# Patient Record
Sex: Female | Born: 1970 | Race: Black or African American | Hispanic: No | Marital: Single | Smoking: Former smoker
Health system: Southern US, Community
[De-identification: ages and names within clinical notes are randomized; demographics above are authoritative.]

## PROBLEM LIST (undated history)

## (undated) DIAGNOSIS — J45909 Unspecified asthma, uncomplicated: Secondary | ICD-10-CM

## (undated) DIAGNOSIS — R0600 Dyspnea, unspecified: Secondary | ICD-10-CM

## (undated) DIAGNOSIS — M1990 Unspecified osteoarthritis, unspecified site: Secondary | ICD-10-CM

## (undated) DIAGNOSIS — M549 Dorsalgia, unspecified: Secondary | ICD-10-CM

## (undated) DIAGNOSIS — M7918 Myalgia, other site: Secondary | ICD-10-CM

## (undated) DIAGNOSIS — C50919 Malignant neoplasm of unspecified site of unspecified female breast: Secondary | ICD-10-CM

## (undated) DIAGNOSIS — Z923 Personal history of irradiation: Secondary | ICD-10-CM

## (undated) DIAGNOSIS — I10 Essential (primary) hypertension: Secondary | ICD-10-CM

## (undated) DIAGNOSIS — Z9221 Personal history of antineoplastic chemotherapy: Secondary | ICD-10-CM

## (undated) HISTORY — DX: Dorsalgia, unspecified: M54.9

## (undated) HISTORY — PX: APPENDECTOMY: SHX54

## (undated) HISTORY — DX: Myalgia, other site: M79.18

## (undated) HISTORY — PX: ABDOMINAL HYSTERECTOMY: SHX81

---

## 2010-10-27 ENCOUNTER — Emergency Department (HOSPITAL_COMMUNITY): Admission: EM | Admit: 2010-10-27 | Discharge: 2010-10-27 | Payer: Self-pay | Admitting: Emergency Medicine

## 2012-10-04 ENCOUNTER — Other Ambulatory Visit (HOSPITAL_COMMUNITY): Payer: Self-pay | Admitting: *Deleted

## 2012-10-04 DIAGNOSIS — Z139 Encounter for screening, unspecified: Secondary | ICD-10-CM

## 2012-10-13 ENCOUNTER — Ambulatory Visit (HOSPITAL_COMMUNITY)
Admission: RE | Admit: 2012-10-13 | Discharge: 2012-10-13 | Disposition: A | Discharge status: Home / Self Care | Payer: Medicaid Other | Source: Ambulatory Visit | Attending: *Deleted | Admitting: *Deleted

## 2012-10-13 DIAGNOSIS — Z139 Encounter for screening, unspecified: Secondary | ICD-10-CM

## 2012-10-13 DIAGNOSIS — Z1231 Encounter for screening mammogram for malignant neoplasm of breast: Secondary | ICD-10-CM | POA: Insufficient documentation

## 2013-09-10 ENCOUNTER — Other Ambulatory Visit (HOSPITAL_COMMUNITY): Payer: Self-pay | Admitting: *Deleted

## 2013-09-10 DIAGNOSIS — Z139 Encounter for screening, unspecified: Secondary | ICD-10-CM

## 2013-10-15 ENCOUNTER — Ambulatory Visit (HOSPITAL_COMMUNITY)
Admission: RE | Admit: 2013-10-15 | Discharge: 2013-10-15 | Disposition: A | Discharge status: Home / Self Care | Payer: Medicaid Other | Source: Ambulatory Visit | Attending: *Deleted | Admitting: *Deleted

## 2013-10-15 DIAGNOSIS — Z1231 Encounter for screening mammogram for malignant neoplasm of breast: Secondary | ICD-10-CM | POA: Insufficient documentation

## 2013-10-15 DIAGNOSIS — Z139 Encounter for screening, unspecified: Secondary | ICD-10-CM

## 2013-10-20 ENCOUNTER — Encounter (HOSPITAL_COMMUNITY): Payer: Self-pay | Admitting: Emergency Medicine

## 2013-10-20 ENCOUNTER — Emergency Department (HOSPITAL_COMMUNITY)
Admission: EM | Admit: 2013-10-20 | Discharge: 2013-10-20 | Disposition: A | Discharge status: Home / Self Care | Payer: Medicaid Other | Attending: Emergency Medicine | Admitting: Emergency Medicine

## 2013-10-20 DIAGNOSIS — M5431 Sciatica, right side: Secondary | ICD-10-CM

## 2013-10-20 DIAGNOSIS — I10 Essential (primary) hypertension: Secondary | ICD-10-CM | POA: Insufficient documentation

## 2013-10-20 DIAGNOSIS — F17200 Nicotine dependence, unspecified, uncomplicated: Secondary | ICD-10-CM | POA: Insufficient documentation

## 2013-10-20 DIAGNOSIS — M5430 Sciatica, unspecified side: Secondary | ICD-10-CM | POA: Insufficient documentation

## 2013-10-20 HISTORY — DX: Essential (primary) hypertension: I10

## 2013-10-20 MED ORDER — CYCLOBENZAPRINE HCL 10 MG PO TABS
10.0000 mg | ORAL_TABLET | Freq: Once | ORAL | Status: AC
  Administered 2013-10-20: 10 mg via ORAL
  Filled 2013-10-20: qty 1

## 2013-10-20 MED ORDER — CYCLOBENZAPRINE HCL 10 MG PO TABS: 10.0000 mg | ORAL_TABLET | Freq: Two times a day (BID) | ORAL | Status: DC | PRN

## 2013-10-20 NOTE — ED Provider Notes (Signed)
CSN: [id number]     Arrival date & time 10/20/13  1157 History   First MD Initiated Contact with Patient 10/20/13 1216     Chief Complaint  Patient presents with  . Leg Pain   (Consider location/radiation/quality/duration/timing/severity/associated sxs/prior Treatment) Patient is a 42 y.o. female presenting with leg pain. The history is provided by the patient.  Leg Pain Location:  Buttock Time since incident:  2 weeks Injury: no   Buttock location:  L buttock and R buttock Pain details:    Radiates to:  L leg and R leg   Severity:  Moderate   Onset quality:  Gradual   Duration:  2 weeks   Timing:  Constant   Progression:  Worsening Chronicity:  New Dislocation: no   Foreign body present:  No foreign bodies Prior injury to area:  No Relieved by:  None tried Worsened by:  Activity and bearing weight Ineffective treatments:  None tried Associated symptoms: back pain   Associated symptoms: no decreased ROM    Monica [name] is a 42 y.o. female who presents to the ED with bilateral buttock pain that radiates to the legs. She saw her PCP yesterday and is to see a specialist. They are to call her on Monday with the arrangement.   Past Medical History  Diagnosis Date  . Hypertension    Past Surgical History  Procedure Laterality Date  . Abdominal hysterectomy     History reviewed. No pertinent family history. History  Substance Use Topics  . Smoking status: Current Every Day Smoker -- 0.50 packs/day  . Smokeless tobacco: Not on file  . Alcohol Use: No   OB History   Grav Para Term Preterm Abortions TAB SAB Ect Mult Living                 Review of Systems  Musculoskeletal: Positive for back pain.   Negative except as stated in HPI Allergies  Review of patient's allergies indicates no known allergies.  Home Medications  No current outpatient prescriptions on file. BP 107/80  Pulse 87  Temp(Src) 98.9 F (37.2 C) (Oral)  Wt 183 lb (83.008 kg)  SpO2  100% Physical Exam  Nursing note and vitals reviewed. Constitutional: She is oriented to person, place, and time. She appears well-developed and well-nourished.  HENT:  Head: Normocephalic and atraumatic.  Eyes: Conjunctivae and EOM are normal. Pupils are equal, round, and reactive to light.  Neck: Normal range of motion. Neck supple.  Cardiovascular: Normal rate and regular rhythm.   Pulmonary/Chest: Effort normal and breath sounds normal.  Abdominal: Soft. There is no tenderness.  Musculoskeletal:       Lumbar back: She exhibits spasm. She exhibits normal range of motion.       Back:  Tender with palpation bilateral sciatic nerve area with radiation to the posterior thighs. Pedal pulses equal, adequate circulation, good touch sensation.  Neurological: She is alert and oriented to person, place, and time. She has normal strength and normal reflexes. No cranial nerve deficit or sensory deficit. Gait normal.  Skin: Skin is warm and dry.  Psychiatric: She has a normal mood and affect. Her behavior is normal.    ED Course  Procedures  After Flexeril 10 mg PO the patient states she is pain free and can walk without difficulty.  MDM  42 y.o. female with sciatica. Will treat with muscle relaxant and she will take ibuprofen. She will follow up as scheduled.  Discussed with the patient and all  questioned fully answered. She will return if any problems arise.    Medication List         cyclobenzaprine 10 MG tablet  Commonly known as:  FLEXERIL  Take 1 tablet (10 mg total) by mouth 2 (two) times daily as needed for muscle spasms.           Janne Napoleon, NP 10/21/13 [phone]

## 2013-10-20 NOTE — ED Notes (Signed)
Bilateral leg pain times 2 weeks.  Seen family doctor Friday.  Pain is worse now.

## 2013-10-23 NOTE — ED Provider Notes (Signed)
Medical screening examination/treatment/procedure(s) were performed by non-physician practitioner and as supervising physician I was immediately available for consultation/collaboration.  EKG Interpretation   None         Yoel Kaufhold L Amear Strojny, MD 10/23/13 1108 

## 2014-08-02 ENCOUNTER — Other Ambulatory Visit (HOSPITAL_COMMUNITY): Payer: Self-pay | Admitting: *Deleted

## 2014-08-02 DIAGNOSIS — Z1231 Encounter for screening mammogram for malignant neoplasm of breast: Secondary | ICD-10-CM

## 2014-10-16 ENCOUNTER — Ambulatory Visit (HOSPITAL_COMMUNITY)
Admission: RE | Admit: 2014-10-16 | Discharge: 2014-10-16 | Disposition: A | Discharge status: Home / Self Care | Payer: Medicaid Other | Source: Ambulatory Visit | Attending: *Deleted | Admitting: *Deleted

## 2014-10-16 DIAGNOSIS — Z1231 Encounter for screening mammogram for malignant neoplasm of breast: Secondary | ICD-10-CM | POA: Insufficient documentation

## 2015-04-15 ENCOUNTER — Other Ambulatory Visit (HOSPITAL_COMMUNITY)
Admission: RE | Admit: 2015-04-15 | Discharge: 2015-04-15 | Disposition: A | Discharge status: Home / Self Care | Payer: Medicaid Other | Source: Ambulatory Visit | Attending: Unknown Physician Specialty | Admitting: Unknown Physician Specialty

## 2015-04-15 DIAGNOSIS — R87619 Unspecified abnormal cytological findings in specimens from cervix uteri: Secondary | ICD-10-CM | POA: Insufficient documentation

## 2015-04-18 LAB — CYTOLOGY - PAP

## 2015-08-14 ENCOUNTER — Encounter (HOSPITAL_COMMUNITY): Payer: Self-pay | Admitting: Emergency Medicine

## 2015-08-14 ENCOUNTER — Emergency Department (HOSPITAL_COMMUNITY)
Admission: EM | Admit: 2015-08-14 | Discharge: 2015-08-14 | Disposition: A | Discharge status: Home / Self Care | Payer: Medicaid Other | Attending: Emergency Medicine | Admitting: Emergency Medicine

## 2015-08-14 DIAGNOSIS — I10 Essential (primary) hypertension: Secondary | ICD-10-CM | POA: Insufficient documentation

## 2015-08-14 DIAGNOSIS — Z7982 Long term (current) use of aspirin: Secondary | ICD-10-CM | POA: Insufficient documentation

## 2015-08-14 DIAGNOSIS — Z79899 Other long term (current) drug therapy: Secondary | ICD-10-CM | POA: Insufficient documentation

## 2015-08-14 DIAGNOSIS — H9201 Otalgia, right ear: Secondary | ICD-10-CM | POA: Insufficient documentation

## 2015-08-14 DIAGNOSIS — Z720 Tobacco use: Secondary | ICD-10-CM | POA: Insufficient documentation

## 2015-08-14 MED ORDER — NEOMYCIN-POLYMYXIN-HC 1 % OT SOLN
4.0000 [drp] | Freq: Once | OTIC | Status: AC
  Administered 2015-08-14: 4 [drp] via OTIC
  Filled 2015-08-14: qty 10

## 2015-08-14 MED ORDER — ACETAMINOPHEN-CODEINE #3 300-30 MG PO TABS: 1.0000 | ORAL_TABLET | Freq: Four times a day (QID) | ORAL | Status: DC | PRN

## 2015-08-14 MED ORDER — CIPROFLOXACIN HCL 250 MG PO TABS
500.0000 mg | ORAL_TABLET | Freq: Once | ORAL | Status: AC
  Administered 2015-08-14: 500 mg via ORAL
  Filled 2015-08-14: qty 2

## 2015-08-14 MED ORDER — CIPROFLOXACIN HCL 500 MG PO TABS: 500.0000 mg | ORAL_TABLET | Freq: Two times a day (BID) | ORAL | Status: DC

## 2015-08-14 NOTE — ED Notes (Signed)
Pt verbalized understanding of no driving and to use caution within 4 hours of taking pain meds due to meds cause drowsiness 

## 2015-08-14 NOTE — ED Notes (Signed)
Having right ear pain, rates pain 8/10.

## 2015-08-14 NOTE — ED Provider Notes (Signed)
CSN: [id number]     Arrival date & time 08/14/15  1636 History   First MD Initiated Contact with Patient 08/14/15 1811     Chief Complaint  Patient presents with  . Otalgia    right     (Consider location/radiation/quality/duration/timing/severity/associated sxs/prior Treatment) HPI Comments: Patient presents to the emergency department with right ear pain. The patient complains of pain just outside the tragus area. She denies any recent injury or trauma. She states she has not been using excessive Q-tips. She has not been swimming. She's not had drainage from the ear. She states that it is probably been 8 or 9 years that she's had any form of an ear type infection. She denies recent sore throat, runny nose, or upper respiratory symptoms.  The history is provided by the patient.    Past Medical History  Diagnosis Date  . Hypertension    Past Surgical History  Procedure Laterality Date  . Abdominal hysterectomy     History reviewed. No pertinent family history. Social History  Substance Use Topics  . Smoking status: Current Every Day Smoker -- 0.50 packs/day  . Smokeless tobacco: None  . Alcohol Use: No   OB History    No data available     Review of Systems  HENT: Positive for ear pain.   All other systems reviewed and are negative.     Allergies  Review of patient's allergies indicates no known allergies.  Home Medications   Prior to Admission medications   Medication Sig Start Date End Date Taking? Authorizing Provider  aspirin EC 81 MG tablet Take 81 mg by mouth daily.   Yes Historical Provider, MD  lisinopril (PRINIVIL,ZESTRIL) 20 MG tablet Take 20 mg by mouth daily.   Yes Historical Provider, MD  cyclobenzaprine (FLEXERIL) 10 MG tablet Take 1 tablet (10 mg total) by mouth 2 (two) times daily as needed for muscle spasms. Patient not taking: Reported on 08/14/2015 10/20/13   Hope Bunnie Pion, NP   BP 137/92 mmHg  Pulse 72  Temp(Src) 98.5 F (36.9 C) (Oral)  Resp  18  Ht 5\' 3"  (1.6 m)  Wt 170 lb (77.111 kg)  BMI 30.12 kg/m2  SpO2 100% Physical Exam  Constitutional: She is oriented to person, place, and time. She appears well-developed and well-nourished.  Non-toxic appearance.  HENT:  Head: Normocephalic.  Right Ear: Tympanic membrane and external ear normal.  Left Ear: Tympanic membrane and external ear normal.  There is slight increased redness of the inner aspect of the right tragus. There is tenderness to touch of the tragus and adjacent to the tragus area. There is no swelling or tenderness near the salivary glands. There is no postauricular nodes appreciated. There is slight increase swelling of the external auditory canal. The right postauricular area, preauricular area, and external auditory canal are well within normal limits.  Eyes: EOM and lids are normal. Pupils are equal, round, and reactive to light.  Neck: Normal range of motion. Neck supple. Carotid bruit is not present.  Cardiovascular: Normal rate, regular rhythm, normal heart sounds, intact distal pulses and normal pulses.   Pulmonary/Chest: Breath sounds normal. No respiratory distress.  Abdominal: Soft. Bowel sounds are normal. There is no tenderness. There is no guarding.  Musculoskeletal: Normal range of motion.  Lymphadenopathy:       Head (right side): No submandibular adenopathy present.       Head (left side): No submandibular adenopathy present.    She has no cervical adenopathy.  Neurological: She is alert and oriented to person, place, and time. She has normal strength. No cranial nerve deficit or sensory deficit.  Skin: Skin is warm and dry.  Psychiatric: She has a normal mood and affect. Her speech is normal.  Nursing note and vitals reviewed.   ED Course  Procedures (including critical care time) Labs Review Labs Reviewed - No data to display  Imaging Review No results found. I have personally reviewed and evaluated these images and lab results as part of my  medical decision-making.   EKG Interpretation None      MDM  Vital signs are well within normal limits. There appears to be an infection of the outer portion of the ear involving the tragus, and portion of the external auditory canal. The patient is having 8/10 pain. Will treat with Tylenol codeine and Cipro. Patient is also given Cortisporin otic suspension to use for the next 5-7 days.    Final diagnoses:  None    *I have reviewed nursing notes, vital signs, and all appropriate lab and imaging results for this patient.[address] PA-C 08/14/15 1928  Noemi Chapel, MD 08/15/15 [phone]

## 2015-08-14 NOTE — Discharge Instructions (Signed)
Please use four  Cortisporin ear drops to the right ear 4 times daily. Please use Cipro 2 times daily with food. Use Tylenol or ibuprofen for mild pain, use Tylenol codeine for more severe pain.

## 2015-09-19 ENCOUNTER — Other Ambulatory Visit (HOSPITAL_COMMUNITY): Payer: Self-pay | Admitting: *Deleted

## 2015-10-21 ENCOUNTER — Other Ambulatory Visit (HOSPITAL_COMMUNITY): Payer: Self-pay | Admitting: *Deleted

## 2015-10-21 DIAGNOSIS — Z1231 Encounter for screening mammogram for malignant neoplasm of breast: Secondary | ICD-10-CM

## 2015-11-10 ENCOUNTER — Ambulatory Visit (HOSPITAL_COMMUNITY)
Admission: RE | Admit: 2015-11-10 | Discharge: 2015-11-10 | Disposition: A | Discharge status: Home / Self Care | Payer: PRIVATE HEALTH INSURANCE | Source: Ambulatory Visit | Attending: *Deleted | Admitting: *Deleted

## 2015-11-10 DIAGNOSIS — Z1231 Encounter for screening mammogram for malignant neoplasm of breast: Secondary | ICD-10-CM | POA: Diagnosis present

## 2016-10-19 ENCOUNTER — Other Ambulatory Visit (HOSPITAL_COMMUNITY): Payer: Self-pay | Admitting: *Deleted

## 2016-10-19 DIAGNOSIS — Z1231 Encounter for screening mammogram for malignant neoplasm of breast: Secondary | ICD-10-CM

## 2016-11-11 ENCOUNTER — Ambulatory Visit (HOSPITAL_COMMUNITY)
Admission: RE | Admit: 2016-11-11 | Discharge: 2016-11-11 | Disposition: A | Discharge status: Home / Self Care | Payer: PRIVATE HEALTH INSURANCE | Source: Ambulatory Visit | Attending: *Deleted | Admitting: *Deleted

## 2016-11-11 DIAGNOSIS — Z1231 Encounter for screening mammogram for malignant neoplasm of breast: Secondary | ICD-10-CM

## 2017-08-01 ENCOUNTER — Emergency Department (HOSPITAL_COMMUNITY)
Admission: EM | Admit: 2017-08-01 | Discharge: 2017-08-01 | Disposition: A | Discharge status: Home / Self Care | Payer: PRIVATE HEALTH INSURANCE | Attending: Emergency Medicine | Admitting: Emergency Medicine

## 2017-08-01 ENCOUNTER — Encounter (HOSPITAL_COMMUNITY): Payer: Self-pay | Admitting: Adult Health

## 2017-08-01 DIAGNOSIS — F17210 Nicotine dependence, cigarettes, uncomplicated: Secondary | ICD-10-CM | POA: Insufficient documentation

## 2017-08-01 DIAGNOSIS — J40 Bronchitis, not specified as acute or chronic: Secondary | ICD-10-CM

## 2017-08-01 DIAGNOSIS — Z79899 Other long term (current) drug therapy: Secondary | ICD-10-CM | POA: Insufficient documentation

## 2017-08-01 MED ORDER — PREDNISONE 20 MG PO TABS: 40.0000 mg | ORAL_TABLET | Freq: Every day | ORAL | 0 refills | Status: DC

## 2017-08-01 MED ORDER — AZITHROMYCIN 250 MG PO TABS: ORAL_TABLET | ORAL | 0 refills | Status: DC

## 2017-08-01 MED ORDER — ALBUTEROL SULFATE (2.5 MG/3ML) 0.083% IN NEBU
2.5000 mg | INHALATION_SOLUTION | Freq: Once | RESPIRATORY_TRACT | Status: AC
Start: 2017-08-01 — End: 2017-08-01
  Administered 2017-08-01: 2.5 mg via RESPIRATORY_TRACT
  Filled 2017-08-01: qty 3

## 2017-08-01 MED ORDER — ALBUTEROL SULFATE HFA 108 (90 BASE) MCG/ACT IN AERS
2.0000 | INHALATION_SPRAY | Freq: Once | RESPIRATORY_TRACT | Status: AC
  Administered 2017-08-01: 2 via RESPIRATORY_TRACT
  Filled 2017-08-01: qty 6.7

## 2017-08-01 MED ORDER — IPRATROPIUM-ALBUTEROL 0.5-2.5 (3) MG/3ML IN SOLN
3.0000 mL | Freq: Once | RESPIRATORY_TRACT | Status: AC
  Administered 2017-08-01: 3 mL via RESPIRATORY_TRACT
  Filled 2017-08-01: qty 3

## 2017-08-01 MED ORDER — PREDNISONE 10 MG PO TABS
60.0000 mg | ORAL_TABLET | Freq: Once | ORAL | Status: AC
Start: 2017-08-01 — End: 2017-08-01
  Administered 2017-08-01: 60 mg via ORAL
  Filled 2017-08-01: qty 1

## 2017-08-01 NOTE — Discharge Instructions (Signed)
2 puffs of albuterol inhaler 4 times a day as needed. Start the prednisone prescription tomorrow. Follow-up with your primary doctor or return here for any worsening symptoms

## 2017-08-01 NOTE — ED Triage Notes (Signed)
Presetns with sore throat, cough, nasal congestion, and headache that began Friday. HAs not tried any OTC medications at home. Endorses productive cough with green sputum and frontal head pain.

## 2017-08-01 NOTE — ED Provider Notes (Signed)
[facility] DEPT Provider Note   CSN: [id number] Arrival date & time: 08/01/17  1007     History   Chief Complaint Chief Complaint  Patient presents with  . URI    HPI Monica [name] is a 46 y.o. female.  HPI   Monica [name] is a 46 y.o. female who presents to the Emergency Department complaining of sore throat, nasal congestion, cough and frontal headache. Symptoms began 3 days ago after mowing her lawn. She states that her nose feels "stopped up" and reports clear rhinorrhea. Cough has been productive of green sputum and she endorses intermittent wheezing. States that she typically takes allergy medications but has recently ran out. She denies chest pain, shortness of breath, abdominal pain or vomiting, fever or chills.    Past Medical History:  Diagnosis Date  . Hypertension     There are no active problems to display for this patient.   Past Surgical History:  Procedure Laterality Date  . ABDOMINAL HYSTERECTOMY      OB History    No data available       Home Medications    Prior to Admission medications   Medication Sig Start Date End Date Taking? Authorizing Provider  acetaminophen-codeine (TYLENOL #3) 300-30 MG per tablet Take 1-2 tablets by mouth every 6 (six) hours as needed for moderate pain. 08/14/15   Lily Kocher, PA-C  aspirin EC 81 MG tablet Take 81 mg by mouth daily.    [provider]  ciprofloxacin (CIPRO) 500 MG tablet Take 1 tablet (500 mg total) by mouth 2 (two) times daily. 08/14/15   Lily Kocher, PA-C  cyclobenzaprine (FLEXERIL) 10 MG tablet Take 1 tablet (10 mg total) by mouth 2 (two) times daily as needed for muscle spasms. Patient not taking: Reported on 08/14/2015 10/20/13   Ashley Murrain, NP  lisinopril (PRINIVIL,ZESTRIL) 20 MG tablet Take 20 mg by mouth daily.    [provider]    Family History History reviewed. No pertinent family history.  Social History Social History  Substance Use Topics  .  Smoking status: Current Every Day Smoker    Packs/day: 0.50  . Smokeless tobacco: Not on file  . Alcohol use No     Allergies   Patient has no known allergies.   Review of Systems Review of Systems  Constitutional: Negative for activity change, appetite change, chills and fever.  HENT: Positive for congestion, rhinorrhea, sinus pressure and sore throat. Negative for facial swelling and trouble swallowing.   Eyes: Negative for visual disturbance.  Respiratory: Positive for cough and wheezing. Negative for shortness of breath and stridor.   Gastrointestinal: Negative for abdominal pain, nausea and vomiting.  Genitourinary: Negative for dysuria.  Musculoskeletal: Negative for arthralgias, myalgias, neck pain and neck stiffness.  Skin: Negative.   Neurological: Negative for dizziness, weakness, numbness and headaches.  Hematological: Negative for adenopathy.  Psychiatric/Behavioral: Negative for confusion.  All other systems reviewed and are negative.    Physical Exam Updated Vital Signs BP 123/88   Pulse 77   Temp 98.3 F (36.8 C) (Oral)   Resp 20   SpO2 98%   Physical Exam  Constitutional: She is oriented to person, place, and time. She appears well-developed and well-nourished. No distress.  HENT:  Head: Normocephalic and atraumatic.  Right Ear: Tympanic membrane and ear canal normal.  Left Ear: Tympanic membrane and ear canal normal.  Nose: Mucosal edema and rhinorrhea present.  Mouth/Throat: Uvula is midline and mucous membranes are normal.  No trismus in the jaw. No uvula swelling. Posterior oropharyngeal erythema present. No oropharyngeal exudate, posterior oropharyngeal edema or tonsillar abscesses.  Eyes: Conjunctivae are normal.  Neck: Normal range of motion and phonation normal. Neck supple. No Brudzinski's sign and no Kernig's sign noted.  Cardiovascular: Normal rate, regular rhythm and intact distal pulses.   No murmur heard. Pulmonary/Chest: Effort normal. No  respiratory distress. She has wheezes. She has no rales.  Few scattered expiratory wheezes bilaterally. No rales  Abdominal: Soft. She exhibits no distension. There is no tenderness. There is no rebound and no guarding.  Musculoskeletal: She exhibits no edema or tenderness.  Lymphadenopathy:    She has no cervical adenopathy.  Neurological: She is alert and oriented to person, place, and time. She exhibits normal muscle tone. Coordination normal.  Skin: Skin is warm and dry.  Psychiatric: She has a normal mood and affect.  Nursing note and vitals reviewed.    ED Treatments / Results  Labs (all labs ordered are listed, but only abnormal results are displayed) Labs Reviewed - No data to display  EKG  EKG Interpretation None       Radiology No results found.   Procedures Procedures (including critical care time)  Medications Ordered in ED Medications  albuterol (PROVENTIL HFA;VENTOLIN HFA) 108 (90 Base) MCG/ACT inhaler 2 puff (not administered)  ipratropium-albuterol (DUONEB) 0.5-2.5 (3) MG/3ML nebulizer solution 3 mL (not administered)  albuterol (PROVENTIL) (2.5 MG/3ML) 0.083% nebulizer solution 2.5 mg (not administered)  predniSONE (DELTASONE) tablet 60 mg (not administered)     Initial Impression / Assessment and Plan / ED Course  I have reviewed the triage vital signs and the nursing notes.  Pertinent labs & imaging results that were available during my care of the patient were reviewed by me and considered in my medical decision making (see chart for details).     Patient nontoxic appearing. Vital signs are reassuring.  On recheck, Lung sounds improved after albuterol neb. Pt reports feeling better. PERC negative.  Pt appears stable for d/c.  Return precautions discussed.  Final Clinical Impressions(s) / ED Diagnoses   Final diagnoses:  Bronchitis    New Prescriptions New Prescriptions   No medications on file     Bufford Lope 08/03/17  2204    Fredia Sorrow, MD 08/10/17 [phone]

## 2018-02-14 ENCOUNTER — Other Ambulatory Visit (HOSPITAL_COMMUNITY): Payer: Self-pay | Admitting: *Deleted

## 2018-02-14 DIAGNOSIS — Z1231 Encounter for screening mammogram for malignant neoplasm of breast: Secondary | ICD-10-CM

## 2018-02-23 ENCOUNTER — Encounter (HOSPITAL_COMMUNITY): Payer: Self-pay

## 2018-02-23 ENCOUNTER — Ambulatory Visit (HOSPITAL_COMMUNITY)
Admission: RE | Admit: 2018-02-23 | Discharge: 2018-02-23 | Disposition: A | Discharge status: Home / Self Care | Payer: PRIVATE HEALTH INSURANCE | Source: Ambulatory Visit | Attending: *Deleted | Admitting: *Deleted

## 2018-02-23 DIAGNOSIS — Z1231 Encounter for screening mammogram for malignant neoplasm of breast: Secondary | ICD-10-CM | POA: Diagnosis present

## 2018-09-08 ENCOUNTER — Encounter (HOSPITAL_COMMUNITY): Payer: Self-pay | Admitting: Emergency Medicine

## 2018-09-08 ENCOUNTER — Emergency Department (HOSPITAL_COMMUNITY)
Admission: EM | Admit: 2018-09-08 | Discharge: 2018-09-08 | Disposition: A | Discharge status: Home / Self Care | Payer: Self-pay | Attending: Emergency Medicine | Admitting: Emergency Medicine

## 2018-09-08 ENCOUNTER — Other Ambulatory Visit: Payer: Self-pay

## 2018-09-08 ENCOUNTER — Emergency Department (HOSPITAL_COMMUNITY): Payer: Self-pay

## 2018-09-08 DIAGNOSIS — J208 Acute bronchitis due to other specified organisms: Secondary | ICD-10-CM | POA: Insufficient documentation

## 2018-09-08 DIAGNOSIS — F17210 Nicotine dependence, cigarettes, uncomplicated: Secondary | ICD-10-CM | POA: Insufficient documentation

## 2018-09-08 DIAGNOSIS — Z79899 Other long term (current) drug therapy: Secondary | ICD-10-CM | POA: Insufficient documentation

## 2018-09-08 DIAGNOSIS — I10 Essential (primary) hypertension: Secondary | ICD-10-CM | POA: Insufficient documentation

## 2018-09-08 DIAGNOSIS — Z7982 Long term (current) use of aspirin: Secondary | ICD-10-CM | POA: Insufficient documentation

## 2018-09-08 IMAGING — DX DG CHEST 2V
2 series · 2 of 2 positions shown · non-contrast
Comparison: [DATE]

CLINICAL DATA: Cough and wheezing

EXAM:
CHEST - 2 VIEW

[chest pa]
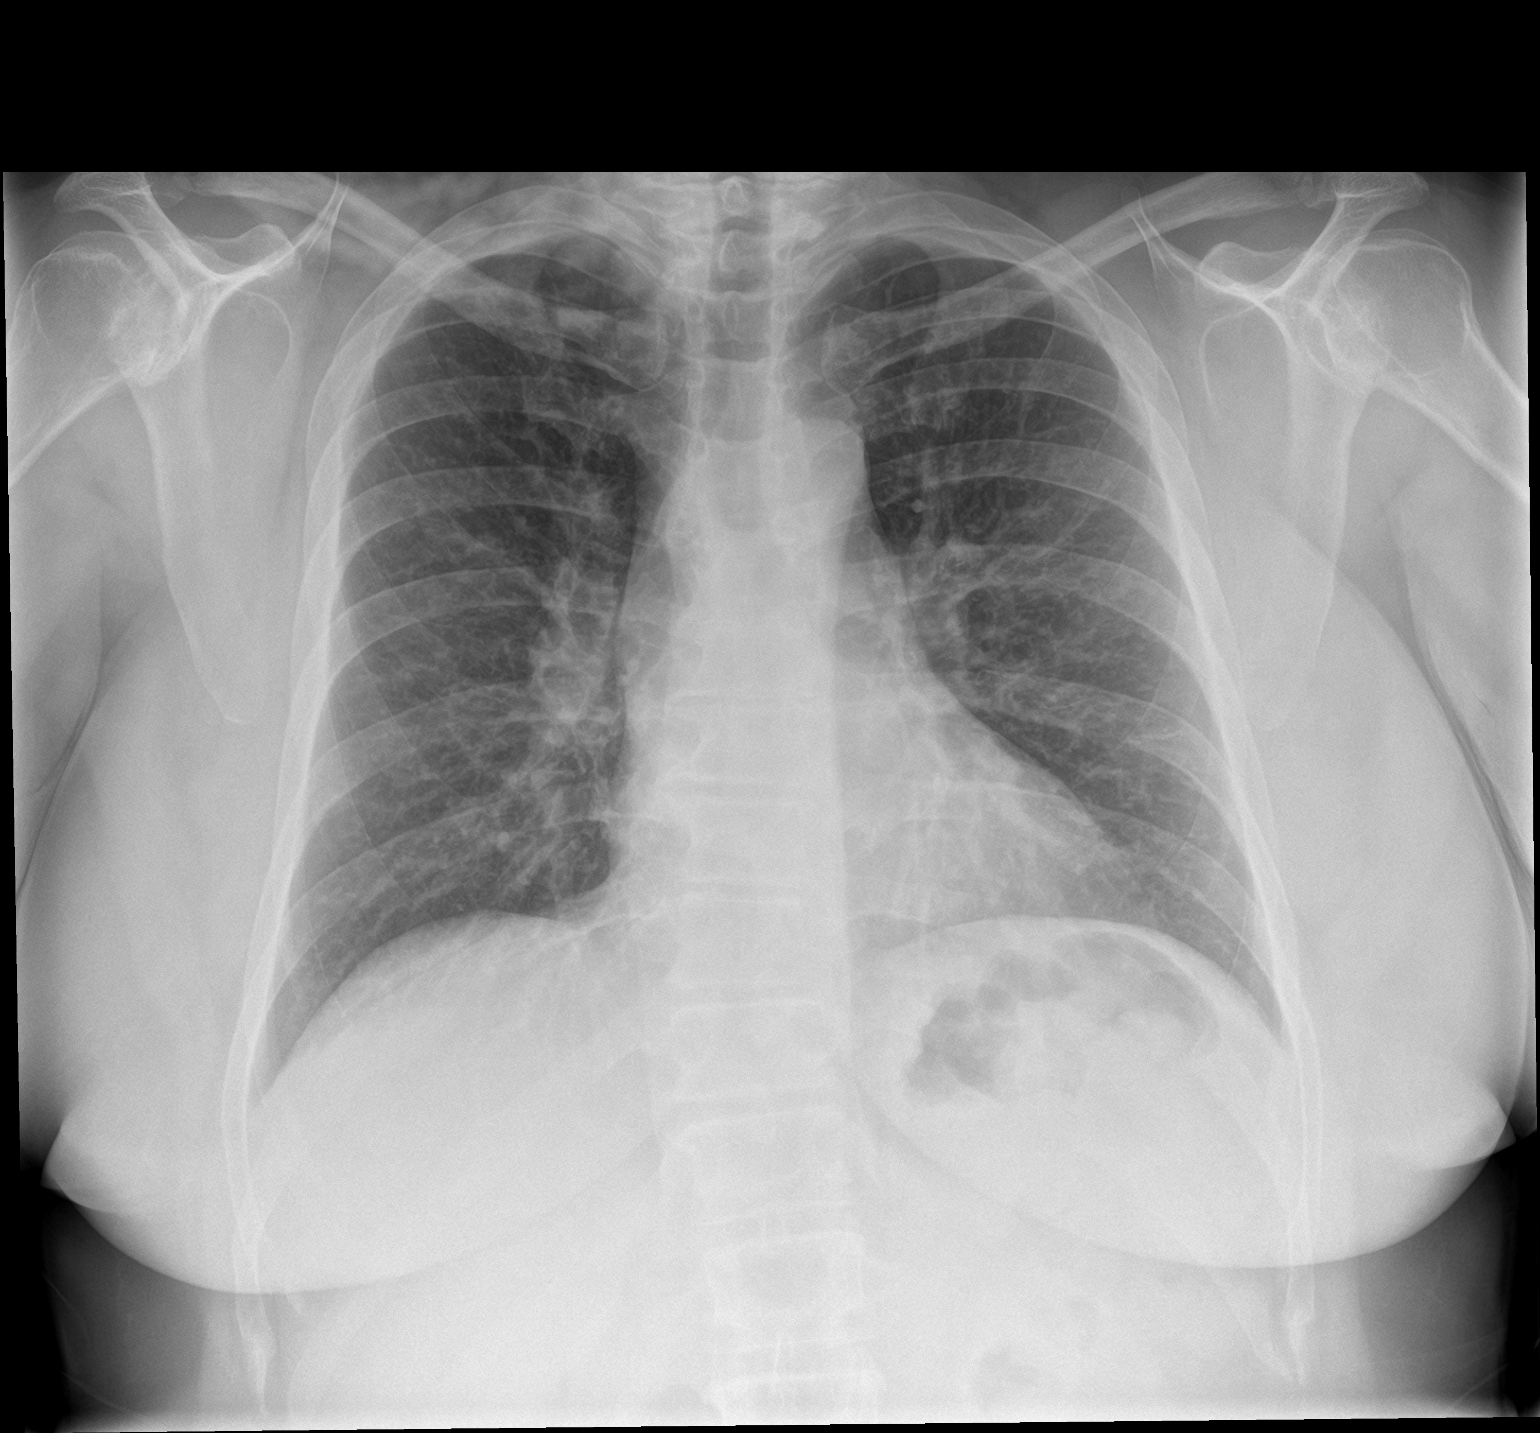

[chest lat]
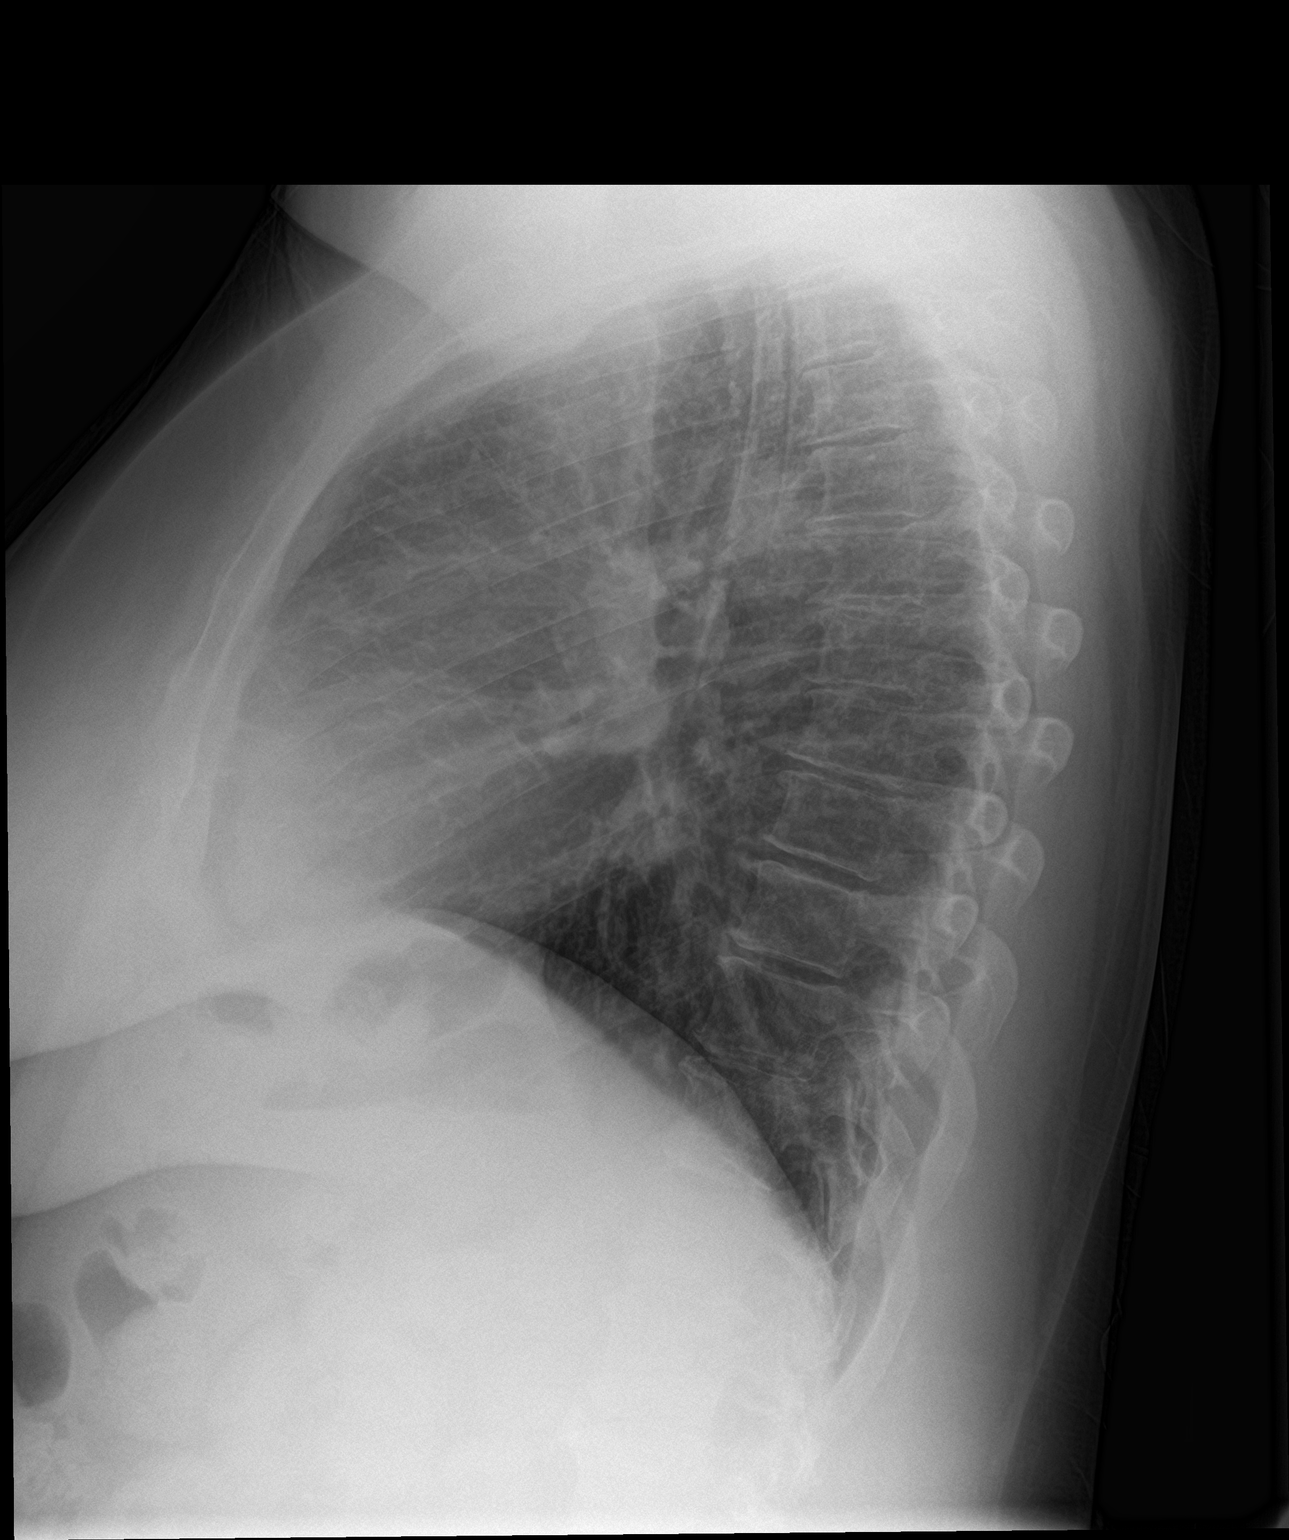

[2 of 2 positions shown; findings below may reference images not displayed]

FINDINGS: There is slight scarring in the left mid lung. Lungs elsewhere are
clear. Heart size and pulmonary vascularity are normal. No
adenopathy. No bone lesions.
IMPRESSION: Slight left midlung scarring. No edema or consolidation. Stable
cardiac silhouette.

## 2018-09-08 MED ORDER — PROMETHAZINE-DM 6.25-15 MG/5ML PO SYRP: 10.0000 mL | ORAL_SOLUTION | Freq: Four times a day (QID) | ORAL | 0 refills | Status: DC | PRN

## 2018-09-08 MED ORDER — PREDNISONE 20 MG PO TABS
40.0000 mg | ORAL_TABLET | Freq: Once | ORAL | Status: AC
  Administered 2018-09-08: 40 mg via ORAL
  Filled 2018-09-08: qty 2

## 2018-09-08 MED ORDER — AZITHROMYCIN 250 MG PO TABS: ORAL_TABLET | ORAL | 0 refills | Status: DC

## 2018-09-08 MED ORDER — IPRATROPIUM-ALBUTEROL 0.5-2.5 (3) MG/3ML IN SOLN
3.0000 mL | Freq: Once | RESPIRATORY_TRACT | Status: AC
  Administered 2018-09-08: 3 mL via RESPIRATORY_TRACT
  Filled 2018-09-08: qty 3

## 2018-09-08 MED ORDER — AZITHROMYCIN 250 MG PO TABS
500.0000 mg | ORAL_TABLET | Freq: Once | ORAL | Status: AC
  Administered 2018-09-08: 500 mg via ORAL
  Filled 2018-09-08: qty 2

## 2018-09-08 MED ORDER — DEXAMETHASONE 4 MG PO TABS: 4.0000 mg | ORAL_TABLET | Freq: Two times a day (BID) | ORAL | 0 refills | Status: DC

## 2018-09-08 NOTE — ED Provider Notes (Signed)
[facility] EMERGENCY DEPARTMENT Provider Note   CSN: [id number] Arrival date & time: 09/08/18  1046     History   Chief Complaint Chief Complaint  Patient presents with  . Cough    HPI Monica [name] is a 47 y.o. female.  Patient is a 47 year old female who presents to the emergency department with a complaint of cough and wheezing.  Patient states that these problems have been going on for about a week.  She has not had temperature elevation.  There is been no hemoptysis reported.  Patient is a smoker.  Patient denies having visited outside of the country recently.  No high fevers reported.  The patient denies any unusual rash.  She has not been around any noxious fumes or excessive smoke.  She presents because of cough and wheezing requesting additional evaluation and management of this issue.  The history is provided by the patient.  Cough  Associated symptoms include wheezing. Pertinent negatives include no chest pain and no shortness of breath.    Past Medical History:  Diagnosis Date  . Hypertension     There are no active problems to display for this patient.   Past Surgical History:  Procedure Laterality Date  . ABDOMINAL HYSTERECTOMY       OB History   None      Home Medications    Prior to Admission medications   Medication Sig Start Date End Date Taking? Authorizing Provider  acetaminophen-codeine (TYLENOL #3) 300-30 MG per tablet Take 1-2 tablets by mouth every 6 (six) hours as needed for moderate pain. Patient not taking: Reported on 08/01/2017 08/14/15   Lily Kocher, PA-C  aspirin EC 81 MG tablet Take 81 mg by mouth daily.    [provider]  azithromycin (ZITHROMAX) 250 MG tablet Take first 2 tablets together, then 1 every day until finished. 08/01/17   Triplett, Tammy, PA-C  ciprofloxacin (CIPRO) 500 MG tablet Take 1 tablet (500 mg total) by mouth 2 (two) times daily. Patient not taking: Reported on 08/01/2017 08/14/15   Lily Kocher, PA-C  cyclobenzaprine (FLEXERIL) 10 MG tablet Take 1 tablet (10 mg total) by mouth 2 (two) times daily as needed for muscle spasms. Patient not taking: Reported on 08/14/2015 10/20/13   Ashley Murrain, NP  lisinopril (PRINIVIL,ZESTRIL) 20 MG tablet Take 20 mg by mouth daily.    [provider]  predniSONE (DELTASONE) 20 MG tablet Take 2 tablets (40 mg total) by mouth daily with breakfast. 08/01/17   Triplett, Tammy, PA-C    Family History No family history on file.  Social History Social History   Tobacco Use  . Smoking status: Current Every Day Smoker    Packs/day: 0.50  . Smokeless tobacco: Never Used  Substance Use Topics  . Alcohol use: No  . Drug use: Never     Allergies   Patient has no known allergies.   Review of Systems Review of Systems  Constitutional: Negative for activity change.       All ROS Neg except as noted in HPI  HENT: Positive for congestion. Negative for nosebleeds.   Eyes: Negative for photophobia and discharge.  Respiratory: Positive for cough and wheezing. Negative for shortness of breath.   Cardiovascular: Negative for chest pain and palpitations.  Gastrointestinal: Negative for abdominal pain and blood in stool.  Genitourinary: Negative for dysuria, frequency and hematuria.  Musculoskeletal: Negative for arthralgias, back pain and neck pain.  Skin: Negative.   Neurological: Negative for dizziness, seizures  and speech difficulty.  Psychiatric/Behavioral: Negative for confusion and hallucinations.     Physical Exam Updated Vital Signs BP 136/83 (BP Location: Right Arm)   Pulse 81   Temp 98.2 F (36.8 C) (Oral)   Resp 20   Ht 5\' 4"  (1.626 m)   Wt 81.2 kg   SpO2 97%   BMI 30.73 kg/m   Physical Exam  Constitutional: She is oriented to person, place, and time. She appears well-developed and well-nourished.  Non-toxic appearance.  HENT:  Head: Normocephalic.  Right Ear: Tympanic membrane and external ear normal.  Left  Ear: Tympanic membrane and external ear normal.  Nasal congestion present.  Eyes: Pupils are equal, round, and reactive to light. EOM and lids are normal.  Neck: Normal range of motion. Neck supple. Carotid bruit is not present.  Cardiovascular: Normal rate, regular rhythm, normal heart sounds, intact distal pulses and normal pulses.  Pulmonary/Chest: No respiratory distress. She has wheezes.  Patient has symmetrical rise and fall of the chest.  The speech is clear.  Patient speaks in complete sentences.  There are soft inspiratory and expiratory wheezes present.  Abdominal: Soft. Bowel sounds are normal. There is no tenderness. There is no guarding.  Musculoskeletal: Normal range of motion.  Lymphadenopathy:       Head (right side): No submandibular adenopathy present.       Head (left side): No submandibular adenopathy present.    She has no cervical adenopathy.  Neurological: She is alert and oriented to person, place, and time. She has normal strength. No cranial nerve deficit or sensory deficit.  Skin: Skin is warm and dry.  Psychiatric: She has a normal mood and affect. Her speech is normal.  Nursing note and vitals reviewed.    ED Treatments / Results  Labs (all labs ordered are listed, but only abnormal results are displayed) Labs Reviewed - No data to display  EKG None  Radiology Dg Chest 2 View  Result Date: 09/08/2018 CLINICAL DATA:  Cough and wheezing EXAM: CHEST - 2 VIEW COMPARISON:  October 24, 2014 FINDINGS: There is slight scarring in the left mid lung. Lungs elsewhere are clear. Heart size and pulmonary vascularity are normal. No adenopathy. No bone lesions. IMPRESSION: Slight left midlung scarring. No edema or consolidation. Stable cardiac silhouette. Electronically Signed   By: Lowella Grip III M.D.   On: 09/08/2018 11:48    Procedures Procedures (including critical care time)  Medications Ordered in ED Medications  ipratropium-albuterol (DUONEB)  0.5-2.5 (3) MG/3ML nebulizer solution 3 mL (3 mLs Nebulization Given 09/08/18 1219)     Initial Impression / Assessment and Plan / ED Course  I have reviewed the triage vital signs and the nursing notes.  Pertinent labs & imaging results that were available during my care of the patient were reviewed by me and considered in my medical decision making (see chart for details).       Final Clinical Impressions(s) / ED Diagnoses MDM  Vital signs are within normal limits.  Pulse oximetry is 97% on room air.  Within normal limits by my interpretation.  The chest x-ray shows slight left midlung scarring, but no edema and no consolidation.  Examination favors acute bronchitis.  The patient is a smoker.  Patient treated with albuterol DuoNeb, with some improvement.  The patient will be given steroid medication here in the emergency department.  Prescription for Zithromax for possible atypical infection, as well as steroid medication will be given.  The patient states that she  has a new inhaler at home to use.  Patient advised to see the primary physician or return to the emergency department if not improving.  I have instructed the patient on the importance of her stop smoking.   Final diagnoses:  Acute bronchitis due to other specified organisms    ED Discharge Orders         Ordered    azithromycin (ZITHROMAX) 250 MG tablet     09/08/18 1244    dexamethasone (DECADRON) 4 MG tablet  2 times daily with meals     09/08/18 1244    promethazine-dextromethorphan (PROMETHAZINE-DM) 6.25-15 MG/5ML syrup  4 times daily PRN     09/08/18 1244           Lily Kocher, PA-C 09/08/18 1625    Milton Ferguson, MD 09/12/18 1558

## 2018-09-08 NOTE — ED Notes (Signed)
Pt feeling better after duo-neb

## 2018-09-08 NOTE — Discharge Instructions (Addendum)
Your vital signs are within normal limits.  Your oxygen level is 97% on room air.  Within normal limits by my interpretation.  Your examination suggest bronchitis and upper respiratory infection.  Please use Zithromax daily starting October 5.  Use Decadron 2 times daily with food.  Use Tylenol every 4 hours, or ibuprofen every 6 hours for fever or aching.  Use 2 puffs of albuterol every 4 hours.  It is important that you increase fluids, particularly water.  May use Promethazine DM for cough.  This medication may cause drowsiness, please do not drive a vehicle, operate machinery, drink alcohol, or participate in activities requiring concentration when taking this medication.  It is important that you stop smoking.  Please see your primary physician or return to the emergency department if any changes, problems, or concerns.

## 2018-09-08 NOTE — ED Notes (Signed)
Patient given discharge instruction, verbalized understand. Patient ambulatory out of the department.  

## 2018-09-08 NOTE — ED Triage Notes (Addendum)
Pt c/o of productive green cough x 1 week.  Wheezing noted. Pt denies being sob.

## 2019-06-12 ENCOUNTER — Other Ambulatory Visit (HOSPITAL_COMMUNITY): Payer: Self-pay | Admitting: *Deleted

## 2019-06-12 DIAGNOSIS — Z1231 Encounter for screening mammogram for malignant neoplasm of breast: Secondary | ICD-10-CM

## 2019-06-27 ENCOUNTER — Encounter (HOSPITAL_COMMUNITY): Payer: Self-pay

## 2019-06-27 ENCOUNTER — Other Ambulatory Visit: Payer: Self-pay

## 2019-06-27 ENCOUNTER — Ambulatory Visit (HOSPITAL_COMMUNITY)
Admission: RE | Admit: 2019-06-27 | Discharge: 2019-06-27 | Disposition: A | Discharge status: Home / Self Care | Payer: PRIVATE HEALTH INSURANCE | Source: Ambulatory Visit | Attending: *Deleted | Admitting: *Deleted

## 2019-06-27 ENCOUNTER — Ambulatory Visit (HOSPITAL_COMMUNITY): Payer: PRIVATE HEALTH INSURANCE

## 2019-06-27 DIAGNOSIS — Z1231 Encounter for screening mammogram for malignant neoplasm of breast: Secondary | ICD-10-CM | POA: Diagnosis present

## 2019-06-27 IMAGING — MG DIGITAL SCREENING BILATERAL MAMMOGRAM WITH TOMO AND CAD
8 series · 8 of 24 positions shown · non-contrast
Comparison: Previous exam(s).

CLINICAL DATA: Screening.

EXAM:
DIGITAL SCREENING BILATERAL MAMMOGRAM WITH TOMO AND CAD

[R CC synth-2D]
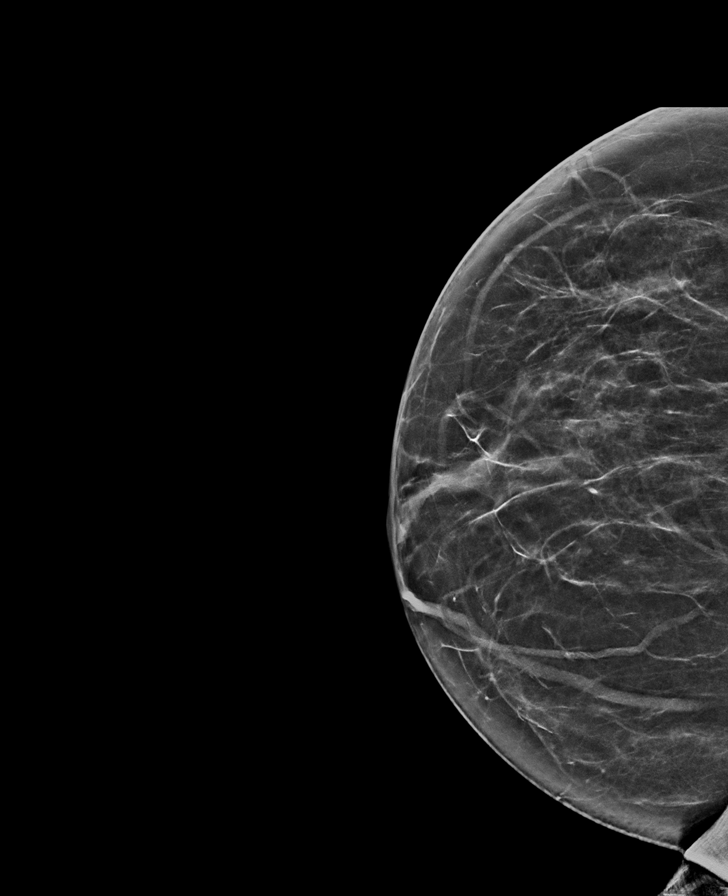

[L CC synth-2D]
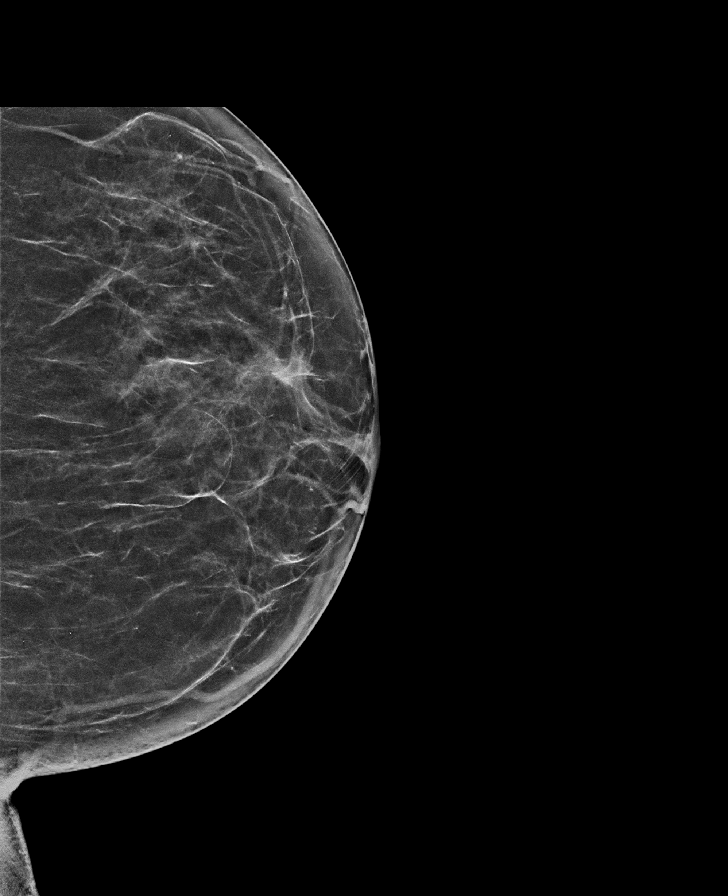

[L MLO synth-2D]
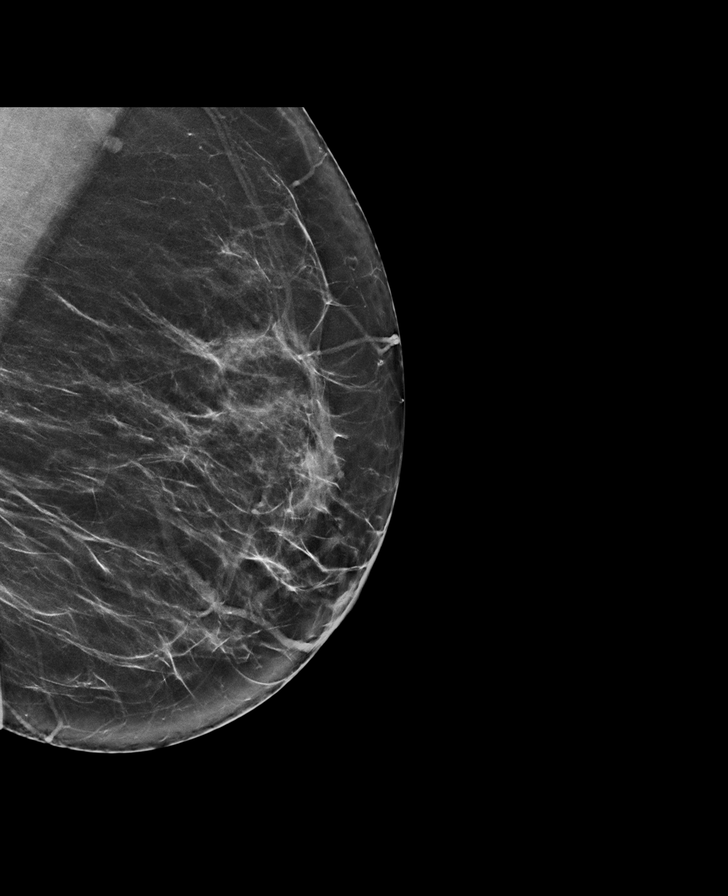

[R MLO synth-2D]
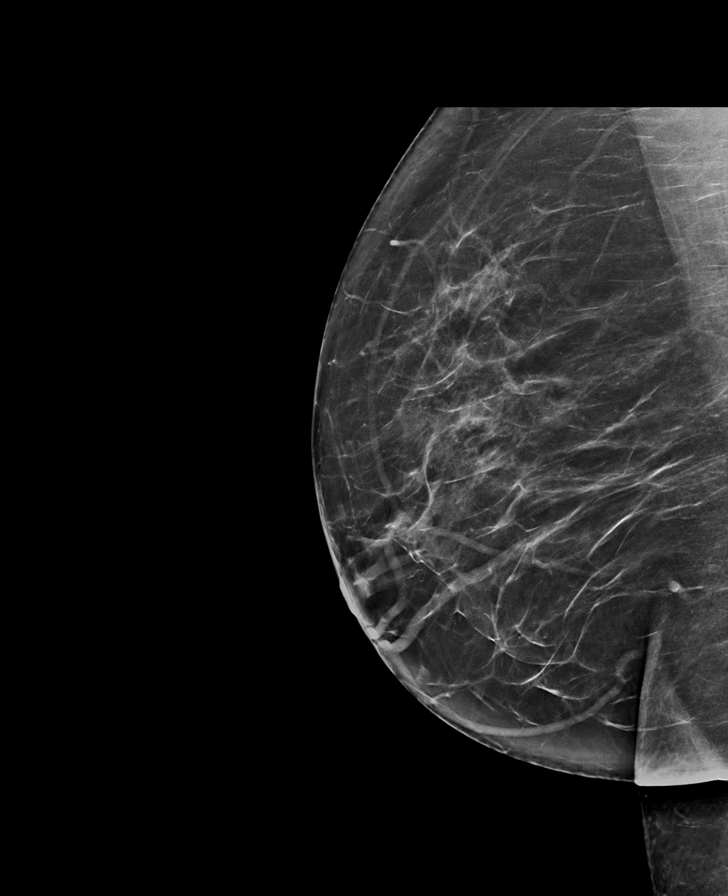

[R CC tomo · tomo slice 34/67.0]
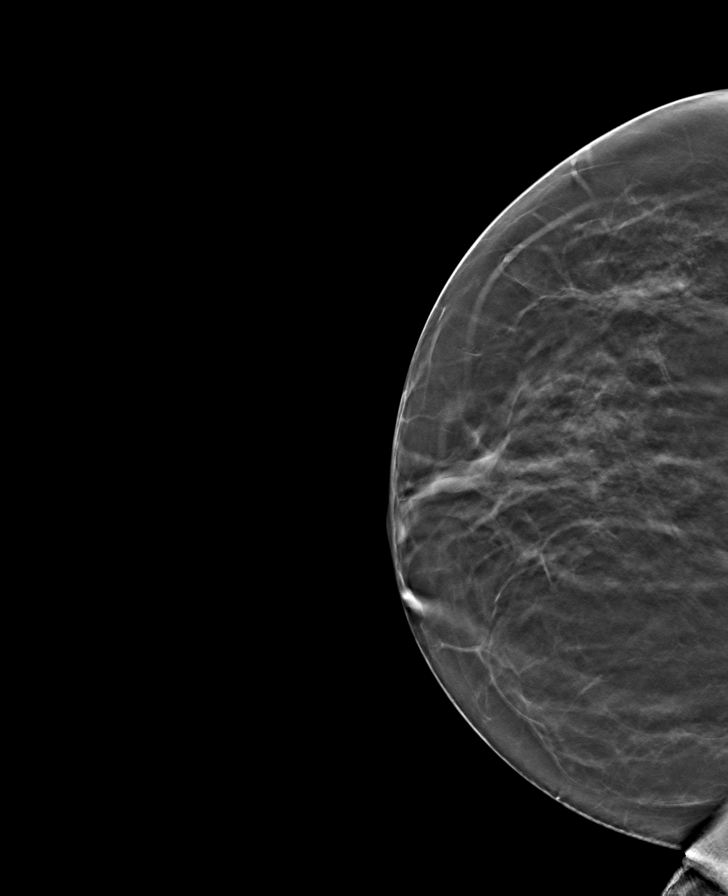

[R MLO tomo · tomo slice 40/79.0]
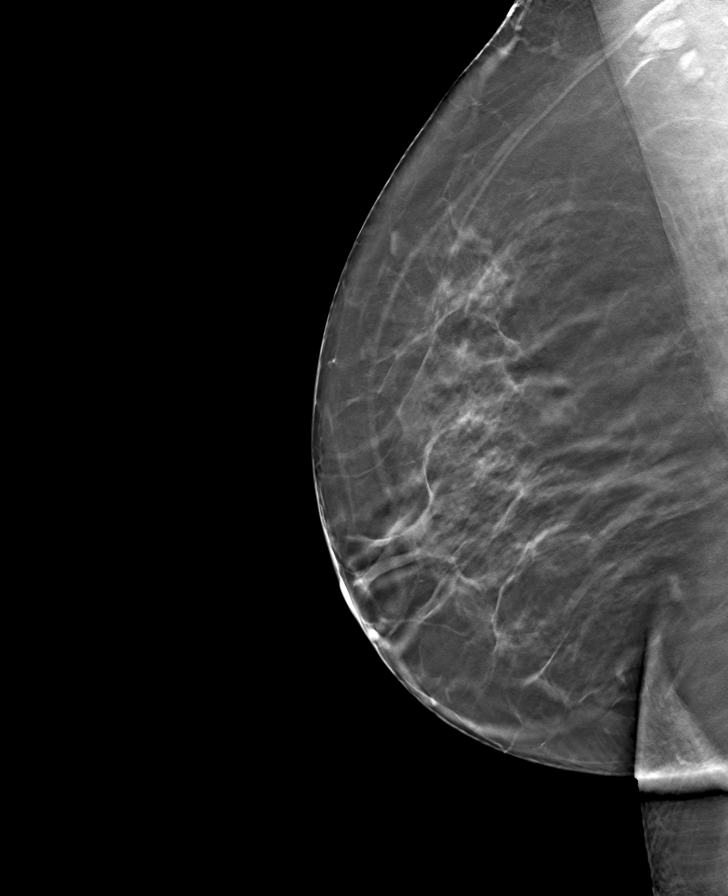

[L CC tomo · tomo slice 35/69.0]
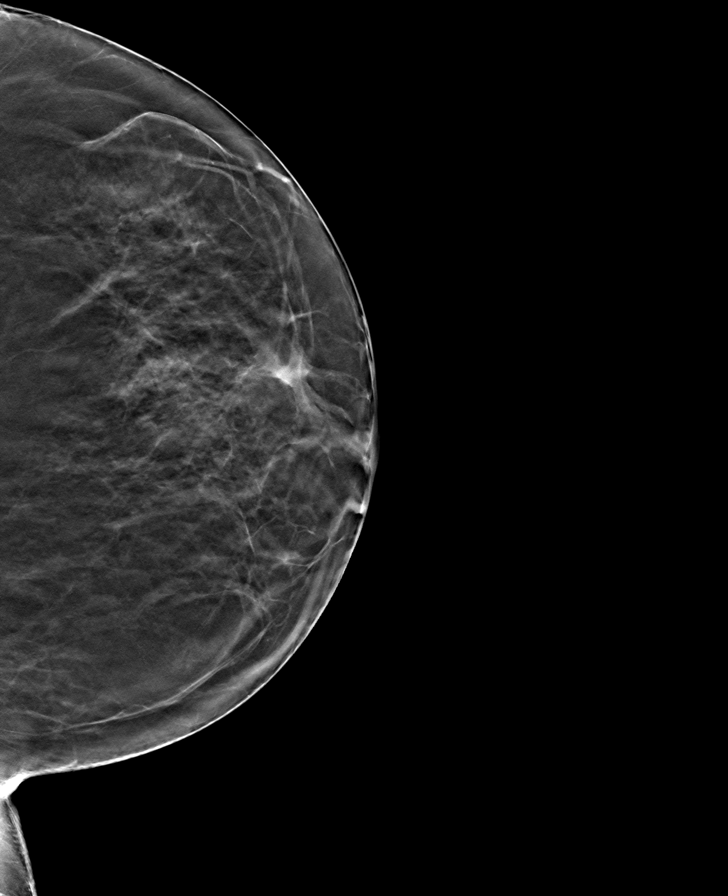

[L MLO tomo · tomo slice 40/79.0]
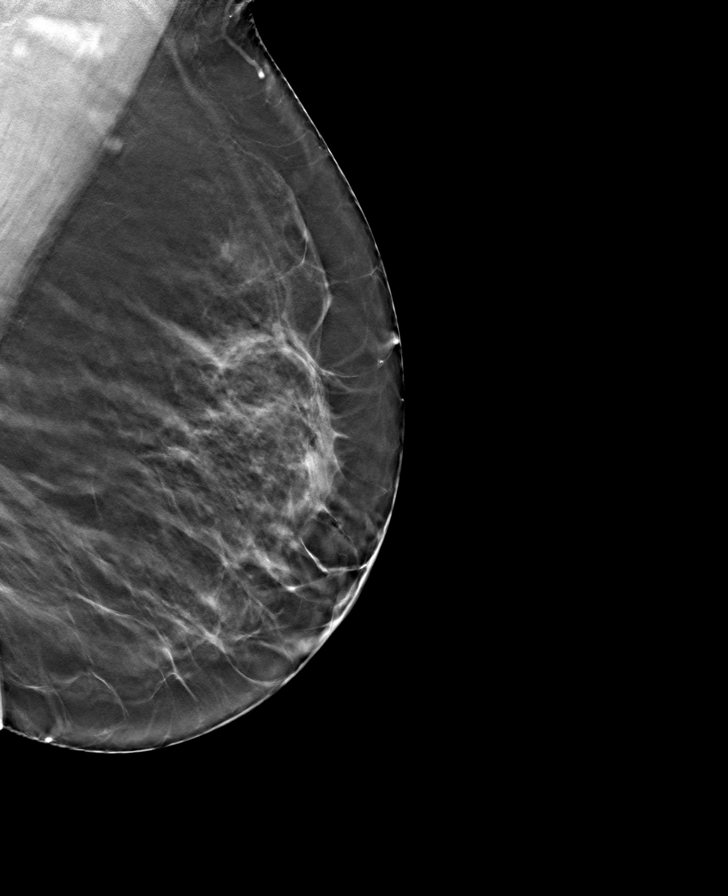

[8 of 24 positions shown; findings below may reference images not displayed]

ACR Breast Density Category b: There are scattered areas of
fibroglandular density.
FINDINGS: There are no findings suspicious for malignancy. Images were
processed with CAD.
IMPRESSION: No mammographic evidence of malignancy. A result letter of this
screening mammogram will be mailed directly to the patient.

RECOMMENDATION:
Screening mammogram in one year. (Code:[TQ])

BI-RADS CATEGORY  1: Negative.

## 2019-10-17 ENCOUNTER — Emergency Department (HOSPITAL_COMMUNITY)
Admission: EM | Admit: 2019-10-17 | Discharge: 2019-10-17 | Disposition: A | Discharge status: Home / Self Care | Payer: PRIVATE HEALTH INSURANCE | Attending: Emergency Medicine | Admitting: Emergency Medicine

## 2019-10-17 ENCOUNTER — Encounter (HOSPITAL_COMMUNITY): Payer: Self-pay | Admitting: Emergency Medicine

## 2019-10-17 ENCOUNTER — Other Ambulatory Visit: Payer: Self-pay

## 2019-10-17 DIAGNOSIS — Z7982 Long term (current) use of aspirin: Secondary | ICD-10-CM | POA: Insufficient documentation

## 2019-10-17 DIAGNOSIS — F17200 Nicotine dependence, unspecified, uncomplicated: Secondary | ICD-10-CM | POA: Insufficient documentation

## 2019-10-17 DIAGNOSIS — J209 Acute bronchitis, unspecified: Secondary | ICD-10-CM | POA: Insufficient documentation

## 2019-10-17 DIAGNOSIS — I10 Essential (primary) hypertension: Secondary | ICD-10-CM | POA: Insufficient documentation

## 2019-10-17 DIAGNOSIS — Z79899 Other long term (current) drug therapy: Secondary | ICD-10-CM | POA: Insufficient documentation

## 2019-10-17 DIAGNOSIS — Z20828 Contact with and (suspected) exposure to other viral communicable diseases: Secondary | ICD-10-CM | POA: Insufficient documentation

## 2019-10-17 LAB — SARS CORONAVIRUS 2 (TAT 6-24 HRS): SARS Coronavirus 2: NEGATIVE

## 2019-10-17 MED ORDER — DOXYCYCLINE HYCLATE 100 MG PO CAPS
100.0000 mg | ORAL_CAPSULE | Freq: Two times a day (BID) | ORAL | 0 refills | Status: DC
Start: 2019-10-17 — End: 2020-08-26

## 2019-10-17 MED ORDER — PREDNISONE 10 MG PO TABS: 20.0000 mg | ORAL_TABLET | Freq: Two times a day (BID) | ORAL | 0 refills | Status: DC

## 2019-10-17 NOTE — ED Provider Notes (Signed)
[facility] EMERGENCY DEPARTMENT Provider Note   CSN: [id number] Arrival date & time: 10/17/19  [id number]     History   Chief Complaint Chief Complaint  Patient presents with  . Wheezing    HPI Monica [name] is a 48 y.o. female.     Patient is a 48 year old female with with history of bronchitis.  She presents today with complaints of wheezing.  This started several days ago after being exposed to fumes at work.  She has been using her inhaler with little relief.  She does report chest congestion, but cough has been nonproductive.  She denies any chest pain, high fever.  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  The history is provided by the patient.  Wheezing Severity:  Moderate Severity compared to prior episodes:  Similar Onset quality:  Sudden Duration:  3 days Timing:  Constant Progression:  Worsening Chronicity:  Recurrent Context: fumes   Relieved by:  Nothing Worsened by:  Nothing Ineffective treatments:  Beta-agonist inhaler   Past Medical History:  Diagnosis Date  . Hypertension     There are no active problems to display for this patient.   Past Surgical History:  Procedure Laterality Date  . ABDOMINAL HYSTERECTOMY       OB History   No obstetric history on file.      Home Medications    Prior to Admission medications   Medication Sig Start Date End Date Taking? Authorizing Provider  acetaminophen-codeine (TYLENOL #3) 300-30 MG per tablet Take 1-2 tablets by mouth every 6 (six) hours as needed for moderate pain. Patient not taking: Reported on 08/01/2017 08/14/15   Lily Kocher, PA-C  aspirin EC 81 MG tablet Take 81 mg by mouth daily.    [provider]  azithromycin (ZITHROMAX) 250 MG tablet 1 po daily 09/08/18   Lily Kocher, PA-C  ciprofloxacin (CIPRO) 500 MG tablet Take 1 tablet (500 mg total) by mouth 2 (two) times daily. Patient not taking: Reported on 08/01/2017  08/14/15   Lily Kocher, PA-C  cyclobenzaprine (FLEXERIL) 10 MG tablet Take 1 tablet (10 mg total) by mouth 2 (two) times daily as needed for muscle spasms. Patient not taking: Reported on 08/14/2015 10/20/13   Ashley Murrain, NP  dexamethasone (DECADRON) 4 MG tablet Take 1 tablet (4 mg total) by mouth 2 (two) times daily with a meal. 09/08/18   Lily Kocher, PA-C  lisinopril (PRINIVIL,ZESTRIL) 20 MG tablet Take 20 mg by mouth daily.    [provider]  predniSONE (DELTASONE) 20 MG tablet Take 2 tablets (40 mg total) by mouth daily with breakfast. 08/01/17   Triplett, Tammy, PA-C  promethazine-dextromethorphan (PROMETHAZINE-DM) 6.25-15 MG/5ML syrup Take 10 mLs by mouth 4 (four) times daily as needed. 09/08/18   Lily Kocher, PA-C    Family History No family history on file.  Social History Social History   Tobacco Use  . Smoking status: Current Every Day Smoker    Packs/day: 0.50  . Smokeless tobacco: Never Used  Substance Use Topics  . Alcohol use: No  . Drug use: Never     Allergies   Patient has no known allergies.   Review of Systems Review of Systems  Respiratory: Positive for wheezing.   All other systems reviewed and are negative.    Physical Exam Updated Vital Signs BP (!) 147/104 (BP Location: Right Arm)   Pulse 91   Temp 98.9 F (37.2 C) (Oral)  Resp 16   SpO2 97%   Physical Exam Vitals signs and nursing note reviewed.  Constitutional:      General: She is not in acute distress.    Appearance: She is well-developed. She is not diaphoretic.  HENT:     Head: Normocephalic and atraumatic.  Neck:     Musculoskeletal: Normal range of motion and neck supple.  Cardiovascular:     Rate and Rhythm: Normal rate and regular rhythm.     Heart sounds: No murmur. No friction rub. No gallop.   Pulmonary:     Effort: Pulmonary effort is normal. No respiratory distress.     Breath sounds: Wheezing present.     Comments: There are slight expiratory rhonchi  bilaterally.  Patient is in no respiratory distress. Abdominal:     General: Bowel sounds are normal. There is no distension.     Palpations: Abdomen is soft.     Tenderness: There is no abdominal tenderness.  Musculoskeletal: Normal range of motion.  Skin:    General: Skin is warm and dry.  Neurological:     Mental Status: She is alert and oriented to person, place, and time.      ED Treatments / Results  Labs (all labs ordered are listed, but only abnormal results are displayed) Labs Reviewed  SARS CORONAVIRUS 2 (TAT 6-24 HRS)    EKG None  Radiology No results found.  Procedures Procedures (including critical care time)  Medications Ordered in ED Medications - No data to display   Initial Impression / Assessment and Plan / ED Course  I have reviewed the triage vital signs and the nursing notes.  Pertinent labs & imaging results that were available during my care of the patient were reviewed by me and considered in my medical decision making (see chart for details).  The patient will be prescribed an antibiotic, prednisone, and is to continue use of her inhaler.  A Covid test will be obtained and sent out with results in the next 1 to 2 days.  Patient to return as needed if symptoms worsen or change.  She has in no respiratory distress with stable vital signs and no hypoxia.  Final Clinical Impressions(s) / ED Diagnoses   Final diagnoses:  None    ED Discharge Orders    None       Veryl Speak, MD 10/17/19 [phone]

## 2019-10-17 NOTE — ED Triage Notes (Signed)
Pt with hx of asthma and chronic bronchitis c/o wheezing since Sunday. Pt stated it began after she used cleaning products. Pt used albuterol inhaler without relief. NAD noted at this time.

## 2019-10-17 NOTE — Discharge Instructions (Addendum)
Begin taking doxycycline and prednisone as prescribed.  Continue use of your inhaler, 2 puffs every 4 hours as needed.  Quarantine at home until the results of your Covid test are known.  Return to the emergency department if you develop severe chest pain, worsening breathing, or other new and concerning symptoms.         Infection Prevention Recommendations for Individuals Confirmed to have, or Being Evaluated for, 2019 Novel Coronavirus (COVID-19) Infection Who Receive Care at Home  Individuals who are confirmed to have, or are being evaluated for, COVID-19 should follow the prevention steps below until a healthcare provider or local or state health department says they can return to normal activities.  Stay home except to get medical care You should restrict activities outside your home, except for getting medical care. Do not go to work, school, or public areas, and do not use public transportation or taxis.  Call ahead before visiting your doctor Before your medical appointment, call the healthcare provider and tell them that you have, or are being evaluated for, COVID-19 infection. This will help the healthcare providers office take steps to keep other people from getting infected. Ask your healthcare provider to call the local or state health department.  Monitor your symptoms Seek prompt medical attention if your illness is worsening (e.g., difficulty breathing). Before going to your medical appointment, call the healthcare provider and tell them that you have, or are being evaluated for, COVID-19 infection. Ask your healthcare provider to call the local or state health department.  Wear a facemask You should wear a facemask that covers your nose and mouth when you are in the same room with other people and when you visit a healthcare provider. People who live with or visit you should also wear a facemask while they are in the same room with you.  Separate yourself  from other people in your home As much as possible, you should stay in a different room from other people in your home. Also, you should use a separate bathroom, if available.  Avoid sharing household items You should not share dishes, drinking glasses, cups, eating utensils, towels, bedding, or other items with other people in your home. After using these items, you should wash them thoroughly with soap and water.  Cover your coughs and sneezes Cover your mouth and nose with a tissue when you cough or sneeze, or you can cough or sneeze into your sleeve. Throw used tissues in a lined trash can, and immediately wash your hands with soap and water for at least 20 seconds or use an alcohol-based hand rub.  Wash your [facility] your hands often and thoroughly with soap and water for at least 20 seconds. You can use an alcohol-based hand sanitizer if soap and water are not available and if your hands are not visibly dirty. Avoid touching your eyes, nose, and mouth with unwashed hands.   Prevention Steps for Caregivers and Household Members of Individuals Confirmed to have, or Being Evaluated for, COVID-19 Infection Being Cared for in the Home  If you live with, or provide care at home for, a person confirmed to have, or being evaluated for, COVID-19 infection please follow these guidelines to prevent infection:  Follow healthcare providers instructions Make sure that you understand and can help the patient follow any healthcare provider instructions for all care.  Provide for the patients basic needs You should help the patient with basic needs in the home and provide support for getting groceries,  prescriptions, and other personal needs.  Monitor the patients symptoms If they are getting sicker, call his or her medical provider and tell them that the patient has, or is being evaluated for, COVID-19 infection. This will help the healthcare providers office take steps to keep other  people from getting infected. Ask the healthcare provider to call the local or state health department.  Limit the number of people who have contact with the patient If possible, have only one caregiver for the patient. Other household members should stay in another home or place of residence. If this is not possible, they should stay in another room, or be separated from the patient as much as possible. Use a separate bathroom, if available. Restrict visitors who do not have an essential need to be in the home.  Keep older adults, very young children, and other sick people away from the patient Keep older adults, very young children, and those who have compromised immune systems or chronic health conditions away from the patient. This includes people with chronic heart, lung, or kidney conditions, diabetes, and cancer.  Ensure good ventilation Make sure that shared spaces in the home have good air flow, such as from an air conditioner or an opened window, weather permitting.  Wash your hands often Wash your hands often and thoroughly with soap and water for at least 20 seconds. You can use an alcohol based hand sanitizer if soap and water are not available and if your hands are not visibly dirty. Avoid touching your eyes, nose, and mouth with unwashed hands. Use disposable paper towels to dry your hands. If not available, use dedicated cloth towels and replace them when they become wet.  Wear a facemask and gloves Wear a disposable facemask at all times in the room and gloves when you touch or have contact with the patients blood, body fluids, and/or secretions or excretions, such as sweat, saliva, sputum, nasal mucus, vomit, urine, or feces.  Ensure the mask fits over your nose and mouth tightly, and do not touch it during use. Throw out disposable facemasks and gloves after using them. Do not reuse. Wash your hands immediately after removing your facemask and gloves. If your personal  clothing becomes contaminated, carefully remove clothing and launder. Wash your hands after handling contaminated clothing. Place all used disposable facemasks, gloves, and other waste in a lined container before disposing them with other household waste. Remove gloves and wash your hands immediately after handling these items.  Do not share dishes, glasses, or other household items with the patient Avoid sharing household items. You should not share dishes, drinking glasses, cups, eating utensils, towels, bedding, or other items with a patient who is confirmed to have, or being evaluated for, COVID-19 infection. After the person uses these items, you should wash them thoroughly with soap and water.  Wash laundry thoroughly Immediately remove and wash clothes or bedding that have blood, body fluids, and/or secretions or excretions, such as sweat, saliva, sputum, nasal mucus, vomit, urine, or feces, on them. Wear gloves when handling laundry from the patient. Read and follow directions on labels of laundry or clothing items and detergent. In general, wash and dry with the warmest temperatures recommended on the label.  Clean all areas the individual has used often Clean all touchable surfaces, such as counters, tabletops, doorknobs, bathroom fixtures, toilets, phones, keyboards, tablets, and bedside tables, every day. Also, clean any surfaces that may have blood, body fluids, and/or secretions or excretions on them. Wear gloves  when cleaning surfaces the patient has come in contact with. Use a diluted bleach solution (e.g., dilute bleach with 1 part bleach and 10 parts water) or a household disinfectant with a label that says EPA-registered for coronaviruses. To make a bleach solution at home, add 1 tablespoon of bleach to 1 quart (4 cups) of water. For a larger supply, add  cup of bleach to 1 gallon (16 cups) of water. Read labels of cleaning products and follow recommendations provided on product  labels. Labels contain instructions for safe and effective use of the cleaning product including precautions you should take when applying the product, such as wearing gloves or eye protection and making sure you have good ventilation during use of the product. Remove gloves and wash hands immediately after cleaning.  Monitor yourself for signs and symptoms of illness Caregivers and household members are considered close contacts, should monitor their health, and will be asked to limit movement outside of the home to the extent possible. Follow the monitoring steps for close contacts listed on the symptom monitoring form.   ? If you have additional questions, contact your local health department or call the epidemiologist on call at [phone] (available 24/7). ? This guidance is subject to change. For the most up-to-date guidance from [facility], please refer to their website: [email or web address]

## 2020-06-24 ENCOUNTER — Other Ambulatory Visit (HOSPITAL_COMMUNITY): Payer: Self-pay | Admitting: *Deleted

## 2020-06-24 DIAGNOSIS — Z1231 Encounter for screening mammogram for malignant neoplasm of breast: Secondary | ICD-10-CM

## 2020-07-07 ENCOUNTER — Ambulatory Visit (HOSPITAL_COMMUNITY)
Admission: RE | Admit: 2020-07-07 | Discharge: 2020-07-07 | Disposition: A | Discharge status: Home / Self Care | Payer: PRIVATE HEALTH INSURANCE | Source: Ambulatory Visit | Attending: *Deleted | Admitting: *Deleted

## 2020-07-07 ENCOUNTER — Other Ambulatory Visit: Payer: Self-pay

## 2020-07-07 DIAGNOSIS — Z1231 Encounter for screening mammogram for malignant neoplasm of breast: Secondary | ICD-10-CM | POA: Insufficient documentation

## 2020-07-07 IMAGING — MG DIGITAL SCREENING BILAT W/ TOMO W/ CAD
6 of 12 series · 6 of 36 positions shown · non-contrast
Comparison: Previous exam(s).

CLINICAL DATA: Screening.

EXAM:
DIGITAL SCREENING BILATERAL MAMMOGRAM WITH TOMO AND CAD

[R MLO synth-2D (1 of 2)]
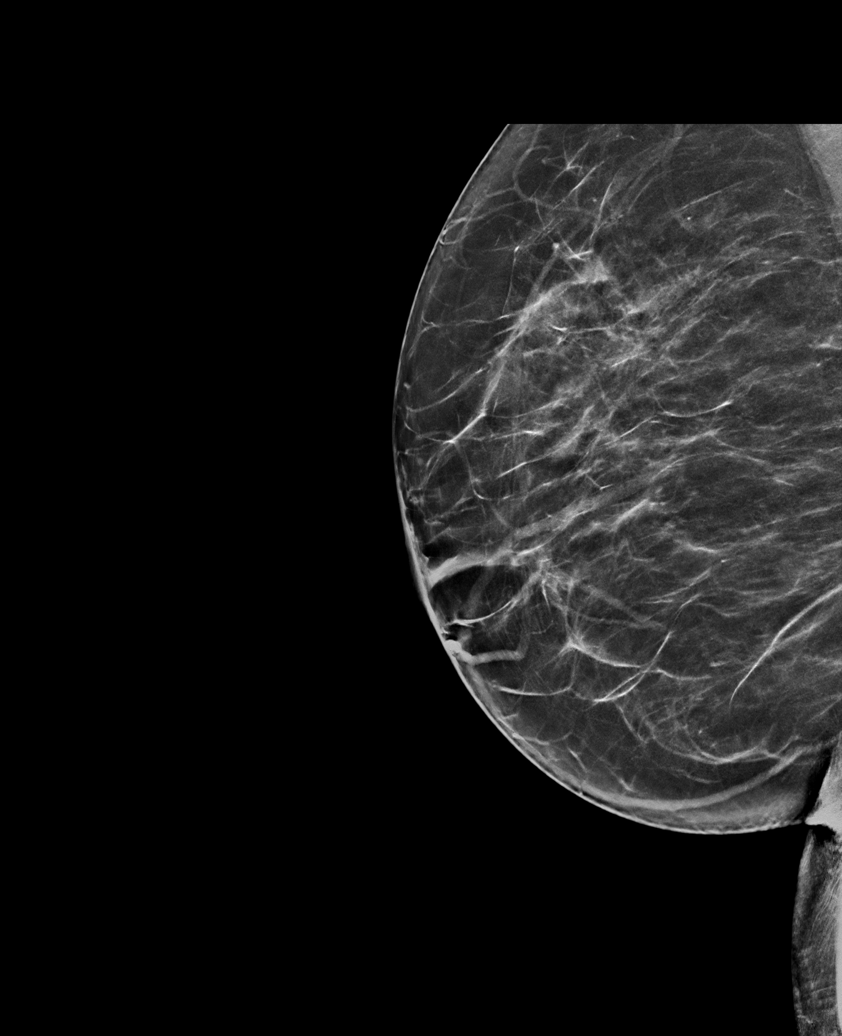

[R CC synth-2D (1 of 2)]
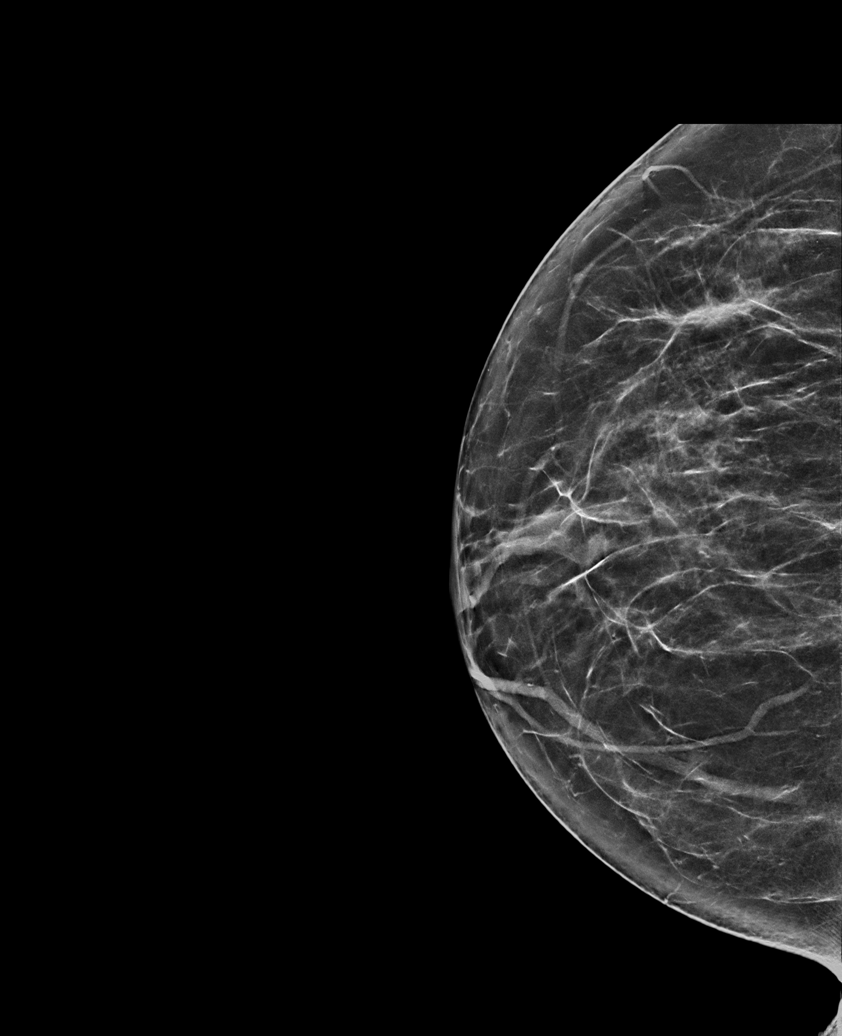

[R CC synth-2D (2 of 2)]
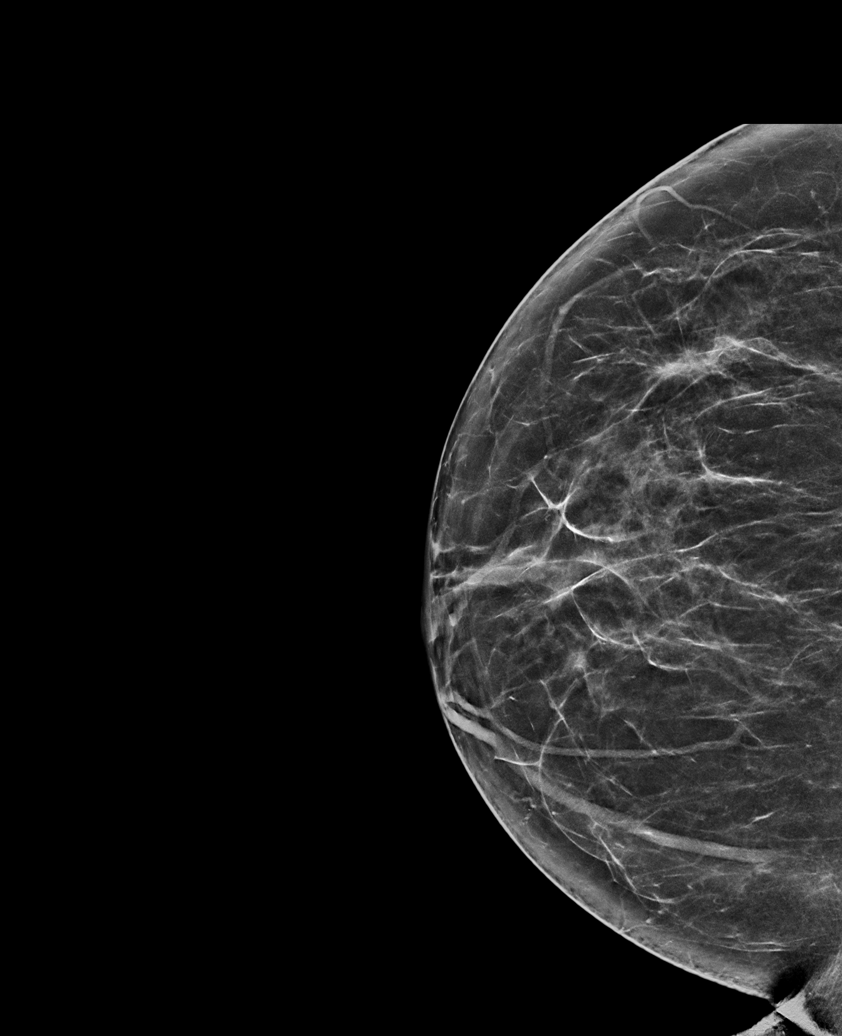

[L MLO synth-2D]
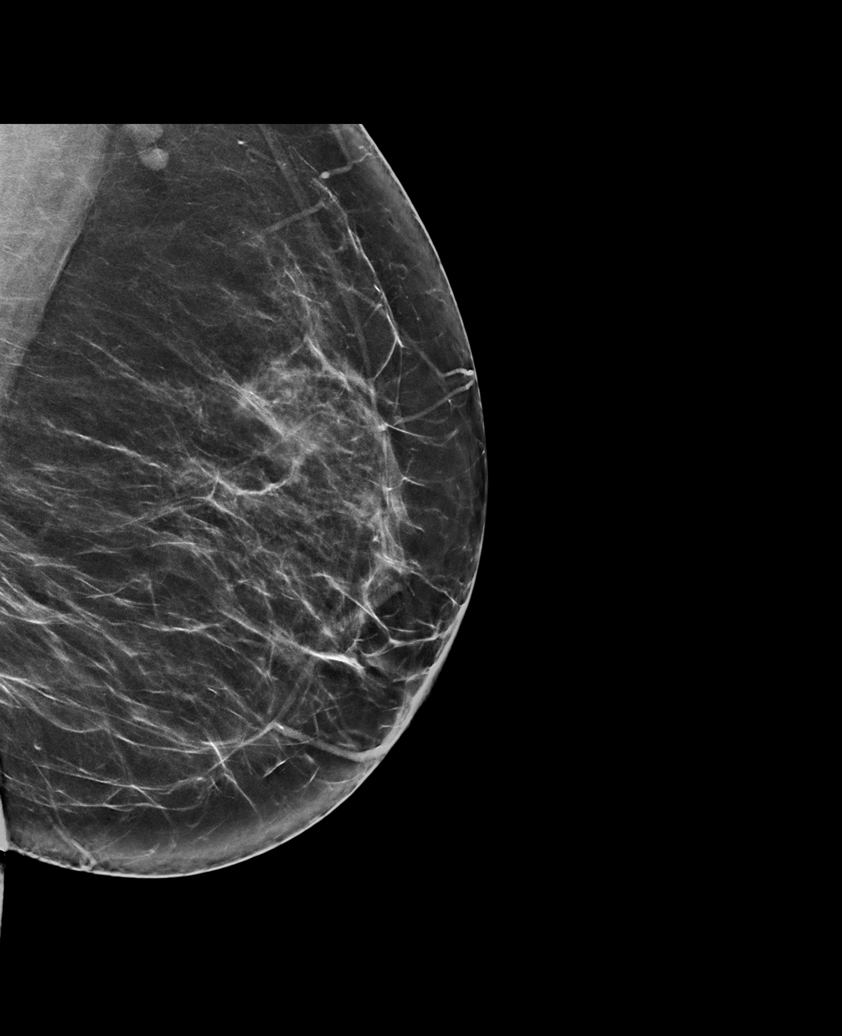

[R MLO synth-2D (2 of 2)]
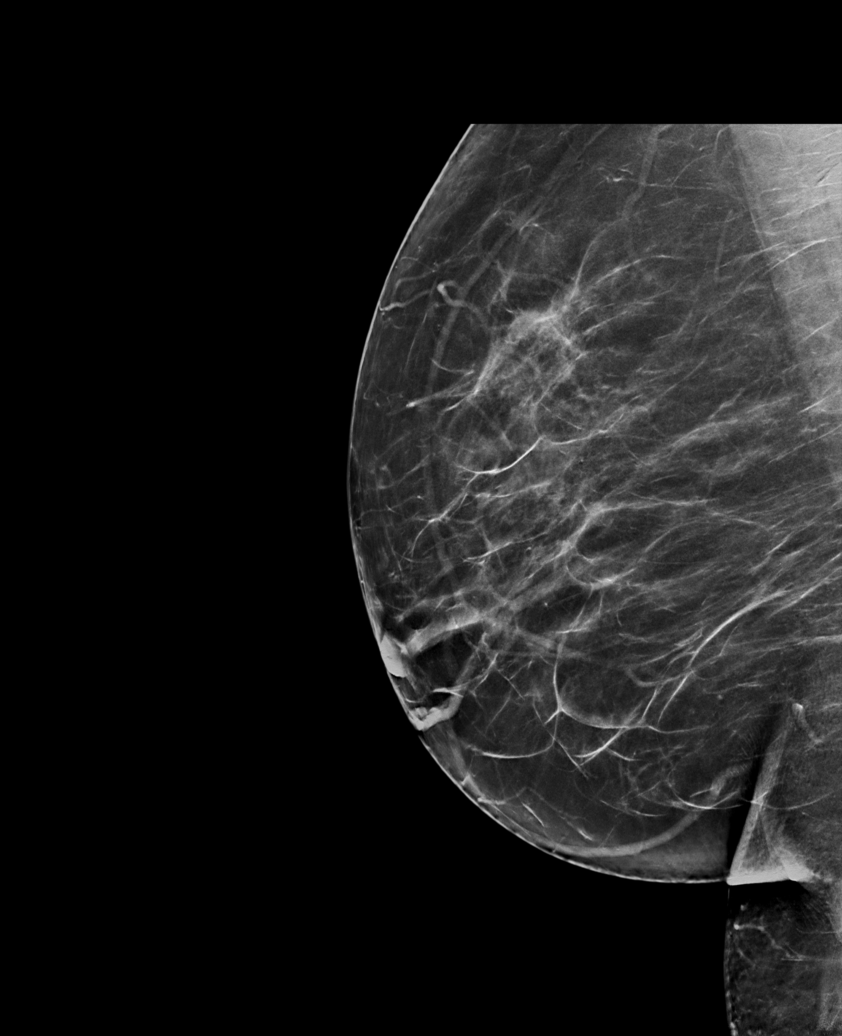

[L CC synth-2D]
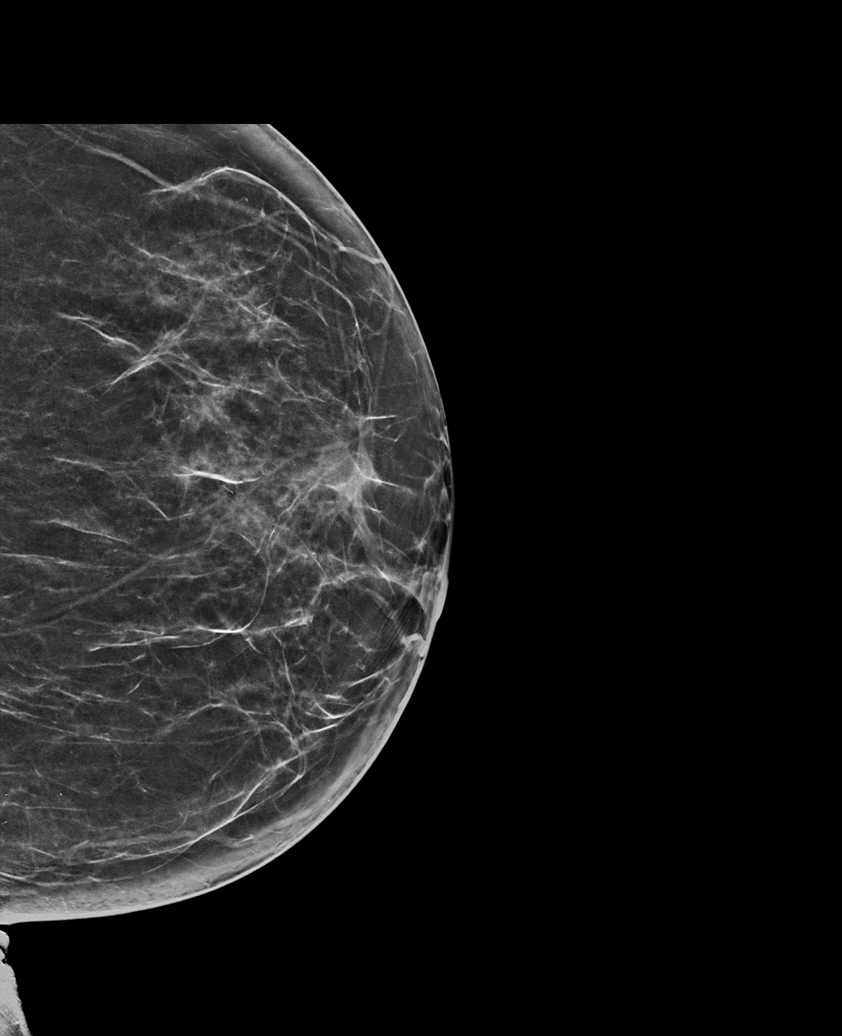

[6 of 36 positions shown; findings below may reference images not displayed]

ACR Breast Density Category b: There are scattered areas of
fibroglandular density.
FINDINGS: In the right breast, a possible asymmetry warrants further
evaluation. In the left breast, no findings suspicious for
malignancy. Images were processed with CAD.
IMPRESSION: Further evaluation is suggested for possible asymmetry in the right
breast.

RECOMMENDATION:
Diagnostic mammogram and possibly ultrasound of the right breast.
(Code:[VC])

The patient will be contacted regarding the findings, and additional
imaging will be scheduled.

BI-RADS CATEGORY  0: Incomplete. Need additional imaging evaluation
and/or prior mammograms for comparison.

## 2020-07-09 ENCOUNTER — Other Ambulatory Visit (HOSPITAL_COMMUNITY): Payer: Self-pay | Admitting: *Deleted

## 2020-07-09 DIAGNOSIS — N6489 Other specified disorders of breast: Secondary | ICD-10-CM

## 2020-07-29 ENCOUNTER — Ambulatory Visit (HOSPITAL_COMMUNITY)
Admission: RE | Admit: 2020-07-29 | Discharge: 2020-07-29 | Disposition: A | Discharge status: Home / Self Care | Payer: PRIVATE HEALTH INSURANCE | Source: Ambulatory Visit | Attending: *Deleted | Admitting: *Deleted

## 2020-07-29 ENCOUNTER — Other Ambulatory Visit (HOSPITAL_COMMUNITY): Payer: Self-pay | Admitting: *Deleted

## 2020-07-29 ENCOUNTER — Other Ambulatory Visit: Payer: Self-pay

## 2020-07-29 DIAGNOSIS — N6489 Other specified disorders of breast: Secondary | ICD-10-CM | POA: Diagnosis present

## 2020-07-29 DIAGNOSIS — R928 Other abnormal and inconclusive findings on diagnostic imaging of breast: Secondary | ICD-10-CM

## 2020-07-29 IMAGING — MG MM DIGITAL DIAGNOSTIC UNILAT*R* W/ TOMO W/ CAD
4 series · 4 of 12 positions shown · non-contrast
Comparison: Previous exams.

CLINICAL DATA: Screening recall for possible right breast
asymmetry.

EXAM:
DIGITAL DIAGNOSTIC UNILATERAL RIGHT MAMMOGRAM WITH TOMO AND CAD;
ULTRASOUND RIGHT BREAST LIMITED

[R MLO synth-2D]
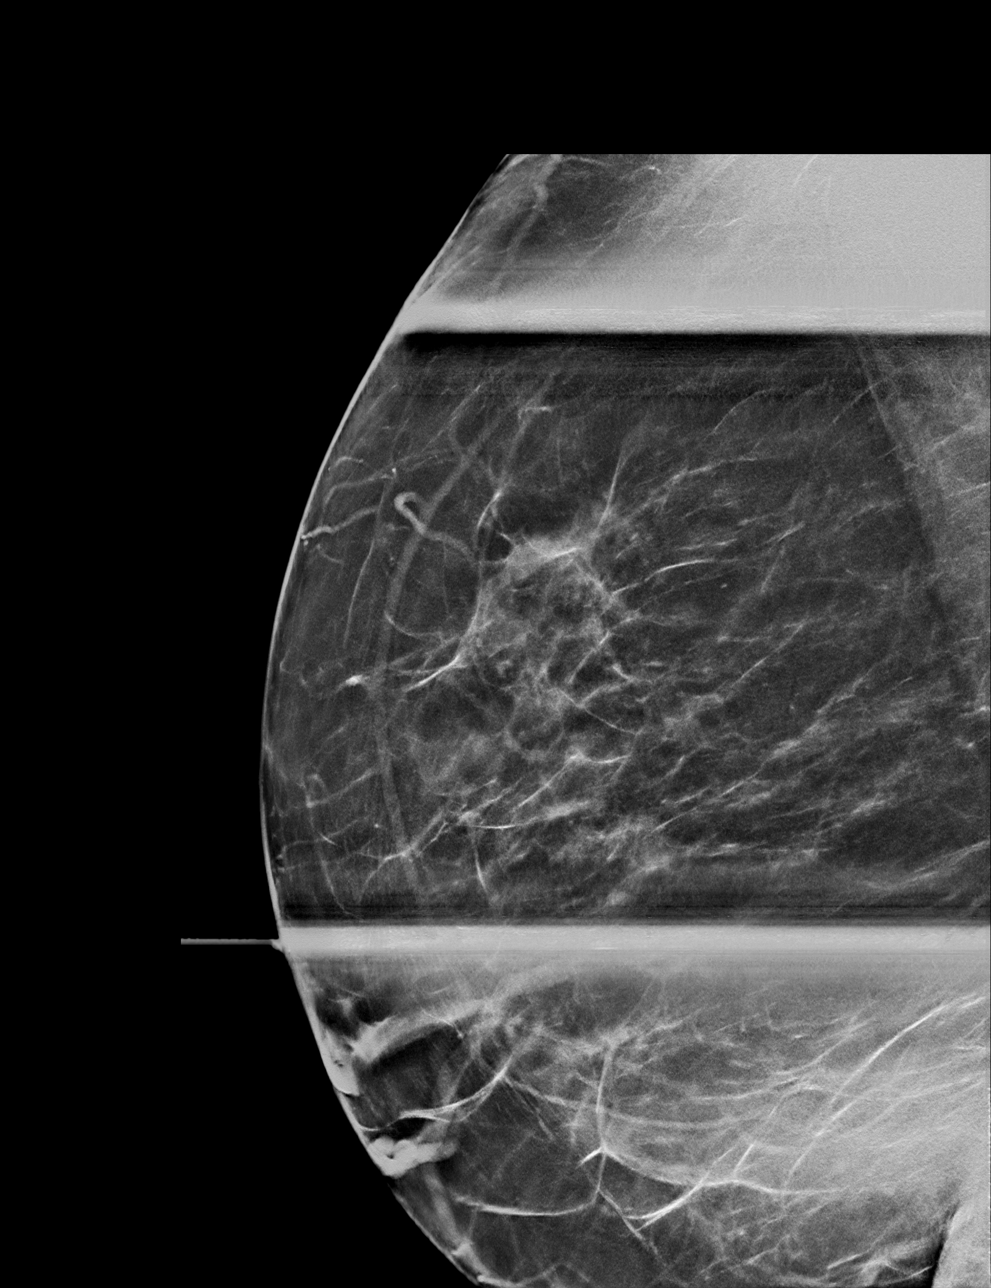

[R CC synth-2D]
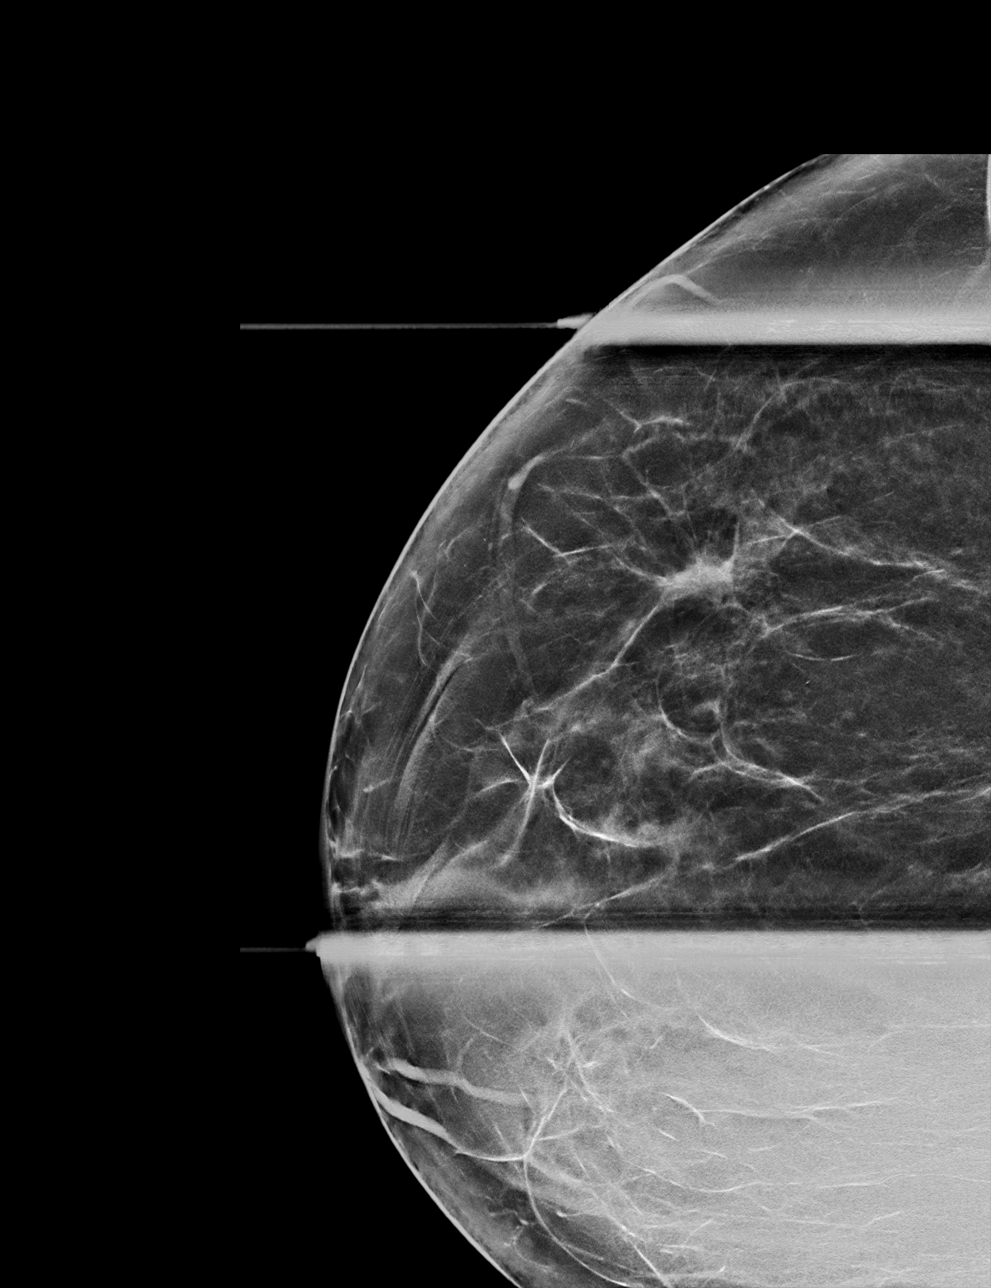

[R CC tomo · tomo slice 33/66.0]
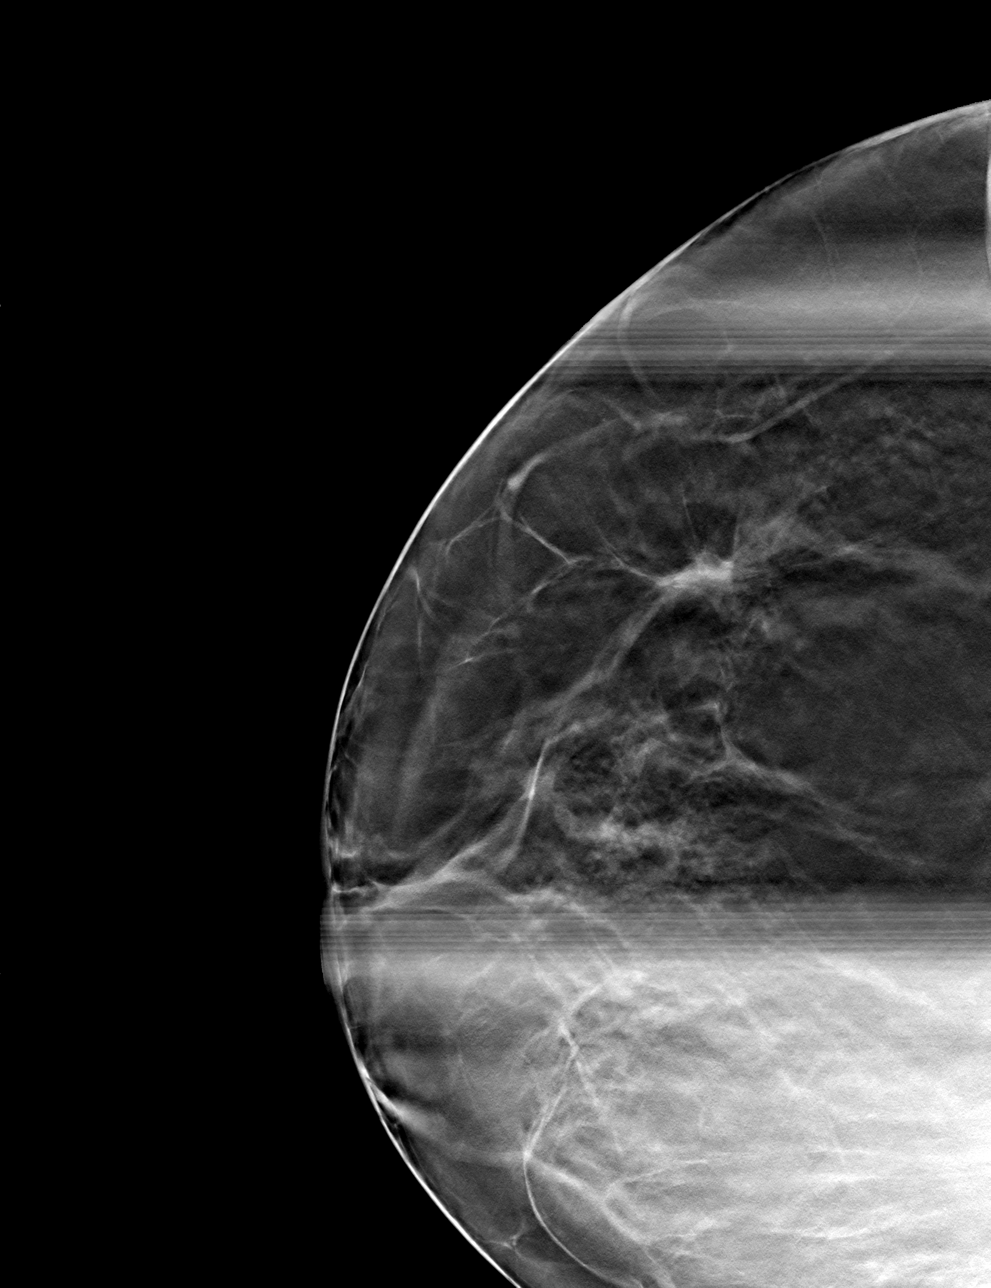

[R MLO tomo · tomo slice 37/72.0]
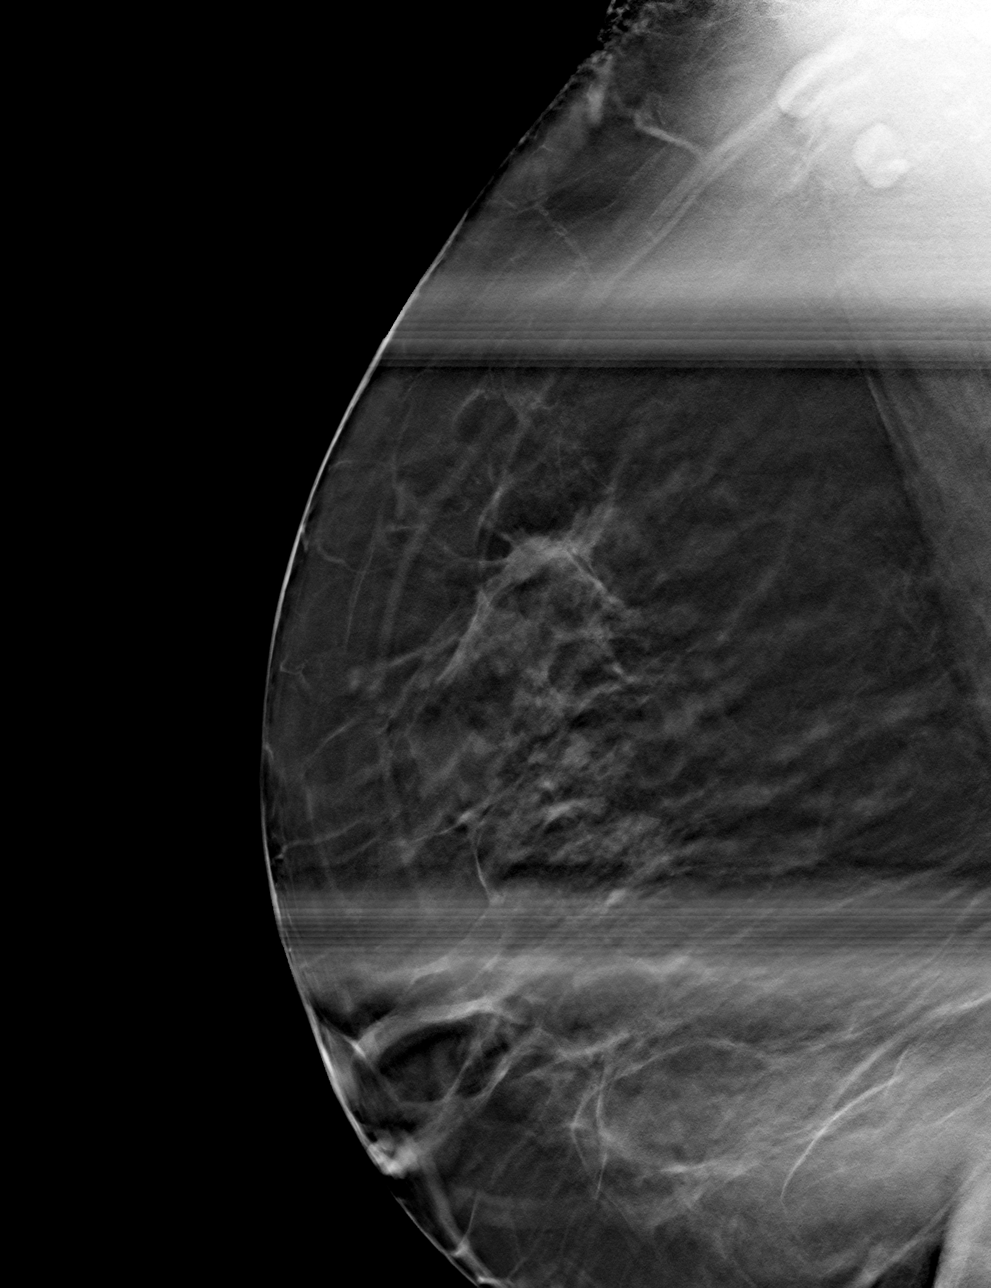

[4 of 12 positions shown; findings below may reference images not displayed]

ACR Breast Density Category b: There are scattered areas of
fibroglandular density.
FINDINGS: Spot compression tomograms were performed over the upper-outer right
breast. There is a persistent mass with associated distortion in the
upper-outer right breast measuring approximately 0.8 cm.

Mammographic images were processed with CAD.

Targeted ultrasound of the upper-outer right breast was performed.
There is an irregular hypoechoic mass at 10 o'clock 8 cm from nipple
measuring 0.8 x 0.5 x 0.6 cm. This corresponds well with the
mass/distortion seen in the upper-outer right breast. No
lymphadenopathy seen in the right axilla.
IMPRESSION: Suspicious 0.8 cm right breast mass.

RECOMMENDATION:
Recommend ultrasound-guided biopsy of the mass in the right breast
the 10 o'clock position.

I have discussed the findings and recommendations with the patient.
If applicable, a reminder letter will be sent to the patient
regarding the next appointment.

BI-RADS CATEGORY  4: Suspicious.

## 2020-07-29 IMAGING — US US BREAST*R* LIMITED INC AXILLA
1 series · 13 of 20 positions shown · non-contrast
Comparison: Previous exams.

CLINICAL DATA: Screening recall for possible right breast
asymmetry.

EXAM:
DIGITAL DIAGNOSTIC UNILATERAL RIGHT MAMMOGRAM WITH TOMO AND CAD;
ULTRASOUND RIGHT BREAST LIMITED

[Series 1: us breast*right* limited inc axilla · 0.07mm/px · 13 of 20 slices shown]
[im 1/20]
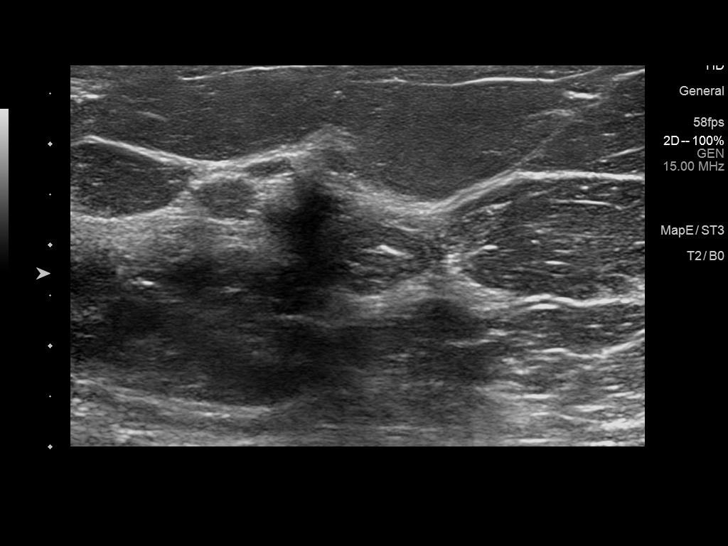
[im 3/20]
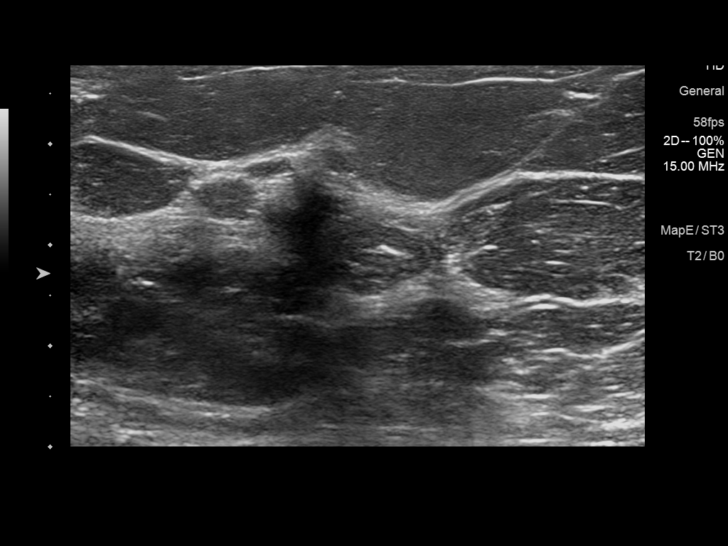
[im 4/20]
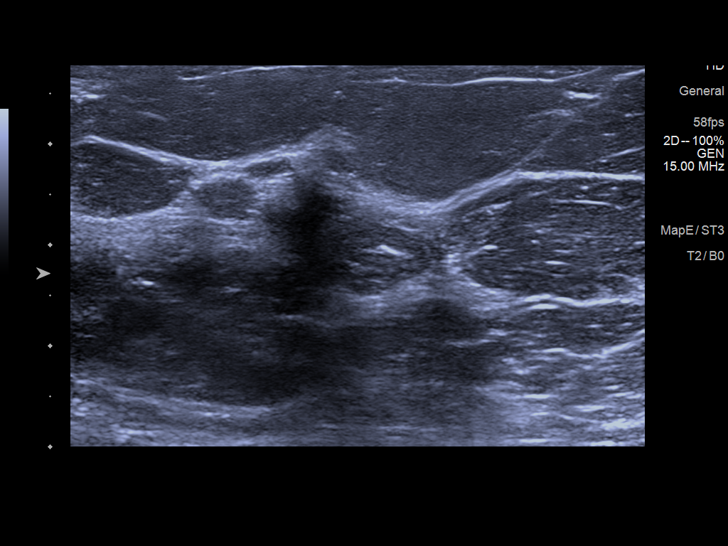
[im 6/20]
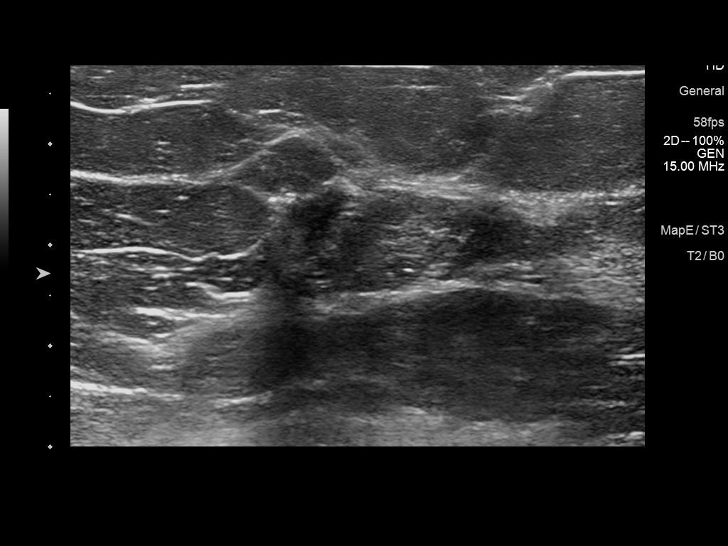
[im 7/20]
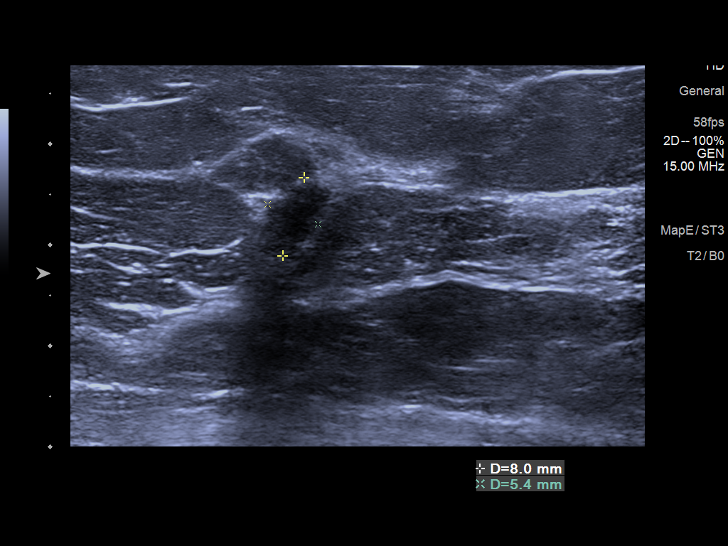
[im 9/20]
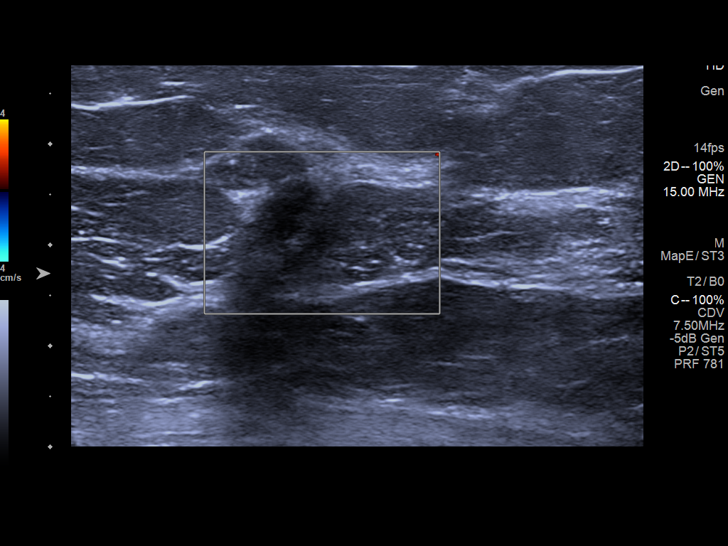
[im 11/20]
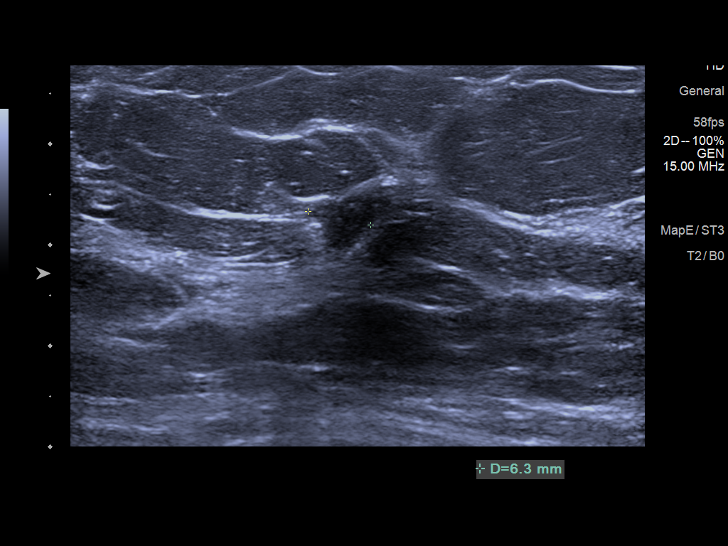
[im 12/20]
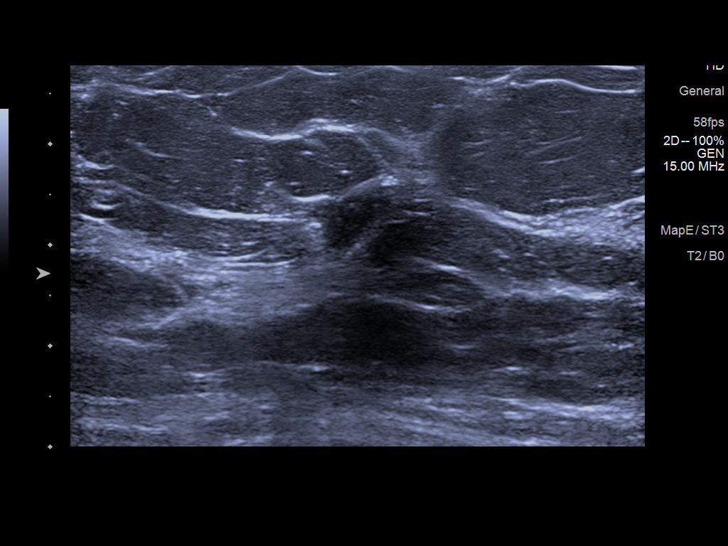
[im 14/20]
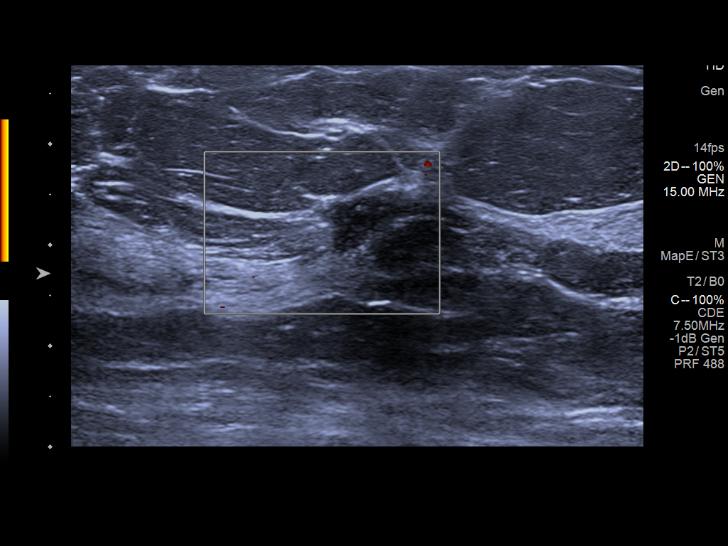
[im 15/20]
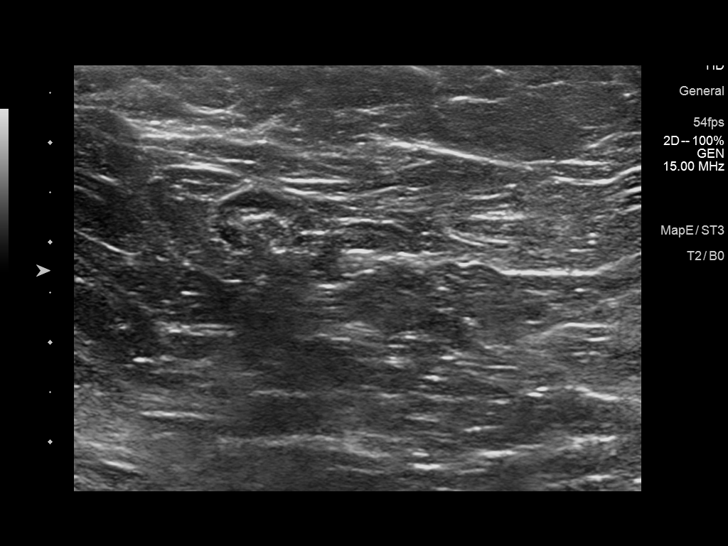
[im 17/20]
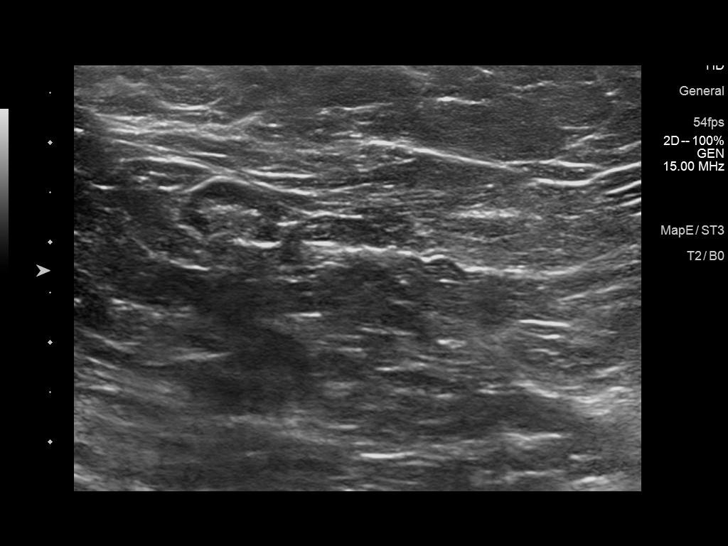
[im 18/20]
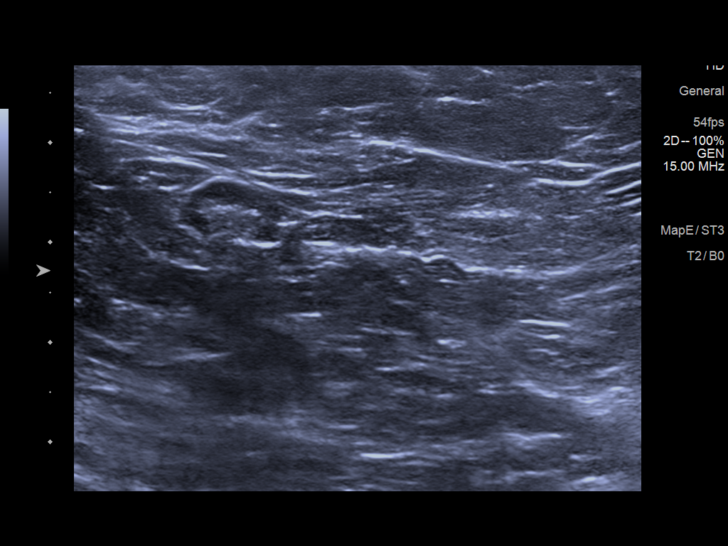
[im 20/20]
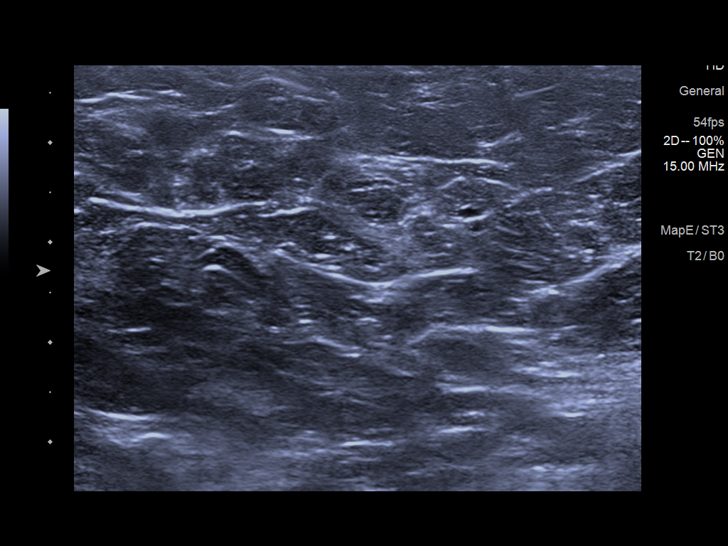

[13 of 20 positions shown; findings below may reference images not displayed]

ACR Breast Density Category b: There are scattered areas of
fibroglandular density.
FINDINGS: Spot compression tomograms were performed over the upper-outer right
breast. There is a persistent mass with associated distortion in the
upper-outer right breast measuring approximately 0.8 cm.

Mammographic images were processed with CAD.

Targeted ultrasound of the upper-outer right breast was performed.
There is an irregular hypoechoic mass at 10 o'clock 8 cm from nipple
measuring 0.8 x 0.5 x 0.6 cm. This corresponds well with the
mass/distortion seen in the upper-outer right breast. No
lymphadenopathy seen in the right axilla.
IMPRESSION: Suspicious 0.8 cm right breast mass.

RECOMMENDATION:
Recommend ultrasound-guided biopsy of the mass in the right breast
the 10 o'clock position.

I have discussed the findings and recommendations with the patient.
If applicable, a reminder letter will be sent to the patient
regarding the next appointment.

BI-RADS CATEGORY  4: Suspicious.

## 2020-08-12 ENCOUNTER — Other Ambulatory Visit: Payer: Self-pay

## 2020-08-12 ENCOUNTER — Ambulatory Visit (HOSPITAL_COMMUNITY)
Admission: RE | Admit: 2020-08-12 | Discharge: 2020-08-12 | Disposition: A | Discharge status: Home / Self Care | Payer: Medicaid Other | Source: Ambulatory Visit | Attending: *Deleted | Admitting: *Deleted

## 2020-08-12 ENCOUNTER — Other Ambulatory Visit (HOSPITAL_COMMUNITY): Payer: Self-pay | Admitting: *Deleted

## 2020-08-12 DIAGNOSIS — R928 Other abnormal and inconclusive findings on diagnostic imaging of breast: Secondary | ICD-10-CM | POA: Insufficient documentation

## 2020-08-12 IMAGING — MG MM BREAST LOCALIZATION CLIP
4 series · 4 of 12 positions shown · non-contrast
Comparison: Previous exams.

CLINICAL DATA: Post ultrasound-guided biopsy of a mass in the right
breast at the 10 o'clock position.

EXAM:
DIAGNOSTIC RIGHT MAMMOGRAM POST ULTRASOUND BIOPSY

[R CC synth-2D]
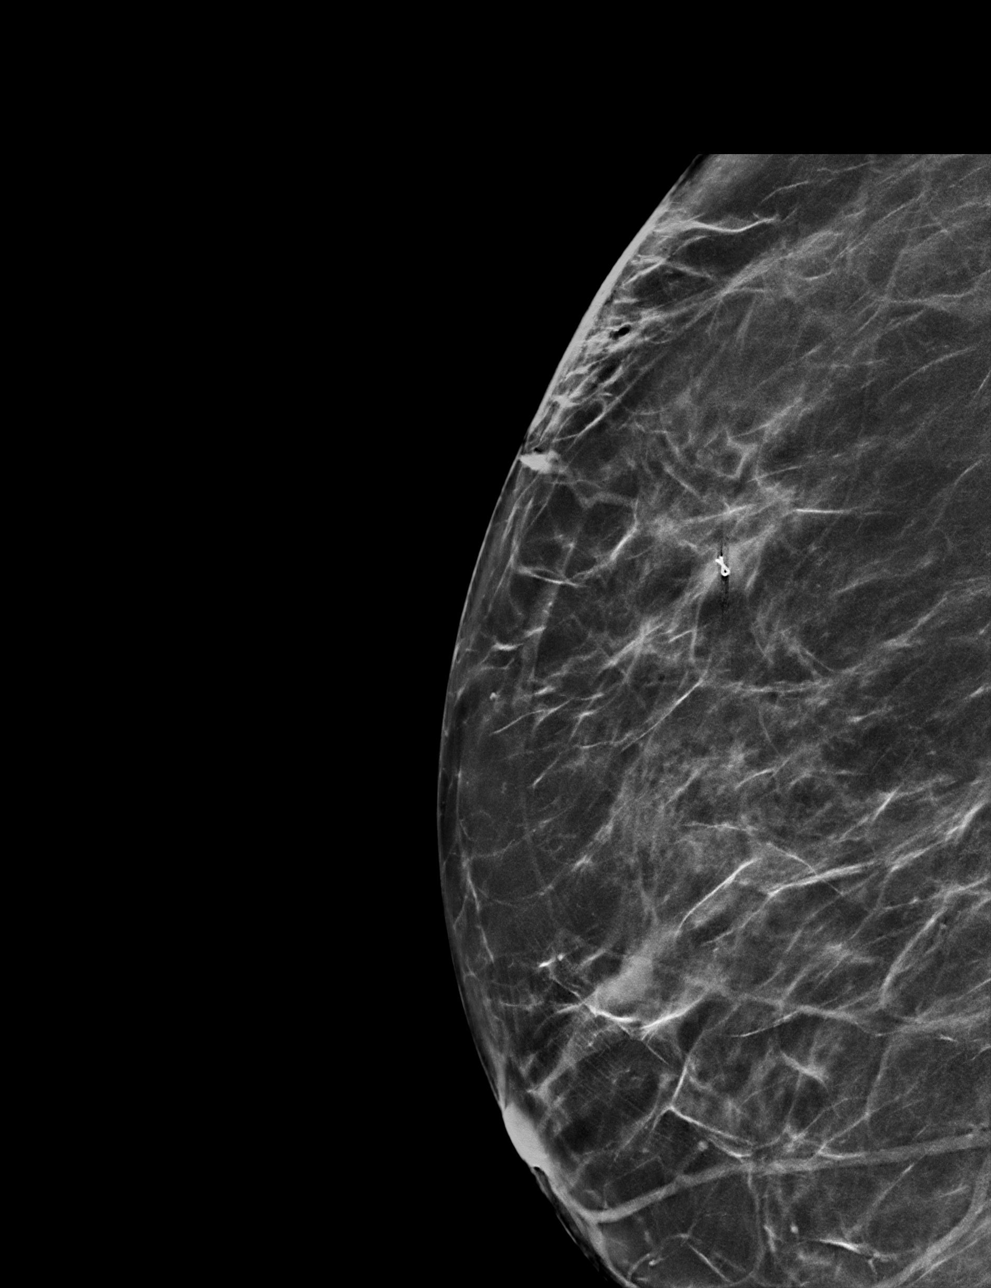

[R ML synth-2D]
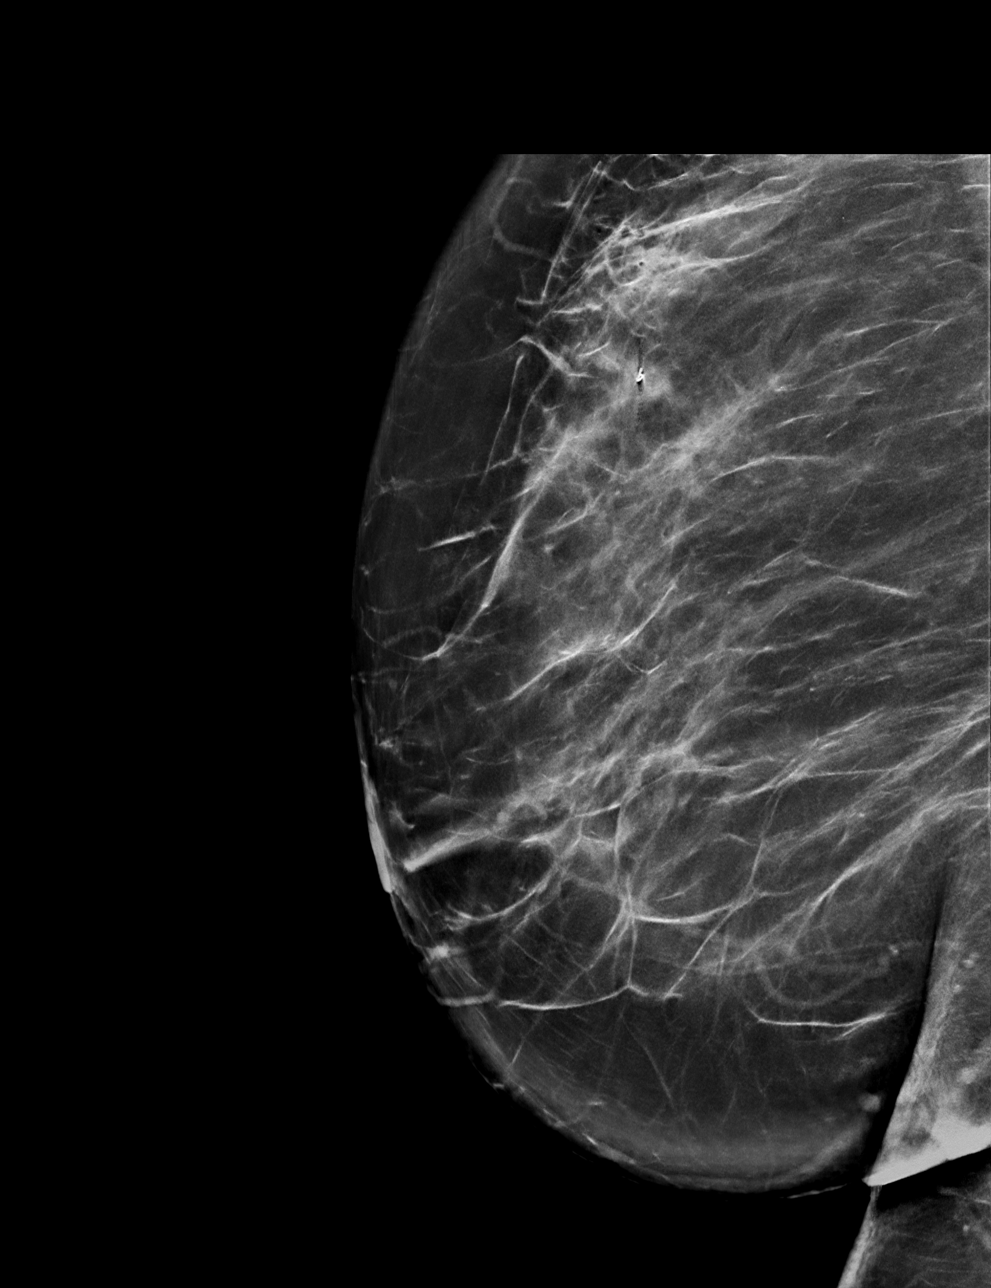

[R ML tomo · tomo slice 45/89.0]
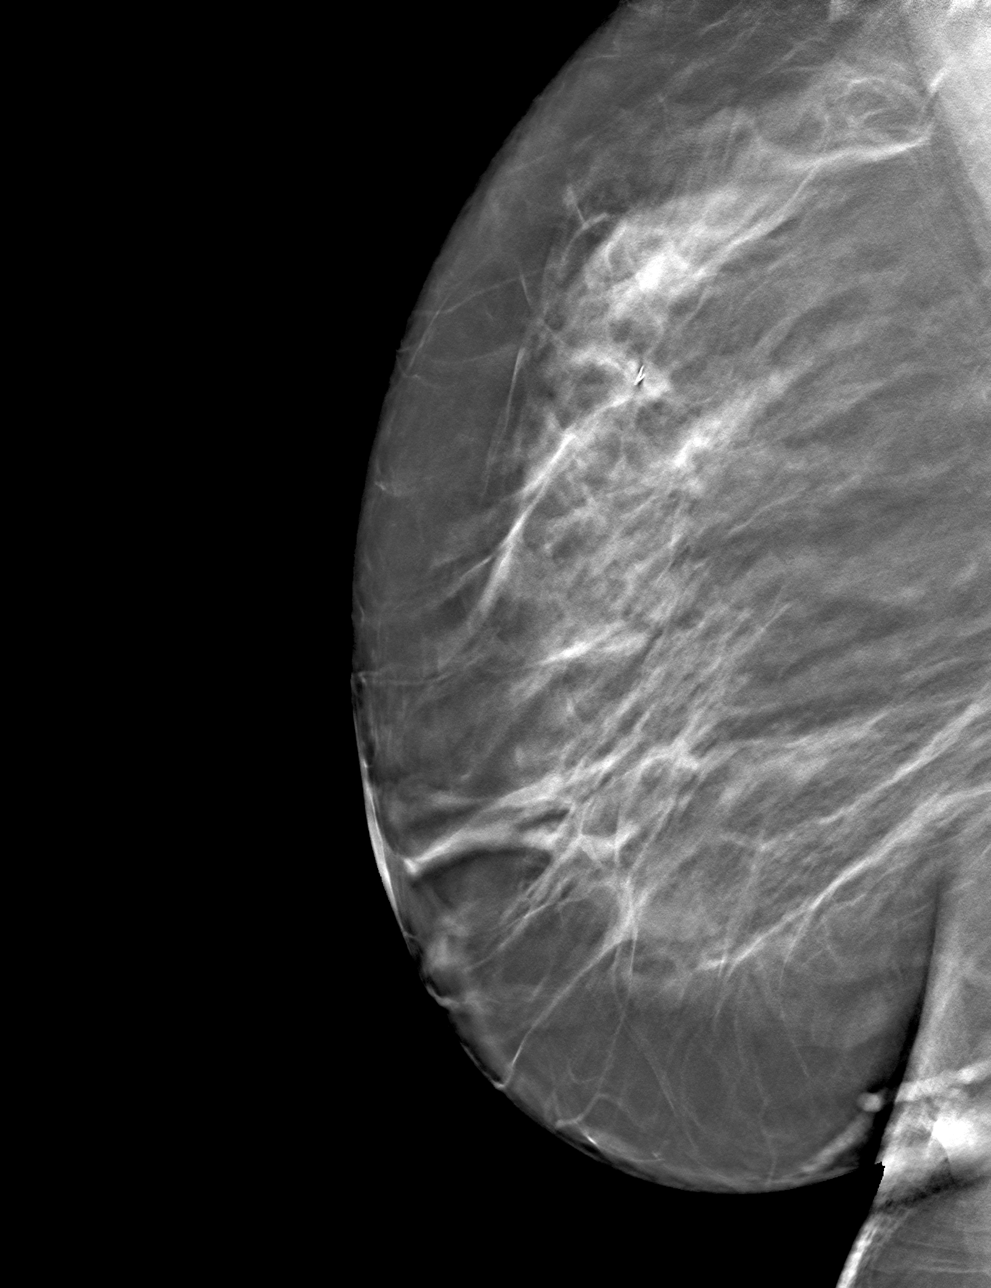

[R CC tomo · tomo slice 41/82.0]
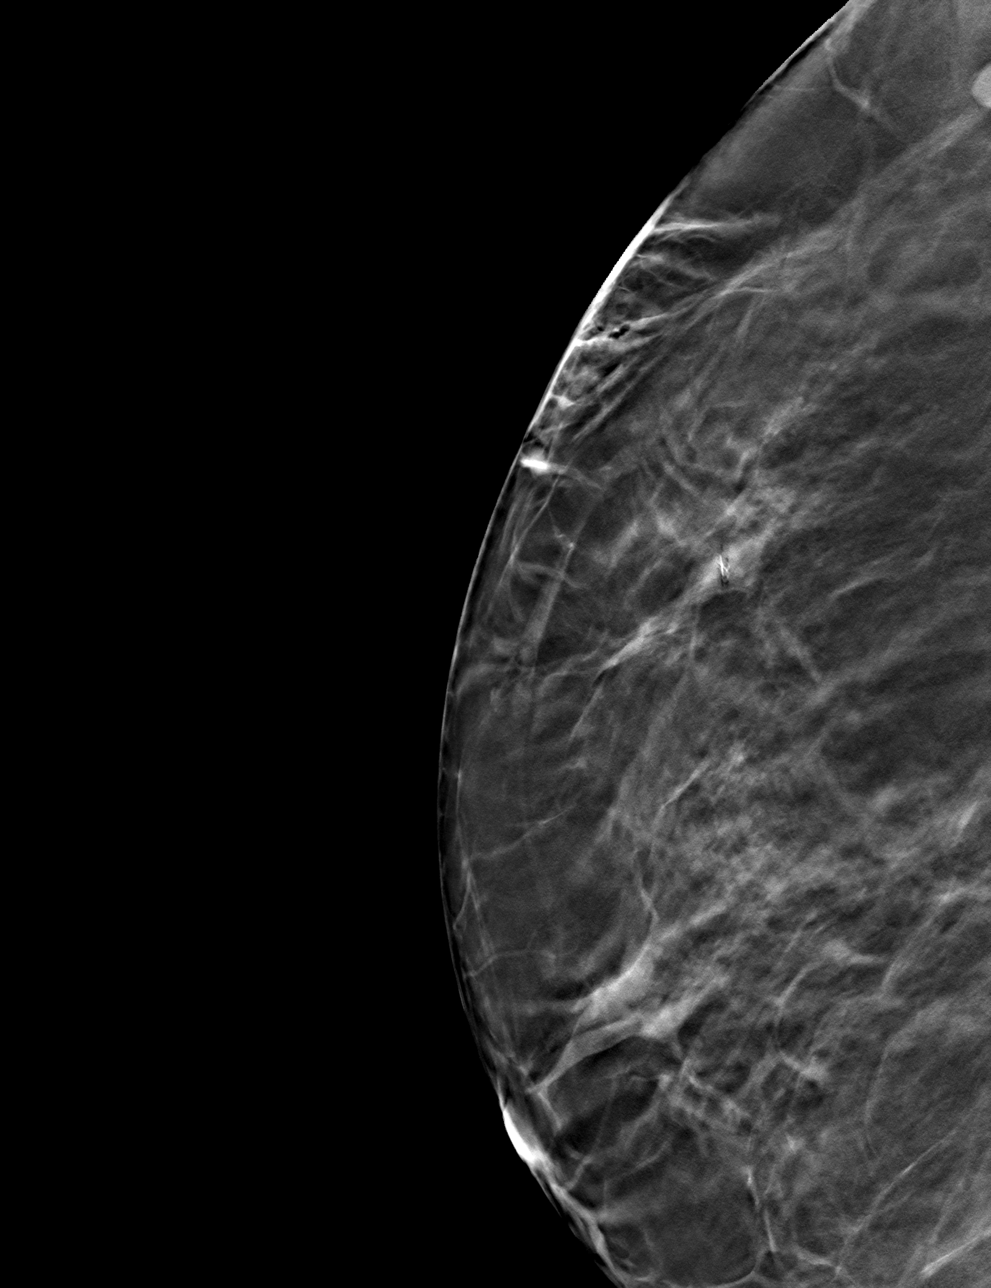

[4 of 12 positions shown; findings below may reference images not displayed]

FINDINGS: Mammographic images were obtained following ultrasound guided biopsy
of a suspicious mass in the right breast 10 position. A ribbon
shaped biopsy marking clip is present and appropriately positioned
the site of the biopsied mass in the right breast at the 10 o'clock
position.
IMPRESSION: Ribbon shaped biopsy marking clip at site of biopsied mass in the
right breast at the 10 o'clock position.

Final Assessment: Post Procedure Mammograms for Marker Placement

## 2020-08-12 IMAGING — US US  BREAST BX W/ LOC DEV 1ST LESION IMG BX SPEC US GUIDE*R*
1 series · 12 of 12 positions shown · non-contrast
Comparison: Previous exam(s).
COMPARISON: Previous exam(s).

Addendum:
CLINICAL DATA: 48-year-old female with a suspicious mass in the
right breast at the 10 o'clock position.

EXAM:
ULTRASOUND GUIDED RIGHT BREAST CORE NEEDLE BIOPSY

[Series 1: us breast bx w/ loc dev 1st lesion img bx spec us  · 0.07mm/px · 12 of 12 slices shown]
[im 1/12]
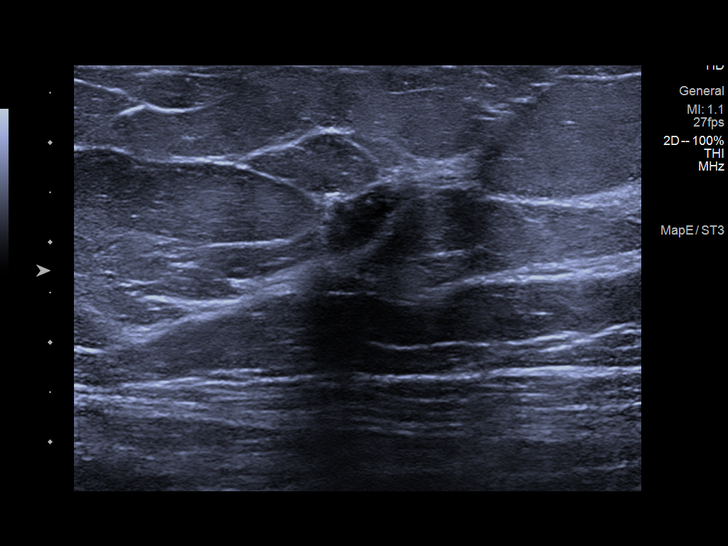
[im 2/12]
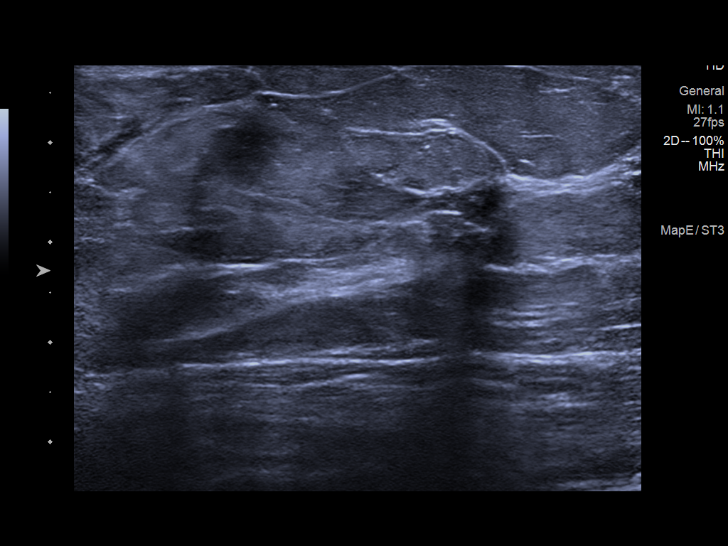
[im 3/12]
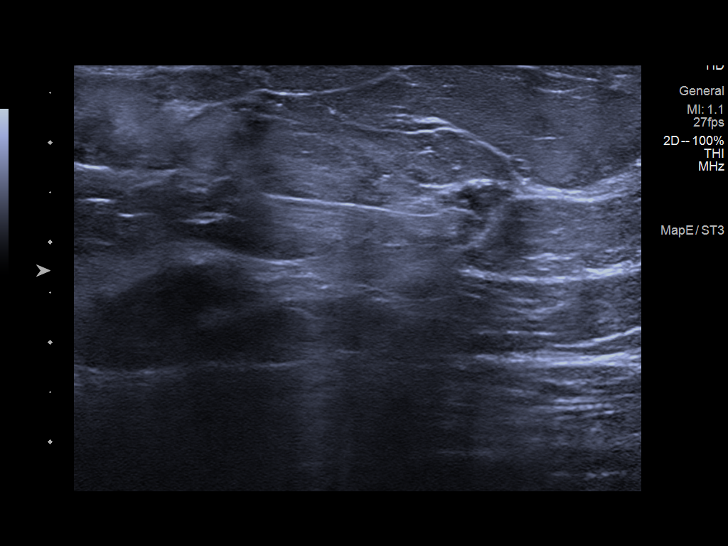
[im 4/12]
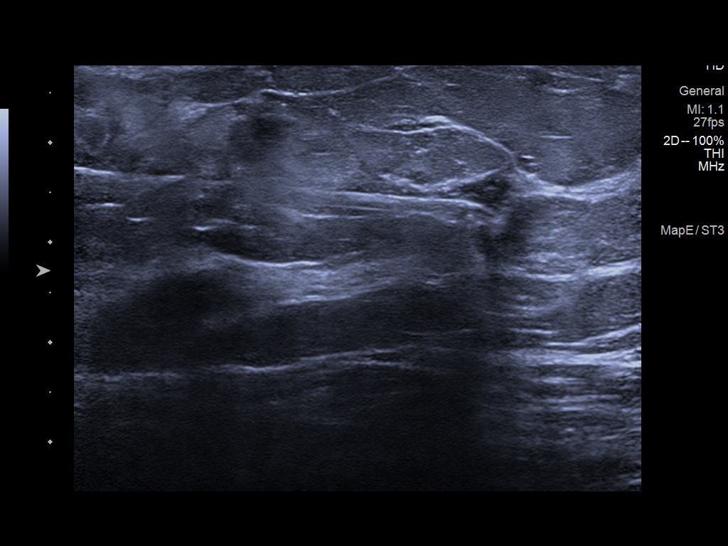
[im 5/12]
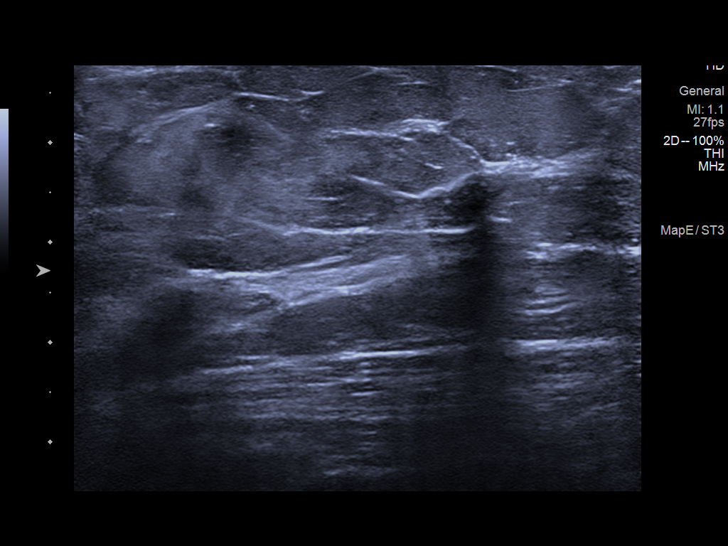
[im 6/12]
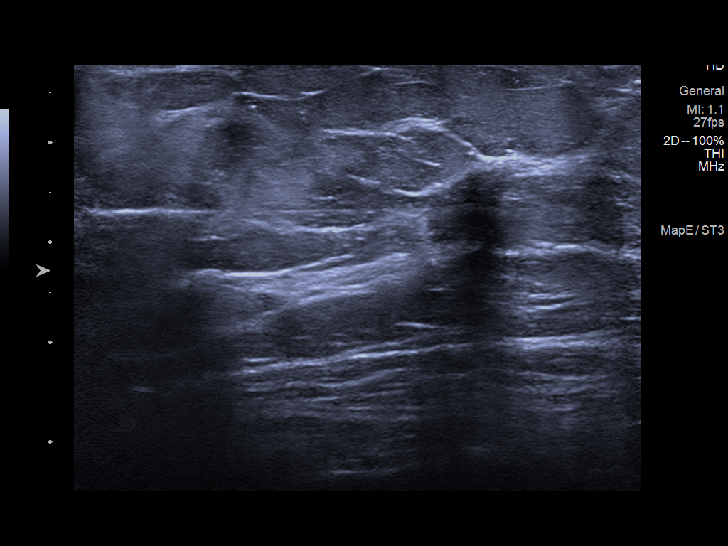
[im 7/12]
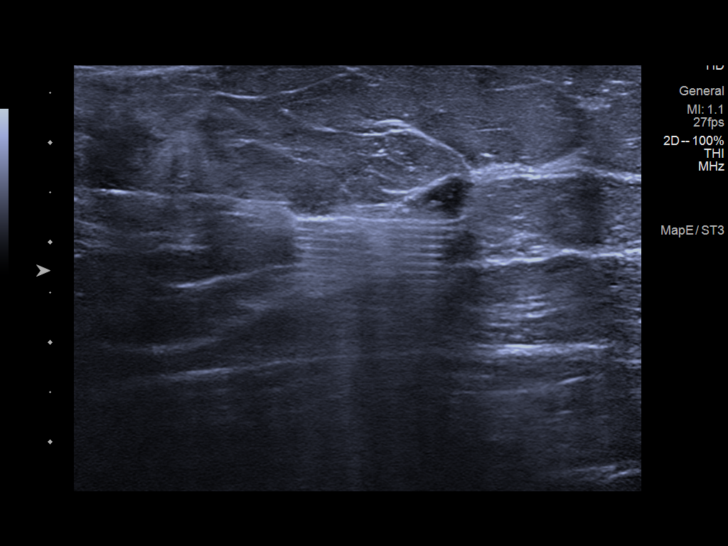
[im 8/12]
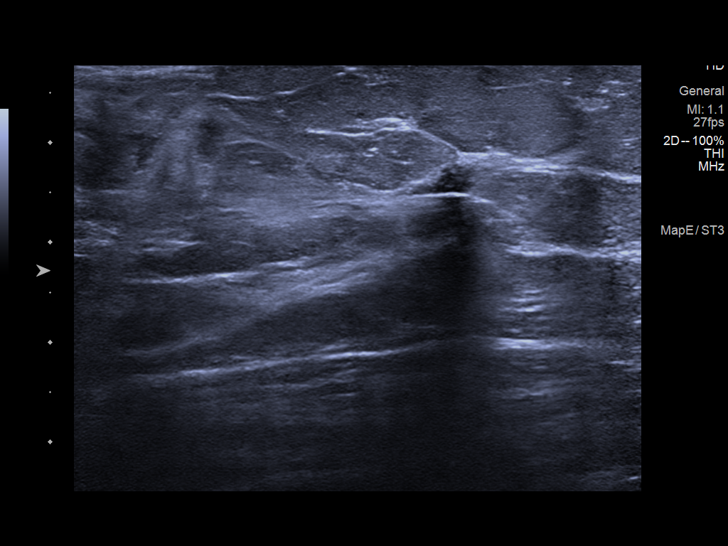
[im 9/12]
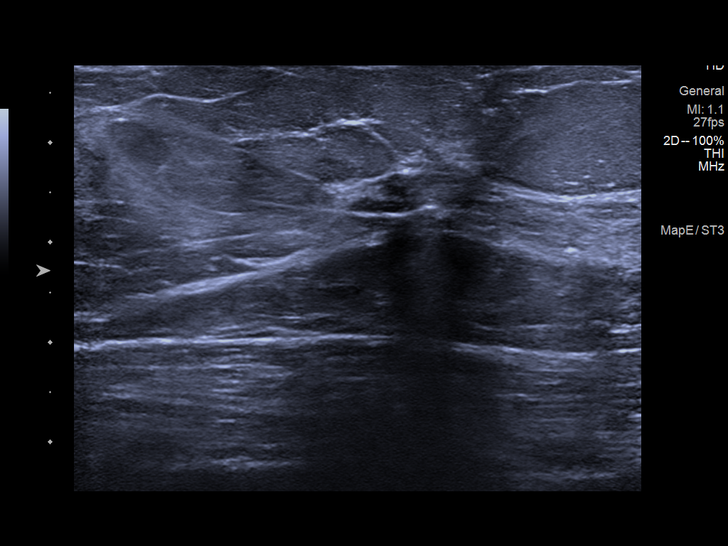
[im 10/12]
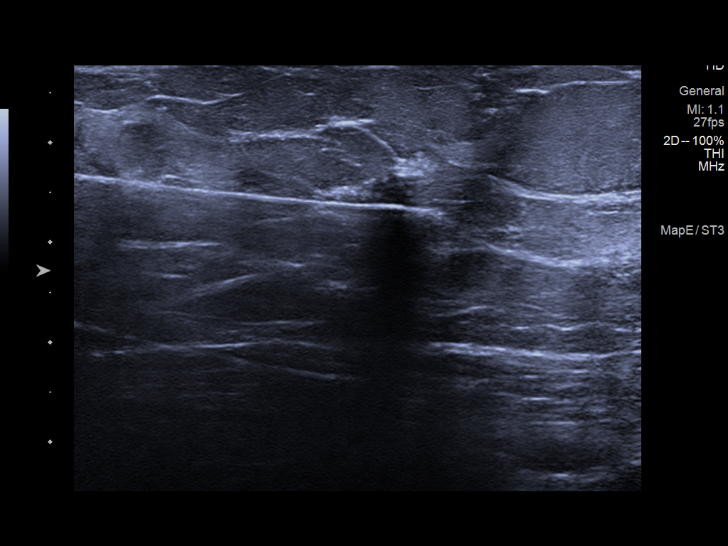
[im 11/12]
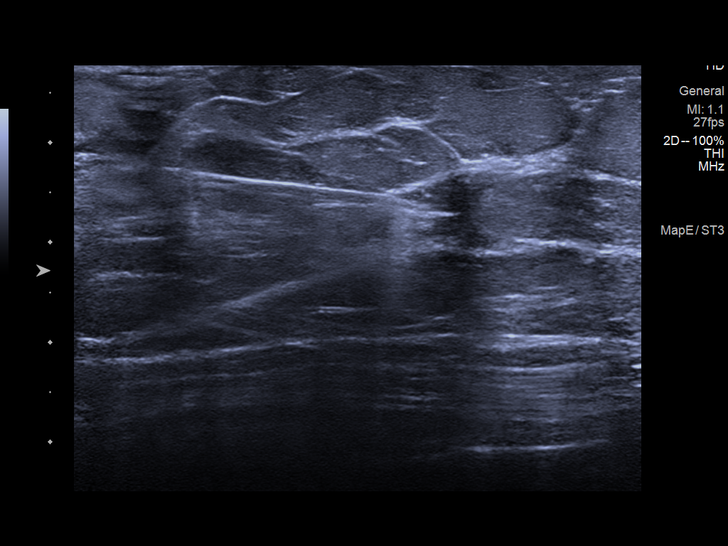
[im 12/12]
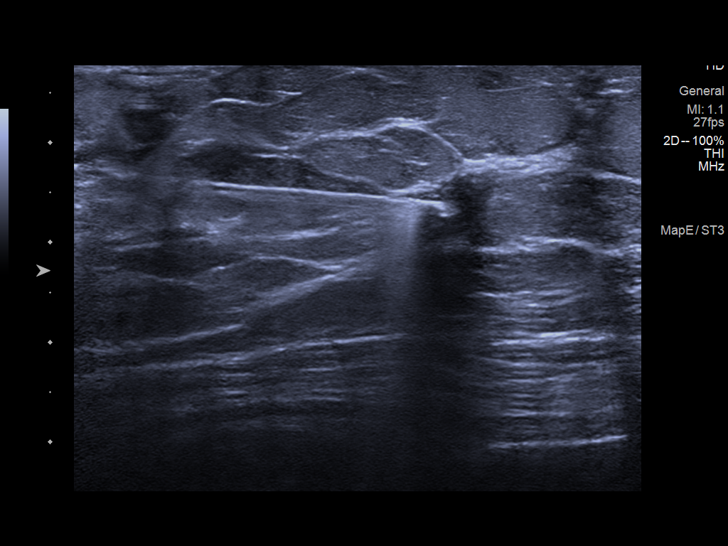

[12 of 12 positions shown; findings below may reference images not displayed]



Lesion quadrant: Upper-outer

Using sterile technique and 1% Lidocaine as local anesthetic, under
direct ultrasound visualization, a 14 gauge ALVEZ device was
used to perform biopsy of the mass in the right breast at the 10
o'clock position using a lateral to medial approach. At the
conclusion of the procedure ribbon shaped tissue marker clip was
deployed into the biopsy cavity. Follow up 2 view mammogram was
performed and dictated separately.
IMPRESSION: Ultrasound guided biopsy of the mass in the right breast at the 10
o'clock position. No apparent complications.

ADDENDUM:
PATHOLOGY revealed: A. BREAST, [DATE], RIGHT, BIOPSY: - Invasive
mammary carcinoma.- Mammary carcinoma in situ.

Comment: The biopsy material shows an infiltrative proliferation of
cells with large vesicular nuclei with inconspicuous nucleoli,
arranged linearly and in small clusters. Based on the biopsy, the
carcinoma appears ALVEZ grade 2 of 3 and measures 0.7 cm in
greatest linear extent.

Pathology results are CONCORDANT with imaging findings, per Dr.
ALVEZ.

Pathology results and recommendations below were discussed with
patient by telephone on [DATE]. Patient reported biopsy site
within normal limits with slight tenderness at the site. Post biopsy
care instructions were reviewed, questions were answered and my
direct phone number was provided to patient. Patient was instructed
to call [HOSPITAL] [HOSPITAL] Mammography Department if
any concerns or questions arise related to the biopsy.

Recommendation: Request for surgical consultation was relayed to
ALVEZ RT at [HOSPITAL] [HOSPITAL] Mammography Department
by ALVEZ RN on [DATE].

Pathology results reported by ALVEZ RN on [DATE].



Lesion quadrant: Upper-outer

Using sterile technique and 1% Lidocaine as local anesthetic, under
direct ultrasound visualization, a 14 gauge ALVEZ device was
used to perform biopsy of the mass in the right breast at the 10
o'clock position using a lateral to medial approach. At the
conclusion of the procedure ribbon shaped tissue marker clip was
deployed into the biopsy cavity. Follow up 2 view mammogram was
performed and dictated separately.
IMPRESSION: Ultrasound guided biopsy of the mass in the right breast at the 10
o'clock position. No apparent complications.

## 2020-08-12 MED ORDER — LIDOCAINE HCL (PF) 1 % IJ SOLN
INTRAMUSCULAR | Status: AC
  Filled 2020-08-12: qty 5

## 2020-08-12 MED ORDER — LIDOCAINE-EPINEPHRINE (PF) 1 %-1:200000 IJ SOLN
INTRAMUSCULAR | Status: AC
  Filled 2020-08-12: qty 30

## 2020-08-18 LAB — SURGICAL PATHOLOGY

## 2020-08-22 ENCOUNTER — Ambulatory Visit: Payer: Self-pay | Admitting: Surgery

## 2020-08-22 DIAGNOSIS — C50911 Malignant neoplasm of unspecified site of right female breast: Secondary | ICD-10-CM

## 2020-08-22 NOTE — H&P (Signed)
Monica [name] Appointment: 08/22/2020 9:40 AM Location: [facility] Patient #: [id number] DOB: 1971-01-24 Single / Language: Monica [name] / Race: Black or African American Female  History of Present Illness Monica [name] A. Monica Pangallo MD; 08/22/2020 10:47 AM) Patient words: Patient presents for evaluation of right breast cancer. She underwent a mammogram and subcutaneous ultrasound which showed an 8 mm right upper quadrant distortion. 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 Receptors are pending at time of dictation. She has a family history of 2 first-degree relatives with breast cancer. Patient denies any history of breast pain, nipple discharge or change in either breast.      Screening recall for possible right breast asymmetry.  EXAM: DIGITAL DIAGNOSTIC UNILATERAL RIGHT MAMMOGRAM WITH TOMO AND CAD; ULTRASOUND RIGHT BREAST LIMITED  COMPARISON: Previous exams.  ACR Breast Density Category b: There are scattered areas of fibroglandular density.  FINDINGS: Spot compression tomograms were performed over the upper-outer right breast. There is a persistent mass with associated distortion in the upper-outer right breast measuring approximately 0.8 cm.  Mammographic images were processed with CAD.  Targeted ultrasound of the upper-outer right breast was performed. There is an irregular hypoechoic mass at 10 o'clock 8 cm from nipple measuring 0.8 x 0.5 x 0.6 cm. This corresponds well with the mass/distortion seen in the upper-outer right breast. No lymphadenopathy seen in the right axilla.  IMPRESSION: Suspicious 0.8 cm right breast mass.  RECOMMENDATION: Recommend ultrasound-guided biopsy of the mass in the right breast the 10 o'clock position.  I have discussed the findings and recommendations with the patient. If applicable, a reminder letter will be sent to the patient regarding the next appointment.  BI-RADS CATEGORY 4:  Suspicious.           10 d ago Surveyor, mining for Addendum #1: Immunohistochemistry results Reason for Addendum #2: Breast Biomarker Results Reason for Addendum #3: Breast Biomarker Results Clinical History: mass FINAL MICROSCOPIC DIAGNOSIS: A. BREAST, 10:00, RIGHT, BIOPSY: - Invasive mammary carcinoma - Mammary carcinoma in situ - See comment COMMENT: The biopsy material shows an infiltrative proliferation of cells with large vesicular nuclei with inconspicuous nucleoli, arranged linearly and in small clusters. Based on the biopsy, the carcinoma appears Nottingham grade 2 of 3 and measures 0.7 cm in greatest linear extent. E-cadherin and prognostic markers (ER/PR/ki-67/HER2) are pending and will be reported in an addendum. Dr. Vic [name] reviewed the case and agrees with the above diagnosis. Monica [name] was notified of these results on August 13, 2020. GROSS DESCRIPTION: The specimen is received in formalin labeled with the patient's name and right breast, and consists of 6 cores of tan-yellow fibroadipose tissue, ranging from 0.6 x 0.1 x 0.1 cm to 1.8 x 0.2 x 0.1 cm. The specimen is entirely submitted in 1 cassette. Time in formalin 1:25 PM on 08/12/2020. Cold ischemic time less than 1 minute. (Per EPIC) Monica [name] 08/18/2020) Final Diagnosis performed by Monica Sheller, MD. Electronically signed 08/13/2020 Technical component performed at [facility], [place] [address] [place], [place] [address]. Professional component performed at [employer]. [facility], [place] [address] [place], [place] [address]. Immunohistochemistry Technical component (if applicable) was performed at [facility]. [address] [place], [place], [place] [address]. IMMUNOHISTOCHEMISTRY DISCLAIMER (if applicable): Some of these immunohistochemical stains may have been developed and the performance characteristics determine by [facility]. Some may not have been cleared or approved by the U.S. Food and Drug Administration. The FDA has determined that such clearance or approval  is not necessary. This test is used for clinical purposes. It should not be regarded as investigational or for research. This laboratory is certified under the [place] (CLIA-88) as qualified to perform high complexity clinical laboratory testing. The controls stained appropriately. ADDENDUM: E-cadherin is has weak positivity, lobular origin is favored.  The patient is a 49 year old female.   Past Surgical History Monica [name], [place]; 08/22/2020 9:42 AM) Appendectomy Breast Biopsy Right. Cesarean Section - 1  Diagnostic Studies History Monica [name], [place]; 08/22/2020 9:42 AM) Colonoscopy never Mammogram within last year Pap Smear 1-5 years ago  Allergies Monica [name], [place]; 08/22/2020 9:43 AM) No Known Drug Allergies [08/22/2020]:  Medication History (Monica [name], CMA; 08/22/2020 9:43 AM) Lisinopril (10MG Tablet, Oral) Active. Loratadine (10MG Tablet, Oral) Active. Vitamin D3 (125 MCG(5000 UT) Tablet, Oral) Active. Aspirin (81MG Tablet, Oral) Active. Medications Reconciled  Social History Monica [name], CMA; 08/22/2020 9:42 AM) Alcohol use Occasional alcohol use. Caffeine use Carbonated beverages, Coffee, Tea. No drug use Tobacco use Current every day smoker.  Family History Monica [name], [place]; 08/22/2020 9:42 AM) Diabetes Mellitus Father. Hypertension Father, Sister. Thyroid problems Daughter.  Pregnancy / Birth History Monica [name], [place]; 08/22/2020 9:42 AM) Age at menarche 24 years. Gravida 1 Maternal age 39-25 Para 1  Other Problems Monica [name], [place]; 08/22/2020 9:42 AM) Arthritis Breast Cancer High blood pressure     Review of Systems (Monica [name] CMA; 08/22/2020 9:42 AM) General Not Present- Appetite Loss, Chills, Fatigue, Fever,  Night Sweats, Weight Gain and Weight Loss. Skin Not Present- Change in Wart/Mole, Dryness, Hives, Jaundice, New Lesions, Non-Healing Wounds, Rash and Ulcer. HEENT Present- Seasonal Allergies and Wears glasses/contact lenses. Not Present- Earache, Hearing Loss, Hoarseness, Nose Bleed, Oral Ulcers, Ringing in the Ears, Sinus Pain, Sore Throat, Visual Disturbances and Yellow Eyes. Respiratory Present- Snoring. Not Present- Bloody sputum, Chronic Cough, Difficulty Breathing and Wheezing. Breast Present- Breast Mass. Not Present- Breast Pain, Nipple Discharge and Skin Changes. Cardiovascular Not Present- Chest Pain, Difficulty Breathing Lying Down, Leg Cramps, Palpitations, Rapid Heart Rate, Shortness of Breath and Swelling of Extremities. Gastrointestinal Not Present- Abdominal Pain, Bloating, Bloody Stool, Change in Bowel Habits, Chronic diarrhea, Constipation, Difficulty Swallowing, Excessive gas, Gets full quickly at meals, Hemorrhoids, Indigestion, Nausea, Rectal Pain and Vomiting.  Vitals (Monica [name] CMA; 08/22/2020 9:43 AM) 08/22/2020 9:42 AM Weight: 186.38 lb Height: 61in Body Surface Area: 1.83 m Body Mass Index: 35.21 kg/m  Temp.: 98.73F  Pulse: 79 (Regular)  P.OX: 96% (Room air) BP: 138/98(Sitting, Left Arm, Standard)        Physical Exam (Monica Kring A. Bobbie Valletta MD; 08/22/2020 10:47 AM)  General Mental Status-Alert. General Appearance-Consistent with stated age. Hydration-Well hydrated. Voice-Normal.  Head and Neck Head-normocephalic, atraumatic with no lesions or palpable masses. Trachea-midline. Thyroid Gland Characteristics - normal size and consistency.  Eye Eyeball - Bilateral-Extraocular movements intact. Sclera/Conjunctiva - Bilateral-No scleral icterus.  Chest and Lung Exam Chest and lung exam reveals -quiet, even and easy respiratory effort with no use of accessory muscles and on auscultation, normal breath sounds, no adventitious  sounds and normal vocal resonance. Inspection Chest Wall - Normal. Back - normal.  Breast Breast - Left-Symmetric, Non Tender, No Biopsy scars, no Dimpling - Left, No Inflammation, No Lumpectomy scars, No Mastectomy scars, No Peau d' Orange. Breast - Right-Symmetric, Non Tender, No Biopsy scars, no Dimpling - Right, No Inflammation, No Lumpectomy scars, No Mastectomy scars, No Peau d' Orange. Breast Lump-No Palpable Breast Mass.  Cardiovascular Cardiovascular examination reveals -  normal heart sounds, regular rate and rhythm with no murmurs and normal pedal pulses bilaterally.  Abdomen Inspection Inspection of the abdomen reveals - No Hernias. Skin - Scar - no surgical scars. Palpation/Percussion Palpation and Percussion of the abdomen reveal - Soft, Non Tender, No Rebound tenderness, No Rigidity (guarding) and No hepatosplenomegaly. Auscultation Auscultation of the abdomen reveals - Bowel sounds normal.  Neurologic Neurologic evaluation reveals -alert and oriented x 3 with no impairment of recent or remote memory. Mental Status-Normal.  Musculoskeletal Normal Exam - Left-Upper Extremity Strength Normal and Lower Extremity Strength Normal. Normal Exam - Right-Upper Extremity Strength Normal and Lower Extremity Strength Normal.  Lymphatic Head & Neck  General Head & Neck Lymphatics: Bilateral - Description - Normal. Axillary  General Axillary Region: Bilateral - Description - Normal. Tenderness - Non Tender. Femoral & Inguinal  Generalized Femoral & Inguinal Lymphatics: Bilateral - Description - Normal. Tenderness - Non Tender.    Assessment & Plan (Monica Quilling A. Jhoselyn Ruffini MD; 08/22/2020 10:49 AM)  BREAST CANCER, STAGE 1, RIGHT (C50.911) Impression: Refer to medical oncology, radiation oncology, genetics. 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 This will depend upon magnetic resonance imaging and genetics as well. Risks, benefits and the treatment options discussed for breast cancer as well as other surgical options of breast this deck to me versus breast conservation surgery. She is additional breast conserving surgery and we will proceed with scheduling right breast seed localized lumpectomy with right axillary sentinel lymph node mapping. Some this will depend upon her imaging and consultations but hopefully she will not have much change from that standpoint. Risk of lumpectomy include bleeding, infection, seroma, more surgery, use of seed/wire, wound care, cosmetic deformity and the need for other treatments, death , blood clots, death. Pt agrees to proceed. Risk of sentinel lymph node mapping include bleeding, infection, lymphedema, shoulder pain. stiffness, dye allergy. cosmetic deformity , blood clots, death, need for more surgery. Pt agrees to proceed.   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  You should hear from our office's scheduling department within 5 working days about the location, date, and time of surgery. We try to make accommodations for patient's preferences in scheduling surgery, but sometimes the OR schedule or the surgeon's schedule prevents [place] from making those accommodations.  If you have not heard from our office [phone]) in 5 working days, call the office and ask for your surgeon's nurse.  If you have other questions about your diagnosis, plan, or surgery, call the office and ask for your surgeon's nurse.  Pt Education - [facility] Breast Cancer Information Given - [employer] "Breast Journey" Package Pt Education - Pamphlet Given - Breast Biopsy: discussed with patient and provided information. We  discussed the staging and pathophysiology of breast cancer. We discussed all of the different options for treatment for breast cancer including surgery, chemotherapy, radiation therapy, Herceptin, and antiestrogen therapy. We discussed a sentinel lymph node biopsy as she does not appear to having lymph node involvement right now. We discussed the performance of that with injection of radioactive tracer and blue dye. We discussed that she would have an incision underneath her axillary hairline. We discussed that there is a bout a 10-20% chance of having a positive node with  a sentinel lymph node biopsy and we will await the permanent pathology to make any other first further decisions in terms of her treatment. One of these options might be to return to the operating room to perform an axillary lymph node dissection. We discussed about a 1-2% risk lifetime of chronic shoulder pain as well as lymphedema associated with a sentinel lymph node biopsy. We discussed the options for treatment of the breast cancer which included lumpectomy versus a mastectomy. We discussed the performance of the lumpectomy with a wire placement. We discussed a 10-20% chance of a positive margin requiring reexcision in the operating room. We also discussed that she may need radiation therapy or antiestrogen therapy or both if she undergoes lumpectomy. We discussed the mastectomy and the postoperative care for that as well. We discussed that there is no difference in her survival whether she undergoes lumpectomy with radiation therapy or antiestrogen therapy versus a mastectomy. There is a slight difference in the local recurrence rate being 3-5% with lumpectomy and about 1% with a mastectomy. We discussed the risks of operation including bleeding, infection, possible reoperation. She understands her further therapy will be based on what her stages at the time of her operation.  Pt Education - [facility]b breast cancer surgery: discussed with  patient and provided information. Pt Education - ABC (After Breast Cancer) Class Info: discussed with patient and provided information. Pt Education - [facility] Breast Pains Education

## 2020-08-22 NOTE — H&P (View-Only) (Signed)
Monica Monica [name] Appointment: 08/22/2020 9:40 AM Location: [facility] Patient #: [id number] DOB: Apr 30, 1971 Single / Language: [name] / Race: Black or African American Female  History of Present Illness Monica Monica [name] A. Monica Kostka MD; 08/22/2020 10:47 AM) Patient words: Patient presents for evaluation of right breast cancer. She underwent a mammogram and subcutaneous ultrasound which showed an 8 mm right upper quadrant distortion. 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 Receptors are pending at time of dictation. She has a family history of 2 first-degree relatives with breast cancer. Patient denies any history of breast pain, nipple discharge or change in either breast.      Screening recall for possible right breast asymmetry.  EXAM: DIGITAL DIAGNOSTIC UNILATERAL RIGHT MAMMOGRAM WITH TOMO AND CAD; ULTRASOUND RIGHT BREAST LIMITED  COMPARISON: Previous exams.  ACR Breast Density Category b: There are scattered areas of fibroglandular density.  FINDINGS: Spot compression tomograms were performed over the upper-outer right breast. There is a persistent mass with associated distortion in the upper-outer right breast measuring approximately 0.8 cm.  Mammographic images were processed with CAD.  Targeted ultrasound of the upper-outer right breast was performed. There is an irregular hypoechoic mass at 10 o'clock 8 cm from nipple measuring 0.8 x 0.5 x 0.6 cm. This corresponds well with the mass/distortion seen in the upper-outer right breast. No lymphadenopathy seen in the right axilla.  IMPRESSION: Suspicious 0.8 cm right breast mass.  RECOMMENDATION: Recommend ultrasound-guided biopsy of the mass in the right breast the 10 o'clock position.  I have discussed the findings and recommendations with the patient. If applicable, a reminder letter will be sent to the patient regarding the next appointment.  BI-RADS CATEGORY 4:  Suspicious.           10 d ago Surveyor, mining for Addendum #1: Immunohistochemistry results Reason for Addendum #2: Breast Biomarker Results Reason for Addendum #3: Breast Biomarker Results Clinical History: mass FINAL MICROSCOPIC DIAGNOSIS: A. BREAST, 10:00, RIGHT, BIOPSY: - Invasive mammary carcinoma - Mammary carcinoma in situ - See comment COMMENT: The biopsy material shows an infiltrative proliferation of cells with large vesicular nuclei with inconspicuous nucleoli, arranged linearly and in small clusters. Based on the biopsy, the carcinoma appears Nottingham grade 2 of 3 and measures 0.7 cm in greatest linear extent. E-cadherin and prognostic markers (ER/PR/ki-67/HER2) are pending and will be reported in an addendum. Dr. Vic Ripper reviewed the case and agrees with the above diagnosis. Monica Monica [name] was notified of these results on August 13, 2020. GROSS DESCRIPTION: The specimen is received in formalin labeled with the patient's name and right breast, and consists of 6 cores of tan-yellow fibroadipose tissue, ranging from 0.6 x 0.1 x 0.1 cm to 1.8 x 0.2 x 0.1 cm. The specimen is entirely submitted in 1 cassette. Time in formalin 1:25 PM on 08/12/2020. Cold ischemic time less than 1 minute. (Per EPIC) Monica Monica [name] 08/18/2020) Final Diagnosis performed by Monica Sheller, MD. Electronically signed 08/13/2020 Technical component performed at [facility], [place] [address] [place], [place] [address]. Professional component performed at [employer]. [facility], [place] [address], [place], [place] [address]. Immunohistochemistry Technical component (if applicable) was performed at [facility]. [address], [place], [place], [place] [address]. IMMUNOHISTOCHEMISTRY DISCLAIMER (if applicable): Some of these immunohistochemical stains may have been developed and the performance characteristics determine by [facility]. Some may not have been cleared or approved by the U.S. Food and Drug Administration. The FDA has determined that such clearance or approval  is not necessary. This test is used for clinical purposes. It should not be regarded as investigational or for research. This laboratory is certified under the [place] (CLIA-88) as qualified to perform high complexity clinical laboratory testing. The controls stained appropriately. ADDENDUM: E-cadherin is has weak positivity, lobular origin is favored.  The patient is a 49 year old female.   Past Surgical History Andreas Blower, [place]; 08/22/2020 9:42 AM) Appendectomy Breast Biopsy Right. Cesarean Section - 1  Diagnostic Studies History Andreas Blower, [place]; 08/22/2020 9:42 AM) Colonoscopy never Mammogram within last year Pap Smear 1-5 years ago  Allergies Andreas Blower, [place]; 08/22/2020 9:43 AM) No Known Drug Allergies [08/22/2020]:  Medication History (Armen Ferguson, CMA; 08/22/2020 9:43 AM) Lisinopril (10MG Tablet, Oral) Active. Loratadine (10MG Tablet, Oral) Active. Vitamin D3 (125 MCG(5000 UT) Tablet, Oral) Active. Aspirin (81MG Tablet, Oral) Active. Medications Reconciled  Social History Andreas Blower, CMA; 08/22/2020 9:42 AM) Alcohol use Occasional alcohol use. Caffeine use Carbonated beverages, Coffee, Tea. No drug use Tobacco use Current every day smoker.  Family History Andreas Blower, [place]; 08/22/2020 9:42 AM) Diabetes Mellitus Father. Hypertension Father, Sister. Thyroid problems Daughter.  Pregnancy / Birth History Andreas Blower, [place]; 08/22/2020 9:42 AM) Age at menarche 55 years. Gravida 1 Maternal age 74-25 Para 1  Other Problems Andreas Blower, [place]; 08/22/2020 9:42 AM) Arthritis Breast Cancer High blood pressure     Review of Systems (Armen Ferguson CMA; 08/22/2020 9:42 AM) General Not Present- Appetite Loss, Chills, Fatigue, Fever,  Night Sweats, Weight Gain and Weight Loss. Skin Not Present- Change in Wart/Mole, Dryness, Hives, Jaundice, New Lesions, Non-Healing Wounds, Rash and Ulcer. HEENT Present- Seasonal Allergies and Wears glasses/contact lenses. Not Present- Earache, Hearing Loss, Hoarseness, Nose Bleed, Oral Ulcers, Ringing in the Ears, Sinus Pain, Sore Throat, Visual Disturbances and Yellow Eyes. Respiratory Present- Snoring. Not Present- Bloody sputum, Chronic Cough, Difficulty Breathing and Wheezing. Breast Present- Breast Mass. Not Present- Breast Pain, Nipple Discharge and Skin Changes. Cardiovascular Not Present- Chest Pain, Difficulty Breathing Lying Down, Leg Cramps, Palpitations, Rapid Heart Rate, Shortness of Breath and Swelling of Extremities. Gastrointestinal Not Present- Abdominal Pain, Bloating, Bloody Stool, Change in Bowel Habits, Chronic diarrhea, Constipation, Difficulty Swallowing, Excessive gas, Gets full quickly at meals, Hemorrhoids, Indigestion, Nausea, Rectal Pain and Vomiting.  Vitals (Armen Ferguson CMA; 08/22/2020 9:43 AM) 08/22/2020 9:42 AM Weight: 186.38 lb Height: 61in Body Surface Area: 1.83 m Body Mass Index: 35.21 kg/m  Temp.: 98.23F  Pulse: 79 (Regular)  P.OX: 96% (Room air) BP: 138/98(Sitting, Left Arm, Standard)        Physical Exam (Acel Natzke A. Jhair Witherington MD; 08/22/2020 10:47 AM)  General Mental Status-Alert. General Appearance-Consistent with stated age. Hydration-Well hydrated. Voice-Normal.  Head and Neck Head-normocephalic, atraumatic with no lesions or palpable masses. Trachea-midline. Thyroid Gland Characteristics - normal size and consistency.  Eye Eyeball - Bilateral-Extraocular movements intact. Sclera/Conjunctiva - Bilateral-No scleral icterus.  Chest and Lung Exam Chest and lung exam reveals -quiet, even and easy respiratory effort with no use of accessory muscles and on auscultation, normal breath sounds, no adventitious  sounds and normal vocal resonance. Inspection Chest Wall - Normal. Back - normal.  Breast Breast - Left-Symmetric, Non Tender, No Biopsy scars, no Dimpling - Left, No Inflammation, No Lumpectomy scars, No Mastectomy scars, No Peau d' Orange. Breast - Right-Symmetric, Non Tender, No Biopsy scars, no Dimpling - Right, No Inflammation, No Lumpectomy scars, No Mastectomy scars, No Peau d' Orange. Breast Lump-No Palpable Breast Mass.  Cardiovascular Cardiovascular examination reveals -  normal heart sounds, regular rate and rhythm with no murmurs and normal pedal pulses bilaterally.  Abdomen Inspection Inspection of the abdomen reveals - No Hernias. Skin - Scar - no surgical scars. Palpation/Percussion Palpation and Percussion of the abdomen reveal - Soft, Non Tender, No Rebound tenderness, No Rigidity (guarding) and No hepatosplenomegaly. Auscultation Auscultation of the abdomen reveals - Bowel sounds normal.  Neurologic Neurologic evaluation reveals -alert and oriented x 3 with no impairment of recent or remote memory. Mental Status-Normal.  Musculoskeletal Normal Exam - Left-Upper Extremity Strength Normal and Lower Extremity Strength Normal. Normal Exam - Right-Upper Extremity Strength Normal and Lower Extremity Strength Normal.  Lymphatic Head & Neck  General Head & Neck Lymphatics: Bilateral - Description - Normal. Axillary  General Axillary Region: Bilateral - Description - Normal. Tenderness - Non Tender. Femoral & Inguinal  Generalized Femoral & Inguinal Lymphatics: Bilateral - Description - Normal. Tenderness - Non Tender.    Assessment & Plan (Ilina Xu A. Venkat Ankney MD; 08/22/2020 10:49 AM)  BREAST CANCER, STAGE 1, RIGHT (C50.911) Impression: Refer to medical oncology, radiation oncology, genetics. 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 This will depend upon magnetic resonance imaging and genetics as well. Risks, benefits and the treatment options discussed for breast cancer as well as other surgical options of breast this deck to me versus breast conservation surgery. She is additional breast conserving surgery and we will proceed with scheduling right breast seed localized lumpectomy with right axillary sentinel lymph node mapping. Some this will depend upon her imaging and consultations but hopefully she will not have much change from that standpoint. Risk of lumpectomy include bleeding, infection, seroma, more surgery, use of seed/wire, wound care, cosmetic deformity and the need for other treatments, death , blood clots, death. Pt agrees to proceed. Risk of sentinel lymph node mapping include bleeding, infection, lymphedema, shoulder pain. stiffness, dye allergy. cosmetic deformity , blood clots, death, need for more surgery. Pt agrees to proceed.   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  You should hear from our office's scheduling department within 5 working days about the location, date, and time of surgery. We try to make accommodations for patient's preferences in scheduling surgery, but sometimes the OR schedule or the surgeon's schedule prevents [place] from making those accommodations.  If you have not heard from our office [phone]) in 5 working days, call the office and ask for your surgeon's nurse.  If you have other questions about your diagnosis, plan, or surgery, call the office and ask for your surgeon's nurse.  Pt Education - [facility] Breast Cancer Information Given - [employer] "Breast Journey" Package Pt Education - Pamphlet Given - Breast Biopsy: discussed with patient and provided information. We  discussed the staging and pathophysiology of breast cancer. We discussed all of the different options for treatment for breast cancer including surgery, chemotherapy, radiation therapy, Herceptin, and antiestrogen therapy. We discussed a sentinel lymph node biopsy as she does not appear to having lymph node involvement right now. We discussed the performance of that with injection of radioactive tracer and blue dye. We discussed that she would have an incision underneath her axillary hairline. We discussed that there is a bout a 10-20% chance of having a positive node with  a sentinel lymph node biopsy and we will await the permanent pathology to make any other first further decisions in terms of her treatment. One of these options might be to return to the operating room to perform an axillary lymph node dissection. We discussed about a 1-2% risk lifetime of chronic shoulder pain as well as lymphedema associated with a sentinel lymph node biopsy. We discussed the options for treatment of the breast cancer which included lumpectomy versus a mastectomy. We discussed the performance of the lumpectomy with a wire placement. We discussed a 10-20% chance of a positive margin requiring reexcision in the operating room. We also discussed that she may need radiation therapy or antiestrogen therapy or both if she undergoes lumpectomy. We discussed the mastectomy and the postoperative care for that as well. We discussed that there is no difference in her survival whether she undergoes lumpectomy with radiation therapy or antiestrogen therapy versus a mastectomy. There is a slight difference in the local recurrence rate being 3-5% with lumpectomy and about 1% with a mastectomy. We discussed the risks of operation including bleeding, infection, possible reoperation. She understands her further therapy will be based on what her stages at the time of her operation.  Pt Education - [facility] breast cancer surgery: discussed with  patient and provided information. Pt Education - ABC (After Breast Cancer) Class Info: discussed with patient and provided information. Pt Education - [facility] Breast Pains Education

## 2020-08-25 ENCOUNTER — Other Ambulatory Visit: Payer: Self-pay | Admitting: Surgery

## 2020-08-25 ENCOUNTER — Other Ambulatory Visit (HOSPITAL_COMMUNITY): Payer: Self-pay | Admitting: Surgery

## 2020-08-25 ENCOUNTER — Telehealth: Payer: Self-pay | Admitting: Hematology and Oncology

## 2020-08-25 DIAGNOSIS — C50911 Malignant neoplasm of unspecified site of right female breast: Secondary | ICD-10-CM

## 2020-08-25 NOTE — Progress Notes (Signed)
Location of Breast Cancer: Malignant neoplasm of RIGHT breast, estrogen receptor positive  Histology per Pathology Report: (defintive pathology pending; lumpectomy scheduled for 09/10/2020) 08/12/2020 FINAL MICROSCOPIC DIAGNOSIS:  A. BREAST, 10:00, RIGHT, BIOPSY:  - Invasive mammary carcinoma  - Mammary carcinoma in situ  - See comment  COMMENT:  The biopsy material shows an infiltrative proliferation of cells with  large vesicular nuclei with inconspicuous nucleoli, arranged linearly  and in small clusters. Based on the biopsy, the carcinoma appears  Nottingham grade 2 of 3 and measures 0.7 cm in greatest linear extent.   Receptor Status: ER(80%), PR (60%), Her2-neu (positive by FISH), Ki-67(15%)  Did patient present with symptoms (if so, please note symptoms) or was this found on screening mammography?:  Patient underwent a mammogram and subcutaneous ultrasound which showed an 8 mm right upper quadrant distortion.  Past/Anticipated interventions by surgeon, if any: Scheduled for lumpectomy on 09/10/2020 by Dr. Erroll Luna  Past/Anticipated interventions by medical oncology, if any: Referral made but appointment has not been scheduled  Lymphedema issues, if any:  None    Pain issues, if any:  Patient reports 5 out of 10 pain to the right breast (where biopsy was taken)   SAFETY ISSUES:  Prior radiation? No  Pacemaker/ICD? No  Possible current pregnancy? No--abdominal hysterectomy   Is the patient on methotrexate? No  Current Complaints / other details:  Scheduled for breast MRI 09/01/2020. Has not smoked any cigarettes since last Friday 08/22/20

## 2020-08-25 NOTE — Telephone Encounter (Signed)
Scheduled per 9/20 referral msg. Called and spoke with pt, confirmed 9/22 appt. Pt aware to arrive 32mins before appt and to also bring photo ID and insurance cards

## 2020-08-25 NOTE — Progress Notes (Signed)
Radiation Oncology         ([phone]) [phone] ________________________________  Initial Outpatient Consultation  Name: Monica [name] MRN: [id number]  Date: 08/26/2020  DOB: 09-Dec-1970  VF:IEPP, Noel Journey., PA-C  Erroll Luna, MD   REFERRING PHYSICIAN: Erroll Luna, MD  DIAGNOSIS:    ICD-10-CM   1. Malignant neoplasm of upper-outer quadrant of right breast in female, estrogen receptor positive ([place])  C50.411 Ambulatory referral to Social Work   Z17.0   2. Primary malignant neoplasm of upper outer quadrant of female breast, right ([place])  C50.411      Cancer Staging Primary malignant neoplasm of upper outer quadrant of female breast, right ([place]) Staging form: Breast, AJCC 8th Edition - Clinical stage from 08/26/2020: Stage IA (cT1b, cN0, cM0, G2, ER+, PR+, HER2+) - Signed by Eppie Gibson, MD on 08/27/2020    CHIEF COMPLAINT: Here to discuss management of right breast cancer  HISTORY OF Monica [name] is a 49 y.o. female who presented with right breast abnormality on the following imaging: Bilateral screening mammogram on the date of 07/07/2020. No symptoms were reported at that time. Ultrasound of breast on 07/29/2020 revealed a suspicious 0.8 cm right breast mass at 10:00.  No lymphadenopathy was seen in the right axilla on ultrasound.  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 E-cadherin had weak positivity, favoring lobular origin. ER status: 80%; PR status 60%, Her2 status positive; Grade 2.  The patient was seen in consultation with Dr. Brantley Stage on 08/22/2020, during which time they discussed surgical intervention. Bilateral breast MRI is scheduled for 09/01/2020 and right breast lumpectomy with sentinel lymph node biopsy is scheduled for 09/10/2020.  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  She wishes to receive all of her oncologic care in [place].  She quit smoking last week.   She has been vaccinated with Moderna shots x2 for COVID-19.   PREVIOUS RADIATION THERAPY: No  PAST MEDICAL HISTORY:  has a past medical history of Hypertension.    PAST SURGICAL HISTORY: Past Surgical History:  Procedure Laterality Date  . ABDOMINAL HYSTERECTOMY      FAMILY HISTORY: family history includes Breast cancer in her cousin, maternal aunt, and paternal aunt; Hypertension in her father, maternal grandmother, and sister.  SOCIAL HISTORY:  reports that she quit smoking 5 days ago. She smoked 0.50 packs per day. She has never used smokeless tobacco. She reports that she does not drink alcohol and does not use drugs.  ALLERGIES: Patient has no known allergies.  MEDICATIONS:  Current Outpatient Medications  Medication Sig Dispense Refill  . Black Currant Seed Oil 500 MG CAPS Take 1 capsule by mouth daily.    . Cholecalciferol (VITAMIN D3) 125 MCG (5000 UT) TABS Take 1 tablet by mouth daily.    Marland Kitchen lisinopril (ZESTRIL) 10 MG tablet Take 10 mg by mouth daily.     . montelukast (SINGULAIR) 10 MG tablet Take 10 mg by mouth daily.    . Multiple Vitamins-Minerals (AIRBORNE PO) Take 1 tablet by mouth daily.    Marland Kitchen acetaminophen-codeine (TYLENOL #3) 300-30 MG per tablet Take 1-2 tablets by mouth every 6 (six) hours as needed for moderate pain. (Patient not taking: Reported on 08/01/2017) 15 tablet 0  . aspirin EC 81 MG tablet Take 81 mg by mouth daily. (Patient not taking: Reported on 08/26/2020)     No current facility-administered medications for this encounter.    REVIEW OF SYSTEMS: A 10+  POINT REVIEW OF SYSTEMS WAS OBTAINED including neurology, dermatology, psychiatry, cardiac, respiratory, lymph, extremities, GI, GU, Musculoskeletal, constitutional, breasts, reproductive, HEENT.  All pertinent positives are noted in the HPI.  All others are negative.   PHYSICAL EXAM:  height is 5' 3"  (1.6 m) and weight is 189 lb (85.7 kg). Her oral temperature is 98 F (36.7 C). Her blood pressure is  131/84 and her pulse is 67. Her respiration is 20 and oxygen saturation is 99%.   General: Alert and oriented, in no acute distress Psychiatric: Judgment and insight are intact. Affect is appropriate. Breasts: No palpable axillary adenopathy or masses in the breasts bilaterally.  ECOG = 0  0 - Asymptomatic (Fully active, able to carry on all predisease activities without restriction)  1 - Symptomatic but completely ambulatory (Restricted in physically strenuous activity but ambulatory and able to carry out work of a light or sedentary nature. For example, light housework, office work)  2 - Symptomatic, <50% in bed during the day (Ambulatory and capable of all self care but unable to carry out any work activities. Up and about more than 50% of waking hours)  3 - Symptomatic, >50% in bed, but not bedbound (Capable of only limited self-care, confined to bed or chair 50% or more of waking hours)  4 - Bedbound (Completely disabled. Cannot carry on any self-care. Totally confined to bed or chair)  5 - Death   Eustace Pen MM, Creech RH, Tormey DC, et al. [phone]). "Toxicity and response criteria of the [facility]". [place]. 5 (6): 649-55   LABORATORY DATA:  No results found for: WBC, HGB, HCT, MCV, PLT CMP  No results found for: NA, K, CL, CO2, GLUCOSE, BUN, CREATININE, CALCIUM, PROT, ALBUMIN, AST, ALT, ALKPHOS, BILITOT, GFRNONAA, GFRAA       RADIOGRAPHY: US BREAST LTD UNI RIGHT INC AXILLA  Result Date: 07/29/2020 CLINICAL DATA:  Screening recall for possible right breast asymmetry. EXAM: DIGITAL DIAGNOSTIC UNILATERAL RIGHT MAMMOGRAM WITH TOMO AND CAD; ULTRASOUND RIGHT BREAST LIMITED COMPARISON:  Previous exams. ACR Breast Density Category b: There are scattered areas of fibroglandular density. FINDINGS: Spot compression tomograms were performed over the upper-outer right breast. There is a persistent mass with associated distortion in the upper-outer right breast  measuring approximately 0.8 cm. Mammographic images were processed with CAD. Targeted ultrasound of the upper-outer right breast was performed. There is an irregular hypoechoic mass at 10 o'clock 8 cm from nipple measuring 0.8 x 0.5 x 0.6 cm. This corresponds well with the mass/distortion seen in the upper-outer right breast. No lymphadenopathy seen in the right axilla. IMPRESSION: Suspicious 0.8 cm right breast mass. RECOMMENDATION: Recommend ultrasound-guided biopsy of the mass in the right breast the 10 o'clock position. I have discussed the findings and recommendations with the patient. If applicable, a reminder letter will be sent to the patient regarding the next appointment. BI-RADS CATEGORY  4: Suspicious. Electronically Signed   By: Everlean Alstrom M.D.   On: 07/29/2020 14:32   MS DIGITAL DIAG TOMO UNI RIGHT  Result Date: 07/29/2020 CLINICAL DATA:  Screening recall for possible right breast asymmetry. EXAM: DIGITAL DIAGNOSTIC UNILATERAL RIGHT MAMMOGRAM WITH TOMO AND CAD; ULTRASOUND RIGHT BREAST LIMITED COMPARISON:  Previous exams. ACR Breast Density Category b: There are scattered areas of fibroglandular density. FINDINGS: Spot compression tomograms were performed over the upper-outer right breast. There is a persistent mass with associated distortion in the upper-outer right breast measuring approximately 0.8 cm. Mammographic images were processed with CAD.  Targeted ultrasound of the upper-outer right breast was performed. There is an irregular hypoechoic mass at 10 o'clock 8 cm from nipple measuring 0.8 x 0.5 x 0.6 cm. This corresponds well with the mass/distortion seen in the upper-outer right breast. No lymphadenopathy seen in the right axilla. IMPRESSION: Suspicious 0.8 cm right breast mass. RECOMMENDATION: Recommend ultrasound-guided biopsy of the mass in the right breast the 10 o'clock position. I have discussed the findings and recommendations with the patient. If applicable, a reminder letter  will be sent to the patient regarding the next appointment. BI-RADS CATEGORY  4: Suspicious. Electronically Signed   By: Everlean Alstrom M.D.   On: 07/29/2020 14:32   MM CLIP PLACEMENT RIGHT  Result Date: 08/12/2020 CLINICAL DATA:  Post ultrasound-guided biopsy of a mass in the right breast at the 10 o'clock position. EXAM: DIAGNOSTIC RIGHT MAMMOGRAM POST ULTRASOUND BIOPSY COMPARISON:  Previous exams. FINDINGS: Mammographic images were obtained following ultrasound guided biopsy of a suspicious mass in the right breast 10 position. A ribbon shaped biopsy marking clip is present and appropriately positioned the site of the biopsied mass in the right breast at the 10 o'clock position. IMPRESSION: Ribbon shaped biopsy marking clip at site of biopsied mass in the right breast at the 10 o'clock position. Final Assessment: Post Procedure Mammograms for Marker Placement Electronically Signed   By: Everlean Alstrom M.D.   On: 08/12/2020 13:47   [place] RT BREAST BX W LOC DEV 1ST LESION IMG BX SPEC US GUIDE  Addendum Date: 08/20/2020   ADDENDUM REPORT: 08/15/2020 11:43 ADDENDUM: PATHOLOGY revealed: A. BREAST, 10:00, RIGHT, BIOPSY: - Invasive mammary carcinoma.- Mammary carcinoma in situ. Comment: The biopsy material shows an infiltrative proliferation of cells with large vesicular nuclei with inconspicuous nucleoli, arranged linearly and in small clusters. Based on the biopsy, the carcinoma appears Nottingham grade 2 of 3 and measures 0.7 cm in greatest linear extent. Pathology results are CONCORDANT with imaging findings, per Dr. Everlean Alstrom. Pathology results and recommendations below were discussed with patient by telephone on 08/14/2020. Patient reported biopsy site within normal limits with slight tenderness at the site. Post biopsy care instructions were reviewed, questions were answered and my direct phone number was provided to patient. Patient was instructed to call [facility] Mammography  Department if any concerns or questions arise related to the biopsy. Recommendation: Request for surgical consultation was relayed to Kathi Der RT at [facility] Mammography Department by Electa Sniff RN on 08/14/2020. Pathology results reported by Electa Sniff RN on 08/15/2020. Electronically Signed   By: Everlean Alstrom M.D.   On: 08/15/2020 11:43   Result Date: 08/20/2020 CLINICAL DATA:  49 year old female with a suspicious mass in the right breast at the 10 o'clock position. EXAM: ULTRASOUND GUIDED RIGHT BREAST CORE NEEDLE BIOPSY COMPARISON:  Previous exam(s). PROCEDURE: I met with the patient and we discussed the procedure of ultrasound-guided biopsy, including benefits and alternatives. We discussed the high likelihood of a successful procedure. We discussed the risks of the procedure, including infection, bleeding, tissue injury, clip migration, and inadequate sampling. Informed written consent was given. The usual time-out protocol was performed immediately prior to the procedure. Lesion quadrant: Upper-outer Using sterile technique and 1% Lidocaine as local anesthetic, under direct ultrasound visualization, a 14 gauge spring-loaded device was used to perform biopsy of the mass in the right breast at the 10 o'clock position using a lateral to medial approach. At the conclusion of the procedure ribbon shaped tissue marker  clip was deployed into the biopsy cavity. Follow up 2 view mammogram was performed and dictated separately. IMPRESSION: Ultrasound guided biopsy of the mass in the right breast at the 10 o'clock position. No apparent complications. Electronically Signed: By: Everlean Alstrom M.D. On: 08/12/2020 13:41      IMPRESSION/PLAN: This is a very nice 49 year old woman with new history of right Breast Cancer  It was a pleasure meeting the patient today.  She has a breast MRI in the near future and is going to meet medical oncology in the near future as well.  She anticipates breast  conserving surgery.  We discussed the risks, benefits, and side effects of radiotherapy. I recommend radiotherapy to the right breast to reduce her risk of locoregional recurrence by 2/3. We discussed that radiation would take approximately 4-6 weeks to complete and that I would give the patient a few weeks to heal following surgery before starting treatment planning.  If chemotherapy were to be given, this would precede radiotherapy. We spoke about acute effects including skin irritation and fatigue as well as much less common late effects including internal organ injury or irritation. We spoke about the latest technology that is used to minimize the risk of late effects for patients undergoing radiotherapy to the breast or chest wall. No guarantees of treatment were given. The patient is enthusiastic about proceeding with treatment and a consent form has been signed today. I look forward to participating in the patient's care.  I will await her referral back to me for postoperative follow-up and eventual CT simulation/treatment planning.  We discussed measures to reduce the risk of infection during the COVID-19 pandemic.  She has received 2 shots thus far. (Moderna). She may receive her flu shot and Covid booster shot in the near future.  I recommended that if she receives either shot before her MRI and her surgery, that she receive shots in the left arm.  On date of service, in total, I spent 45 minutes on this encounter. Patient was seen in person.   __________________________________________   Eppie Gibson, MD  This document serves as a record of services personally performed by Eppie Gibson, MD. It was created on his behalf by Clerance Lav, a trained medical scribe. The creation of this record is based on the scribe's personal observations and the provider's statements to them. This document has been checked and approved by the attending provider.

## 2020-08-26 ENCOUNTER — Encounter: Payer: Self-pay | Admitting: Radiation Oncology

## 2020-08-26 ENCOUNTER — Ambulatory Visit
Admission: RE | Admit: 2020-08-26 | Discharge: 2020-08-26 | Disposition: A | Discharge status: Home / Self Care | Payer: Medicaid Other | Source: Ambulatory Visit | Attending: Radiation Oncology | Admitting: Radiation Oncology

## 2020-08-26 ENCOUNTER — Other Ambulatory Visit: Payer: Self-pay

## 2020-08-26 VITALS — BP 131/84 | HR 67 | Temp 98.0°F | Resp 20 | Ht 63.0 in | Wt 189.0 lb

## 2020-08-26 DIAGNOSIS — C50411 Malignant neoplasm of upper-outer quadrant of right female breast: Secondary | ICD-10-CM | POA: Insufficient documentation

## 2020-08-26 DIAGNOSIS — Z79899 Other long term (current) drug therapy: Secondary | ICD-10-CM | POA: Diagnosis not present

## 2020-08-26 DIAGNOSIS — Z170 Estrogen receptor positive status [ER+]: Secondary | ICD-10-CM | POA: Diagnosis not present

## 2020-08-26 DIAGNOSIS — Z803 Family history of malignant neoplasm of breast: Secondary | ICD-10-CM | POA: Insufficient documentation

## 2020-08-26 DIAGNOSIS — Z7982 Long term (current) use of aspirin: Secondary | ICD-10-CM | POA: Insufficient documentation

## 2020-08-26 DIAGNOSIS — I10 Essential (primary) hypertension: Secondary | ICD-10-CM | POA: Insufficient documentation

## 2020-08-26 DIAGNOSIS — Z79811 Long term (current) use of aromatase inhibitors: Secondary | ICD-10-CM | POA: Insufficient documentation

## 2020-08-26 DIAGNOSIS — Z923 Personal history of irradiation: Secondary | ICD-10-CM | POA: Diagnosis not present

## 2020-08-26 NOTE — Progress Notes (Signed)
[facility] CONSULT NOTE  Patient Care Team: Muse, Noel Journey., PA-C as PCP - General  CHIEF COMPLAINTS/PURPOSE OF CONSULTATION:  Newly diagnosed breast cancer  HISTORY OF PRESENTING ILLNESS:  Monica [name] 49 y.o. female is here because of recent diagnosis of invasive mammary carcinoma of the right breast. Screening mammogram on 07/07/20 showed an asymmetry. Diagnostic mammogram and [place] on 07/29/20 showed a 0.8cm mass at the 10 o'clock position and no right axillary adenopathy. Biopsy on 08/12/20 showed invasive and in situ mammary carcinoma, grade 2, HER-2 equivocal by IHC, positive by FISH, ER+ 80%, PR+ 60%, Ki67 15%. She presents to the clinic today for initial evaluation and discussion of treatment options.   I reviewed her records extensively and collaborated the history with the patient.  SUMMARY OF ONCOLOGIC HISTORY: Oncology History  Primary malignant neoplasm of upper outer quadrant of female breast, right ([place])  08/12/2020 Initial Diagnosis   Screening mammogram on 07/07/20 showed an asymmetry. Diagnostic mammogram and [place] on 07/29/20 showed a 0.8cm mass at the 10 o'clock position and no right axillary adenopathy. Biopsy on 08/12/20 showed invasive and in situ mammary carcinoma, grade 2, HER-2 equivocal by IHC, positive by FISH, ER+ 80%, PR+ 60%, Ki67 15%.   08/26/2020 Cancer Staging   Staging form: Breast, AJCC 8th Edition - Clinical stage from 08/26/2020: Stage IA (cT1b, cN0, cM0, G2, ER+, PR+, HER2+) - Signed by Eppie Gibson, MD on 08/27/2020     MEDICAL HISTORY:  Past Medical History:  Diagnosis Date  . Hypertension     SURGICAL HISTORY: Past Surgical History:  Procedure Laterality Date  . ABDOMINAL HYSTERECTOMY      SOCIAL HISTORY: Social History   Socioeconomic History  . Marital status: Single    Spouse name: Not on file  . Number of children: Not on file  . Years of education: Not on file  . Highest education level: Not on file  Occupational  History  . Not on file  Tobacco Use  . Smoking status: Former Smoker    Packs/day: 0.50    Quit date: 08/22/2020    Years since quitting: 0.0  . Smokeless tobacco: Never Used  Vaping Use  . Vaping Use: Never used  Substance and Sexual Activity  . Alcohol use: No  . Drug use: Never  . Sexual activity: Not Currently    Birth control/protection: Surgical  Other Topics Concern  . Not on file  Social History Narrative  . Not on file   Social Determinants of Health   Financial Resource Strain:   . Difficulty of Paying Living Expenses: Not on file  Food Insecurity:   . Worried About Charity fundraiser in the Last Year: Not on file  . Ran Out of Food in the Last Year: Not on file  Transportation Needs:   . Lack of Transportation (Medical): Not on file  . Lack of Transportation (Non-Medical): Not on file  Physical Activity:   . Days of Exercise per Week: Not on file  . Minutes of Exercise per Session: Not on file  Stress:   . Feeling of Stress : Not on file  Social Connections:   . Frequency of Communication with Friends and Family: Not on file  . Frequency of Social Gatherings with Friends and Family: Not on file  . Attends Religious Services: Not on file  . Active Member of Clubs or Organizations: Not on file  . Attends Archivist Meetings: Not on file  . Marital Status:  Not on file  Intimate Partner Violence:   . Fear of Current or Ex-Partner: Not on file  . Emotionally Abused: Not on file  . Physically Abused: Not on file  . Sexually Abused: Not on file    FAMILY HISTORY: Family History  Problem Relation Age of Onset  . Hypertension Father   . Hypertension Sister   . Breast cancer Maternal Aunt   . Breast cancer Paternal Aunt   . Hypertension Maternal Grandmother   . Breast cancer Cousin     ALLERGIES:  has No Known Allergies.  MEDICATIONS:  Current Outpatient Medications  Medication Sig Dispense Refill  . acetaminophen-codeine (TYLENOL #3) 300-30  MG per tablet Take 1-2 tablets by mouth every 6 (six) hours as needed for moderate pain. (Patient not taking: Reported on 08/01/2017) 15 tablet 0  . aspirin EC 81 MG tablet Take 81 mg by mouth daily. (Patient not taking: Reported on 08/26/2020)    . Black Currant Seed Oil 500 MG CAPS Take 1 capsule by mouth daily.    . Cholecalciferol (VITAMIN D3) 125 MCG (5000 UT) TABS Take 1 tablet by mouth daily.    Marland Kitchen lisinopril (ZESTRIL) 10 MG tablet Take 10 mg by mouth daily.     . montelukast (SINGULAIR) 10 MG tablet Take 10 mg by mouth daily.    . Multiple Vitamins-Minerals (AIRBORNE PO) Take 1 tablet by mouth daily.     No current facility-administered medications for this visit.    REVIEW OF SYSTEMS:    All other systems were reviewed with the patient and are negative.  PHYSICAL EXAMINATION: ECOG PERFORMANCE STATUS: 1 - Symptomatic but completely ambulatory  Vitals:   08/27/20 1540  BP: 123/76  Pulse: 75  Resp: 18  Temp: 98.9 F (37.2 C)  SpO2: 100%   Filed Weights   08/27/20 1540  Weight: 188 lb 8 oz (85.5 kg)     RADIOGRAPHIC STUDIES: I have personally reviewed the radiological reports and agreed with the findings in the report.  ASSESSMENT AND PLAN:  Primary malignant neoplasm of upper outer quadrant of female breast, right ([place]) 08/12/2020:Screening mammogram on 07/07/20 showed an asymmetry. Diagnostic mammogram and [place] on 07/29/20 showed a 0.8cm mass at the 10 o'clock position and no right axillary adenopathy. Biopsy on 08/12/20 showed invasive and in situ mammary carcinoma, grade 2, HER-2 equivocal by IHC, positive by FISH, ER+ 80%, PR+ 60%, Ki67 15%.  Pathology and radiology counseling:Discussed with the patient, the details of pathology including the type of breast cancer,the clinical staging, the significance of ER, PR and HER-2/neu receptors and the implications for treatment. After reviewing the pathology in detail, we proceeded to discuss the different treatment options between  surgery, radiation, chemotherapy, antiestrogen therapies.  Recommendations: 1. Breast conserving surgery followed by 2. adjuvant chemotherapy with Taxol Herceptin followed by Herceptin maintenance for 1 year 3. Adjuvant radiation therapy followed by 4. Adjuvant antiestrogen therapy  Chemo counseling: If the final size is over 6 mm, then she will need chemo with Taxol Herceptin foll by Herceptin maintenance for 1 year.  Return to clinic after surgery to start chemotherapy.   All questions were answered. The patient knows to call the clinic with any problems, questions or concerns.   Rulon Eisenmenger, MD, MPH 08/27/2020    I, Molly Dorshimer, am acting as scribe for Nicholas Lose, MD.  I have reviewed the above documentation for accuracy and completeness, and I agree with the above.

## 2020-08-27 ENCOUNTER — Encounter: Payer: Self-pay | Admitting: Radiation Oncology

## 2020-08-27 ENCOUNTER — Inpatient Hospital Stay: Payer: Medicaid Other | Attending: Hematology and Oncology | Admitting: Hematology and Oncology

## 2020-08-27 ENCOUNTER — Other Ambulatory Visit: Payer: Self-pay | Admitting: Radiation Oncology

## 2020-08-27 ENCOUNTER — Other Ambulatory Visit: Payer: Self-pay

## 2020-08-27 ENCOUNTER — Telehealth: Payer: Self-pay | Admitting: *Deleted

## 2020-08-27 DIAGNOSIS — Z170 Estrogen receptor positive status [ER+]: Secondary | ICD-10-CM | POA: Diagnosis not present

## 2020-08-27 DIAGNOSIS — Z87891 Personal history of nicotine dependence: Secondary | ICD-10-CM | POA: Insufficient documentation

## 2020-08-27 DIAGNOSIS — Z90710 Acquired absence of both cervix and uterus: Secondary | ICD-10-CM | POA: Diagnosis not present

## 2020-08-27 DIAGNOSIS — C50411 Malignant neoplasm of upper-outer quadrant of right female breast: Secondary | ICD-10-CM | POA: Diagnosis not present

## 2020-08-27 DIAGNOSIS — Z79899 Other long term (current) drug therapy: Secondary | ICD-10-CM | POA: Insufficient documentation

## 2020-08-27 DIAGNOSIS — Z8249 Family history of ischemic heart disease and other diseases of the circulatory system: Secondary | ICD-10-CM | POA: Diagnosis not present

## 2020-08-27 DIAGNOSIS — I10 Essential (primary) hypertension: Secondary | ICD-10-CM | POA: Insufficient documentation

## 2020-08-27 DIAGNOSIS — Z803 Family history of malignant neoplasm of breast: Secondary | ICD-10-CM | POA: Insufficient documentation

## 2020-08-27 DIAGNOSIS — Z7982 Long term (current) use of aspirin: Secondary | ICD-10-CM | POA: Insufficient documentation

## 2020-08-27 NOTE — Telephone Encounter (Signed)
CALLED LAUREN SOMERS TO INFORM OF SOCIAL WORK CONSULT, LVM

## 2020-08-27 NOTE — Assessment & Plan Note (Addendum)
08/12/2020:Screening mammogram on 07/07/20 showed an asymmetry. Diagnostic mammogram and [place] on 07/29/20 showed a 0.8cm mass at the 10 o'clock position and no right axillary adenopathy. Biopsy on 08/12/20 showed invasive and in situ mammary carcinoma, grade 2, HER-2 equivocal by IHC, positive by FISH, ER+ 80%, PR+ 60%, Ki67 15%.  Pathology and radiology counseling:Discussed with the patient, the details of pathology including the type of breast cancer,the clinical staging, the significance of ER, PR and HER-2/neu receptors and the implications for treatment. After reviewing the pathology in detail, we proceeded to discuss the different treatment options between surgery, radiation, chemotherapy, antiestrogen therapies.  Recommendations: 1. Breast conserving surgery followed by 2. adjuvant chemotherapy with Taxol Herceptin followed by Herceptin maintenance for 1 year 3. Adjuvant radiation therapy followed by 4. Adjuvant antiestrogen therapy  Return to clinic after surgery to start chemotherapy.

## 2020-08-28 ENCOUNTER — Telehealth: Payer: Self-pay | Admitting: *Deleted

## 2020-08-28 ENCOUNTER — Encounter: Payer: Self-pay | Admitting: Licensed Clinical Social Worker

## 2020-08-28 ENCOUNTER — Encounter: Payer: Self-pay | Admitting: *Deleted

## 2020-08-28 NOTE — Progress Notes (Signed)
[facility] Psychosocial Distress Screening Clinical Social Work  Clinical Social Work was referred by distress screening protocol.  The patient scored a 5 on the Psychosocial Distress Thermometer which indicates moderate distress. Clinical Social Worker attempted to contact patient by phone to assess for distress and other psychosocial needs. No answer, no VM available.  ONCBCN DISTRESS SCREENING 08/26/2020  Screening Type Initial Screening  Distress experienced in past week (1-10) 5  Family Problem type Children  Emotional problem type Adjusting to illness  Information Concerns Type Lack of info about treatment;Lack of info about maintaining fitness  Physical Problem type Pain;Sleep/insomnia  Physician notified of physical symptoms Yes  Referral to clinical psychology No  Referral to clinical social work Yes  Referral to dietition No  Referral to financial advocate Yes  Referral to support programs Yes  Referral to palliative care No      Clinical Social Worker follow up needed: Yes.  CSW will attempt to contact patient again at later time.   Jencarlo Bonadonna E, LCSW

## 2020-08-28 NOTE — Telephone Encounter (Signed)
Called pt to provide navigation resources and contact information. Denies questions or concerns regarding dx or treatment care plan. Encourage pt to call with needs. Received verbal understanding.

## 2020-08-28 NOTE — Progress Notes (Signed)
[facility]  CSW spoke with patient by phone. Patient reports good support from her mom and sister. She is experiencing fear around diagnosis and would like some brief counseling. Scheduled for 09/01/20 at 10am.  CSW also provided information on support group and Physicist, medical program.   Edwinna Areola Lorenna Lurry, LCSW

## 2020-08-29 ENCOUNTER — Telehealth: Payer: Self-pay | Admitting: Hematology and Oncology

## 2020-08-29 NOTE — Telephone Encounter (Signed)
Scheduled per 9/22 los. Called and was unable to leave a msg. Mailing appt letter and calendar

## 2020-09-01 ENCOUNTER — Other Ambulatory Visit: Payer: Self-pay

## 2020-09-01 ENCOUNTER — Inpatient Hospital Stay: Payer: Medicaid Other | Admitting: Licensed Clinical Social Worker

## 2020-09-01 ENCOUNTER — Ambulatory Visit (HOSPITAL_COMMUNITY)
Admission: RE | Admit: 2020-09-01 | Discharge: 2020-09-01 | Disposition: A | Discharge status: Home / Self Care | Payer: Medicaid Other | Source: Ambulatory Visit | Attending: Surgery | Admitting: Surgery

## 2020-09-01 DIAGNOSIS — C50411 Malignant neoplasm of upper-outer quadrant of right female breast: Secondary | ICD-10-CM

## 2020-09-01 DIAGNOSIS — C50911 Malignant neoplasm of unspecified site of right female breast: Secondary | ICD-10-CM | POA: Diagnosis not present

## 2020-09-01 IMAGING — MR MR BREAST BILAT WO/W CM
6 of 10 series · 27 of 48 positions shown · IV contrast (gadavist)
Comparison: Previous exam(s).

CLINICAL DATA: 48-year-old female with new diagnosis of invasive
and in situ RIGHT breast mammary carcinoma.

LABS:  None performed today
EXAM:
BILATERAL BREAST MRI WITH AND WITHOUT CONTRAST
TECHNIQUE: Multiplanar, multisequence MR images of both breasts were obtained
prior to and following the intravenous administration of 9 ml of
Gadavist

[Series 2: T2 · axial · 3.0mm · 0.89mm/px · z∈[-92,+92]mm · 2 of 62 slices shown]
[im 1/62]
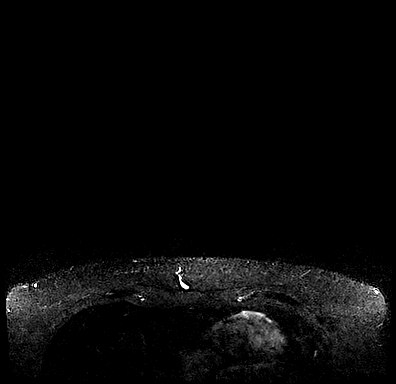
[im 62/62]
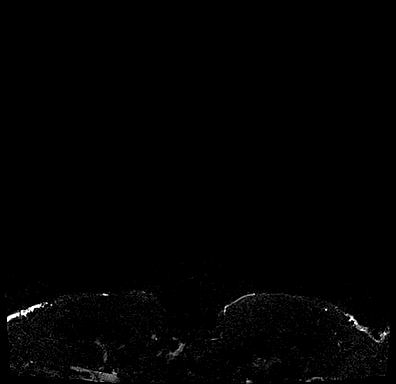

[Series 3: T1 fat-sat · axial · 1.2mm · 0.76mm/px · z∈[-95,+95]mm · 6 of 160 slices shown (1 of 4)]
[im 1/160]
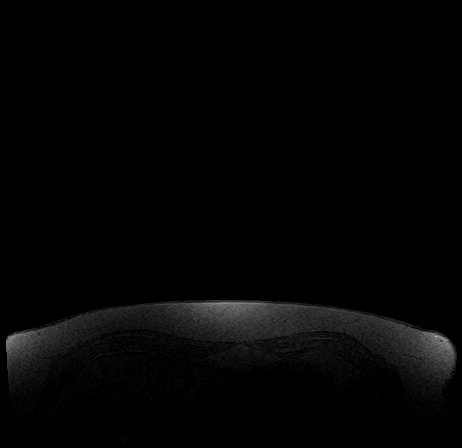
[im 32/160]
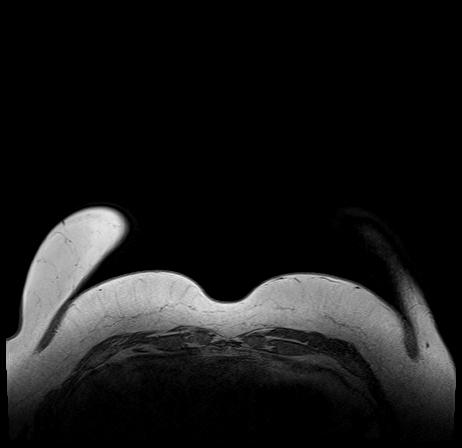
[im 64/160]
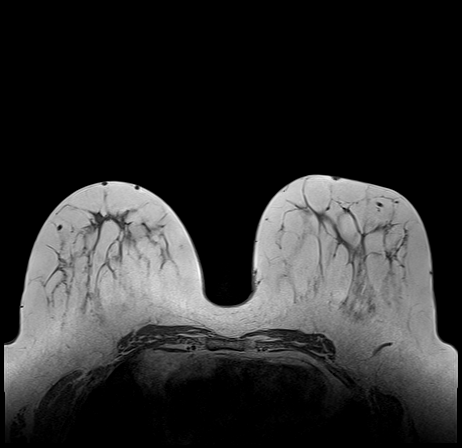
[im 96/160]
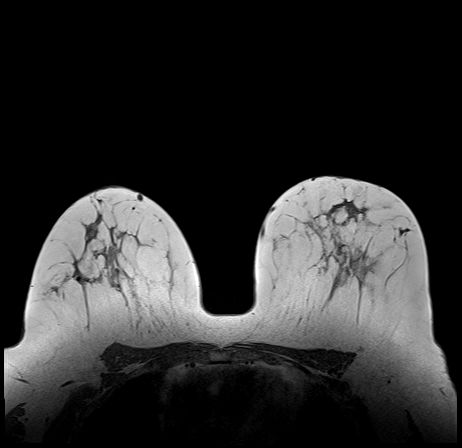
[im 128/160]
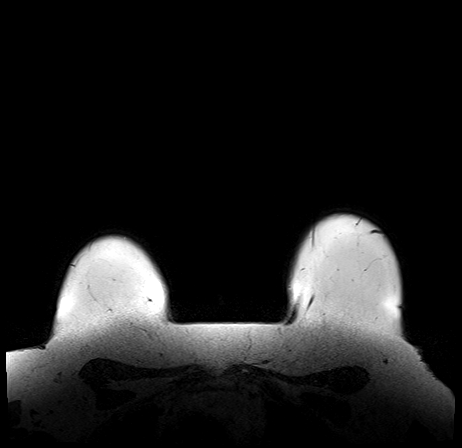
[im 160/160]
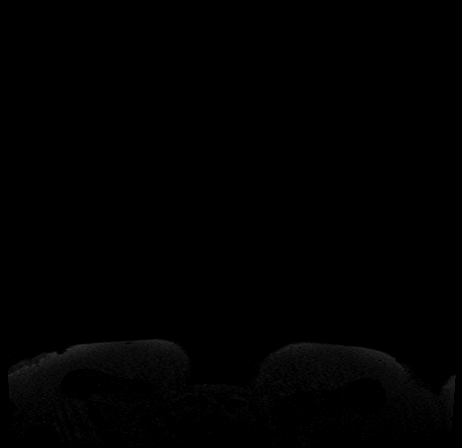

[Series 4: T1 fat-sat · axial · 1.6mm · 0.82mm/px · z∈[-95,+95]mm · 5 of 120 slices shown (2 of 4)]
[im 1/120]
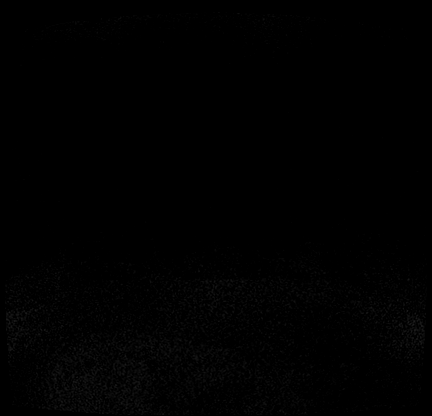
[im 30/120]
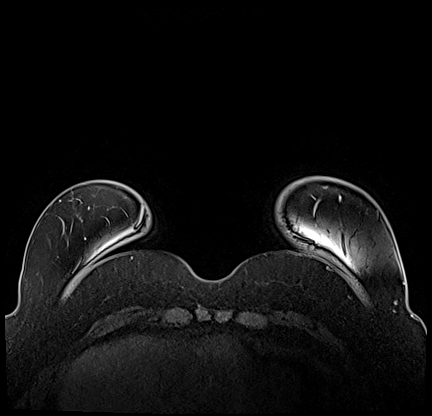
[im 60/120]
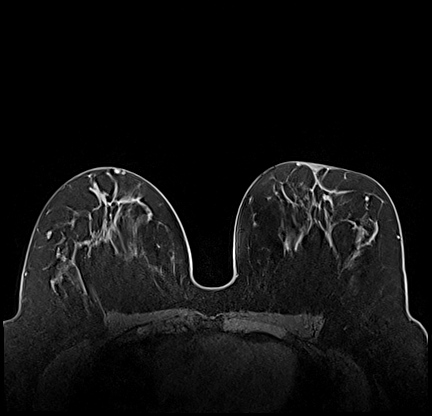
[im 90/120]
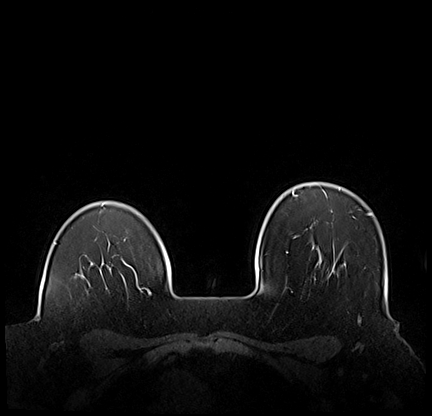
[im 120/120]
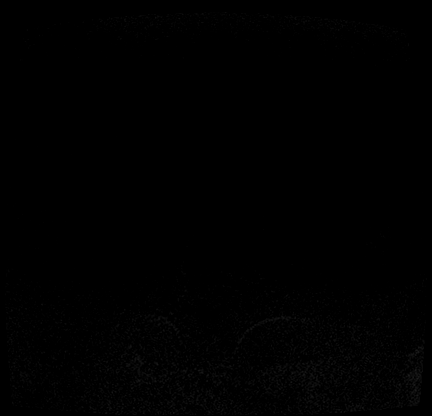

[Series 5: T1 fat-sat · axial · 1.6mm · 0.82mm/px · z∈[-95,+95]mm · 5 of 120 slices shown (3 of 4)]
[im 1/120]
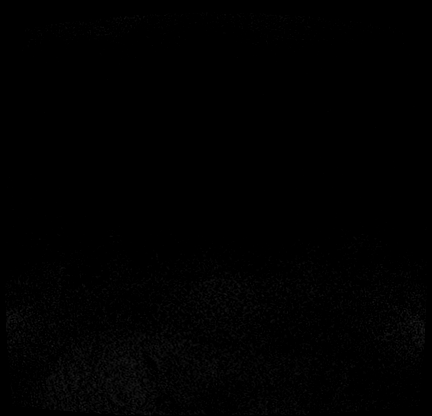
[im 30/120]
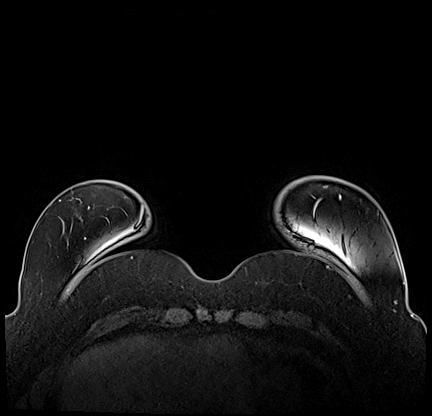
[im 60/120]
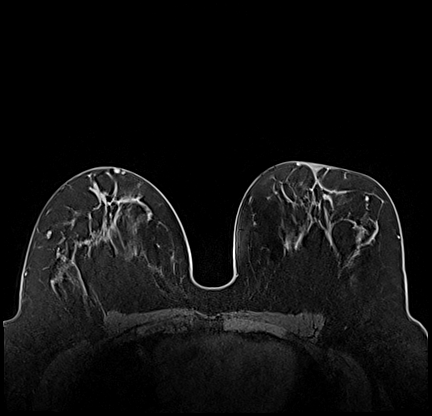
[im 90/120]
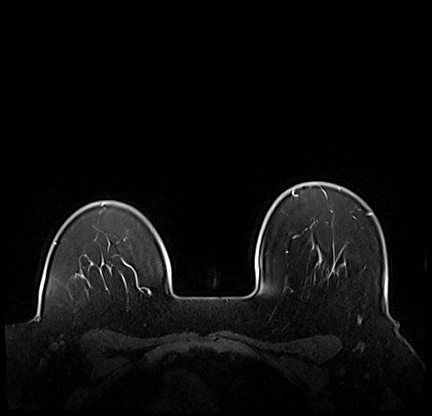
[im 120/120]
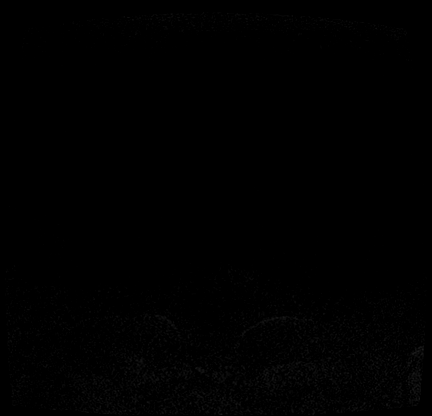

[Series 6: T1 fat-sat · axial · 1.6mm · 0.82mm/px · z∈[-95,+95]mm · 5 of 120 slices shown (4 of 4)]
[im 1/120]
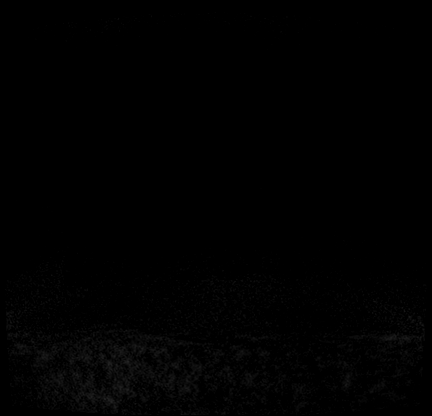
[im 30/120]
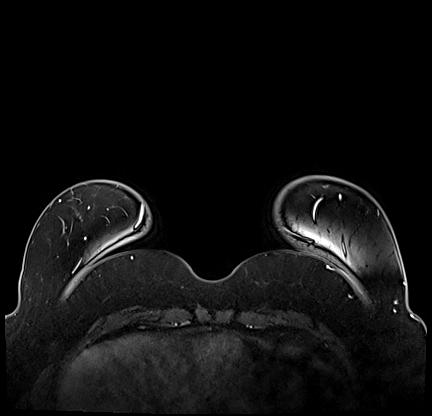
[im 60/120]
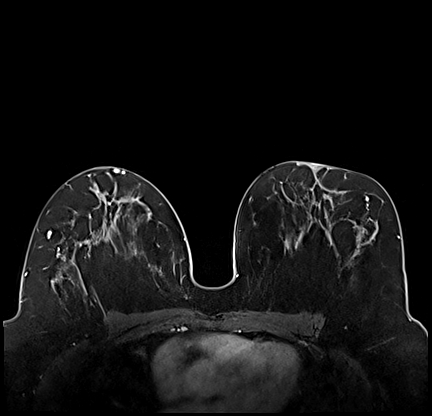
[im 90/120]
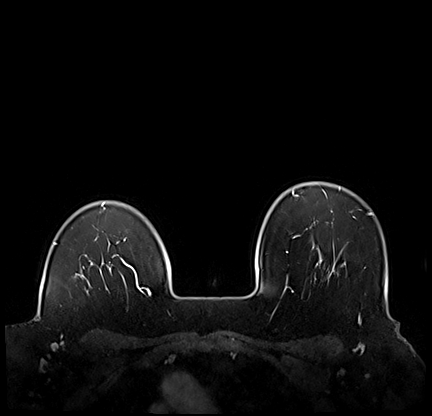
[im 120/120]
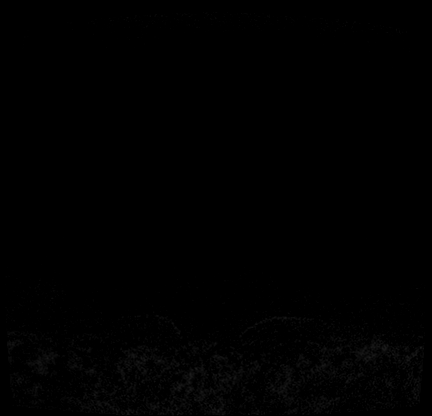

[Series 7: T1 · axial · 1.6mm · 0.82mm/px · z∈[-95,+47]mm · 4 of 120 slices shown]
[im 1/120]
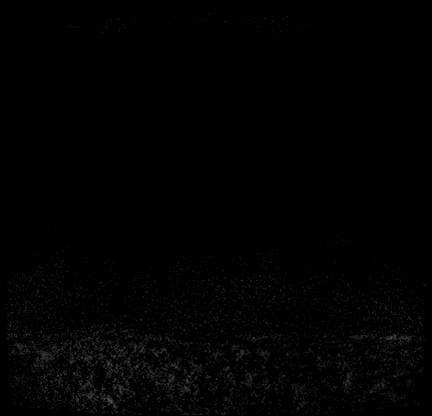
[im 30/120]
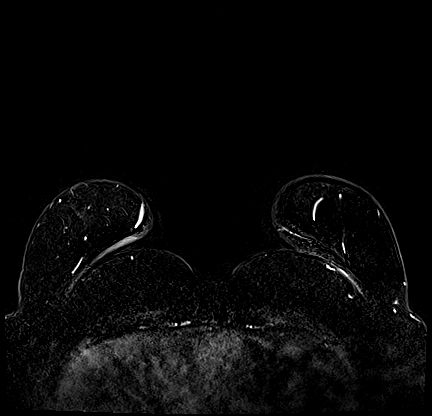
[im 60/120]
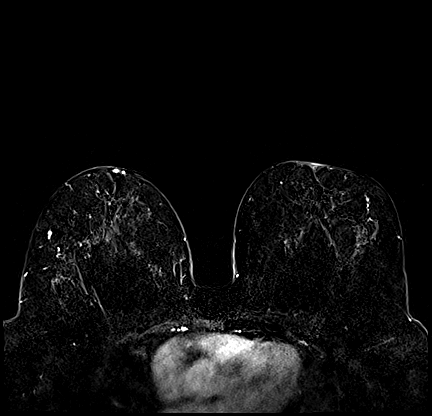
[im 90/120]
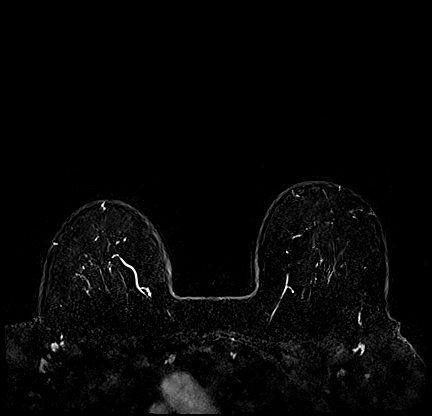

[27 of 48 positions shown; findings below may reference images not displayed]

Three-dimensional MR images were rendered by post-processing of the
original MR data on an independent workstation. The
three-dimensional MR images were interpreted, and findings are
reported in the following complete MRI report for this study. Three
dimensional images were evaluated at the independent interpreting
workstation using the DynaCAD thin client.
FINDINGS: Breast composition: b. Scattered fibroglandular tissue.

Background parenchymal enhancement: Mild

Right breast: A 1 x 0.7 x 0.7 cm irregular mass within the
UPPER-OUTER RIGHT breast middle depth (series 13: Image 48) contains
biopsy clip artifact and compatible with biopsy-proven malignancy.

No other mass or worrisome enhancement is identified within the
RIGHT breast.

Left breast: No mass or abnormal enhancement.

Lymph nodes: No abnormal appearing lymph nodes.

Ancillary findings:  None.
IMPRESSION: 1. 1 cm biopsy-proven UPPER OUTER RIGHT breast malignancy. No other
evidence of breast malignancy or abnormal appearing lymph nodes.

RECOMMENDATION:
Treatment plan

BI-RADS CATEGORY  6: Known biopsy-proven malignancy.

## 2020-09-01 MED ORDER — GADOBUTROL 1 MMOL/ML IV SOLN
9.0000 mL | Freq: Once | INTRAVENOUS | Status: AC | PRN
  Administered 2020-09-01: 9 mL via INTRAVENOUS

## 2020-09-01 NOTE — Progress Notes (Signed)
[facility] CSW Progress Note  Holiday representative met with patient and patient's mom (present at end of visit) to provide brief counseling. Patient expressed fears around mortality as well as around physical changes and how it might impact sexuality & relationships. CSW provided empathic listening, normalized feelings, and discussed ways to cope. Focused on how to allow yourself to feel the various emotions and then be able to refocus on family, friends, work, and self. Practiced deep breathing.  Support people: mom, two sisters, daughter Coping: prayer, Darrick Meigs music, focusing on grandson (69 yo)  Resources discussed: Engineer, maintenance (IT), Look Good Feel Better, support group, [employer]  Next visit: 10/14 joint with Dr. Lindi Adie (post-surgery on 10/6)   Edwinna Areola Taylin Leder LCSW

## 2020-09-03 ENCOUNTER — Other Ambulatory Visit: Payer: Self-pay

## 2020-09-03 ENCOUNTER — Encounter (HOSPITAL_BASED_OUTPATIENT_CLINIC_OR_DEPARTMENT_OTHER): Payer: Self-pay | Admitting: Surgery

## 2020-09-05 ENCOUNTER — Encounter: Payer: Self-pay | Admitting: *Deleted

## 2020-09-06 ENCOUNTER — Other Ambulatory Visit (HOSPITAL_COMMUNITY): Payer: PRIVATE HEALTH INSURANCE

## 2020-09-08 ENCOUNTER — Encounter (HOSPITAL_BASED_OUTPATIENT_CLINIC_OR_DEPARTMENT_OTHER)
Admission: RE | Admit: 2020-09-08 | Discharge: 2020-09-08 | Disposition: A | Discharge status: Home / Self Care | Payer: Medicaid Other | Source: Ambulatory Visit | Attending: Surgery | Admitting: Surgery

## 2020-09-08 ENCOUNTER — Other Ambulatory Visit (HOSPITAL_COMMUNITY)
Admission: RE | Admit: 2020-09-08 | Discharge: 2020-09-08 | Disposition: A | Discharge status: Home / Self Care | Payer: Medicaid Other | Source: Ambulatory Visit | Attending: Surgery | Admitting: Surgery

## 2020-09-08 DIAGNOSIS — Z20822 Contact with and (suspected) exposure to covid-19: Secondary | ICD-10-CM | POA: Insufficient documentation

## 2020-09-08 DIAGNOSIS — Z01818 Encounter for other preprocedural examination: Secondary | ICD-10-CM | POA: Insufficient documentation

## 2020-09-08 DIAGNOSIS — Z01812 Encounter for preprocedural laboratory examination: Secondary | ICD-10-CM | POA: Insufficient documentation

## 2020-09-08 LAB — CBC WITH DIFFERENTIAL/PLATELET
Abs Immature Granulocytes: 0.02 10*3/uL (ref 0.00–0.07)
Basophils Absolute: 0.1 10*3/uL (ref 0.0–0.1)
Basophils Relative: 1 %
Eosinophils Absolute: 0.2 10*3/uL (ref 0.0–0.5)
Eosinophils Relative: 2 %
HCT: 39.2 % (ref 36.0–46.0)
Hemoglobin: 13.2 g/dL (ref 12.0–15.0)
Immature Granulocytes: 0 %
Lymphocytes Relative: 40 %
Lymphs Abs: 3.1 10*3/uL (ref 0.7–4.0)
MCH: 27.1 pg (ref 26.0–34.0)
MCHC: 33.7 g/dL (ref 30.0–36.0)
MCV: 80.5 fL (ref 80.0–100.0)
Monocytes Absolute: 0.5 10*3/uL (ref 0.1–1.0)
Monocytes Relative: 7 %
Neutro Abs: 3.9 10*3/uL (ref 1.7–7.7)
Neutrophils Relative %: 50 %
Platelets: 268 10*3/uL (ref 150–400)
RBC: 4.87 MIL/uL (ref 3.87–5.11)
RDW: 13.2 % (ref 11.5–15.5)
WBC: 7.7 10*3/uL (ref 4.0–10.5)
nRBC: 0 % (ref 0.0–0.2)

## 2020-09-08 LAB — COMPREHENSIVE METABOLIC PANEL
ALT: 13 U/L (ref 0–44)
AST: 20 U/L (ref 15–41)
Albumin: 3.6 g/dL (ref 3.5–5.0)
Alkaline Phosphatase: 90 U/L (ref 38–126)
Anion gap: 6 (ref 5–15)
BUN: 8 mg/dL (ref 6–20)
CO2: 27 mmol/L (ref 22–32)
Calcium: 9.2 mg/dL (ref 8.9–10.3)
Chloride: 104 mmol/L (ref 98–111)
Creatinine, Ser: 0.78 mg/dL (ref 0.44–1.00)
GFR calc Af Amer: >60 mL/min (ref >60)
GFR calc non Af Amer: >60 mL/min (ref >60)
Glucose, Bld: 82 mg/dL (ref 70–99)
Potassium: 4.7 mmol/L (ref 3.5–5.1)
Sodium: 137 mmol/L (ref 135–145)
Total Bilirubin: 0.4 mg/dL (ref 0.3–1.2)
Total Protein: 6.9 g/dL (ref 6.5–8.1)

## 2020-09-08 LAB — SARS CORONAVIRUS 2 (TAT 6-24 HRS): SARS Coronavirus 2: NEGATIVE

## 2020-09-08 MED ORDER — CHLORHEXIDINE GLUCONATE CLOTH 2 % EX PADS: 6.0000 | MEDICATED_PAD | Freq: Once | CUTANEOUS | Status: DC

## 2020-09-08 NOTE — Progress Notes (Signed)

## 2020-09-10 ENCOUNTER — Ambulatory Visit (HOSPITAL_BASED_OUTPATIENT_CLINIC_OR_DEPARTMENT_OTHER)
Admission: RE | Admit: 2020-09-10 | Discharge: 2020-09-10 | Disposition: A | Discharge status: Home / Self Care | Payer: Medicaid Other | Attending: Surgery | Admitting: Surgery

## 2020-09-10 ENCOUNTER — Ambulatory Visit (HOSPITAL_BASED_OUTPATIENT_CLINIC_OR_DEPARTMENT_OTHER): Payer: Medicaid Other | Admitting: Anesthesiology

## 2020-09-10 ENCOUNTER — Other Ambulatory Visit: Payer: Self-pay

## 2020-09-10 ENCOUNTER — Encounter (HOSPITAL_BASED_OUTPATIENT_CLINIC_OR_DEPARTMENT_OTHER): Payer: Self-pay | Admitting: Surgery

## 2020-09-10 ENCOUNTER — Ambulatory Visit
Admission: RE | Admit: 2020-09-10 | Discharge: 2020-09-10 | Disposition: A | Discharge status: Home / Self Care | Payer: PRIVATE HEALTH INSURANCE | Source: Ambulatory Visit | Attending: Surgery | Admitting: Surgery

## 2020-09-10 ENCOUNTER — Ambulatory Visit
Admission: RE | Admit: 2020-09-10 | Discharge: 2020-09-10 | Disposition: A | Discharge status: Home / Self Care | Payer: Medicaid Other | Source: Ambulatory Visit | Attending: Surgery | Admitting: Surgery

## 2020-09-10 ENCOUNTER — Ambulatory Visit (HOSPITAL_COMMUNITY)
Admission: RE | Admit: 2020-09-10 | Discharge: 2020-09-10 | Disposition: A | Discharge status: Home / Self Care | Payer: Medicaid Other | Source: Ambulatory Visit | Attending: Surgery | Admitting: Surgery

## 2020-09-10 ENCOUNTER — Encounter (HOSPITAL_BASED_OUTPATIENT_CLINIC_OR_DEPARTMENT_OTHER)
Admission: RE | Disposition: A | Discharge status: Home / Self Care | Payer: Self-pay | Source: Home / Self Care | Attending: Surgery

## 2020-09-10 DIAGNOSIS — Z803 Family history of malignant neoplasm of breast: Secondary | ICD-10-CM | POA: Insufficient documentation

## 2020-09-10 DIAGNOSIS — Z7982 Long term (current) use of aspirin: Secondary | ICD-10-CM | POA: Diagnosis not present

## 2020-09-10 DIAGNOSIS — C50911 Malignant neoplasm of unspecified site of right female breast: Secondary | ICD-10-CM

## 2020-09-10 DIAGNOSIS — C50411 Malignant neoplasm of upper-outer quadrant of right female breast: Secondary | ICD-10-CM | POA: Diagnosis not present

## 2020-09-10 DIAGNOSIS — Z6833 Body mass index (BMI) 33.0-33.9, adult: Secondary | ICD-10-CM | POA: Insufficient documentation

## 2020-09-10 DIAGNOSIS — C773 Secondary and unspecified malignant neoplasm of axilla and upper limb lymph nodes: Secondary | ICD-10-CM | POA: Diagnosis not present

## 2020-09-10 DIAGNOSIS — Z79899 Other long term (current) drug therapy: Secondary | ICD-10-CM | POA: Insufficient documentation

## 2020-09-10 DIAGNOSIS — E669 Obesity, unspecified: Secondary | ICD-10-CM | POA: Insufficient documentation

## 2020-09-10 DIAGNOSIS — I10 Essential (primary) hypertension: Secondary | ICD-10-CM | POA: Insufficient documentation

## 2020-09-10 HISTORY — PX: BREAST LUMPECTOMY WITH RADIOACTIVE SEED AND SENTINEL LYMPH NODE BIOPSY: SHX6550

## 2020-09-10 HISTORY — DX: Unspecified asthma, uncomplicated: J45.909

## 2020-09-10 IMAGING — MG MM BREAST SURGICAL SPECIMEN
1 series · 2 of 2 positions shown · non-contrast
Comparison: Previous exam(s).

CLINICAL DATA: Status post excisional biopsy of the right breast.

EXAM:
SPECIMEN RADIOGRAPH OF THE RIGHT BREAST

[Series 1: R · right · 0.07mm/px · 2 of 2 slices shown]
[im 1/2]
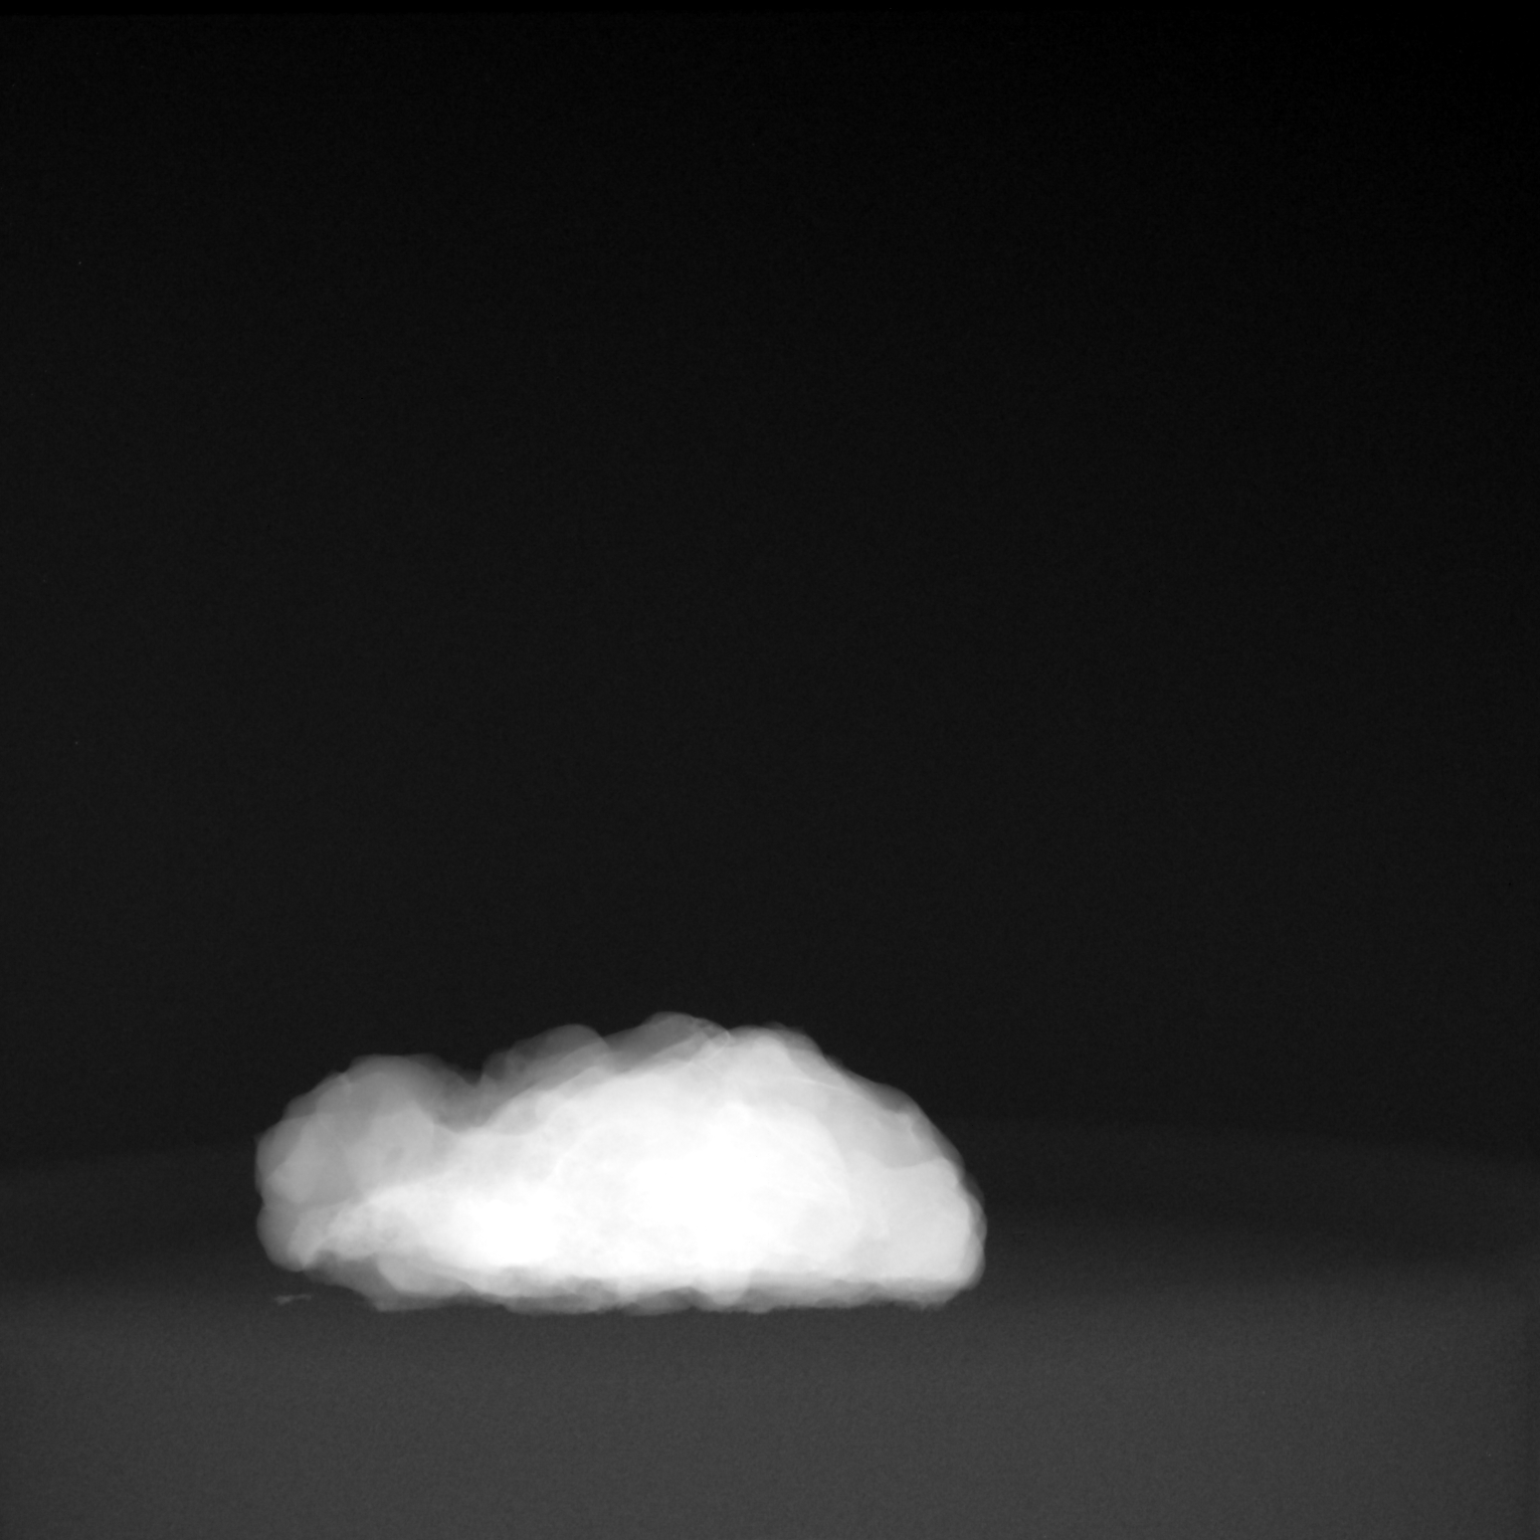
[im 2/2]
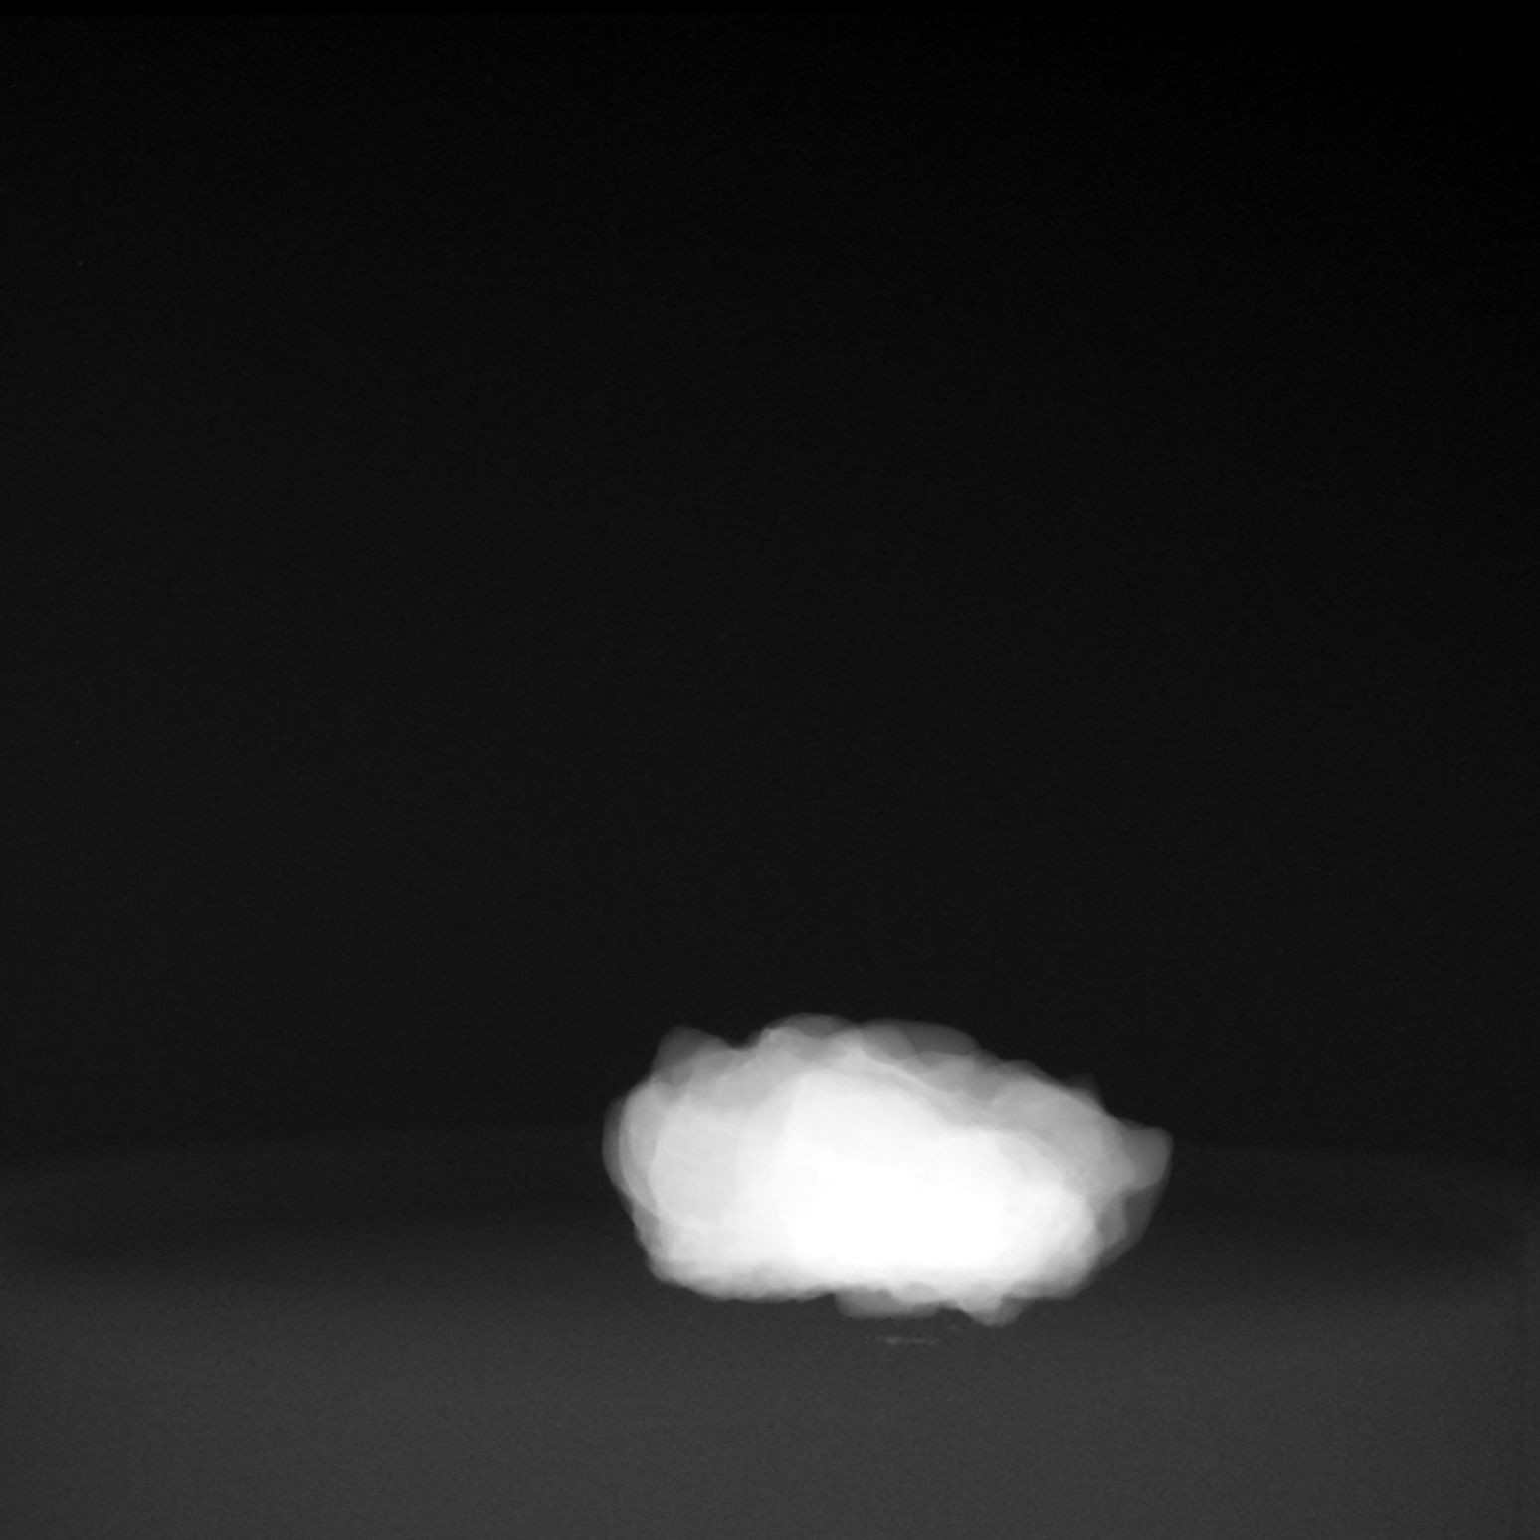

[2 of 2 positions shown; findings below may reference images not displayed]

FINDINGS: Status post excision of the right breast. The radioactive seed and
biopsy marker clip are present, completely intact, and were marked
for pathology.
IMPRESSION: Specimen radiograph of the right breast.

## 2020-09-10 IMAGING — MG MM PLC BREAST LOC DEV 1ST LESION INC*R*
8 series · 8 of 8 positions shown · non-contrast
Comparison: Previous exam(s).

CLINICAL DATA: 49-year-old female for radioactive seed localization
of RIGHT breast cancer prior to lumpectomy.

EXAM:
MAMMOGRAPHIC GUIDED RADIOACTIVE SEED LOCALIZATION OF THE RIGHT
BREAST

[R CC (1 of 4)]
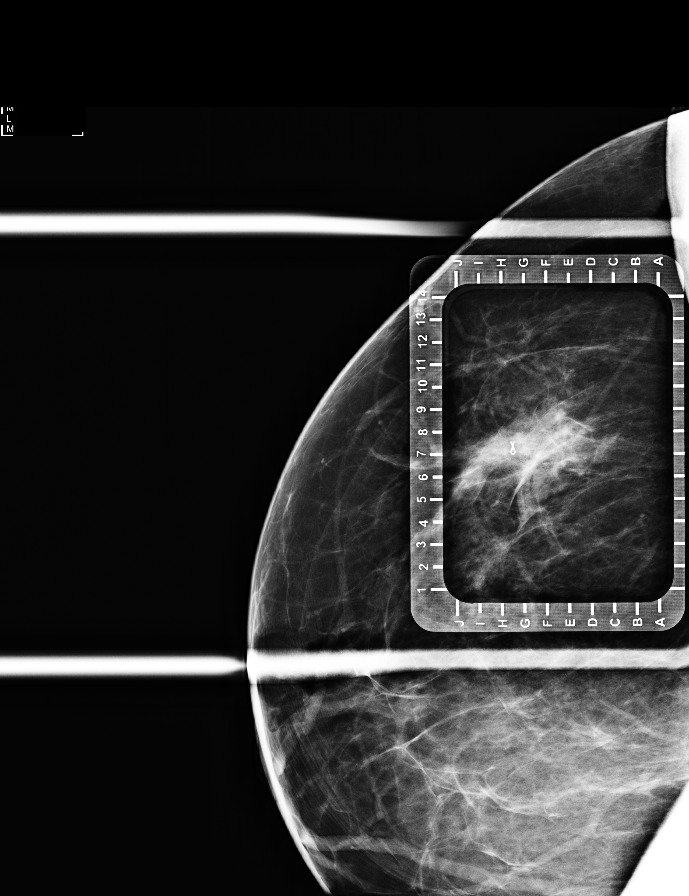

[R CC (2 of 4)]
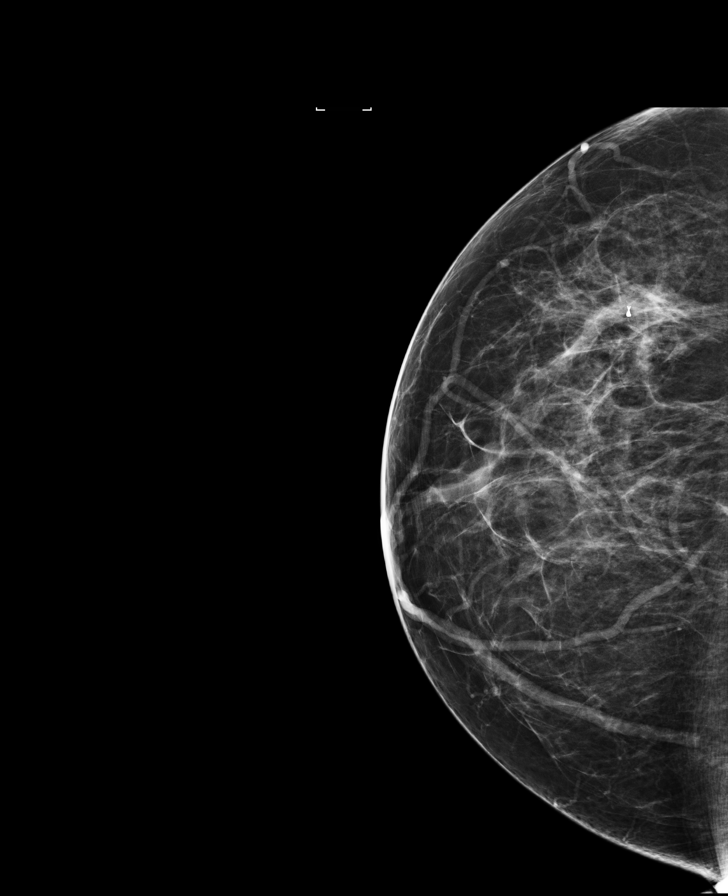

[R ML (1 of 4)]
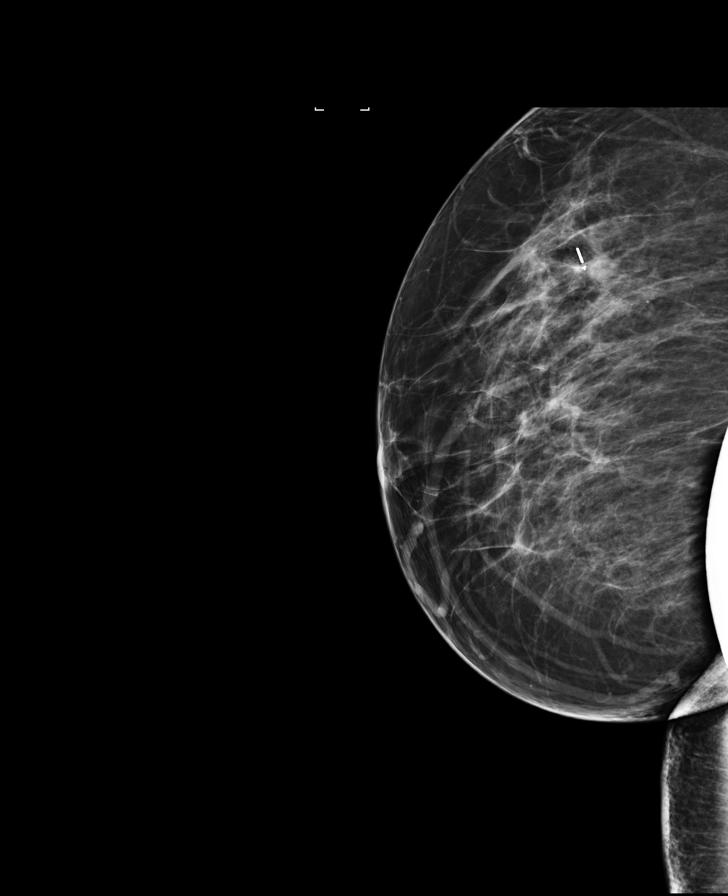

[R ML (2 of 4)]
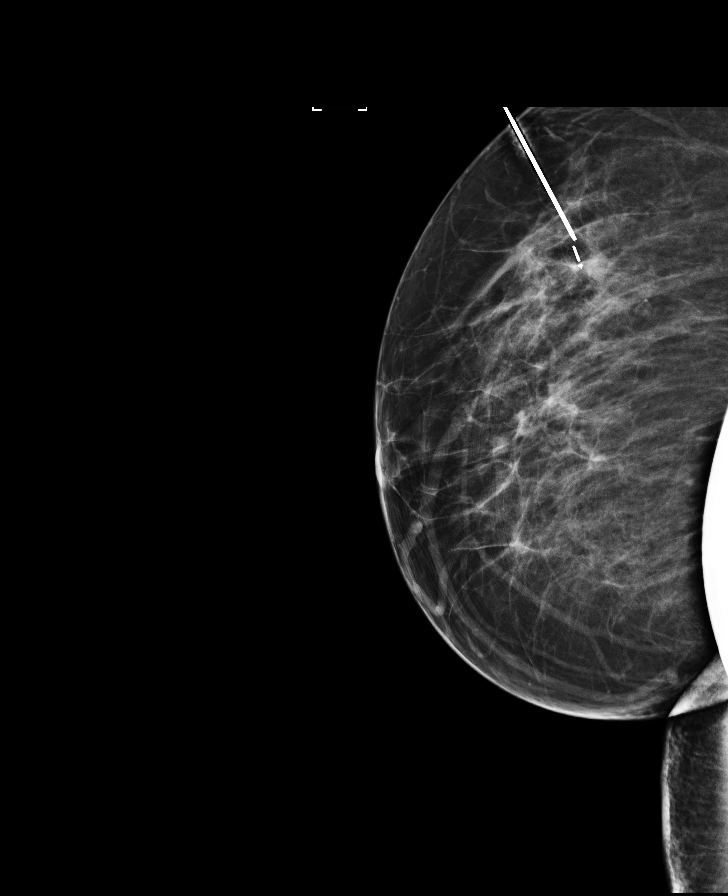

[R CC (3 of 4)]
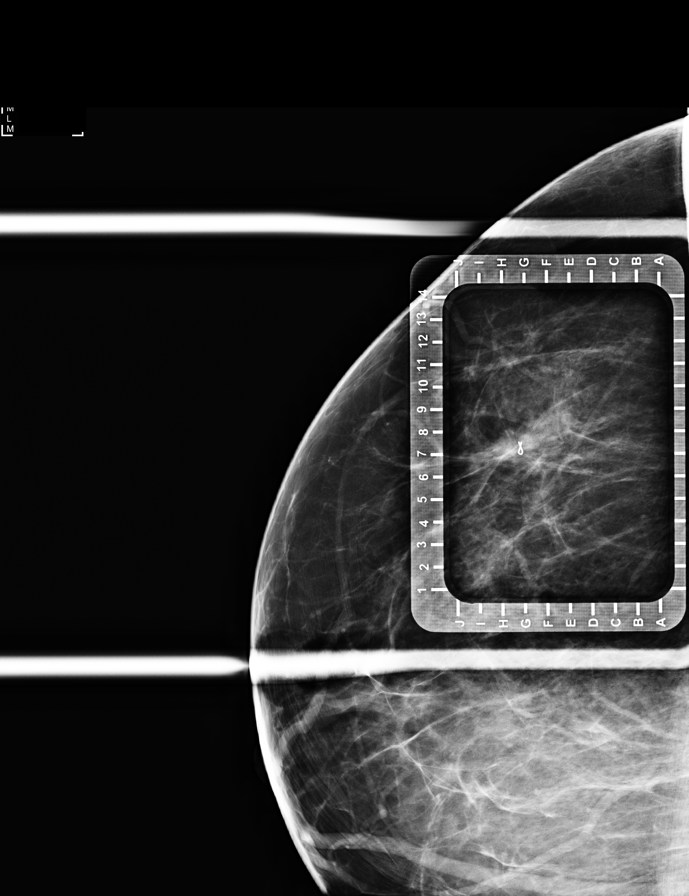

[R CC (4 of 4)]
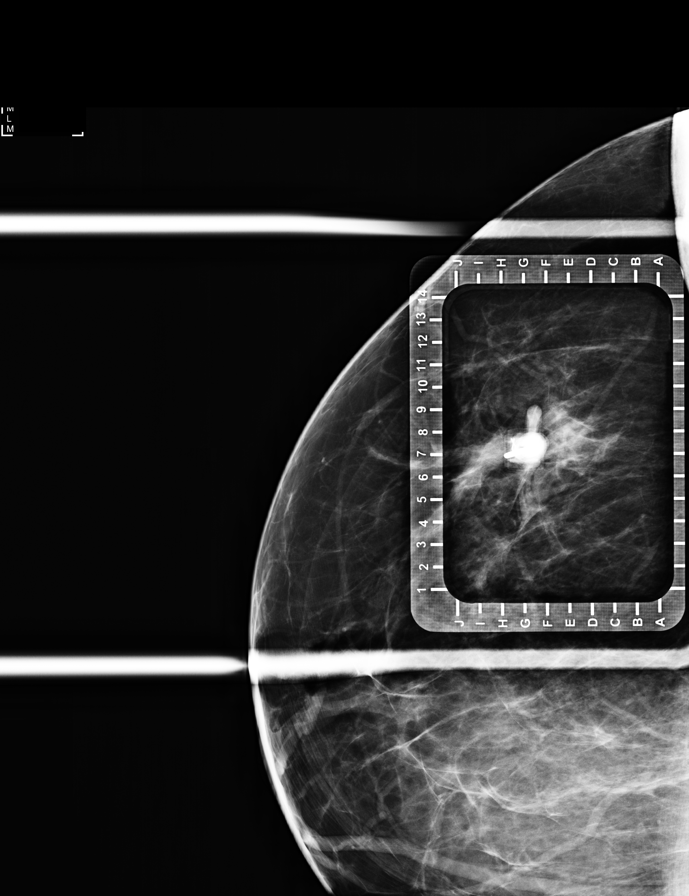

[R ML (3 of 4)]
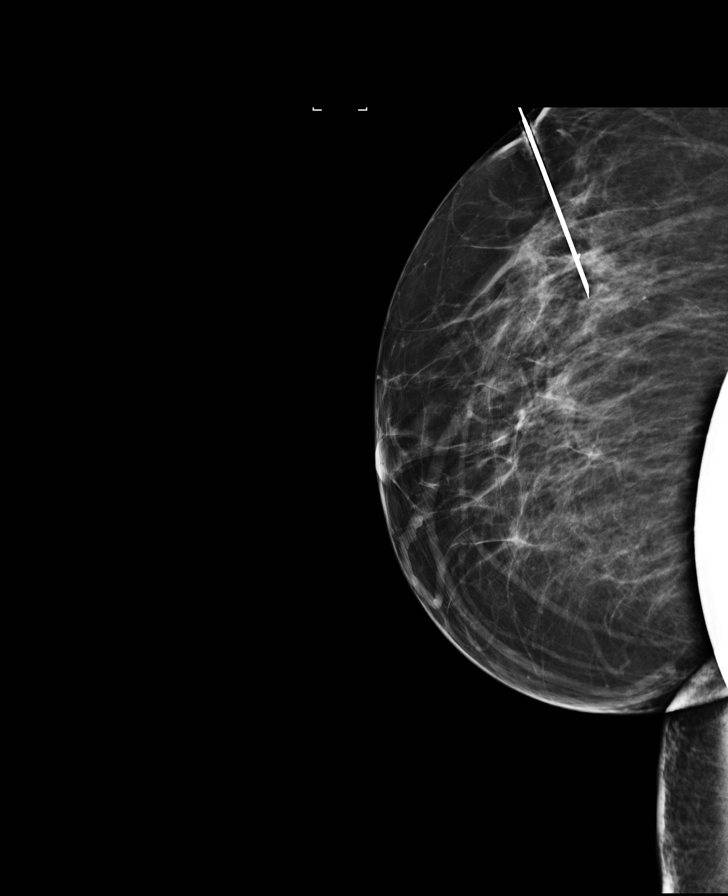

[R ML (4 of 4)]
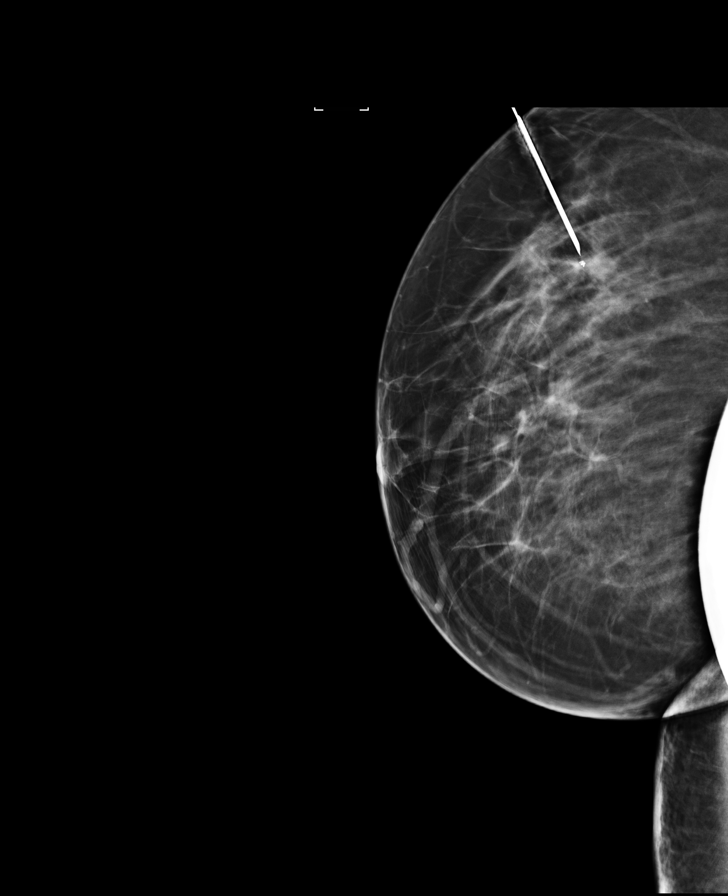

[8 of 8 positions shown; findings below may reference images not displayed]

FINDINGS: Patient presents for radioactive seed localization prior to RIGHT
lumpectomy. I met with the patient and we discussed the procedure of
seed localization including benefits and alternatives. We discussed
the high likelihood of a successful procedure. We discussed the
risks of the procedure including infection, bleeding, tissue injury
and further surgery. We discussed the low dose of radioactivity
involved in the procedure. Informed, written consent was given.

The usual time-out protocol was performed immediately prior to the
procedure.

Using mammographic guidance, sterile technique, 1% lidocaine and an
[P3] radioactive seed, the RIBBON biopsy clip was localized using a
SUPERIOR approach. The follow-up mammogram images confirm the seed
in the expected location and were marked for Dr. IBRAHIM ADOUR.

Follow-up survey of the patient confirms presence of the radioactive
seed.

Order number of [P3] seed:  [PHONE_NUMBER].

Total activity:  0.233 millicuries.  Reference Date: [DATE].

The patient tolerated the procedure well and was released from the
[REDACTED]. She was given instructions regarding seed removal.
IMPRESSION: Radioactive seed localization RIGHT breast. No apparent
complications.

## 2020-09-10 SURGERY — BREAST LUMPECTOMY WITH RADIOACTIVE SEED AND SENTINEL LYMPH NODE BIOPSY
Anesthesia: General | Site: Breast | Laterality: Right

## 2020-09-10 MED ORDER — BUPIVACAINE HCL (PF) 0.25 % IJ SOLN
INTRAMUSCULAR | Status: DC | PRN
  Administered 2020-09-10: 10 mL

## 2020-09-10 MED ORDER — LIDOCAINE 2% (20 MG/ML) 5 ML SYRINGE
INTRAMUSCULAR | Status: DC | PRN
  Administered 2020-09-10: 40 mg via INTRAVENOUS

## 2020-09-10 MED ORDER — MIDAZOLAM HCL 2 MG/2ML IJ SOLN
INTRAMUSCULAR | Status: AC
  Filled 2020-09-10: qty 2

## 2020-09-10 MED ORDER — GABAPENTIN 300 MG PO CAPS
300.0000 mg | ORAL_CAPSULE | ORAL | Status: AC
  Administered 2020-09-10: 300 mg via ORAL

## 2020-09-10 MED ORDER — ONDANSETRON HCL 4 MG/2ML IJ SOLN
INTRAMUSCULAR | Status: DC | PRN
  Administered 2020-09-10: 4 mg via INTRAVENOUS

## 2020-09-10 MED ORDER — CEFAZOLIN SODIUM-DEXTROSE 2-4 GM/100ML-% IV SOLN
INTRAVENOUS | Status: AC
  Filled 2020-09-10: qty 100

## 2020-09-10 MED ORDER — FENTANYL CITRATE (PF) 100 MCG/2ML IJ SOLN
INTRAMUSCULAR | Status: AC
  Filled 2020-09-10: qty 2

## 2020-09-10 MED ORDER — FENTANYL CITRATE (PF) 100 MCG/2ML IJ SOLN
100.0000 ug | Freq: Once | INTRAMUSCULAR | Status: AC
  Administered 2020-09-10: 100 ug via INTRAVENOUS

## 2020-09-10 MED ORDER — METHYLENE BLUE 0.5 % INJ SOLN
INTRAVENOUS | Status: AC
  Filled 2020-09-10: qty 10

## 2020-09-10 MED ORDER — OXYCODONE HCL 5 MG PO TABS: 5.0000 mg | ORAL_TABLET | Freq: Once | ORAL | Status: DC | PRN

## 2020-09-10 MED ORDER — GABAPENTIN 300 MG PO CAPS
ORAL_CAPSULE | ORAL | Status: AC
  Filled 2020-09-10: qty 1

## 2020-09-10 MED ORDER — ONDANSETRON HCL 4 MG/2ML IJ SOLN
INTRAMUSCULAR | Status: AC
  Filled 2020-09-10: qty 2

## 2020-09-10 MED ORDER — DEXAMETHASONE SODIUM PHOSPHATE 10 MG/ML IJ SOLN
INTRAMUSCULAR | Status: AC
  Filled 2020-09-10: qty 1

## 2020-09-10 MED ORDER — HYDROCODONE-ACETAMINOPHEN 5-325 MG PO TABS: 1.0000 | ORAL_TABLET | Freq: Four times a day (QID) | ORAL | 0 refills | Status: DC | PRN

## 2020-09-10 MED ORDER — FENTANYL CITRATE (PF) 100 MCG/2ML IJ SOLN
INTRAMUSCULAR | Status: DC | PRN
Start: 2020-09-10 — End: 2020-09-10
  Administered 2020-09-10: 25 ug via INTRAVENOUS

## 2020-09-10 MED ORDER — BUPIVACAINE HCL (PF) 0.5 % IJ SOLN
INTRAMUSCULAR | Status: DC | PRN
  Administered 2020-09-10: 15 mL

## 2020-09-10 MED ORDER — PROMETHAZINE HCL 25 MG/ML IJ SOLN: 6.2500 mg | INTRAMUSCULAR | Status: DC | PRN

## 2020-09-10 MED ORDER — LACTATED RINGERS IV SOLN: INTRAVENOUS | Status: DC

## 2020-09-10 MED ORDER — DEXTROSE 5 % IV SOLN
3.0000 g | INTRAVENOUS | Status: AC
  Administered 2020-09-10: 2 g via INTRAVENOUS
  Filled 2020-09-10: qty 3000

## 2020-09-10 MED ORDER — EPHEDRINE SULFATE 50 MG/ML IJ SOLN
INTRAMUSCULAR | Status: DC | PRN
  Administered 2020-09-10 (×2): 10 mg via INTRAVENOUS

## 2020-09-10 MED ORDER — PROPOFOL 10 MG/ML IV BOLUS
INTRAVENOUS | Status: AC
  Filled 2020-09-10: qty 20

## 2020-09-10 MED ORDER — IBUPROFEN 800 MG PO TABS: 800.0000 mg | ORAL_TABLET | Freq: Three times a day (TID) | ORAL | 0 refills | Status: DC | PRN

## 2020-09-10 MED ORDER — 0.9 % SODIUM CHLORIDE (POUR BTL) OPTIME
TOPICAL | Status: DC | PRN
  Administered 2020-09-10: 500 mL

## 2020-09-10 MED ORDER — PROPOFOL 10 MG/ML IV BOLUS
INTRAVENOUS | Status: DC | PRN
  Administered 2020-09-10: 200 mg via INTRAVENOUS

## 2020-09-10 MED ORDER — DEXAMETHASONE SODIUM PHOSPHATE 4 MG/ML IJ SOLN
INTRAMUSCULAR | Status: DC | PRN
  Administered 2020-09-10: 4 mg via INTRAVENOUS

## 2020-09-10 MED ORDER — SODIUM CHLORIDE (PF) 0.9 % IJ SOLN
INTRAMUSCULAR | Status: AC
  Filled 2020-09-10: qty 10

## 2020-09-10 MED ORDER — BUPIVACAINE LIPOSOME 1.3 % IJ SUSP
INTRAMUSCULAR | Status: DC | PRN
  Administered 2020-09-10: 10 mL

## 2020-09-10 MED ORDER — FENTANYL CITRATE (PF) 100 MCG/2ML IJ SOLN
25.0000 ug | INTRAMUSCULAR | Status: DC | PRN
  Administered 2020-09-10: 50 ug via INTRAVENOUS

## 2020-09-10 MED ORDER — LIDOCAINE 2% (20 MG/ML) 5 ML SYRINGE
INTRAMUSCULAR | Status: AC
  Filled 2020-09-10: qty 5

## 2020-09-10 MED ORDER — OXYCODONE HCL 5 MG/5ML PO SOLN: 5.0000 mg | Freq: Once | ORAL | Status: DC | PRN

## 2020-09-10 MED ORDER — TECHNETIUM TC 99M SULFUR COLLOID FILTERED
1.0000 | Freq: Once | INTRAVENOUS | Status: AC | PRN
  Administered 2020-09-10: 1 via INTRADERMAL

## 2020-09-10 MED ORDER — MIDAZOLAM HCL 2 MG/2ML IJ SOLN
2.0000 mg | Freq: Once | INTRAMUSCULAR | Status: AC
  Administered 2020-09-10: 2 mg via INTRAVENOUS

## 2020-09-10 SURGICAL SUPPLY — 59 items
ADH SKN CLS APL DERMABOND .7 (GAUZE/BANDAGES/DRESSINGS) ×1
APL PRP STRL LF DISP 70% ISPRP (MISCELLANEOUS) ×1
APPLIER CLIP 9.375 MED OPEN (MISCELLANEOUS) ×3
APR CLP MED 9.3 20 MLT OPN (MISCELLANEOUS) ×1
BINDER BREAST LRG (GAUZE/BANDAGES/DRESSINGS) IMPLANT
BINDER BREAST MEDIUM (GAUZE/BANDAGES/DRESSINGS) IMPLANT
BINDER BREAST XLRG (GAUZE/BANDAGES/DRESSINGS) ×3 IMPLANT
BINDER BREAST XXLRG (GAUZE/BANDAGES/DRESSINGS) IMPLANT
BLADE SURG 15 STRL LF DISP TIS (BLADE) ×2 IMPLANT
BLADE SURG 15 STRL SS (BLADE) ×6
CANISTER SUC SOCK COL 7IN (MISCELLANEOUS) IMPLANT
CANISTER SUCT 1200ML W/VALVE (MISCELLANEOUS) ×3 IMPLANT
CHLORAPREP W/TINT 26 (MISCELLANEOUS) ×3 IMPLANT
CLIP APPLIE 9.375 MED OPEN (MISCELLANEOUS) ×1 IMPLANT
COVER BACK TABLE 60X90IN (DRAPES) ×3 IMPLANT
COVER MAYO STAND STRL (DRAPES) ×3 IMPLANT
COVER PROBE W GEL 5X96 (DRAPES) ×3 IMPLANT
COVER WAND RF STERILE (DRAPES) IMPLANT
DECANTER SPIKE VIAL GLASS SM (MISCELLANEOUS) IMPLANT
DERMABOND ADVANCED (GAUZE/BANDAGES/DRESSINGS) ×2
DERMABOND ADVANCED .7 DNX12 (GAUZE/BANDAGES/DRESSINGS) ×1 IMPLANT
DRAPE LAPAROSCOPIC ABDOMINAL (DRAPES) ×3 IMPLANT
DRAPE UTILITY XL STRL (DRAPES) ×3 IMPLANT
ELECT COATED BLADE 2.86 ST (ELECTRODE) ×3 IMPLANT
ELECT REM PT RETURN 9FT ADLT (ELECTROSURGICAL) ×3
ELECTRODE REM PT RTRN 9FT ADLT (ELECTROSURGICAL) ×1 IMPLANT
GLOVE BIO SURGEON STRL SZ 6.5 (GLOVE) ×2 IMPLANT
GLOVE BIO SURGEONS STRL SZ 6.5 (GLOVE) ×1
GLOVE BIOGEL PI IND STRL 6.5 (GLOVE) ×1 IMPLANT
GLOVE BIOGEL PI IND STRL 7.0 (GLOVE) ×2 IMPLANT
GLOVE BIOGEL PI IND STRL 8 (GLOVE) ×1 IMPLANT
GLOVE BIOGEL PI INDICATOR 6.5 (GLOVE) ×2
GLOVE BIOGEL PI INDICATOR 7.0 (GLOVE) ×4
GLOVE BIOGEL PI INDICATOR 8 (GLOVE) ×2
GLOVE ECLIPSE 6.5 STRL STRAW (GLOVE) ×3 IMPLANT
GLOVE ECLIPSE 8.0 STRL XLNG CF (GLOVE) ×3 IMPLANT
GOWN STRL REUS W/ TWL LRG LVL3 (GOWN DISPOSABLE) ×2 IMPLANT
GOWN STRL REUS W/ TWL XL LVL3 (GOWN DISPOSABLE) ×1 IMPLANT
GOWN STRL REUS W/TWL LRG LVL3 (GOWN DISPOSABLE) ×6
GOWN STRL REUS W/TWL XL LVL3 (GOWN DISPOSABLE) ×3
HEMOSTAT ARISTA ABSORB 3G PWDR (HEMOSTASIS) IMPLANT
HEMOSTAT SNOW SURGICEL 2X4 (HEMOSTASIS) IMPLANT
KIT MARKER MARGIN INK (KITS) ×3 IMPLANT
NDL SAFETY ECLIPSE 18X1.5 (NEEDLE) IMPLANT
NEEDLE HYPO 18GX1.5 SHARP (NEEDLE)
NEEDLE HYPO 25X1 1.5 SAFETY (NEEDLE) ×3 IMPLANT
NS IRRIG 1000ML POUR BTL (IV SOLUTION) ×3 IMPLANT
PACK BASIN DAY SURGERY FS (CUSTOM PROCEDURE TRAY) ×3 IMPLANT
PENCIL SMOKE EVACUATOR (MISCELLANEOUS) ×3 IMPLANT
SLEEVE SCD COMPRESS KNEE MED (MISCELLANEOUS) ×3 IMPLANT
SPONGE LAP 4X18 RFD (DISPOSABLE) ×6 IMPLANT
SUT MNCRL AB 4-0 PS2 18 (SUTURE) ×6 IMPLANT
SUT VICRYL 3-0 CR8 SH (SUTURE) ×3 IMPLANT
SYR CONTROL 10ML LL (SYRINGE) ×3 IMPLANT
TOWEL GREEN STERILE FF (TOWEL DISPOSABLE) ×3 IMPLANT
TRAY FAXITRON CT DISP (TRAY / TRAY PROCEDURE) ×3 IMPLANT
TUBE CONNECTING 20'X1/4 (TUBING) ×1
TUBE CONNECTING 20X1/4 (TUBING) ×2 IMPLANT
YANKAUER SUCT BULB TIP NO VENT (SUCTIONS) ×3 IMPLANT

## 2020-09-10 NOTE — Anesthesia Postprocedure Evaluation (Signed)
Anesthesia Post Note  Patient: Monica [name]  Procedure(s) Performed: RIGHT BREAST LUMPECTOMY WITH RADIOACTIVE SEED AND SENTINEL LYMPH NODE MAPPING (Right Breast)     Patient location during evaluation: PACU Anesthesia Type: General Level of consciousness: awake and alert Pain management: pain level controlled Vital Signs Assessment: post-procedure vital signs reviewed and stable Respiratory status: spontaneous breathing, nonlabored ventilation and respiratory function stable Cardiovascular status: blood pressure returned to baseline and stable Postop Assessment: no apparent nausea or vomiting Anesthetic complications: no   No complications documented.  Last Vitals:  Vitals:   09/10/20 1415 09/10/20 1416  BP: 128/89   Pulse: 87 91  Resp: 17 19  Temp:    SpO2: 95% 93%    Last Pain:  Vitals:   09/10/20 1415  TempSrc:   PainSc: [name]

## 2020-09-10 NOTE — Interval H&P Note (Signed)
History and Physical Interval Note:  09/10/2020 11:53 AM  Monica [name]  has presented today for surgery, with the diagnosis of RIGHT BREAST CANCER.  The various methods of treatment have been discussed with the patient and family. After consideration of risks, benefits and other options for treatment, the patient has consented to  Procedure(s): RIGHT BREAST LUMPECTOMY WITH RADIOACTIVE SEED AND SENTINEL LYMPH NODE MAPPING (Right) as a surgical intervention.  The patient's history has been reviewed, patient examined, no change in status, stable for surgery.  I have reviewed the patient's chart and labs.  Questions were answered to the patient's satisfaction.     Turner Daniels MD

## 2020-09-10 NOTE — Anesthesia Procedure Notes (Signed)
Procedure Name: LMA Insertion Date/Time: 09/10/2020 12:27 PM Performed by: Maryella Shivers, CRNA Pre-anesthesia Checklist: Patient identified, Emergency Drugs available, Suction available and Patient being monitored Patient Re-evaluated:Patient Re-evaluated prior to induction Oxygen Delivery Method: Circle system utilized Preoxygenation: Pre-oxygenation with 100% oxygen Induction Type: IV induction Ventilation: Mask ventilation without difficulty LMA: LMA inserted LMA Size: 4.0 Number of attempts: 1 Airway Equipment and Method: Bite block Placement Confirmation: positive ETCO2 Tube secured with: Tape Dental Injury: Teeth and Oropharynx as per pre-operative assessment

## 2020-09-10 NOTE — Anesthesia Preprocedure Evaluation (Addendum)
Anesthesia Evaluation  Patient identified by MRN, date of birth, ID band Patient awake    Reviewed: Allergy & Precautions, NPO status , Patient's Chart, lab work & pertinent test results  History of Anesthesia Complications Negative for: history of anesthetic complications  Airway Mallampati: III  TM Distance: >3 FB Neck ROM: Full    Dental  (+) Dental Advisory Given, Partial Upper   Pulmonary asthma , Current Smoker and Patient abstained from smoking.,    Pulmonary exam normal        Cardiovascular hypertension, Pt. on medications Normal cardiovascular exam     Neuro/Psych negative neurological ROS  negative psych ROS   GI/Hepatic negative GI ROS, Neg liver ROS,   Endo/Other   Obesity   Renal/GU negative Renal ROS     Musculoskeletal negative musculoskeletal ROS (+)   Abdominal   Peds  Hematology negative hematology ROS (+)   Anesthesia Other Findings Covid test negative   Reproductive/Obstetrics                            Anesthesia Physical Anesthesia Plan  ASA: II  Anesthesia Plan: General   Post-op Pain Management:  Regional for Post-op pain   Induction: Intravenous  PONV Risk Score and Plan: 3 and Treatment may vary due to age or medical condition, Ondansetron, Dexamethasone and Midazolam  Airway Management Planned: LMA  Additional Equipment: None  Intra-op Plan:   Post-operative Plan: Extubation in OR  Informed Consent: I have reviewed the patients History and Physical, chart, labs and discussed the procedure including the risks, benefits and alternatives for the proposed anesthesia with the patient or authorized representative who has indicated his/her understanding and acceptance.     Dental advisory given  Plan Discussed with: CRNA and Anesthesiologist  Anesthesia Plan Comments:        Anesthesia Quick Evaluation

## 2020-09-10 NOTE — Progress Notes (Signed)
Assisted Dr. Fransisco Beau with right, ultrasound guided, pectoralis block. Side rails up, monitors on throughout procedure. See vital signs in flow sheet. Tolerated Procedure well.

## 2020-09-10 NOTE — Progress Notes (Signed)
Time out was performed with nuclear medicine Tiffany  [phone]).  Pt tolerated the procedure well.

## 2020-09-10 NOTE — Op Note (Signed)
Preoperative diagnosis: Stage I right breast cancer upper outer quadrant  Postoperative diagnosis: Same  Procedure: Right breast seed localized lumpectomy with right axillary sentinel lymph node mapping  Surgeon: Erroll Luna, MD  Assistant: Dr. Rob Hickman MD  Drains: None  EBL: 10 cc  Specimen: Right breast tissue with seed and clip verified by Faxitron and additional superior margin sent separately as well as 3 right axillary sentinel nodes hot with 1 being enlarged but not hot  IV fluids: Per anesthesia record  Indications for procedure: The patient is a 49 year old female seen in the multidisciplinary clinic for right breast cancer.  She opted for breast conserving surgery.  We discussed lumpectomy with sentinel lymph node mapping.  We discussed mastectomy reconstruction.  She opted for breast conserving surgery after reviewing all of her options.  Risk benefits and complications were all discussed with her as well as well as long-term expectations, and potential treatments.The procedure has been discussed with the patient. Alternatives to surgery have been discussed with the patient.  Risks of surgery include bleeding,  Infection,  Seroma formation, death,  and the need for further surgery.   The patient understands and wishes to proceed.Sentinel lymph node mapping and dissection has been discussed with the patient.  Risk of bleeding,  Infection,  Seroma formation,  Additional procedures,,  Shoulder weakness ,  Shoulder stiffness,  Nerve and blood vessel injury and reaction to the mapping dyes have been discussed.  Alternatives to surgery have been discussed with the patient.  The patient agrees to proceed.  Description of procedure: The patient was met in the holding area and questions were answered.  Neoprobe used to verify seed location right breast.  She underwent pectoral block per anesthesia and injection of technetium sulfur colloid per radiology.  All questions were answered and the  procedure was reviewed.  She was then taken back to the operating.  She is placed supine upon the OR table.  After induction of general esthesia, right breast was prepped and draped in sterile fashion and timeout performed.  Proper patient, site and procedure were verified.  Neoprobe was used to identify the seed right breast upper outer quadrant.  This was toward the right axilla.  We opted to make a single incision in the right axilla.  We have to localize the seed as well as the node using the same incision.  The lumpectomy was performed first.  All tissue around the seed clip were excised with a grossly negative margin.  The Faxitron image revealed the superior margin to close therefore we took that as a separate specimen sent separately.  Hemostasis was achieved.  Clips were placed in the cavity.  The same incision neoprobe settings were changed to technetium.  Were able to dissect down into the right axilla.  We identified 2 sentinel nodes that were hot and a third node that seem mildly enlarged.  These were all taken and were level 1 nodes.  Background counts approached baseline.  The long thoracic nerve thoracodorsal trunk and axillary vein were all preserved.  Hemostasis was achieved.  The wound was closed with 2 layers of deep layer 3-0 Vicryl and 4 Monocryl subcuticular stitch.  Dermabond applied.  All counts found to be correct.  Breast binder placed.  The patient was awoke extubated taken to recovery in satisfactory condition.  I was personally present during the key and critical portions of this procedure and immediately available throughout the entire procedure, as documented in my operative note.

## 2020-09-10 NOTE — Transfer of Care (Signed)
Immediate Anesthesia Transfer of Care Note  Patient: Monica [name]  Procedure(s) Performed: RIGHT BREAST LUMPECTOMY WITH RADIOACTIVE SEED AND SENTINEL LYMPH NODE MAPPING (Right Breast)  Patient Location: PACU  Anesthesia Type:General  Level of Consciousness: awake, alert  and oriented  Airway & Oxygen Therapy: Patient Spontanous Breathing and Patient connected to nasal cannula oxygen  Post-op Assessment: Report given to RN and Post -op Vital signs reviewed and stable  Post vital signs: Reviewed and stable  Last Vitals:  Vitals Value Taken Time  BP 134/83 09/10/20 1335  Temp    Pulse 97 09/10/20 1338  Resp 17 09/10/20 1338  SpO2 100 % 09/10/20 1338    Last Pain:  Vitals:   09/10/20 1131  TempSrc: Oral  PainSc: 0-No pain      Patients Stated Pain Goal: 6 (24/46/28 6381)  Complications: No complications documented.

## 2020-09-10 NOTE — Anesthesia Procedure Notes (Signed)
Anesthesia Regional Block: Pectoralis block   Pre-Anesthetic Checklist: ,, timeout performed, Correct Patient, Correct Site, Correct Laterality, Correct Procedure, Correct Position, site marked, Risks and benefits discussed,  Surgical consent,  Pre-op evaluation,  At surgeon's request and post-op pain management  Laterality: Right  Prep: chloraprep       Needles:  Injection technique: Single-shot  Needle Type: Echogenic Needle     Needle Length: 10cm  Needle Gauge: 21     Additional Needles:   Narrative:  Start time: 09/10/2020 11:56 AM End time: 09/10/2020 12:02 PM Injection made incrementally with aspirations every 5 mL.  Performed by: Personally  Anesthesiologist: Audry Pili, MD  Additional Notes: No pain on injection. No increased resistance to injection. Injection made in 5cc increments. Good needle visualization. Patient tolerated the procedure well.

## 2020-09-10 NOTE — Discharge Instructions (Signed)
°Post Anesthesia Home Care Instructions ° °Activity: °Get plenty of rest for the remainder of the day. A responsible individual must stay with you for 24 hours following the procedure.  °For the next 24 hours, DO NOT: °-Drive a car °-Operate machinery °-Drink alcoholic beverages °-Take any medication unless instructed by your physician °-Make any legal decisions or sign important papers. ° °Meals: °Start with liquid foods such as gelatin or soup. Progress to regular foods as tolerated. Avoid greasy, spicy, heavy foods. If nausea and/or vomiting occur, drink only clear liquids until the nausea and/or vomiting subsides. Call your physician if vomiting continues. ° °Special Instructions/Symptoms: °Your throat may feel dry or sore from the anesthesia or the breathing tube placed in your throat during surgery. If this causes discomfort, gargle with warm salt water. The discomfort should disappear within 24 hours. ° °If you had a scopolamine patch placed behind your ear for the management of post- operative nausea and/or vomiting: ° °1. The medication in the patch is effective for 72 hours, after which it should be removed.  Wrap patch in a tissue and discard in the trash. Wash hands thoroughly with soap and water. °2. You may remove the patch earlier than 72 hours if you experience unpleasant side effects which may include dry mouth, dizziness or visual disturbances. °3. Avoid touching the patch. Wash your hands with soap and water after contact with the patch. °  ° ° ° ° °[facility] °Office Phone Number [phone] ° °BREAST BIOPSY/ PARTIAL MASTECTOMY: POST OP INSTRUCTIONS ° °Always review your discharge instruction sheet given to you by the facility where your surgery was performed. ° °IF YOU HAVE DISABILITY OR FAMILY LEAVE FORMS, YOU MUST BRING THEM TO THE OFFICE FOR PROCESSING.  DO NOT GIVE THEM TO YOUR DOCTOR. ° °1. A prescription for pain medication may be given to you upon discharge.  Take your  pain medication as prescribed, if needed.  If narcotic pain medicine is not needed, then you may take acetaminophen (Tylenol) or ibuprofen (Advil) as needed. °2. Take your usually prescribed medications unless otherwise directed °3. If you need a refill on your pain medication, please contact your pharmacy.  They will contact our office to request authorization.  Prescriptions will not be filled after 5pm or on week-ends. °4. You should eat very light the first 24 hours after surgery, such as soup, crackers, pudding, etc.  Resume your normal diet the day after surgery. °5. Most patients will experience some swelling and bruising in the breast.  Ice packs and a good support bra will help.  Swelling and bruising can take several days to resolve.  °6. It is common to experience some constipation if taking pain medication after surgery.  Increasing fluid intake and taking a stool softener will usually help or prevent this problem from occurring.  A mild laxative (Milk of Magnesia or Miralax) should be taken according to package directions if there are no bowel movements after 48 hours. °7. Unless discharge instructions indicate otherwise, you may remove your bandages 24-48 hours after surgery, and you may shower at that time.  You may have steri-strips (small skin tapes) in place directly over the incision.  These strips should be left on the skin for 7-10 days.  If your surgeon used skin glue on the incision, you may shower in 24 hours.  The glue will flake off over the next 2-3 weeks.  Any sutures or staples will be removed at the office during your follow-up visit. °  8. ACTIVITIES:  You may resume regular daily activities (gradually increasing) beginning the next day.  Wearing a good support bra or sports bra minimizes pain and swelling.  You may have sexual intercourse when it is comfortable. °a. You may drive when you no longer are taking prescription pain medication, you can comfortably wear a seatbelt, and you can  safely maneuver your car and apply brakes. °b. RETURN TO WORK:  ______________________________________________________________________________________ °9. You should see your doctor in the office for a follow-up appointment approximately two weeks after your surgery.  Your doctor’s nurse will typically make your follow-up appointment when she calls you with your pathology report.  Expect your pathology report 2-3 business days after your surgery.  You may call to check if you do not hear from us after three days. °10. OTHER INSTRUCTIONS: _______________________________________________________________________________________________ _____________________________________________________________________________________________________________________________________ °_____________________________________________________________________________________________________________________________________ °_____________________________________________________________________________________________________________________________________ ° °WHEN TO CALL YOUR DOCTOR: °1. Fever over 101.0 °2. Nausea and/or vomiting. °3. Extreme swelling or bruising. °4. Continued bleeding from incision. °5. Increased pain, redness, or drainage from the incision. ° °The clinic staff is available to answer your questions during regular business hours.  Please don’t hesitate to call and ask to speak to one of the nurses for clinical concerns.  If you have a medical emergency, go to the nearest emergency room or call 911.  A surgeon from [facility] is always on call at the hospital. ° °For further questions, please visit [email or web address]  °

## 2020-09-11 ENCOUNTER — Encounter (HOSPITAL_BASED_OUTPATIENT_CLINIC_OR_DEPARTMENT_OTHER): Payer: Self-pay | Admitting: Surgery

## 2020-09-12 LAB — SURGICAL PATHOLOGY

## 2020-09-15 ENCOUNTER — Encounter: Payer: Self-pay | Admitting: *Deleted

## 2020-09-16 ENCOUNTER — Ambulatory Visit: Payer: Self-pay | Admitting: Surgery

## 2020-09-17 NOTE — Progress Notes (Signed)
Patient Care Team: Raiford Simmonds., PA-C as PCP - General  DIAGNOSIS:    ICD-10-CM   1. Primary malignant neoplasm of upper outer quadrant of female breast, right ([place])  C50.411     SUMMARY OF ONCOLOGIC HISTORY: Oncology History  Primary malignant neoplasm of upper outer quadrant of female breast, right ([place])  08/12/2020 Initial Diagnosis   Screening mammogram on 07/07/20 showed an asymmetry. Diagnostic mammogram and [place] on 07/29/20 showed a 0.8cm mass at the 10 o'clock position and no right axillary adenopathy. Biopsy on 08/12/20 showed invasive and in situ mammary carcinoma, grade 2, HER-2 equivocal by IHC, positive by FISH, ER+ 80%, PR+ 60%, Ki67 15%.   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     CHIEF COMPLIANT: Follow-up s/p lumpectomy   INTERVAL HISTORY: Monica [name] is a 49 y.o. with above-mentioned history of right breast cancer. She underwent a right lumpectomy on 09/10/20 with Dr. Brantley Stage for which pathology showed invasive lobular carcinoma, 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 She presents to the clinic today for follow-up to review the pathology report and discuss further treatment.   ALLERGIES:  has No Known Allergies.  MEDICATIONS:  Current Outpatient Medications  Medication Sig Dispense Refill  . albuterol (VENTOLIN HFA) 108 (90 Base) MCG/ACT inhaler Inhale into the lungs every 6 (six) hours as needed for wheezing or shortness of breath.    Marland Kitchen aspirin EC 81 MG tablet Take 81 mg by mouth daily.     . Black Currant Seed Oil 500 MG CAPS Take 1 capsule by mouth daily.    .  Cholecalciferol (VITAMIN D3) 125 MCG (5000 UT) TABS Take 1 tablet by mouth daily.    Marland Kitchen HYDROcodone-acetaminophen (NORCO/VICODIN) 5-325 MG tablet Take 1 tablet by mouth every 6 (six) hours as needed for moderate pain. 15 tablet 0  . ibuprofen (ADVIL) 800 MG tablet Take 1 tablet (800 mg total) by mouth every 8 (eight) hours as needed. 30 tablet 0  . lisinopril (ZESTRIL) 10 MG tablet Take 10 mg by mouth daily.     . montelukast (SINGULAIR) 10 MG tablet Take 10 mg by mouth daily.    . Multiple Vitamins-Minerals (AIRBORNE PO) Take 1 tablet by mouth daily.     No current facility-administered medications for this visit.    PHYSICAL EXAMINATION: ECOG PERFORMANCE STATUS: 1 - Symptomatic but completely ambulatory  Vitals:   09/18/20 1443  BP: (!) 128/91  Pulse: 81  Resp: 18  Temp: (!) 97.2 F (36.2 C)  SpO2: 99%   Filed Weights   09/18/20 1443  Weight: 189 lb (85.7 kg)    LABORATORY DATA:  I have reviewed the data as listed CMP Latest Ref Rng & Units 09/08/2020  Glucose 70 - 99 mg/dL 82  BUN 6 - 20 mg/dL 8  Creatinine 0.44 - 1.00 mg/dL 0.78  Sodium 135 - 145 mmol/L 137  Potassium 3.5 - 5.1 mmol/L 4.7  Chloride 98 - 111 mmol/L 104  CO2 22 - 32 mmol/L 27  Calcium 8.9 - 10.3 mg/dL 9.2  Total Protein 6.5 - 8.1 g/dL 6.9  Total Bilirubin 0.3 - 1.2 mg/dL 0.4  Alkaline Phos 38 - 126 U/L 90  AST 15 - 41 U/L 20  ALT 0 - 44 U/L 13    Lab Results  Component Value Date   WBC 7.7 09/08/2020   HGB 13.2 09/08/2020   HCT 39.2 09/08/2020   MCV 80.5 09/08/2020   PLT 268 09/08/2020   NEUTROABS 3.9 09/08/2020    ASSESSMENT & PLAN:  Primary malignant neoplasm of upper outer quadrant of female breast, right ([place]) 09/10/2020:Right lumpectomy ([facility]): invasive lobular carcinoma, 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  ER 80%, PR 60%, Ki-67 15%, HER-2 equivocal by IHC positive for fish  Pathology counseling: I discussed the final  pathology report of the patient provided  a copy of this report. I discussed the margins as well as lymph node surgeries. We also discussed the final staging along with previously performed ER/PR and HER-2/neu testing.  Recommendation: 1.  Reexcision of the positive posterior margin 2. adjuvant chemotherapy with Taxol Herceptin followed by Herceptin maintenance for 1 year: I discussed with her about different treatment options and she is extremely petrified about going on high dose chemotherapy with [place].  Therefore we will stay with Herceptin and Taxol and add antiestrogen and anti-HER-2 treatment for further adjuvant treatments. 3. Adjuvant radiation therapy followed by 4. Adjuvant antiestrogen therapy with anastrozole (patient is postmenopausal) and anti-HER-2 therapy with neratinib  I requested a port placement with Dr. Brantley Stage. Return to clinic to start chemotherapy after she heals from surgery.    No orders of the defined types were placed in this encounter.  The patient has a good understanding of the overall plan. she agrees with it. she will call with any problems that may develop before the next visit here.  Total time spent: 30 mins including face to face time and time spent for planning, charting and coordination of care  Nicholas Lose, MD 09/18/2020  I, Cloyde Reams Dorshimer, am acting as scribe for Dr. Nicholas Lose.  I have reviewed the above documentation for accuracy and completeness, and I agree with the above.

## 2020-09-18 ENCOUNTER — Encounter: Payer: Self-pay | Admitting: Licensed Clinical Social Worker

## 2020-09-18 ENCOUNTER — Other Ambulatory Visit: Payer: Self-pay | Admitting: *Deleted

## 2020-09-18 ENCOUNTER — Inpatient Hospital Stay: Payer: Medicaid Other | Attending: Hematology and Oncology | Admitting: Hematology and Oncology

## 2020-09-18 ENCOUNTER — Inpatient Hospital Stay: Payer: Medicaid Other | Admitting: Licensed Clinical Social Worker

## 2020-09-18 ENCOUNTER — Other Ambulatory Visit: Payer: Self-pay

## 2020-09-18 ENCOUNTER — Encounter: Payer: Self-pay | Admitting: *Deleted

## 2020-09-18 ENCOUNTER — Ambulatory Visit: Payer: Self-pay | Admitting: Surgery

## 2020-09-18 DIAGNOSIS — Z170 Estrogen receptor positive status [ER+]: Secondary | ICD-10-CM | POA: Insufficient documentation

## 2020-09-18 DIAGNOSIS — Z7982 Long term (current) use of aspirin: Secondary | ICD-10-CM | POA: Insufficient documentation

## 2020-09-18 DIAGNOSIS — Z79899 Other long term (current) drug therapy: Secondary | ICD-10-CM | POA: Diagnosis not present

## 2020-09-18 DIAGNOSIS — C50411 Malignant neoplasm of upper-outer quadrant of right female breast: Secondary | ICD-10-CM

## 2020-09-18 DIAGNOSIS — C773 Secondary and unspecified malignant neoplasm of axilla and upper limb lymph nodes: Secondary | ICD-10-CM | POA: Diagnosis present

## 2020-09-18 NOTE — Assessment & Plan Note (Signed)
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  ER 80%, PR 60%, Ki-67 15%, HER-2 equivocal by IHC positive for fish  Pathology counseling: I discussed the final pathology report of the patient provided  a copy of this report. I discussed the margins as well as lymph node surgeries. We also discussed the final staging along with previously performed ER/PR and HER-2/neu testing.  Recommendation: 1.  Reexcision of the positive posterior margin 2. adjuvant chemotherapy with Taxol Herceptin followed by Herceptin maintenance for 1 year: I discussed with her about different treatment options and she is extremely petrified about going on high dose chemotherapy with [place].  Therefore we will stay with Herceptin and Taxol and add antiestrogen and anti-HER-2 treatment for further adjuvant treatments. 3. Adjuvant radiation therapy followed by 4. Adjuvant antiestrogen therapy with anastrozole (patient is postmenopausal) and anti-HER-2 therapy with neratinib  I requested a port placement with Dr. Brantley Stage. Return to clinic to start chemotherapy after she heals from surgery.

## 2020-09-18 NOTE — Progress Notes (Signed)
[facility] CSW Progress Note  Holiday representative met with patient to provide ongoing adjustment counseling and resource support. Pt currently processing the need to undergo surgery again do to positive margin. This was unexpected and feels like a major setback for her. CSW allowed space for processing and encouraged patient in allowing herself to let it out and then refocus on reasons to live, like her grandson, to help her gather strength.   Patient also voiced stress over rent and utility payments today due to being out of work from surgery and upcoming chemo. CSW discussed possible resources.   Resources today: - [place] application submitted - [place] with [employer] (landlord) per pt's request to confirm diagnosis and time out of work (at least 4 months with surgery and chemo)   Information given for: [employer], [employer], [employer].   Next visit: 10/28   Edwinna Areola Cameshia Cressman LCSW

## 2020-09-18 NOTE — Progress Notes (Signed)
START OFF PATHWAY REGIMEN - Breast   [id number]:Paclitaxel + Trastuzumab:   A cycle is every 28 days:     Paclitaxel      Trastuzumab-xxxx      Trastuzumab-xxxx   **Always confirm dose/schedule in your pharmacy ordering system**  Patient Characteristics: Postoperative without Neoadjuvant Therapy (Pathologic Staging), Invasive Disease, Adjuvant Therapy, HER2 Positive, ER Positive, Node Positive, pT1, pN1a or Higher Therapeutic Status: Postoperative without Neoadjuvant Therapy (Pathologic Staging) AJCC Grade: G2 AJCC N Category: pN1 AJCC M Category: cM0 ER Status: Positive (+) AJCC 8 Stage Grouping: IA HER2 Status: Positive (+) Oncotype Dx Recurrence Score: Not Appropriate AJCC T Category: pT1c PR Status: Positive (+) Intent of Therapy: Curative Intent, Discussed with Patient

## 2020-09-19 ENCOUNTER — Ambulatory Visit: Payer: Self-pay | Admitting: Surgery

## 2020-09-19 NOTE — H&P (Signed)
Monica [name] Appointment: 09/19/2020 10:10 AM Location: [facility] Patient #: [id number] DOB: 1971-03-22 Single / Language: [name] / Race: Black or African American Female  History of Present Illness (Aragon Scarantino A. Matsuko Kretz MD; 09/19/2020 11:47 AM) Patient words: Patient returned week after right breast lumpectomy with sentinel mapping for invasive lobular carcinoma. Final pathology showed HER-2/neu to be positive. She does have 1 or 2 sentinel nodes positive for disease. Her posterior margin was involved and requires reexcision. She will also require chemotherapy per oncology and needs a port placed as well. I discussed this with her and she seems to understand this today.             FINAL MICROSCOPIC DIAGNOSIS:  A. BREAST, RIGHT, LUMPECTOMY: - Invasive lobular carcinoma, 1.2 cm, Nottingham grade 2 of 3. - Invasive carcinoma broadly involves the posterior margin. - Biopsy site. - See oncology table.  B. BREAST, RIGHT, ADDITIONAL SUPERIOR MARGIN, EXCISION: - Breast tissue, negative for carcinoma.  C. SENTINEL LYMPH NODE, RIGHT AXILLARY, BIOPSY: - One lymph node, negative for carcinoma (0/1).  D. SENTINEL LYMPH NODE, RIGHT AXILLARY, BIOPSY: - Metastatic carcinoma in (1) of (1) lymph node.  ONCOLOGY TABLE:  INVASIVE CARCINOMA OF THE BREAST: Resection  Procedure: Lumpectomy, Excision of additional superior margin Specimen Laterality: Right Tumor Size: Greatest dimension: 1.2 cm Histologic Type: Invasive lobular carcinoma Histologic Grade: Glandular (Acinar)/Tubular Differentiation: 3 Nuclear Pleomorphism: 2 Mitotic Rate: 1 Overall Grade: 2 Ductal Carcinoma In Situ: Not identified Tumor Extension: Limited to breast parenchyma Margins: Final: Posterior: Broadly involved Superior: At least 16 mm All other margins are 2 mm or greater Regional Lymph Nodes: Number of Lymph Nodes Examined:  2 Number of Sentinel Nodes Examined: 2 Number of Lymph Nodes with Macrometastases (>2 mm): 1 Number of Lymph Nodes with Micrometastases: 0 Number of Lymph Nodes with Isolated Tumor Cells (=0.2 mm or =200 cells): 0 Size of Largest Metastatic Deposit: 10 mm Extranodal Extension: Not identified Treatment Effect: No known presurgical therapy Breast Biomarker Testing Performed on Previous Biopsy: Yes Testing Performed on Case Number: [id number] Estrogen Receptor: 80%, positive, strong Progesterone Receptor: 60%, positive, strong HER2: By IHC: Equivocal (2+). By FISH: Positive. Ratio of HER2/CEN17 signals: 4.06. Average HER2 copy number per cell: 6.90. Ki-67: 15% Representative Tumor Block: A2 Pathologic Stage Classification (pTNM, AJCC 8th Edition): pT1c, pN1a (v4.4.0.0).  The patient is a 49 year old female.   Allergies ([name], [place]; 09/19/2020 9:58 AM) No Known Drug Allergies [08/22/2020]: Allergies Reconciled  Medication History ([name], [place]; 09/19/2020 9:58 AM) Lisinopril (10MG Tablet, Oral) Active. Loratadine (10MG Tablet, Oral) Active. Vitamin D3 (125 MCG(5000 UT) Tablet, Oral) Active. Aspirin (81MG Tablet, Oral) Active. HYDROcodone-Acetaminophen (5-325MG Tablet, Oral) Active. Medications Reconciled    Vitals ([name]; 09/19/2020 9:59 AM) 09/19/2020 9:58 AM Weight: 190.4 lb Height: 61in Body Surface Area: 1.85 m Body Mass Index: 35.98 kg/m  Temp.: 98.66F  Pulse: 103 (Regular)  BP: 134/86(Sitting, Left Arm, Standard)        Physical Exam (Maxime Beckner A. Kahdijah Errickson MD; 09/19/2020 11:48 AM)  Breast Note: Right breast incision clean dry and intact. Seroma noted. No signs of infection.    Assessment & Plan (Jonthan Leite A. Darris Carachure MD; 09/19/2020 11:48 AM)  POST-OPERATIVE STATE [phone]) Impression: Scheduled for right breast reexcision of  posterior margin and placement of port for chemotherapy. Pt requires port placement for chemotherapy. Risk include bleeding, infection, pneumothorax, hemothorax, mediastinal injury, nerve injury , blood vessel injury, strke, blood clots, death, migration. embolization and need  for additional procedures. Pt agrees to proceed. Risk of lumpectomy include bleeding, infection, seroma, more surgery, use of seed/wire, wound care, cosmetic deformity and the need for other treatments, death , blood clots, death. Pt agrees to proceed.  Current Plans Use of a central venous catheter for intravenous therapy was discussed. Technique of catheter placement using ultrasound and fluoroscopy guidance was discussed. Risks such as bleeding, infection, pneumothorax, catheter occlusion, reoperation, and other risks were discussed. I noted a good likelihood this will help address the problem. Questions were answered. The patient expressed understanding & wishes to proceed. Pt Education - [place]

## 2020-09-22 ENCOUNTER — Inpatient Hospital Stay: Payer: Medicaid Other

## 2020-09-22 ENCOUNTER — Other Ambulatory Visit: Payer: Self-pay | Admitting: Genetic Counselor

## 2020-09-22 ENCOUNTER — Encounter: Payer: Self-pay | Admitting: Genetic Counselor

## 2020-09-22 ENCOUNTER — Inpatient Hospital Stay (HOSPITAL_BASED_OUTPATIENT_CLINIC_OR_DEPARTMENT_OTHER): Payer: Medicaid Other | Admitting: Genetic Counselor

## 2020-09-22 ENCOUNTER — Other Ambulatory Visit: Payer: Self-pay

## 2020-09-22 ENCOUNTER — Telehealth: Payer: Self-pay | Admitting: Hematology and Oncology

## 2020-09-22 DIAGNOSIS — C50411 Malignant neoplasm of upper-outer quadrant of right female breast: Secondary | ICD-10-CM

## 2020-09-22 DIAGNOSIS — Z803 Family history of malignant neoplasm of breast: Secondary | ICD-10-CM

## 2020-09-22 HISTORY — DX: Family history of malignant neoplasm of breast: Z80.3

## 2020-09-22 NOTE — Telephone Encounter (Signed)
No 10/14 los, no changes made to pt schedule  

## 2020-09-22 NOTE — Progress Notes (Signed)
REFERRING PROVIDER: Nicholas Lose, MD [place],   [id number]  PRIMARY PROVIDER:  Raiford Simmonds., PA-C  PRIMARY REASON FOR VISIT:  1. Primary malignant neoplasm of upper outer quadrant of female breast, right ([place])   2. Family history of breast cancer    HISTORY OF PRESENT ILLNESS:   Monica [name], a 49 y.o. female, was seen for a [place] cancer genetics consultation at the request of Dr. Lindi Adie due to a personal and family history of breast cancer.  Monica [name] presents to clinic today with her mother to discuss the possibility of a hereditary predisposition to cancer, to discuss genetic testing, and to further clarify her future cancer risks, as well as potential cancer risks for family members.   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 Monica [name] had a right lumpectomy on 09/10/2020 and will have re-excision due to positive margin, adjuvant chemotherapy, and adjuvant anti-estrogens.   CANCER HISTORY:  Oncology History  Primary malignant neoplasm of upper outer quadrant of female breast, right ([place])  08/12/2020 Initial Diagnosis   Screening mammogram on 07/07/20 showed an asymmetry. Diagnostic mammogram and [place] on 07/29/20 showed a 0.8cm mass at the 10 o'clock position and no right axillary adenopathy. Biopsy on 08/12/20 showed invasive and in situ mammary carcinoma, grade 2, HER-2 equivocal by IHC, positive by FISH, ER+ 80%, PR+ 60%, Ki67 15%.   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   10/09/2020 -  Chemotherapy   The patient had PACLitaxel (TAXOL)  156 mg in sodium chloride 0.9 % 250 mL chemo infusion (</= 75m/m2), 80 mg/m2 = 156 mg, Intravenous,  Once, 0 of 3 cycles trastuzumab-dkst (OGIVRI) 336 mg in sodium chloride 0.9 % 250 mL chemo infusion, 4 mg/kg = 336 mg, Intravenous,  Once, 0 of 16 cycles  for chemotherapy treatment.      RISK FACTORS:  Menarche was at age [id number] [id number]  First live birth at age 49  OCP use for approximately 10 years.  Ovaries intact: unknown.  Hysterectomy: yes in late 30s/early 40s due to cyst per patient  Menopausal status: postmenopausal.  HRT use: 0 years. Colonoscopy: no; not examined. Mammogram within the last year: yes. Number of breast biopsies: 1. Up to date with pelvic exams: yes. Any excessive radiation exposure in the past: no  Past Medical History:  Diagnosis Date  . Asthma   . Family history of breast cancer 09/22/2020  . Hypertension     Past Surgical History:  Procedure Laterality Date  . ABDOMINAL HYSTERECTOMY    . APPENDECTOMY    . BREAST LUMPECTOMY WITH RADIOACTIVE SEED AND SENTINEL LYMPH NODE BIOPSY Right 09/10/2020   Procedure: RIGHT BREAST LUMPECTOMY WITH RADIOACTIVE SEED AND SENTINEL LYMPH NODE MAPPING;  Surgeon: CErroll Luna MD;  Location: [facility]  Service: General;  Laterality: Right;    Social History   Socioeconomic History  . Marital status: Single    Spouse name: Not on file  . Number of children: Not on file  . Years of education: Not on file  . Highest education level: Not on file  Occupational History  . Not on file  Tobacco Use  . Smoking status: Current Every Day Smoker    Packs/day: 0.50    Last attempt to quit: 08/22/2020    Years since quitting: 0.0  . Smokeless tobacco: Never Used  Vaping Use  . Vaping Use: Never used  Substance and Sexual Activity  . Alcohol use: No  . Drug use: Never  . Sexual activity: Not Currently    Birth control/protection: Surgical  Other Topics Concern  . Not on file  Social History Narrative    . Not on file   Social Determinants of Health   Financial Resource Strain:   . Difficulty of Paying Living Expenses: Not on file  Food Insecurity:   . Worried About Charity fundraiser in the Last Year: Not on file  . Ran Out of Food in the Last Year: Not on file  Transportation Needs:   . Lack of Transportation (Medical): Not on file  . Lack of Transportation (Non-Medical): Not on file  Physical Activity:   . Days of Exercise per Week: Not on file  . Minutes of Exercise per Session: Not on file  Stress:   . Feeling of Stress : Not on file  Social Connections:   . Frequency of Communication with Friends and Family: Not on file  . Frequency of Social Gatherings with Friends and Family: Not on file  . Attends Religious Services: Not on file  . Active Member of Clubs or Organizations: Not on file  . Attends Archivist Meetings: Not on file  . Marital Status: Not on file     FAMILY HISTORY:  We obtained a detailed, 4-generation family history.  Significant diagnoses are listed below: Family History  Problem Relation Age of Onset  . Breast cancer Maternal Aunt 70  . Breast cancer Paternal Aunt        dx at unknown age  . Breast cancer Other        MGM's niece; dx 79s  . Breast cancer Other        MGF's niece    Monica [name] has one daughter, age 59, who does not have a cancer history.  Monica [name] sisters, ages 88 and 37, do not have a cancer history.  Monica [name] mother is 68 and does not have a cancer history. Monica [name] maternal aunt, age 80, was diagnosed with ductal carcinoma in situ (DCIS) at age 21.  Monica [name] maternal grandmother's niece and maternal grandfather's niece both have a history of breast cancer.  No other maternal family history of cancer was reported.   Monica [name] father passed away at age 103 and did not have cancer.  Monica [name] paternal aunt was diagnosed with breast cancer.  She had limited information about the age of diagnosis.  No other paternal family history of cancer was reported.   Monica [name] is unaware of previous family history of genetic testing for hereditary cancer risks. Patient's maternal ancestors are of African American descent, and paternal ancestors are of African American descent. There is no reported Ashkenazi Jewish ancestry. There is no known consanguinity.  GENETIC COUNSELING ASSESSMENT: Monica [name] is a 49 y.o. female with a personal and family history of breast cancer which is somewhat suggestive of a hereditary cancer syndrome, such as Hereditary Breast and Ovarian Cancer Syndrome, and predisposition to cancer given the age of diagnosis and the family history of breast cancer. We, therefore, discussed and recommended the following at today's visit.  DISCUSSION: We discussed that 5 - 10% of cancer is hereditary, with most cases of hereditary breast cancer associated with mutations in BRCA1 and BRCA2.  There are other genes that can be associated with hereditary breast cancer syndromes.  The type of cancer risk and level of risk are gene-specific.  We discussed that testing is beneficial for several reasons including knowing how to follow individuals after completing their treatment, identifying whether potential treatment options would be beneficial, and understanding if other family members could be at risk for cancer and allowing them to undergo genetic testing.   We reviewed the characteristics, features and inheritance patterns of hereditary cancer syndromes. We also discussed genetic testing, including the appropriate family members to test, the process of testing, insurance coverage and turn-around-time for results. We discussed the implications of a negative, positive, carrier and/or variant of uncertain significant result. We recommended Monica [name] pursue genetic testing for a panel that includes genes associated with breast cancer.   Monica [name]  was offered a common hereditary cancer panel (48  genes) and an expanded pan-cancer panel (85 genes). Monica [name] was informed of the benefits and limitations of each panel, including that expanded pan-cancer panels contain several preliminary evidence genes that do not have clear management guidelines at this point in time.  We also discussed that as the number of genes included on a panel increases, the chances of variants of uncertain significance increases.  After considering the benefits and limitations of each gene panel, Monica [name]  elected to have an expanded pan-cancer panel through [facility].  The Multi-Cancer Panel offered by [facility] includes sequencing and/or deletion duplication testing of the following 85 genes: AIP, ALK, APC, ATM, AXIN2,BAP1,  BARD1, BLM, BMPR1A, BRCA1, BRCA2, BRIP1, CASR, CDC73, CDH1, CDK4, CDKN1B, CDKN1C, CDKN2A (p14ARF), CDKN2A (p16INK4a), CEBPA, CHEK2, CTNNA1, DICER1, DIS3L2, EGFR (c.2369C>T, p.Thr790Met variant only), EPCAM (Deletion/duplication testing only), FH, FLCN, GATA2, GPC3, GREM1 (Promoter region deletion/duplication testing only), HOXB13 (c.251G>A, p.Gly84Glu), HRAS, KIT, MAX, MEN1, MET, MITF (c.952G>A, p.Glu318Lys variant only), MLH1, MSH2, MSH3, MSH6, MUTYH, NBN, NF1, NF2, NTHL1, PALB2, PDGFRA, PHOX2B, PMS2, POLD1, POLE, POT1, PRKAR1A, PTCH1, PTEN, RAD50, RAD51C, RAD51D, RB1, RECQL4, RET, RNF43, RUNX1, SDHAF2, SDHA (sequence changes only), SDHB, SDHC, SDHD, SMAD4, SMARCA4, SMARCB1, SMARCE1, STK11, SUFU, TERC, TERT, TMEM127, TP53, TSC1, TSC2, VHL, WRN and WT1.   Based on Ms. Mcpeters's personal and family history of cancer, she meets medical criteria for genetic testing. Despite that she meets criteria, she may still have an out of pocket cost. We discussed that if her out of pocket cost for testing is over $100, the laboratory will call and confirm whether she wants to proceed with testing.  If the out of pocket cost of testing is less than $100 she will be billed by the genetic testing laboratory.   PLAN: After  considering the risks, benefits, and limitations, Ms. Dewald provided informed consent to pursue genetic testing and the blood sample was sent to [facility] for analysis of the Multi-Cancer Panel. Results should be available within approximately 3 weeks' time, at which point they will be disclosed by telephone to Ms. Harriss, as will any additional recommendations warranted by these results. Ms. Mcglade will receive a summary of her genetic counseling visit and a copy of her results once available. This information will also be available in Epic.   Lastly, we encouraged Ms. Forge to remain in contact with cancer genetics annually so that we can continuously update the family history and inform her of any changes  in cancer genetics and testing that may be of benefit for this family.   Ms. Brandau questions were answered to her satisfaction today. Our contact information was provided should additional questions or concerns arise. Thank you for the referral and allowing [place] to share in the care of your patient.   Jaxtin Raimondo M. Joette Catching, [place], [facility] Certified Film/video [email or web address] (P) [phone]  The patient was seen for a total of 30 minutes in face-to-face genetic counseling.  This patient was discussed with Drs. Magrinat, Lindi Adie and/or Burr Medico who agrees with the above.    _______________________________________________________________________ For Office Staff:  Number of people involved in session: 1 Was an Intern/ student involved with case: no

## 2020-09-23 LAB — GENETIC SCREENING ORDER

## 2020-09-24 ENCOUNTER — Encounter: Payer: Self-pay | Admitting: *Deleted

## 2020-09-24 ENCOUNTER — Telehealth: Payer: Self-pay | Admitting: Hematology and Oncology

## 2020-09-24 NOTE — Telephone Encounter (Signed)
Scheduled appointment per 10/20 scheduling message. Spoke to patient who is aware of appointments dates and time.

## 2020-09-25 ENCOUNTER — Other Ambulatory Visit: Payer: Self-pay

## 2020-09-25 ENCOUNTER — Inpatient Hospital Stay: Payer: Medicaid Other

## 2020-09-26 ENCOUNTER — Other Ambulatory Visit: Payer: Medicaid Other

## 2020-09-26 ENCOUNTER — Telehealth: Payer: Self-pay | Admitting: Hematology and Oncology

## 2020-09-26 ENCOUNTER — Ambulatory Visit (HOSPITAL_COMMUNITY)
Admission: RE | Admit: 2020-09-26 | Discharge: 2020-09-26 | Disposition: A | Discharge status: Home / Self Care | Payer: Medicaid Other | Source: Ambulatory Visit | Attending: Hematology and Oncology | Admitting: Hematology and Oncology

## 2020-09-26 DIAGNOSIS — I10 Essential (primary) hypertension: Secondary | ICD-10-CM | POA: Insufficient documentation

## 2020-09-26 DIAGNOSIS — C50411 Malignant neoplasm of upper-outer quadrant of right female breast: Secondary | ICD-10-CM | POA: Diagnosis present

## 2020-09-26 DIAGNOSIS — Z0189 Encounter for other specified special examinations: Secondary | ICD-10-CM | POA: Diagnosis not present

## 2020-09-26 LAB — ECHOCARDIOGRAM COMPLETE
Area-P 1/2: 3.72 cm2
S' Lateral: 2.3 cm

## 2020-09-26 NOTE — Progress Notes (Signed)
°  Echocardiogram 2D Echocardiogram has been performed.  Monica [name] 09/26/2020, 11:14 AM

## 2020-09-26 NOTE — Telephone Encounter (Signed)
Scheduled appointment per 10/20 sch msg. Called patient, no answer. No voicemail set up. Will mail patient calendar with appointments.

## 2020-09-29 ENCOUNTER — Encounter: Payer: Self-pay | Admitting: *Deleted

## 2020-10-02 ENCOUNTER — Other Ambulatory Visit: Payer: Self-pay

## 2020-10-02 ENCOUNTER — Inpatient Hospital Stay: Payer: Medicaid Other | Admitting: Licensed Clinical Social Worker

## 2020-10-02 DIAGNOSIS — C50411 Malignant neoplasm of upper-outer quadrant of right female breast: Secondary | ICD-10-CM

## 2020-10-02 NOTE — Progress Notes (Signed)
[facility] CSW Progress Note  Clinical Social Worker met with patient to provide ongoing supportive counseling. Patient tearful today as she had a stressful day due to interactions with leasing company and trying to reduce her rent while out of work. Per pt, they did not accept the letter from her employer saying she is out on medical leave, without pay, so she actually had to quit her job. In addition, patient has surgery coming up on 11/11 for re-excision and will then start chemotherapy on 11/29 and is sad that she will lose her hair. CSW provided empathic listening, normalized feelings, and helped patient begin identifying ways to cope with hair loss.   Pt brought in application for [employer] which CSW will submit. CSW provided information on [employer] for potential help getting a wig or hats/scarves.  Patient declined support group or peer mentor at this time, but will think about it.   CSW to see patient next on 11/29 during first infusion.   Michelle E Stoisits LCSW 

## 2020-10-03 ENCOUNTER — Encounter: Payer: Self-pay | Admitting: Licensed Clinical Social Worker

## 2020-10-05 ENCOUNTER — Other Ambulatory Visit: Payer: Self-pay

## 2020-10-05 ENCOUNTER — Emergency Department (HOSPITAL_COMMUNITY)
Admission: EM | Admit: 2020-10-05 | Discharge: 2020-10-05 | Disposition: A | Discharge status: Home / Self Care | Payer: Medicaid Other | Attending: Emergency Medicine | Admitting: Emergency Medicine

## 2020-10-05 ENCOUNTER — Encounter (HOSPITAL_COMMUNITY): Payer: Self-pay

## 2020-10-05 DIAGNOSIS — I10 Essential (primary) hypertension: Secondary | ICD-10-CM | POA: Diagnosis not present

## 2020-10-05 DIAGNOSIS — J45909 Unspecified asthma, uncomplicated: Secondary | ICD-10-CM | POA: Diagnosis not present

## 2020-10-05 DIAGNOSIS — Z79899 Other long term (current) drug therapy: Secondary | ICD-10-CM | POA: Insufficient documentation

## 2020-10-05 DIAGNOSIS — F17200 Nicotine dependence, unspecified, uncomplicated: Secondary | ICD-10-CM | POA: Insufficient documentation

## 2020-10-05 DIAGNOSIS — J069 Acute upper respiratory infection, unspecified: Secondary | ICD-10-CM | POA: Insufficient documentation

## 2020-10-05 DIAGNOSIS — Z20822 Contact with and (suspected) exposure to covid-19: Secondary | ICD-10-CM | POA: Insufficient documentation

## 2020-10-05 DIAGNOSIS — Z7982 Long term (current) use of aspirin: Secondary | ICD-10-CM | POA: Insufficient documentation

## 2020-10-05 DIAGNOSIS — R0981 Nasal congestion: Secondary | ICD-10-CM | POA: Diagnosis present

## 2020-10-05 LAB — RESPIRATORY PANEL BY RT PCR (FLU A&B, COVID)
Influenza A by PCR: NEGATIVE
Influenza B by PCR: NEGATIVE
SARS Coronavirus 2 by RT PCR: NEGATIVE

## 2020-10-05 MED ORDER — ALBUTEROL SULFATE HFA 108 (90 BASE) MCG/ACT IN AERS: 2.0000 | INHALATION_SPRAY | RESPIRATORY_TRACT | 3 refills | Status: DC | PRN

## 2020-10-05 MED ORDER — DOXYCYCLINE HYCLATE 100 MG PO CAPS: 100.0000 mg | ORAL_CAPSULE | Freq: Two times a day (BID) | ORAL | 0 refills | Status: AC

## 2020-10-05 NOTE — ED Triage Notes (Addendum)
Pt presents to ED with complaints of wheezing x 4-5 days, and congestion. Pt states she is scheduled for surgery 10/16/20 and needs to get it cleared up before then. No audible wheezing heard. NAD at this time.

## 2020-10-05 NOTE — ED Provider Notes (Signed)
[facility] EMERGENCY DEPARTMENT Provider Note   CSN: [id number] Arrival date & time: 10/05/20  1234     History Chief Complaint  Patient presents with  . Nasal Congestion    Monica [name] is a 49 y.o. female with a history of breast malignancy presenting to emergency department with complaint of congestion and cough and wheezing for the past 4 to 5 days.  Patient denies any fevers at home.  She said it began with a "head cold" that has moved down into her chest.  She is now having cough with mucus.  It helped a little bit with Mucinex.  The cough is worse at night.    She is concerned because she is scheduled for an elective surgery for her breast cancer on November 11, and is concerned that they may have to postpone her procedure if she is sick.  NKDA Hx of asthma Not taking albuterol at home for asthma  HPI     Past Medical History:  Diagnosis Date  . Asthma   . Family history of breast cancer 09/22/2020  . Hypertension     Patient Active Problem List   Diagnosis Date Noted  . Family history of breast cancer 09/22/2020  . Primary malignant neoplasm of upper outer quadrant of female breast, right ([place]) 08/27/2020    Past Surgical History:  Procedure Laterality Date  . ABDOMINAL HYSTERECTOMY    . APPENDECTOMY    . BREAST LUMPECTOMY WITH RADIOACTIVE SEED AND SENTINEL LYMPH NODE BIOPSY Right 09/10/2020   Procedure: RIGHT BREAST LUMPECTOMY WITH RADIOACTIVE SEED AND SENTINEL LYMPH NODE MAPPING;  Surgeon: Erroll Luna, MD;  Location: [place];  Service: General;  Laterality: Right;     OB History   No obstetric history on file.     Family History  Problem Relation Age of Onset  . Hypertension Father   . Hypertension Sister   . Breast cancer Maternal Aunt 4  . Breast cancer Paternal Aunt        dx at unknown age  . Hypertension Maternal Grandmother   . Breast cancer Other        MGM's niece; dx 24s  . Breast cancer Other         MGF's niece    Social History   Tobacco Use  . Smoking status: Current Every Day Smoker    Packs/day: 0.50    Last attempt to quit: 08/22/2020    Years since quitting: 0.1  . Smokeless tobacco: Never Used  Vaping Use  . Vaping Use: Never used  Substance Use Topics  . Alcohol use: No  . Drug use: Never    Home Medications Prior to Admission medications   Medication Sig Start Date End Date Taking? Authorizing Provider  albuterol (VENTOLIN HFA) 108 (90 Base) MCG/ACT inhaler Inhale into the lungs every 6 (six) hours as needed for wheezing or shortness of breath.    [provider]  albuterol (VENTOLIN HFA) 108 (90 Base) MCG/ACT inhaler Inhale 2 puffs into the lungs every 4 (four) hours as needed for wheezing or shortness of breath. 10/05/20   Wyvonnia Dusky, MD  aspirin EC 81 MG tablet Take 81 mg by mouth daily.     [provider]  Black Currant Seed Oil 500 MG CAPS Take 1 capsule by mouth daily.    [provider]  Cholecalciferol (VITAMIN D3) 125 MCG (5000 UT) TABS Take 1 tablet by mouth daily. 05/14/20   [provider]  doxycycline (  VIBRAMYCIN) 100 MG capsule Take 1 capsule (100 mg total) by mouth 2 (two) times daily for 7 days. 10/05/20 10/12/20  Wyvonnia Dusky, MD  HYDROcodone-acetaminophen (NORCO/VICODIN) 5-325 MG tablet Take 1 tablet by mouth every 6 (six) hours as needed for moderate pain. 09/10/20   [facility]ett, Marcello Moores, MD  ibuprofen (ADVIL) 800 MG tablet Take 1 tablet (800 mg total) by mouth every 8 (eight) hours as needed. 09/10/20   Cornett, Marcello Moores, MD  lisinopril (ZESTRIL) 10 MG tablet Take 10 mg by mouth daily.     [provider]  montelukast (SINGULAIR) 10 MG tablet Take 10 mg by mouth daily. 08/20/20   [provider]  Multiple Vitamins-Minerals (AIRBORNE PO) Take 1 tablet by mouth daily.    [provider]    Allergies    Patient has no known allergies.  Review of Systems   Review of Systems   Constitutional: Negative for chills and fever.  HENT: Positive for congestion. Negative for sore throat.   Eyes: Negative for pain and visual disturbance.  Respiratory: Positive for cough, shortness of breath and wheezing.   Cardiovascular: Negative for chest pain and palpitations.  Gastrointestinal: Negative for abdominal pain, nausea and vomiting.  Genitourinary: Negative for dysuria and hematuria.  Musculoskeletal: Negative for arthralgias and myalgias.  Skin: Negative for color change and rash.  Neurological: Negative for syncope and headaches.  Psychiatric/Behavioral: Negative for agitation and confusion.  All other systems reviewed and are negative.   Physical Exam Updated Vital Signs BP (!) 118/103   Pulse 72   Temp 98.7 F (37.1 C) (Oral)   Resp 16   Ht 5\' 3"  (1.6 m)   Wt 77.1 kg   SpO2 100%   BMI 30.11 kg/m   Physical Exam Vitals and nursing note reviewed.  Constitutional:      General: She is not in acute distress.    Appearance: She is well-developed.  HENT:     Head: Normocephalic and atraumatic.  Eyes:     Conjunctiva/sclera: Conjunctivae normal.  Cardiovascular:     Rate and Rhythm: Normal rate and regular rhythm.     Pulses: Normal pulses.     Heart sounds: No murmur heard.   Pulmonary:     Effort: Pulmonary effort is normal. No respiratory distress.     Comments: Faint end expiratory wheezing bilaterally Abdominal:     General: There is no distension.     Palpations: Abdomen is soft.     Tenderness: There is no abdominal tenderness.  Musculoskeletal:     Cervical back: Neck supple.  Skin:    General: Skin is warm and dry.  Neurological:     General: No focal deficit present.     Mental Status: She is alert and oriented to person, place, and time.  Psychiatric:        Mood and Affect: Mood normal.        Behavior: Behavior normal.     ED Results / Procedures / Treatments   Labs (all labs ordered are listed, but only abnormal results are  displayed) Labs Reviewed  RESPIRATORY PANEL BY RT PCR (FLU A&B, COVID)    EKG None  Radiology No results found.  Procedures Procedures (including critical care time)  Medications Ordered in ED Medications - No data to display  ED Course  I have reviewed the triage vital signs and the nursing notes.  Pertinent labs & imaging results that were available during my care of the patient were reviewed by me  and considered in my medical decision making (see chart for details).  49 yo female here with suspected viral URI symptoms for 4-5 days  Covid/flu negative Clinically well appearing on exam Doubt bacterial PNA or sinusitis at this time  Will prescribe albuterol for faint wheezing at home Discussed watch and wait prescription with doxcycline if she goes on to develop fevers or has persistent symptoms beyond 7 days   Okay for discharge    Final Clinical Impression(s) / ED Diagnoses Final diagnoses:  Upper respiratory tract infection, unspecified type    Rx / DC Orders ED Discharge Orders         Ordered    albuterol (VENTOLIN HFA) 108 (90 Base) MCG/ACT inhaler  Every 4 hours PRN        10/05/20 1511    doxycycline (VIBRAMYCIN) 100 MG capsule  2 times daily        10/05/20 1511           Wyvonnia Dusky, MD 10/05/20 1515

## 2020-10-05 NOTE — Discharge Instructions (Signed)
Your COVID and Influenza test was negative today.  You likely have a virus causing your symptoms.  This can take 10-14 days to resolve. At this time I do NOT think this is a bacterial pneumonia or bacterial infection.   However, If you continue having a cough with mucous in 7 days, or start having a fever (temperature over 100.4 F), I provided an antibiotic script you can fill and begin taking.  I also gave you albuterol to use as needed at home for wheezing and chest tightness.

## 2020-10-07 ENCOUNTER — Telehealth: Payer: Self-pay | Admitting: Genetic Counselor

## 2020-10-07 NOTE — Telephone Encounter (Signed)
Contacted patient in attempt to disclose results of genetic testing.  LVM with contact information requesting a call back.  

## 2020-10-08 ENCOUNTER — Ambulatory Visit: Payer: Self-pay | Admitting: Genetic Counselor

## 2020-10-08 ENCOUNTER — Encounter: Payer: Self-pay | Admitting: Genetic Counselor

## 2020-10-08 DIAGNOSIS — Z1379 Encounter for other screening for genetic and chromosomal anomalies: Secondary | ICD-10-CM

## 2020-10-08 DIAGNOSIS — C50411 Malignant neoplasm of upper-outer quadrant of right female breast: Secondary | ICD-10-CM

## 2020-10-08 DIAGNOSIS — Z803 Family history of malignant neoplasm of breast: Secondary | ICD-10-CM

## 2020-10-08 NOTE — Telephone Encounter (Signed)
Revealed negative genetic testing and variants of uncertain significance in ATM and WRN.  Discussed that we do not know why she has breast cancer or why there is cancer in the family. It could be sporadic/familial, due to a different gene that we are not testing, or maybe our current technology may not be able to pick something up.  It will be important for her to keep in contact with genetics to keep up with whether additional testing may be needed.

## 2020-10-08 NOTE — Progress Notes (Addendum)
HPI:  Ms. Dunstan was previously seen in the [facility] due to a personal and family history of breast cancer and concerns regarding a hereditary predisposition to cancer. Please refer to our prior cancer genetics clinic note for more information regarding our discussion, assessment and recommendations, at the time. Ms. Niehoff recent genetic test results were disclosed to her, as were recommendations warranted by these results. These results and recommendations are discussed in more detail below.  CANCER HISTORY:  Oncology History  Primary malignant neoplasm of upper outer quadrant of female breast, right ([facility])  08/12/2020 Initial Diagnosis   Screening mammogram on 07/07/20 showed an asymmetry. Diagnostic mammogram and [place] on 07/29/20 showed a 0.8cm mass at the 10 o'clock position and no right axillary adenopathy. Biopsy on 08/12/20 showed invasive and in situ mammary carcinoma, grade 2, HER-2 equivocal by IHC, positive by FISH, ER+ 80%, PR+ 60%, Ki67 15%.   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   10/09/2020 -  Chemotherapy   The patient had PACLitaxel (TAXOL) 156 mg in sodium chloride 0.9 % 250 mL chemo infusion (</= 11m/m2), 80 mg/m2 = 156 mg, Intravenous,  Once, 0 of 3 cycles trastuzumab-dkst (OGIVRI) 336 mg in sodium chloride 0.9 % 250 mL chemo infusion, 4 mg/kg = 336 mg, Intravenous,  Once, 0 of 16 cycles  for chemotherapy treatment.     Genetic Testing   Negative genetic testing: no pathogenic variants detected in [facility] Multi-Cancer Panel.  Variants of uncertain significance detected in ATM (c.131A>G (p.Asp44Gly) and c.4658A>C (p.Glu1553Ala)) and  WRN (c.2825+6G>T (intronic)).  The report date is October 02, 2020.   The Multi-Cancer Panel offered by [facility] includes sequencing and/or deletion duplication testing of the following 85 genes: AIP, ALK, APC, ATM, AXIN2,BAP1,  BARD1, BLM, BMPR1A, BRCA1, BRCA2, BRIP1, CASR, CDC73, CDH1, CDK4, CDKN1B, CDKN1C, CDKN2A (p14ARF), CDKN2A (p16INK4a), CEBPA, CHEK2, CTNNA1, DICER1, DIS3L2, EGFR (c.2369C>T, p.Thr790Met variant only), EPCAM (Deletion/duplication testing only), FH, FLCN, GATA2, GPC3, GREM1 (Promoter region deletion/duplication testing only), HOXB13 (c.251G>A, p.Gly84Glu), HRAS, KIT, MAX, MEN1, MET, MITF (c.952G>A, p.Glu318Lys variant only), MLH1, MSH2, MSH3, MSH6, MUTYH, NBN, NF1, NF2, NTHL1, PALB2, PDGFRA, PHOX2B, PMS2, POLD1, POLE, POT1, PRKAR1A, PTCH1, PTEN, RAD50, RAD51C, RAD51D, RB1, RECQL4, RET, RNF43, RUNX1, SDHAF2, SDHA (sequence changes only), SDHB, SDHC, SDHD, SMAD4, SMARCA4, SMARCB1, SMARCE1, STK11, SUFU, TERC, TERT, TMEM127, TP53, TSC1, TSC2, VHL, WRN and WT1.      FAMILY HISTORY:  We obtained a detailed, 4-generation family history.  Significant diagnoses are listed below: Family History  Problem Relation Age of Onset  . Breast cancer Maternal Aunt 528 . Breast cancer Paternal Aunt        dx at unknown age  . Breast cancer Other        MGM's niece; dx 431s . Breast cancer Other        MGF's niece      Ms. Greenblatt has one daughter, age 50 who does not have a cancer history.  Ms. MBehrlesisters, ages 482and 436 do not have a cancer history.  Ms. MTalamomother is 67and does not have a cancer history. Ms. MJanuarymaternal aunt, age 50 was diagnosed with ductal carcinoma in  situ (DCIS) at age 73.  Ms. Stampley maternal grandmother's niece and maternal grandfather's niece both have a history of breast cancer.  No other maternal family history of cancer was reported.   Ms. Greenwood father passed away at age 13 and did not have cancer.  Ms. Daugherty paternal aunt was  diagnosed with breast cancer.  She had limited information about the age of diagnosis. No other paternal family history of cancer was reported.   Ms. Lamy is unaware of previous family history of genetic testing for hereditary cancer risks. Patient's maternal ancestors are of African American descent, and paternal ancestors are of African American descent. There is no reported Ashkenazi Jewish ancestry. There is no known consanguinity.  GENETIC TEST RESULTS: Genetic testing reported out on October 02, 2020.  The [facility] Multi-Cancer Panel found no pathogenic mutations. The Multi-Cancer Panel offered by [facility] includes sequencing and/or deletion duplication testing of the following 85 genes: AIP, ALK, APC, ATM, AXIN2,BAP1,  BARD1, BLM, BMPR1A, BRCA1, BRCA2, BRIP1, CASR, CDC73, CDH1, CDK4, CDKN1B, CDKN1C, CDKN2A (p14ARF), CDKN2A (p16INK4a), CEBPA, CHEK2, CTNNA1, DICER1, DIS3L2, EGFR (c.2369C>T, p.Thr790Met variant only), EPCAM (Deletion/duplication testing only), FH, FLCN, GATA2, GPC3, GREM1 (Promoter region deletion/duplication testing only), HOXB13 (c.251G>A, p.Gly84Glu), HRAS, KIT, MAX, MEN1, MET, MITF (c.952G>A, p.Glu318Lys variant only), MLH1, MSH2, MSH3, MSH6, MUTYH, NBN, NF1, NF2, NTHL1, PALB2, PDGFRA, PHOX2B, PMS2, POLD1, POLE, POT1, PRKAR1A, PTCH1, PTEN, RAD50, RAD51C, RAD51D, RB1, RECQL4, RET, RNF43, RUNX1, SDHAF2, SDHA (sequence changes only), SDHB, SDHC, SDHD, SMAD4, SMARCA4, SMARCB1, SMARCE1, STK11, SUFU, TERC, TERT, TMEM127, TP53, TSC1, TSC2, VHL, WRN and WT1.   The test report has been scanned into EPIC and is located under the Molecular Pathology section of the Results Review tab.  A portion of the result report is included below for reference.     We discussed with Ms. Milosevic that because current genetic testing is not perfect, it is possible there may be a gene mutation in one of these genes that current testing cannot detect, but that chance is small.  We also discussed, that there  could be another gene that has not yet been discovered, or that we have not yet tested, that is responsible for the cancer diagnoses in the family. It is also possible there is a hereditary cause for the cancer in the family that Ms. Sellen did not inherit and therefore was not identified in her testing.  Therefore, it is important to remain in touch with cancer genetics in the future so that we can continue to offer Ms. Silberstein the most up to date genetic testing.   Genetic testing did identify three variants of uncertain significance (VUS) - one in the ATM gene called c.131A>G (p.Asp44Gly), a second in the ATM gene called c.4658A>C (p.Glu1553Ala), and a third in the [place] gene called c.2825+6G>T (Intronic).  At this time, it is unknown if these variants are associated with increased cancer risk or if they are normal findings, but most variants such as these get reclassified to being inconsequential. They should not be used to make medical management decisions. With time, we suspect the lab will determine the significance of these variants, if any. If we do learn more about them, we will try to contact Ms. Kreps to discuss it further. However, it is important to stay in touch with [place] periodically and keep the address and phone number up to date.  Amended report: The variant of uncertain significance (VUS) in [place] at  c.2825+6G>T (Intronic) has been reclassified to likely benign.  The  change in variant classification was made as a result of re-review of evidence in light of new variant interpretation guidelines and/or new information. The amended report date is December 18, 2020.      ADDITIONAL GENETIC TESTING: We discussed with Ms. Amir that her genetic testing was fairly extensive.  If there are genes identified to increase cancer risk that can be analyzed in the future, we would be happy to discuss and coordinate this testing at that time.    CANCER SCREENING RECOMMENDATIONS: Ms. Widger test  result is considered negative (normal).  This means that we have not identified a hereditary cause for her personal and family history of breast cancer at this time. Most cancers happen by chance and this negative test suggests that her cancer may fall into this category.    While reassuring, this does not definitively rule out a hereditary predisposition to cancer. It is still possible that there could be genetic mutations that are undetectable by current technology. There could be genetic mutations in genes that have not been tested or identified to increase cancer risk.  Therefore, it is recommended she continue to follow the cancer management and screening guidelines provided by her oncology and primary healthcare provider.   An individual's cancer risk and medical management are not determined by genetic test results alone. Overall cancer risk assessment incorporates additional factors, including personal medical history, family history, and any available genetic information that may result in a personalized plan for cancer prevention and surveillance  RECOMMENDATIONS FOR FAMILY MEMBERS:  Individuals in this family might be at some increased risk of developing cancer, over the general population risk, simply due to the family history of cancer.  We recommended women in this family have a yearly mammogram beginning at age 81, or 81 years younger than the earliest onset of cancer, an annual clinical breast exam, and perform monthly breast self-exams. Women in this family should also have a gynecological exam as recommended by their primary provider. All family members should be referred for colonoscopy starting at age 32.  It is also possible there is a hereditary cause for the cancer in Ms. Single family that she did not inherit and therefore was not identified in her.  Based on Ms. Lavallee's family history, we recommended her maternal cousin, who was diagnosed with breast cancer in her 49s, have  genetic counseling and testing. Ms. Bassette will let us know if we can be of any assistance in coordinating genetic counseling and/or testing for this family member.   FOLLOW-UP: Lastly, we discussed with Ms. Empie that cancer genetics is a rapidly advancing field and it is possible that new genetic tests will be appropriate for her and/or her family members in the future. We encouraged her to remain in contact with cancer genetics on an annual basis so we can update her personal and family histories and let her know of advances in cancer genetics that may benefit this family.   Our contact number was provided. Ms. Tortorelli questions were answered to her satisfaction, and she knows she is welcome to call us at anytime with additional questions or concerns.   Verdell Kincannon M. Joette Catching, [place], [facility] Certified Film/video [email or web address] (P) [phone]

## 2020-10-09 ENCOUNTER — Encounter (HOSPITAL_BASED_OUTPATIENT_CLINIC_OR_DEPARTMENT_OTHER): Payer: Self-pay | Admitting: Surgery

## 2020-10-09 ENCOUNTER — Other Ambulatory Visit: Payer: Self-pay

## 2020-10-10 ENCOUNTER — Ambulatory Visit: Payer: Self-pay | Admitting: Surgery

## 2020-10-10 NOTE — H&P (Signed)
Monica [name] Appointment: 10/10/2020 10:00 AM Location: [facility] Patient #: [id number] DOB: [birth year] Single / Language: [name] / Race: Black or African American Female  History of Present Illness Monica Moores A. Monica Jaye MD; 10/10/2020 12:42 PM) Patient words: Patient returns for follow-up of her right breast cancer. Should a positive margin scheduled for reexcision and oncology has requested a port to be placed which I discussed with her today.  The patient is a 49 year old female.   Allergies [name], [place]; 10/10/2020 10:10 AM) No Known Drug Allergies [08/22/2020]: Allergies Reconciled  Medication History [name], [place]; 10/10/2020 10:11 AM) ProAir HFA (108 (90 Base)MCG/ACT Aerosol Soln, Inhalation) Active. Doxycycline Hyclate (100MG  Capsule, Oral) Active. Vitamin D-3 (125 MCG(5000 UT) Tablet, Oral) Active. Medications Reconciled    Vitals [name] RMA; 10/10/2020 10:12 AM) 10/10/2020 10:11 AM Weight: 190.25 lb Height: 64in Body Surface Area: 1.91 m Body Mass Index: 32.66 kg/m  Temp.: 97.9F  Pulse: 86 (Regular)  P.OX: 98% (Room air) BP: 136/84(Sitting, Left Arm, Standard)        Physical Exam ([name] A. Kelvyn Schunk MD; 10/10/2020 12:43 PM)  Breast Note: Right breast incision clean dry and intact.    Assessment & Plan (Magalie Almon A. Yarelie Hams MD; 10/10/2020 12:43 PM)  POST-OPERATIVE STATE [phone]) Impression: Scheduled for right breast reexcision of posterior margin and placement of port for chemotherapy. Pt requires port placement for chemotherapy. Risk include bleeding, infection, pneumothorax, hemothorax, mediastinal injury, nerve injury , blood vessel injury, stroke, blood clots, death, migration. embolization and need for additional procedures. Pt agrees to proceed. Risk of lumpectomy include bleeding, infection, seroma, more surgery, use of seed/wire, wound care, cosmetic deformity and the need for other treatments, death ,  blood clots, death. Pt agrees to proceed.  Current Plans Pt Education - [facility]

## 2020-10-10 NOTE — H&P (View-Only) (Signed)
[name] Appointment: 10/10/2020 10:00 AM Location: [facility] Patient #: [id number] DOB: 10-26-1971 Single / Language: [name] / Race: Black or African American Female  History of Present Illness Marcello Moores A. Darel Ricketts MD; 10/10/2020 12:42 PM) Patient words: Patient returns for follow-up of her right breast cancer. Should a positive margin scheduled for reexcision and oncology has requested a port to be placed which I discussed with her today.  The patient is a 49 year old female.   Allergies [name], [place]; 10/10/2020 10:10 AM) No Known Drug Allergies [08/22/2020]: Allergies Reconciled  Medication History [name], [place]; 10/10/2020 10:11 AM) ProAir HFA (108 (90 Base)MCG/ACT Aerosol Soln, Inhalation) Active. Doxycycline Hyclate (100MG  Capsule, Oral) Active. Vitamin D-3 (125 MCG(5000 UT) Tablet, Oral) Active. Medications Reconciled    Vitals [name] [place] RMA; 10/10/2020 10:12 AM) 10/10/2020 10:11 AM Weight: 190.25 lb Height: 64in Body Surface Area: 1.91 m Body Mass Index: 32.66 kg/m  Temp.: 97.28F  Pulse: 86 (Regular)  P.OX: 98% (Room air) BP: 136/84(Sitting, Left Arm, Standard)        Physical Exam (Itai Barbian A. Sebastin Perlmutter MD; 10/10/2020 12:43 PM)  Breast Note: Right breast incision clean dry and intact.    Assessment & Plan ([name] A. Eliany Mccarter MD; 10/10/2020 12:43 PM)  POST-OPERATIVE STATE [phone]) Impression: Scheduled for right breast reexcision of posterior margin and placement of port for chemotherapy. Pt requires port placement for chemotherapy. Risk include bleeding, infection, pneumothorax, hemothorax, mediastinal injury, nerve injury , blood vessel injury, stroke, blood clots, death, migration. embolization and need for additional procedures. Pt agrees to proceed. Risk of lumpectomy include bleeding, infection, seroma, more surgery, use of seed/wire, wound care, cosmetic deformity and the need for other treatments, death ,  blood clots, death. Pt agrees to proceed.  Current Plans Pt Education - [facility] [facility]

## 2020-10-10 NOTE — H&P (Signed)
Monica [name] Appointment: 10/10/2020 10:00 AM Location: [facility] Patient #: [id number] DOB: November 07, 1971 Single / Language: Monica [name] / Race: Black or African American Female The patient is a 49 year old female.   Allergies Monica [name], [place]; 10/10/2020 10:10 AM) No Known Drug Allergies [08/22/2020]: Allergies Reconciled  Medication History Monica [name], [place]; 10/10/2020 10:11 AM) ProAir HFA (108 (90 Base)MCG/ACT Aerosol Soln, Inhalation) Active. Doxycycline Hyclate (100MG  Capsule, Oral) Active. Vitamin D-3 (125 MCG(5000 UT) Tablet, Oral) Active. Medications Reconciled    Vitals Monica [name] RMA; 10/10/2020 10:12 AM) 10/10/2020 10:11 AM Weight: 190.25 lb Height: 64in Body Surface Area: 1.91 m Body Mass Index: 32.66 kg/m  Temp.: 97.8F  Pulse: 86 (Regular)  P.OX: 98% (Room air) BP: 136/84(Sitting, Left Arm, Standard)        Physical Exam (Monica [name] A. Monica Womac MD; 10/10/2020 10:56 AM)  Breast Note: Right breast incision clean dry and intact.    Assessment & Plan (Monica [name] A. Monica Daponte MD; 10/10/2020 10:56 AM)  POST-OPERATIVE STATE [phone]) Impression: Scheduled for right breast reexcision of posterior margin and placement of port for chemotherapy. Pt requires port placement for chemotherapy. Risk include bleeding, infection, pneumothorax, hemothorax, mediastinal injury, nerve injury , blood vessel injury, strke, blood clots, death, migration. embolization and need for additional procedures. Pt agrees to proceed. Risk of lumpectomy include bleeding, infection, seroma, more surgery, use of seed/wire, wound care, cosmetic deformity and the need for other treatments, death , blood clots, death. Pt agrees to proceed.

## 2020-10-10 NOTE — Progress Notes (Signed)

## 2020-10-13 ENCOUNTER — Other Ambulatory Visit (HOSPITAL_COMMUNITY)
Admission: RE | Admit: 2020-10-13 | Discharge: 2020-10-13 | Disposition: A | Discharge status: Home / Self Care | Payer: Medicaid Other | Source: Ambulatory Visit | Attending: Surgery | Admitting: Surgery

## 2020-10-13 DIAGNOSIS — Z01812 Encounter for preprocedural laboratory examination: Secondary | ICD-10-CM | POA: Diagnosis present

## 2020-10-13 DIAGNOSIS — Z20822 Contact with and (suspected) exposure to covid-19: Secondary | ICD-10-CM | POA: Insufficient documentation

## 2020-10-13 LAB — SARS CORONAVIRUS 2 (TAT 6-24 HRS): SARS Coronavirus 2: NEGATIVE

## 2020-10-15 NOTE — Anesthesia Preprocedure Evaluation (Addendum)
Anesthesia Evaluation  Patient identified by MRN, date of birth, ID band Patient awake    Reviewed: Allergy & Precautions, NPO status , Patient's Chart, lab work & pertinent test results  Airway Mallampati: II  TM Distance: >3 FB Neck ROM: Full    Dental no notable dental hx. (+) Partial Upper   Pulmonary asthma , Current Smoker and Patient abstained from smoking.,    Pulmonary exam normal breath sounds clear to auscultation       Cardiovascular Exercise Tolerance: Good hypertension, Normal cardiovascular exam Rhythm:Regular Rate:Normal     Neuro/Psych negative neurological ROS  negative psych ROS   GI/Hepatic negative GI ROS, Neg liver ROS,   Endo/Other  negative endocrine ROS  Renal/GU      Musculoskeletal negative musculoskeletal ROS (+)   Abdominal (+) + obese,   Peds  Hematology negative hematology ROS (+)   Anesthesia Other Findings   Reproductive/Obstetrics                            Anesthesia Physical Anesthesia Plan  ASA: II  Anesthesia Plan: General   Post-op Pain Management:    Induction: Intravenous  PONV Risk Score and Plan: 3 and Treatment may vary due to age or medical condition, Ondansetron, Dexamethasone and Midazolam  Airway Management Planned: LMA  Additional Equipment: None  Intra-op Plan:   Post-operative Plan:   Informed Consent: I have reviewed the patients History and Physical, chart, labs and discussed the procedure including the risks, benefits and alternatives for the proposed anesthesia with the patient or authorized representative who has indicated his/her understanding and acceptance.     Dental advisory given  Plan Discussed with: CRNA and Anesthesiologist  Anesthesia Plan Comments:        Anesthesia Quick Evaluation

## 2020-10-16 ENCOUNTER — Ambulatory Visit (HOSPITAL_BASED_OUTPATIENT_CLINIC_OR_DEPARTMENT_OTHER): Payer: Medicaid Other | Admitting: Anesthesiology

## 2020-10-16 ENCOUNTER — Encounter (HOSPITAL_BASED_OUTPATIENT_CLINIC_OR_DEPARTMENT_OTHER): Payer: Self-pay | Admitting: Surgery

## 2020-10-16 ENCOUNTER — Ambulatory Visit (HOSPITAL_COMMUNITY): Payer: Medicaid Other

## 2020-10-16 ENCOUNTER — Ambulatory Visit (HOSPITAL_BASED_OUTPATIENT_CLINIC_OR_DEPARTMENT_OTHER)
Admission: RE | Admit: 2020-10-16 | Discharge: 2020-10-16 | Disposition: A | Discharge status: Home / Self Care | Payer: Medicaid Other | Attending: Surgery | Admitting: Surgery

## 2020-10-16 ENCOUNTER — Encounter (HOSPITAL_BASED_OUTPATIENT_CLINIC_OR_DEPARTMENT_OTHER)
Admission: RE | Disposition: A | Discharge status: Home / Self Care | Payer: Self-pay | Source: Home / Self Care | Attending: Surgery

## 2020-10-16 DIAGNOSIS — D0501 Lobular carcinoma in situ of right breast: Secondary | ICD-10-CM | POA: Diagnosis not present

## 2020-10-16 DIAGNOSIS — Z7951 Long term (current) use of inhaled steroids: Secondary | ICD-10-CM | POA: Insufficient documentation

## 2020-10-16 DIAGNOSIS — Z79899 Other long term (current) drug therapy: Secondary | ICD-10-CM | POA: Diagnosis not present

## 2020-10-16 DIAGNOSIS — Z95828 Presence of other vascular implants and grafts: Secondary | ICD-10-CM

## 2020-10-16 DIAGNOSIS — C50911 Malignant neoplasm of unspecified site of right female breast: Secondary | ICD-10-CM | POA: Diagnosis present

## 2020-10-16 HISTORY — PX: RE-EXCISION OF BREAST LUMPECTOMY: SHX6048

## 2020-10-16 HISTORY — PX: PORTACATH PLACEMENT: SHX2246

## 2020-10-16 IMAGING — CR DG CHEST 1V PORT
1 series · 1 of 1 positions shown · non-contrast
Comparison: [DATE]

CLINICAL DATA: Port-A-Cath placement

EXAM:
PORTABLE CHEST 1 VIEW

[chest ap]
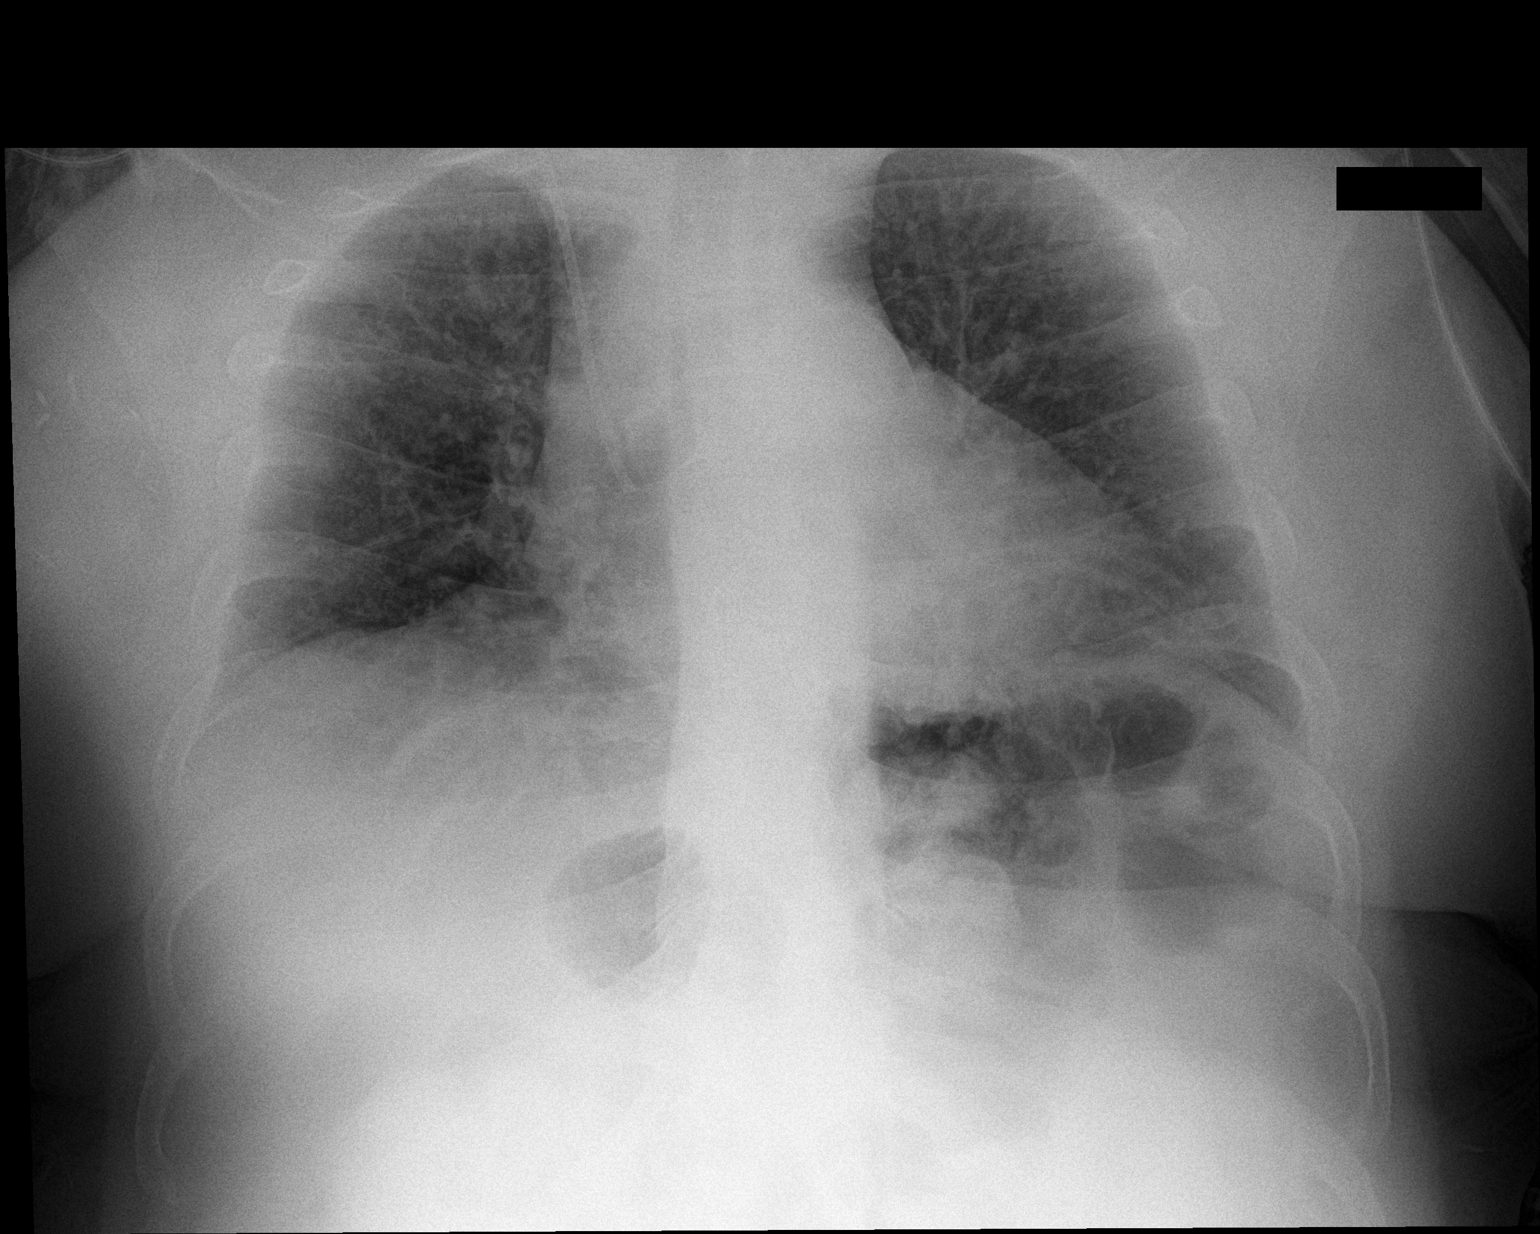

[1 of 1 positions shown; findings below may reference images not displayed]

FINDINGS: Central catheter tip is at the cavoatrial junction. No pneumothorax.
There is no edema or airspace opacity. Heart is borderline enlarged
with mild pulmonary venous hypertension. No adenopathy. No bone
lesions.
IMPRESSION: Central catheter tip at cavoatrial junction. No edema or airspace
opacity. Borderline cardiomegaly with mild pulmonary vascular
congestion.

## 2020-10-16 SURGERY — EXCISION, LESION, BREAST
Anesthesia: General | Site: Chest | Laterality: Right

## 2020-10-16 MED ORDER — PHENYLEPHRINE 40 MCG/ML (10ML) SYRINGE FOR IV PUSH (FOR BLOOD PRESSURE SUPPORT)
PREFILLED_SYRINGE | INTRAVENOUS | Status: AC
  Filled 2020-10-16: qty 10

## 2020-10-16 MED ORDER — LIDOCAINE 2% (20 MG/ML) 5 ML SYRINGE
INTRAMUSCULAR | Status: DC | PRN
  Administered 2020-10-16: 100 mg via INTRAVENOUS

## 2020-10-16 MED ORDER — CEFAZOLIN SODIUM-DEXTROSE 2-4 GM/100ML-% IV SOLN
2.0000 g | INTRAVENOUS | Status: AC
  Administered 2020-10-16: 2 g via INTRAVENOUS

## 2020-10-16 MED ORDER — ACETAMINOPHEN 500 MG PO TABS
1000.0000 mg | ORAL_TABLET | ORAL | Status: AC
  Administered 2020-10-16: 1000 mg via ORAL

## 2020-10-16 MED ORDER — GABAPENTIN 300 MG PO CAPS
300.0000 mg | ORAL_CAPSULE | ORAL | Status: AC
  Administered 2020-10-16: 300 mg via ORAL

## 2020-10-16 MED ORDER — PROPOFOL 10 MG/ML IV BOLUS
INTRAVENOUS | Status: AC
  Filled 2020-10-16: qty 20

## 2020-10-16 MED ORDER — ONDANSETRON HCL 4 MG/2ML IJ SOLN: 4.0000 mg | Freq: Once | INTRAMUSCULAR | Status: DC | PRN

## 2020-10-16 MED ORDER — HEPARIN SOD (PORK) LOCK FLUSH 100 UNIT/ML IV SOLN
INTRAVENOUS | Status: DC | PRN
  Administered 2020-10-16: 500 [IU] via INTRAVENOUS

## 2020-10-16 MED ORDER — GABAPENTIN 300 MG PO CAPS
ORAL_CAPSULE | ORAL | Status: AC
  Filled 2020-10-16: qty 1

## 2020-10-16 MED ORDER — CHLORHEXIDINE GLUCONATE CLOTH 2 % EX PADS: 6.0000 | MEDICATED_PAD | Freq: Once | CUTANEOUS | Status: DC

## 2020-10-16 MED ORDER — EPHEDRINE SULFATE-NACL 50-0.9 MG/10ML-% IV SOSY
PREFILLED_SYRINGE | INTRAVENOUS | Status: DC | PRN
  Administered 2020-10-16: 5 mg via INTRAVENOUS
  Administered 2020-10-16: 10 mg via INTRAVENOUS

## 2020-10-16 MED ORDER — CEFAZOLIN SODIUM-DEXTROSE 2-4 GM/100ML-% IV SOLN
INTRAVENOUS | Status: AC
  Filled 2020-10-16: qty 100

## 2020-10-16 MED ORDER — HYDROCODONE-ACETAMINOPHEN 5-325 MG PO TABS: 1.0000 | ORAL_TABLET | Freq: Four times a day (QID) | ORAL | 0 refills | Status: DC | PRN

## 2020-10-16 MED ORDER — CELECOXIB 200 MG PO CAPS
200.0000 mg | ORAL_CAPSULE | ORAL | Status: AC
  Administered 2020-10-16: 200 mg via ORAL

## 2020-10-16 MED ORDER — PROPOFOL 10 MG/ML IV BOLUS
INTRAVENOUS | Status: DC | PRN
  Administered 2020-10-16: 150 mg via INTRAVENOUS

## 2020-10-16 MED ORDER — CELECOXIB 200 MG PO CAPS
ORAL_CAPSULE | ORAL | Status: AC
  Filled 2020-10-16: qty 1

## 2020-10-16 MED ORDER — DEXAMETHASONE SODIUM PHOSPHATE 10 MG/ML IJ SOLN
INTRAMUSCULAR | Status: DC | PRN
  Administered 2020-10-16: 4 mg via INTRAVENOUS

## 2020-10-16 MED ORDER — PHENYLEPHRINE 40 MCG/ML (10ML) SYRINGE FOR IV PUSH (FOR BLOOD PRESSURE SUPPORT)
PREFILLED_SYRINGE | INTRAVENOUS | Status: DC | PRN
  Administered 2020-10-16 (×5): 120 ug via INTRAVENOUS
  Administered 2020-10-16: 160 ug via INTRAVENOUS

## 2020-10-16 MED ORDER — FENTANYL CITRATE (PF) 100 MCG/2ML IJ SOLN
INTRAMUSCULAR | Status: AC
  Filled 2020-10-16: qty 2

## 2020-10-16 MED ORDER — HEPARIN (PORCINE) IN NACL 2-0.9 UNITS/ML
INTRAMUSCULAR | Status: AC | PRN
  Administered 2020-10-16: 1 via INTRAVENOUS

## 2020-10-16 MED ORDER — ONDANSETRON HCL 4 MG/2ML IJ SOLN
INTRAMUSCULAR | Status: AC
  Filled 2020-10-16: qty 2

## 2020-10-16 MED ORDER — ACETAMINOPHEN 10 MG/ML IV SOLN: 1000.0000 mg | Freq: Once | INTRAVENOUS | Status: DC | PRN

## 2020-10-16 MED ORDER — MEPERIDINE HCL 25 MG/ML IJ SOLN: 6.2500 mg | INTRAMUSCULAR | Status: DC | PRN

## 2020-10-16 MED ORDER — MIDAZOLAM HCL 2 MG/2ML IJ SOLN
INTRAMUSCULAR | Status: AC
  Filled 2020-10-16: qty 2

## 2020-10-16 MED ORDER — CEFAZOLIN SODIUM-DEXTROSE 2-4 GM/100ML-% IV SOLN: 2.0000 g | INTRAVENOUS | Status: DC

## 2020-10-16 MED ORDER — HYDROMORPHONE HCL 1 MG/ML IJ SOLN
0.2500 mg | INTRAMUSCULAR | Status: DC | PRN
  Administered 2020-10-16: 0.5 mg via INTRAVENOUS

## 2020-10-16 MED ORDER — ONDANSETRON HCL 4 MG/2ML IJ SOLN
INTRAMUSCULAR | Status: DC | PRN
  Administered 2020-10-16: 4 mg via INTRAVENOUS

## 2020-10-16 MED ORDER — ACETAMINOPHEN 500 MG PO TABS
ORAL_TABLET | ORAL | Status: AC
  Filled 2020-10-16: qty 2

## 2020-10-16 MED ORDER — LACTATED RINGERS IV SOLN: INTRAVENOUS | Status: DC

## 2020-10-16 MED ORDER — HYDROMORPHONE HCL 1 MG/ML IJ SOLN
INTRAMUSCULAR | Status: AC
  Filled 2020-10-16: qty 0.5

## 2020-10-16 MED ORDER — DEXAMETHASONE SODIUM PHOSPHATE 10 MG/ML IJ SOLN
INTRAMUSCULAR | Status: AC
  Filled 2020-10-16: qty 1

## 2020-10-16 MED ORDER — HYDROCODONE-ACETAMINOPHEN 7.5-325 MG PO TABS: 1.0000 | ORAL_TABLET | Freq: Once | ORAL | Status: DC | PRN

## 2020-10-16 MED ORDER — FENTANYL CITRATE (PF) 100 MCG/2ML IJ SOLN
INTRAMUSCULAR | Status: DC | PRN
  Administered 2020-10-16: 25 ug via INTRAVENOUS
  Administered 2020-10-16: 75 ug via INTRAVENOUS

## 2020-10-16 MED ORDER — BUPIVACAINE HCL 0.25 % IJ SOLN
INTRAMUSCULAR | Status: DC | PRN
  Administered 2020-10-16: 30 mL

## 2020-10-16 SURGICAL SUPPLY — 53 items
ADH SKN CLS APL DERMABOND .7 (GAUZE/BANDAGES/DRESSINGS) ×4
APL PRP STRL LF DISP 70% ISPRP (MISCELLANEOUS) ×4
APPLIER CLIP 9.375 MED OPEN (MISCELLANEOUS) ×4
APR CLP MED 9.3 20 MLT OPN (MISCELLANEOUS) ×2
BAG DECANTER FOR FLEXI CONT (MISCELLANEOUS) ×4 IMPLANT
BINDER BREAST XLRG (GAUZE/BANDAGES/DRESSINGS) ×4 IMPLANT
BLADE SURG 11 STRL SS (BLADE) ×4 IMPLANT
BLADE SURG 15 STRL LF DISP TIS (BLADE) ×2 IMPLANT
BLADE SURG 15 STRL SS (BLADE) ×4
CANISTER SUCT 1200ML W/VALVE (MISCELLANEOUS) ×4 IMPLANT
CHLORAPREP W/TINT 26 (MISCELLANEOUS) ×8 IMPLANT
CLIP APPLIE 9.375 MED OPEN (MISCELLANEOUS) ×2 IMPLANT
COVER BACK TABLE 60X90IN (DRAPES) ×4 IMPLANT
COVER MAYO STAND STRL (DRAPES) ×4 IMPLANT
COVER PROBE 5X48 (MISCELLANEOUS) ×4
DERMABOND ADVANCED (GAUZE/BANDAGES/DRESSINGS) ×4
DERMABOND ADVANCED .7 DNX12 (GAUZE/BANDAGES/DRESSINGS) ×4 IMPLANT
DRAPE C-ARM 42X72 X-RAY (DRAPES) ×4 IMPLANT
DRAPE LAPAROSCOPIC ABDOMINAL (DRAPES) ×4 IMPLANT
DRAPE LAPAROTOMY 100X72 PEDS (DRAPES) ×4 IMPLANT
DRAPE UTILITY XL STRL (DRAPES) ×8 IMPLANT
ELECT COATED BLADE 2.86 ST (ELECTRODE) ×4 IMPLANT
ELECT REM PT RETURN 9FT ADLT (ELECTROSURGICAL) ×4
ELECTRODE REM PT RTRN 9FT ADLT (ELECTROSURGICAL) ×2 IMPLANT
GLOVE BIO SURGEON STRL SZ 6.5 (GLOVE) ×3 IMPLANT
GLOVE BIO SURGEON STRL SZ7.5 (GLOVE) ×4 IMPLANT
GLOVE BIO SURGEONS STRL SZ 6.5 (GLOVE) ×1
GLOVE BIOGEL PI IND STRL 7.5 (GLOVE) ×2 IMPLANT
GLOVE BIOGEL PI IND STRL 8 (GLOVE) ×4 IMPLANT
GLOVE BIOGEL PI INDICATOR 7.5 (GLOVE) ×2
GLOVE BIOGEL PI INDICATOR 8 (GLOVE) ×4
GLOVE ECLIPSE 8.0 STRL XLNG CF (GLOVE) ×8 IMPLANT
GOWN STRL REUS W/ TWL LRG LVL3 (GOWN DISPOSABLE) ×8 IMPLANT
GOWN STRL REUS W/TWL LRG LVL3 (GOWN DISPOSABLE) ×16
HEMOSTAT ARISTA ABSORB 3G PWDR (HEMOSTASIS) IMPLANT
KIT CVR 48X5XPRB PLUP LF (MISCELLANEOUS) ×2 IMPLANT
KIT MARKER MARGIN INK (KITS) ×4 IMPLANT
KIT PORT POWER 8FR ISP CVUE (Port) ×4 IMPLANT
NEEDLE HYPO 25X1 1.5 SAFETY (NEEDLE) ×4 IMPLANT
NS IRRIG 1000ML POUR BTL (IV SOLUTION) ×4 IMPLANT
PACK BASIN DAY SURGERY FS (CUSTOM PROCEDURE TRAY) ×4 IMPLANT
PENCIL SMOKE EVACUATOR (MISCELLANEOUS) ×4 IMPLANT
SLEEVE SCD COMPRESS KNEE MED (MISCELLANEOUS) ×4 IMPLANT
SPONGE LAP 4X18 RFD (DISPOSABLE) ×8 IMPLANT
SUT MON AB 4-0 PC3 18 (SUTURE) ×8 IMPLANT
SUT PROLENE 2 0 SH DA (SUTURE) ×4 IMPLANT
SUT VICRYL 3-0 CR8 SH (SUTURE) ×4 IMPLANT
SYR 5ML LUER SLIP (SYRINGE) ×4 IMPLANT
SYR CONTROL 10ML LL (SYRINGE) ×4 IMPLANT
TOWEL GREEN STERILE FF (TOWEL DISPOSABLE) ×8 IMPLANT
TUBE CONNECTING 20'X1/4 (TUBING) ×1
TUBE CONNECTING 20X1/4 (TUBING) ×3 IMPLANT
YANKAUER SUCT BULB TIP NO VENT (SUCTIONS) ×4 IMPLANT

## 2020-10-16 NOTE — Transfer of Care (Signed)
Immediate Anesthesia Transfer of Care Note  Patient: Monica [name]  Procedure(s) Performed: RE-EXCISION OF RIGHT BREAST LUMPECTOMY (Right Breast) INSERTION PORT-A-CATH WITH ULTRASOUND GUIDANCE (Right Chest)  Patient Location: PACU  Anesthesia Type:General  Level of Consciousness: awake, alert  and oriented  Airway & Oxygen Therapy: Patient Spontanous Breathing and Patient connected to face mask oxygen  Post-op Assessment: Report given to RN, Post -op Vital signs reviewed and stable and Patient moving all extremities X 4  Post vital signs: Reviewed and stable  Last Vitals:  Vitals Value Taken Time  BP 123/80 10/16/20 1002  Temp    Pulse 98 10/16/20 1004  Resp 21 10/16/20 1004  SpO2 100 % 10/16/20 1004  Vitals shown include unvalidated device data.  Last Pain:  Vitals:   10/16/20 0810  TempSrc: Oral  PainSc: 0-No pain         Complications: No complications documented.

## 2020-10-16 NOTE — Anesthesia Postprocedure Evaluation (Signed)
Anesthesia Post Note  Patient: Monica [name]  Procedure(s) Performed: RE-EXCISION OF RIGHT BREAST LUMPECTOMY (Right Breast) INSERTION PORT-A-CATH WITH ULTRASOUND GUIDANCE (Right Chest)     Patient location during evaluation: PACU Anesthesia Type: General Level of consciousness: awake and alert Pain management: pain level controlled Vital Signs Assessment: post-procedure vital signs reviewed and stable Respiratory status: spontaneous breathing, nonlabored ventilation, respiratory function stable and patient connected to nasal cannula oxygen Cardiovascular status: blood pressure returned to baseline and stable Postop Assessment: no apparent nausea or vomiting Anesthetic complications: no   No complications documented.  Last Vitals:  Vitals:   10/16/20 1005 10/16/20 1100  BP: 123/80 128/76  Pulse: 98 86  Resp: (!) 21 16  Temp: (!) 36.2 C 36.6 C  SpO2: 100% 99%    Last Pain:  Vitals:   10/16/20 1100  TempSrc:   PainSc: 2                  Barnet Glasgow

## 2020-10-16 NOTE — Anesthesia Procedure Notes (Signed)
Procedure Name: LMA Insertion Date/Time: 10/16/2020 8:36 AM Performed by: Niel Hummer, CRNA Pre-anesthesia Checklist: Patient identified, Emergency Drugs available, Suction available and Patient being monitored Patient Re-evaluated:Patient Re-evaluated prior to induction Oxygen Delivery Method: Circle system utilized Preoxygenation: Pre-oxygenation with 100% oxygen Induction Type: IV induction LMA: LMA inserted LMA Size: 4.0 Number of attempts: 1 Dental Injury: Teeth and Oropharynx as per pre-operative assessment

## 2020-10-16 NOTE — Interval H&P Note (Signed)
History and Physical Interval Note:  10/16/2020 8:15 AM  Monica [name]  has presented today for surgery, with the diagnosis of RIGHT BREAST CANCER.  The various methods of treatment have been discussed with the patient and family. After consideration of risks, benefits and other options for treatment, the patient has consented to  Procedure(s): RE-EXCISION OF RIGHT BREAST LUMPECTOMY (Right) INSERTION PORT-A-CATH WITH ULTRASOUND GUIDANCE (N/A) as a surgical intervention.  The patient's history has been reviewed, patient examined, no change in status, stable for surgery.  I have reviewed the patient's chart and labs.  Questions were answered to the patient's satisfaction.     [place]

## 2020-10-16 NOTE — Op Note (Signed)
Preoperative diagnosis: Right breast cancer and PAC needed for chemotherapy  Postoperative diagnosis: Same  Procedure: Portacath Placement with C arm and ultrasound guidance and right breast reexcision lumpectomy  Surgeon: Turner Daniels, MD, FACS  Anesthesia: General and 0.25 % marcaine with epinephrine  Clinical History and Indications: Patient presents for reexcision right breast lumpectomy and placement of Port-A-Cath for chemotherapy.  The patient is getting ready to begin chemotherapy for her cancer. She  needs a Port-A-Cath for venous access. Risk of bleeding, infection,  Collapse lung,  Death,  DVT,  Organ injury,  Mediastinal injury,  Injury to heart,  Injury to blood vessels,  Nerves,  Migration of catheter,  Embolization of catheter and the need for more surgery.The procedure has been discussed with the patient. Alternatives to surgery have been discussed with the patient.  Risks of surgery include bleeding,  Infection,  Seroma formation, death,  and the need for further surgery.   The patient understands and wishes to proceed.  Description of Procedure: I have seen the patient in the holding area and confirmed the plans for the procedure as noted above. I reviewed the risks and complications again and the patient has no further questions. She wishes to proceed.   The patient was then taken to the operating room. After satisfactory general  anesthesia had been obtained the upper chest and lower neck were prepped and draped as a sterile field. The timeout was done.  The Port-A-Cath was done first.  The right internal jugular vein  was entered under U/S guidance  and the guidewire threaded into the superior vena cava right atrial area under fluoroscopic guidance. An incision was then made on the anterior chest wall and a subcutaneous pocket fashioned for the port reservoir.  The port tubing was then brought through a subcutaneous tunnel from the port site to the guidewire site.  The  port and catheter were attached, locked  and flushed. The catheter was measured and cut to appropriate length.The dilator and peel-away sheath were then advanced over the guidewire while monitoring this with fluoroscopy. The guidewire and dilator were removed and the tubing threaded to approximately 20 cm. The peel-away sheath was then removed. The catheter aspirated and flushed easily. Using fluoroscopy the tip was in the superior vena cava right atrial junction area. It aspirated and flushed easily. That aspirated and flushed easily.  The reservoir was secured to the fascia with 1 sutures of 2-0 Prolene. A final check with fluoroscopy was done to make sure we had no kinks and good positioning of the tip of the catheter. Everything appeared to be okay. The catheter was aspirated, flushed with dilute heparin and then concentrated aqueous heparin.  The incision was then closed with interrupted 3-0 Vicryl, and 4-0 Monocryl subcuticular with Dermabond on the skin.  The patient was reprepped and redraped.  The right breast was prepped in a sterile fashion.  A second timeout was performed.  The right breast incision was open from previous lumpectomy.  The lumpectomy cavity was identified.  The entire cavity was excised given small size.  This was sent as 1 specimen after orientation with ink to help orient pathology.  Cavities made hemostatic.  Irrigation was used.  Local anesthetic was infiltrated throughout the cavity.  Cavity was then closed with 3-0 Vicryl and 4-0 Monocryl.  All counts were found to be correct.  Dermabond was applied.  Breast binder placed.  The patient was awoke extubated taken recovery in satisfactory condition.  There were no  operative complications. Estimated blood loss was minimal. All counts were correct. The patient tolerated the procedure well.  Turner Daniels, MD, FACS

## 2020-10-16 NOTE — Discharge Instructions (Signed)
PORT-A-CATH: POST OP INSTRUCTIONS  Always review your discharge instruction sheet given to you by the facility where your surgery was performed.   1. A prescription for pain medication may be given to you upon discharge. Take your pain medication as prescribed, if needed. If narcotic pain medicine is not needed, then you make take acetaminophen (Tylenol) or ibuprofen (Advil) as needed.  2. Take your usually prescribed medications unless otherwise directed. 3. If you need a refill on your pain medication, please contact our office. All narcotic pain medicine now requires a paper prescription.  Phoned in and fax refills are no longer allowed by law.  Prescriptions will not be filled after 5 pm or on weekends.  4. You should follow a light diet for the remainder of the day after your procedure. 5. Most patients will experience some mild swelling and/or bruising in the area of the incision. It may take several days to resolve. 6. It is common to experience some constipation if taking pain medication after surgery. Increasing fluid intake and taking a stool softener (such as Colace) will usually help or prevent this problem from occurring. A mild laxative (Milk of Magnesia or Miralax) should be taken according to package directions if there are no bowel movements after 48 hours.  7. Unless discharge instructions indicate otherwise, you may remove your bandages 48 hours after surgery, and you may shower at that time. You may have steri-strips (small white skin tapes) in place directly over the incision.  These strips should be left on the skin for 7-10 days.  If your surgeon used Dermabond (skin glue) on the incision, you may shower in 24 hours.  The glue will flake off over the next 2-3 weeks.  8. If your port is left accessed at the end of surgery (needle left in port), the dressing cannot get wet and should only by changed by a healthcare professional. When the port is no longer accessed (when the  needle has been removed), follow step 7.   9. ACTIVITIES:  Limit activity involving your arms for the next 72 hours. Do no strenuous exercise or activity for 1 week. You may drive when you are no longer taking prescription pain medication, you can comfortably wear a seatbelt, and you can maneuver your car. 10.You may need to see your doctor in the office for a follow-up appointment.  Please       check with your doctor.  11.When you receive a new Port-a-Cath, you will get a product guide and        ID card.  Please keep them in case you need them.  WHEN TO CALL YOUR DOCTOR [phone]): 1. Fever over 101.0 2. Chills 3. Continued bleeding from incision 4. Increased redness and tenderness at the site 5. Shortness of breath, difficulty breathing   The clinic staff is available to answer your questions during regular business hours. Please dont hesitate to call and ask to speak to one of the nurses or medical assistants for clinical concerns. If you have a medical emergency, go to the nearest emergency room or call 911.  A surgeon from [facility] is always on call at the hospital.     For further information, please visit [email or web address]    Post Anesthesia Home Care Instructions  Activity: Get plenty of rest for the remainder of the day. A responsible individual must stay with you for 24 hours following the procedure.  For the next 24 hours, DO NOT: -Drive a  car -Paediatric nurse -Drink alcoholic beverages -Take any medication unless instructed by your physician -Make any legal decisions or sign important papers.  Meals: Start with liquid foods such as gelatin or soup. Progress to regular foods as tolerated. Avoid greasy, spicy, heavy foods. If nausea and/or vomiting occur, drink only clear liquids until the nausea and/or vomiting subsides. Call your physician if vomiting continues.  Special Instructions/Symptoms: Your throat may feel dry or sore  from the anesthesia or the breathing tube placed in your throat during surgery. If this causes discomfort, gargle with warm salt water. The discomfort should disappear within 24 hours.  If you had a scopolamine patch placed behind your ear for the management of post- operative nausea and/or vomiting:  1. The medication in the patch is effective for 72 hours, after which it should be removed.  Wrap patch in a tissue and discard in the trash. Wash hands thoroughly with soap and water. 2. You may remove the patch earlier than 72 hours if you experience unpleasant side effects which may include dry mouth, dizziness or visual disturbances. 3. Avoid touching the patch. Wash your hands with soap and water after contact with the patch.  No tylenol until after 2pm if needed.  No ibuprofen until after 4pm if needed.

## 2020-10-17 ENCOUNTER — Encounter (HOSPITAL_BASED_OUTPATIENT_CLINIC_OR_DEPARTMENT_OTHER): Payer: Self-pay | Admitting: Surgery

## 2020-10-20 LAB — SURGICAL PATHOLOGY

## 2020-10-21 ENCOUNTER — Encounter: Payer: Self-pay | Admitting: *Deleted

## 2020-10-24 ENCOUNTER — Encounter: Payer: Self-pay | Admitting: Licensed Clinical Social Worker

## 2020-10-24 NOTE — Progress Notes (Signed)
[place] CSW Progress Note  Clinical Education officer, museum contacted patient by phone to provide ongoing support post-surgical re-excision. Patient is feeling happy today as she received the news that she had clear margins after this surgery. She is a little sore where her port was put in, but otherwise feeling good.  The issue with her apartment has also been resolved and rent reduced since she has no income.   Patient expressed that she still has fears that the cancer may move to her other breast or elsewhere in her body, but is trying to stay focused on the good news and that she can now move forward with starting chemo treatments.  CSW completed [employer] form with patient over the phone.  Next visit: 11/29 during infusion   Christeen Douglas , LCSW

## 2020-10-27 NOTE — Progress Notes (Signed)
Pharmacist Chemotherapy Monitoring - Initial Assessment    Anticipated start date: 11/03/20  Regimen:   Are orders appropriate based on the patients diagnosis, regimen, and cycle? Yes  Does the plan date match the patients scheduled date? Yes  Is the sequencing of drugs appropriate? Yes  Are the premedications appropriate for the patients regimen? Yes  Prior Authorization for treatment is: Approved o If applicable, is the correct biosimilar selected based on the patient's insurance? yes  Organ Function and Labs:  Are dose adjustments needed based on the patient's renal function, hepatic function, or hematologic function? No  Are appropriate labs ordered prior to the start of patient's treatment? Yes  Other organ system assessment, if indicated: trastuzumab: Echo/ MUGA  The following baseline labs, if indicated, have been ordered: N/A  Dose Assessment:  Are the drug doses appropriate? Yes  Are the following correct: o Drug concentrations Yes o IV fluid compatible with drug Yes o Administration routes Yes o Timing of therapy Yes  If applicable, does the patient have documented access for treatment and/or plans for port-a-cath placement? yes  If applicable, have lifetime cumulative doses been properly documented and assessed? not applicable Lifetime Dose Tracking  No doses have been documented on this patient for the following tracked chemicals: Doxorubicin, Epirubicin, Idarubicin, Daunorubicin, Mitoxantrone, Bleomycin, Oxaliplatin, Carboplatin, Liposomal Doxorubicin  o   Toxicity Monitoring/Prevention:  The patient has the following take home antiemetics prescribed: N/A  The patient has the following take home medications prescribed: N/A  Medication allergies and previous infusion related reactions, if applicable, have been reviewed and addressed. Yes  The patient's current medication list has been assessed for drug-drug interactions with their chemotherapy  regimen. no significant drug-drug interactions were identified on review.  Order Review:  Are the treatment plan orders signed? Yes  Is the patient scheduled to see a provider prior to their treatment? Yes  I verify that I have reviewed each item in the above checklist and answered each question accordingly.  Monica [name] [facility] 10/27/2020 1:57 PM

## 2020-10-31 MED FILL — Dexamethasone Sodium Phosphate Inj 100 MG/10ML: INTRAMUSCULAR | Qty: 1 | Status: AC

## 2020-11-02 NOTE — Assessment & Plan Note (Signed)
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  ER 80%, PR 60%, Ki-67 15%, HER-2 equivocal by IHC positive for fish 10/11/20: Re-excision: Neg  Recommendation: 1. Adjuvant chemotherapy with Taxol Herceptin followed by Herceptin maintenance for 1 year 2. Adjuvant radiation therapy followed by 3. Adjuvant antiestrogen therapy with anastrozole (patient is postmenopausal) and anti-HER-2 therapy with neratinib -------------------------------------------------------------------------------------------------------------------------------- Current treatment: cycle 1 Taxol-herceptin Echo: EF 60-65% Labs reviewed Chemo consent obtained. Chemo education Completed RTC in 1 week for cycle 2

## 2020-11-02 NOTE — Progress Notes (Signed)
Patient Care Team: Raiford Simmonds., PA-C as PCP - General  DIAGNOSIS:    ICD-10-CM   1. Primary malignant neoplasm of upper outer quadrant of female breast, right ([place])  C50.411     SUMMARY OF ONCOLOGIC HISTORY: Oncology History  Primary malignant neoplasm of upper outer quadrant of female breast, right ([place])  08/12/2020 Initial Diagnosis   Screening mammogram on 07/07/20 showed an asymmetry. Diagnostic mammogram and [place] on 07/29/20 showed a 0.8cm mass at the 10 o'clock position and no right axillary adenopathy. Biopsy on 08/12/20 showed invasive and in situ mammary carcinoma, grade 2, HER-2 equivocal by IHC, positive by FISH, ER+ 80%, PR+ 60%, Ki67 15%.   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     Genetic Testing   Negative genetic testing: no pathogenic variants detected in [facility] Multi-Cancer Panel.  Variants of uncertain significance detected in ATM (c.131A>G (p.Asp44Gly) and c.4658A>C (p.Glu1553Ala)) and WRN (c.2825+6G>T (intronic)).  The report date is October 02, 2020.   The Multi-Cancer Panel offered by [facility] includes sequencing and/or deletion duplication testing of the following 85 genes: AIP, ALK, APC, ATM, AXIN2,BAP1,  BARD1, BLM, BMPR1A, BRCA1,  BRCA2, BRIP1, CASR, CDC73, CDH1, CDK4, CDKN1B, CDKN1C, CDKN2A (p14ARF), CDKN2A (p16INK4a), CEBPA, CHEK2, CTNNA1, DICER1, DIS3L2, EGFR (c.2369C>T, p.Thr790Met variant only), EPCAM (Deletion/duplication testing only), FH, FLCN, GATA2, GPC3, GREM1 (Promoter region deletion/duplication testing only), HOXB13 (c.251G>A, p.Gly84Glu), HRAS, KIT, MAX, MEN1, MET, MITF (c.952G>A, p.Glu318Lys variant only), MLH1, MSH2, MSH3, MSH6, MUTYH, NBN, NF1, NF2, NTHL1, PALB2, PDGFRA, PHOX2B, PMS2, POLD1, POLE, POT1, PRKAR1A, PTCH1, PTEN, RAD50, RAD51C, RAD51D, RB1, RECQL4, RET, RNF43, RUNX1, SDHAF2, SDHA (sequence changes only), SDHB, SDHC, SDHD, SMAD4, SMARCA4, SMARCB1, SMARCE1, STK11, SUFU, TERC, TERT, TMEM127, TP53, TSC1, TSC2, VHL, WRN and WT1.      CHIEF COMPLIANT: Cycle 1 Taxol Herceptin  INTERVAL HISTORY: Monica [name] a 49y.o. with above-mentioned history of right breast cancer who underwent a right lumpectomy followed by re-excision of a positive margin, and is currently on adjuvant chemotherapy with Taxol Herceptin. Echo on 09/26/20 showed an ejection fraction of 60-65%. She presents to the clinic today for cycle 1.   ALLERGIES:  has No Known Allergies.  MEDICATIONS:  Current Outpatient Medications  Medication Sig Dispense Refill  . albuterol (VENTOLIN HFA) 108 (90 Base) MCG/ACT inhaler Inhale into the lungs every 6 (six) hours as needed for wheezing or shortness of breath.    .Marland Kitchenalbuterol (VENTOLIN HFA) 108 (90 Base) MCG/ACT inhaler Inhale 2 puffs into the lungs every 4 (four) hours as needed for wheezing or shortness of breath. 8 g 3  . aspirin EC 81 MG tablet Take 81 mg by mouth daily.     . Black Currant Seed Oil 500 MG CAPS Take 1 capsule by mouth  daily.    . Cholecalciferol (VITAMIN D3) 125 MCG (5000 UT) TABS Take 1 tablet by mouth daily.    Marland Kitchen HYDROcodone-acetaminophen (NORCO/VICODIN) 5-325 MG tablet Take 1 tablet by mouth every 6 (six) hours as needed for moderate pain. 15 tablet 0  .  ibuprofen (ADVIL) 800 MG tablet Take 1 tablet (800 mg total) by mouth every 8 (eight) hours as needed. 30 tablet 0  . lisinopril (ZESTRIL) 10 MG tablet Take 10 mg by mouth daily.     . montelukast (SINGULAIR) 10 MG tablet Take 10 mg by mouth daily.    . Multiple Vitamins-Minerals (AIRBORNE PO) Take 1 tablet by mouth daily.     No current facility-administered medications for this visit.   Facility-Administered Medications Ordered in Other Visits  Medication Dose Route Frequency Provider Last Rate Last Admin  . sodium chloride flush (NS) 0.9 % injection 10 mL  10 mL Intravenous PRN Nicholas Lose, MD   10 mL at 11/03/20 0827    PHYSICAL EXAMINATION: ECOG PERFORMANCE STATUS: 1 - Symptomatic but completely ambulatory  There were no vitals filed for this visit. There were no vitals filed for this visit.  LABORATORY DATA:  I have reviewed the data as listed CMP Latest Ref Rng & Units 09/08/2020  Glucose 70 - 99 mg/dL 82  BUN 6 - 20 mg/dL 8  Creatinine 0.44 - 1.00 mg/dL 0.78  Sodium 135 - 145 mmol/L 137  Potassium 3.5 - 5.1 mmol/L 4.7  Chloride 98 - 111 mmol/L 104  CO2 22 - 32 mmol/L 27  Calcium 8.9 - 10.3 mg/dL 9.2  Total Protein 6.5 - 8.1 g/dL 6.9  Total Bilirubin 0.3 - 1.2 mg/dL 0.4  Alkaline Phos 38 - 126 U/L 90  AST 15 - 41 U/L 20  ALT 0 - 44 U/L 13    Lab Results  Component Value Date   WBC 7.7 09/08/2020   HGB 13.2 09/08/2020   HCT 39.2 09/08/2020   MCV 80.5 09/08/2020   PLT 268 09/08/2020   NEUTROABS 3.9 09/08/2020    ASSESSMENT & PLAN:  Primary malignant neoplasm of upper outer quadrant of female breast, right ([place]) 09/10/2020:Right lumpectomy ([facility]): invasive lobular carcinoma, 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  ER 80%, PR 60%, Ki-67 15%, HER-2 equivocal by IHC positive for fish 10/11/20: Re-excision: Neg  Recommendation: 1. Adjuvant chemotherapy with Taxol Herceptin followed by Herceptin maintenance  for 1 year 2. Adjuvant radiation therapy followed by 3. Adjuvant antiestrogen therapy with anastrozole (patient is postmenopausal) and anti-HER-2 therapy with neratinib -------------------------------------------------------------------------------------------------------------------------------- Current treatment: cycle 1 Taxol-herceptin Echo: EF 60-65% Labs reviewed Chemo consent obtained. Chemo education Completed RTC in 1 week for cycle 2   No orders of the defined types were placed in this encounter.  The patient has a good understanding of the overall plan. she agrees with it. she will call with any problems that may develop before the next visit here.  Total time spent: 30 mins including face to face time and time spent for planning, charting and coordination of care  Nicholas Lose, MD 11/03/2020  I, Cloyde Reams Dorshimer, am acting as scribe for Dr. Nicholas Lose.  I have reviewed the above documentation for accuracy and completeness, and I agree with the above.

## 2020-11-03 ENCOUNTER — Inpatient Hospital Stay: Payer: Medicaid Other

## 2020-11-03 ENCOUNTER — Inpatient Hospital Stay (HOSPITAL_BASED_OUTPATIENT_CLINIC_OR_DEPARTMENT_OTHER): Payer: Medicaid Other | Admitting: Hematology and Oncology

## 2020-11-03 ENCOUNTER — Other Ambulatory Visit: Payer: Self-pay

## 2020-11-03 ENCOUNTER — Encounter: Payer: Self-pay | Admitting: *Deleted

## 2020-11-03 ENCOUNTER — Inpatient Hospital Stay: Payer: Medicaid Other | Admitting: Licensed Clinical Social Worker

## 2020-11-03 ENCOUNTER — Inpatient Hospital Stay: Payer: Medicaid Other | Attending: Hematology and Oncology

## 2020-11-03 ENCOUNTER — Encounter: Payer: Self-pay | Admitting: Hematology and Oncology

## 2020-11-03 VITALS — BP 133/89 | HR 81 | Temp 98.6°F | Resp 17

## 2020-11-03 VITALS — BP 131/86 | HR 78 | Temp 97.3°F | Resp 18 | Ht 63.0 in | Wt 191.0 lb

## 2020-11-03 DIAGNOSIS — Z5112 Encounter for antineoplastic immunotherapy: Secondary | ICD-10-CM | POA: Insufficient documentation

## 2020-11-03 DIAGNOSIS — Z79899 Other long term (current) drug therapy: Secondary | ICD-10-CM | POA: Insufficient documentation

## 2020-11-03 DIAGNOSIS — C50411 Malignant neoplasm of upper-outer quadrant of right female breast: Secondary | ICD-10-CM | POA: Diagnosis not present

## 2020-11-03 DIAGNOSIS — C773 Secondary and unspecified malignant neoplasm of axilla and upper limb lymph nodes: Secondary | ICD-10-CM | POA: Insufficient documentation

## 2020-11-03 DIAGNOSIS — Z95828 Presence of other vascular implants and grafts: Secondary | ICD-10-CM

## 2020-11-03 DIAGNOSIS — Z7982 Long term (current) use of aspirin: Secondary | ICD-10-CM | POA: Diagnosis not present

## 2020-11-03 DIAGNOSIS — Z5111 Encounter for antineoplastic chemotherapy: Secondary | ICD-10-CM | POA: Insufficient documentation

## 2020-11-03 LAB — CBC WITH DIFFERENTIAL (CANCER CENTER ONLY)
Abs Immature Granulocytes: 0.02 10*3/uL (ref 0.00–0.07)
Basophils Absolute: 0.1 10*3/uL (ref 0.0–0.1)
Basophils Relative: 1 %
Eosinophils Absolute: 0.2 10*3/uL (ref 0.0–0.5)
Eosinophils Relative: 2 %
HCT: 38.1 % (ref 36.0–46.0)
Hemoglobin: 13.1 g/dL (ref 12.0–15.0)
Immature Granulocytes: 0 %
Lymphocytes Relative: 34 %
Lymphs Abs: 2.8 10*3/uL (ref 0.7–4.0)
MCH: 27 pg (ref 26.0–34.0)
MCHC: 34.4 g/dL (ref 30.0–36.0)
MCV: 78.4 fL — ABNORMAL LOW (ref 80.0–100.0)
Monocytes Absolute: 0.7 10*3/uL (ref 0.1–1.0)
Monocytes Relative: 8 %
Neutro Abs: 4.5 10*3/uL (ref 1.7–7.7)
Neutrophils Relative %: 55 %
Platelet Count: 236 10*3/uL (ref 150–400)
RBC: 4.86 MIL/uL (ref 3.87–5.11)
RDW: 13.4 % (ref 11.5–15.5)
WBC Count: 8.2 10*3/uL (ref 4.0–10.5)
nRBC: 0 % (ref 0.0–0.2)

## 2020-11-03 LAB — CMP (CANCER CENTER ONLY)
ALT: 12 U/L (ref 0–44)
AST: 17 U/L (ref 15–41)
Albumin: 3.9 g/dL (ref 3.5–5.0)
Alkaline Phosphatase: 109 U/L (ref 38–126)
Anion gap: 7 (ref 5–15)
BUN: 10 mg/dL (ref 6–20)
CO2: 22 mmol/L (ref 22–32)
Calcium: 9.3 mg/dL (ref 8.9–10.3)
Chloride: 109 mmol/L (ref 98–111)
Creatinine: 0.8 mg/dL (ref 0.44–1.00)
GFR, Estimated: >60 mL/min (ref >60)
Glucose, Bld: 95 mg/dL (ref 70–99)
Potassium: 3.7 mmol/L (ref 3.5–5.1)
Sodium: 138 mmol/L (ref 135–145)
Total Bilirubin: 0.6 mg/dL (ref 0.3–1.2)
Total Protein: 7.5 g/dL (ref 6.5–8.1)

## 2020-11-03 MED ORDER — SODIUM CHLORIDE 0.9 % IV SOLN
10.0000 mg | Freq: Once | INTRAVENOUS | Status: AC
  Administered 2020-11-03: 10 mg via INTRAVENOUS
  Filled 2020-11-03: qty 10

## 2020-11-03 MED ORDER — ACETAMINOPHEN 325 MG PO TABS
ORAL_TABLET | ORAL | Status: AC
  Filled 2020-11-03: qty 2

## 2020-11-03 MED ORDER — TRASTUZUMAB-DKST CHEMO 150 MG IV SOLR
4.0000 mg/kg | Freq: Once | INTRAVENOUS | Status: AC
  Administered 2020-11-03: 336 mg via INTRAVENOUS
  Filled 2020-11-03: qty 16

## 2020-11-03 MED ORDER — LIDOCAINE-PRILOCAINE 2.5-2.5 % EX CREA: TOPICAL_CREAM | CUTANEOUS | 3 refills | Status: DC

## 2020-11-03 MED ORDER — SODIUM CHLORIDE 0.9 % IV SOLN
80.0000 mg/m2 | Freq: Once | INTRAVENOUS | Status: AC
  Administered 2020-11-03: 156 mg via INTRAVENOUS
  Filled 2020-11-03: qty 26

## 2020-11-03 MED ORDER — SODIUM CHLORIDE 0.9 % IV SOLN
Freq: Once | INTRAVENOUS | Status: AC
  Filled 2020-11-03: qty 250

## 2020-11-03 MED ORDER — PROCHLORPERAZINE MALEATE 10 MG PO TABS: 10.0000 mg | ORAL_TABLET | Freq: Four times a day (QID) | ORAL | 1 refills | Status: DC | PRN

## 2020-11-03 MED ORDER — SODIUM CHLORIDE 0.9% FLUSH
10.0000 mL | INTRAVENOUS | Status: DC | PRN
  Administered 2020-11-03: 10 mL
  Filled 2020-11-03: qty 10

## 2020-11-03 MED ORDER — FAMOTIDINE IN NACL 20-0.9 MG/50ML-% IV SOLN
INTRAVENOUS | Status: AC
  Filled 2020-11-03: qty 50

## 2020-11-03 MED ORDER — FAMOTIDINE IN NACL 20-0.9 MG/50ML-% IV SOLN
20.0000 mg | Freq: Once | INTRAVENOUS | Status: AC
  Administered 2020-11-03: 20 mg via INTRAVENOUS

## 2020-11-03 MED ORDER — DIPHENHYDRAMINE HCL 50 MG/ML IJ SOLN
INTRAMUSCULAR | Status: AC
  Filled 2020-11-03: qty 1

## 2020-11-03 MED ORDER — HEPARIN SOD (PORK) LOCK FLUSH 100 UNIT/ML IV SOLN
500.0000 [IU] | Freq: Once | INTRAVENOUS | Status: AC | PRN
  Administered 2020-11-03: 500 [IU]
  Filled 2020-11-03: qty 5

## 2020-11-03 MED ORDER — ACETAMINOPHEN 325 MG PO TABS
650.0000 mg | ORAL_TABLET | Freq: Once | ORAL | Status: AC
  Administered 2020-11-03: 650 mg via ORAL

## 2020-11-03 MED ORDER — ONDANSETRON HCL 8 MG PO TABS: 8.0000 mg | ORAL_TABLET | Freq: Two times a day (BID) | ORAL | 1 refills | Status: DC | PRN

## 2020-11-03 MED ORDER — DIPHENHYDRAMINE HCL 50 MG/ML IJ SOLN
12.5000 mg | Freq: Once | INTRAMUSCULAR | Status: AC
  Administered 2020-11-03: 12.5 mg via INTRAVENOUS

## 2020-11-03 MED ORDER — SODIUM CHLORIDE 0.9% FLUSH
10.0000 mL | INTRAVENOUS | Status: DC | PRN
  Administered 2020-11-03: 10 mL via INTRAVENOUS
  Filled 2020-11-03: qty 10

## 2020-11-03 NOTE — Patient Instructions (Signed)

## 2020-11-03 NOTE — Progress Notes (Signed)
[place] CSW Progress Note  Holiday representative met with patient in infusion to provide ongoing support. Patient was nervous this morning and last night as today is her first chemotherapy treatment and she is still nervous thinking about if the cancer will spread to her other breast. CSW provided empathic listening, normalized feelings, and discussed ways to help cope with uncertainty and live in the present. Patient was able to express a number of helpful/ positive thoughts today which is improved since prior visits.  Patient was able to go to [employer] and obtain a couple of wigs. She already cut her hair short in preparation of losing it during treatment.  CSW provided 3rd installment of [place] today.  Follow-up: CSW will continue to provide emotional support as patient goes through treatment. Next check-in in 1-2 weeks.   Christeen Douglas LCSW

## 2020-11-03 NOTE — Patient Instructions (Signed)
[facility] Discharge Instructions for Patients Receiving Chemotherapy  Today you received the following chemotherapy agents Trastuzumab and Taxol  To help prevent nausea and vomiting after your treatment, we encourage you to take your nausea medication as directed.    If you develop nausea and vomiting that is not controlled by your nausea medication, call the clinic.   BELOW ARE SYMPTOMS THAT SHOULD BE REPORTED IMMEDIATELY:  *FEVER GREATER THAN 100.5 F  *CHILLS WITH OR WITHOUT FEVER  NAUSEA AND VOMITING THAT IS NOT CONTROLLED WITH YOUR NAUSEA MEDICATION  *UNUSUAL SHORTNESS OF BREATH  *UNUSUAL BRUISING OR BLEEDING  TENDERNESS IN MOUTH AND THROAT WITH OR WITHOUT PRESENCE OF ULCERS  *URINARY PROBLEMS  *BOWEL PROBLEMS  UNUSUAL RASH Items with * indicate a potential emergency and should be followed up as soon as possible.  Feel free to call the clinic should you have any questions or concerns. The clinic phone number is [phone].  Please show the [facility] at check-in to the Emergency Department and triage nurse.  Trastuzumab injection for infusion What is this medicine? TRASTUZUMAB (tras TOO zoo mab) is a monoclonal antibody. It is used to treat breast cancer and stomach cancer. This medicine may be used for other purposes; ask your health care provider or pharmacist if you have questions. COMMON BRAND NAME(S): Herceptin, Galvin Proffer, Trazimera What should I tell my health care provider before I take this medicine? They need to know if you have any of these conditions:  heart disease  heart failure  lung or breathing disease, like asthma  an unusual or allergic reaction to trastuzumab, benzyl alcohol, or other medications, foods, dyes, or preservatives  pregnant or trying to get pregnant  breast-feeding How should I use this medicine? This drug is given as an infusion into a vein. It is administered in a hospital  or clinic by a specially trained health care professional. Talk to your pediatrician regarding the use of this medicine in children. This medicine is not approved for use in children. Overdosage: If you think you have taken too much of this medicine contact a poison control center or emergency room at once. NOTE: This medicine is only for you. Do not share this medicine with others. What if I miss a dose? It is important not to miss a dose. Call your doctor or health care professional if you are unable to keep an appointment. What may interact with this medicine? This medicine may interact with the following medications:  certain types of chemotherapy, such as daunorubicin, doxorubicin, epirubicin, and idarubicin This list may not describe all possible interactions. Give your health care provider a list of all the medicines, herbs, non-prescription drugs, or dietary supplements you use. Also tell them if you smoke, drink alcohol, or use illegal drugs. Some items may interact with your medicine. What should I watch for while using this medicine? Visit your doctor for checks on your progress. Report any side effects. Continue your course of treatment even though you feel ill unless your doctor tells you to stop. Call your doctor or health care professional for advice if you get a fever, chills or sore throat, or other symptoms of a cold or flu. Do not treat yourself. Try to avoid being around people who are sick. You may experience fever, chills and shaking during your first infusion. These effects are usually mild and can be treated with other medicines. Report any side effects during the infusion to your health care professional.  Fever and chills usually do not happen with later infusions. Do not become pregnant while taking this medicine or for 7 months after stopping it. Women should inform their doctor if they wish to become pregnant or think they might be pregnant. Women of child-bearing potential  will need to have a negative pregnancy test before starting this medicine. There is a potential for serious side effects to an unborn child. Talk to your health care professional or pharmacist for more information. Do not breast-feed an infant while taking this medicine or for 7 months after stopping it. Women must use effective birth control with this medicine. What side effects may I notice from receiving this medicine? Side effects that you should report to your doctor or health care professional as soon as possible:  allergic reactions like skin rash, itching or hives, swelling of the face, lips, or tongue  chest pain or palpitations  cough  dizziness  feeling faint or lightheaded, falls  fever  general ill feeling or flu-like symptoms  signs of worsening heart failure like breathing problems; swelling in your legs and feet  unusually weak or tired Side effects that usually do not require medical attention (report to your doctor or health care professional if they continue or are bothersome):  bone pain  changes in taste  diarrhea  joint pain  nausea/vomiting  weight loss This list may not describe all possible side effects. Call your doctor for medical advice about side effects. You may report side effects to FDA at 1-800-FDA-1088. Where should I keep my medicine? This drug is given in a hospital or clinic and will not be stored at home. NOTE: This sheet is a summary. It may not cover all possible information. If you have questions about this medicine, talk to your doctor, pharmacist, or health care provider.  2020 Elsevier/Gold Standard (2016-11-16 14:37:52)  Paclitaxel injection What is this medicine? PACLITAXEL (PAK li TAX el) is a chemotherapy drug. It targets fast dividing cells, like cancer cells, and causes these cells to die. This medicine is used to treat ovarian cancer, breast cancer, lung cancer, Kaposi's sarcoma, and other cancers. This medicine may be  used for other purposes; ask your health care provider or pharmacist if you have questions. COMMON BRAND NAME(S): Onxol, Taxol What should I tell my health care provider before I take this medicine? They need to know if you have any of these conditions:  history of irregular heartbeat  liver disease  low blood counts, like low white cell, platelet, or red cell counts  lung or breathing disease, like asthma  tingling of the fingers or toes, or other nerve disorder  an unusual or allergic reaction to paclitaxel, alcohol, polyoxyethylated castor oil, other chemotherapy, other medicines, foods, dyes, or preservatives  pregnant or trying to get pregnant  breast-feeding How should I use this medicine? This drug is given as an infusion into a vein. It is administered in a hospital or clinic by a specially trained health care professional. Talk to your pediatrician regarding the use of this medicine in children. Special care may be needed. Overdosage: If you think you have taken too much of this medicine contact a poison control center or emergency room at once. NOTE: This medicine is only for you. Do not share this medicine with others. What if I miss a dose? It is important not to miss your dose. Call your doctor or health care professional if you are unable to keep an appointment. What may interact with this  medicine? Do not take this medicine with any of the following medications:  disulfiram  metronidazole This medicine may also interact with the following medications:  antiviral medicines for hepatitis, HIV or AIDS  certain antibiotics like erythromycin and clarithromycin  certain medicines for fungal infections like ketoconazole and itraconazole  certain medicines for seizures like carbamazepine, phenobarbital, phenytoin  gemfibrozil  nefazodone  rifampin  St. John's wort This list may not describe all possible interactions. Give your health care provider a list of  all the medicines, herbs, non-prescription drugs, or dietary supplements you use. Also tell them if you smoke, drink alcohol, or use illegal drugs. Some items may interact with your medicine. What should I watch for while using this medicine? Your condition will be monitored carefully while you are receiving this medicine. You will need important blood work done while you are taking this medicine. This medicine can cause serious allergic reactions. To reduce your risk you will need to take other medicine(s) before treatment with this medicine. If you experience allergic reactions like skin rash, itching or hives, swelling of the face, lips, or tongue, tell your doctor or health care professional right away. In some cases, you may be given additional medicines to help with side effects. Follow all directions for their use. This drug may make you feel generally unwell. This is not uncommon, as chemotherapy can affect healthy cells as well as cancer cells. Report any side effects. Continue your course of treatment even though you feel ill unless your doctor tells you to stop. Call your doctor or health care professional for advice if you get a fever, chills or sore throat, or other symptoms of a cold or flu. Do not treat yourself. This drug decreases your body's ability to fight infections. Try to avoid being around people who are sick. This medicine may increase your risk to bruise or bleed. Call your doctor or health care professional if you notice any unusual bleeding. Be careful brushing and flossing your teeth or using a toothpick because you may get an infection or bleed more easily. If you have any dental work done, tell your dentist you are receiving this medicine. Avoid taking products that contain aspirin, acetaminophen, ibuprofen, naproxen, or ketoprofen unless instructed by your doctor. These medicines may hide a fever. Do not become pregnant while taking this medicine. Women should inform their  doctor if they wish to become pregnant or think they might be pregnant. There is a potential for serious side effects to an unborn child. Talk to your health care professional or pharmacist for more information. Do not breast-feed an infant while taking this medicine. Men are advised not to father a child while receiving this medicine. This product may contain alcohol. Ask your pharmacist or healthcare provider if this medicine contains alcohol. Be sure to tell all healthcare providers you are taking this medicine. Certain medicines, like metronidazole and disulfiram, can cause an unpleasant reaction when taken with alcohol. The reaction includes flushing, headache, nausea, vomiting, sweating, and increased thirst. The reaction can last from 30 minutes to several hours. What side effects may I notice from receiving this medicine? Side effects that you should report to your doctor or health care professional as soon as possible:  allergic reactions like skin rash, itching or hives, swelling of the face, lips, or tongue  breathing problems  changes in vision  fast, irregular heartbeat  high or low blood pressure  mouth sores  pain, tingling, numbness in the hands or feet  signs of decreased platelets or bleeding - bruising, pinpoint red spots on the skin, black, tarry stools, blood in the urine  signs of decreased red blood cells - unusually weak or tired, feeling faint or lightheaded, falls  signs of infection - fever or chills, cough, sore throat, pain or difficulty passing urine  signs and symptoms of liver injury like dark yellow or brown urine; general ill feeling or flu-like symptoms; light-colored stools; loss of appetite; nausea; right upper belly pain; unusually weak or tired; yellowing of the eyes or skin  swelling of the ankles, feet, hands  unusually slow heartbeat Side effects that usually do not require medical attention (report to your doctor or health care professional if  they continue or are bothersome):  diarrhea  hair loss  loss of appetite  muscle or joint pain  nausea, vomiting  pain, redness, or irritation at site where injected  tiredness This list may not describe all possible side effects. Call your doctor for medical advice about side effects. You may report side effects to FDA at 1-800-FDA-1088. Where should I keep my medicine? This drug is given in a hospital or clinic and will not be stored at home. NOTE: This sheet is a summary. It may not cover all possible information. If you have questions about this medicine, talk to your doctor, pharmacist, or health care provider.  2020 Elsevier/Gold Standard (2017-07-26 13:14:55)

## 2020-11-03 NOTE — Progress Notes (Signed)
Met with patient in lobby to introduce myself as Arboriculturist and to offer available resources.  Discussed one-time $1000 Radio broadcast assistant to assist with personal expenses while going through treatment.  Advised what is needed to apply and will go over grant on 12/12/19.  Offered [employer] application for patients whom live in [place] and she states she is already affiliated with them.   Gave her my card for any additional financial questions or concerns.

## 2020-11-03 NOTE — Progress Notes (Signed)
Pt stable at discharge.  

## 2020-11-04 ENCOUNTER — Telehealth: Payer: Self-pay | Admitting: Hematology and Oncology

## 2020-11-04 ENCOUNTER — Telehealth: Payer: Self-pay | Admitting: *Deleted

## 2020-11-04 NOTE — Telephone Encounter (Signed)
Called pt to discuss how she did with her treatment yesterday.  She reports gas, cramps, & diarrhea x 5 last eve.  She called on-call RN & instructed to take immodium which she did & diarrhea ceased. She had slight nausea this am & took nausea med which has helped.  Encouraged to cont nausea med, immodium & oral fluids/gatoraide as needed.  She knows how to reach [place] if needed.

## 2020-11-04 NOTE — Telephone Encounter (Signed)
-----   Message from Veverly Fells, RN sent at 11/03/2020  3:32 PM EST ----- Regarding: GUDENA-First time follow-up Pt of MD Gudena received 1st Trastuzumab/Taxol today. Pt tolerated well with no complaints.

## 2020-11-04 NOTE — Telephone Encounter (Signed)
No 11/29 los, no changes made to pt schedule  

## 2020-11-06 ENCOUNTER — Telehealth: Payer: Self-pay | Admitting: Licensed Clinical Social Worker

## 2020-11-06 NOTE — Telephone Encounter (Signed)
Monica [name] CSW Progress Note  Clinical Education officer, museum contacted patient by phone to ensure that patient is aware of update from [employer] about extra documentation requested. Patient will work on gathering documents this weekend.  Patient also noted that she is doing fairly well after her first chemo. She is having a sore/ sensitive mouth though which is making it very difficult to eat. She is also more fatigued but is still getting up and walking to help her energy levels. CSW notifying medical team of concern with mouth.    Christeen Douglas , LCSW

## 2020-11-07 NOTE — Telephone Encounter (Signed)
This came over last night, can you please help address it and call the pt.  Thanks!

## 2020-11-09 NOTE — Progress Notes (Signed)
Patient Care Team: Raiford Simmonds., PA-C as PCP - General  DIAGNOSIS:    ICD-10-CM   1. Primary malignant neoplasm of upper outer quadrant of female breast, right ([place])  C50.411     SUMMARY OF ONCOLOGIC HISTORY: Oncology History  Primary malignant neoplasm of upper outer quadrant of female breast, right ([place])  08/12/2020 Initial Diagnosis   Screening mammogram on 07/07/20 showed an asymmetry. Diagnostic mammogram and [place] on 07/29/20 showed a 0.8cm mass at the 10 o'clock position and no right axillary adenopathy. Biopsy on 08/12/20 showed invasive and in situ mammary carcinoma, grade 2, HER-2 equivocal by IHC, positive by FISH, ER+ 80%, PR+ 60%, Ki67 15%.   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     Genetic Testing   Negative genetic testing: no pathogenic variants detected in [facility] Multi-Cancer Panel.  Variants of uncertain significance detected in ATM (c.131A>G (p.Asp44Gly) and c.4658A>C (p.Glu1553Ala)) and WRN (c.2825+6G>T (intronic)).  The report date is October 02, 2020.   The Multi-Cancer Panel offered by [facility] includes sequencing and/or deletion duplication testing of the following  85 genes: AIP, ALK, APC, ATM, AXIN2,BAP1,  BARD1, BLM, BMPR1A, BRCA1, BRCA2, BRIP1, CASR, CDC73, CDH1, CDK4, CDKN1B, CDKN1C, CDKN2A (p14ARF), CDKN2A (p16INK4a), CEBPA, CHEK2, CTNNA1, DICER1, DIS3L2, EGFR (c.2369C>T, p.Thr790Met variant only), EPCAM (Deletion/duplication testing only), FH, FLCN, GATA2, GPC3, GREM1 (Promoter region deletion/duplication testing only), HOXB13 (c.251G>A, p.Gly84Glu), HRAS, KIT, MAX, MEN1, MET, MITF (c.952G>A, p.Glu318Lys variant only), MLH1, MSH2, MSH3, MSH6, MUTYH, NBN, NF1, NF2, NTHL1, PALB2, PDGFRA, PHOX2B, PMS2, POLD1, POLE, POT1, PRKAR1A, PTCH1, PTEN, RAD50, RAD51C, RAD51D, RB1, RECQL4, RET, RNF43, RUNX1, SDHAF2, SDHA (sequence changes only), SDHB, SDHC, SDHD, SMAD4, SMARCA4, SMARCB1, SMARCE1, STK11, SUFU, TERC, TERT, TMEM127, TP53, TSC1, TSC2, VHL, WRN and WT1.      CHIEF COMPLIANT: Cycle 2 Taxol Herceptin  INTERVAL HISTORY: Monica Gildneris a 49y.o. with above-mentioned history of right breast cancer who underwent a right lumpectomy followed by re-excision of a positive margin, and is currently on adjuvant chemotherapy with Taxol Herceptin. She presents to the clinic today for a toxicity check and cycle 2.   ALLERGIES:  has No Known Allergies.  MEDICATIONS:  Current Outpatient Medications  Medication Sig Dispense Refill  . albuterol (VENTOLIN HFA) 108 (90 Base) MCG/ACT inhaler Inhale into the lungs every 6 (six) hours as needed for wheezing or shortness of breath.    .Marland Kitchenalbuterol (VENTOLIN HFA) 108 (90 Base) MCG/ACT inhaler Inhale 2 puffs into the lungs every 4 (four) hours as needed for wheezing or shortness of breath. 8 g 3  . Cholecalciferol (VITAMIN D3) 125 MCG (5000 UT) TABS Take 1 tablet by mouth daily.    .Marland KitchenHYDROcodone-acetaminophen (NORCO/VICODIN) 5-325 MG tablet Take 1  tablet by mouth every 6 (six) hours as needed for moderate pain. 15 tablet 0  . ibuprofen (ADVIL) 800 MG tablet Take 1 tablet (800 mg total) by mouth every 8 (eight) hours as  needed. 30 tablet 0  . lidocaine-prilocaine (EMLA) cream Apply to affected area once 30 g 3  . lisinopril (ZESTRIL) 10 MG tablet Take 10 mg by mouth daily.     . montelukast (SINGULAIR) 10 MG tablet Take 10 mg by mouth daily.    . Multiple Vitamins-Minerals (AIRBORNE PO) Take 1 tablet by mouth daily.    . ondansetron (ZOFRAN) 8 MG tablet Take 1 tablet (8 mg total) by mouth 2 (two) times daily as needed (Nausea or vomiting). 30 tablet 1  . prochlorperazine (COMPAZINE) 10 MG tablet Take 1 tablet (10 mg total) by mouth every 6 (six) hours as needed (Nausea or vomiting). 30 tablet 1   No current facility-administered medications for this visit.    PHYSICAL EXAMINATION: ECOG PERFORMANCE STATUS: 1 - Symptomatic but completely ambulatory  Vitals:   11/10/20 0956  BP: 127/83  Pulse: 74  Resp: 18  Temp: (!) 97.4 F (36.3 C)  SpO2: 99%   Filed Weights   11/10/20 0956  Weight: 190 lb 3.2 oz (86.3 kg)    LABORATORY DATA:  I have reviewed the data as listed CMP Latest Ref Rng & Units 11/10/2020 11/03/2020 09/08/2020  Glucose 70 - 99 mg/dL 87 95 82  BUN 6 - 20 mg/dL 10 10 8   Creatinine 0.44 - 1.00 mg/dL 0.82 0.80 0.78  Sodium 135 - 145 mmol/L 140 138 137  Potassium 3.5 - 5.1 mmol/L 3.9 3.7 4.7  Chloride 98 - 111 mmol/L 109 109 104  CO2 22 - 32 mmol/L 24 22 27   Calcium 8.9 - 10.3 mg/dL 10.1 9.3 9.2  Total Protein 6.5 - 8.1 g/dL 7.4 7.5 6.9  Total Bilirubin 0.3 - 1.2 mg/dL 0.4 0.6 0.4  Alkaline Phos 38 - 126 U/L 99 109 90  AST 15 - 41 U/L 21 17 20   ALT 0 - 44 U/L 12 12 13     Lab Results  Component Value Date   WBC 6.1 11/10/2020   HGB 12.1 11/10/2020   HCT 35.0 (L) 11/10/2020   MCV 78.1 (L) 11/10/2020   PLT 227 11/10/2020   NEUTROABS 3.7 11/10/2020    ASSESSMENT & PLAN:  Primary malignant neoplasm of upper outer quadrant of female breast, right ([place]) 09/10/2020:Right lumpectomy ([facility]): invasive lobular carcinoma, 1.2cm, grade 2, involved posterior margin, 1/2 right axillary  lymph node positive for carcinoma.ER 80%, PR 60%, Ki-67 15%, HER-2 equivocal by IHC positive for fish 10/11/20: Re-excision: Neg  Recommendation: 1.Adjuvant chemotherapy with Taxol Herceptin followed by Herceptin maintenance for 1 year 2. Adjuvant radiation therapy followed by 3. Adjuvant antiestrogen therapywith anastrozole (patient is postmenopausal) and anti-HER-2 therapy with neratinib -------------------------------------------------------------------------------------------------------------------------------- Current treatment: cycle 2 Taxol-herceptin Echo: EF 60-65% Chemo toxicities: 1.  Mouth sores 2. diarrhea: When she took Imodium she got constipated 3.  Fatigue  I instructed her to eat more iron-containing foods because of the microcytosis. Return to clinic weekly for chemo and every other week for follow-up with me.  No orders of the defined types were placed in this encounter.  The patient has a good understanding of the overall plan. she agrees with it. she will call with any problems that may develop before the next visit here.  Total time spent: 30 mins including face to face time and time spent for planning, charting and  coordination of care  Nicholas Lose, MD 11/10/2020  Julious Oka Dorshimer, am acting as scribe for Dr. Nicholas Lose.  I have reviewed the above documentation for accuracy and completeness, and I agree with the above.

## 2020-11-10 ENCOUNTER — Encounter: Payer: Self-pay | Admitting: Licensed Clinical Social Worker

## 2020-11-10 ENCOUNTER — Inpatient Hospital Stay: Payer: Medicaid Other

## 2020-11-10 ENCOUNTER — Inpatient Hospital Stay: Payer: Medicaid Other | Attending: Hematology and Oncology

## 2020-11-10 ENCOUNTER — Inpatient Hospital Stay (HOSPITAL_BASED_OUTPATIENT_CLINIC_OR_DEPARTMENT_OTHER): Payer: Medicaid Other | Admitting: Hematology and Oncology

## 2020-11-10 ENCOUNTER — Encounter: Payer: Self-pay | Admitting: *Deleted

## 2020-11-10 ENCOUNTER — Encounter: Payer: Self-pay | Admitting: Hematology and Oncology

## 2020-11-10 ENCOUNTER — Other Ambulatory Visit: Payer: Self-pay

## 2020-11-10 DIAGNOSIS — C50411 Malignant neoplasm of upper-outer quadrant of right female breast: Secondary | ICD-10-CM | POA: Insufficient documentation

## 2020-11-10 DIAGNOSIS — Z5111 Encounter for antineoplastic chemotherapy: Secondary | ICD-10-CM | POA: Diagnosis present

## 2020-11-10 DIAGNOSIS — Z79899 Other long term (current) drug therapy: Secondary | ICD-10-CM | POA: Insufficient documentation

## 2020-11-10 DIAGNOSIS — Z170 Estrogen receptor positive status [ER+]: Secondary | ICD-10-CM | POA: Insufficient documentation

## 2020-11-10 DIAGNOSIS — Z5112 Encounter for antineoplastic immunotherapy: Secondary | ICD-10-CM | POA: Insufficient documentation

## 2020-11-10 LAB — CBC WITH DIFFERENTIAL (CANCER CENTER ONLY)
Abs Immature Granulocytes: 0.03 10*3/uL (ref 0.00–0.07)
Basophils Absolute: 0.1 10*3/uL (ref 0.0–0.1)
Basophils Relative: 1 %
Eosinophils Absolute: 0.1 10*3/uL (ref 0.0–0.5)
Eosinophils Relative: 2 %
HCT: 35 % — ABNORMAL LOW (ref 36.0–46.0)
Hemoglobin: 12.1 g/dL (ref 12.0–15.0)
Immature Granulocytes: 1 %
Lymphocytes Relative: 28 %
Lymphs Abs: 1.7 10*3/uL (ref 0.7–4.0)
MCH: 27 pg (ref 26.0–34.0)
MCHC: 34.6 g/dL (ref 30.0–36.0)
MCV: 78.1 fL — ABNORMAL LOW (ref 80.0–100.0)
Monocytes Absolute: 0.4 10*3/uL (ref 0.1–1.0)
Monocytes Relative: 7 %
Neutro Abs: 3.7 10*3/uL (ref 1.7–7.7)
Neutrophils Relative %: 61 %
Platelet Count: 227 10*3/uL (ref 150–400)
RBC: 4.48 MIL/uL (ref 3.87–5.11)
RDW: 13.4 % (ref 11.5–15.5)
WBC Count: 6.1 10*3/uL (ref 4.0–10.5)
nRBC: 0 % (ref 0.0–0.2)

## 2020-11-10 LAB — CMP (CANCER CENTER ONLY)
ALT: 12 U/L (ref 0–44)
AST: 21 U/L (ref 15–41)
Albumin: 3.9 g/dL (ref 3.5–5.0)
Alkaline Phosphatase: 99 U/L (ref 38–126)
Anion gap: 7 (ref 5–15)
BUN: 10 mg/dL (ref 6–20)
CO2: 24 mmol/L (ref 22–32)
Calcium: 10.1 mg/dL (ref 8.9–10.3)
Chloride: 109 mmol/L (ref 98–111)
Creatinine: 0.82 mg/dL (ref 0.44–1.00)
GFR, Estimated: >60 mL/min (ref >60)
Glucose, Bld: 87 mg/dL (ref 70–99)
Potassium: 3.9 mmol/L (ref 3.5–5.1)
Sodium: 140 mmol/L (ref 135–145)
Total Bilirubin: 0.4 mg/dL (ref 0.3–1.2)
Total Protein: 7.4 g/dL (ref 6.5–8.1)

## 2020-11-10 MED ORDER — ACETAMINOPHEN 325 MG PO TABS
ORAL_TABLET | ORAL | Status: AC
  Filled 2020-11-10: qty 2

## 2020-11-10 MED ORDER — SODIUM CHLORIDE 0.9 % IV SOLN
10.0000 mg | Freq: Once | INTRAVENOUS | Status: AC
  Administered 2020-11-10: 10 mg via INTRAVENOUS
  Filled 2020-11-10: qty 10

## 2020-11-10 MED ORDER — DIPHENHYDRAMINE HCL 50 MG/ML IJ SOLN
12.5000 mg | Freq: Once | INTRAMUSCULAR | Status: AC
  Administered 2020-11-10: 12.5 mg via INTRAVENOUS

## 2020-11-10 MED ORDER — ACETAMINOPHEN 325 MG PO TABS
650.0000 mg | ORAL_TABLET | Freq: Once | ORAL | Status: AC
  Administered 2020-11-10: 650 mg via ORAL

## 2020-11-10 MED ORDER — FAMOTIDINE IN NACL 20-0.9 MG/50ML-% IV SOLN
INTRAVENOUS | Status: AC
  Filled 2020-11-10: qty 50

## 2020-11-10 MED ORDER — DIPHENHYDRAMINE HCL 50 MG/ML IJ SOLN
INTRAMUSCULAR | Status: AC
  Filled 2020-11-10: qty 1

## 2020-11-10 MED ORDER — SODIUM CHLORIDE 0.9 % IV SOLN
80.0000 mg/m2 | Freq: Once | INTRAVENOUS | Status: AC
  Administered 2020-11-10: 156 mg via INTRAVENOUS
  Filled 2020-11-10: qty 26

## 2020-11-10 MED ORDER — FAMOTIDINE IN NACL 20-0.9 MG/50ML-% IV SOLN
20.0000 mg | Freq: Once | INTRAVENOUS | Status: AC
  Administered 2020-11-10: 20 mg via INTRAVENOUS

## 2020-11-10 MED ORDER — SODIUM CHLORIDE 0.9% FLUSH
10.0000 mL | INTRAVENOUS | Status: DC | PRN
  Administered 2020-11-10: 10 mL
  Filled 2020-11-10: qty 10

## 2020-11-10 MED ORDER — SODIUM CHLORIDE 0.9 % IV SOLN
Freq: Once | INTRAVENOUS | Status: AC
  Filled 2020-11-10: qty 250

## 2020-11-10 MED ORDER — LISINOPRIL 10 MG PO TABS
10.0000 mg | ORAL_TABLET | Freq: Every day | ORAL | Status: DC
Start: 2020-11-10 — End: 2023-11-17

## 2020-11-10 MED ORDER — TRASTUZUMAB-DKST CHEMO 150 MG IV SOLR
2.0000 mg/kg | Freq: Once | INTRAVENOUS | Status: AC
  Administered 2020-11-10: 168 mg via INTRAVENOUS
  Filled 2020-11-10: qty 8

## 2020-11-10 MED ORDER — HEPARIN SOD (PORK) LOCK FLUSH 100 UNIT/ML IV SOLN
500.0000 [IU] | Freq: Once | INTRAVENOUS | Status: AC | PRN
  Administered 2020-11-10: 500 [IU]
  Filled 2020-11-10: qty 5

## 2020-11-10 MED ORDER — SODIUM CHLORIDE 0.9% FLUSH
10.0000 mL | Freq: Once | INTRAVENOUS | Status: AC
  Administered 2020-11-10: 10 mL via INTRAVENOUS
  Filled 2020-11-10: qty 10

## 2020-11-10 NOTE — Progress Notes (Signed)
[facility] CSW Progress Note  Holiday representative met with patient in infusion for ongoing support. Patient reports that she is feeling better after getting question answered by surgeon and having suggestions from MD about mouth sores. She is still having some easier and harder days mentally but is walking and listening to her music when she has a lot on her mind. CSW gave information on [employer] program and [facility] for additional support.    Christeen Douglas , LCSW

## 2020-11-10 NOTE — Progress Notes (Signed)
Met with patient at registration to obtain income for grant.  Patient approved for one-time $1000 [employer] grant to assist with personal expenses while going through treatment.  Gave her a copy of the approval letter and expense sheet along with the Outpatient pharmacy information. Explained in detail expenses and how they are covered. She received a gas card today from grant.  She has my card for any additional financial questions or concerns.

## 2020-11-10 NOTE — Patient Instructions (Signed)

## 2020-11-10 NOTE — Patient Instructions (Signed)
[facility] Discharge Instructions for Patients Receiving Chemotherapy  Today you received the following chemotherapy agents Trastuzumab and Taxol  To help prevent nausea and vomiting after your treatment, we encourage you to take your nausea medication as directed.    If you develop nausea and vomiting that is not controlled by your nausea medication, call the clinic.   BELOW ARE SYMPTOMS THAT SHOULD BE REPORTED IMMEDIATELY:  *FEVER GREATER THAN 100.5 F  *CHILLS WITH OR WITHOUT FEVER  NAUSEA AND VOMITING THAT IS NOT CONTROLLED WITH YOUR NAUSEA MEDICATION  *UNUSUAL SHORTNESS OF BREATH  *UNUSUAL BRUISING OR BLEEDING  TENDERNESS IN MOUTH AND THROAT WITH OR WITHOUT PRESENCE OF ULCERS  *URINARY PROBLEMS  *BOWEL PROBLEMS  UNUSUAL RASH Items with * indicate a potential emergency and should be followed up as soon as possible.  Feel free to call the clinic should you have any questions or concerns. The clinic phone number is [phone].  Please show the [facility] at check-in to the Emergency Department and triage nurse.  Trastuzumab injection for infusion What is this medicine? TRASTUZUMAB (tras TOO zoo mab) is a monoclonal antibody. It is used to treat breast cancer and stomach cancer. This medicine may be used for other purposes; ask your health care provider or pharmacist if you have questions. COMMON BRAND NAME(S): Herceptin, Galvin Proffer, Trazimera What should I tell my health care provider before I take this medicine? They need to know if you have any of these conditions:  heart disease  heart failure  lung or breathing disease, like asthma  an unusual or allergic reaction to trastuzumab, benzyl alcohol, or other medications, foods, dyes, or preservatives  pregnant or trying to get pregnant  breast-feeding How should I use this medicine? This drug is given as an infusion into a vein. It is administered in a hospital  or clinic by a specially trained health care professional. Talk to your pediatrician regarding the use of this medicine in children. This medicine is not approved for use in children. Overdosage: If you think you have taken too much of this medicine contact a poison control center or emergency room at once. NOTE: This medicine is only for you. Do not share this medicine with others. What if I miss a dose? It is important not to miss a dose. Call your doctor or health care professional if you are unable to keep an appointment. What may interact with this medicine? This medicine may interact with the following medications:  certain types of chemotherapy, such as daunorubicin, doxorubicin, epirubicin, and idarubicin This list may not describe all possible interactions. Give your health care provider a list of all the medicines, herbs, non-prescription drugs, or dietary supplements you use. Also tell them if you smoke, drink alcohol, or use illegal drugs. Some items may interact with your medicine. What should I watch for while using this medicine? Visit your doctor for checks on your progress. Report any side effects. Continue your course of treatment even though you feel ill unless your doctor tells you to stop. Call your doctor or health care professional for advice if you get a fever, chills or sore throat, or other symptoms of a cold or flu. Do not treat yourself. Try to avoid being around people who are sick. You may experience fever, chills and shaking during your first infusion. These effects are usually mild and can be treated with other medicines. Report any side effects during the infusion to your health care professional.  Fever and chills usually do not happen with later infusions. Do not become pregnant while taking this medicine or for 7 months after stopping it. Women should inform their doctor if they wish to become pregnant or think they might be pregnant. Women of child-bearing potential  will need to have a negative pregnancy test before starting this medicine. There is a potential for serious side effects to an unborn child. Talk to your health care professional or pharmacist for more information. Do not breast-feed an infant while taking this medicine or for 7 months after stopping it. Women must use effective birth control with this medicine. What side effects may I notice from receiving this medicine? Side effects that you should report to your doctor or health care professional as soon as possible:  allergic reactions like skin rash, itching or hives, swelling of the face, lips, or tongue  chest pain or palpitations  cough  dizziness  feeling faint or lightheaded, falls  fever  general ill feeling or flu-like symptoms  signs of worsening heart failure like breathing problems; swelling in your legs and feet  unusually weak or tired Side effects that usually do not require medical attention (report to your doctor or health care professional if they continue or are bothersome):  bone pain  changes in taste  diarrhea  joint pain  nausea/vomiting  weight loss This list may not describe all possible side effects. Call your doctor for medical advice about side effects. You may report side effects to FDA at 1-800-FDA-1088. Where should I keep my medicine? This drug is given in a hospital or clinic and will not be stored at home. NOTE: This sheet is a summary. It may not cover all possible information. If you have questions about this medicine, talk to your doctor, pharmacist, or health care provider.  2020 Elsevier/Gold Standard (2016-11-16 14:37:52)  Paclitaxel injection What is this medicine? PACLITAXEL (PAK li TAX el) is a chemotherapy drug. It targets fast dividing cells, like cancer cells, and causes these cells to die. This medicine is used to treat ovarian cancer, breast cancer, lung cancer, Kaposi's sarcoma, and other cancers. This medicine may be  used for other purposes; ask your health care provider or pharmacist if you have questions. COMMON BRAND NAME(S): Onxol, Taxol What should I tell my health care provider before I take this medicine? They need to know if you have any of these conditions:  history of irregular heartbeat  liver disease  low blood counts, like low white cell, platelet, or red cell counts  lung or breathing disease, like asthma  tingling of the fingers or toes, or other nerve disorder  an unusual or allergic reaction to paclitaxel, alcohol, polyoxyethylated castor oil, other chemotherapy, other medicines, foods, dyes, or preservatives  pregnant or trying to get pregnant  breast-feeding How should I use this medicine? This drug is given as an infusion into a vein. It is administered in a hospital or clinic by a specially trained health care professional. Talk to your pediatrician regarding the use of this medicine in children. Special care may be needed. Overdosage: If you think you have taken too much of this medicine contact a poison control center or emergency room at once. NOTE: This medicine is only for you. Do not share this medicine with others. What if I miss a dose? It is important not to miss your dose. Call your doctor or health care professional if you are unable to keep an appointment. What may interact with this  medicine? Do not take this medicine with any of the following medications:  disulfiram  metronidazole This medicine may also interact with the following medications:  antiviral medicines for hepatitis, HIV or AIDS  certain antibiotics like erythromycin and clarithromycin  certain medicines for fungal infections like ketoconazole and itraconazole  certain medicines for seizures like carbamazepine, phenobarbital, phenytoin  gemfibrozil  nefazodone  rifampin  St. John's wort This list may not describe all possible interactions. Give your health care provider a list of  all the medicines, herbs, non-prescription drugs, or dietary supplements you use. Also tell them if you smoke, drink alcohol, or use illegal drugs. Some items may interact with your medicine. What should I watch for while using this medicine? Your condition will be monitored carefully while you are receiving this medicine. You will need important blood work done while you are taking this medicine. This medicine can cause serious allergic reactions. To reduce your risk you will need to take other medicine(s) before treatment with this medicine. If you experience allergic reactions like skin rash, itching or hives, swelling of the face, lips, or tongue, tell your doctor or health care professional right away. In some cases, you may be given additional medicines to help with side effects. Follow all directions for their use. This drug may make you feel generally unwell. This is not uncommon, as chemotherapy can affect healthy cells as well as cancer cells. Report any side effects. Continue your course of treatment even though you feel ill unless your doctor tells you to stop. Call your doctor or health care professional for advice if you get a fever, chills or sore throat, or other symptoms of a cold or flu. Do not treat yourself. This drug decreases your body's ability to fight infections. Try to avoid being around people who are sick. This medicine may increase your risk to bruise or bleed. Call your doctor or health care professional if you notice any unusual bleeding. Be careful brushing and flossing your teeth or using a toothpick because you may get an infection or bleed more easily. If you have any dental work done, tell your dentist you are receiving this medicine. Avoid taking products that contain aspirin, acetaminophen, ibuprofen, naproxen, or ketoprofen unless instructed by your doctor. These medicines may hide a fever. Do not become pregnant while taking this medicine. Women should inform their  doctor if they wish to become pregnant or think they might be pregnant. There is a potential for serious side effects to an unborn child. Talk to your health care professional or pharmacist for more information. Do not breast-feed an infant while taking this medicine. Men are advised not to father a child while receiving this medicine. This product may contain alcohol. Ask your pharmacist or healthcare provider if this medicine contains alcohol. Be sure to tell all healthcare providers you are taking this medicine. Certain medicines, like metronidazole and disulfiram, can cause an unpleasant reaction when taken with alcohol. The reaction includes flushing, headache, nausea, vomiting, sweating, and increased thirst. The reaction can last from 30 minutes to several hours. What side effects may I notice from receiving this medicine? Side effects that you should report to your doctor or health care professional as soon as possible:  allergic reactions like skin rash, itching or hives, swelling of the face, lips, or tongue  breathing problems  changes in vision  fast, irregular heartbeat  high or low blood pressure  mouth sores  pain, tingling, numbness in the hands or feet  signs of decreased platelets or bleeding - bruising, pinpoint red spots on the skin, black, tarry stools, blood in the urine  signs of decreased red blood cells - unusually weak or tired, feeling faint or lightheaded, falls  signs of infection - fever or chills, cough, sore throat, pain or difficulty passing urine  signs and symptoms of liver injury like dark yellow or brown urine; general ill feeling or flu-like symptoms; light-colored stools; loss of appetite; nausea; right upper belly pain; unusually weak or tired; yellowing of the eyes or skin  swelling of the ankles, feet, hands  unusually slow heartbeat Side effects that usually do not require medical attention (report to your doctor or health care professional if  they continue or are bothersome):  diarrhea  hair loss  loss of appetite  muscle or joint pain  nausea, vomiting  pain, redness, or irritation at site where injected  tiredness This list may not describe all possible side effects. Call your doctor for medical advice about side effects. You may report side effects to FDA at 1-800-FDA-1088. Where should I keep my medicine? This drug is given in a hospital or clinic and will not be stored at home. NOTE: This sheet is a summary. It may not cover all possible information. If you have questions about this medicine, talk to your doctor, pharmacist, or health care provider.  2020 Elsevier/Gold Standard (2017-07-26 13:14:55)

## 2020-11-10 NOTE — Assessment & Plan Note (Signed)
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 Adjuvant radiation therapy followed by 3. Adjuvant antiestrogen therapywith anastrozole (patient is postmenopausal) and anti-HER-2 therapy with neratinib -------------------------------------------------------------------------------------------------------------------------------- Current treatment: cycle 2 Taxol-herceptin Echo: EF 60-65% Labs reviewed Chemo consent obtained. Chemo education Completed RTC weekly for chemo and every other week for follow-up with me

## 2020-11-12 ENCOUNTER — Telehealth: Payer: Self-pay | Admitting: Hematology and Oncology

## 2020-11-12 NOTE — Telephone Encounter (Signed)
No 12/6 los, no changes made to pt schedule  

## 2020-11-14 ENCOUNTER — Encounter: Payer: Self-pay | Admitting: *Deleted

## 2020-11-17 ENCOUNTER — Other Ambulatory Visit: Payer: Self-pay

## 2020-11-17 ENCOUNTER — Inpatient Hospital Stay: Payer: Medicaid Other

## 2020-11-17 VITALS — BP 112/81 | HR 61 | Temp 98.4°F | Resp 18

## 2020-11-17 DIAGNOSIS — C50411 Malignant neoplasm of upper-outer quadrant of right female breast: Secondary | ICD-10-CM

## 2020-11-17 DIAGNOSIS — Z5112 Encounter for antineoplastic immunotherapy: Secondary | ICD-10-CM | POA: Diagnosis not present

## 2020-11-17 LAB — CBC WITH DIFFERENTIAL (CANCER CENTER ONLY)
Abs Immature Granulocytes: 0.02 10*3/uL (ref 0.00–0.07)
Basophils Absolute: 0.1 10*3/uL (ref 0.0–0.1)
Basophils Relative: 1 %
Eosinophils Absolute: 0.1 10*3/uL (ref 0.0–0.5)
Eosinophils Relative: 2 %
HCT: 35 % — ABNORMAL LOW (ref 36.0–46.0)
Hemoglobin: 12.2 g/dL (ref 12.0–15.0)
Immature Granulocytes: 0 %
Lymphocytes Relative: 40 %
Lymphs Abs: 2.2 10*3/uL (ref 0.7–4.0)
MCH: 27.2 pg (ref 26.0–34.0)
MCHC: 34.9 g/dL (ref 30.0–36.0)
MCV: 78 fL — ABNORMAL LOW (ref 80.0–100.0)
Monocytes Absolute: 0.3 10*3/uL (ref 0.1–1.0)
Monocytes Relative: 6 %
Neutro Abs: 2.8 10*3/uL (ref 1.7–7.7)
Neutrophils Relative %: 51 %
Platelet Count: 249 10*3/uL (ref 150–400)
RBC: 4.49 MIL/uL (ref 3.87–5.11)
RDW: 14 % (ref 11.5–15.5)
WBC Count: 5.5 10*3/uL (ref 4.0–10.5)
nRBC: 0 % (ref 0.0–0.2)

## 2020-11-17 LAB — CMP (CANCER CENTER ONLY)
ALT: 19 U/L (ref 0–44)
AST: 23 U/L (ref 15–41)
Albumin: 3.8 g/dL (ref 3.5–5.0)
Alkaline Phosphatase: 114 U/L (ref 38–126)
Anion gap: 4 — ABNORMAL LOW (ref 5–15)
BUN: 10 mg/dL (ref 6–20)
CO2: 24 mmol/L (ref 22–32)
Calcium: 9.1 mg/dL (ref 8.9–10.3)
Chloride: 110 mmol/L (ref 98–111)
Creatinine: 0.82 mg/dL (ref 0.44–1.00)
GFR, Estimated: >60 mL/min (ref >60)
Glucose, Bld: 88 mg/dL (ref 70–99)
Potassium: 4.3 mmol/L (ref 3.5–5.1)
Sodium: 138 mmol/L (ref 135–145)
Total Bilirubin: 0.3 mg/dL (ref 0.3–1.2)
Total Protein: 7.1 g/dL (ref 6.5–8.1)

## 2020-11-17 MED ORDER — DIPHENHYDRAMINE HCL 50 MG/ML IJ SOLN
INTRAMUSCULAR | Status: AC
  Filled 2020-11-17: qty 1

## 2020-11-17 MED ORDER — FAMOTIDINE IN NACL 20-0.9 MG/50ML-% IV SOLN
20.0000 mg | Freq: Once | INTRAVENOUS | Status: AC
  Administered 2020-11-17: 11:00:00 20 mg via INTRAVENOUS

## 2020-11-17 MED ORDER — FAMOTIDINE IN NACL 20-0.9 MG/50ML-% IV SOLN
INTRAVENOUS | Status: AC
  Filled 2020-11-17: qty 50

## 2020-11-17 MED ORDER — SODIUM CHLORIDE 0.9 % IV SOLN
10.0000 mg | Freq: Once | INTRAVENOUS | Status: AC
  Administered 2020-11-17: 11:00:00 10 mg via INTRAVENOUS
  Filled 2020-11-17: qty 10

## 2020-11-17 MED ORDER — SODIUM CHLORIDE 0.9 % IV SOLN
80.0000 mg/m2 | Freq: Once | INTRAVENOUS | Status: AC
  Administered 2020-11-17: 12:00:00 156 mg via INTRAVENOUS
  Filled 2020-11-17: qty 26

## 2020-11-17 MED ORDER — SODIUM CHLORIDE 0.9% FLUSH
10.0000 mL | INTRAVENOUS | Status: DC | PRN
  Administered 2020-11-17: 13:00:00 10 mL
  Filled 2020-11-17: qty 10

## 2020-11-17 MED ORDER — HEPARIN SOD (PORK) LOCK FLUSH 100 UNIT/ML IV SOLN
500.0000 [IU] | Freq: Once | INTRAVENOUS | Status: AC | PRN
  Administered 2020-11-17: 13:00:00 500 [IU]
  Filled 2020-11-17: qty 5

## 2020-11-17 MED ORDER — TRASTUZUMAB-DKST CHEMO 150 MG IV SOLR
2.0000 mg/kg | Freq: Once | INTRAVENOUS | Status: AC
  Administered 2020-11-17: 12:00:00 168 mg via INTRAVENOUS
  Filled 2020-11-17: qty 8

## 2020-11-17 MED ORDER — SODIUM CHLORIDE 0.9 % IV SOLN
Freq: Once | INTRAVENOUS | Status: AC
  Filled 2020-11-17: qty 250

## 2020-11-17 MED ORDER — ACETAMINOPHEN 325 MG PO TABS
ORAL_TABLET | ORAL | Status: AC
  Filled 2020-11-17: qty 2

## 2020-11-17 MED ORDER — DIPHENHYDRAMINE HCL 50 MG/ML IJ SOLN
12.5000 mg | Freq: Once | INTRAMUSCULAR | Status: AC
  Administered 2020-11-17: 11:00:00 12.5 mg via INTRAVENOUS

## 2020-11-17 MED ORDER — ACETAMINOPHEN 325 MG PO TABS
650.0000 mg | ORAL_TABLET | Freq: Once | ORAL | Status: AC
  Administered 2020-11-17: 11:00:00 650 mg via ORAL

## 2020-11-17 NOTE — Patient Instructions (Signed)

## 2020-11-17 NOTE — Progress Notes (Signed)
Pt stable and ambulatory at time of discharge 

## 2020-11-17 NOTE — Patient Instructions (Signed)
[facility] Discharge Instructions for Patients Receiving Chemotherapy  Today you received the following chemotherapy agents Trastuzumab and Taxol  To help prevent nausea and vomiting after your treatment, we encourage you to take your nausea medication as directed.    If you develop nausea and vomiting that is not controlled by your nausea medication, call the clinic.   BELOW ARE SYMPTOMS THAT SHOULD BE REPORTED IMMEDIATELY:  *FEVER GREATER THAN 100.5 F  *CHILLS WITH OR WITHOUT FEVER  NAUSEA AND VOMITING THAT IS NOT CONTROLLED WITH YOUR NAUSEA MEDICATION  *UNUSUAL SHORTNESS OF BREATH  *UNUSUAL BRUISING OR BLEEDING  TENDERNESS IN MOUTH AND THROAT WITH OR WITHOUT PRESENCE OF ULCERS  *URINARY PROBLEMS  *BOWEL PROBLEMS  UNUSUAL RASH Items with * indicate a potential emergency and should be followed up as soon as possible.  Feel free to call the clinic should you have any questions or concerns. The clinic phone number is [phone].  Please show the CHEMO ALERT CARD at check-in to the Emergency Department and triage nurse.   

## 2020-11-23 NOTE — Progress Notes (Signed)
Patient Care Team: Monica [name]., PA-C as PCP - General  DIAGNOSIS:    ICD-10-CM   1. Primary malignant neoplasm of upper outer quadrant of female breast, right ([place])  C50.411     SUMMARY OF ONCOLOGIC HISTORY: Oncology History  Primary malignant neoplasm of upper outer quadrant of female breast, right ([place])  08/12/2020 Initial Diagnosis   Screening mammogram on 07/07/20 showed an asymmetry. Diagnostic mammogram and [place] on 07/29/20 showed a 0.8cm mass at the 10 o'clock position and no right axillary adenopathy. Biopsy on 08/12/20 showed invasive and in situ mammary carcinoma, grade 2, HER-2 equivocal by IHC, positive by FISH, ER+ 80%, PR+ 60%, Ki67 15%.   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     Genetic Testing   Negative genetic testing: no pathogenic variants detected in [facility] Multi-Cancer Panel.  Variants of uncertain significance detected in ATM (c.131A>G (p.Asp44Gly) and c.4658A>C (p.Glu1553Ala)) and WRN (c.2825+6G>T (intronic)).  The report date is October 02, 2020.   The Multi-Cancer Panel offered  by [facility] includes sequencing and/or deletion duplication testing of the following 85 genes: AIP, ALK, APC, ATM, AXIN2,BAP1,  BARD1, BLM, BMPR1A, BRCA1, BRCA2, BRIP1, CASR, CDC73, CDH1, CDK4, CDKN1B, CDKN1C, CDKN2A (p14ARF), CDKN2A (p16INK4a), CEBPA, CHEK2, CTNNA1, DICER1, DIS3L2, EGFR (c.2369C>T, p.Thr790Met variant only), EPCAM (Deletion/duplication testing only), FH, FLCN, GATA2, GPC3, GREM1 (Promoter region deletion/duplication testing only), HOXB13 (c.251G>A, p.Gly84Glu), HRAS, KIT, MAX, MEN1, MET, MITF (c.952G>A, p.Glu318Lys variant only), MLH1, MSH2, MSH3, MSH6, MUTYH, NBN, NF1, NF2, NTHL1, PALB2, PDGFRA, PHOX2B, PMS2, POLD1, POLE, POT1, PRKAR1A, PTCH1, PTEN, RAD50, RAD51C, RAD51D, RB1, RECQL4, RET, RNF43, RUNX1, SDHAF2, SDHA (sequence changes only), SDHB, SDHC, SDHD, SMAD4, SMARCA4, SMARCB1, SMARCE1, STK11, SUFU, TERC, TERT, TMEM127, TP53, TSC1, TSC2, VHL, WRN and WT1.      CHIEF COMPLIANT: Cycle 4 Taxol Herceptin  INTERVAL HISTORY: PDarlean Warmothis a 49y.o. with above-mentioned history of right breast cancerwhounderwent a right lumpectomyfollowed by re-excision of a positive margin, and is currently on adjuvant chemotherapy with Taxol Herceptin. She presents to the clinic todayfor a toxicity check and cycle 4. Her major complaints with the treatment are related to mild nausea on the day of treatment.  Fatigue and intermittent constipation.  She had a small amount of blood in the stool but otherwise doing fine.  ALLERGIES:  has No Known Allergies.  MEDICATIONS:  Current Outpatient Medications  Medication Sig Dispense Refill  . albuterol (VENTOLIN HFA) 108 (90 Base) MCG/ACT inhaler Inhale into the lungs every 6 (six) hours as needed for wheezing or shortness of breath.    .Marland Kitchenalbuterol (VENTOLIN HFA) 108 (90 Base) MCG/ACT inhaler Inhale 2 puffs into the  lungs every 4 (four) hours as needed for wheezing or shortness of breath. 8 g 3  . Cholecalciferol (VITAMIN D3) 125 MCG (5000  UT) TABS Take 1 tablet by mouth daily.    Marland Kitchen lidocaine-prilocaine (EMLA) cream Apply to affected area once 30 g 3  . lisinopril (ZESTRIL) 10 MG tablet Take 1 tablet (10 mg total) by mouth daily.    . montelukast (SINGULAIR) 10 MG tablet Take 10 mg by mouth daily.    . ondansetron (ZOFRAN) 8 MG tablet Take 1 tablet (8 mg total) by mouth 2 (two) times daily as needed (Nausea or vomiting). 30 tablet 1  . prochlorperazine (COMPAZINE) 10 MG tablet Take 1 tablet (10 mg total) by mouth every 6 (six) hours as needed (Nausea or vomiting). 30 tablet 1   No current facility-administered medications for this visit.   Facility-Administered Medications Ordered in Other Visits  Medication Dose Route Frequency Provider Last Rate Last Admin  . sodium chloride flush (NS) 0.9 % injection 10 mL  10 mL Intravenous PRN Nicholas Lose, MD   10 mL at 11/24/20 0814    PHYSICAL EXAMINATION: ECOG PERFORMANCE STATUS: 1 - Symptomatic but completely ambulatory  There were no vitals filed for this visit. There were no vitals filed for this visit.  LABORATORY DATA:  I have reviewed the data as listed CMP Latest Ref Rng & Units 11/17/2020 11/10/2020 11/03/2020  Glucose 70 - 99 mg/dL 88 87 95  BUN 6 - 20 mg/dL _0 Creatinine 0.44 - 1.00 mg/dL 0.82 0.82 0.80  Sodium 135 - 145 mmol/L 138 140 138  Potassium 3.5 - 5.1 mmol/L 4.3 3.9 3.7  Chloride 98 - 111 mmol/L 110 109 109  CO2 22 - 32 mmol/L _1 Calcium 8.9 - 10.3 mg/dL 9.1 10.1 9.3  Total Protein 6.5 - 8.1 g/dL 7.1 7.4 7.5  Total Bilirubin 0.3 - 1.2 mg/dL 0.3 0.4 0.6  Alkaline Phos 38 - 126 U/L 114 99 109  AST 15 - 41 U/L _2 ALT 0 - 44 U/L _3 Lab Results  Component Value Date   WBC 6.2 11/24/2020   HGB 12.3 11/24/2020   HCT 36.5 11/24/2020   MCV 80.0 11/24/2020   PLT 282 11/24/2020   NEUTROABS 3.2 11/24/2020    ASSESSMENT & PLAN:  Primary malignant neoplasm of upper outer quadrant of female breast, right ([place]) 09/10/2020:Right  lumpectomy ([facility]): invasive lobular carcinoma, 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 Adjuvant radiation therapy followed by 3. Adjuvant antiestrogen therapywith anastrozole (patient is postmenopausal) and anti-HER-2 therapy with neratinib -------------------------------------------------------------------------------------------------------------------------------- Current treatment:cycle 4 Taxol-herceptin Echo: EF 60-65% Chemo toxicities: 1.  Mouth sores: Resolved 2.  occasional constipation 3.    Nausea: Added Zofran to her treatment. 4.  Alopecia  Return to clinic weekly for chemo and every other week for follow-up with me    No orders of the defined types were placed in this encounter.  The patient has a good understanding of the overall plan. she agrees with it. she will call with any problems that may develop before the next visit here.  Total time spent: 30 mins including face to face time and time spent for planning, charting and coordination of care  [place], Loleta Dicker,  MD 11/24/2020  Julious Oka Dorshimer, am acting as scribe for Dr. Nicholas Lose.  I have reviewed the above documentation for accuracy and completeness, and I agree with the above.

## 2020-11-24 ENCOUNTER — Encounter: Payer: Self-pay | Admitting: Licensed Clinical Social Worker

## 2020-11-24 ENCOUNTER — Inpatient Hospital Stay: Payer: Medicaid Other

## 2020-11-24 ENCOUNTER — Inpatient Hospital Stay (HOSPITAL_BASED_OUTPATIENT_CLINIC_OR_DEPARTMENT_OTHER): Payer: Medicaid Other | Admitting: Hematology and Oncology

## 2020-11-24 ENCOUNTER — Other Ambulatory Visit: Payer: Self-pay

## 2020-11-24 DIAGNOSIS — C50411 Malignant neoplasm of upper-outer quadrant of right female breast: Secondary | ICD-10-CM

## 2020-11-24 DIAGNOSIS — Z95828 Presence of other vascular implants and grafts: Secondary | ICD-10-CM

## 2020-11-24 DIAGNOSIS — Z5112 Encounter for antineoplastic immunotherapy: Secondary | ICD-10-CM | POA: Diagnosis not present

## 2020-11-24 LAB — CBC WITH DIFFERENTIAL (CANCER CENTER ONLY)
Abs Immature Granulocytes: 0.02 10*3/uL (ref 0.00–0.07)
Basophils Absolute: 0.1 10*3/uL (ref 0.0–0.1)
Basophils Relative: 1 %
Eosinophils Absolute: 0.1 10*3/uL (ref 0.0–0.5)
Eosinophils Relative: 2 %
HCT: 36.5 % (ref 36.0–46.0)
Hemoglobin: 12.3 g/dL (ref 12.0–15.0)
Immature Granulocytes: 0 %
Lymphocytes Relative: 36 %
Lymphs Abs: 2.2 10*3/uL (ref 0.7–4.0)
MCH: 27 pg (ref 26.0–34.0)
MCHC: 33.7 g/dL (ref 30.0–36.0)
MCV: 80 fL (ref 80.0–100.0)
Monocytes Absolute: 0.6 10*3/uL (ref 0.1–1.0)
Monocytes Relative: 10 %
Neutro Abs: 3.2 10*3/uL (ref 1.7–7.7)
Neutrophils Relative %: 51 %
Platelet Count: 282 10*3/uL (ref 150–400)
RBC: 4.56 MIL/uL (ref 3.87–5.11)
RDW: 14.4 % (ref 11.5–15.5)
WBC Count: 6.2 10*3/uL (ref 4.0–10.5)
nRBC: 0 % (ref 0.0–0.2)

## 2020-11-24 LAB — CMP (CANCER CENTER ONLY)
ALT: 16 U/L (ref 0–44)
AST: 19 U/L (ref 15–41)
Albumin: 3.9 g/dL (ref 3.5–5.0)
Alkaline Phosphatase: 107 U/L (ref 38–126)
Anion gap: 6 (ref 5–15)
BUN: 10 mg/dL (ref 6–20)
CO2: 25 mmol/L (ref 22–32)
Calcium: 9.4 mg/dL (ref 8.9–10.3)
Chloride: 110 mmol/L (ref 98–111)
Creatinine: 0.84 mg/dL (ref 0.44–1.00)
GFR, Estimated: >60 mL/min (ref >60)
Glucose, Bld: 97 mg/dL (ref 70–99)
Potassium: 4.8 mmol/L (ref 3.5–5.1)
Sodium: 141 mmol/L (ref 135–145)
Total Bilirubin: 0.4 mg/dL (ref 0.3–1.2)
Total Protein: 7.3 g/dL (ref 6.5–8.1)

## 2020-11-24 MED ORDER — ALTEPLASE 2 MG IJ SOLR
INTRAMUSCULAR | Status: AC
  Filled 2020-11-24: qty 2

## 2020-11-24 MED ORDER — ONDANSETRON HCL 4 MG/2ML IJ SOLN
8.0000 mg | Freq: Once | INTRAMUSCULAR | Status: AC
  Administered 2020-11-24: 10:00:00 8 mg via INTRAVENOUS

## 2020-11-24 MED ORDER — SODIUM CHLORIDE 0.9% FLUSH
10.0000 mL | INTRAVENOUS | Status: DC | PRN
  Administered 2020-11-24: 08:00:00 10 mL via INTRAVENOUS
  Filled 2020-11-24: qty 10

## 2020-11-24 MED ORDER — FAMOTIDINE IN NACL 20-0.9 MG/50ML-% IV SOLN
INTRAVENOUS | Status: AC
  Filled 2020-11-24: qty 50

## 2020-11-24 MED ORDER — DIPHENHYDRAMINE HCL 50 MG/ML IJ SOLN
INTRAMUSCULAR | Status: AC
  Filled 2020-11-24: qty 1

## 2020-11-24 MED ORDER — SODIUM CHLORIDE 0.9 % IV SOLN
80.0000 mg/m2 | Freq: Once | INTRAVENOUS | Status: AC
  Administered 2020-11-24: 11:00:00 156 mg via INTRAVENOUS
  Filled 2020-11-24: qty 26

## 2020-11-24 MED ORDER — TRASTUZUMAB-DKST CHEMO 150 MG IV SOLR
2.0000 mg/kg | Freq: Once | INTRAVENOUS | Status: AC
  Administered 2020-11-24: 10:00:00 168 mg via INTRAVENOUS
  Filled 2020-11-24: qty 8

## 2020-11-24 MED ORDER — FAMOTIDINE IN NACL 20-0.9 MG/50ML-% IV SOLN
20.0000 mg | Freq: Once | INTRAVENOUS | Status: AC
  Administered 2020-11-24: 10:00:00 20 mg via INTRAVENOUS

## 2020-11-24 MED ORDER — SODIUM CHLORIDE 0.9 % IV SOLN
10.0000 mg | Freq: Once | INTRAVENOUS | Status: AC
  Administered 2020-11-24: 10:00:00 10 mg via INTRAVENOUS
  Filled 2020-11-24: qty 10

## 2020-11-24 MED ORDER — SODIUM CHLORIDE 0.9 % IV SOLN
Freq: Once | INTRAVENOUS | Status: AC
  Filled 2020-11-24: qty 250

## 2020-11-24 MED ORDER — ACETAMINOPHEN 325 MG PO TABS
650.0000 mg | ORAL_TABLET | Freq: Once | ORAL | Status: AC
  Administered 2020-11-24: 10:00:00 650 mg via ORAL

## 2020-11-24 MED ORDER — ACETAMINOPHEN 325 MG PO TABS
ORAL_TABLET | ORAL | Status: AC
  Filled 2020-11-24: qty 2

## 2020-11-24 MED ORDER — DIPHENHYDRAMINE HCL 50 MG/ML IJ SOLN
12.5000 mg | Freq: Once | INTRAMUSCULAR | Status: AC
  Administered 2020-11-24: 10:00:00 12.5 mg via INTRAVENOUS

## 2020-11-24 MED ORDER — HEPARIN SOD (PORK) LOCK FLUSH 100 UNIT/ML IV SOLN
500.0000 [IU] | Freq: Once | INTRAVENOUS | Status: AC | PRN
  Administered 2020-11-24: 12:00:00 500 [IU]
  Filled 2020-11-24: qty 5

## 2020-11-24 MED ORDER — ALTEPLASE 2 MG IJ SOLR
2.0000 mg | Freq: Once | INTRAMUSCULAR | Status: AC | PRN
  Administered 2020-11-24: 08:00:00 2 mg
  Filled 2020-11-24: qty 2

## 2020-11-24 MED ORDER — ONDANSETRON HCL 4 MG/2ML IJ SOLN
INTRAMUSCULAR | Status: AC
  Filled 2020-11-24: qty 4

## 2020-11-24 MED ORDER — SODIUM CHLORIDE 0.9% FLUSH
10.0000 mL | INTRAVENOUS | Status: DC | PRN
Start: 2020-11-24 — End: 2020-11-24
  Administered 2020-11-24: 12:00:00 10 mL
  Filled 2020-11-24: qty 10

## 2020-11-24 NOTE — Progress Notes (Signed)
[facility] CSW Progress Note  Holiday representative met with patient in infusion for ongoing support. 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 Patient is sad that she will not see her again and grieving that loss. CSW provided space to process and grieve.  Otherwise, patient is doing well. She is making lifestyle changes with eating and drinking more water in addition to her walking. CSW normalized some pt concerns like questioning the "why" and discussed ways to begin to find her meaning. Pt is looking forward to her grandson's 3rd birthday and the holidays.   CSW will check-in with patient during infusion. Patient may call as needed in between visits.   Christeen Douglas , LCSW

## 2020-11-24 NOTE — Patient Instructions (Signed)
[facility] Discharge Instructions for Patients Receiving Chemotherapy  Today you received the following chemotherapy agents: trastuzumab and paclitaxel.  To help prevent nausea and vomiting after your treatment, we encourage you to take your nausea medication as directed.   If you develop nausea and vomiting that is not controlled by your nausea medication, call the clinic.   BELOW ARE SYMPTOMS THAT SHOULD BE REPORTED IMMEDIATELY:  *FEVER GREATER THAN 100.5 F  *CHILLS WITH OR WITHOUT FEVER  NAUSEA AND VOMITING THAT IS NOT CONTROLLED WITH YOUR NAUSEA MEDICATION  *UNUSUAL SHORTNESS OF BREATH  *UNUSUAL BRUISING OR BLEEDING  TENDERNESS IN MOUTH AND THROAT WITH OR WITHOUT PRESENCE OF ULCERS  *URINARY PROBLEMS  *BOWEL PROBLEMS  UNUSUAL RASH Items with * indicate a potential emergency and should be followed up as soon as possible.  Feel free to call the clinic should you have any questions or concerns. The clinic phone number is [phone].  Please show the CHEMO ALERT CARD at check-in to the Emergency Department and triage nurse.   

## 2020-11-24 NOTE — Patient Instructions (Signed)

## 2020-11-24 NOTE — Assessment & Plan Note (Signed)
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 Adjuvant radiation therapy followed by 3. Adjuvant antiestrogen therapywith anastrozole (patient is postmenopausal) and anti-HER-2 therapy with neratinib -------------------------------------------------------------------------------------------------------------------------------- Current treatment:cycle 4 Taxol-herceptin Echo: EF 60-65% Chemo toxicities: 1.  Mouth sores 2. diarrhea: When she took Imodium she got constipated 3.  Fatigue  Return to clinic weekly for chemo and every other week for follow-up with me

## 2020-11-24 NOTE — Progress Notes (Signed)
CATHFLO given by Berneta Sages, RN at [phone] sent patient back to lab for blood draw.

## 2020-11-27 ENCOUNTER — Telehealth: Payer: Self-pay | Admitting: Hematology and Oncology

## 2020-11-27 NOTE — Telephone Encounter (Signed)
Scheduled per 12/20 los. Pt will receive an updated appt calendar per next visit appt notes

## 2020-12-01 ENCOUNTER — Inpatient Hospital Stay: Payer: Medicaid Other

## 2020-12-01 ENCOUNTER — Encounter: Payer: Self-pay | Admitting: Licensed Clinical Social Worker

## 2020-12-01 ENCOUNTER — Other Ambulatory Visit: Payer: Self-pay

## 2020-12-01 VITALS — BP 113/69 | HR 67 | Temp 97.7°F | Resp 16 | Ht 63.0 in | Wt 189.8 lb

## 2020-12-01 DIAGNOSIS — C50411 Malignant neoplasm of upper-outer quadrant of right female breast: Secondary | ICD-10-CM

## 2020-12-01 DIAGNOSIS — Z95828 Presence of other vascular implants and grafts: Secondary | ICD-10-CM

## 2020-12-01 DIAGNOSIS — Z5112 Encounter for antineoplastic immunotherapy: Secondary | ICD-10-CM | POA: Diagnosis not present

## 2020-12-01 LAB — CMP (CANCER CENTER ONLY)
ALT: 16 U/L (ref 0–44)
AST: 22 U/L (ref 15–41)
Albumin: 4.1 g/dL (ref 3.5–5.0)
Alkaline Phosphatase: 125 U/L (ref 38–126)
Anion gap: 9 (ref 5–15)
BUN: 12 mg/dL (ref 6–20)
CO2: 22 mmol/L (ref 22–32)
Calcium: 9.1 mg/dL (ref 8.9–10.3)
Chloride: 106 mmol/L (ref 98–111)
Creatinine: 0.88 mg/dL (ref 0.44–1.00)
GFR, Estimated: >60 mL/min (ref >60)
Glucose, Bld: 100 mg/dL — ABNORMAL HIGH (ref 70–99)
Potassium: 4.1 mmol/L (ref 3.5–5.1)
Sodium: 137 mmol/L (ref 135–145)
Total Bilirubin: 0.3 mg/dL (ref 0.3–1.2)
Total Protein: 7.2 g/dL (ref 6.5–8.1)

## 2020-12-01 LAB — CBC WITH DIFFERENTIAL (CANCER CENTER ONLY)
Abs Immature Granulocytes: 0.03 10*3/uL (ref 0.00–0.07)
Basophils Absolute: 0.1 10*3/uL (ref 0.0–0.1)
Basophils Relative: 1 %
Eosinophils Absolute: 0.1 10*3/uL (ref 0.0–0.5)
Eosinophils Relative: 2 %
HCT: 34.3 % — ABNORMAL LOW (ref 36.0–46.0)
Hemoglobin: 11.9 g/dL — ABNORMAL LOW (ref 12.0–15.0)
Immature Granulocytes: 1 %
Lymphocytes Relative: 32 %
Lymphs Abs: 2.1 10*3/uL (ref 0.7–4.0)
MCH: 27.7 pg (ref 26.0–34.0)
MCHC: 34.7 g/dL (ref 30.0–36.0)
MCV: 80 fL (ref 80.0–100.0)
Monocytes Absolute: 0.7 10*3/uL (ref 0.1–1.0)
Monocytes Relative: 10 %
Neutro Abs: 3.6 10*3/uL (ref 1.7–7.7)
Neutrophils Relative %: 54 %
Platelet Count: 287 10*3/uL (ref 150–400)
RBC: 4.29 MIL/uL (ref 3.87–5.11)
RDW: 14.6 % (ref 11.5–15.5)
WBC Count: 6.5 10*3/uL (ref 4.0–10.5)
nRBC: 0 % (ref 0.0–0.2)

## 2020-12-01 MED ORDER — SODIUM CHLORIDE 0.9% FLUSH
10.0000 mL | INTRAVENOUS | Status: DC | PRN
  Administered 2020-12-01: 13:00:00 10 mL
  Filled 2020-12-01: qty 10

## 2020-12-01 MED ORDER — ACETAMINOPHEN 325 MG PO TABS
ORAL_TABLET | ORAL | Status: AC
  Filled 2020-12-01: qty 2

## 2020-12-01 MED ORDER — ONDANSETRON HCL 4 MG/2ML IJ SOLN
8.0000 mg | Freq: Once | INTRAMUSCULAR | Status: AC
  Administered 2020-12-01: 10:00:00 8 mg via INTRAVENOUS

## 2020-12-01 MED ORDER — ACETAMINOPHEN 325 MG PO TABS
650.0000 mg | ORAL_TABLET | Freq: Once | ORAL | Status: AC
  Administered 2020-12-01: 10:00:00 650 mg via ORAL

## 2020-12-01 MED ORDER — SODIUM CHLORIDE 0.9 % IV SOLN
Freq: Once | INTRAVENOUS | Status: AC
  Filled 2020-12-01: qty 250

## 2020-12-01 MED ORDER — TRASTUZUMAB-DKST CHEMO 150 MG IV SOLR
2.0000 mg/kg | Freq: Once | INTRAVENOUS | Status: AC
  Administered 2020-12-01: 11:00:00 168 mg via INTRAVENOUS
  Filled 2020-12-01: qty 8

## 2020-12-01 MED ORDER — FAMOTIDINE IN NACL 20-0.9 MG/50ML-% IV SOLN
20.0000 mg | Freq: Once | INTRAVENOUS | Status: AC
  Administered 2020-12-01: 10:00:00 20 mg via INTRAVENOUS

## 2020-12-01 MED ORDER — SODIUM CHLORIDE 0.9 % IV SOLN
80.0000 mg/m2 | Freq: Once | INTRAVENOUS | Status: AC
  Administered 2020-12-01: 12:00:00 156 mg via INTRAVENOUS
  Filled 2020-12-01: qty 26

## 2020-12-01 MED ORDER — SODIUM CHLORIDE 0.9 % IV SOLN
10.0000 mg | Freq: Once | INTRAVENOUS | Status: AC
  Administered 2020-12-01: 10:00:00 10 mg via INTRAVENOUS
  Filled 2020-12-01: qty 10

## 2020-12-01 MED ORDER — FAMOTIDINE IN NACL 20-0.9 MG/50ML-% IV SOLN
INTRAVENOUS | Status: AC
  Filled 2020-12-01: qty 50

## 2020-12-01 MED ORDER — DIPHENHYDRAMINE HCL 50 MG/ML IJ SOLN
12.5000 mg | Freq: Once | INTRAMUSCULAR | Status: AC
  Administered 2020-12-01: 10:00:00 12.5 mg via INTRAVENOUS

## 2020-12-01 MED ORDER — SODIUM CHLORIDE 0.9% FLUSH
10.0000 mL | INTRAVENOUS | Status: DC | PRN
  Administered 2020-12-01: 08:00:00 10 mL via INTRAVENOUS
  Filled 2020-12-01: qty 10

## 2020-12-01 MED ORDER — ONDANSETRON HCL 4 MG/2ML IJ SOLN
INTRAMUSCULAR | Status: AC
  Filled 2020-12-01: qty 4

## 2020-12-01 MED ORDER — HEPARIN SOD (PORK) LOCK FLUSH 100 UNIT/ML IV SOLN
500.0000 [IU] | Freq: Once | INTRAVENOUS | Status: AC | PRN
  Administered 2020-12-01: 13:00:00 500 [IU]
  Filled 2020-12-01: qty 5

## 2020-12-01 MED ORDER — DIPHENHYDRAMINE HCL 50 MG/ML IJ SOLN
INTRAMUSCULAR | Status: AC
  Filled 2020-12-01: qty 1

## 2020-12-01 NOTE — Patient Instructions (Signed)
[facility] Discharge Instructions for Patients Receiving Chemotherapy  Today you received the following chemotherapy agents Trastuzumab-dkst (OGIVRI) & Paclitaxel (TAXOL).  To help prevent nausea and vomiting after your treatment, we encourage you to take your nausea medication as prescribed.  If you develop nausea and vomiting that is not controlled by your nausea medication, call the clinic.   BELOW ARE SYMPTOMS THAT SHOULD BE REPORTED IMMEDIATELY:  *FEVER GREATER THAN 100.5 F  *CHILLS WITH OR WITHOUT FEVER  NAUSEA AND VOMITING THAT IS NOT CONTROLLED WITH YOUR NAUSEA MEDICATION  *UNUSUAL SHORTNESS OF BREATH  *UNUSUAL BRUISING OR BLEEDING  TENDERNESS IN MOUTH AND THROAT WITH OR WITHOUT PRESENCE OF ULCERS  *URINARY PROBLEMS  *BOWEL PROBLEMS  UNUSUAL RASH Items with * indicate a potential emergency and should be followed up as soon as possible.  Feel free to call the clinic should you have any questions or concerns. The clinic phone number is [phone].  Please show the CHEMO ALERT CARD at check-in to the Emergency Department and triage nurse.   

## 2020-12-01 NOTE — Progress Notes (Signed)
[facility] CSW Progress Note  Holiday representative met with patient to provide ongoing support.  Patient has had mixed good and bad moments since last visit. Someone she had been close with for her life died on Christmas- pt is grieving this loss and that she won't be able to attend the funeral. Her father's (who died 3 years ago) birthday is beginning of January, so this is another difficult time for her. She plans to visit his grave and bring flowers for remembrance.   While patient is grieving, she has also experienced positive moments with her family, grandson's birthday is being celebrated tomorrow, and Christmas went well with a phone call and gifts given from her workplace.    CSW provided space for patient to process the above. Will continue to check in during infusion treatments.    Christeen Douglas , LCSW

## 2020-12-03 ENCOUNTER — Telehealth: Payer: Self-pay | Admitting: *Deleted

## 2020-12-06 DIAGNOSIS — R918 Other nonspecific abnormal finding of lung field: Secondary | ICD-10-CM

## 2020-12-06 HISTORY — DX: Other nonspecific abnormal finding of lung field: R91.8

## 2020-12-07 NOTE — Progress Notes (Signed)
Patient Care Team: Raiford Simmonds., PA-C as PCP - General  DIAGNOSIS:    ICD-10-CM   1. Primary malignant neoplasm of upper outer quadrant of female breast, right ([place])  C50.411     SUMMARY OF ONCOLOGIC HISTORY: Oncology History  Primary malignant neoplasm of upper outer quadrant of female breast, right ([place])  08/12/2020 Initial Diagnosis   Screening mammogram on 07/07/20 showed an asymmetry. Diagnostic mammogram and [place] on 07/29/20 showed a 0.8cm mass at the 10 o'clock position and no right axillary adenopathy. Biopsy on 08/12/20 showed invasive and in situ mammary carcinoma, grade 2, HER-2 equivocal by IHC, positive by FISH, ER+ 80%, PR+ 60%, Ki67 15%.   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   11/03/2020 -  Chemotherapy   The patient had ondansetron (ZOFRAN) injection 8 mg, 8 mg (100 % of original dose 8 mg), Intravenous,  Once, 2 of 3 cycles Dose modification: 8 mg (original dose 8 mg, Cycle 1) Administration: 8 mg (11/24/2020), 8 mg (12/01/2020) PACLitaxel (TAXOL) 156 mg in sodium chloride 0.9 % 250 mL chemo infusion (</= 55m/m2), 80 mg/m2 = 156 mg, Intravenous,  Once, 2 of 3 cycles Administration: 156 mg (11/03/2020), 156 mg (11/10/2020), 156 mg (12/01/2020), 156 mg (11/17/2020), 156 mg (11/24/2020) trastuzumab-dkst (OGIVRI) 336 mg in sodium chloride 0.9 % 250 mL chemo infusion, 4 mg/kg = 336 mg, Intravenous,  Once, 2 of 16 cycles Administration: 336 mg (11/03/2020), 168 mg (11/10/2020), 168 mg (12/01/2020), 168 mg (11/17/2020), 168 mg (11/24/2020)  for chemotherapy treatment.     Genetic Testing    Negative genetic testing: no pathogenic variants detected in [facility] Multi-Cancer Panel.  Variants of uncertain significance detected in ATM (c.131A>G (p.Asp44Gly) and c.4658A>C (p.Glu1553Ala)) and WRN (c.2825+6G>T (intronic)).  The report date is October 02, 2020.   The Multi-Cancer Panel offered by [facility] includes sequencing and/or deletion duplication testing of the following 85 genes: AIP, ALK, APC, ATM, AXIN2,BAP1,  BARD1, BLM, BMPR1A, BRCA1, BRCA2, BRIP1, CASR, CDC73, CDH1, CDK4, CDKN1B, CDKN1C, CDKN2A (p14ARF), CDKN2A (p16INK4a), CEBPA, CHEK2, CTNNA1, DICER1, DIS3L2, EGFR (c.2369C>T, p.Thr790Met variant only), EPCAM (Deletion/duplication testing only), FH, FLCN, GATA2, GPC3, GREM1 (Promoter region deletion/duplication testing only), HOXB13 (c.251G>A, p.Gly84Glu), HRAS, KIT, MAX, MEN1, MET, MITF (c.952G>A, p.Glu318Lys variant only), MLH1, MSH2, MSH3, MSH6, MUTYH, NBN, NF1, NF2, NTHL1, PALB2, PDGFRA, PHOX2B, PMS2, POLD1, POLE, POT1, PRKAR1A, PTCH1, PTEN, RAD50, RAD51C, RAD51D, RB1, RECQL4, RET, RNF43, RUNX1, SDHAF2, SDHA (sequence changes only), SDHB, SDHC, SDHD, SMAD4, SMARCA4, SMARCB1, SMARCE1, STK11, SUFU, TERC, TERT, TMEM127, TP53, TSC1, TSC2, VHL, WRN and WT1.      CHIEF COMPLIANT: Cycle6Taxol Herceptin  INTERVAL HISTORY: PTae Robakis a 50y.o. with above-mentioned history of right breast cancerwhounderwent a right lumpectomyfollowed by re-excision of a positive margin, and is currently on adjuvant chemotherapy with Taxol Herceptin. She presents to the clinic todayfora toxicity check andcycle6.  Overall she is tolerating treatment fairly well.  She does not have any more nausea.  She denies neuropathy.  She noticed that the skin of the right thumb appears to be cracking and she is applying Aquaphor which appears to be helping.  ALLERGIES:  has No Known Allergies.  MEDICATIONS:  Current Outpatient Medications  Medication Sig Dispense Refill  . albuterol (VENTOLIN HFA)  108 (90 Base) MCG/ACT inhaler Inhale into the lungs every 6 (six) hours as needed for wheezing or shortness of breath.    Marland Kitchen albuterol (VENTOLIN HFA) 108 (90 Base) MCG/ACT inhaler Inhale 2 puffs into the lungs every 4 (four) hours as needed for wheezing or shortness of breath. 8 g 3  . Cholecalciferol (VITAMIN D3) 125 MCG (5000 UT) TABS Take 1 tablet by mouth daily.    Marland Kitchen lidocaine-prilocaine (EMLA) cream Apply to affected area once 30 g 3  . lisinopril (ZESTRIL) 10 MG tablet Take 1 tablet (10 mg total) by mouth daily.    . montelukast (SINGULAIR) 10 MG tablet Take 10 mg by mouth daily.    . ondansetron (ZOFRAN) 8 MG tablet Take 1 tablet (8 mg total) by mouth 2 (two) times daily as needed (Nausea or vomiting). 30 tablet 1  . prochlorperazine (COMPAZINE) 10 MG tablet Take 1 tablet (10 mg total) by mouth every 6 (six) hours as needed (Nausea or vomiting). 30 tablet 1   No current facility-administered medications for this visit.    PHYSICAL EXAMINATION: ECOG PERFORMANCE STATUS: 1 - Symptomatic but completely ambulatory  Vitals:   12/08/20 0950  BP: 122/71  Pulse: 73  Resp: 18  Temp: 97.6 F (36.4 C)  SpO2: 100%   Filed Weights   12/08/20 0950  Weight: 189 lb 6.4 oz (85.9 kg)    LABORATORY DATA:  I have reviewed the data as listed CMP Latest Ref Rng & Units 12/01/2020 11/24/2020 11/17/2020  Glucose 70 - 99 mg/dL 100(H) 97 88  BUN 6 - 20 mg/dL _0 Creatinine 0.44 - 1.00 mg/dL 0.88 0.84 0.82  Sodium 135 - 145 mmol/L 137 141 138  Potassium 3.5 - 5.1 mmol/L 4.1 4.8 4.3  Chloride 98 - 111 mmol/L 106 110 110  CO2 22 - 32 mmol/L _1 Calcium 8.9 - 10.3 mg/dL 9.1 9.4 9.1  Total Protein 6.5 - 8.1 g/dL 7.2 7.3 7.1  Total Bilirubin 0.3 - 1.2 mg/dL 0.3 0.4 0.3  Alkaline Phos 38 - 126 U/L 125 107 114  AST 15 - 41 U/L _2 ALT 0 - 44 U/L _3 Lab Results  Component Value Date   WBC 7.3 12/08/2020   HGB 11.9 (L) 12/08/2020   HCT 34.1 (L) 12/08/2020   MCV 79.3  (L) 12/08/2020   PLT 279 12/08/2020   NEUTROABS 4.1 12/08/2020    ASSESSMENT & PLAN:  Primary malignant neoplasm of upper outer quadrant of female breast, right ([place]) 09/10/2020:Right lumpectomy ([facility]): invasive lobular carcinoma, 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 Adjuvant radiation therapy followed by 3. Adjuvant antiestrogen therapywith anastrozole (patient is postmenopausal) and anti-HER-2 therapy with neratinib -------------------------------------------------------------------------------------------------------------------------------- Current treatment:cycle6Taxol-herceptin Echo: EF 60-65% Chemo toxicities: 1.Mouth sores: Resolved 2. occasional constipation 3.  Nausea: Improved with IV Zofran 4.  Alopecia 5.  Dry skin of the hand: Her fingers appear to be chapped especially the right number.  She is using Aquaphor which is helping. 6.  Nosebleeds: I instructed her to use a humidifier at home.  Return to clinic weekly for chemo and every other week for follow-up with me  No orders of the defined types were placed in this encounter.  The patient has a good understanding of the overall plan. she agrees with it. she will call with any problems that may develop before the next visit here.  Total time spent: 30 mins including face to face time and time spent for planning, charting and coordination of care  Nicholas Lose, MD 12/08/2020  I, Cloyde Reams Dorshimer, am acting as scribe for Dr. Nicholas Lose.  I have reviewed the above documentation for accuracy and completeness, and I agree with the above.

## 2020-12-08 ENCOUNTER — Inpatient Hospital Stay: Payer: Medicaid Other

## 2020-12-08 ENCOUNTER — Encounter: Payer: Self-pay | Admitting: Licensed Clinical Social Worker

## 2020-12-08 ENCOUNTER — Inpatient Hospital Stay (HOSPITAL_BASED_OUTPATIENT_CLINIC_OR_DEPARTMENT_OTHER): Payer: Medicaid Other | Admitting: Hematology and Oncology

## 2020-12-08 ENCOUNTER — Inpatient Hospital Stay: Payer: Medicaid Other | Attending: Hematology and Oncology

## 2020-12-08 ENCOUNTER — Encounter: Payer: Self-pay | Admitting: *Deleted

## 2020-12-08 ENCOUNTER — Other Ambulatory Visit: Payer: Self-pay

## 2020-12-08 DIAGNOSIS — Z95828 Presence of other vascular implants and grafts: Secondary | ICD-10-CM

## 2020-12-08 DIAGNOSIS — Z5111 Encounter for antineoplastic chemotherapy: Secondary | ICD-10-CM | POA: Insufficient documentation

## 2020-12-08 DIAGNOSIS — K5900 Constipation, unspecified: Secondary | ICD-10-CM | POA: Diagnosis not present

## 2020-12-08 DIAGNOSIS — C773 Secondary and unspecified malignant neoplasm of axilla and upper limb lymph nodes: Secondary | ICD-10-CM | POA: Insufficient documentation

## 2020-12-08 DIAGNOSIS — C50411 Malignant neoplasm of upper-outer quadrant of right female breast: Secondary | ICD-10-CM | POA: Diagnosis present

## 2020-12-08 DIAGNOSIS — Z79899 Other long term (current) drug therapy: Secondary | ICD-10-CM | POA: Insufficient documentation

## 2020-12-08 DIAGNOSIS — Z5112 Encounter for antineoplastic immunotherapy: Secondary | ICD-10-CM | POA: Diagnosis present

## 2020-12-08 DIAGNOSIS — Z170 Estrogen receptor positive status [ER+]: Secondary | ICD-10-CM | POA: Insufficient documentation

## 2020-12-08 LAB — CMP (CANCER CENTER ONLY)
ALT: 16 U/L (ref 0–44)
AST: 18 U/L (ref 15–41)
Albumin: 3.8 g/dL (ref 3.5–5.0)
Alkaline Phosphatase: 93 U/L (ref 38–126)
Anion gap: 3 — ABNORMAL LOW (ref 5–15)
BUN: 13 mg/dL (ref 6–20)
CO2: 23 mmol/L (ref 22–32)
Calcium: 9.1 mg/dL (ref 8.9–10.3)
Chloride: 110 mmol/L (ref 98–111)
Creatinine: 0.85 mg/dL (ref 0.44–1.00)
GFR, Estimated: >60 mL/min (ref >60)
Glucose, Bld: 92 mg/dL (ref 70–99)
Potassium: 4.2 mmol/L (ref 3.5–5.1)
Sodium: 136 mmol/L (ref 135–145)
Total Bilirubin: 0.4 mg/dL (ref 0.3–1.2)
Total Protein: 7.2 g/dL (ref 6.5–8.1)

## 2020-12-08 LAB — CBC WITH DIFFERENTIAL (CANCER CENTER ONLY)
Abs Immature Granulocytes: 0.03 10*3/uL (ref 0.00–0.07)
Basophils Absolute: 0 10*3/uL (ref 0.0–0.1)
Basophils Relative: 1 %
Eosinophils Absolute: 0.1 10*3/uL (ref 0.0–0.5)
Eosinophils Relative: 2 %
HCT: 34.1 % — ABNORMAL LOW (ref 36.0–46.0)
Hemoglobin: 11.9 g/dL — ABNORMAL LOW (ref 12.0–15.0)
Immature Granulocytes: 0 %
Lymphocytes Relative: 32 %
Lymphs Abs: 2.4 10*3/uL (ref 0.7–4.0)
MCH: 27.7 pg (ref 26.0–34.0)
MCHC: 34.9 g/dL (ref 30.0–36.0)
MCV: 79.3 fL — ABNORMAL LOW (ref 80.0–100.0)
Monocytes Absolute: 0.6 10*3/uL (ref 0.1–1.0)
Monocytes Relative: 8 %
Neutro Abs: 4.1 10*3/uL (ref 1.7–7.7)
Neutrophils Relative %: 57 %
Platelet Count: 279 10*3/uL (ref 150–400)
RBC: 4.3 MIL/uL (ref 3.87–5.11)
RDW: 14.8 % (ref 11.5–15.5)
WBC Count: 7.3 10*3/uL (ref 4.0–10.5)
nRBC: 0 % (ref 0.0–0.2)

## 2020-12-08 MED ORDER — SODIUM CHLORIDE 0.9% FLUSH
10.0000 mL | INTRAVENOUS | Status: DC | PRN
  Administered 2020-12-08: 10 mL
  Filled 2020-12-08: qty 10

## 2020-12-08 MED ORDER — TRASTUZUMAB-DKST CHEMO 150 MG IV SOLR
2.0000 mg/kg | Freq: Once | INTRAVENOUS | Status: AC
  Administered 2020-12-08: 168 mg via INTRAVENOUS
  Filled 2020-12-08: qty 8

## 2020-12-08 MED ORDER — ACETAMINOPHEN 325 MG PO TABS
650.0000 mg | ORAL_TABLET | Freq: Once | ORAL | Status: AC
Start: 2020-12-08 — End: 2020-12-08
  Administered 2020-12-08: 650 mg via ORAL

## 2020-12-08 MED ORDER — ONDANSETRON HCL 4 MG/2ML IJ SOLN
8.0000 mg | Freq: Once | INTRAMUSCULAR | Status: AC
  Administered 2020-12-08: 8 mg via INTRAVENOUS

## 2020-12-08 MED ORDER — HEPARIN SOD (PORK) LOCK FLUSH 100 UNIT/ML IV SOLN
500.0000 [IU] | Freq: Once | INTRAVENOUS | Status: DC
  Filled 2020-12-08: qty 5

## 2020-12-08 MED ORDER — SODIUM CHLORIDE 0.9 % IV SOLN
80.0000 mg/m2 | Freq: Once | INTRAVENOUS | Status: AC
  Administered 2020-12-08: 156 mg via INTRAVENOUS
  Filled 2020-12-08: qty 26

## 2020-12-08 MED ORDER — SODIUM CHLORIDE 0.9 % IV SOLN
Freq: Once | INTRAVENOUS | Status: AC
  Filled 2020-12-08: qty 250

## 2020-12-08 MED ORDER — DIPHENHYDRAMINE HCL 50 MG/ML IJ SOLN
12.5000 mg | Freq: Once | INTRAMUSCULAR | Status: AC
  Administered 2020-12-08: 12.5 mg via INTRAVENOUS

## 2020-12-08 MED ORDER — DIPHENHYDRAMINE HCL 50 MG/ML IJ SOLN
INTRAMUSCULAR | Status: AC
  Filled 2020-12-08: qty 1

## 2020-12-08 MED ORDER — HEPARIN SOD (PORK) LOCK FLUSH 100 UNIT/ML IV SOLN
500.0000 [IU] | Freq: Once | INTRAVENOUS | Status: AC | PRN
  Administered 2020-12-08: 500 [IU]
  Filled 2020-12-08: qty 5

## 2020-12-08 MED ORDER — FAMOTIDINE IN NACL 20-0.9 MG/50ML-% IV SOLN
INTRAVENOUS | Status: AC
  Filled 2020-12-08: qty 50

## 2020-12-08 MED ORDER — ONDANSETRON HCL 4 MG/2ML IJ SOLN
INTRAMUSCULAR | Status: AC
  Filled 2020-12-08: qty 4

## 2020-12-08 MED ORDER — SODIUM CHLORIDE 0.9 % IV SOLN
10.0000 mg | Freq: Once | INTRAVENOUS | Status: AC
  Administered 2020-12-08: 10 mg via INTRAVENOUS
  Filled 2020-12-08: qty 10

## 2020-12-08 MED ORDER — FAMOTIDINE IN NACL 20-0.9 MG/50ML-% IV SOLN
20.0000 mg | Freq: Once | INTRAVENOUS | Status: AC
  Administered 2020-12-08: 20 mg via INTRAVENOUS

## 2020-12-08 MED ORDER — SODIUM CHLORIDE 0.9% FLUSH
10.0000 mL | INTRAVENOUS | Status: DC | PRN
  Administered 2020-12-08: 10 mL via INTRAVENOUS
  Filled 2020-12-08: qty 10

## 2020-12-08 MED ORDER — ACETAMINOPHEN 325 MG PO TABS
ORAL_TABLET | ORAL | Status: AC
  Filled 2020-12-08: qty 2

## 2020-12-08 NOTE — Patient Instructions (Signed)
[facility] Discharge Instructions for Patients Receiving Chemotherapy  Today you received the following chemotherapy agents Trastuzumab-dkst (OGIVRI) & Paclitaxel (TAXOL).  To help prevent nausea and vomiting after your treatment, we encourage you to take your nausea medication as prescribed.  If you develop nausea and vomiting that is not controlled by your nausea medication, call the clinic.   BELOW ARE SYMPTOMS THAT SHOULD BE REPORTED IMMEDIATELY:  *FEVER GREATER THAN 100.5 F  *CHILLS WITH OR WITHOUT FEVER  NAUSEA AND VOMITING THAT IS NOT CONTROLLED WITH YOUR NAUSEA MEDICATION  *UNUSUAL SHORTNESS OF BREATH  *UNUSUAL BRUISING OR BLEEDING  TENDERNESS IN MOUTH AND THROAT WITH OR WITHOUT PRESENCE OF ULCERS  *URINARY PROBLEMS  *BOWEL PROBLEMS  UNUSUAL RASH Items with * indicate a potential emergency and should be followed up as soon as possible.  Feel free to call the clinic should you have any questions or concerns. The clinic phone number is [phone].  Please show the CHEMO ALERT CARD at check-in to the Emergency Department and triage nurse.   

## 2020-12-08 NOTE — Progress Notes (Signed)
[facility] CSW Progress Note  Holiday representative met with patient in infusion to provide ongoing support. Patient reports doing well overall. Had a hard day on 17-Feb-2023 due to her deceased father's birthday. She was able to do a remembrance at the [facility] with her sister. She continues to have moments of questioning "why me" as well. CSW provided supportive counsel and continued discussion on meaning making.   Will continue to see patient periodically during treatment. Patient may call as needed between visits.    Christeen Douglas , LCSW

## 2020-12-08 NOTE — Assessment & Plan Note (Signed)
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 Adjuvant radiation therapy followed by 3. Adjuvant antiestrogen therapywith anastrozole (patient is postmenopausal) and anti-HER-2 therapy with neratinib -------------------------------------------------------------------------------------------------------------------------------- Current treatment:cycle6Taxol-herceptin Echo: EF 60-65% Chemo toxicities: 1.Mouth sores: Resolved 2. occasional constipation 3.  Nausea: Added Zofran to her treatment. 4.  Alopecia  Return to clinic weekly for chemo and every other week for follow-up with me

## 2020-12-09 ENCOUNTER — Telehealth: Payer: Self-pay | Admitting: Hematology and Oncology

## 2020-12-09 NOTE — Telephone Encounter (Signed)
No 1/3 los, no changes made to pt schedule

## 2020-12-15 ENCOUNTER — Inpatient Hospital Stay: Payer: Medicaid Other

## 2020-12-15 ENCOUNTER — Other Ambulatory Visit: Payer: Self-pay

## 2020-12-15 VITALS — BP 115/60 | HR 86 | Temp 98.7°F | Resp 18 | Wt 189.0 lb

## 2020-12-15 DIAGNOSIS — C50411 Malignant neoplasm of upper-outer quadrant of right female breast: Secondary | ICD-10-CM

## 2020-12-15 DIAGNOSIS — Z5112 Encounter for antineoplastic immunotherapy: Secondary | ICD-10-CM | POA: Diagnosis not present

## 2020-12-15 DIAGNOSIS — Z95828 Presence of other vascular implants and grafts: Secondary | ICD-10-CM

## 2020-12-15 LAB — CBC WITH DIFFERENTIAL (CANCER CENTER ONLY)
Abs Immature Granulocytes: 0.05 10*3/uL (ref 0.00–0.07)
Basophils Absolute: 0.1 10*3/uL (ref 0.0–0.1)
Basophils Relative: 1 %
Eosinophils Absolute: 0.1 10*3/uL (ref 0.0–0.5)
Eosinophils Relative: 1 %
HCT: 34.5 % — ABNORMAL LOW (ref 36.0–46.0)
Hemoglobin: 11.8 g/dL — ABNORMAL LOW (ref 12.0–15.0)
Immature Granulocytes: 1 %
Lymphocytes Relative: 19 %
Lymphs Abs: 1.2 10*3/uL (ref 0.7–4.0)
MCH: 27.4 pg (ref 26.0–34.0)
MCHC: 34.2 g/dL (ref 30.0–36.0)
MCV: 80 fL (ref 80.0–100.0)
Monocytes Absolute: 0.7 10*3/uL (ref 0.1–1.0)
Monocytes Relative: 11 %
Neutro Abs: 4.2 10*3/uL (ref 1.7–7.7)
Neutrophils Relative %: 67 %
Platelet Count: 269 10*3/uL (ref 150–400)
RBC: 4.31 MIL/uL (ref 3.87–5.11)
RDW: 15.1 % (ref 11.5–15.5)
WBC Count: 6.2 10*3/uL (ref 4.0–10.5)
nRBC: 0 % (ref 0.0–0.2)

## 2020-12-15 LAB — CMP (CANCER CENTER ONLY)
ALT: 11 U/L (ref 0–44)
AST: 16 U/L (ref 15–41)
Albumin: 3.8 g/dL (ref 3.5–5.0)
Alkaline Phosphatase: 82 U/L (ref 38–126)
Anion gap: 6 (ref 5–15)
BUN: 12 mg/dL (ref 6–20)
CO2: 21 mmol/L — ABNORMAL LOW (ref 22–32)
Calcium: 8.9 mg/dL (ref 8.9–10.3)
Chloride: 109 mmol/L (ref 98–111)
Creatinine: 0.93 mg/dL (ref 0.44–1.00)
GFR, Estimated: >60 mL/min (ref >60)
Glucose, Bld: 100 mg/dL — ABNORMAL HIGH (ref 70–99)
Potassium: 3.9 mmol/L (ref 3.5–5.1)
Sodium: 136 mmol/L (ref 135–145)
Total Bilirubin: 0.5 mg/dL (ref 0.3–1.2)
Total Protein: 7.2 g/dL (ref 6.5–8.1)

## 2020-12-15 MED ORDER — FAMOTIDINE IN NACL 20-0.9 MG/50ML-% IV SOLN
20.0000 mg | Freq: Once | INTRAVENOUS | Status: AC
  Administered 2020-12-15: 20 mg via INTRAVENOUS

## 2020-12-15 MED ORDER — FAMOTIDINE IN NACL 20-0.9 MG/50ML-% IV SOLN
INTRAVENOUS | Status: AC
  Filled 2020-12-15: qty 50

## 2020-12-15 MED ORDER — ONDANSETRON HCL 4 MG/2ML IJ SOLN
8.0000 mg | Freq: Once | INTRAMUSCULAR | Status: AC
  Administered 2020-12-15: 8 mg via INTRAVENOUS

## 2020-12-15 MED ORDER — SODIUM CHLORIDE 0.9% FLUSH
10.0000 mL | INTRAVENOUS | Status: DC | PRN
  Administered 2020-12-15: 10 mL
  Filled 2020-12-15: qty 10

## 2020-12-15 MED ORDER — HEPARIN SOD (PORK) LOCK FLUSH 100 UNIT/ML IV SOLN
500.0000 [IU] | Freq: Once | INTRAVENOUS | Status: AC | PRN
  Administered 2020-12-15: 500 [IU]
  Filled 2020-12-15: qty 5

## 2020-12-15 MED ORDER — DIPHENHYDRAMINE HCL 50 MG/ML IJ SOLN
INTRAMUSCULAR | Status: AC
  Filled 2020-12-15: qty 1

## 2020-12-15 MED ORDER — ACETAMINOPHEN 325 MG PO TABS
ORAL_TABLET | ORAL | Status: AC
  Filled 2020-12-15: qty 2

## 2020-12-15 MED ORDER — SODIUM CHLORIDE 0.9% FLUSH
10.0000 mL | INTRAVENOUS | Status: DC | PRN
  Administered 2020-12-15: 10 mL via INTRAVENOUS
  Filled 2020-12-15: qty 10

## 2020-12-15 MED ORDER — TRASTUZUMAB-DKST CHEMO 150 MG IV SOLR
2.0000 mg/kg | Freq: Once | INTRAVENOUS | Status: AC
  Administered 2020-12-15: 168 mg via INTRAVENOUS
  Filled 2020-12-15: qty 8

## 2020-12-15 MED ORDER — ONDANSETRON HCL 4 MG/2ML IJ SOLN
INTRAMUSCULAR | Status: AC
  Filled 2020-12-15: qty 4

## 2020-12-15 MED ORDER — SODIUM CHLORIDE 0.9 % IV SOLN
80.0000 mg/m2 | Freq: Once | INTRAVENOUS | Status: AC
  Administered 2020-12-15: 156 mg via INTRAVENOUS
  Filled 2020-12-15: qty 26

## 2020-12-15 MED ORDER — DEXAMETHASONE SODIUM PHOSPHATE 100 MG/10ML IJ SOLN
10.0000 mg | Freq: Once | INTRAMUSCULAR | Status: AC
  Administered 2020-12-15: 10 mg via INTRAVENOUS
  Filled 2020-12-15: qty 10

## 2020-12-15 MED ORDER — SODIUM CHLORIDE 0.9 % IV SOLN
Freq: Once | INTRAVENOUS | Status: AC
  Filled 2020-12-15: qty 250

## 2020-12-15 MED ORDER — DIPHENHYDRAMINE HCL 50 MG/ML IJ SOLN
12.5000 mg | Freq: Once | INTRAMUSCULAR | Status: AC
  Administered 2020-12-15: 12.5 mg via INTRAVENOUS

## 2020-12-15 MED ORDER — ACETAMINOPHEN 325 MG PO TABS
650.0000 mg | ORAL_TABLET | Freq: Once | ORAL | Status: AC
  Administered 2020-12-15: 650 mg via ORAL

## 2020-12-15 NOTE — Patient Instructions (Signed)
[facility] Discharge Instructions for Patients Receiving Chemotherapy  Today you received the following chemotherapy agents: trastuzumab/paclitaxel.  To help prevent nausea and vomiting after your treatment, we encourage you to take your nausea medication as directed.   If you develop nausea and vomiting that is not controlled by your nausea medication, call the clinic.   BELOW ARE SYMPTOMS THAT SHOULD BE REPORTED IMMEDIATELY:  *FEVER GREATER THAN 100.5 F  *CHILLS WITH OR WITHOUT FEVER  NAUSEA AND VOMITING THAT IS NOT CONTROLLED WITH YOUR NAUSEA MEDICATION  *UNUSUAL SHORTNESS OF BREATH  *UNUSUAL BRUISING OR BLEEDING  TENDERNESS IN MOUTH AND THROAT WITH OR WITHOUT PRESENCE OF ULCERS  *URINARY PROBLEMS  *BOWEL PROBLEMS  UNUSUAL RASH Items with * indicate a potential emergency and should be followed up as soon as possible.  Feel free to call the clinic should you have any questions or concerns. The clinic phone number is [phone].  Please show the CHEMO ALERT CARD at check-in to the Emergency Department and triage nurse.   

## 2020-12-22 ENCOUNTER — Inpatient Hospital Stay: Payer: Medicaid Other

## 2020-12-23 ENCOUNTER — Encounter: Payer: Self-pay | Admitting: Genetic Counselor

## 2020-12-23 ENCOUNTER — Other Ambulatory Visit: Payer: Self-pay

## 2020-12-23 ENCOUNTER — Inpatient Hospital Stay: Payer: Medicaid Other

## 2020-12-23 VITALS — BP 112/70 | HR 78 | Temp 98.2°F | Resp 18 | Wt 190.0 lb

## 2020-12-23 DIAGNOSIS — Z95828 Presence of other vascular implants and grafts: Secondary | ICD-10-CM

## 2020-12-23 DIAGNOSIS — C50411 Malignant neoplasm of upper-outer quadrant of right female breast: Secondary | ICD-10-CM

## 2020-12-23 DIAGNOSIS — Z5112 Encounter for antineoplastic immunotherapy: Secondary | ICD-10-CM | POA: Diagnosis not present

## 2020-12-23 LAB — CBC WITH DIFFERENTIAL (CANCER CENTER ONLY)
Abs Immature Granulocytes: 0.03 10*3/uL (ref 0.00–0.07)
Basophils Absolute: 0 10*3/uL (ref 0.0–0.1)
Basophils Relative: 1 %
Eosinophils Absolute: 0.1 10*3/uL (ref 0.0–0.5)
Eosinophils Relative: 2 %
HCT: 33.8 % — ABNORMAL LOW (ref 36.0–46.0)
Hemoglobin: 11.7 g/dL — ABNORMAL LOW (ref 12.0–15.0)
Immature Granulocytes: 0 %
Lymphocytes Relative: 27 %
Lymphs Abs: 2.1 10*3/uL (ref 0.7–4.0)
MCH: 27.4 pg (ref 26.0–34.0)
MCHC: 34.6 g/dL (ref 30.0–36.0)
MCV: 79.2 fL — ABNORMAL LOW (ref 80.0–100.0)
Monocytes Absolute: 0.6 10*3/uL (ref 0.1–1.0)
Monocytes Relative: 8 %
Neutro Abs: 4.7 10*3/uL (ref 1.7–7.7)
Neutrophils Relative %: 62 %
Platelet Count: 291 10*3/uL (ref 150–400)
RBC: 4.27 MIL/uL (ref 3.87–5.11)
RDW: 15 % (ref 11.5–15.5)
WBC Count: 7.6 10*3/uL (ref 4.0–10.5)
nRBC: 0 % (ref 0.0–0.2)

## 2020-12-23 LAB — CMP (CANCER CENTER ONLY)
ALT: 16 U/L (ref 0–44)
AST: 22 U/L (ref 15–41)
Albumin: 4.1 g/dL (ref 3.5–5.0)
Alkaline Phosphatase: 73 U/L (ref 38–126)
Anion gap: 8 (ref 5–15)
BUN: 15 mg/dL (ref 6–20)
CO2: 23 mmol/L (ref 22–32)
Calcium: 9.2 mg/dL (ref 8.9–10.3)
Chloride: 107 mmol/L (ref 98–111)
Creatinine: 0.93 mg/dL (ref 0.44–1.00)
GFR, Estimated: >60 mL/min (ref >60)
Glucose, Bld: 91 mg/dL (ref 70–99)
Potassium: 4.2 mmol/L (ref 3.5–5.1)
Sodium: 138 mmol/L (ref 135–145)
Total Bilirubin: 0.6 mg/dL (ref 0.3–1.2)
Total Protein: 7.4 g/dL (ref 6.5–8.1)

## 2020-12-23 MED ORDER — TRASTUZUMAB-DKST CHEMO 150 MG IV SOLR
2.0000 mg/kg | Freq: Once | INTRAVENOUS | Status: AC
  Administered 2020-12-23: 168 mg via INTRAVENOUS
  Filled 2020-12-23: qty 8

## 2020-12-23 MED ORDER — SODIUM CHLORIDE 0.9 % IV SOLN
10.0000 mg | Freq: Once | INTRAVENOUS | Status: AC
  Administered 2020-12-23: 10 mg via INTRAVENOUS
  Filled 2020-12-23: qty 10

## 2020-12-23 MED ORDER — SODIUM CHLORIDE 0.9 % IV SOLN
Freq: Once | INTRAVENOUS | Status: AC
  Filled 2020-12-23: qty 250

## 2020-12-23 MED ORDER — SODIUM CHLORIDE 0.9% FLUSH
10.0000 mL | INTRAVENOUS | Status: DC | PRN
  Administered 2020-12-23: 10 mL
  Filled 2020-12-23: qty 10

## 2020-12-23 MED ORDER — FAMOTIDINE IN NACL 20-0.9 MG/50ML-% IV SOLN
INTRAVENOUS | Status: AC
  Filled 2020-12-23: qty 50

## 2020-12-23 MED ORDER — ACETAMINOPHEN 325 MG PO TABS
650.0000 mg | ORAL_TABLET | Freq: Once | ORAL | Status: AC
  Administered 2020-12-23: 650 mg via ORAL

## 2020-12-23 MED ORDER — DIPHENHYDRAMINE HCL 50 MG/ML IJ SOLN
12.5000 mg | Freq: Once | INTRAMUSCULAR | Status: AC
  Administered 2020-12-23: 12.5 mg via INTRAVENOUS

## 2020-12-23 MED ORDER — ONDANSETRON HCL 4 MG/2ML IJ SOLN
INTRAMUSCULAR | Status: AC
  Filled 2020-12-23: qty 4

## 2020-12-23 MED ORDER — ONDANSETRON HCL 4 MG/2ML IJ SOLN
8.0000 mg | Freq: Once | INTRAMUSCULAR | Status: AC
  Administered 2020-12-23: 8 mg via INTRAVENOUS

## 2020-12-23 MED ORDER — FAMOTIDINE IN NACL 20-0.9 MG/50ML-% IV SOLN
20.0000 mg | Freq: Once | INTRAVENOUS | Status: AC
  Administered 2020-12-23: 20 mg via INTRAVENOUS

## 2020-12-23 MED ORDER — ACETAMINOPHEN 325 MG PO TABS
ORAL_TABLET | ORAL | Status: AC
  Filled 2020-12-23: qty 2

## 2020-12-23 MED ORDER — HEPARIN SOD (PORK) LOCK FLUSH 100 UNIT/ML IV SOLN
500.0000 [IU] | Freq: Once | INTRAVENOUS | Status: AC | PRN
  Administered 2020-12-23: 500 [IU]
  Filled 2020-12-23: qty 5

## 2020-12-23 MED ORDER — SODIUM CHLORIDE 0.9 % IV SOLN
80.0000 mg/m2 | Freq: Once | INTRAVENOUS | Status: AC
  Administered 2020-12-23: 156 mg via INTRAVENOUS
  Filled 2020-12-23: qty 26

## 2020-12-23 MED ORDER — SODIUM CHLORIDE 0.9% FLUSH
10.0000 mL | INTRAVENOUS | Status: DC | PRN
  Administered 2020-12-23: 10 mL via INTRAVENOUS
  Filled 2020-12-23: qty 10

## 2020-12-23 MED ORDER — DIPHENHYDRAMINE HCL 50 MG/ML IJ SOLN
INTRAMUSCULAR | Status: AC
  Filled 2020-12-23: qty 1

## 2020-12-23 NOTE — Patient Instructions (Signed)

## 2020-12-23 NOTE — Patient Instructions (Signed)
[facility] Discharge Instructions for Patients Receiving Chemotherapy  Today you received the following chemotherapy agents: trastuzumab/paclitaxel.  To help prevent nausea and vomiting after your treatment, we encourage you to take your nausea medication as directed.   If you develop nausea and vomiting that is not controlled by your nausea medication, call the clinic.   BELOW ARE SYMPTOMS THAT SHOULD BE REPORTED IMMEDIATELY:  *FEVER GREATER THAN 100.5 F  *CHILLS WITH OR WITHOUT FEVER  NAUSEA AND VOMITING THAT IS NOT CONTROLLED WITH YOUR NAUSEA MEDICATION  *UNUSUAL SHORTNESS OF BREATH  *UNUSUAL BRUISING OR BLEEDING  TENDERNESS IN MOUTH AND THROAT WITH OR WITHOUT PRESENCE OF ULCERS  *URINARY PROBLEMS  *BOWEL PROBLEMS  UNUSUAL RASH Items with * indicate a potential emergency and should be followed up as soon as possible.  Feel free to call the clinic should you have any questions or concerns. The clinic phone number is [phone].  Please show the CHEMO ALERT CARD at check-in to the Emergency Department and triage nurse.   

## 2020-12-28 NOTE — Progress Notes (Signed)
Patient Care Team: Raiford Simmonds., PA-C as PCP - General  DIAGNOSIS:    ICD-10-CM   1. Primary malignant neoplasm of upper outer quadrant of female breast, right ([place])  C50.411     SUMMARY OF ONCOLOGIC HISTORY: Oncology History  Primary malignant neoplasm of upper outer quadrant of female breast, right ([place])  08/12/2020 Initial Diagnosis   Screening mammogram on 07/07/20 showed an asymmetry. Diagnostic mammogram and [place] on 07/29/20 showed a 0.8cm mass at the 10 o'clock position and no right axillary adenopathy. Biopsy on 08/12/20 showed invasive and in situ mammary carcinoma, grade 2, HER-2 equivocal by IHC, positive by FISH, ER+ 80%, PR+ 60%, Ki67 15%.   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  Variants of uncertain significance detected in ATM (c.131A>G (p.Asp44Gly) and c.4658A>C (p.Glu1553Ala)) and WRN (c.2825+6G>T (intronic)).  The report date is October 02, 2020.   The Multi-Cancer Panel offered by [facility] includes sequencing and/or deletion duplication testing of the following 85 genes: AIP, ALK, APC, ATM, AXIN2,BAP1,  BARD1, BLM, BMPR1A, BRCA1, BRCA2, BRIP1, CASR, CDC73, CDH1, CDK4, CDKN1B, CDKN1C, CDKN2A (p14ARF), CDKN2A (p16INK4a), CEBPA, CHEK2, CTNNA1, DICER1, DIS3L2, EGFR (c.2369C>T, p.Thr790Met variant only), EPCAM (Deletion/duplication testing only), FH, FLCN, GATA2,  GPC3, GREM1 (Promoter region deletion/duplication testing only), HOXB13 (c.251G>A, p.Gly84Glu), HRAS, KIT, MAX, MEN1, MET, MITF (c.952G>A, p.Glu318Lys variant only), MLH1, MSH2, MSH3, MSH6, MUTYH, NBN, NF1, NF2, NTHL1, PALB2, PDGFRA, PHOX2B, PMS2, POLD1, POLE, POT1, PRKAR1A, PTCH1, PTEN, RAD50, RAD51C, RAD51D, RB1, RECQL4, RET, RNF43, RUNX1, SDHAF2, SDHA (sequence changes only), SDHB, SDHC, SDHD, SMAD4, SMARCA4, SMARCB1, SMARCE1, STK11, SUFU, TERC, TERT, TMEM127, TP53, TSC1, TSC2, VHL, WRN and WT1.   Amended report: The variant of uncertain significance (VUS) in [place] at  c.2825+6G>T (Intronic) has been reclassified to likely benign.  The change in variant classification was made as a result of re-review of evidence in light of new variant interpretation guidelines and/or new information. The amended report date is December 18, 2020.      CHIEF COMPLIANT: Cycle9Taxol Herceptin  INTERVAL HISTORY: Monica [name] is a 50 y.o. with above-mentioned history of right breast cancerwhounderwent a right lumpectomyfollowed by re-excision of a positive margin, and is currently on adjuvant chemotherapy with Taxol Herceptin. She presents to the clinic todayfora toxicity check andcycle9.     ALLERGIES:  has No Known Allergies.  MEDICATIONS:  Current Outpatient Medications  Medication Sig Dispense Refill  . albuterol (VENTOLIN HFA) 108 (90 Base) MCG/ACT inhaler Inhale into the lungs every 6 (six) hours as needed for wheezing or shortness of breath.    Marland Kitchen albuterol (VENTOLIN HFA) 108 (90 Base) MCG/ACT inhaler Inhale 2 puffs into the lungs every 4 (four) hours as needed for wheezing or shortness of breath. 8 g 3  . Cholecalciferol (VITAMIN D3) 125 MCG (5000 UT) TABS Take 1 tablet by mouth daily.    Marland Kitchen lidocaine-prilocaine (EMLA) cream Apply to affected area once 30 g  3  . lisinopril (ZESTRIL) 10 MG tablet Take 1 tablet (10 mg total) by mouth daily.    . montelukast (SINGULAIR) 10 MG tablet Take 10 mg  by mouth daily.    . ondansetron (ZOFRAN) 8 MG tablet Take 1 tablet (8 mg total) by mouth 2 (two) times daily as needed (Nausea or vomiting). 30 tablet 1  . prochlorperazine (COMPAZINE) 10 MG tablet Take 1 tablet (10 mg total) by mouth every 6 (six) hours as needed (Nausea or vomiting). 30 tablet 1   No current facility-administered medications for this visit.    PHYSICAL EXAMINATION: ECOG PERFORMANCE STATUS: 1 - Symptomatic but completely ambulatory  There were no vitals filed for this visit. There were no vitals filed for this visit.  LABORATORY DATA:  I have reviewed the data as listed CMP Latest Ref Rng & Units 12/23/2020 12/15/2020 12/08/2020  Glucose 70 - 99 mg/dL 91 100(H) 92  BUN 6 - 20 mg/dL 15 12 13   Creatinine 0.44 - 1.00 mg/dL 0.93 0.93 0.85  Sodium 135 - 145 mmol/L 138 136 136  Potassium 3.5 - 5.1 mmol/L 4.2 3.9 4.2  Chloride 98 - 111 mmol/L 107 109 110  CO2 22 - 32 mmol/L 23 21(L) 23  Calcium 8.9 - 10.3 mg/dL 9.2 8.9 9.1  Total Protein 6.5 - 8.1 g/dL 7.4 7.2 7.2  Total Bilirubin 0.3 - 1.2 mg/dL 0.6 0.5 0.4  Alkaline Phos 38 - 126 U/L 73 82 93  AST 15 - 41 U/L 22 16 18   ALT 0 - 44 U/L 16 11 16     Lab Results  Component Value Date   WBC 7.6 12/23/2020   HGB 11.7 (L) 12/23/2020   HCT 33.8 (L) 12/23/2020   MCV 79.2 (L) 12/23/2020   PLT 291 12/23/2020   NEUTROABS 4.7 12/23/2020    ASSESSMENT & PLAN:  Primary malignant neoplasm of upper outer quadrant of female breast, right ([place]) 09/10/2020:Right lumpectomy ([facility]): invasive lobular carcinoma, 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 Adjuvant radiation therapy followed by 3. Adjuvant antiestrogen therapywith anastrozole (patient is postmenopausal) and  anti-HER-2 therapy with neratinib -------------------------------------------------------------------------------------------------------------------------------- Current treatment:cycle9Taxol-herceptin Echo: EF 60-65% Chemo toxicities: 1.Alopecia 2.  Dry skin of the hand: Her fingers appear to be chapped especially the right number.  She is using Aquaphor which is helping. 3.  Nosebleeds: I instructed her to use a humidifier at home.  Return to clinic weekly for chemo and every other week for follow-up with me Once she finishes Taxol Herceptin for 1 Herceptin maintenance. After that she will receive adjuvant radiation.   No orders of the defined types were placed in this encounter.  The patient has a good understanding of the overall plan. she agrees with it. she will call with any problems that may develop before the next visit here.  Total time spent: 30 mins including face to face time and time spent for planning, charting and coordination of care  Nicholas Lose, MD 12/29/2020  I, Cloyde Reams Dorshimer, am acting as scribe for Dr. Nicholas Lose.  I have reviewed the above documentation for accuracy and completeness, and I agree with the above.

## 2020-12-29 ENCOUNTER — Other Ambulatory Visit: Payer: Self-pay | Admitting: *Deleted

## 2020-12-29 ENCOUNTER — Other Ambulatory Visit: Payer: Self-pay

## 2020-12-29 ENCOUNTER — Inpatient Hospital Stay: Payer: Medicaid Other

## 2020-12-29 ENCOUNTER — Ambulatory Visit: Payer: Medicaid Other

## 2020-12-29 ENCOUNTER — Inpatient Hospital Stay (HOSPITAL_BASED_OUTPATIENT_CLINIC_OR_DEPARTMENT_OTHER): Payer: Medicaid Other | Admitting: Hematology and Oncology

## 2020-12-29 DIAGNOSIS — C50411 Malignant neoplasm of upper-outer quadrant of right female breast: Secondary | ICD-10-CM

## 2020-12-29 DIAGNOSIS — Z5112 Encounter for antineoplastic immunotherapy: Secondary | ICD-10-CM | POA: Diagnosis not present

## 2020-12-29 DIAGNOSIS — Z95828 Presence of other vascular implants and grafts: Secondary | ICD-10-CM

## 2020-12-29 LAB — CBC WITH DIFFERENTIAL (CANCER CENTER ONLY)
Abs Immature Granulocytes: 0.04 10*3/uL (ref 0.00–0.07)
Basophils Absolute: 0.1 10*3/uL (ref 0.0–0.1)
Basophils Relative: 1 %
Eosinophils Absolute: 0.1 10*3/uL (ref 0.0–0.5)
Eosinophils Relative: 1 %
HCT: 33.3 % — ABNORMAL LOW (ref 36.0–46.0)
Hemoglobin: 11.5 g/dL — ABNORMAL LOW (ref 12.0–15.0)
Immature Granulocytes: 1 %
Lymphocytes Relative: 27 %
Lymphs Abs: 2 10*3/uL (ref 0.7–4.0)
MCH: 27.3 pg (ref 26.0–34.0)
MCHC: 34.5 g/dL (ref 30.0–36.0)
MCV: 79.1 fL — ABNORMAL LOW (ref 80.0–100.0)
Monocytes Absolute: 0.5 10*3/uL (ref 0.1–1.0)
Monocytes Relative: 6 %
Neutro Abs: 4.9 10*3/uL (ref 1.7–7.7)
Neutrophils Relative %: 64 %
Platelet Count: 277 10*3/uL (ref 150–400)
RBC: 4.21 MIL/uL (ref 3.87–5.11)
RDW: 15.1 % (ref 11.5–15.5)
WBC Count: 7.5 10*3/uL (ref 4.0–10.5)
nRBC: 0 % (ref 0.0–0.2)

## 2020-12-29 LAB — CMP (CANCER CENTER ONLY)
ALT: 12 U/L (ref 0–44)
AST: 17 U/L (ref 15–41)
Albumin: 3.7 g/dL (ref 3.5–5.0)
Alkaline Phosphatase: 90 U/L (ref 38–126)
Anion gap: 6 (ref 5–15)
BUN: 13 mg/dL (ref 6–20)
CO2: 23 mmol/L (ref 22–32)
Calcium: 9 mg/dL (ref 8.9–10.3)
Chloride: 108 mmol/L (ref 98–111)
Creatinine: 0.81 mg/dL (ref 0.44–1.00)
GFR, Estimated: >60 mL/min (ref >60)
Glucose, Bld: 94 mg/dL (ref 70–99)
Potassium: 3.8 mmol/L (ref 3.5–5.1)
Sodium: 137 mmol/L (ref 135–145)
Total Bilirubin: 0.3 mg/dL (ref 0.3–1.2)
Total Protein: 6.9 g/dL (ref 6.5–8.1)

## 2020-12-29 MED ORDER — ONDANSETRON HCL 4 MG/2ML IJ SOLN
INTRAMUSCULAR | Status: AC
  Filled 2020-12-29: qty 2

## 2020-12-29 MED ORDER — DIPHENHYDRAMINE HCL 50 MG/ML IJ SOLN
INTRAMUSCULAR | Status: AC
  Filled 2020-12-29: qty 1

## 2020-12-29 MED ORDER — SODIUM CHLORIDE 0.9% FLUSH
10.0000 mL | INTRAVENOUS | Status: DC | PRN
  Administered 2020-12-29: 10 mL
  Filled 2020-12-29: qty 10

## 2020-12-29 MED ORDER — FAMOTIDINE IN NACL 20-0.9 MG/50ML-% IV SOLN
20.0000 mg | Freq: Once | INTRAVENOUS | Status: AC
  Administered 2020-12-29: 20 mg via INTRAVENOUS

## 2020-12-29 MED ORDER — ONDANSETRON HCL 4 MG/2ML IJ SOLN
8.0000 mg | Freq: Once | INTRAMUSCULAR | Status: AC
  Administered 2020-12-29: 8 mg via INTRAVENOUS

## 2020-12-29 MED ORDER — ACETAMINOPHEN 325 MG PO TABS
ORAL_TABLET | ORAL | Status: AC
  Filled 2020-12-29: qty 2

## 2020-12-29 MED ORDER — ACETAMINOPHEN 325 MG PO TABS
650.0000 mg | ORAL_TABLET | Freq: Once | ORAL | Status: AC
  Administered 2020-12-29: 650 mg via ORAL

## 2020-12-29 MED ORDER — TRASTUZUMAB-DKST CHEMO 150 MG IV SOLR
2.0000 mg/kg | Freq: Once | INTRAVENOUS | Status: AC
  Administered 2020-12-29: 168 mg via INTRAVENOUS
  Filled 2020-12-29: qty 8

## 2020-12-29 MED ORDER — SODIUM CHLORIDE 0.9% FLUSH
10.0000 mL | Freq: Once | INTRAVENOUS | Status: AC
  Administered 2020-12-29: 10 mL
  Filled 2020-12-29: qty 10

## 2020-12-29 MED ORDER — HEPARIN SOD (PORK) LOCK FLUSH 100 UNIT/ML IV SOLN
500.0000 [IU] | Freq: Once | INTRAVENOUS | Status: AC | PRN
  Administered 2020-12-29: 500 [IU]
  Filled 2020-12-29: qty 5

## 2020-12-29 MED ORDER — DIPHENHYDRAMINE HCL 50 MG/ML IJ SOLN
12.5000 mg | Freq: Once | INTRAMUSCULAR | Status: AC
  Administered 2020-12-29: 12.5 mg via INTRAVENOUS

## 2020-12-29 MED ORDER — SODIUM CHLORIDE 0.9 % IV SOLN
Freq: Once | INTRAVENOUS | Status: AC
  Filled 2020-12-29: qty 250

## 2020-12-29 MED ORDER — FAMOTIDINE IN NACL 20-0.9 MG/50ML-% IV SOLN
INTRAVENOUS | Status: AC
  Filled 2020-12-29: qty 50

## 2020-12-29 MED ORDER — SODIUM CHLORIDE 0.9 % IV SOLN
80.0000 mg/m2 | Freq: Once | INTRAVENOUS | Status: AC
  Administered 2020-12-29: 156 mg via INTRAVENOUS
  Filled 2020-12-29: qty 26

## 2020-12-29 MED ORDER — SODIUM CHLORIDE 0.9 % IV SOLN
10.0000 mg | Freq: Once | INTRAVENOUS | Status: AC
  Administered 2020-12-29: 10 mg via INTRAVENOUS
  Filled 2020-12-29: qty 10

## 2020-12-29 NOTE — Patient Instructions (Signed)
[facility] Discharge Instructions for Patients Receiving Chemotherapy  Today you received the following chemotherapy agents: trastuzumab, Paclitaxel  To help prevent nausea and vomiting after your treatment, we encourage you to take your nausea medication as directed.   If you develop nausea and vomiting that is not controlled by your nausea medication, call the clinic.   BELOW ARE SYMPTOMS THAT SHOULD BE REPORTED IMMEDIATELY:  *FEVER GREATER THAN 100.5 F  *CHILLS WITH OR WITHOUT FEVER  NAUSEA AND VOMITING THAT IS NOT CONTROLLED WITH YOUR NAUSEA MEDICATION  *UNUSUAL SHORTNESS OF BREATH  *UNUSUAL BRUISING OR BLEEDING  TENDERNESS IN MOUTH AND THROAT WITH OR WITHOUT PRESENCE OF ULCERS  *URINARY PROBLEMS  *BOWEL PROBLEMS  UNUSUAL RASH Items with * indicate a potential emergency and should be followed up as soon as possible.  Feel free to call the clinic should you have any questions or concerns. The clinic phone number is [phone].  Please show the [place] at check-in to the Emergency Department and triage nurse.

## 2020-12-29 NOTE — Assessment & Plan Note (Signed)
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 Adjuvant radiation therapy followed by 3. Adjuvant antiestrogen therapywith anastrozole (patient is postmenopausal) and anti-HER-2 therapy with neratinib -------------------------------------------------------------------------------------------------------------------------------- Current treatment:cycle8Taxol-herceptin Echo: EF 60-65% Chemo toxicities: 1.Mouth sores: Resolved 2.occasional constipation 3.Nausea: Improved with IV Zofran 4.Alopecia 5.  Dry skin of the hand: Her fingers appear to be chapped especially the right number.  She is using Aquaphor which is helping. 6.  Nosebleeds: I instructed her to use a humidifier at home.  Return to clinic weekly for chemo and every other week for follow-up with me

## 2020-12-30 ENCOUNTER — Encounter: Payer: Self-pay | Admitting: Licensed Clinical Social Worker

## 2020-12-30 NOTE — Progress Notes (Signed)
[place] CSW Progress Note  Clinical Education officer, museum received call from patient who is feeling overwhelmed after appointments yesterday and thinking about every component left in treatment. CSW gave patient space to process feelings and find ways to recognize her strength. Patient also receives good support from her mom and a family friend who is also going through breast cancer. Discussed steps of treatment and how to break them down to think about them in parts instead of becoming overwhelmed by the whole.  CSW will plan to see patient during next infusion treatment 01/05/2021.    Christeen Douglas , LCSW

## 2021-01-01 ENCOUNTER — Telehealth: Payer: Self-pay | Admitting: Hematology and Oncology

## 2021-01-01 NOTE — Telephone Encounter (Signed)
Scheduled per 1/24 los. Pt will receive an updated appt calendar per next visit appt notes

## 2021-01-05 ENCOUNTER — Inpatient Hospital Stay: Payer: Medicaid Other

## 2021-01-05 ENCOUNTER — Other Ambulatory Visit: Payer: Self-pay

## 2021-01-05 ENCOUNTER — Other Ambulatory Visit: Payer: Self-pay | Admitting: Medical

## 2021-01-05 ENCOUNTER — Encounter: Payer: Self-pay | Admitting: *Deleted

## 2021-01-05 ENCOUNTER — Encounter: Payer: Self-pay | Admitting: Licensed Clinical Social Worker

## 2021-01-05 ENCOUNTER — Inpatient Hospital Stay (HOSPITAL_BASED_OUTPATIENT_CLINIC_OR_DEPARTMENT_OTHER): Payer: Medicaid Other | Admitting: Medical

## 2021-01-05 VITALS — BP 121/84 | HR 75 | Temp 98.6°F | Resp 17 | Wt 192.1 lb

## 2021-01-05 DIAGNOSIS — C50411 Malignant neoplasm of upper-outer quadrant of right female breast: Secondary | ICD-10-CM

## 2021-01-05 DIAGNOSIS — Z5112 Encounter for antineoplastic immunotherapy: Secondary | ICD-10-CM | POA: Diagnosis not present

## 2021-01-05 DIAGNOSIS — Z95828 Presence of other vascular implants and grafts: Secondary | ICD-10-CM

## 2021-01-05 DIAGNOSIS — H10023 Other mucopurulent conjunctivitis, bilateral: Secondary | ICD-10-CM

## 2021-01-05 LAB — CMP (CANCER CENTER ONLY)
ALT: 14 U/L (ref 0–44)
AST: 18 U/L (ref 15–41)
Albumin: 3.7 g/dL (ref 3.5–5.0)
Alkaline Phosphatase: 93 U/L (ref 38–126)
Anion gap: 7 (ref 5–15)
BUN: 13 mg/dL (ref 6–20)
CO2: 25 mmol/L (ref 22–32)
Calcium: 9.1 mg/dL (ref 8.9–10.3)
Chloride: 108 mmol/L (ref 98–111)
Creatinine: 0.82 mg/dL (ref 0.44–1.00)
GFR, Estimated: >60 mL/min (ref >60)
Glucose, Bld: 85 mg/dL (ref 70–99)
Potassium: 4.4 mmol/L (ref 3.5–5.1)
Sodium: 140 mmol/L (ref 135–145)
Total Bilirubin: 0.4 mg/dL (ref 0.3–1.2)
Total Protein: 6.8 g/dL (ref 6.5–8.1)

## 2021-01-05 LAB — CBC WITH DIFFERENTIAL (CANCER CENTER ONLY)
Abs Immature Granulocytes: 0.08 10*3/uL — ABNORMAL HIGH (ref 0.00–0.07)
Basophils Absolute: 0.1 10*3/uL (ref 0.0–0.1)
Basophils Relative: 1 %
Eosinophils Absolute: 0.1 10*3/uL (ref 0.0–0.5)
Eosinophils Relative: 2 %
HCT: 33.7 % — ABNORMAL LOW (ref 36.0–46.0)
Hemoglobin: 11.3 g/dL — ABNORMAL LOW (ref 12.0–15.0)
Immature Granulocytes: 1 %
Lymphocytes Relative: 27 %
Lymphs Abs: 2.3 10*3/uL (ref 0.7–4.0)
MCH: 27.2 pg (ref 26.0–34.0)
MCHC: 33.5 g/dL (ref 30.0–36.0)
MCV: 81.2 fL (ref 80.0–100.0)
Monocytes Absolute: 0.7 10*3/uL (ref 0.1–1.0)
Monocytes Relative: 9 %
Neutro Abs: 5.4 10*3/uL (ref 1.7–7.7)
Neutrophils Relative %: 60 %
Platelet Count: 287 10*3/uL (ref 150–400)
RBC: 4.15 MIL/uL (ref 3.87–5.11)
RDW: 16.2 % — ABNORMAL HIGH (ref 11.5–15.5)
WBC Count: 8.7 10*3/uL (ref 4.0–10.5)
nRBC: 0 % (ref 0.0–0.2)

## 2021-01-05 MED ORDER — FAMOTIDINE IN NACL 20-0.9 MG/50ML-% IV SOLN
INTRAVENOUS | Status: AC
  Filled 2021-01-05: qty 50

## 2021-01-05 MED ORDER — SODIUM CHLORIDE 0.9 % IV SOLN
Freq: Once | INTRAVENOUS | Status: AC
  Filled 2021-01-05: qty 250

## 2021-01-05 MED ORDER — ALTEPLASE 2 MG IJ SOLR
2.0000 mg | Freq: Once | INTRAMUSCULAR | Status: AC
  Administered 2021-01-05: 2 mg
  Filled 2021-01-05: qty 2

## 2021-01-05 MED ORDER — ONDANSETRON HCL 4 MG/2ML IJ SOLN
INTRAMUSCULAR | Status: AC
  Filled 2021-01-05: qty 4

## 2021-01-05 MED ORDER — ALTEPLASE 2 MG IJ SOLR
INTRAMUSCULAR | Status: AC
  Filled 2021-01-05: qty 2

## 2021-01-05 MED ORDER — CIPROFLOXACIN HCL 0.3 % OP SOLN: 1.0000 [drp] | OPHTHALMIC | 0 refills | Status: DC

## 2021-01-05 MED ORDER — ACETAMINOPHEN 325 MG PO TABS
ORAL_TABLET | ORAL | Status: AC
  Filled 2021-01-05: qty 2

## 2021-01-05 MED ORDER — TRASTUZUMAB-DKST CHEMO 150 MG IV SOLR
2.0000 mg/kg | Freq: Once | INTRAVENOUS | Status: AC
  Administered 2021-01-05: 168 mg via INTRAVENOUS
  Filled 2021-01-05: qty 8

## 2021-01-05 MED ORDER — DIPHENHYDRAMINE HCL 50 MG/ML IJ SOLN
12.5000 mg | Freq: Once | INTRAMUSCULAR | Status: AC
  Administered 2021-01-05: 12.5 mg via INTRAVENOUS

## 2021-01-05 MED ORDER — SODIUM CHLORIDE 0.9 % IV SOLN
10.0000 mg | Freq: Once | INTRAVENOUS | Status: AC
  Administered 2021-01-05: 10 mg via INTRAVENOUS
  Filled 2021-01-05: qty 10

## 2021-01-05 MED ORDER — FAMOTIDINE IN NACL 20-0.9 MG/50ML-% IV SOLN
20.0000 mg | Freq: Once | INTRAVENOUS | Status: AC
  Administered 2021-01-05: 20 mg via INTRAVENOUS

## 2021-01-05 MED ORDER — SODIUM CHLORIDE 0.9 % IV SOLN
80.0000 mg/m2 | Freq: Once | INTRAVENOUS | Status: AC
  Administered 2021-01-05: 156 mg via INTRAVENOUS
  Filled 2021-01-05: qty 26

## 2021-01-05 MED ORDER — HEPARIN SOD (PORK) LOCK FLUSH 100 UNIT/ML IV SOLN
500.0000 [IU] | Freq: Once | INTRAVENOUS | Status: AC | PRN
  Administered 2021-01-05: 500 [IU]
  Filled 2021-01-05: qty 5

## 2021-01-05 MED ORDER — DIPHENHYDRAMINE HCL 50 MG/ML IJ SOLN
INTRAMUSCULAR | Status: AC
  Filled 2021-01-05: qty 1

## 2021-01-05 MED ORDER — ACETAMINOPHEN 325 MG PO TABS
650.0000 mg | ORAL_TABLET | Freq: Once | ORAL | Status: AC
  Administered 2021-01-05: 650 mg via ORAL

## 2021-01-05 MED ORDER — ONDANSETRON HCL 4 MG/2ML IJ SOLN
8.0000 mg | Freq: Once | INTRAMUSCULAR | Status: AC
  Administered 2021-01-05: 8 mg via INTRAVENOUS

## 2021-01-05 MED ORDER — SODIUM CHLORIDE 0.9% FLUSH
10.0000 mL | INTRAVENOUS | Status: DC | PRN
  Administered 2021-01-05: 10 mL
  Filled 2021-01-05: qty 10

## 2021-01-05 NOTE — Progress Notes (Signed)
Patient was assessed by Sandi Mealy PA due to c/o watery, burning eyes that are stuck together in the morning when she wakes up.

## 2021-01-05 NOTE — Patient Instructions (Signed)
Implanted Port Insertion, Care After This sheet gives you information about how to care for yourself after your procedure. Your health care provider may also give you more specific instructions. If you have problems or questions, contact your health care provider. What can I expect after the procedure? After the procedure, it is common to have:  Discomfort at the port insertion site.  Bruising on the skin over the port. This should improve over 3-4 days. Follow these instructions at home: Port care  After your port is placed, you will get a manufacturer's information card. The card has information about your port. Keep this card with you at all times.  Take care of the port as told by your health care provider. Ask your health care provider if you or a family member can get training for taking care of the port at home. A home health care nurse may also take care of the port.  Make sure to remember what type of port you have. Incision care  Follow instructions from your health care provider about how to take care of your port insertion site. Make sure you: ? Wash your hands with soap and water before and after you change your bandage (dressing). If soap and water are not available, use hand sanitizer. ? Change your dressing as told by your health care provider. ? Leave stitches (sutures), skin glue, or adhesive strips in place. These skin closures may need to stay in place for 2 weeks or longer. If adhesive strip edges start to loosen and curl up, you may trim the loose edges. Do not remove adhesive strips completely unless your health care provider tells you to do that.  Check your port insertion site every day for signs of infection. Check for: ? Redness, swelling, or pain. ? Fluid or blood. ? Warmth. ? Pus or a bad smell.      Activity  Return to your normal activities as told by your health care provider. Ask your health care provider what activities are safe for you.  Do not  lift anything that is heavier than 10 lb (4.5 kg), or the limit that you are told, until your health care provider says that it is safe. General instructions  Take over-the-counter and prescription medicines only as told by your health care provider.  Do not take baths, swim, or use a hot tub until your health care provider approves. Ask your health care provider if you may take showers. You may only be allowed to take sponge baths.  Do not drive for 24 hours if you were given a sedative during your procedure.  Wear a medical alert bracelet in case of an emergency. This will tell any health care providers that you have a port.  Keep all follow-up visits as told by your health care provider. This is important. Contact a health care provider if:  You cannot flush your port with saline as directed, or you cannot draw blood from the port.  You have a fever or chills.  You have redness, swelling, or pain around your port insertion site.  You have fluid or blood coming from your port insertion site.  Your port insertion site feels warm to the touch.  You have pus or a bad smell coming from the port insertion site. Get help right away if:  You have chest pain or shortness of breath.  You have bleeding from your port that you cannot control. Summary  Take care of the port as told by your   health care provider. Keep the manufacturer's information card with you at all times.  Change your dressing as told by your health care provider.  Contact a health care provider if you have a fever or chills or if you have redness, swelling, or pain around your port insertion site.  Keep all follow-up visits as told by your health care provider. This information is not intended to replace advice given to you by your health care provider. Make sure you discuss any questions you have with your health care provider. Document Revised: 06/20/2018 Document Reviewed: 06/20/2018 Elsevier Patient Education   2021 Elsevier Inc.  

## 2021-01-05 NOTE — Progress Notes (Signed)
[facility] CSW Progress Note  Holiday representative met with patient to provide ongoing coping support. Patient reports that she was notified that her job can no longer wait to hold her spot for her. She found out on Friday and was upset, but has been able to talk with her mom and reframe that it will open other opportunities for her and reduce stress of rushing back. She is applying for short-term disability.  Otherwise, patient is doing fairly well with coping today. Only other issue is eye pain- pt spoke with Lucianne Lei today to address this.  CSW will plan to see patient in 2 weeks.  Christeen Douglas , LCSW

## 2021-01-05 NOTE — Patient Instructions (Signed)
[facility] Discharge Instructions for Patients Receiving Chemotherapy  Today you received the following chemotherapy agents: Trastuzumab, Taxol  To help prevent nausea and vomiting after your treatment, we encourage you to take your nausea medication as directed.   If you develop nausea and vomiting that is not controlled by your nausea medication, call the clinic.   BELOW ARE SYMPTOMS THAT SHOULD BE REPORTED IMMEDIATELY:  *FEVER GREATER THAN 100.5 F  *CHILLS WITH OR WITHOUT FEVER  NAUSEA AND VOMITING THAT IS NOT CONTROLLED WITH YOUR NAUSEA MEDICATION  *UNUSUAL SHORTNESS OF BREATH  *UNUSUAL BRUISING OR BLEEDING  TENDERNESS IN MOUTH AND THROAT WITH OR WITHOUT PRESENCE OF ULCERS  *URINARY PROBLEMS  *BOWEL PROBLEMS  UNUSUAL RASH Items with * indicate a potential emergency and should be followed up as soon as possible.  Feel free to call the clinic should you have any questions or concerns. The clinic phone number is [phone].  Please show the CHEMO ALERT CARD at check-in to the Emergency Department and triage nurse.   

## 2021-01-05 NOTE — Progress Notes (Signed)
R chest PP accessed twice with no blood return, pt states she can taste the saline. RN adminstered cathflo. Called lab to draw her blood.

## 2021-01-07 DIAGNOSIS — H109 Unspecified conjunctivitis: Secondary | ICD-10-CM | POA: Insufficient documentation

## 2021-01-07 NOTE — Progress Notes (Signed)
The patient was seen in the infusion room today as she reported that she had been having watery and matting eyes.  She has been waking with eyes that are irritated and matted together.  She was given a prescription for ciprofloxacin ophthalmic drops.  Sandi Mealy, MHS, PA-C Physician Assistant

## 2021-01-12 ENCOUNTER — Inpatient Hospital Stay: Payer: Medicaid Other | Attending: Hematology and Oncology

## 2021-01-12 ENCOUNTER — Inpatient Hospital Stay: Payer: Medicaid Other

## 2021-01-12 ENCOUNTER — Encounter: Payer: Self-pay | Admitting: *Deleted

## 2021-01-12 ENCOUNTER — Other Ambulatory Visit: Payer: Self-pay | Admitting: *Deleted

## 2021-01-12 ENCOUNTER — Other Ambulatory Visit: Payer: Self-pay

## 2021-01-12 VITALS — BP 107/68 | HR 81 | Temp 98.4°F | Resp 18 | Wt 191.8 lb

## 2021-01-12 DIAGNOSIS — Z5111 Encounter for antineoplastic chemotherapy: Secondary | ICD-10-CM | POA: Diagnosis present

## 2021-01-12 DIAGNOSIS — Z5112 Encounter for antineoplastic immunotherapy: Secondary | ICD-10-CM | POA: Diagnosis not present

## 2021-01-12 DIAGNOSIS — Z79899 Other long term (current) drug therapy: Secondary | ICD-10-CM | POA: Insufficient documentation

## 2021-01-12 DIAGNOSIS — Z5181 Encounter for therapeutic drug level monitoring: Secondary | ICD-10-CM

## 2021-01-12 DIAGNOSIS — C50411 Malignant neoplasm of upper-outer quadrant of right female breast: Secondary | ICD-10-CM

## 2021-01-12 DIAGNOSIS — Z170 Estrogen receptor positive status [ER+]: Secondary | ICD-10-CM | POA: Insufficient documentation

## 2021-01-12 DIAGNOSIS — Z95828 Presence of other vascular implants and grafts: Secondary | ICD-10-CM

## 2021-01-12 LAB — CBC WITH DIFFERENTIAL (CANCER CENTER ONLY)
Abs Immature Granulocytes: 0.04 10*3/uL (ref 0.00–0.07)
Basophils Absolute: 0.1 10*3/uL (ref 0.0–0.1)
Basophils Relative: 1 %
Eosinophils Absolute: 0.1 10*3/uL (ref 0.0–0.5)
Eosinophils Relative: 1 %
HCT: 33.8 % — ABNORMAL LOW (ref 36.0–46.0)
Hemoglobin: 11.9 g/dL — ABNORMAL LOW (ref 12.0–15.0)
Immature Granulocytes: 1 %
Lymphocytes Relative: 27 %
Lymphs Abs: 2.1 10*3/uL (ref 0.7–4.0)
MCH: 27.9 pg (ref 26.0–34.0)
MCHC: 35.2 g/dL (ref 30.0–36.0)
MCV: 79.3 fL — ABNORMAL LOW (ref 80.0–100.0)
Monocytes Absolute: 0.8 10*3/uL (ref 0.1–1.0)
Monocytes Relative: 10 %
Neutro Abs: 4.7 10*3/uL (ref 1.7–7.7)
Neutrophils Relative %: 60 %
Platelet Count: 277 10*3/uL (ref 150–400)
RBC: 4.26 MIL/uL (ref 3.87–5.11)
RDW: 16 % — ABNORMAL HIGH (ref 11.5–15.5)
WBC Count: 7.8 10*3/uL (ref 4.0–10.5)
nRBC: 0 % (ref 0.0–0.2)

## 2021-01-12 LAB — CMP (CANCER CENTER ONLY)
ALT: 17 U/L (ref 0–44)
AST: 20 U/L (ref 15–41)
Albumin: 3.8 g/dL (ref 3.5–5.0)
Alkaline Phosphatase: 82 U/L (ref 38–126)
Anion gap: 6 (ref 5–15)
BUN: 14 mg/dL (ref 6–20)
CO2: 23 mmol/L (ref 22–32)
Calcium: 9.1 mg/dL (ref 8.9–10.3)
Chloride: 107 mmol/L (ref 98–111)
Creatinine: 0.86 mg/dL (ref 0.44–1.00)
GFR, Estimated: >60 mL/min (ref >60)
Glucose, Bld: 89 mg/dL (ref 70–99)
Potassium: 4.1 mmol/L (ref 3.5–5.1)
Sodium: 136 mmol/L (ref 135–145)
Total Bilirubin: 0.5 mg/dL (ref 0.3–1.2)
Total Protein: 7.2 g/dL (ref 6.5–8.1)

## 2021-01-12 MED ORDER — TRASTUZUMAB-DKST CHEMO 150 MG IV SOLR
2.0000 mg/kg | Freq: Once | INTRAVENOUS | Status: AC
  Administered 2021-01-12: 168 mg via INTRAVENOUS
  Filled 2021-01-12: qty 8

## 2021-01-12 MED ORDER — DIPHENHYDRAMINE HCL 50 MG/ML IJ SOLN
INTRAMUSCULAR | Status: AC
  Filled 2021-01-12: qty 1

## 2021-01-12 MED ORDER — DEXAMETHASONE SODIUM PHOSPHATE 10 MG/ML IJ SOLN
INTRAMUSCULAR | Status: AC
  Filled 2021-01-12: qty 1

## 2021-01-12 MED ORDER — SODIUM CHLORIDE 0.9% FLUSH
10.0000 mL | Freq: Once | INTRAVENOUS | Status: AC
  Administered 2021-01-12: 10 mL
  Filled 2021-01-12: qty 10

## 2021-01-12 MED ORDER — ONDANSETRON HCL 4 MG/2ML IJ SOLN
8.0000 mg | Freq: Once | INTRAMUSCULAR | Status: AC
  Administered 2021-01-12: 8 mg via INTRAVENOUS

## 2021-01-12 MED ORDER — SODIUM CHLORIDE 0.9% FLUSH
10.0000 mL | INTRAVENOUS | Status: DC | PRN
  Administered 2021-01-12: 10 mL
  Filled 2021-01-12: qty 10

## 2021-01-12 MED ORDER — FAMOTIDINE IN NACL 20-0.9 MG/50ML-% IV SOLN
20.0000 mg | Freq: Once | INTRAVENOUS | Status: AC
  Administered 2021-01-12: 20 mg via INTRAVENOUS

## 2021-01-12 MED ORDER — SODIUM CHLORIDE 0.9 % IV SOLN
10.0000 mg | Freq: Once | INTRAVENOUS | Status: AC
  Administered 2021-01-12: 10 mg via INTRAVENOUS
  Filled 2021-01-12: qty 10

## 2021-01-12 MED ORDER — SODIUM CHLORIDE 0.9 % IV SOLN
80.0000 mg/m2 | Freq: Once | INTRAVENOUS | Status: AC
  Administered 2021-01-12: 156 mg via INTRAVENOUS
  Filled 2021-01-12: qty 26

## 2021-01-12 MED ORDER — FAMOTIDINE IN NACL 20-0.9 MG/50ML-% IV SOLN
INTRAVENOUS | Status: AC
  Filled 2021-01-12: qty 50

## 2021-01-12 MED ORDER — ONDANSETRON HCL 4 MG/2ML IJ SOLN
INTRAMUSCULAR | Status: AC
  Filled 2021-01-12: qty 4

## 2021-01-12 MED ORDER — DIPHENHYDRAMINE HCL 50 MG/ML IJ SOLN
12.5000 mg | Freq: Once | INTRAMUSCULAR | Status: AC
  Administered 2021-01-12: 12.5 mg via INTRAVENOUS

## 2021-01-12 MED ORDER — HEPARIN SOD (PORK) LOCK FLUSH 100 UNIT/ML IV SOLN
500.0000 [IU] | Freq: Once | INTRAVENOUS | Status: AC | PRN
  Administered 2021-01-12: 500 [IU]
  Filled 2021-01-12: qty 5

## 2021-01-12 MED ORDER — ACETAMINOPHEN 325 MG PO TABS
ORAL_TABLET | ORAL | Status: AC
  Filled 2021-01-12: qty 2

## 2021-01-12 MED ORDER — SODIUM CHLORIDE 0.9 % IV SOLN
Freq: Once | INTRAVENOUS | Status: AC
  Filled 2021-01-12: qty 250

## 2021-01-12 MED ORDER — ACETAMINOPHEN 325 MG PO TABS
650.0000 mg | ORAL_TABLET | Freq: Once | ORAL | Status: AC
  Administered 2021-01-12: 650 mg via ORAL

## 2021-01-12 NOTE — Progress Notes (Signed)
Per MD okay to treat with Echo from 09/26/20.  Orders received to schedule pt for echo within the next 2 weeks.  Orders placed and apt scheduled.  Pt notified of date and time.

## 2021-01-12 NOTE — Patient Instructions (Signed)

## 2021-01-12 NOTE — Patient Instructions (Signed)
If you do not receive a call in the next few days to schedule your Echocardiogram, please call this number to schedule: [phone]. Thank you.  [facility] Discharge Instructions for Patients Receiving Chemotherapy  Today you received the following chemotherapy agents Traztuzumab(Herceptin), Paclitaxel(Taxane)  To help prevent nausea and vomiting after your treatment, we encourage you to take your nausea medication as directed.    If you develop nausea and vomiting that is not controlled by your nausea medication, call the clinic.   BELOW ARE SYMPTOMS THAT SHOULD BE REPORTED IMMEDIATELY:  *FEVER GREATER THAN 100.5 F  *CHILLS WITH OR WITHOUT FEVER  NAUSEA AND VOMITING THAT IS NOT CONTROLLED WITH YOUR NAUSEA MEDICATION  *UNUSUAL SHORTNESS OF BREATH  *UNUSUAL BRUISING OR BLEEDING  TENDERNESS IN MOUTH AND THROAT WITH OR WITHOUT PRESENCE OF ULCERS  *URINARY PROBLEMS  *BOWEL PROBLEMS  UNUSUAL RASH Items with * indicate a potential emergency and should be followed up as soon as possible.  Feel free to call the clinic should you have any questions or concerns. The clinic phone number is [phone].  Please show the [place] at check-in to the Emergency Department and triage nurse.

## 2021-01-16 ENCOUNTER — Other Ambulatory Visit: Payer: Self-pay

## 2021-01-16 ENCOUNTER — Encounter: Payer: Self-pay | Admitting: Licensed Clinical Social Worker

## 2021-01-16 ENCOUNTER — Ambulatory Visit (HOSPITAL_COMMUNITY)
Admission: RE | Admit: 2021-01-16 | Discharge: 2021-01-16 | Disposition: A | Discharge status: Home / Self Care | Payer: Medicaid Other | Source: Ambulatory Visit | Attending: Hematology and Oncology | Admitting: Hematology and Oncology

## 2021-01-16 DIAGNOSIS — Z79899 Other long term (current) drug therapy: Secondary | ICD-10-CM | POA: Insufficient documentation

## 2021-01-16 DIAGNOSIS — I10 Essential (primary) hypertension: Secondary | ICD-10-CM | POA: Diagnosis not present

## 2021-01-16 DIAGNOSIS — Z5181 Encounter for therapeutic drug level monitoring: Secondary | ICD-10-CM | POA: Diagnosis present

## 2021-01-16 LAB — ECHOCARDIOGRAM COMPLETE
Area-P 1/2: 2.56 cm2
S' Lateral: 2.7 cm

## 2021-01-16 NOTE — Progress Notes (Signed)
  Echocardiogram 2D Echocardiogram has been performed.  Monica [name] 01/16/2021, 9:43 AM

## 2021-01-16 NOTE — Progress Notes (Signed)
[place] CSW Progress Note  Clinical Education officer, museum received TC from patient stating that she is having a "down day". A close friend of hers from growing up just died from bone cancer and pt is mourning, especially as she feels she can't go to the funeral due to risk of Covid exposure.  She also hasn't seen her grandson (although is talking to him on the phone) since he is staying with his dad while pt's daughter is away for a couple of weeks. CSW provided supportive counsel around pt's loss and grief.   Pt did state that she is looking forward to celebrating being almost done with this part of chemotherapy and moving on to the next step (radiation).   CSW will see patient in infusion next week.   Christeen Douglas , LCSW

## 2021-01-18 NOTE — Progress Notes (Signed)
Patient Care Team: Raiford Simmonds., PA-C as PCP - General  DIAGNOSIS:    ICD-10-CM   1. Primary malignant neoplasm of upper outer quadrant of female breast, right ([place])  C50.411     SUMMARY OF ONCOLOGIC HISTORY: Oncology History  Primary malignant neoplasm of upper outer quadrant of female breast, right ([place])  08/12/2020 Initial Diagnosis   Screening mammogram on 07/07/20 showed an asymmetry. Diagnostic mammogram and [place] on 07/29/20 showed a 0.8cm mass at the 10 o'clock position and no right axillary adenopathy. Biopsy on 08/12/20 showed invasive and in situ mammary carcinoma, grade 2, HER-2 equivocal by IHC, positive by FISH, ER+ 80%, PR+ 60%, Ki67 15%.   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  Variants of uncertain significance detected in ATM (c.131A>G (p.Asp44Gly) and c.4658A>C (p.Glu1553Ala)) and WRN (c.2825+6G>T (intronic)).  The report date is October 02, 2020.   The Multi-Cancer Panel offered by [facility] includes sequencing and/or deletion duplication testing of the following 85 genes: AIP, ALK, APC, ATM, AXIN2,BAP1,  BARD1, BLM, BMPR1A, BRCA1, BRCA2, BRIP1, CASR, CDC73, CDH1, CDK4, CDKN1B, CDKN1C, CDKN2A (p14ARF), CDKN2A (p16INK4a), CEBPA, CHEK2, CTNNA1, DICER1, DIS3L2, EGFR (c.2369C>T, p.Thr790Met variant only), EPCAM (Deletion/duplication testing only), FH, FLCN, GATA2,  GPC3, GREM1 (Promoter region deletion/duplication testing only), HOXB13 (c.251G>A, p.Gly84Glu), HRAS, KIT, MAX, MEN1, MET, MITF (c.952G>A, p.Glu318Lys variant only), MLH1, MSH2, MSH3, MSH6, MUTYH, NBN, NF1, NF2, NTHL1, PALB2, PDGFRA, PHOX2B, PMS2, POLD1, POLE, POT1, PRKAR1A, PTCH1, PTEN, RAD50, RAD51C, RAD51D, RB1, RECQL4, RET, RNF43, RUNX1, SDHAF2, SDHA (sequence changes only), SDHB, SDHC, SDHD, SMAD4, SMARCA4, SMARCB1, SMARCE1, STK11, SUFU, TERC, TERT, TMEM127, TP53, TSC1, TSC2, VHL, WRN and WT1.   Amended report: The variant of uncertain significance (VUS) in [place] at  c.2825+6G>T (Intronic) has been reclassified to likely benign.  The change in variant classification was made as a result of re-review of evidence in light of new variant interpretation guidelines and/or new information. The amended report date is December 18, 2020.      CHIEF COMPLIANT: Cycle12Taxol Herceptin  INTERVAL HISTORY: Monica [name] is a 50 y.o. with above-mentioned history of right breast cancerwhounderwent a right lumpectomyfollowed by re-excision of a positive margin, and is currently on adjuvant chemotherapy with Taxol Herceptin. Echo on 01/16/21 showed an ejection fraction of 65-70%. She presents to the clinic todayfora toxicity check andcycle12. denies neuropathy. C/O fatigue. Loss of eye lashes. Previously eyes swelled up and Lucianne Lei gave her eye drops which helped a lot.   ALLERGIES:  has No Known Allergies.  MEDICATIONS:  Current Outpatient Medications  Medication Sig Dispense Refill  . bimatoprost (LATISSE) 0.03 % ophthalmic solution Place 1 application into both eyes at bedtime. Place one drop on applicator and apply evenly along the skin of the upper eyelid at base of eyelashes once daily at bedtime; repeat procedure for second eye (use a clean applicator). 3 mL 12  . albuterol (VENTOLIN HFA) 108 (90 Base) MCG/ACT  inhaler Inhale into the lungs every 6 (six) hours as needed for wheezing or  shortness of breath.    Marland Kitchen albuterol (VENTOLIN HFA) 108 (90 Base) MCG/ACT inhaler Inhale 2 puffs into the lungs every 4 (four) hours as needed for wheezing or shortness of breath. 8 g 3  . Cholecalciferol (VITAMIN D3) 125 MCG (5000 UT) TABS Take 1 tablet by mouth daily.    . ciprofloxacin (CILOXAN) 0.3 % ophthalmic solution Place 1 drop into both eyes every 2 (two) hours. Administer 1 drop, every 2 hours, while awake, for 2 days. Then 1 drop, every 4 hours, while awake, for the next 5 days. 10 mL 0  . lidocaine-prilocaine (EMLA) cream Apply to affected area once 30 g 3  . lisinopril (ZESTRIL) 10 MG tablet Take 1 tablet (10 mg total) by mouth daily.    . montelukast (SINGULAIR) 10 MG tablet Take 10 mg by mouth daily.    . ondansetron (ZOFRAN) 8 MG tablet Take 1 tablet (8 mg total) by mouth 2 (two) times daily as needed (Nausea or vomiting). 30 tablet 1   No current facility-administered medications for this visit.    PHYSICAL EXAMINATION: ECOG PERFORMANCE STATUS: 1 - Symptomatic but completely ambulatory  Vitals:   01/19/21 0933  BP: 117/78  Pulse: 83  Resp: 18  Temp: 97.7 F (36.5 C)  SpO2: 100%   Filed Weights   01/19/21 0933  Weight: 194 lb (88 kg)     LABORATORY DATA:  I have reviewed the data as listed CMP Latest Ref Rng & Units 01/12/2021 01/05/2021 12/29/2020  Glucose 70 - 99 mg/dL 89 85 94  BUN 6 - 20 mg/dL 14 13 13   Creatinine 0.44 - 1.00 mg/dL 0.86 0.82 0.81  Sodium 135 - 145 mmol/L 136 140 137  Potassium 3.5 - 5.1 mmol/L 4.1 4.4 3.8  Chloride 98 - 111 mmol/L 107 108 108  CO2 22 - 32 mmol/L 23 25 23   Calcium 8.9 - 10.3 mg/dL 9.1 9.1 9.0  Total Protein 6.5 - 8.1 g/dL 7.2 6.8 6.9  Total Bilirubin 0.3 - 1.2 mg/dL 0.5 0.4 0.3  Alkaline Phos 38 - 126 U/L 82 93 90  AST 15 - 41 U/L 20 18 17   ALT 0 - 44 U/L 17 14 12     Lab Results  Component Value Date   WBC 7.8 01/19/2021   HGB 11.5 (L) 01/19/2021   HCT 32.5 (L) 01/19/2021   MCV 79.5 (L) 01/19/2021   PLT 266  01/19/2021   NEUTROABS 4.8 01/19/2021    ASSESSMENT & PLAN:  Primary malignant neoplasm of upper outer quadrant of female breast, right ([place]) 09/10/2020:Right lumpectomy ([facility]): invasive lobular carcinoma, 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 Adjuvant radiation therapy followed by 3. Adjuvant antiestrogen therapywith anastrozole (patient is postmenopausal) and anti-HER-2 therapy with neratinib -------------------------------------------------------------------------------------------------------------------------------- Current treatment:cycle12Taxol-herceptin Echo: EF 60-65% Chemo toxicities: 1.Alopecia 2.Dry skin of the hand: Her fingers appear to be chapped especially the right number. She is using Aquaphor which is helping. 3.loss of eye lashes: Sent prescription for Latisse.  Denies neuropathy Has appts for XRT She will get herceptin maintenance q 3 weeks I will see her with labs every 6 weeks     No orders of the defined types were placed in this encounter.  The patient has a good understanding of the overall plan. she agrees with it. she will call with any problems that may develop before the next visit here.  Total time spent: 30 mins including face to face time and time spent for planning, charting and coordination of care  Rulon Eisenmenger, MD, MPH 01/19/2021  I, Molly Dorshimer, am acting as scribe for Dr. Nicholas Lose.  I have reviewed the above documentation for accuracy and completeness, and I agree with the above.

## 2021-01-19 ENCOUNTER — Inpatient Hospital Stay: Payer: Medicaid Other

## 2021-01-19 ENCOUNTER — Other Ambulatory Visit: Payer: Self-pay

## 2021-01-19 ENCOUNTER — Inpatient Hospital Stay (HOSPITAL_BASED_OUTPATIENT_CLINIC_OR_DEPARTMENT_OTHER): Payer: Medicaid Other | Admitting: Hematology and Oncology

## 2021-01-19 ENCOUNTER — Encounter: Payer: Self-pay | Admitting: *Deleted

## 2021-01-19 ENCOUNTER — Encounter: Payer: Self-pay | Admitting: Licensed Clinical Social Worker

## 2021-01-19 DIAGNOSIS — C50411 Malignant neoplasm of upper-outer quadrant of right female breast: Secondary | ICD-10-CM

## 2021-01-19 DIAGNOSIS — Z5112 Encounter for antineoplastic immunotherapy: Secondary | ICD-10-CM | POA: Diagnosis not present

## 2021-01-19 LAB — CBC WITH DIFFERENTIAL (CANCER CENTER ONLY)
Abs Immature Granulocytes: 0.05 10*3/uL (ref 0.00–0.07)
Basophils Absolute: 0.1 10*3/uL (ref 0.0–0.1)
Basophils Relative: 1 %
Eosinophils Absolute: 0.1 10*3/uL (ref 0.0–0.5)
Eosinophils Relative: 1 %
HCT: 32.5 % — ABNORMAL LOW (ref 36.0–46.0)
Hemoglobin: 11.5 g/dL — ABNORMAL LOW (ref 12.0–15.0)
Immature Granulocytes: 1 %
Lymphocytes Relative: 28 %
Lymphs Abs: 2.2 10*3/uL (ref 0.7–4.0)
MCH: 28.1 pg (ref 26.0–34.0)
MCHC: 35.4 g/dL (ref 30.0–36.0)
MCV: 79.5 fL — ABNORMAL LOW (ref 80.0–100.0)
Monocytes Absolute: 0.7 10*3/uL (ref 0.1–1.0)
Monocytes Relative: 8 %
Neutro Abs: 4.8 10*3/uL (ref 1.7–7.7)
Neutrophils Relative %: 61 %
Platelet Count: 266 10*3/uL (ref 150–400)
RBC: 4.09 MIL/uL (ref 3.87–5.11)
RDW: 16.2 % — ABNORMAL HIGH (ref 11.5–15.5)
WBC Count: 7.8 10*3/uL (ref 4.0–10.5)
nRBC: 0 % (ref 0.0–0.2)

## 2021-01-19 LAB — CMP (CANCER CENTER ONLY)
ALT: 17 U/L (ref 0–44)
AST: 20 U/L (ref 15–41)
Albumin: 3.6 g/dL (ref 3.5–5.0)
Alkaline Phosphatase: 96 U/L (ref 38–126)
Anion gap: 3 — ABNORMAL LOW (ref 5–15)
BUN: 13 mg/dL (ref 6–20)
CO2: 23 mmol/L (ref 22–32)
Calcium: 9.1 mg/dL (ref 8.9–10.3)
Chloride: 109 mmol/L (ref 98–111)
Creatinine: 0.82 mg/dL (ref 0.44–1.00)
GFR, Estimated: >60 mL/min (ref >60)
Glucose, Bld: 100 mg/dL — ABNORMAL HIGH (ref 70–99)
Potassium: 4.2 mmol/L (ref 3.5–5.1)
Sodium: 135 mmol/L (ref 135–145)
Total Bilirubin: 0.4 mg/dL (ref 0.3–1.2)
Total Protein: 7.1 g/dL (ref 6.5–8.1)

## 2021-01-19 MED ORDER — ACETAMINOPHEN 325 MG PO TABS
ORAL_TABLET | ORAL | Status: AC
  Filled 2021-01-19: qty 2

## 2021-01-19 MED ORDER — DIPHENHYDRAMINE HCL 50 MG/ML IJ SOLN
12.5000 mg | Freq: Once | INTRAMUSCULAR | Status: AC
  Administered 2021-01-19: 12.5 mg via INTRAVENOUS

## 2021-01-19 MED ORDER — SODIUM CHLORIDE 0.9% FLUSH
10.0000 mL | INTRAVENOUS | Status: DC | PRN
  Administered 2021-01-19: 10 mL
  Filled 2021-01-19: qty 10

## 2021-01-19 MED ORDER — HEPARIN SOD (PORK) LOCK FLUSH 100 UNIT/ML IV SOLN
500.0000 [IU] | Freq: Once | INTRAVENOUS | Status: AC | PRN
  Administered 2021-01-19: 500 [IU]
  Filled 2021-01-19: qty 5

## 2021-01-19 MED ORDER — SODIUM CHLORIDE 0.9 % IV SOLN
10.0000 mg | Freq: Once | INTRAVENOUS | Status: AC
  Administered 2021-01-19: 10 mg via INTRAVENOUS
  Filled 2021-01-19: qty 10

## 2021-01-19 MED ORDER — ONDANSETRON HCL 4 MG/2ML IJ SOLN
INTRAMUSCULAR | Status: AC
  Filled 2021-01-19: qty 4

## 2021-01-19 MED ORDER — ONDANSETRON HCL 4 MG/2ML IJ SOLN
8.0000 mg | Freq: Once | INTRAMUSCULAR | Status: AC
  Administered 2021-01-19: 8 mg via INTRAVENOUS

## 2021-01-19 MED ORDER — SODIUM CHLORIDE 0.9 % IV SOLN
80.0000 mg/m2 | Freq: Once | INTRAVENOUS | Status: AC
  Administered 2021-01-19: 156 mg via INTRAVENOUS
  Filled 2021-01-19: qty 26

## 2021-01-19 MED ORDER — ACETAMINOPHEN 325 MG PO TABS
650.0000 mg | ORAL_TABLET | Freq: Once | ORAL | Status: AC
  Administered 2021-01-19: 650 mg via ORAL

## 2021-01-19 MED ORDER — FAMOTIDINE IN NACL 20-0.9 MG/50ML-% IV SOLN
20.0000 mg | Freq: Once | INTRAVENOUS | Status: AC
  Administered 2021-01-19: 20 mg via INTRAVENOUS

## 2021-01-19 MED ORDER — TRASTUZUMAB-DKST CHEMO 150 MG IV SOLR
6.0000 mg/kg | Freq: Once | INTRAVENOUS | Status: AC
  Administered 2021-01-19: 504 mg via INTRAVENOUS
  Filled 2021-01-19: qty 24

## 2021-01-19 MED ORDER — FAMOTIDINE IN NACL 20-0.9 MG/50ML-% IV SOLN
INTRAVENOUS | Status: AC
  Filled 2021-01-19: qty 50

## 2021-01-19 MED ORDER — DIPHENHYDRAMINE HCL 50 MG/ML IJ SOLN
INTRAMUSCULAR | Status: AC
  Filled 2021-01-19: qty 1

## 2021-01-19 MED ORDER — BIMATOPROST 0.03 % EX SOLN: 1.0000 "application " | Freq: Every day | CUTANEOUS | 12 refills | Status: DC

## 2021-01-19 MED ORDER — SODIUM CHLORIDE 0.9 % IV SOLN
Freq: Once | INTRAVENOUS | Status: AC
  Filled 2021-01-19: qty 250

## 2021-01-19 NOTE — Patient Instructions (Signed)
[place] Discharge Instructions for Patients Receiving Chemotherapy  Today you received the following chemotherapy agents: Trastuzumab, Taxol  To help prevent nausea and vomiting after your treatment, we encourage you to take your nausea medication as directed.   If you develop nausea and vomiting that is not controlled by your nausea medication, call the clinic.   BELOW ARE SYMPTOMS THAT SHOULD BE REPORTED IMMEDIATELY:  *FEVER GREATER THAN 100.5 F  *CHILLS WITH OR WITHOUT FEVER  NAUSEA AND VOMITING THAT IS NOT CONTROLLED WITH YOUR NAUSEA MEDICATION  *UNUSUAL SHORTNESS OF BREATH  *UNUSUAL BRUISING OR BLEEDING  TENDERNESS IN MOUTH AND THROAT WITH OR WITHOUT PRESENCE OF ULCERS  *URINARY PROBLEMS  *BOWEL PROBLEMS  UNUSUAL RASH Items with * indicate a potential emergency and should be followed up as soon as possible.  Feel free to call the clinic should you have any questions or concerns. The clinic phone number is [phone].  Please show the [place] at check-in to the Emergency Department and triage nurse.

## 2021-01-19 NOTE — Progress Notes (Signed)
[facility] CSW Progress Note  Holiday representative met with patient to provide ongoing support. Patient reports doing fairly well today. She did not go to her friend's funeral but did grieve this weekend. She is celebrating finishing her regular chemo today and getting ready for radiation appt later this week.   CSW and patient spent time processing fears around recurrence and metastasis. Normalized fears and discussed ways to acknowledge it while also focusing on fully living her life. Discussed potential trigger times for increased anxiety and how to manage. Patient is interested in participating in [facility] once she completes treatment.    Christeen Douglas , LCSW

## 2021-01-19 NOTE — Patient Instructions (Signed)
Central Line, Adult A central line is a long, thin tube (catheter) that is put into a vein so that it goes to a large vein above your heart. It can be used to:  Give you medicine or fluids.  Give you food and nutrients.  Take blood or give you blood for testing or treatments. Types of central lines There are four main types of central lines:  Peripherally inserted central catheter (PICC) line. This type is usually put in the upper arm and goes up the arm to the heart.  Tunneled central line. This type is placed in a large vein in the neck, chest, or groin. It is tunneled under the skin and brought out through a second incision.  Non-tunneled central line. This type is used for a shorter time than other types, usually for 7 days at the most. It is inserted in the neck, chest, or groin.  Implanted port. This type can stay in place longer than other types of central lines. It is normally put in the upper chest but can also be placed in the upper arm or the belly. Surgery is needed to put it in and take it out. The type of central line you get will depend on how long you need it and your medical condition.   Tell a doctor about:  Any allergies you have.  All medicines you are taking. These include vitamins, herbs, eye drops, creams, and over-the-counter medicines.  Any problems you or family members have had with anesthetic medicines.  Any blood disorders you have.  Any surgeries you have had.  Any medical conditions you have.  Whether you are pregnant or may be pregnant. What are the risks? Generally, central lines are safe. However, problems may occur, including:  Infection.  A blood clot.  Bleeding from the place where the central line was inserted.  Getting a hole or crack in the central line. If this happens, the central line will need to be replaced.  Central line failure.  The catheter moving or coming out of place. What happens before the  procedure? Medicines  Ask your doctor about changing or stopping: ? Your normal medicines. ? Vitamins, herbs, and supplements. ? Over-the-counter medicines.  Do not take aspirin or ibuprofen unless you are told to. General instructions  Follow instructions from your doctor about eating or drinking.  For your safety, your doctor may: ? Mark the area of the procedure. ? Remove hair at the procedure site. ? Ask you to wash with a soap that kills germs.  Plan to have a responsible adult take you home from the hospital or clinic.  If you will be going home right after the procedure, plan to have a responsible adult care for you for the time you are told. This is important. What happens during the procedure?  An IV tube will be put into one of your veins.  You may be given: ? A sedative. This medicine helps you relax. ? Anesthetics. These medicines numb certain areas of your body.  Your skin will be cleaned with a germ-killing (antiseptic) solution. You may be covered with clean drapes.  Your blood pressure, heart rate, breathing rate, and blood oxygen level will be monitored during the procedure.  The central line will be put into the vein and moved through it to the correct spot. The doctor may use X-ray equipment to help guide the central line to the right place.  A bandage (dressing) will be placed over the insertion area.   The procedure may vary among doctors and hospitals. What can I expect after the procedure?  You will be monitored until you leave the hospital or clinic. This includes checking your blood pressure, heart rate, breathing rate, and blood oxygen level.  Caps may be placed on the ends of the central line tubing.  If you were given a sedative during your procedure, do not drive or use machines until your doctor says that it is safe. Follow these instructions at home: Caring for the tube  Follow instructions from your doctor about: ? Flushing the  tube. ? Cleaning the tube and the area around it.  Only use germ-free (sterile) supplies to flush. The supplies should be from your doctor, a pharmacy, or another place that your doctor recommends.  Before you flush the tube or clean the area around the tube: ? Wash your hands with soap and water for at least 20 seconds. If you cannot use soap and water, use hand sanitizer. ? Clean the central line hub with rubbing alcohol. To do this:  Scrub it using a twisting motion and rub for 10 to 15 seconds or for 30 twists. Follow the manufacturer's instructions.  Be sure you scrub the top of the hub, not just the sides. Never reuse alcohol pads.  Let the hub dry before use. Keep it from touching anything while drying.   Caring for your skin  Check the skin around the central line every day for signs of infection. Check for: ? Redness, swelling, or pain. ? Fluid or blood. ? Warmth. ? Pus or a bad smell.  Keep the area where the tube was put in clean and dry.  Change bandages only as told by your doctor.  Keep your bandage dry. If a bandage gets wet, have it changed right away. General instructions  Keep the tube clamped, unless it is being used.  If you or someone else accidentally pulls on the tube, make sure: ? The bandage is okay. ? There is no bleeding. ? The tube has not been pulled out.  Do not use scissors or sharp objects near the tube.  Do not take baths, swim, or use a hot tub until your doctor says it is okay. Ask your doctor if you may take showers. You may only be allowed to take sponge baths.  Ask your doctor what activities are safe for you. Your doctor may tell you not to lift anything or move your arm too much.  Take over-the-counter and prescription medicines only as told by your doctor.  Keep all follow-up visits. Storing and throwing away supplies  Keep your supplies in a clean, dry location.  Throw away any used syringes in a container that is only for  sharp items (sharps container). You can buy a sharps container from a pharmacy, or you can make one by using an empty hard plastic bottle with a cover.  Place any used bandages or infusion bags into a plastic bag. Throw that bag in the trash. Contact a doctor if:  You have any of these signs of infection where the tube was put in: ? Redness, swelling, or pain. ? Fluid or blood. ? Warmth. ? Pus or a bad smell. Get help right away if:  You have: ? A fever or chills. ? Shortness of breath. ? Pain in your chest. ? A fast heartbeat. ? Swelling in your neck, face, chest, or arm.  You feel dizzy or you faint.  There are red lines coming from   where the tube was put in.  The area where the tube was put in is bleeding and the bleeding will not stop.  Your tube is hard to flush.  You do not get a blood return from the tube.  The tube gets loose or comes out.  The tube has a hole or a tear.  The tube leaks. Summary  A central line is a long, thin tube (catheter) that is put in your vein. It can be used to give you medicine, food, or fluids.  Follow instructions from your doctor about flushing and cleaning the tube.  Keep the area where the tube was put in clean and dry.  Ask your doctor what activities are safe for you. This information is not intended to replace advice given to you by your health care provider. Make sure you discuss any questions you have with your health care provider. Document Revised: 07/24/2020 Document Reviewed: 07/24/2020 Elsevier Patient Education  2021 Elsevier Inc.  

## 2021-01-19 NOTE — Assessment & Plan Note (Signed)
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 Adjuvant radiation therapy followed by 3. Adjuvant antiestrogen therapywith anastrozole (patient is postmenopausal) and anti-HER-2 therapy with neratinib -------------------------------------------------------------------------------------------------------------------------------- Current treatment:cycle12Taxol-herceptin Echo: EF 60-65% Chemo toxicities: 1.Alopecia 2.Dry skin of the hand: Her fingers appear to be chapped especially the right number. She is using Aquaphor which is helping. 3.loss of eye lashes: Sent prescription for Latisse.  Denies neuropathy Has appts for XRT She will get herceptin maintenance q 3 weeks I will see her with labs every 6 weeks

## 2021-01-20 ENCOUNTER — Telehealth: Payer: Self-pay | Admitting: Hematology and Oncology

## 2021-01-20 NOTE — Progress Notes (Addendum)
Location of Breast Cancer: Primary malignant neoplasm of upper outer quadrant of RIGHT breast, estrogen receptor positive   Histology per Pathology Report:  10/16/2020 A. BREAST, RIGHT LUMPECTOMY CAVITY, RE-EXCISION:  - Lobular carcinoma in-situ  - Fibrocystic changes with apocrine metaplasia  - No invasive carcinoma identified  - Previous surgical site changes present  09/10/2020 FINAL MICROSCOPIC DIAGNOSIS:  A. BREAST, RIGHT, LUMPECTOMY:  - Invasive lobular carcinoma, 1.2 cm, Nottingham grade 2 of 3.  - Invasive carcinoma broadly involves the posterior margin.  - Biopsy site.  - See oncology table.  B. BREAST, RIGHT, ADDITIONAL SUPERIOR MARGIN, EXCISION:  - Breast tissue, negative for carcinoma.  C. SENTINEL LYMPH NODE, RIGHT AXILLARY, BIOPSY:  - One lymph node, negative for carcinoma (0/1).  D. SENTINEL LYMPH NODE, RIGHT AXILLARY, BIOPSY:  - Metastatic carcinoma in (1) of (1) lymph node.  Receptor Status: ER(80%), PR (60%), Her2-neu (Positive by FISH), Ki-67(15%)  Did patient present with symptoms (if so, please note symptoms) or was this found on screening mammography?:  Screening mammogram on 07/07/20 showed an asymmetry. Diagnostic mammogram and [place] on 07/29/20 showed a 0.8cm mass at the 10 o'clock position and no right axillary adenopathy  Past/Anticipated interventions by surgeon, if any: 10/16/2020 Dr. Erroll Luna Portacath Placement with C arm and ultrasound guidance and right breast reexcision lumpectomy  09/10/2020 Dr. Marcello Moores [facility] Right breast seed localized lumpectomy with right axillary sentinel lymph node mapping  Past/Anticipated interventions by medical oncology, if any:  Under care of Dr. Nicholas Lose 01/20/2020 --Recommendation: 1.Adjuvant chemotherapy with Taxol Herceptin followed by Herceptin maintenance for 1 year 2. Adjuvant radiation therapy followed by 3. Adjuvant antiestrogen therapywith anastrozole (patient is postmenopausal) and  anti-HER-2 therapy with neratinib --Current treatment:cycle12Taxol (final cycle 01/19/2021)-herceptin Echo: EF 60-65% --Chemo toxicities: 1.Alopecia 2.Dry skin of the hand: Her fingers appear to be chapped especially the right number. She is using Aquaphor which is helping. 3.loss of eye lashes: Sent prescription for Latisse. --Denies neuropathy --Has appts for XRT --She will get herceptin maintenance q 3 weeks --I will see her with labs every 6 weeks  Lymphedema issues, if any:  Patient denies  Pain issues, if any:  Patient denies   SAFETY ISSUES:  Prior radiation? No  Pacemaker/ICD? No  Possible current pregnancy? No--abdominal hysterectomy   Is the patient on methotrexate? No  Current Complaints / other details:  Patient has received both Moderna vaccines as well as Moderna booster, and annual flu shot

## 2021-01-20 NOTE — Telephone Encounter (Signed)
Scheduled per 2/14 los. Pt will receive an updated appt calendar at next visit per appt notes

## 2021-01-21 ENCOUNTER — Encounter: Payer: Self-pay | Admitting: Radiation Oncology

## 2021-01-21 ENCOUNTER — Ambulatory Visit
Admission: RE | Admit: 2021-01-21 | Discharge: 2021-01-21 | Disposition: A | Discharge status: Home / Self Care | Payer: Medicaid Other | Source: Ambulatory Visit | Attending: Radiation Oncology | Admitting: Radiation Oncology

## 2021-01-21 ENCOUNTER — Other Ambulatory Visit: Payer: Self-pay

## 2021-01-21 VITALS — BP 124/73 | HR 78 | Temp 97.2°F | Resp 18 | Ht 63.0 in | Wt 192.4 lb

## 2021-01-21 DIAGNOSIS — C773 Secondary and unspecified malignant neoplasm of axilla and upper limb lymph nodes: Secondary | ICD-10-CM | POA: Insufficient documentation

## 2021-01-21 DIAGNOSIS — C50411 Malignant neoplasm of upper-outer quadrant of right female breast: Secondary | ICD-10-CM | POA: Diagnosis not present

## 2021-01-21 DIAGNOSIS — Z79899 Other long term (current) drug therapy: Secondary | ICD-10-CM | POA: Diagnosis not present

## 2021-01-21 DIAGNOSIS — Z170 Estrogen receptor positive status [ER+]: Secondary | ICD-10-CM | POA: Diagnosis not present

## 2021-01-21 DIAGNOSIS — Z510 Encounter for antineoplastic radiation therapy: Secondary | ICD-10-CM | POA: Diagnosis not present

## 2021-01-21 NOTE — Progress Notes (Signed)
Radiation Oncology         [phone]) [phone] ________________________________  Name: Monica [name] MRN: [id number]  Date: 01/21/2021  DOB: 13-Apr-1971  Follow-Up Visit Note  Outpatient  CC: Muse, Linwood Dibbles D., PA-C  Nicholas Lose, MD  Diagnosis:      ICD-10-CM   1. Malignant neoplasm of upper-outer quadrant of right breast in female, estrogen receptor positive ([facility])  C50.411    Z17.0    pT1c, pN1a   CHIEF COMPLAINT: Here to discuss management of right breast cancer  Narrative:  The patient returns today for follow-up. She was seen in consultation on 08/26/2020.   Since consultation date, she underwent the following imaging (dates and results as follows): 1. MRI of bilateral breasts on 09/01/2020 that showed the 1 cm biopsy-proven upper outer quadrant breast malignancy. There was no other evidence of breast malignancy or abnormal appearing lymph nodes.  Breast/nodal surgery on the date of 09/10/2020 revealed: tumor size of 1.2 cm; histology of invasive lobular carcinoma; margin status to invasive disease showed broad involvement of the posterior margin; nodal status of positive in one out of two right axillary sentinel lymph nodes; ER status: 80% strong; PR status: 60% strong; Her2 status: positive; Grade: 2.  Of note, the patient underwent a right breast marginal re-excision on the date of 10/16/2020 that showed lobular carcinoma in-situ and fibrocystic changes with apocrine metaplasia. No invasive carcinoma was identified.  Systemic therapy, if applicable, involved (dates and therapy as follows): Twelve cycles of Taxol and Herceptin from 11/03/2020 - 01/19/2021 under the care of Dr. Lindi Adie.     Lymphedema issues, if any:  Patient denies  Pain issues, if any:  Patient denies   SAFETY ISSUES:  Prior radiation? No  Pacemaker/ICD? No  Possible current pregnancy? No--abdominal hysterectomy   Is the patient on methotrexate? No  Current Complaints / other details:   Patient has received both Moderna vaccines as well as Moderna booster, and annual flu shot           ALLERGIES:  has No Known Allergies.  Meds: Current Outpatient Medications  Medication Sig Dispense Refill  . albuterol (VENTOLIN HFA) 108 (90 Base) MCG/ACT inhaler Inhale into the lungs every 6 (six) hours as needed for wheezing or shortness of breath.    Marland Kitchen albuterol (VENTOLIN HFA) 108 (90 Base) MCG/ACT inhaler Inhale 2 puffs into the lungs every 4 (four) hours as needed for wheezing or shortness of breath. 8 g 3  . Cholecalciferol (VITAMIN D3) 125 MCG (5000 UT) TABS Take 1 tablet by mouth daily.    Marland Kitchen lidocaine-prilocaine (EMLA) cream Apply to affected area once 30 g 3  . lisinopril (ZESTRIL) 10 MG tablet Take 1 tablet (10 mg total) by mouth daily.    . montelukast (SINGULAIR) 10 MG tablet Take 10 mg by mouth daily.    . ondansetron (ZOFRAN) 8 MG tablet Take 1 tablet (8 mg total) by mouth 2 (two) times daily as needed (Nausea or vomiting). 30 tablet 1  . ciprofloxacin (CILOXAN) 0.3 % ophthalmic solution Place 1 drop into both eyes every 2 (two) hours. Administer 1 drop, every 2 hours, while awake, for 2 days. Then 1 drop, every 4 hours, while awake, for the next 5 days. (Patient not taking: Reported on 01/21/2021) 10 mL 0   No current facility-administered medications for this encounter.    Physical Findings:  height is 5' 3" (1.6 m) and weight is 192 lb 6 oz (87.3 kg). Her temporal temperature is 97.2 F (36.2  C) (abnormal). Her blood pressure is 124/73 and her pulse is 78. Her respiration is 18 and oxygen saturation is 98%. .     General: Alert and oriented, in no acute distress HEENT: Head is normocephalic.   MSK:  Good ROM in shoulders, ambulatory  Psychiatric: Judgment and insight are intact. Affect is appropriate. Breast exam reveals satisfactory healing of upper outer quadrant scar, right breast  Lab Findings: Lab Results  Component Value Date   WBC 7.8 01/19/2021   HGB 11.5  (L) 01/19/2021   HCT 32.5 (L) 01/19/2021   MCV 79.5 (L) 01/19/2021   PLT 266 01/19/2021    _0 @  Radiographic Findings: ECHOCARDIOGRAM COMPLETE  Result Date: 01/16/2021    ECHOCARDIOGRAM REPORT   Patient Name:   [name] [facility] Date of Exam: 01/16/2021 Medical Rec #:  [id number]                Height:       63.0 in Accession #:    [id number]               Weight:       191.8 lb Date of Birth:  1971/04/21                BSA:          1.900 m Patient Age:    50 years                 BP:           127/83 mmHg Patient Gender: F                        HR:           84 bpm. Exam Location:  Outpatient Procedure: 2D Echo, Cardiac Doppler and Color Doppler Indications:    Chemo Z09  History:        Patient has prior history of Echocardiogram examinations, most                 recent 09/26/2020. Risk Factors:Hypertension.  Sonographer:    Bernadene Person RDCS Referring Phys: [id number] Nicholas Lose IMPRESSIONS  1. Left ventricular ejection fraction, by estimation, is 65 to 70%. The left ventricle has normal function. The left ventricle has no regional wall motion abnormalities. Left ventricular diastolic parameters were normal. The average left ventricular global longitudinal strain is -22.1 %. The global longitudinal strain is normal.  2. Right ventricular systolic function is normal. The right ventricular size is normal.  3. The mitral valve is normal in structure. Trivial mitral valve regurgitation. No evidence of mitral stenosis.  4. The aortic valve is normal in structure. Aortic valve regurgitation is not visualized. No aortic stenosis is present.  5. The inferior vena cava is normal in size with greater than 50% respiratory variability, suggesting right atrial pressure of 3 mmHg. Comparison(s): No significant change from prior study. Prior images reviewed side by side. FINDINGS  Left Ventricle: Left ventricular ejection fraction, by estimation, is 65 to 70%. The left ventricle has normal  function. The left ventricle has no regional wall motion abnormalities. The average left ventricular global longitudinal strain is -22.1 %. The global longitudinal strain is normal. The left ventricular internal cavity size was normal in size. There is no left ventricular hypertrophy. Left ventricular diastolic parameters were normal. Normal left ventricular filling pressure. Right Ventricle: The right ventricular size is normal. No increase in right ventricular wall thickness. Right ventricular systolic  function is normal. Left Atrium: Left atrial size was normal in size. Right Atrium: Right atrial size was normal in size. Pericardium: There is no evidence of pericardial effusion. Mitral Valve: The mitral valve is normal in structure. Trivial mitral valve regurgitation. No evidence of mitral valve stenosis. Tricuspid Valve: The tricuspid valve is normal in structure. Tricuspid valve regurgitation is not demonstrated. No evidence of tricuspid stenosis. Aortic Valve: The aortic valve is normal in structure. Aortic valve regurgitation is not visualized. No aortic stenosis is present. Pulmonic Valve: The pulmonic valve was normal in structure. Pulmonic valve regurgitation is trivial. No evidence of pulmonic stenosis. Aorta: The aortic root is normal in size and structure. Venous: The inferior vena cava is normal in size with greater than 50% respiratory variability, suggesting right atrial pressure of 3 mmHg. IAS/Shunts: No atrial level shunt detected by color flow Doppler.  LEFT VENTRICLE PLAX 2D LVIDd:         4.40 cm  Diastology LVIDs:         2.70 cm  LV e' medial:    6.64 cm/s LV PW:         0.80 cm  LV E/e' medial:  9.4 LV IVS:        0.70 cm  LV e' lateral:   11.00 cm/s LVOT diam:     1.90 cm  LV E/e' lateral: 5.7 LV SV:         60 LV SV Index:   31       2D Longitudinal Strain LVOT Area:     2.84 cm 2D Strain GLS Avg:     -22.1 %  RIGHT VENTRICLE RV S prime:     16.40 cm/s TAPSE (M-mode): 1.8 cm LEFT ATRIUM              Index       RIGHT ATRIUM          Index LA diam:        3.10 cm 1.63 cm/m  RA Area:     9.68 cm LA Vol (A2C):   27.8 ml 14.64 ml/m RA Volume:   19.40 ml 10.21 ml/m LA Vol (A4C):   31.9 ml 16.79 ml/m LA Biplane Vol: 30.6 ml 16.11 ml/m  AORTIC VALVE LVOT Vmax:   134.00 cm/s LVOT Vmean:  98.700 cm/s LVOT VTI:    0.210 m  AORTA Ao Root diam: 2.90 cm Ao Asc diam:  3.00 cm MITRAL VALVE MV Area (PHT): 2.56 cm    SHUNTS MV Decel Time: 296 msec    Systemic VTI:  0.21 m MV E velocity: 62.40 cm/s  Systemic Diam: 1.90 cm MV A velocity: 69.90 cm/s MV E/A ratio:  0.89 Mihai Croitoru MD Electronically signed by Sanda Klein MD Signature Date/Time: 01/16/2021/10:49:15 AM    Final     Impression/Plan: Right breast cancer  We discussed adjuvant radiotherapy today.  I recommend a 6-week course of radiation therapy to the right breast and regional nodes in order to reduce risk of local regional recurrence by two thirds.  I reviewed the logistics, benefits, risks, and potential side effects of this treatment in detail. Risks may include but not necessary be limited to acute and late injury tissue in the radiation fields such as skin irritation (change in color/pigmentation, itching, dryness, pain, peeling). She may experience fatigue. We also discussed possible risk of long term cosmetic changes or scar tissue. There is also a smaller risk for lung toxicity, brachial plexopathy, lymphedema, musculoskeletal changes, rib  fragility or induction of a second malignancy, late chronic non-healing soft tissue wound.    The patient asked good questions which I answered to her satisfaction. She is enthusiastic about proceeding with treatment. A consent form has been  signed and placed in her chart.  She will proceed with CT simulation today.  We will start her treatment on March 7 to allow her time to recover from completion of chemotherapy.   On date of service, in total, I spent 30 minutes on this encounter. Patient  was seen in person.  _____________________________________   Eppie Gibson, MD  This document serves as a record of services personally performed by Eppie Gibson, MD. It was created on his behalf by Clerance Lav, a trained medical scribe. The creation of this record is based on the scribe's personal observations and the provider's statements to them. This document has been checked and approved by the attending provider.

## 2021-01-23 DIAGNOSIS — Z510 Encounter for antineoplastic radiation therapy: Secondary | ICD-10-CM | POA: Diagnosis not present

## 2021-01-26 ENCOUNTER — Encounter: Payer: Self-pay | Admitting: *Deleted

## 2021-02-09 ENCOUNTER — Inpatient Hospital Stay: Payer: Medicaid Other | Attending: Hematology and Oncology

## 2021-02-09 ENCOUNTER — Other Ambulatory Visit: Payer: Self-pay

## 2021-02-09 ENCOUNTER — Ambulatory Visit
Admission: RE | Admit: 2021-02-09 | Discharge: 2021-02-09 | Disposition: A | Discharge status: Home / Self Care | Payer: Medicaid Other | Source: Ambulatory Visit | Attending: Radiation Oncology | Admitting: Radiation Oncology

## 2021-02-09 VITALS — BP 136/85 | HR 74 | Temp 98.2°F | Resp 18 | Wt 196.0 lb

## 2021-02-09 DIAGNOSIS — Z5112 Encounter for antineoplastic immunotherapy: Secondary | ICD-10-CM | POA: Insufficient documentation

## 2021-02-09 DIAGNOSIS — C50411 Malignant neoplasm of upper-outer quadrant of right female breast: Secondary | ICD-10-CM | POA: Insufficient documentation

## 2021-02-09 DIAGNOSIS — Z170 Estrogen receptor positive status [ER+]: Secondary | ICD-10-CM | POA: Insufficient documentation

## 2021-02-09 DIAGNOSIS — C773 Secondary and unspecified malignant neoplasm of axilla and upper limb lymph nodes: Secondary | ICD-10-CM | POA: Diagnosis present

## 2021-02-09 MED ORDER — DIPHENHYDRAMINE HCL 25 MG PO CAPS
50.0000 mg | ORAL_CAPSULE | Freq: Once | ORAL | Status: AC
  Administered 2021-02-09: 50 mg via ORAL

## 2021-02-09 MED ORDER — SODIUM CHLORIDE 0.9 % IV SOLN
Freq: Once | INTRAVENOUS | Status: AC
  Filled 2021-02-09: qty 250

## 2021-02-09 MED ORDER — ALRA NON-METALLIC DEODORANT (RAD-ONC)
1.0000 "application " | Freq: Once | TOPICAL | Status: AC
  Administered 2021-02-09: 1 via TOPICAL

## 2021-02-09 MED ORDER — SODIUM CHLORIDE 0.9 % IV SOLN
6.0000 mg/kg | Freq: Once | INTRAVENOUS | Status: AC
  Administered 2021-02-09: 504 mg via INTRAVENOUS
  Filled 2021-02-09: qty 24

## 2021-02-09 MED ORDER — ACETAMINOPHEN 325 MG PO TABS
650.0000 mg | ORAL_TABLET | Freq: Once | ORAL | Status: AC
  Administered 2021-02-09: 650 mg via ORAL

## 2021-02-09 MED ORDER — RADIAPLEXRX EX GEL: Freq: Once | CUTANEOUS | Status: AC

## 2021-02-09 NOTE — Progress Notes (Signed)

## 2021-02-09 NOTE — Patient Instructions (Signed)
[facility] °Discharge Instructions for Patients Receiving Chemotherapy ° °Today you received the following chemotherapy agents Trastuzumab ° °To help prevent nausea and vomiting after your treatment, we encourage you to take your nausea medication as directed. °  °If you develop nausea and vomiting that is not controlled by your nausea medication, call the clinic.  ° °BELOW ARE SYMPTOMS THAT SHOULD BE REPORTED IMMEDIATELY: °· *FEVER GREATER THAN 100.5 F °· *CHILLS WITH OR WITHOUT FEVER °· NAUSEA AND VOMITING THAT IS NOT CONTROLLED WITH YOUR NAUSEA MEDICATION °· *UNUSUAL SHORTNESS OF BREATH °· *UNUSUAL BRUISING OR BLEEDING °· TENDERNESS IN MOUTH AND THROAT WITH OR WITHOUT PRESENCE OF ULCERS °· *URINARY PROBLEMS °· *BOWEL PROBLEMS °· UNUSUAL RASH °Items with * indicate a potential emergency and should be followed up as soon as possible. ° °Feel free to call the clinic should you have any questions or concerns. The clinic phone number is [phone]. ° °Please show the CHEMO ALERT CARD at check-in to the Emergency Department and triage nurse. ° ° °

## 2021-02-10 ENCOUNTER — Ambulatory Visit
Admission: RE | Admit: 2021-02-10 | Discharge: 2021-02-10 | Disposition: A | Discharge status: Home / Self Care | Payer: Medicaid Other | Source: Ambulatory Visit | Attending: Radiation Oncology | Admitting: Radiation Oncology

## 2021-02-10 ENCOUNTER — Other Ambulatory Visit: Payer: Self-pay

## 2021-02-10 DIAGNOSIS — C50411 Malignant neoplasm of upper-outer quadrant of right female breast: Secondary | ICD-10-CM | POA: Diagnosis not present

## 2021-02-11 ENCOUNTER — Other Ambulatory Visit: Payer: Self-pay

## 2021-02-11 ENCOUNTER — Ambulatory Visit
Admission: RE | Admit: 2021-02-11 | Discharge: 2021-02-11 | Disposition: A | Discharge status: Home / Self Care | Payer: Medicaid Other | Source: Ambulatory Visit | Attending: Radiation Oncology | Admitting: Radiation Oncology

## 2021-02-11 DIAGNOSIS — C50411 Malignant neoplasm of upper-outer quadrant of right female breast: Secondary | ICD-10-CM | POA: Diagnosis not present

## 2021-02-12 ENCOUNTER — Ambulatory Visit
Admission: RE | Admit: 2021-02-12 | Discharge: 2021-02-12 | Disposition: A | Discharge status: Home / Self Care | Payer: Medicaid Other | Source: Ambulatory Visit | Attending: Radiation Oncology | Admitting: Radiation Oncology

## 2021-02-12 ENCOUNTER — Other Ambulatory Visit: Payer: Self-pay

## 2021-02-12 DIAGNOSIS — C50411 Malignant neoplasm of upper-outer quadrant of right female breast: Secondary | ICD-10-CM | POA: Diagnosis not present

## 2021-02-13 ENCOUNTER — Other Ambulatory Visit: Payer: Self-pay

## 2021-02-13 ENCOUNTER — Ambulatory Visit
Admission: RE | Admit: 2021-02-13 | Discharge: 2021-02-13 | Disposition: A | Discharge status: Home / Self Care | Payer: Medicaid Other | Source: Ambulatory Visit | Attending: Radiation Oncology | Admitting: Radiation Oncology

## 2021-02-13 DIAGNOSIS — C50411 Malignant neoplasm of upper-outer quadrant of right female breast: Secondary | ICD-10-CM | POA: Diagnosis not present

## 2021-02-16 ENCOUNTER — Ambulatory Visit
Admission: RE | Admit: 2021-02-16 | Discharge: 2021-02-16 | Disposition: A | Discharge status: Home / Self Care | Payer: Medicaid Other | Source: Ambulatory Visit | Attending: Radiation Oncology | Admitting: Radiation Oncology

## 2021-02-16 DIAGNOSIS — C50411 Malignant neoplasm of upper-outer quadrant of right female breast: Secondary | ICD-10-CM | POA: Diagnosis not present

## 2021-02-17 ENCOUNTER — Ambulatory Visit
Admission: RE | Admit: 2021-02-17 | Discharge: 2021-02-17 | Disposition: A | Discharge status: Home / Self Care | Payer: Medicaid Other | Source: Ambulatory Visit | Attending: Radiation Oncology | Admitting: Radiation Oncology

## 2021-02-17 ENCOUNTER — Other Ambulatory Visit: Payer: Self-pay

## 2021-02-17 DIAGNOSIS — C50411 Malignant neoplasm of upper-outer quadrant of right female breast: Secondary | ICD-10-CM | POA: Diagnosis not present

## 2021-02-18 ENCOUNTER — Ambulatory Visit
Admission: RE | Admit: 2021-02-18 | Discharge: 2021-02-18 | Disposition: A | Discharge status: Home / Self Care | Payer: Medicaid Other | Source: Ambulatory Visit | Attending: Radiation Oncology | Admitting: Radiation Oncology

## 2021-02-18 ENCOUNTER — Other Ambulatory Visit: Payer: Self-pay

## 2021-02-18 DIAGNOSIS — C50411 Malignant neoplasm of upper-outer quadrant of right female breast: Secondary | ICD-10-CM | POA: Diagnosis not present

## 2021-02-19 ENCOUNTER — Ambulatory Visit
Admission: RE | Admit: 2021-02-19 | Discharge: 2021-02-19 | Disposition: A | Discharge status: Home / Self Care | Payer: Medicaid Other | Source: Ambulatory Visit | Attending: Radiation Oncology | Admitting: Radiation Oncology

## 2021-02-19 DIAGNOSIS — C50411 Malignant neoplasm of upper-outer quadrant of right female breast: Secondary | ICD-10-CM | POA: Diagnosis not present

## 2021-02-20 ENCOUNTER — Ambulatory Visit
Admission: RE | Admit: 2021-02-20 | Discharge: 2021-02-20 | Disposition: A | Discharge status: Home / Self Care | Payer: Medicaid Other | Source: Ambulatory Visit | Attending: Radiation Oncology | Admitting: Radiation Oncology

## 2021-02-20 DIAGNOSIS — C50411 Malignant neoplasm of upper-outer quadrant of right female breast: Secondary | ICD-10-CM | POA: Diagnosis not present

## 2021-02-23 ENCOUNTER — Ambulatory Visit
Admission: RE | Admit: 2021-02-23 | Discharge: 2021-02-23 | Disposition: A | Discharge status: Home / Self Care | Payer: Medicaid Other | Source: Ambulatory Visit | Attending: Radiation Oncology | Admitting: Radiation Oncology

## 2021-02-23 ENCOUNTER — Other Ambulatory Visit: Payer: Self-pay

## 2021-02-23 DIAGNOSIS — C50411 Malignant neoplasm of upper-outer quadrant of right female breast: Secondary | ICD-10-CM | POA: Diagnosis not present

## 2021-02-24 ENCOUNTER — Ambulatory Visit
Admission: RE | Admit: 2021-02-24 | Discharge: 2021-02-24 | Disposition: A | Discharge status: Home / Self Care | Payer: Medicaid Other | Source: Ambulatory Visit | Attending: Radiation Oncology | Admitting: Radiation Oncology

## 2021-02-24 ENCOUNTER — Telehealth: Payer: Self-pay | Admitting: Licensed Clinical Social Worker

## 2021-02-24 DIAGNOSIS — C50411 Malignant neoplasm of upper-outer quadrant of right female breast: Secondary | ICD-10-CM | POA: Diagnosis not present

## 2021-02-24 NOTE — Telephone Encounter (Signed)
[place] CSW  Patient called CSW with update regarding [employer] application- patient needs to submit other bills and needs assistance. Patient will bring to CSW to help submit.  Otherwise, patient has been very fatigued from radiation treatments. CSW offered supportive counsel around the difficulties involved with this.   Christeen Douglas , LCSW

## 2021-02-25 ENCOUNTER — Ambulatory Visit: Payer: Medicaid Other

## 2021-02-26 ENCOUNTER — Ambulatory Visit
Admission: RE | Admit: 2021-02-26 | Discharge: 2021-02-26 | Disposition: A | Discharge status: Home / Self Care | Payer: Medicaid Other | Source: Ambulatory Visit | Attending: Radiation Oncology | Admitting: Radiation Oncology

## 2021-02-26 ENCOUNTER — Other Ambulatory Visit: Payer: Self-pay

## 2021-02-26 DIAGNOSIS — C50411 Malignant neoplasm of upper-outer quadrant of right female breast: Secondary | ICD-10-CM | POA: Diagnosis not present

## 2021-02-27 ENCOUNTER — Ambulatory Visit
Admission: RE | Admit: 2021-02-27 | Discharge: 2021-02-27 | Disposition: A | Discharge status: Home / Self Care | Payer: Medicaid Other | Source: Ambulatory Visit | Attending: Radiation Oncology | Admitting: Radiation Oncology

## 2021-02-27 DIAGNOSIS — C50411 Malignant neoplasm of upper-outer quadrant of right female breast: Secondary | ICD-10-CM | POA: Diagnosis not present

## 2021-03-01 NOTE — Assessment & Plan Note (Signed)
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 Adjuvant radiation therapy 02/10/21- 03/02/21 3. Adjuvant antiestrogen therapywith anastrozole (patient is postmenopausal) and anti-HER-2 therapy with neratinib --------------------------------------------------------------------------------------------------------------------------------  She will get herceptin maintenance q 3 weeks Start Anti estrogen therapy  I will see her with labs every 6 weeks

## 2021-03-01 NOTE — Progress Notes (Signed)
Patient Care Team: Raiford Simmonds., PA-C as PCP - General  DIAGNOSIS:    ICD-10-CM   1. Primary malignant neoplasm of upper outer quadrant of female breast, right ([place])  C50.411     SUMMARY OF ONCOLOGIC HISTORY: Oncology History  Primary malignant neoplasm of upper outer quadrant of female breast, right ([place])  08/12/2020 Initial Diagnosis   Screening mammogram on 07/07/20 showed an asymmetry. Diagnostic mammogram and [place] on 07/29/20 showed a 0.8cm mass at the 10 o'clock position and no right axillary adenopathy. Biopsy on 08/12/20 showed invasive and in situ mammary carcinoma, grade 2, HER-2 equivocal by IHC, positive by FISH, ER+ 80%, PR+ 60%, Ki67 15%.   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  Variants of uncertain significance detected in ATM (c.131A>G (p.Asp44Gly) and c.4658A>C (p.Glu1553Ala)) and WRN (c.2825+6G>T (intronic)).  The report date is October 02, 2020.   The Multi-Cancer Panel offered by [facility] includes sequencing and/or deletion duplication testing of the following 85 genes: AIP, ALK, APC, ATM, AXIN2,BAP1,  BARD1, BLM, BMPR1A, BRCA1, BRCA2, BRIP1, CASR, CDC73, CDH1, CDK4, CDKN1B, CDKN1C, CDKN2A (p14ARF), CDKN2A (p16INK4a), CEBPA, CHEK2, CTNNA1, DICER1, DIS3L2, EGFR (c.2369C>T, p.Thr790Met variant only), EPCAM (Deletion/duplication testing only), FH, FLCN, GATA2,  GPC3, GREM1 (Promoter region deletion/duplication testing only), HOXB13 (c.251G>A, p.Gly84Glu), HRAS, KIT, MAX, MEN1, MET, MITF (c.952G>A, p.Glu318Lys variant only), MLH1, MSH2, MSH3, MSH6, MUTYH, NBN, NF1, NF2, NTHL1, PALB2, PDGFRA, PHOX2B, PMS2, POLD1, POLE, POT1, PRKAR1A, PTCH1, PTEN, RAD50, RAD51C, RAD51D, RB1, RECQL4, RET, RNF43, RUNX1, SDHAF2, SDHA (sequence changes only), SDHB, SDHC, SDHD, SMAD4, SMARCA4, SMARCB1, SMARCE1, STK11, SUFU, TERC, TERT, TMEM127, TP53, TSC1, TSC2, VHL, WRN and WT1.   Amended report: The variant of uncertain significance (VUS) in [place] at  c.2825+6G>T (Intronic) has been reclassified to likely benign.  The change in variant classification was made as a result of re-review of evidence in light of new variant interpretation guidelines and/or new information. The amended report date is December 18, 2020.    02/10/2021 -  Radiation Therapy   Adjuvant radiation     CHIEF COMPLIANT: Follow-up to discuss antiestrogen therapy  INTERVAL HISTORY: Monica [name] is a 50 y.o. with above-mentioned history of right breast cancerwhounderwent a right lumpectomyfollowed by re-excision, adjuvant chemotherapy, and is currently undergoing radiation. She presents to the clinic today to discuss antiestrogen therapy.   ALLERGIES:  has No Known Allergies.  MEDICATIONS:  Current Outpatient Medications  Medication Sig Dispense Refill  . albuterol (VENTOLIN HFA) 108 (90 Base) MCG/ACT inhaler Inhale into the lungs every 6 (six) hours as needed for wheezing or shortness of breath.    Marland Kitchen albuterol (VENTOLIN HFA) 108 (90 Base) MCG/ACT inhaler Inhale 2 puffs into the lungs every 4 (four) hours as needed for wheezing or shortness of breath. 8 g 3  . Cholecalciferol (VITAMIN D3) 125 MCG (5000 UT) TABS Take 1 tablet by mouth daily.    . ciprofloxacin (CILOXAN) 0.3 %  ophthalmic solution Place 1 drop into both eyes every 2 (two) hours. Administer 1 drop, every 2 hours, while awake, for 2  days. Then 1 drop, every 4 hours, while awake, for the next 5 days. (Patient not taking: Reported on 01/21/2021) 10 mL 0  . lidocaine-prilocaine (EMLA) cream Apply to affected area once 30 g 3  . lisinopril (ZESTRIL) 10 MG tablet Take 1 tablet (10 mg total) by mouth daily.    . montelukast (SINGULAIR) 10 MG tablet Take 10 mg by mouth daily.    . ondansetron (ZOFRAN) 8 MG tablet Take 1 tablet (8 mg total) by mouth 2 (two) times daily as needed (Nausea or vomiting). 30 tablet 1   No current facility-administered medications for this visit.    PHYSICAL EXAMINATION: ECOG PERFORMANCE STATUS: 1 - Symptomatic but completely ambulatory  Vitals:   03/02/21 0920  BP: 120/88  Pulse: 73  Resp: 20  Temp: 97.7 F (36.5 C)  SpO2: 99%   Filed Weights   03/02/21 0920  Weight: 193 lb 1.6 oz (87.6 kg)    LABORATORY DATA:  I have reviewed the data as listed CMP Latest Ref Rng & Units 01/19/2021 01/12/2021 01/05/2021  Glucose 70 - 99 mg/dL 100(H) 89 85  BUN 6 - 20 mg/dL _0 Creatinine 0.44 - 1.00 mg/dL 0.82 0.86 0.82  Sodium 135 - 145 mmol/L 135 136 140  Potassium 3.5 - 5.1 mmol/L 4.2 4.1 4.4  Chloride 98 - 111 mmol/L 109 107 108  CO2 22 - 32 mmol/L _1 Calcium 8.9 - 10.3 mg/dL 9.1 9.1 9.1  Total Protein 6.5 - 8.1 g/dL 7.1 7.2 6.8  Total Bilirubin 0.3 - 1.2 mg/dL 0.4 0.5 0.4  Alkaline Phos 38 - 126 U/L 96 82 93  AST 15 - 41 U/L _2 ALT 0 - 44 U/L _3 Lab Results  Component Value Date   WBC 8.7 03/02/2021   HGB 12.4 03/02/2021   HCT 35.2 (L) 03/02/2021   MCV 77.7 (L) 03/02/2021   PLT 253 03/02/2021   NEUTROABS 6.0 03/02/2021    ASSESSMENT & PLAN:  Primary malignant neoplasm of upper outer quadrant of female breast, right ([place]) 09/10/2020:Right lumpectomy ([facility]): invasive lobular carcinoma, 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ER 80%, PR 60%, Ki-67 15%, HER-2 equivocal by IHC positive for  fish 10/11/20: Re-excision: Neg  Recommendation: 1.Adjuvant chemotherapy with Taxol Herceptin followed by Herceptin maintenance for 1 year 2. Adjuvant radiation therapy 02/10/21- 03/02/21 3. Adjuvant antiestrogen therapywith anastrozole (patient is postmenopausal) and anti-HER-2 therapy with neratinib --------------------------------------------------------------------------------------------------------------------------------  She will get herceptin maintenance q 3 weeks Start Anti estrogen therapy after radiation is complete  Radiation toxicities: Profound fatigue Labs reviewed: Hemoglobin is normalized as well as the rest of the CBC.  Slight microcytosis.  She will increase iron-containing foods in diet.  I will see her with labs every 6 weeks    No orders of the defined types were placed in this encounter.  The patient has a good understanding of the overall plan. she agrees with it. she will call with any problems that may develop before the next visit here.  Total time spent: 30 mins including face to face time and time spent for planning, charting and coordination of care  Rulon Eisenmenger, MD, MPH 03/02/2021  I, Cloyde Reams Dorshimer, am acting as scribe for Dr. Nicholas Lose.  I have reviewed the above documentation  for accuracy and completeness, and I agree with the above.

## 2021-03-02 ENCOUNTER — Ambulatory Visit
Admission: RE | Admit: 2021-03-02 | Discharge: 2021-03-02 | Disposition: A | Discharge status: Home / Self Care | Payer: Medicaid Other | Source: Ambulatory Visit | Attending: Radiation Oncology | Admitting: Radiation Oncology

## 2021-03-02 ENCOUNTER — Encounter: Payer: Self-pay | Admitting: Licensed Clinical Social Worker

## 2021-03-02 ENCOUNTER — Inpatient Hospital Stay: Payer: Medicaid Other

## 2021-03-02 ENCOUNTER — Inpatient Hospital Stay (HOSPITAL_BASED_OUTPATIENT_CLINIC_OR_DEPARTMENT_OTHER): Payer: Medicaid Other | Admitting: Hematology and Oncology

## 2021-03-02 ENCOUNTER — Other Ambulatory Visit: Payer: Self-pay

## 2021-03-02 DIAGNOSIS — C50411 Malignant neoplasm of upper-outer quadrant of right female breast: Secondary | ICD-10-CM

## 2021-03-02 DIAGNOSIS — Z5112 Encounter for antineoplastic immunotherapy: Secondary | ICD-10-CM | POA: Diagnosis not present

## 2021-03-02 LAB — CMP (CANCER CENTER ONLY)
ALT: 14 U/L (ref 0–44)
AST: 22 U/L (ref 15–41)
Albumin: 3.8 g/dL (ref 3.5–5.0)
Alkaline Phosphatase: 116 U/L (ref 38–126)
Anion gap: 9 (ref 5–15)
BUN: 10 mg/dL (ref 6–20)
CO2: 23 mmol/L (ref 22–32)
Calcium: 9 mg/dL (ref 8.9–10.3)
Chloride: 107 mmol/L (ref 98–111)
Creatinine: 0.84 mg/dL (ref 0.44–1.00)
GFR, Estimated: >60 mL/min (ref >60)
Glucose, Bld: 96 mg/dL (ref 70–99)
Potassium: 4.3 mmol/L (ref 3.5–5.1)
Sodium: 139 mmol/L (ref 135–145)
Total Bilirubin: 0.4 mg/dL (ref 0.3–1.2)
Total Protein: 7.5 g/dL (ref 6.5–8.1)

## 2021-03-02 LAB — CBC WITH DIFFERENTIAL (CANCER CENTER ONLY)
Abs Immature Granulocytes: 0.02 10*3/uL (ref 0.00–0.07)
Basophils Absolute: 0.1 10*3/uL (ref 0.0–0.1)
Basophils Relative: 1 %
Eosinophils Absolute: 0.3 10*3/uL (ref 0.0–0.5)
Eosinophils Relative: 4 %
HCT: 35.2 % — ABNORMAL LOW (ref 36.0–46.0)
Hemoglobin: 12.4 g/dL (ref 12.0–15.0)
Immature Granulocytes: 0 %
Lymphocytes Relative: 17 %
Lymphs Abs: 1.5 10*3/uL (ref 0.7–4.0)
MCH: 27.4 pg (ref 26.0–34.0)
MCHC: 35.2 g/dL (ref 30.0–36.0)
MCV: 77.7 fL — ABNORMAL LOW (ref 80.0–100.0)
Monocytes Absolute: 0.9 10*3/uL (ref 0.1–1.0)
Monocytes Relative: 10 %
Neutro Abs: 6 10*3/uL (ref 1.7–7.7)
Neutrophils Relative %: 68 %
Platelet Count: 253 10*3/uL (ref 150–400)
RBC: 4.53 MIL/uL (ref 3.87–5.11)
RDW: 15 % (ref 11.5–15.5)
WBC Count: 8.7 10*3/uL (ref 4.0–10.5)
nRBC: 0 % (ref 0.0–0.2)

## 2021-03-02 MED ORDER — ACETAMINOPHEN 325 MG PO TABS
650.0000 mg | ORAL_TABLET | Freq: Once | ORAL | Status: AC
  Administered 2021-03-02: 650 mg via ORAL

## 2021-03-02 MED ORDER — DIPHENHYDRAMINE HCL 25 MG PO CAPS
50.0000 mg | ORAL_CAPSULE | Freq: Once | ORAL | Status: AC
  Administered 2021-03-02: 50 mg via ORAL

## 2021-03-02 MED ORDER — DIPHENHYDRAMINE HCL 25 MG PO CAPS
ORAL_CAPSULE | ORAL | Status: AC
  Filled 2021-03-02: qty 2

## 2021-03-02 MED ORDER — TRASTUZUMAB-DKST CHEMO 150 MG IV SOLR
6.0000 mg/kg | Freq: Once | INTRAVENOUS | Status: AC
  Administered 2021-03-02: 504 mg via INTRAVENOUS
  Filled 2021-03-02: qty 24

## 2021-03-02 MED ORDER — SODIUM CHLORIDE 0.9 % IV SOLN
Freq: Once | INTRAVENOUS | Status: AC
  Filled 2021-03-02: qty 250

## 2021-03-02 MED ORDER — HEPARIN SOD (PORK) LOCK FLUSH 100 UNIT/ML IV SOLN
500.0000 [IU] | Freq: Once | INTRAVENOUS | Status: AC | PRN
  Administered 2021-03-02: 500 [IU]
  Filled 2021-03-02: qty 5

## 2021-03-02 MED ORDER — ACETAMINOPHEN 325 MG PO TABS
ORAL_TABLET | ORAL | Status: AC
  Filled 2021-03-02: qty 2

## 2021-03-02 MED ORDER — SODIUM CHLORIDE 0.9% FLUSH
10.0000 mL | INTRAVENOUS | Status: DC | PRN
  Administered 2021-03-02: 10 mL
  Filled 2021-03-02: qty 10

## 2021-03-02 NOTE — Patient Instructions (Signed)
[facility] Discharge Instructions for Patients Receiving Chemotherapy  Today you received the following chemotherapy agents Traztuzumab  To help prevent nausea and vomiting after your treatment, we encourage you to take your nausea medication as directed.    If you develop nausea and vomiting that is not controlled by your nausea medication, call the clinic.   BELOW ARE SYMPTOMS THAT SHOULD BE REPORTED IMMEDIATELY:  *FEVER GREATER THAN 100.5 F  *CHILLS WITH OR WITHOUT FEVER  NAUSEA AND VOMITING THAT IS NOT CONTROLLED WITH YOUR NAUSEA MEDICATION  *UNUSUAL SHORTNESS OF BREATH  *UNUSUAL BRUISING OR BLEEDING  TENDERNESS IN MOUTH AND THROAT WITH OR WITHOUT PRESENCE OF ULCERS  *URINARY PROBLEMS  *BOWEL PROBLEMS  UNUSUAL RASH Items with * indicate a potential emergency and should be followed up as soon as possible.  Feel free to call the clinic should you have any questions or concerns. The clinic phone number is [phone].  Please show the CHEMO ALERT CARD at check-in to the Emergency Department and triage nurse.   

## 2021-03-02 NOTE — Patient Instructions (Signed)

## 2021-03-02 NOTE — Progress Notes (Signed)
[place] CSW Progress Note  Holiday representative met with patient in infusion. Provided ongoing supportive counseling, especially around frustrations with lack of energy and fatigue from radiation.  Patient also provided bills to have CSW scan & send to [facility] for consideration of assistance. Sent today.  CSW will continue to support patient through treatment.    Christeen Douglas , LCSW

## 2021-03-03 ENCOUNTER — Telehealth: Payer: Self-pay | Admitting: Hematology and Oncology

## 2021-03-03 ENCOUNTER — Ambulatory Visit
Admission: RE | Admit: 2021-03-03 | Discharge: 2021-03-03 | Disposition: A | Discharge status: Home / Self Care | Payer: Medicaid Other | Source: Ambulatory Visit | Attending: Radiation Oncology | Admitting: Radiation Oncology

## 2021-03-03 DIAGNOSIS — C50411 Malignant neoplasm of upper-outer quadrant of right female breast: Secondary | ICD-10-CM | POA: Diagnosis not present

## 2021-03-03 NOTE — Telephone Encounter (Signed)
Scheduled per 3/28 los. Pt will receive an updated appt calendar per next visit appt notes

## 2021-03-04 ENCOUNTER — Ambulatory Visit
Admission: RE | Admit: 2021-03-04 | Discharge: 2021-03-04 | Disposition: A | Discharge status: Home / Self Care | Payer: Medicaid Other | Source: Ambulatory Visit | Attending: Radiation Oncology | Admitting: Radiation Oncology

## 2021-03-04 ENCOUNTER — Other Ambulatory Visit: Payer: Self-pay

## 2021-03-04 DIAGNOSIS — C50411 Malignant neoplasm of upper-outer quadrant of right female breast: Secondary | ICD-10-CM | POA: Diagnosis not present

## 2021-03-05 ENCOUNTER — Other Ambulatory Visit: Payer: Self-pay

## 2021-03-05 ENCOUNTER — Ambulatory Visit
Admission: RE | Admit: 2021-03-05 | Discharge: 2021-03-05 | Disposition: A | Discharge status: Home / Self Care | Payer: Medicaid Other | Source: Ambulatory Visit | Attending: Radiation Oncology | Admitting: Radiation Oncology

## 2021-03-05 DIAGNOSIS — C50411 Malignant neoplasm of upper-outer quadrant of right female breast: Secondary | ICD-10-CM | POA: Diagnosis not present

## 2021-03-06 ENCOUNTER — Ambulatory Visit
Admission: RE | Admit: 2021-03-06 | Discharge: 2021-03-06 | Disposition: A | Discharge status: Home / Self Care | Payer: Medicaid Other | Source: Ambulatory Visit | Attending: Radiation Oncology | Admitting: Radiation Oncology

## 2021-03-06 ENCOUNTER — Other Ambulatory Visit: Payer: Self-pay

## 2021-03-06 DIAGNOSIS — C50411 Malignant neoplasm of upper-outer quadrant of right female breast: Secondary | ICD-10-CM | POA: Diagnosis present

## 2021-03-09 ENCOUNTER — Ambulatory Visit
Admission: RE | Admit: 2021-03-09 | Discharge: 2021-03-09 | Disposition: A | Discharge status: Home / Self Care | Payer: Medicaid Other | Source: Ambulatory Visit | Attending: Radiation Oncology | Admitting: Radiation Oncology

## 2021-03-09 ENCOUNTER — Ambulatory Visit: Payer: Medicaid Other

## 2021-03-09 ENCOUNTER — Other Ambulatory Visit: Payer: Self-pay

## 2021-03-09 DIAGNOSIS — C50411 Malignant neoplasm of upper-outer quadrant of right female breast: Secondary | ICD-10-CM | POA: Diagnosis not present

## 2021-03-10 ENCOUNTER — Ambulatory Visit
Admission: RE | Admit: 2021-03-10 | Discharge: 2021-03-10 | Disposition: A | Discharge status: Home / Self Care | Payer: Medicaid Other | Source: Ambulatory Visit | Attending: Radiation Oncology | Admitting: Radiation Oncology

## 2021-03-10 DIAGNOSIS — C50411 Malignant neoplasm of upper-outer quadrant of right female breast: Secondary | ICD-10-CM | POA: Diagnosis not present

## 2021-03-11 ENCOUNTER — Ambulatory Visit
Admission: RE | Admit: 2021-03-11 | Discharge: 2021-03-11 | Disposition: A | Discharge status: Home / Self Care | Payer: Medicaid Other | Source: Ambulatory Visit | Attending: Radiation Oncology | Admitting: Radiation Oncology

## 2021-03-11 DIAGNOSIS — C50411 Malignant neoplasm of upper-outer quadrant of right female breast: Secondary | ICD-10-CM | POA: Diagnosis not present

## 2021-03-12 ENCOUNTER — Ambulatory Visit
Admission: RE | Admit: 2021-03-12 | Discharge: 2021-03-12 | Disposition: A | Discharge status: Home / Self Care | Payer: Medicaid Other | Source: Ambulatory Visit | Attending: Radiation Oncology | Admitting: Radiation Oncology

## 2021-03-12 DIAGNOSIS — C50411 Malignant neoplasm of upper-outer quadrant of right female breast: Secondary | ICD-10-CM | POA: Diagnosis not present

## 2021-03-13 ENCOUNTER — Ambulatory Visit
Admission: RE | Admit: 2021-03-13 | Discharge: 2021-03-13 | Disposition: A | Discharge status: Home / Self Care | Payer: Medicaid Other | Source: Ambulatory Visit | Attending: Radiation Oncology | Admitting: Radiation Oncology

## 2021-03-13 ENCOUNTER — Other Ambulatory Visit: Payer: Self-pay

## 2021-03-13 ENCOUNTER — Telehealth: Payer: Self-pay

## 2021-03-13 DIAGNOSIS — C50411 Malignant neoplasm of upper-outer quadrant of right female breast: Secondary | ICD-10-CM | POA: Diagnosis not present

## 2021-03-13 NOTE — Telephone Encounter (Signed)
[facility] Medicaid Recertification form faxed to [facility] @ [facility].

## 2021-03-16 ENCOUNTER — Ambulatory Visit
Admission: RE | Admit: 2021-03-16 | Discharge: 2021-03-16 | Disposition: A | Discharge status: Home / Self Care | Payer: Medicaid Other | Source: Ambulatory Visit | Attending: Radiation Oncology | Admitting: Radiation Oncology

## 2021-03-16 ENCOUNTER — Other Ambulatory Visit: Payer: Self-pay

## 2021-03-16 DIAGNOSIS — C50411 Malignant neoplasm of upper-outer quadrant of right female breast: Secondary | ICD-10-CM | POA: Diagnosis not present

## 2021-03-16 MED ORDER — RADIAPLEXRX EX GEL: Freq: Once | CUTANEOUS | Status: AC

## 2021-03-17 ENCOUNTER — Ambulatory Visit
Admission: RE | Admit: 2021-03-17 | Discharge: 2021-03-17 | Disposition: A | Discharge status: Home / Self Care | Payer: Medicaid Other | Source: Ambulatory Visit | Attending: Radiation Oncology | Admitting: Radiation Oncology

## 2021-03-17 ENCOUNTER — Encounter: Payer: Self-pay | Admitting: Licensed Clinical Social Worker

## 2021-03-17 ENCOUNTER — Ambulatory Visit: Payer: Medicaid Other

## 2021-03-17 DIAGNOSIS — C50411 Malignant neoplasm of upper-outer quadrant of right female breast: Secondary | ICD-10-CM | POA: Diagnosis not present

## 2021-03-17 NOTE — Progress Notes (Signed)
[place] Work  CSW received notification that patient approved for assistance through the [employer]. Patient notified by [facility] as well.   Christeen Douglas, LCSW

## 2021-03-18 ENCOUNTER — Ambulatory Visit: Payer: Medicaid Other

## 2021-03-18 ENCOUNTER — Ambulatory Visit
Admission: RE | Admit: 2021-03-18 | Discharge: 2021-03-18 | Disposition: A | Discharge status: Home / Self Care | Payer: Medicaid Other | Source: Ambulatory Visit | Attending: Radiation Oncology | Admitting: Radiation Oncology

## 2021-03-18 ENCOUNTER — Other Ambulatory Visit: Payer: Self-pay

## 2021-03-18 DIAGNOSIS — C50411 Malignant neoplasm of upper-outer quadrant of right female breast: Secondary | ICD-10-CM | POA: Diagnosis not present

## 2021-03-19 ENCOUNTER — Ambulatory Visit
Admission: RE | Admit: 2021-03-19 | Discharge: 2021-03-19 | Disposition: A | Discharge status: Home / Self Care | Payer: Medicaid Other | Source: Ambulatory Visit | Attending: Radiation Oncology | Admitting: Radiation Oncology

## 2021-03-19 ENCOUNTER — Encounter: Payer: Self-pay | Admitting: Licensed Clinical Social Worker

## 2021-03-19 DIAGNOSIS — C50411 Malignant neoplasm of upper-outer quadrant of right female breast: Secondary | ICD-10-CM | POA: Diagnosis not present

## 2021-03-19 NOTE — Progress Notes (Signed)
[place] CSW Progress Note  Clinical Education officer, museum received TC from patient.  Pt had questions about the approval from [employer]. CSW clarified and pt expressed understanding.    Patient reports that energy is manageable now if she takes a nap after getting home from radiation and then does not push herself too much. She is getting skin irritation, but luckily only has 3 treatments left.  She was able to have her grandson come over to play outside yesterday and she was able to hold him (with assistance lifting him to her lap). Still has moments of sadness but is using her coping tools (music, changing thinking, focusing on reasons to keep going) to reset and move forward.  CSW will check-in with patient during infusion on Monday.    Christeen Douglas , LCSW

## 2021-03-20 ENCOUNTER — Other Ambulatory Visit: Payer: Self-pay

## 2021-03-20 ENCOUNTER — Ambulatory Visit
Admission: RE | Admit: 2021-03-20 | Discharge: 2021-03-20 | Disposition: A | Discharge status: Home / Self Care | Payer: Medicaid Other | Source: Ambulatory Visit | Attending: Radiation Oncology | Admitting: Radiation Oncology

## 2021-03-20 DIAGNOSIS — C50411 Malignant neoplasm of upper-outer quadrant of right female breast: Secondary | ICD-10-CM | POA: Diagnosis not present

## 2021-03-23 ENCOUNTER — Ambulatory Visit: Payer: Medicaid Other

## 2021-03-23 ENCOUNTER — Inpatient Hospital Stay: Payer: Medicaid Other

## 2021-03-23 ENCOUNTER — Encounter: Payer: Self-pay | Admitting: Licensed Clinical Social Worker

## 2021-03-23 ENCOUNTER — Other Ambulatory Visit: Payer: Self-pay

## 2021-03-23 ENCOUNTER — Ambulatory Visit
Admission: RE | Admit: 2021-03-23 | Discharge: 2021-03-23 | Disposition: A | Discharge status: Home / Self Care | Payer: Medicaid Other | Source: Ambulatory Visit | Attending: Radiation Oncology | Admitting: Radiation Oncology

## 2021-03-23 ENCOUNTER — Encounter: Payer: Self-pay | Admitting: *Deleted

## 2021-03-23 VITALS — BP 121/87 | HR 70 | Temp 98.2°F | Resp 17 | Wt 194.0 lb

## 2021-03-23 DIAGNOSIS — Z5112 Encounter for antineoplastic immunotherapy: Secondary | ICD-10-CM | POA: Insufficient documentation

## 2021-03-23 DIAGNOSIS — Z923 Personal history of irradiation: Secondary | ICD-10-CM | POA: Insufficient documentation

## 2021-03-23 DIAGNOSIS — C50411 Malignant neoplasm of upper-outer quadrant of right female breast: Secondary | ICD-10-CM | POA: Insufficient documentation

## 2021-03-23 DIAGNOSIS — C773 Secondary and unspecified malignant neoplasm of axilla and upper limb lymph nodes: Secondary | ICD-10-CM | POA: Insufficient documentation

## 2021-03-23 DIAGNOSIS — Z9221 Personal history of antineoplastic chemotherapy: Secondary | ICD-10-CM | POA: Insufficient documentation

## 2021-03-23 DIAGNOSIS — Z170 Estrogen receptor positive status [ER+]: Secondary | ICD-10-CM | POA: Insufficient documentation

## 2021-03-23 MED ORDER — ACETAMINOPHEN 325 MG PO TABS
ORAL_TABLET | ORAL | Status: AC
  Filled 2021-03-23: qty 2

## 2021-03-23 MED ORDER — SODIUM CHLORIDE 0.9% FLUSH
10.0000 mL | INTRAVENOUS | Status: DC | PRN
  Administered 2021-03-23: 10 mL
  Filled 2021-03-23: qty 10

## 2021-03-23 MED ORDER — DIPHENHYDRAMINE HCL 25 MG PO CAPS
ORAL_CAPSULE | ORAL | Status: AC
  Filled 2021-03-23: qty 2

## 2021-03-23 MED ORDER — SODIUM CHLORIDE 0.9 % IV SOLN
Freq: Once | INTRAVENOUS | Status: AC
  Filled 2021-03-23: qty 250

## 2021-03-23 MED ORDER — HEPARIN SOD (PORK) LOCK FLUSH 100 UNIT/ML IV SOLN
500.0000 [IU] | Freq: Once | INTRAVENOUS | Status: AC | PRN
  Administered 2021-03-23: 500 [IU]
  Filled 2021-03-23: qty 5

## 2021-03-23 MED ORDER — ACETAMINOPHEN 325 MG PO TABS
650.0000 mg | ORAL_TABLET | Freq: Once | ORAL | Status: AC
  Administered 2021-03-23: 650 mg via ORAL

## 2021-03-23 MED ORDER — TRASTUZUMAB-DKST CHEMO 150 MG IV SOLR
6.0000 mg/kg | Freq: Once | INTRAVENOUS | Status: AC
  Administered 2021-03-23: 504 mg via INTRAVENOUS
  Filled 2021-03-23: qty 24

## 2021-03-23 MED ORDER — DIPHENHYDRAMINE HCL 25 MG PO CAPS
50.0000 mg | ORAL_CAPSULE | Freq: Once | ORAL | Status: AC
  Administered 2021-03-23: 50 mg via ORAL

## 2021-03-23 NOTE — Patient Instructions (Signed)
[facility] Discharge Instructions for Patients Receiving Chemotherapy  Today you received the following chemotherapy agents Traztuzumab  To help prevent nausea and vomiting after your treatment, we encourage you to take your nausea medication as directed.    If you develop nausea and vomiting that is not controlled by your nausea medication, call the clinic.   BELOW ARE SYMPTOMS THAT SHOULD BE REPORTED IMMEDIATELY:  *FEVER GREATER THAN 100.5 F  *CHILLS WITH OR WITHOUT FEVER  NAUSEA AND VOMITING THAT IS NOT CONTROLLED WITH YOUR NAUSEA MEDICATION  *UNUSUAL SHORTNESS OF BREATH  *UNUSUAL BRUISING OR BLEEDING  TENDERNESS IN MOUTH AND THROAT WITH OR WITHOUT PRESENCE OF ULCERS  *URINARY PROBLEMS  *BOWEL PROBLEMS  UNUSUAL RASH Items with * indicate a potential emergency and should be followed up as soon as possible.  Feel free to call the clinic should you have any questions or concerns. The clinic phone number is [phone].  Please show the CHEMO ALERT CARD at check-in to the Emergency Department and triage nurse.   

## 2021-03-23 NOTE — Progress Notes (Signed)
[facility] CSW Progress Note  Clinical Social Worker met with patient to provide ongoing support. Congratulated patient on finishing radiation tomorrow which she is also excited about. Patient doing well overall. Was able to spend time with family yesterday and enjoyed herself.  She has connected with different foundations that provide uplifting messages or small items and is excited to share these with other cancer patients to help them as well.    CSW will see patient at next infusions appt.    Michelle E Zavala , LCSW 

## 2021-03-24 ENCOUNTER — Ambulatory Visit
Admission: RE | Admit: 2021-03-24 | Discharge: 2021-03-24 | Disposition: A | Discharge status: Home / Self Care | Payer: Medicaid Other | Source: Ambulatory Visit | Attending: Radiation Oncology | Admitting: Radiation Oncology

## 2021-03-24 ENCOUNTER — Encounter: Payer: Self-pay | Admitting: Radiation Oncology

## 2021-03-24 DIAGNOSIS — C50411 Malignant neoplasm of upper-outer quadrant of right female breast: Secondary | ICD-10-CM | POA: Diagnosis not present

## 2021-03-24 MED ORDER — RADIAPLEXRX EX GEL: Freq: Once | CUTANEOUS | Status: AC

## 2021-04-10 ENCOUNTER — Other Ambulatory Visit: Payer: Self-pay | Admitting: *Deleted

## 2021-04-10 DIAGNOSIS — Z5181 Encounter for therapeutic drug level monitoring: Secondary | ICD-10-CM

## 2021-04-10 DIAGNOSIS — Z79899 Other long term (current) drug therapy: Secondary | ICD-10-CM

## 2021-04-12 NOTE — Progress Notes (Signed)
Patient Care Team: Raiford Simmonds., PA-C as PCP - General  DIAGNOSIS:    ICD-10-CM   1. Primary malignant neoplasm of upper outer quadrant of female breast, right ([place])  C50.411     SUMMARY OF ONCOLOGIC HISTORY: Oncology History  Primary malignant neoplasm of upper outer quadrant of female breast, right ([place])  08/12/2020 Initial Diagnosis   Screening mammogram on 07/07/20 showed an asymmetry. Diagnostic mammogram and [place] on 07/29/20 showed a 0.8cm mass at the 10 o'clock position and no right axillary adenopathy. Biopsy on 08/12/20 showed invasive and in situ mammary carcinoma, grade 2, HER-2 equivocal by IHC, positive by FISH, ER+ 80%, PR+ 60%, Ki67 15%.   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  Variants of uncertain significance detected in ATM (c.131A>G (p.Asp44Gly) and c.4658A>C (p.Glu1553Ala)) and WRN (c.2825+6G>T (intronic)).  The report date is October 02, 2020.   The Multi-Cancer Panel offered by [facility] includes sequencing and/or deletion duplication testing of the following 85 genes: AIP, ALK, APC, ATM, AXIN2,BAP1,  BARD1, BLM, BMPR1A, BRCA1, BRCA2, BRIP1, CASR, CDC73, CDH1, CDK4, CDKN1B, CDKN1C, CDKN2A (p14ARF), CDKN2A (p16INK4a), CEBPA, CHEK2, CTNNA1, DICER1, DIS3L2, EGFR (c.2369C>T, p.Thr790Met variant only), EPCAM (Deletion/duplication testing only), FH, FLCN, GATA2,  GPC3, GREM1 (Promoter region deletion/duplication testing only), HOXB13 (c.251G>A, p.Gly84Glu), HRAS, KIT, MAX, MEN1, MET, MITF (c.952G>A, p.Glu318Lys variant only), MLH1, MSH2, MSH3, MSH6, MUTYH, NBN, NF1, NF2, NTHL1, PALB2, PDGFRA, PHOX2B, PMS2, POLD1, POLE, POT1, PRKAR1A, PTCH1, PTEN, RAD50, RAD51C, RAD51D, RB1, RECQL4, RET, RNF43, RUNX1, SDHAF2, SDHA (sequence changes only), SDHB, SDHC, SDHD, SMAD4, SMARCA4, SMARCB1, SMARCE1, STK11, SUFU, TERC, TERT, TMEM127, TP53, TSC1, TSC2, VHL, WRN and WT1.   Amended report: The variant of uncertain significance (VUS) in [place] at  c.2825+6G>T (Intronic) has been reclassified to likely benign.  The change in variant classification was made as a result of re-review of evidence in light of new variant interpretation guidelines and/or new information. The amended report date is December 18, 2020.    02/10/2021 -  Radiation Therapy   Adjuvant radiation     CHIEF COMPLIANT: Follow-up to discuss antiestrogen therapy  INTERVAL HISTORY: Monica [name] is a 50 y.o. with above-mentioned history of right breast cancerwhounderwent a right lumpectomyfollowed by re-excision, adjuvant chemotherapy, and radiation. She presents to the clinic today to discuss antiestrogen therapy.    ALLERGIES:  has No Known Allergies.  MEDICATIONS:  Current Outpatient Medications  Medication Sig Dispense Refill  . albuterol (VENTOLIN HFA) 108 (90 Base) MCG/ACT inhaler Inhale into the lungs every 6 (six) hours as needed for wheezing or shortness of breath.    Marland Kitchen albuterol (VENTOLIN HFA) 108 (90 Base) MCG/ACT inhaler Inhale 2 puffs into the lungs every 4 (four) hours as needed for wheezing or shortness of breath. 8 g 3  . Cholecalciferol (VITAMIN D3) 125 MCG (5000 UT) TABS Take 1 tablet by mouth daily.    . ciprofloxacin (CILOXAN) 0.3 % ophthalmic  solution Place 1 drop into both eyes every 2 (two) hours. Administer 1 drop, every 2 hours, while awake, for 2 days. Then 1 drop, every 4  hours, while awake, for the next 5 days. (Patient not taking: Reported on 01/21/2021) 10 mL 0  . lidocaine-prilocaine (EMLA) cream Apply to affected area once 30 g 3  . lisinopril (ZESTRIL) 10 MG tablet Take 1 tablet (10 mg total) by mouth daily.    . montelukast (SINGULAIR) 10 MG tablet Take 10 mg by mouth daily.    . ondansetron (ZOFRAN) 8 MG tablet Take 1 tablet (8 mg total) by mouth 2 (two) times daily as needed (Nausea or vomiting). 30 tablet 1   No current facility-administered medications for this visit.    PHYSICAL EXAMINATION: ECOG PERFORMANCE STATUS: 1 - Symptomatic but completely ambulatory  Vitals:   04/13/21 0820  BP: 123/85  Pulse: 78  Resp: 16  Temp: (!) 97.5 F (36.4 C)  SpO2: 100%   Filed Weights   04/13/21 0820  Weight: 195 lb 11.2 oz (88.8 kg)      LABORATORY DATA:  I have reviewed the data as listed CMP Latest Ref Rng & Units 03/02/2021 01/19/2021 01/12/2021  Glucose 70 - 99 mg/dL 96 100(H) 89  BUN 6 - 20 mg/dL _0 Creatinine 0.44 - 1.00 mg/dL 0.84 0.82 0.86  Sodium 135 - 145 mmol/L 139 135 136  Potassium 3.5 - 5.1 mmol/L 4.3 4.2 4.1  Chloride 98 - 111 mmol/L 107 109 107  CO2 22 - 32 mmol/L _1 Calcium 8.9 - 10.3 mg/dL 9.0 9.1 9.1  Total Protein 6.5 - 8.1 g/dL 7.5 7.1 7.2  Total Bilirubin 0.3 - 1.2 mg/dL 0.4 0.4 0.5  Alkaline Phos 38 - 126 U/L 116 96 82  AST 15 - 41 U/L _2 ALT 0 - 44 U/L _3 Lab Results  Component Value Date   WBC 6.4 04/13/2021   HGB 12.9 04/13/2021   HCT 37.9 04/13/2021   MCV 77.5 (L) 04/13/2021   PLT 234 04/13/2021   NEUTROABS 4.6 04/13/2021    ASSESSMENT & PLAN:  Primary malignant neoplasm of upper outer quadrant of female breast, right ([place]) 09/10/2020:Right lumpectomy ([facility]): invasive lobular carcinoma, 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ER 80%, PR 60%, Ki-67 15%, HER-2 equivocal by IHC positive for fish 10/11/20: Re-excision:  Neg  Recommendation: 1.Adjuvant chemotherapy with Taxol Herceptin followed by Herceptin maintenance for 1 year 2. Adjuvant radiation therapy 02/10/21- 03/24/21 3. Adjuvant antiestrogen therapywith anastrozole (patient is postmenopausal) started 04/13/2021 and anti-HER-2 therapy with neratinib -------------------------------------------------------------------------------------------------------------------------------- Current treatment: Herceptin maintenance q 3 weeks Start Anti estrogen therapy  since radiation is complete  Anastrozole counseling: We discussed the risks and benefits of anti-estrogen therapy with aromatase inhibitors. These include but not limited to insomnia, hot flashes, mood changes, vaginal dryness, bone density loss, and weight gain. We strongly believe that the benefits far outweigh the risks. Patient understands these risks and consented to starting treatment. Planned treatment duration is 7 years.  Labs reviewed: Hemoglobin is normalized as well as the rest of the CBC.  Slight microcytosis.  She will increase iron-containing foods in diet. Scalp tenderness and: She is applying coconut oil.  I will see her with labs every 6 weeks  No orders of the defined types were placed in this encounter.  The patient has a good understanding of the overall plan. she agrees  with it. she will call with any problems that may develop before the next visit here.  Total time spent: 30 mins including face to face time and time spent for planning, charting and coordination of care  Rulon Eisenmenger, MD, MPH 04/13/2021  I, Cloyde Reams Dorshimer, am acting as scribe for Dr. Nicholas Lose.  I have reviewed the above documentation for accuracy and completeness, and I agree with the above.

## 2021-04-13 ENCOUNTER — Other Ambulatory Visit: Payer: Self-pay

## 2021-04-13 ENCOUNTER — Inpatient Hospital Stay (HOSPITAL_BASED_OUTPATIENT_CLINIC_OR_DEPARTMENT_OTHER): Payer: Medicaid Other | Admitting: Hematology and Oncology

## 2021-04-13 ENCOUNTER — Inpatient Hospital Stay: Payer: Medicaid Other | Attending: Hematology and Oncology

## 2021-04-13 ENCOUNTER — Inpatient Hospital Stay: Payer: Medicaid Other

## 2021-04-13 DIAGNOSIS — C50411 Malignant neoplasm of upper-outer quadrant of right female breast: Secondary | ICD-10-CM

## 2021-04-13 DIAGNOSIS — Z79899 Other long term (current) drug therapy: Secondary | ICD-10-CM | POA: Diagnosis not present

## 2021-04-13 DIAGNOSIS — C773 Secondary and unspecified malignant neoplasm of axilla and upper limb lymph nodes: Secondary | ICD-10-CM | POA: Diagnosis present

## 2021-04-13 DIAGNOSIS — Z5112 Encounter for antineoplastic immunotherapy: Secondary | ICD-10-CM | POA: Insufficient documentation

## 2021-04-13 DIAGNOSIS — Z170 Estrogen receptor positive status [ER+]: Secondary | ICD-10-CM | POA: Insufficient documentation

## 2021-04-13 LAB — CBC WITH DIFFERENTIAL (CANCER CENTER ONLY)
Abs Immature Granulocytes: 0.01 10*3/uL (ref 0.00–0.07)
Basophils Absolute: 0 10*3/uL (ref 0.0–0.1)
Basophils Relative: 0 %
Eosinophils Absolute: 0.2 10*3/uL (ref 0.0–0.5)
Eosinophils Relative: 3 %
HCT: 37.9 % (ref 36.0–46.0)
Hemoglobin: 12.9 g/dL (ref 12.0–15.0)
Immature Granulocytes: 0 %
Lymphocytes Relative: 15 %
Lymphs Abs: 1 10*3/uL (ref 0.7–4.0)
MCH: 26.4 pg (ref 26.0–34.0)
MCHC: 34 g/dL (ref 30.0–36.0)
MCV: 77.5 fL — ABNORMAL LOW (ref 80.0–100.0)
Monocytes Absolute: 0.6 10*3/uL (ref 0.1–1.0)
Monocytes Relative: 10 %
Neutro Abs: 4.6 10*3/uL (ref 1.7–7.7)
Neutrophils Relative %: 72 %
Platelet Count: 234 10*3/uL (ref 150–400)
RBC: 4.89 MIL/uL (ref 3.87–5.11)
RDW: 14.6 % (ref 11.5–15.5)
WBC Count: 6.4 10*3/uL (ref 4.0–10.5)
nRBC: 0 % (ref 0.0–0.2)

## 2021-04-13 LAB — CMP (CANCER CENTER ONLY)
ALT: 18 U/L (ref 0–44)
AST: 28 U/L (ref 15–41)
Albumin: 3.5 g/dL (ref 3.5–5.0)
Alkaline Phosphatase: 126 U/L (ref 38–126)
Anion gap: 9 (ref 5–15)
BUN: 13 mg/dL (ref 6–20)
CO2: 25 mmol/L (ref 22–32)
Calcium: 9 mg/dL (ref 8.9–10.3)
Chloride: 106 mmol/L (ref 98–111)
Creatinine: 0.8 mg/dL (ref 0.44–1.00)
GFR, Estimated: >60 mL/min (ref >60)
Glucose, Bld: 95 mg/dL (ref 70–99)
Potassium: 3.8 mmol/L (ref 3.5–5.1)
Sodium: 140 mmol/L (ref 135–145)
Total Bilirubin: 0.3 mg/dL (ref 0.3–1.2)
Total Protein: 7.2 g/dL (ref 6.5–8.1)

## 2021-04-13 MED ORDER — ACETAMINOPHEN 325 MG PO TABS
ORAL_TABLET | ORAL | Status: AC
  Filled 2021-04-13: qty 2

## 2021-04-13 MED ORDER — ACETAMINOPHEN 325 MG PO TABS
650.0000 mg | ORAL_TABLET | Freq: Once | ORAL | Status: AC
  Administered 2021-04-13: 650 mg via ORAL

## 2021-04-13 MED ORDER — DIPHENHYDRAMINE HCL 25 MG PO CAPS
50.0000 mg | ORAL_CAPSULE | Freq: Once | ORAL | Status: AC
Start: 2021-04-13 — End: 2021-04-13
  Administered 2021-04-13: 50 mg via ORAL

## 2021-04-13 MED ORDER — HEPARIN SOD (PORK) LOCK FLUSH 100 UNIT/ML IV SOLN
500.0000 [IU] | Freq: Once | INTRAVENOUS | Status: AC | PRN
  Administered 2021-04-13: 500 [IU]
  Filled 2021-04-13: qty 5

## 2021-04-13 MED ORDER — DIPHENHYDRAMINE HCL 25 MG PO CAPS
ORAL_CAPSULE | ORAL | Status: AC
  Filled 2021-04-13: qty 2

## 2021-04-13 MED ORDER — ANASTROZOLE 1 MG PO TABS: 1.0000 mg | ORAL_TABLET | Freq: Every day | ORAL | 3 refills | Status: DC

## 2021-04-13 MED ORDER — TRASTUZUMAB-DKST CHEMO 150 MG IV SOLR
6.0000 mg/kg | Freq: Once | INTRAVENOUS | Status: AC
  Administered 2021-04-13: 504 mg via INTRAVENOUS
  Filled 2021-04-13: qty 24

## 2021-04-13 MED ORDER — SODIUM CHLORIDE 0.9% FLUSH
10.0000 mL | INTRAVENOUS | Status: DC | PRN
  Administered 2021-04-13: 10 mL
  Filled 2021-04-13: qty 10

## 2021-04-13 MED ORDER — SODIUM CHLORIDE 0.9 % IV SOLN
Freq: Once | INTRAVENOUS | Status: AC
  Filled 2021-04-13: qty 250

## 2021-04-13 NOTE — Assessment & Plan Note (Signed)
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 Adjuvant radiation therapy 02/10/21- 03/24/21 3. Adjuvant antiestrogen therapywith anastrozole (patient is postmenopausal) and anti-HER-2 therapy with neratinib -------------------------------------------------------------------------------------------------------------------------------- Current treatment: Herceptin maintenance q 3 weeks Start Anti estrogen therapy  since radiation is complete  Anastrozole counseling: We discussed the risks and benefits of anti-estrogen therapy with aromatase inhibitors. These include but not limited to insomnia, hot flashes, mood changes, vaginal dryness, bone density loss, and weight gain. We strongly believe that the benefits far outweigh the risks. Patient understands these risks and consented to starting treatment. Planned treatment duration is 7 years.  Labs reviewed: Hemoglobin is normalized as well as the rest of the CBC.  Slight microcytosis.  She will increase iron-containing foods in diet.  I will see her with labs every 6 weeks

## 2021-04-13 NOTE — Patient Instructions (Signed)
[facility] MEDICAL ONCOLOGY  Discharge Instructions: Thank you for choosing [facility] to provide your oncology and hematology care.   If you have a lab appointment with the Cancer Center, please go directly to the Cancer Center and check in at the registration area.   Wear comfortable clothing and clothing appropriate for easy access to any Portacath or PICC line.   We strive to give you quality time with your provider. You may need to reschedule your appointment if you arrive late (15 or more minutes).  Arriving late affects you and other patients whose appointments are after yours.  Also, if you miss three or more appointments without notifying the office, you may be dismissed from the clinic at the provider's discretion.      For prescription refill requests, have your pharmacy contact our office and allow 72 hours for refills to be completed.    Today you received the following chemotherapy and/or immunotherapy agents trastuzumab      To help prevent nausea and vomiting after your treatment, we encourage you to take your nausea medication as directed.  BELOW ARE SYMPTOMS THAT SHOULD BE REPORTED IMMEDIATELY: *FEVER GREATER THAN 100.4 F (38 C) OR HIGHER *CHILLS OR SWEATING *NAUSEA AND VOMITING THAT IS NOT CONTROLLED WITH YOUR NAUSEA MEDICATION *UNUSUAL SHORTNESS OF BREATH *UNUSUAL BRUISING OR BLEEDING *URINARY PROBLEMS (pain or burning when urinating, or frequent urination) *BOWEL PROBLEMS (unusual diarrhea, constipation, pain near the anus) TENDERNESS IN MOUTH AND THROAT WITH OR WITHOUT PRESENCE OF ULCERS (sore throat, sores in mouth, or a toothache) UNUSUAL RASH, SWELLING OR PAIN  UNUSUAL VAGINAL DISCHARGE OR ITCHING   Items with * indicate a potential emergency and should be followed up as soon as possible or go to the Emergency Department if any problems should occur.  Please show the CHEMOTHERAPY ALERT CARD or IMMUNOTHERAPY ALERT CARD at check-in to  the Emergency Department and triage nurse.  Should you have questions after your visit or need to cancel or reschedule your appointment, please contact [facility] MEDICAL [facility]COLOG[facility]  Dept: [phone]  and follow the prompts.  Office hours are 8:00 a.m. to 4:30 p.m. Monday - Friday. Please note that voicemails left after 4:00 p.m. may not be returned until the following business day.  We are closed weekends and major holidays. You have access to a nurse at all times for urgent questions. Please call the main number to the clinic Dept: [phone] and follow the prompts.   For any non-urgent questions, you may also contact your provider using MyChart. We now offer e-Visits for anyone 18 and older to request care online for non-urgent symptoms. For details visit [email or web address].   Also download the MyChart app! Go to the app store, search "MyChart", open the app, select , and log in with your MyChart username and password.  Due to Covid, a mask is required upon entering the hospital/clinic. If you do not have a mask, one will be given to you upon arrival. For doctor visits, patients may have 1 support person aged 18 or older with them. For treatment visits, patients cannot have anyone with them due to current Covid guidelines and our immunocompromised population.   

## 2021-04-16 ENCOUNTER — Telehealth: Payer: Self-pay | Admitting: Hematology and Oncology

## 2021-04-16 NOTE — Telephone Encounter (Signed)
Sch per 5/09 los, left message.

## 2021-04-23 ENCOUNTER — Other Ambulatory Visit: Payer: Self-pay

## 2021-04-23 ENCOUNTER — Ambulatory Visit (HOSPITAL_COMMUNITY)
Admission: RE | Admit: 2021-04-23 | Discharge: 2021-04-23 | Disposition: A | Discharge status: Home / Self Care | Payer: Medicaid Other | Source: Ambulatory Visit | Attending: Hematology and Oncology | Admitting: Hematology and Oncology

## 2021-04-23 DIAGNOSIS — Z5181 Encounter for therapeutic drug level monitoring: Secondary | ICD-10-CM | POA: Diagnosis not present

## 2021-04-23 DIAGNOSIS — Z79899 Other long term (current) drug therapy: Secondary | ICD-10-CM | POA: Insufficient documentation

## 2021-04-23 DIAGNOSIS — I10 Essential (primary) hypertension: Secondary | ICD-10-CM | POA: Insufficient documentation

## 2021-04-23 DIAGNOSIS — Z0189 Encounter for other specified special examinations: Secondary | ICD-10-CM

## 2021-04-23 LAB — ECHOCARDIOGRAM COMPLETE
Area-P 1/2: 3.97 cm2
S' Lateral: 2.6 cm

## 2021-04-23 NOTE — Progress Notes (Signed)
  Echocardiogram 2D Echocardiogram has been performed.  Monica [name] 04/23/2021, 10:02 AM

## 2021-04-24 ENCOUNTER — Ambulatory Visit
Admission: RE | Admit: 2021-04-24 | Discharge: 2021-04-24 | Disposition: A | Discharge status: Home / Self Care | Payer: Medicaid Other | Source: Ambulatory Visit | Attending: Radiation Oncology | Admitting: Radiation Oncology

## 2021-04-24 ENCOUNTER — Other Ambulatory Visit: Payer: Medicaid Other

## 2021-04-24 VITALS — BP 135/89 | HR 68 | Temp 96.8°F | Resp 18 | Ht 63.0 in | Wt 194.0 lb

## 2021-04-24 DIAGNOSIS — Z79899 Other long term (current) drug therapy: Secondary | ICD-10-CM | POA: Diagnosis not present

## 2021-04-24 DIAGNOSIS — Z170 Estrogen receptor positive status [ER+]: Secondary | ICD-10-CM | POA: Insufficient documentation

## 2021-04-24 DIAGNOSIS — Z79811 Long term (current) use of aromatase inhibitors: Secondary | ICD-10-CM | POA: Insufficient documentation

## 2021-04-24 DIAGNOSIS — C50411 Malignant neoplasm of upper-outer quadrant of right female breast: Secondary | ICD-10-CM | POA: Insufficient documentation

## 2021-04-24 DIAGNOSIS — Z9221 Personal history of antineoplastic chemotherapy: Secondary | ICD-10-CM | POA: Insufficient documentation

## 2021-04-24 DIAGNOSIS — R609 Edema, unspecified: Secondary | ICD-10-CM | POA: Insufficient documentation

## 2021-04-24 DIAGNOSIS — Z923 Personal history of irradiation: Secondary | ICD-10-CM | POA: Diagnosis not present

## 2021-04-24 NOTE — Progress Notes (Signed)
Monica [name] presents today for follow-up after completing radiation to her right breast on 03/24/2021  Pain: Continues to deal with occasional sharp/burning pain near lumpectomy site. States it lingers for awhile, but will eventually resolve on its own Skin: Reports occasional itching. States skin peeled a couple weeks after completing radiation. Reports it's intact now but there are areas of hypopigmentation where peeling occurred.  ROM: Denies any issues Lymphedema: Reports a firm lump near lumpectomy incision (most likely a seroma). Denies any swelling down her right arm MedOnc F/U: Last saw Dr. Nicholas Lose on 04/13/2021 --Recommendation: 1.Adjuvant chemotherapy with Taxol Herceptin followed by Herceptin maintenance for 1 year 2. Adjuvant radiation therapy3/8/22- 03/24/21 3. Adjuvant antiestrogen therapywith anastrozole (patient is postmenopausal) started 04/13/2021 and anti-HER-2 therapy with neratinib --I will see her with labs every 6 weeks  Other issues of note: Overall doing well. Continues to deal with fatigue and states she stays warm/has hot flashes. Otherwise denies any issues/concerns  Pt reports Yes No Comments  Tamoxifen _0  _1    Letrozole _2  _3    Anastrazole _4  Started on 04/13/21  _5    Mammogram _6  Date: TBD by Dr. Lindi Adie _7     Wt Readings from Last 3 Encounters:  04/24/21 194 lb (88 kg)  04/13/21 195 lb 11.2 oz (88.8 kg)  03/23/21 194 lb (88 kg)   Vitals:   04/24/21 1025  BP: 135/89  Pulse: 68  Resp: 18  Temp: (!) 96.8 F (36 C)  SpO2: 100%

## 2021-04-27 ENCOUNTER — Encounter: Payer: Self-pay | Admitting: Radiation Oncology

## 2021-04-27 NOTE — Progress Notes (Signed)
Radiation Oncology         [phone]) [phone] ________________________________  Name: Monica [name] MRN: [id number]  Date: 04/24/2021  DOB: 1971/05/05  Follow-Up Visit Note  Outpatient  CC: Muse, Linwood Dibbles D., PA-C  Nicholas Lose, MD  Diagnosis and Prior Radiotherapy:    ICD-10-CM   1. Malignant neoplasm of upper-outer quadrant of right breast in female, estrogen receptor positive ([place])  C50.411    Z17.0   2. Primary malignant neoplasm of upper outer quadrant of female breast, right ([place])  C50.411     CHIEF COMPLAINT: Here for follow-up and surveillance of breast cancer  Narrative:  The patient returns today for routine follow-up.   Ms. Shambaugh presents today for follow-up after completing radiation to her right breast on 03/24/2021  Pain: Continues to deal with occasional sharp/burning pain near lumpectomy site. States it lingers for awhile, but will eventually resolve on its own Skin: Reports occasional itching. States skin peeled a couple weeks after completing radiation. Reports it's intact now but there are areas of hypopigmentation where peeling occurred.  ROM: Denies any issues Lymphedema: Reports a firm lump near lumpectomy incision (most likely a seroma). Denies any swelling down her right arm MedOnc F/U: Last saw Dr. Nicholas Lose on 04/13/2021 --Recommendation: 1.Adjuvant chemotherapy with Taxol Herceptin followed by Herceptin maintenance for 1 year 2. Adjuvant radiation therapy3/8/22- 03/24/21 3. Adjuvant antiestrogen therapywith anastrozole (patient is postmenopausal) started 04/13/2021 and anti-HER-2 therapy with neratinib --I will see her with labs every 6 weeks  Other issues of note: Overall doing well. Continues to deal with fatigue and states she stays warm/has hot flashes. Otherwise denies any issues/concerns  Pt reports Yes No Comments  Tamoxifen [] [x]   Letrozole [] [x]   Anastrazole [x] Started on 04/13/21  []   Mammogram [] Date: TBD by Dr. Lindi Adie  []    Wt Readings from Last 3 Encounters:  04/24/21 194 lb (88 kg)  04/13/21 195 lb 11.2 oz (88.8 kg)  03/23/21 194 lb (88 kg)   Vitals:   04/24/21 1025  BP: 135/89  Pulse: 68  Resp: 18  Temp: (!) 96.8 F (36 C)  SpO2: 100%                                 ALLERGIES:  has No Known Allergies.  Meds: Current Outpatient Medications  Medication Sig Dispense Refill  . albuterol (VENTOLIN HFA) 108 (90 Base) MCG/ACT inhaler Inhale into the lungs every 6 (six) hours as needed for wheezing or shortness of breath.    Marland Kitchen albuterol (VENTOLIN HFA) 108 (90 Base) MCG/ACT inhaler Inhale 2 puffs into the lungs every 4 (four) hours as needed for wheezing or shortness of breath. 8 g 3  . anastrozole (ARIMIDEX) 1 MG tablet Take 1 tablet (1 mg total) by mouth daily. 90 tablet 3  . Cholecalciferol (VITAMIN D3) 125 MCG (5000 UT) TABS Take 1 tablet by mouth daily.    . ciprofloxacin (CILOXAN) 0.3 % ophthalmic solution Place 1 drop into both eyes every 2 (two) hours. Administer 1 drop, every 2 hours, while awake, for 2 days. Then 1 drop, every 4 hours, while awake, for the next 5 days. (Patient not taking: Reported on 01/21/2021) 10 mL 0  . lidocaine-prilocaine (EMLA) cream Apply to affected area once 30 g 3  . lisinopril (ZESTRIL) 10 MG tablet Take 1 tablet (10 mg total) by mouth daily.    . montelukast (  SINGULAIR) 10 MG tablet Take 10 mg by mouth daily.    . ondansetron (ZOFRAN) 8 MG tablet Take 1 tablet (8 mg total) by mouth 2 (two) times daily as needed (Nausea or vomiting). 30 tablet 1   No current facility-administered medications for this encounter.    Physical Findings: The patient is in no acute distress. Patient is alert and oriented.  height is 5' 3" (1.6 m) and weight is 194 lb (88 kg). Her temporal temperature is 96.8 F (36 C) (abnormal). Her blood pressure is 135/89 and her pulse is 68. Her respiration is 18 and oxygen saturation is 100%. .    Satisfactory skin healing in radiotherapy  fields w/ areas of hypopigmentation where peeling occurred. The firm lump near lumpectomy incision is most likely a seroma and or scar tissue based on palpation.  Not a clinical concern.  Lab Findings: Lab Results  Component Value Date   WBC 6.4 04/13/2021   HGB 12.9 04/13/2021   HCT 37.9 04/13/2021   MCV 77.5 (L) 04/13/2021   PLT 234 04/13/2021    Radiographic Findings: ECHOCARDIOGRAM COMPLETE  Result Date: 04/23/2021    ECHOCARDIOGRAM REPORT   Patient Name:   Monica [name] [facility] Date of Exam: 04/23/2021 Medical Rec #:  [id number]                Height:       63.0 in Accession #:    [id number]               Weight:       195.7 lb Date of Birth:  [birth year]                BSA:          1.916 m Patient Age:    50 years                 BP:           124/83 mmHg Patient Gender: F                        HR:           64 bpm. Exam Location:  Outpatient Procedure: 2D Echo, Color Doppler, Cardiac Doppler and Strain Analysis Indications:    Chemo Z09  History:        Patient has prior history of Echocardiogram examinations, most                 recent 01/16/2021. Risk Factors:Hypertension.  Sonographer:    Bernadene Person RDCS Referring Phys: [id number] Nicholas Lose IMPRESSIONS  1. Left ventricular ejection fraction, by estimation, is 60 to 65%. The left ventricle has normal function. The left ventricle has no regional wall motion abnormalities. Left ventricular diastolic parameters were normal. The average left ventricular global longitudinal strain is -23.0 %. The global longitudinal strain is normal.  2. Right ventricular systolic function is normal. The right ventricular size is normal. Tricuspid regurgitation signal is inadequate for assessing PA pressure.  3. The mitral valve is normal in structure. No evidence of mitral valve regurgitation. No evidence of mitral stenosis.  4. The aortic valve is normal in structure. Aortic valve regurgitation is not visualized. No aortic stenosis is present.  5. The  inferior vena cava is normal in size with greater than 50% respiratory variability, suggesting right atrial pressure of 3 mmHg. FINDINGS  Left Ventricle: Left ventricular ejection fraction, by estimation, is 60 to 65%. The left  ventricle has normal function. The left ventricle has no regional wall motion abnormalities. The average left ventricular global longitudinal strain is -23.0 %. The global longitudinal strain is normal. The left ventricular internal cavity size was normal in size. There is no left ventricular hypertrophy. Left ventricular diastolic parameters were normal. Normal left ventricular filling pressure. Right Ventricle: The right ventricular size is normal. No increase in right ventricular wall thickness. Right ventricular systolic function is normal. Tricuspid regurgitation signal is inadequate for assessing PA pressure. Left Atrium: Left atrial size was normal in size. Right Atrium: Right atrial size was normal in size. Pericardium: There is no evidence of pericardial effusion. Mitral Valve: The mitral valve is normal in structure. No evidence of mitral valve regurgitation. No evidence of mitral valve stenosis. Tricuspid Valve: The tricuspid valve is normal in structure. Tricuspid valve regurgitation is not demonstrated. No evidence of tricuspid stenosis. Aortic Valve: The aortic valve is normal in structure. Aortic valve regurgitation is not visualized. No aortic stenosis is present. Pulmonic Valve: The pulmonic valve was normal in structure. Pulmonic valve regurgitation is trivial. No evidence of pulmonic stenosis. Aorta: The aortic root is normal in size and structure. Venous: The inferior vena cava is normal in size with greater than 50% respiratory variability, suggesting right atrial pressure of 3 mmHg. IAS/Shunts: No atrial level shunt detected by color flow Doppler.  LEFT VENTRICLE PLAX 2D LVIDd:         4.30 cm  Diastology LVIDs:         2.60 cm  LV e' medial:    7.73 cm/s LV PW:          0.80 cm  LV E/e' medial:  7.9 LV IVS:        0.80 cm  LV e' lateral:   12.30 cm/s LVOT diam:     2.00 cm  LV E/e' lateral: 5.0 LV SV:         67 LV SV Index:   35       2D Longitudinal Strain LVOT Area:     3.14 cm 2D Strain GLS (A2C):   -23.8 %                         2D Strain GLS (A3C):   -23.4 %                         2D Strain GLS (A4C):   -21.8 %                         2D Strain GLS Avg:     -23.0 % RIGHT VENTRICLE RV S prime:     11.20 cm/s TAPSE (M-mode): 1.9 cm LEFT ATRIUM             Index       RIGHT ATRIUM          Index LA diam:        3.20 cm 1.67 cm/m  RA Area:     9.73 cm LA Vol (A2C):   23.4 ml 12.21 ml/m RA Volume:   17.10 ml 8.93 ml/m LA Vol (A4C):   22.2 ml 11.59 ml/m LA Biplane Vol: 24.7 ml 12.89 ml/m  AORTIC VALVE LVOT Vmax:   82.00 cm/s LVOT Vmean:  60.400 cm/s LVOT VTI:    0.212 m  AORTA Ao Root diam: 3.20 cm Ao Asc diam:  3.40 cm  MITRAL VALVE MV Area (PHT): 3.97 cm    SHUNTS MV Decel Time: 191 msec    Systemic VTI:  0.21 m MV E velocity: 61.40 cm/s  Systemic Diam: 2.00 cm MV A velocity: 66.20 cm/s MV E/A ratio:  0.93 Fransico Him MD Electronically signed by Fransico Him MD Signature Date/Time: 04/23/2021/12:58:29 PM    Final     Impression/Plan: Healing well from radiotherapy to the breast tissue.  Continue BID skin care with topical Vitamin E Oil and / or lotion for at least 2 more months for further healing. She may also try cocoa butter if she prefers.  I told her that it usually takes several months for healing to maximize.  I encouraged her to continue with yearly mammography as appropriate (for intact breast tissue) and followup with medical oncology. I will see her back on an as-needed basis. I have encouraged her to call if she has any issues or concerns in the future. I wished her the very best.  On date of service, in total, I spent 12 minutes on this encounter. Patient was seen in person.  _____________________________________   Eppie Gibson, MD

## 2021-04-29 ENCOUNTER — Encounter: Payer: Self-pay | Admitting: Licensed Clinical Social Worker

## 2021-04-29 NOTE — Progress Notes (Signed)
[facility] CSW Progress Note  Clinical Education officer, museum received TC from patient asking about resources for ongoing counseling. CSW provided information and patient will call to schedule.  CSW provided space for patient to begin processing fears that she is experiencing around fear of recurrence/ scanxiety.  CSW normalized fears and encouraged patient to continue her positive coping skills of walking and listening to music.  Began brief discussion on additional ways to manage anxiety including mindfulness.  Patient will contact local therapist to connect for ongoing support. CSW will see patient in infusion on 05/05/2021.    Christeen Douglas , LCSW

## 2021-05-05 ENCOUNTER — Other Ambulatory Visit: Payer: Self-pay

## 2021-05-05 ENCOUNTER — Inpatient Hospital Stay: Payer: Medicaid Other

## 2021-05-05 ENCOUNTER — Encounter: Payer: Self-pay | Admitting: Licensed Clinical Social Worker

## 2021-05-05 VITALS — BP 131/84 | HR 64 | Temp 97.8°F | Resp 20 | Ht 63.0 in | Wt 197.8 lb

## 2021-05-05 DIAGNOSIS — C50411 Malignant neoplasm of upper-outer quadrant of right female breast: Secondary | ICD-10-CM

## 2021-05-05 DIAGNOSIS — Z95828 Presence of other vascular implants and grafts: Secondary | ICD-10-CM

## 2021-05-05 DIAGNOSIS — Z5112 Encounter for antineoplastic immunotherapy: Secondary | ICD-10-CM | POA: Diagnosis not present

## 2021-05-05 LAB — CBC WITH DIFFERENTIAL (CANCER CENTER ONLY)
Abs Immature Granulocytes: 0.01 10*3/uL (ref 0.00–0.07)
Basophils Absolute: 0 10*3/uL (ref 0.0–0.1)
Basophils Relative: 1 %
Eosinophils Absolute: 0.2 10*3/uL (ref 0.0–0.5)
Eosinophils Relative: 3 %
HCT: 36.3 % (ref 36.0–46.0)
Hemoglobin: 12.5 g/dL (ref 12.0–15.0)
Immature Granulocytes: 0 %
Lymphocytes Relative: 19 %
Lymphs Abs: 1.5 10*3/uL (ref 0.7–4.0)
MCH: 26.8 pg (ref 26.0–34.0)
MCHC: 34.4 g/dL (ref 30.0–36.0)
MCV: 77.7 fL — ABNORMAL LOW (ref 80.0–100.0)
Monocytes Absolute: 0.7 10*3/uL (ref 0.1–1.0)
Monocytes Relative: 9 %
Neutro Abs: 5.4 10*3/uL (ref 1.7–7.7)
Neutrophils Relative %: 68 %
Platelet Count: 220 10*3/uL (ref 150–400)
RBC: 4.67 MIL/uL (ref 3.87–5.11)
RDW: 14.9 % (ref 11.5–15.5)
WBC Count: 7.9 10*3/uL (ref 4.0–10.5)
nRBC: 0 % (ref 0.0–0.2)

## 2021-05-05 LAB — CMP (CANCER CENTER ONLY)
ALT: 17 U/L (ref 0–44)
AST: 26 U/L (ref 15–41)
Albumin: 3.5 g/dL (ref 3.5–5.0)
Alkaline Phosphatase: 105 U/L (ref 38–126)
Anion gap: 8 (ref 5–15)
BUN: 12 mg/dL (ref 6–20)
CO2: 25 mmol/L (ref 22–32)
Calcium: 9.4 mg/dL (ref 8.9–10.3)
Chloride: 107 mmol/L (ref 98–111)
Creatinine: 0.74 mg/dL (ref 0.44–1.00)
GFR, Estimated: >60 mL/min (ref >60)
Glucose, Bld: 103 mg/dL — ABNORMAL HIGH (ref 70–99)
Potassium: 4 mmol/L (ref 3.5–5.1)
Sodium: 140 mmol/L (ref 135–145)
Total Bilirubin: 0.3 mg/dL (ref 0.3–1.2)
Total Protein: 7 g/dL (ref 6.5–8.1)

## 2021-05-05 MED ORDER — SODIUM CHLORIDE 0.9 % IV SOLN
Freq: Once | INTRAVENOUS | Status: AC
  Filled 2021-05-05: qty 250

## 2021-05-05 MED ORDER — ACETAMINOPHEN 325 MG PO TABS
ORAL_TABLET | ORAL | Status: AC
  Filled 2021-05-05: qty 2

## 2021-05-05 MED ORDER — DIPHENHYDRAMINE HCL 25 MG PO CAPS
ORAL_CAPSULE | ORAL | Status: AC
  Filled 2021-05-05: qty 2

## 2021-05-05 MED ORDER — SODIUM CHLORIDE 0.9% FLUSH
10.0000 mL | Freq: Once | INTRAVENOUS | Status: AC
  Administered 2021-05-05: 10 mL
  Filled 2021-05-05: qty 10

## 2021-05-05 MED ORDER — DIPHENHYDRAMINE HCL 25 MG PO CAPS
50.0000 mg | ORAL_CAPSULE | Freq: Once | ORAL | Status: AC
  Administered 2021-05-05: 50 mg via ORAL

## 2021-05-05 MED ORDER — SODIUM CHLORIDE 0.9% FLUSH
10.0000 mL | INTRAVENOUS | Status: DC | PRN
  Administered 2021-05-05: 10 mL
  Filled 2021-05-05: qty 10

## 2021-05-05 MED ORDER — TRASTUZUMAB-DKST CHEMO 150 MG IV SOLR
6.0000 mg/kg | Freq: Once | INTRAVENOUS | Status: AC
  Administered 2021-05-05: 504 mg via INTRAVENOUS
  Filled 2021-05-05: qty 24

## 2021-05-05 MED ORDER — HEPARIN SOD (PORK) LOCK FLUSH 100 UNIT/ML IV SOLN
500.0000 [IU] | Freq: Once | INTRAVENOUS | Status: AC | PRN
  Administered 2021-05-05: 500 [IU]
  Filled 2021-05-05: qty 5

## 2021-05-05 MED ORDER — ACETAMINOPHEN 325 MG PO TABS
650.0000 mg | ORAL_TABLET | Freq: Once | ORAL | Status: AC
  Administered 2021-05-05: 650 mg via ORAL

## 2021-05-05 NOTE — Progress Notes (Signed)
[facility] CSW Progress Note  [facility] representative met with patient to provide ongoing support. Continued processing fear and trauma response when thinking about going back for mammogram. Discussed physiological reactions and ways to plan for extra support and use of coping skills leading up to scans and then awaiting results.  CSW also began work on cognitive restructuring to help balance fear talk with more helpful thinking.  Patient was able to go for a walk this weekend and to spend time with her daughter and grandson which was helpful for her.   Virtual visit scheduled with [facility] for 6/6 to begin ongoing counseling.    Christeen Douglas , LCSW

## 2021-05-05 NOTE — Patient Instructions (Addendum)
[facility] MEDICAL ONCOLOGY  *Use Claritin once a day to help alleviate bone pain*  Discharge Instructions: Thank you for choosing [facility] to provide your oncology and hematology care.   If you have a lab appointment with the [facility], please go directly to the [facility] and check in at the registration area.   Wear comfortable clothing and clothing appropriate for easy access to any Portacath or PICC line.   We strive to give you quality time with your provider. You may need to reschedule your appointment if you arrive late (15 or more minutes).  Arriving late affects you and other patients whose appointments are after yours.  Also, if you miss three or more appointments without notifying the office, you may be dismissed from the clinic at the provider's discretion.      For prescription refill requests, have your pharmacy contact our office and allow 72 hours for refills to be completed.    Today you received the following chemotherapy and/or immunotherapy agents: Trastuzumab (Herceptin)   To help prevent nausea and vomiting after your treatment, we encourage you to take your nausea medication as directed.  BELOW ARE SYMPTOMS THAT SHOULD BE REPORTED IMMEDIATELY: . *FEVER GREATER THAN 100.4 F (38 C) OR HIGHER . *CHILLS OR SWEATING . *NAUSEA AND VOMITING THAT IS NOT CONTROLLED WITH YOUR NAUSEA MEDICATION . *UNUSUAL SHORTNESS OF BREATH . *UNUSUAL BRUISING OR BLEEDING . *URINARY PROBLEMS (pain or burning when urinating, or frequent urination) . *BOWEL PROBLEMS (unusual diarrhea, constipation, pain near the anus) . TENDERNESS IN MOUTH AND THROAT WITH OR WITHOUT PRESENCE OF ULCERS (sore throat, sores in mouth, or a toothache) . UNUSUAL RASH, SWELLING OR PAIN  . UNUSUAL VAGINAL DISCHARGE OR ITCHING   Items with * indicate a potential emergency and should be followed up as soon as possible or go to the Emergency Department if any problems should  occur.  Please show the CHEMOTHERAPY ALERT CARD or IMMUNOTHERAPY ALERT CARD at check-in to the Emergency Department and triage nurse.  Should you have questions after your visit or need to cancel or reschedule your appointment, please contact [facility]  Dept: [phone]  and follow the prompts.  Office hours are 8:00 a.m. to 4:30 p.m. Monday - Friday. Please note that voicemails left after 4:00 p.m. may not be returned until the following business day.  We are closed weekends and major holidays. You have access to a nurse at all times for urgent questions. Please call the main number to the clinic Dept: [phone] and follow the prompts.   For any non-urgent questions, you may also contact your provider using MyChart. We now offer e-Visits for anyone 70 and older to request care online for non-urgent symptoms. For details visit [email or web address].   Also download the MyChart app! Go to the app store, search "MyChart", open the app, select [facility], and log in with your MyChart username and password.  Due to Covid, a mask is required upon entering the hospital/clinic. If you do not have a mask, one will be given to you upon arrival. For doctor visits, patients may have 1 support person aged 66 or older with them. For treatment visits, patients cannot have anyone with them due to current Covid guidelines and our immunocompromised population.

## 2021-05-08 ENCOUNTER — Telehealth: Payer: Self-pay

## 2021-05-08 NOTE — Telephone Encounter (Signed)
Pt call stating tx made her nauseated and did not have her antiemetics with her. Pt states now that she has antiemetics she is feeling better and is able to eat/drink. Advised pt to call us if sx do not improve. Pt verbalized thanks and understanding.

## 2021-05-13 ENCOUNTER — Encounter: Payer: Self-pay | Admitting: Hematology and Oncology

## 2021-05-13 NOTE — Progress Notes (Signed)
  Patient Name: Monica [name] MRN: [id number] DOB: 04/17/1971 Referring Physician: Nicholas Lose (Profile Not Attached) Date of Service: 03/24/2021 [facility] [place]                                                        End Of Treatment Note  Diagnoses: C50.411-Malignant neoplasm of upper-outer quadrant of right female breast  Cancer Staging: Cancer Staging Primary malignant neoplasm of upper outer quadrant of female breast, right ([place]) Staging form: Breast, AJCC 8th Edition - Clinical stage from 08/26/2020: Stage IA (cT1b, cN0, cM0, G2, ER+, PR+, HER2+) - Signed by Eppie Gibson, MD on 08/27/2020 Stage prefix: Initial diagnosis Histologic grading system: 3 grade system  pT1c, pN1a   Intent: Curative  Radiation Treatment Dates: 02/10/2021 through 03/24/2021 Site Technique Total Dose (Gy) Dose per Fx (Gy) Completed Fx Beam Energies  Breast, Right: Breast_Rt 3D 50/50 2 25/25 10X  Breast, Right: Breast_Rt_PAB_SCV 3D 50/50 2 25/25 6X, 15X  Breast, Right: Breast_Rt_Bst 3D 10/10 2 5/5 6X, 10X   Narrative: The patient tolerated radiation therapy relatively well.   Plan: The patient will follow-up with radiation oncology in 35mo.  -----------------------------------  Eppie Gibson, MD

## 2021-05-22 ENCOUNTER — Encounter: Payer: Self-pay | Admitting: *Deleted

## 2021-05-23 NOTE — Progress Notes (Signed)
Patient Care Team: Raiford Simmonds., PA-C as PCP - General  DIAGNOSIS:    ICD-10-CM   1. Primary malignant neoplasm of upper outer quadrant of female breast, right ([place])  C50.411       SUMMARY OF ONCOLOGIC HISTORY: Oncology History  Primary malignant neoplasm of upper outer quadrant of female breast, right ([place])  08/12/2020 Initial Diagnosis   Screening mammogram on 07/07/20 showed an asymmetry. Diagnostic mammogram and [place] on 07/29/20 showed a 0.8cm mass at the 10 o'clock position and no right axillary adenopathy. Biopsy on 08/12/20 showed invasive and in situ mammary carcinoma, grade 2, HER-2 equivocal by IHC, positive by FISH, ER+ 80%, PR+ 60%, Ki67 15%.   08/26/2020 Cancer Staging   Staging form: Breast, AJCC 8th Edition - Clinical stage from 08/26/2020: Stage IA (cT1b, cN0, cM0, G2, ER+, PR+, HER2+) - Signed by Eppie Gibson, MD on 08/27/2020    09/10/2020 Surgery   Right lumpectomy ([facility]): invasive lobular carcinoma, 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  Variants of uncertain significance detected in ATM (c.131A>G (p.Asp44Gly) and c.4658A>C (p.Glu1553Ala)) and WRN (c.2825+6G>T (intronic)).  The report date is October 02, 2020.   The Multi-Cancer Panel offered by [facility] includes sequencing and/or deletion duplication testing of the following 85 genes: AIP, ALK, APC, ATM, AXIN2,BAP1,  BARD1, BLM, BMPR1A, BRCA1, BRCA2, BRIP1, CASR, CDC73, CDH1, CDK4, CDKN1B, CDKN1C, CDKN2A (p14ARF), CDKN2A (p16INK4a), CEBPA, CHEK2, CTNNA1, DICER1, DIS3L2, EGFR (c.2369C>T, p.Thr790Met variant only), EPCAM (Deletion/duplication testing only), FH, FLCN,  GATA2, GPC3, GREM1 (Promoter region deletion/duplication testing only), HOXB13 (c.251G>A, p.Gly84Glu), HRAS, KIT, MAX, MEN1, MET, MITF (c.952G>A, p.Glu318Lys variant only), MLH1, MSH2, MSH3, MSH6, MUTYH, NBN, NF1, NF2, NTHL1, PALB2, PDGFRA, PHOX2B, PMS2, POLD1, POLE, POT1, PRKAR1A, PTCH1, PTEN, RAD50, RAD51C, RAD51D, RB1, RECQL4, RET, RNF43, RUNX1, SDHAF2, SDHA (sequence changes only), SDHB, SDHC, SDHD, SMAD4, SMARCA4, SMARCB1, SMARCE1, STK11, SUFU, TERC, TERT, TMEM127, TP53, TSC1, TSC2, VHL, WRN and WT1.   Amended report: The variant of uncertain significance (VUS) in [place] at  c.2825+6G>T (Intronic) has been reclassified to likely benign.  The change in variant classification was made as a result of re-review of evidence in light of new variant interpretation guidelines and/or new information. The amended report date is December 18, 2020.    02/10/2021 -  Radiation Therapy   Adjuvant radiation     CHIEF COMPLIANT: Herceptin maintenance  INTERVAL HISTORY: Monica [name] is a 50 y.o. with above-mentioned history of underwent a right lumpectomy followed by re-excision, adjuvant chemotherapy, and radiation, currently undergoing  Herceptin maintenance therapy.  She reports that after the last Herceptin treatment she felt very nauseated for couple of days and did not feel well.  She also felt fatigued.  This is never happened before and therefore she was wondering what happened with the Herceptin treatment.  ALLERGIES:  has No Known Allergies.  MEDICATIONS:  Current Outpatient Medications  Medication Sig Dispense Refill   albuterol (VENTOLIN HFA) 108 (90 Base) MCG/ACT inhaler Inhale into the lungs every 6 (six) hours as needed for wheezing or shortness of breath.     albuterol (VENTOLIN HFA) 108 (90 Base) MCG/ACT inhaler Inhale 2 puffs into the lungs every 4 (  four) hours as needed for wheezing or shortness of breath. 8 g 3   anastrozole (ARIMIDEX) 1 MG tablet Take 1 tablet (1 mg total) by mouth  daily. 90 tablet 3   Cholecalciferol (VITAMIN D3) 125 MCG (5000 UT) TABS Take 1 tablet by mouth daily.     ciprofloxacin (CILOXAN) 0.3 % ophthalmic solution Place 1 drop into both eyes every 2 (two) hours. Administer 1 drop, every 2 hours, while awake, for 2 days. Then 1 drop, every 4 hours, while awake, for the next 5 days. (Patient not taking: Reported on 01/21/2021) 10 mL 0   lidocaine-prilocaine (EMLA) cream Apply to affected area once 30 g 3   lisinopril (ZESTRIL) 10 MG tablet Take 1 tablet (10 mg total) by mouth daily.     montelukast (SINGULAIR) 10 MG tablet Take 10 mg by mouth daily.     ondansetron (ZOFRAN) 8 MG tablet Take 1 tablet (8 mg total) by mouth 2 (two) times daily as needed (Nausea or vomiting). 30 tablet 1   No current facility-administered medications for this visit.    PHYSICAL EXAMINATION: ECOG PERFORMANCE STATUS: 1 - Symptomatic but completely ambulatory  Vitals:   05/25/21 0832  BP: (!) 125/94  Pulse: 73  Resp: 18  Temp: 97.8 F (36.6 C)  SpO2: 100%   Filed Weights   05/25/21 0832  Weight: 195 lb 12.8 oz (88.8 kg)     LABORATORY DATA:  I have reviewed the data as listed CMP Latest Ref Rng & Units 05/05/2021 04/13/2021 03/02/2021  Glucose 70 - 99 mg/dL 103(H) 95 96  BUN 6 - 20 mg/dL _0 Creatinine 0.44 - 1.00 mg/dL 0.74 0.80 0.84  Sodium 135 - 145 mmol/L 140 140 139  Potassium 3.5 - 5.1 mmol/L 4.0 3.8 4.3  Chloride 98 - 111 mmol/L 107 106 107  CO2 22 - 32 mmol/L _1 Calcium 8.9 - 10.3 mg/dL 9.4 9.0 9.0  Total Protein 6.5 - 8.1 g/dL 7.0 7.2 7.5  Total Bilirubin 0.3 - 1.2 mg/dL 0.3 0.3 0.4  Alkaline Phos 38 - 126 U/L 105 126 116  AST 15 - 41 U/L _2 ALT 0 - 44 U/L _3 Lab Results  Component Value Date   WBC 6.7 05/25/2021   HGB 12.6 05/25/2021   HCT 36.0 05/25/2021   MCV 77.1 (L) 05/25/2021   PLT 230 05/25/2021   NEUTROABS 4.5 05/25/2021    ASSESSMENT & PLAN:  Primary malignant neoplasm of upper outer quadrant of  female breast, right ([place]) 09/10/2020:Right lumpectomy ([facility]): invasive lobular carcinoma, 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  ER 80%, PR 60%, Ki-67 15%, HER-2 equivocal by IHC positive for fish 10/11/20: Re-excision: Neg   Recommendation: 1. Adjuvant chemotherapy with Taxol Herceptin followed by Herceptin maintenance for 1 year 2. Adjuvant radiation therapy 02/10/21- 03/24/21 3. Adjuvant antiestrogen therapy with anastrozole (patient is postmenopausal) started 04/13/2021 and anti-HER-2 therapy with neratinib -------------------------------------------------------------------------------------------------------------------------------- Current treatment: Herceptin maintenance q 3 weeks with anastrozole    Anastrozole Toxicities: Tolerating it well without any problems.  Herceptin toxicities: 04/17/2021: EF 65% Nausea: Unclear etiology I will add Zofran to today's treatment and for the next few treatments. If her nausea resolves then we will discontinue it.  RTC every 3 weeks for Herceptin and every 6 weeks for follow up with me.     No orders of the defined types were placed in this encounter.  The patient has a good understanding of the overall plan. she agrees with it. she will call with any problems that may develop before the next visit here.  Total time spent: 30 mins including face to face time and time spent for planning, charting and coordination of care  Rulon Eisenmenger, MD, MPH 05/25/2021  I, Thana Ates, am acting as scribe for Dr. Nicholas Lose.  I have reviewed the above documentation for accuracy and completeness, and I agree with the above.

## 2021-05-24 NOTE — Assessment & Plan Note (Signed)
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 Adjuvant radiation therapy3/8/22- 03/24/21 3. Adjuvant antiestrogen therapywith anastrozole (patient is postmenopausal) started 04/13/2021 and anti-HER-2 therapy with neratinib -------------------------------------------------------------------------------------------------------------------------------- Current treatment: Herceptin maintenance q 3 weeks Start Anti estrogen therapy since radiation is complete  Anastrozole Toxicities:  RTC every 3 weeks for Herceptin and 1 6 weeks for follow up with me.

## 2021-05-25 ENCOUNTER — Other Ambulatory Visit: Payer: Self-pay

## 2021-05-25 ENCOUNTER — Ambulatory Visit: Payer: Medicaid Other

## 2021-05-25 ENCOUNTER — Inpatient Hospital Stay: Payer: Medicaid Other

## 2021-05-25 ENCOUNTER — Inpatient Hospital Stay: Payer: Medicaid Other | Attending: Hematology and Oncology | Admitting: Hematology and Oncology

## 2021-05-25 DIAGNOSIS — Z79899 Other long term (current) drug therapy: Secondary | ICD-10-CM | POA: Diagnosis not present

## 2021-05-25 DIAGNOSIS — Z170 Estrogen receptor positive status [ER+]: Secondary | ICD-10-CM | POA: Diagnosis not present

## 2021-05-25 DIAGNOSIS — C50411 Malignant neoplasm of upper-outer quadrant of right female breast: Secondary | ICD-10-CM

## 2021-05-25 DIAGNOSIS — Z5112 Encounter for antineoplastic immunotherapy: Secondary | ICD-10-CM | POA: Diagnosis present

## 2021-05-25 DIAGNOSIS — Z9221 Personal history of antineoplastic chemotherapy: Secondary | ICD-10-CM | POA: Diagnosis not present

## 2021-05-25 DIAGNOSIS — C773 Secondary and unspecified malignant neoplasm of axilla and upper limb lymph nodes: Secondary | ICD-10-CM | POA: Insufficient documentation

## 2021-05-25 DIAGNOSIS — R110 Nausea: Secondary | ICD-10-CM | POA: Diagnosis not present

## 2021-05-25 DIAGNOSIS — Z923 Personal history of irradiation: Secondary | ICD-10-CM | POA: Insufficient documentation

## 2021-05-25 LAB — CBC WITH DIFFERENTIAL (CANCER CENTER ONLY)
Abs Immature Granulocytes: 0.02 10*3/uL (ref 0.00–0.07)
Basophils Absolute: 0 10*3/uL (ref 0.0–0.1)
Basophils Relative: 1 %
Eosinophils Absolute: 0.2 10*3/uL (ref 0.0–0.5)
Eosinophils Relative: 3 %
HCT: 36 % (ref 36.0–46.0)
Hemoglobin: 12.6 g/dL (ref 12.0–15.0)
Immature Granulocytes: 0 %
Lymphocytes Relative: 19 %
Lymphs Abs: 1.3 10*3/uL (ref 0.7–4.0)
MCH: 27 pg (ref 26.0–34.0)
MCHC: 35 g/dL (ref 30.0–36.0)
MCV: 77.1 fL — ABNORMAL LOW (ref 80.0–100.0)
Monocytes Absolute: 0.6 10*3/uL (ref 0.1–1.0)
Monocytes Relative: 10 %
Neutro Abs: 4.5 10*3/uL (ref 1.7–7.7)
Neutrophils Relative %: 67 %
Platelet Count: 230 10*3/uL (ref 150–400)
RBC: 4.67 MIL/uL (ref 3.87–5.11)
RDW: 14.6 % (ref 11.5–15.5)
WBC Count: 6.7 10*3/uL (ref 4.0–10.5)
nRBC: 0 % (ref 0.0–0.2)

## 2021-05-25 LAB — CMP (CANCER CENTER ONLY)
ALT: 23 U/L (ref 0–44)
AST: 25 U/L (ref 15–41)
Albumin: 3.8 g/dL (ref 3.5–5.0)
Alkaline Phosphatase: 124 U/L (ref 38–126)
Anion gap: 3 — ABNORMAL LOW (ref 5–15)
BUN: 11 mg/dL (ref 6–20)
CO2: 29 mmol/L (ref 22–32)
Calcium: 8.9 mg/dL (ref 8.9–10.3)
Chloride: 108 mmol/L (ref 98–111)
Creatinine: 0.96 mg/dL (ref 0.44–1.00)
GFR, Estimated: >60 mL/min (ref >60)
Glucose, Bld: 96 mg/dL (ref 70–99)
Potassium: 3.8 mmol/L (ref 3.5–5.1)
Sodium: 140 mmol/L (ref 135–145)
Total Bilirubin: 0.4 mg/dL (ref 0.3–1.2)
Total Protein: 7.3 g/dL (ref 6.5–8.1)

## 2021-05-25 MED ORDER — TRASTUZUMAB-DKST CHEMO 150 MG IV SOLR
6.0000 mg/kg | Freq: Once | INTRAVENOUS | Status: AC
  Administered 2021-05-25: 504 mg via INTRAVENOUS
  Filled 2021-05-25: qty 24

## 2021-05-25 MED ORDER — DIPHENHYDRAMINE HCL 25 MG PO CAPS
50.0000 mg | ORAL_CAPSULE | Freq: Once | ORAL | Status: AC
Start: 2021-05-25 — End: 2021-05-25
  Administered 2021-05-25: 50 mg via ORAL

## 2021-05-25 MED ORDER — ONDANSETRON HCL 4 MG/2ML IJ SOLN
INTRAMUSCULAR | Status: AC
  Filled 2021-05-25: qty 4

## 2021-05-25 MED ORDER — ACETAMINOPHEN 325 MG PO TABS
650.0000 mg | ORAL_TABLET | Freq: Once | ORAL | Status: AC
  Administered 2021-05-25: 650 mg via ORAL

## 2021-05-25 MED ORDER — SODIUM CHLORIDE 0.9 % IV SOLN
Freq: Once | INTRAVENOUS | Status: AC
  Filled 2021-05-25: qty 250

## 2021-05-25 MED ORDER — ACETAMINOPHEN 325 MG PO TABS
ORAL_TABLET | ORAL | Status: AC
  Filled 2021-05-25: qty 2

## 2021-05-25 MED ORDER — ONDANSETRON HCL 4 MG/2ML IJ SOLN
8.0000 mg | Freq: Once | INTRAMUSCULAR | Status: AC
  Administered 2021-05-25: 8 mg via INTRAVENOUS

## 2021-05-25 MED ORDER — SODIUM CHLORIDE 0.9 % IV SOLN: 8.0000 mg | Freq: Once | INTRAVENOUS | Status: DC

## 2021-05-25 MED ORDER — EPOETIN ALFA-EPBX 40000 UNIT/ML IJ SOLN
INTRAMUSCULAR | Status: AC
  Filled 2021-05-25: qty 1

## 2021-05-25 MED ORDER — DIPHENHYDRAMINE HCL 25 MG PO CAPS
ORAL_CAPSULE | ORAL | Status: AC
  Filled 2021-05-25: qty 2

## 2021-05-25 MED ORDER — SODIUM CHLORIDE 0.9% FLUSH
10.0000 mL | INTRAVENOUS | Status: DC | PRN
  Filled 2021-05-25: qty 10

## 2021-05-25 NOTE — Patient Instructions (Signed)
[facility]  Discharge Instructions: Thank you for choosing [facility] to provide your oncology and hematology care.   If you have a lab appointment with the Cancer Center, please go directly to the Cancer Center and check in at the registration area.   Wear comfortable clothing and clothing appropriate for easy access to any Portacath or PICC line.   We strive to give you quality time with your provider. You may need to reschedule your appointment if you arrive late (15 or more minutes).  Arriving late affects you and other patients whose appointments are after yours.  Also, if you miss three or more appointments without notifying the office, you may be dismissed from the clinic at the provider's discretion.      For prescription refill requests, have your pharmacy contact our office and allow 72 hours for refills to be completed.    Today you received the following chemotherapy and/or immunotherapy agents trastuzumab      To help prevent nausea and vomiting after your treatment, we encourage you to take your nausea medication as directed.  BELOW ARE SYMPTOMS THAT SHOULD BE REPORTED IMMEDIATELY: *FEVER GREATER THAN 100.4 F (38 C) OR HIGHER *CHILLS OR SWEATING *NAUSEA AND VOMITING THAT IS NOT CONTROLLED WITH YOUR NAUSEA MEDICATION *UNUSUAL SHORTNESS OF BREATH *UNUSUAL BRUISING OR BLEEDING *URINARY PROBLEMS (pain or burning when urinating, or frequent urination) *BOWEL PROBLEMS (unusual diarrhea, constipation, pain near the anus) TENDERNESS IN MOUTH AND THROAT WITH OR WITHOUT PRESENCE OF ULCERS (sore throat, sores in mouth, or a toothache) UNUSUAL RASH, SWELLING OR PAIN  UNUSUAL VAGINAL DISCHARGE OR ITCHING   Items with * indicate a potential emergency and should be followed up as soon as possible or go to the Emergency Department if any problems should occur.  Please show the CHEMOTHERAPY ALERT CARD or IMMUNOTHERAPY ALERT CARD at check-in to  the Emergency Department and triage nurse.  Should you have questions after your visit or need to cancel or reschedule your appointment, please contact [facility]  Dept: [phone]  and follow the prompts.  Office hours are 8:00 a.m. to 4:30 p.m. Monday - Friday. Please note that voicemails left after 4:00 p.m. may not be returned until the following business day.  We are closed weekends and major holidays. You have access to a nurse at all times for urgent questions. Please call the main number to the clinic Dept: [phone] and follow the prompts.   For any non-urgent questions, you may also contact your provider using MyChart. We now offer e-Visits for anyone 18 and older to request care online for non-urgent symptoms. For details visit [email or web address].   Also download the MyChart app! Go to the app store, search "MyChart", open the app, select [facility], and log in with your MyChart username and password.  Due to Covid, a mask is required upon entering the hospital/clinic. If you do not have a mask, one will be given to you upon arrival. For doctor visits, patients may have 1 support person aged 18 or older with them. For treatment visits, patients cannot have anyone with them due to current Covid guidelines and our immunocompromised population.   

## 2021-06-15 ENCOUNTER — Inpatient Hospital Stay: Payer: Medicaid Other | Attending: Hematology and Oncology

## 2021-06-15 ENCOUNTER — Other Ambulatory Visit: Payer: Self-pay

## 2021-06-15 ENCOUNTER — Telehealth: Payer: Self-pay | Admitting: Hematology and Oncology

## 2021-06-15 ENCOUNTER — Other Ambulatory Visit: Payer: Self-pay | Admitting: *Deleted

## 2021-06-15 ENCOUNTER — Encounter: Payer: Self-pay | Admitting: *Deleted

## 2021-06-15 VITALS — BP 109/41 | HR 78 | Temp 98.3°F | Resp 18 | Wt 196.5 lb

## 2021-06-15 DIAGNOSIS — C50411 Malignant neoplasm of upper-outer quadrant of right female breast: Secondary | ICD-10-CM

## 2021-06-15 DIAGNOSIS — Z9221 Personal history of antineoplastic chemotherapy: Secondary | ICD-10-CM | POA: Diagnosis not present

## 2021-06-15 DIAGNOSIS — Z923 Personal history of irradiation: Secondary | ICD-10-CM | POA: Insufficient documentation

## 2021-06-15 DIAGNOSIS — Z170 Estrogen receptor positive status [ER+]: Secondary | ICD-10-CM | POA: Insufficient documentation

## 2021-06-15 DIAGNOSIS — Z5112 Encounter for antineoplastic immunotherapy: Secondary | ICD-10-CM | POA: Insufficient documentation

## 2021-06-15 DIAGNOSIS — Z79811 Long term (current) use of aromatase inhibitors: Secondary | ICD-10-CM | POA: Insufficient documentation

## 2021-06-15 DIAGNOSIS — C773 Secondary and unspecified malignant neoplasm of axilla and upper limb lymph nodes: Secondary | ICD-10-CM | POA: Insufficient documentation

## 2021-06-15 MED ORDER — DIPHENHYDRAMINE HCL 25 MG PO CAPS
50.0000 mg | ORAL_CAPSULE | Freq: Once | ORAL | Status: AC
  Administered 2021-06-15: 50 mg via ORAL

## 2021-06-15 MED ORDER — ACETAMINOPHEN 325 MG PO TABS
ORAL_TABLET | ORAL | Status: AC
  Filled 2021-06-15: qty 2

## 2021-06-15 MED ORDER — DIPHENHYDRAMINE HCL 25 MG PO CAPS
ORAL_CAPSULE | ORAL | Status: AC
  Filled 2021-06-15: qty 2

## 2021-06-15 MED ORDER — ONDANSETRON HCL 4 MG/2ML IJ SOLN
INTRAMUSCULAR | Status: AC
  Filled 2021-06-15: qty 4

## 2021-06-15 MED ORDER — ONDANSETRON HCL 4 MG/2ML IJ SOLN
8.0000 mg | Freq: Once | INTRAMUSCULAR | Status: AC
  Administered 2021-06-15: 8 mg via INTRAVENOUS

## 2021-06-15 MED ORDER — SODIUM CHLORIDE 0.9% FLUSH
10.0000 mL | INTRAVENOUS | Status: DC | PRN
  Administered 2021-06-15: 10 mL
  Filled 2021-06-15: qty 10

## 2021-06-15 MED ORDER — HEPARIN SOD (PORK) LOCK FLUSH 100 UNIT/ML IV SOLN
500.0000 [IU] | Freq: Once | INTRAVENOUS | Status: AC | PRN
  Administered 2021-06-15: 500 [IU]
  Filled 2021-06-15: qty 5

## 2021-06-15 MED ORDER — SODIUM CHLORIDE 0.9 % IV SOLN
Freq: Once | INTRAVENOUS | Status: AC
  Filled 2021-06-15: qty 250

## 2021-06-15 MED ORDER — SODIUM CHLORIDE 0.9 % IV SOLN
6.0000 mg/kg | Freq: Once | INTRAVENOUS | Status: AC
  Administered 2021-06-15: 504 mg via INTRAVENOUS
  Filled 2021-06-15: qty 24

## 2021-06-15 MED ORDER — ACETAMINOPHEN 325 MG PO TABS
650.0000 mg | ORAL_TABLET | Freq: Once | ORAL | Status: AC
  Administered 2021-06-15: 650 mg via ORAL

## 2021-06-15 NOTE — Patient Instructions (Signed)
[name]  Discharge Instructions: Thank you for choosing [facility] to provide your oncology and hematology care.   If you have a lab appointment with the Cancer Center, please go directly to the Cancer Center and check in at the registration area.   Wear comfortable clothing and clothing appropriate for easy access to any Portacath or PICC line.   We strive to give you quality time with your provider. You may need to reschedule your appointment if you arrive late (15 or more minutes).  Arriving late affects you and other patients whose appointments are after yours.  Also, if you miss three or more appointments without notifying the office, you may be dismissed from the clinic at the provider's discretion.      For prescription refill requests, have your pharmacy contact our office and allow 72 hours for refills to be completed.    Today you received the following chemotherapy and/or immunotherapy agents trastuzumab      To help prevent nausea and vomiting after your treatment, we encourage you to take your nausea medication as directed.  BELOW ARE SYMPTOMS THAT SHOULD BE REPORTED IMMEDIATELY: *FEVER GREATER THAN 100.4 F (38 C) OR HIGHER *CHILLS OR SWEATING *NAUSEA AND VOMITING THAT IS NOT CONTROLLED WITH YOUR NAUSEA MEDICATION *UNUSUAL SHORTNESS OF BREATH *UNUSUAL BRUISING OR BLEEDING *URINARY PROBLEMS (pain or burning when urinating, or frequent urination) *BOWEL PROBLEMS (unusual diarrhea, constipation, pain near the anus) TENDERNESS IN MOUTH AND THROAT WITH OR WITHOUT PRESENCE OF ULCERS (sore throat, sores in mouth, or a toothache) UNUSUAL RASH, SWELLING OR PAIN  UNUSUAL VAGINAL DISCHARGE OR ITCHING   Items with * indicate a potential emergency and should be followed up as soon as possible or go to the Emergency Department if any problems should occur.  Please show the CHEMOTHERAPY ALERT CARD or IMMUNOTHERAPY ALERT CARD at check-in to  the Emergency Department and triage nurse.  Should you have questions after your visit or need to cancel or reschedule your appointment, please contact [facility]  Dept: [phone]  and follow the prompts.  Office hours are 8:00 a.m. to 4:30 p.m. Monday - Friday. Please note that voicemails left after 4:00 p.m. may not be returned until the following business day.  We are closed weekends and major holidays. You have access to a nurse at all times for urgent questions. Please call the main number to the clinic Dept: [phone] and follow the prompts.   For any non-urgent questions, you may also contact your provider using MyChart. We now offer e-Visits for anyone 18 and older to request care online for non-urgent symptoms. For details visit [email or web address].   Also download the MyChart app! Go to the app store, search "MyChart", open the app, select [place], and log in with your MyChart username and password.  Due to Covid, a mask is required upon entering the hospital/clinic. If you do not have a mask, one will be given to you upon arrival. For doctor visits, patients may have 1 support person aged 18 or older with them. For treatment visits, patients cannot have anyone with them due to current Covid guidelines and our immunocompromised population.   

## 2021-06-15 NOTE — Telephone Encounter (Signed)
Scheduled appointment per 07/11 sch msg. Patient is aware. 

## 2021-06-17 NOTE — Progress Notes (Signed)
HEMATOLOGY-ONCOLOGY [facility] VIDEO VISIT PROGRESS NOTE  I connected with Monica [name] on 06/18/2021 at  8:45 AM EDT by MyChart video conference and verified that I am speaking with the correct person using two identifiers.  I discussed the limitations, risks, security and privacy concerns of performing an evaluation and management service by MyChart and the availability of in person appointments.  I also discussed with the patient that there may be a patient responsible charge related to this service. The patient expressed understanding and agreed to proceed.  Patient's Location: Home Physician Location: Clinic  CHIEF COMPLIANT: Follow-up of right breast cancer  INTERVAL HISTORY: Monica [name] is a 50 y.o. female with above-mentioned history of right breast cancer having underwent a right lumpectomy followed by re-excision, adjuvant chemotherapy, and radiation, currently undergoing  Herceptin maintenance therapy. She presents over MyChart today for follow-up.   Oncology History  Primary malignant neoplasm of upper outer quadrant of female breast, right ([place])  08/12/2020 Initial Diagnosis   Screening mammogram on 07/07/20 showed an asymmetry. Diagnostic mammogram and [place] on 07/29/20 showed a 0.8cm mass at the 10 o'clock position and no right axillary adenopathy. Biopsy on 08/12/20 showed invasive and in situ mammary carcinoma, grade 2, HER-2 equivocal by IHC, positive by FISH, ER+ 80%, PR+ 60%, Ki67 15%.   08/26/2020 Cancer Staging   Staging form: Breast, AJCC 8th Edition - Clinical stage from 08/26/2020: Stage IA (cT1b, cN0, cM0, G2, ER+, PR+, HER2+) - Signed by Eppie Gibson, MD on 08/27/2020    09/10/2020 Surgery   Right lumpectomy ([facility]): invasive lobular carcinoma, 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   11/03/2020 -  Chemotherapy    Patient is on  Treatment Plan: BREAST PACLITAXEL + TRASTUZUMAB Q7D / TRASTUZUMAB Q21D        Genetic Testing   Negative genetic testing: no pathogenic variants detected in [facility] Multi-Cancer Panel.  Variants of uncertain significance detected in ATM (c.131A>G (p.Asp44Gly) and c.4658A>C (p.Glu1553Ala)) and WRN (c.2825+6G>T (intronic)).  The report date is October 02, 2020.   The Multi-Cancer Panel offered by [facility] includes sequencing and/or deletion duplication testing of the following 85 genes: AIP, ALK, APC, ATM, AXIN2,BAP1,  BARD1, BLM, BMPR1A, BRCA1, BRCA2, BRIP1, CASR, CDC73, CDH1, CDK4, CDKN1B, CDKN1C, CDKN2A (p14ARF), CDKN2A (p16INK4a), CEBPA, CHEK2, CTNNA1, DICER1, DIS3L2, EGFR (c.2369C>T, p.Thr790Met variant only), EPCAM (Deletion/duplication testing only), FH, FLCN, GATA2, GPC3, GREM1 (Promoter region deletion/duplication testing only), HOXB13 (c.251G>A, p.Gly84Glu), HRAS, KIT, MAX, MEN1, MET, MITF (c.952G>A, p.Glu318Lys variant only), MLH1, MSH2, MSH3, MSH6, MUTYH, NBN, NF1, NF2, NTHL1, PALB2, PDGFRA, PHOX2B, PMS2, POLD1, POLE, POT1, PRKAR1A, PTCH1, PTEN, RAD50, RAD51C, RAD51D, RB1, RECQL4, RET, RNF43, RUNX1, SDHAF2, SDHA (sequence changes only), SDHB, SDHC, SDHD, SMAD4, SMARCA4, SMARCB1, SMARCE1, STK11, SUFU, TERC, TERT, TMEM127, TP53, TSC1, TSC2, VHL, WRN and WT1.   Amended report: The variant of uncertain significance (VUS) in [place] at  c.2825+6G>T (Intronic) has been reclassified to likely benign.  The change in variant classification was made as a result of re-review of evidence in light of new variant interpretation guidelines and/or new information. The amended report date is December 18, 2020.    02/10/2021 -  Radiation Therapy   Adjuvant radiation     Observations/Objective:  There were no vitals filed for this visit. There is no height or weight on file to calculate BMI.  I have reviewed the data as listed CMP Latest Ref Rng & Units 05/25/2021 05/05/2021 04/13/2021  Glucose 70 - 99 mg/dL 96  103(H) 95  BUN 6 - 20 mg/dL _0 Creatinine 0.44 - 1.00 mg/dL 0.96 0.74 0.80  Sodium 135 - 145 mmol/L 140 140 140  Potassium 3.5 - 5.1 mmol/L 3.8 4.0 3.8  Chloride 98 - 111 mmol/L 108 107 106  CO2 22 - 32 mmol/L _1 Calcium 8.9 - 10.3 mg/dL 8.9 9.4 9.0  Total Protein 6.5 - 8.1 g/dL 7.3 7.0 7.2  Total Bilirubin 0.3 - 1.2 mg/dL 0.4 0.3 0.3  Alkaline Phos 38 - 126 U/L 124 105 126  AST 15 - 41 U/L _2 ALT 0 - 44 U/L _3 Lab Results  Component Value Date   WBC 6.7 05/25/2021   HGB 12.6 05/25/2021   HCT 36.0 05/25/2021   MCV 77.1 (L) 05/25/2021   PLT 230 05/25/2021   NEUTROABS 4.5 05/25/2021      Assessment Plan:  Primary malignant neoplasm of upper outer quadrant of female breast, right ([place]) 09/10/2020:Right lumpectomy ([facility]): invasive lobular carcinoma, 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  ER 80%, PR 60%, Ki-67 15%, HER-2 equivocal by IHC positive for fish 10/11/20: Re-excision: Neg   Recommendation: 1. Adjuvant chemotherapy with Taxol Herceptin followed by Herceptin maintenance for 1 year 2. Adjuvant radiation therapy 02/10/21- 03/24/21 3. Adjuvant antiestrogen therapy with anastrozole (patient is postmenopausal) started 04/13/2021 and anti-HER-2 therapy with neratinib -------------------------------------------------------------------------------------------------------------------------------- Current treatment: Herceptin maintenance q 3 weeks with anastrozole, switched to letrozole 07/06/2021    Anastrozole Toxicities: Profound fatigue I instructed her to stop anastrozole and switch to letrozole in 2 weeks starting August 1.   Herceptin toxicities: 04/17/2021: EF 65% Nausea issues: Added Zofran to infusions    I discussed the assessment and treatment plan with the patient. The patient was provided an opportunity to ask questions and all were answered. The patient agreed with the plan and  demonstrated an understanding of the instructions. The patient was advised to call back or seek an in-person evaluation if the symptoms worsen or if the condition fails to improve as anticipated.   Total time spent: 20 minutes including face-to-face MyChart video visit time and time spent for planning, charting and coordination of care  Rulon Eisenmenger, MD 06/18/2021  I, Thana Ates am acting as scribe for Nicholas Lose, MD.  I have reviewed the above documentation for accuracy and completeness, and I agree with the above.

## 2021-06-18 ENCOUNTER — Telehealth (HOSPITAL_BASED_OUTPATIENT_CLINIC_OR_DEPARTMENT_OTHER): Payer: Medicaid Other | Admitting: Hematology and Oncology

## 2021-06-18 DIAGNOSIS — C50411 Malignant neoplasm of upper-outer quadrant of right female breast: Secondary | ICD-10-CM

## 2021-06-18 MED ORDER — LETROZOLE 2.5 MG PO TABS: 2.5000 mg | ORAL_TABLET | Freq: Every day | ORAL | 0 refills | Status: DC

## 2021-06-18 NOTE — Assessment & Plan Note (Signed)
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 Adjuvant radiation therapy3/8/22-4/19/22 3. Adjuvant antiestrogen therapywith anastrozole (patient is postmenopausal)started 04/13/2021 and anti-HER-2 therapy with neratinib -------------------------------------------------------------------------------------------------------------------------------- Current treatment:Herceptin maintenance q 3 weeks with anastrozole   Anastrozole Toxicities: Tolerating it well without any problems.  Herceptin toxicities: 04/17/2021: EF 65% Nausea issues: Added Zofran to infusions

## 2021-07-04 NOTE — Progress Notes (Signed)
Patient Care Team: Raiford Simmonds., PA-C as PCP - General  DIAGNOSIS:    ICD-10-CM   1. Primary malignant neoplasm of upper outer quadrant of female breast, right ([place])  C50.411       SUMMARY OF ONCOLOGIC HISTORY: Oncology History  Primary malignant neoplasm of upper outer quadrant of female breast, right ([place])  08/12/2020 Initial Diagnosis   Screening mammogram on 07/07/20 showed an asymmetry. Diagnostic mammogram and [place] on 07/29/20 showed a 0.8cm mass at the 10 o'clock position and no right axillary adenopathy. Biopsy on 08/12/20 showed invasive and in situ mammary carcinoma, grade 2, HER-2 equivocal by IHC, positive by FISH, ER+ 80%, PR+ 60%, Ki67 15%.   08/26/2020 Cancer Staging   Staging form: Breast, AJCC 8th Edition - Clinical stage from 08/26/2020: Stage IA (cT1b, cN0, cM0, G2, ER+, PR+, HER2+) - Signed by Eppie Gibson, MD on 08/27/2020    09/10/2020 Surgery   Right lumpectomy ([facility]): invasive lobular carcinoma, 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  Variants of uncertain significance detected in ATM (c.131A>G (p.Asp44Gly) and c.4658A>C (p.Glu1553Ala)) and WRN (c.2825+6G>T (intronic)).  The report date is October 02, 2020.   The Multi-Cancer Panel offered by [facility] includes sequencing and/or deletion duplication testing of the following 85 genes: AIP, ALK, APC, ATM, AXIN2,BAP1,  BARD1, BLM, BMPR1A, BRCA1, BRCA2, BRIP1, CASR, CDC73, CDH1, CDK4, CDKN1B, CDKN1C, CDKN2A (p14ARF), CDKN2A (p16INK4a), CEBPA, CHEK2, CTNNA1, DICER1, DIS3L2, EGFR (c.2369C>T, p.Thr790Met variant only), EPCAM (Deletion/duplication testing only), FH, FLCN,  GATA2, GPC3, GREM1 (Promoter region deletion/duplication testing only), HOXB13 (c.251G>A, p.Gly84Glu), HRAS, KIT, MAX, MEN1, MET, MITF (c.952G>A, p.Glu318Lys variant only), MLH1, MSH2, MSH3, MSH6, MUTYH, NBN, NF1, NF2, NTHL1, PALB2, PDGFRA, PHOX2B, PMS2, POLD1, POLE, POT1, PRKAR1A, PTCH1, PTEN, RAD50, RAD51C, RAD51D, RB1, RECQL4, RET, RNF43, RUNX1, SDHAF2, SDHA (sequence changes only), SDHB, SDHC, SDHD, SMAD4, SMARCA4, SMARCB1, SMARCE1, STK11, SUFU, TERC, TERT, TMEM127, TP53, TSC1, TSC2, VHL, WRN and WT1.   Amended report: The variant of uncertain significance (VUS) in [place] at  c.2825+6G>T (Intronic) has been reclassified to likely benign.  The change in variant classification was made as a result of re-review of evidence in light of new variant interpretation guidelines and/or new information. The amended report date is December 18, 2020.    02/10/2021 -  Radiation Therapy   Adjuvant radiation     CHIEF COMPLIANT:  Herceptin maintenance  INTERVAL HISTORY: Monica [name] is a 50 y.o. with above-mentioned history of underwent a right lumpectomy followed by re-excision, adjuvant chemotherapy, and radiation, currently undergoing Herceptin maintenance therapy. She presents to the clinic today for treatment.  She tolerated Herceptin fairly well except for some mild fatigue.  She tells me that since she stopped anastrozole her energy levels have improved significantly.  She started on letrozole today.  ALLERGIES:  has No Known Allergies.  MEDICATIONS:  Current Outpatient Medications  Medication Sig Dispense Refill   albuterol (VENTOLIN HFA) 108 (90 Base) MCG/ACT inhaler Inhale into the lungs every 6 (six) hours as needed for wheezing or shortness of breath.     albuterol (VENTOLIN HFA) 108 (90 Base) MCG/ACT inhaler Inhale 2 puffs into the lungs every 4 (four) hours as needed  for wheezing or shortness of breath. 8 g 3   Cholecalciferol (VITAMIN D3) 125 MCG (5000 UT) TABS Take 1 tablet by mouth  daily.     ciprofloxacin (CILOXAN) 0.3 % ophthalmic solution Place 1 drop into both eyes every 2 (two) hours. Administer 1 drop, every 2 hours, while awake, for 2 days. Then 1 drop, every 4 hours, while awake, for the next 5 days. (Patient not taking: Reported on 01/21/2021) 10 mL 0   letrozole (FEMARA) 2.5 MG tablet Take 1 tablet (2.5 mg total) by mouth daily. 30 tablet 0   lidocaine-prilocaine (EMLA) cream Apply to affected area once 30 g 3   lisinopril (ZESTRIL) 10 MG tablet Take 1 tablet (10 mg total) by mouth daily.     montelukast (SINGULAIR) 10 MG tablet Take 10 mg by mouth daily.     ondansetron (ZOFRAN) 8 MG tablet Take 1 tablet (8 mg total) by mouth 2 (two) times daily as needed (Nausea or vomiting). 30 tablet 1   No current facility-administered medications for this visit.   Facility-Administered Medications Ordered in Other Visits  Medication Dose Route Frequency Provider Last Rate Last Admin   sodium chloride flush (NS) 0.9 % injection 10 mL  10 mL Intracatheter PRN Nicholas Lose, MD   10 mL at 07/06/21 1529    PHYSICAL EXAMINATION: ECOG PERFORMANCE STATUS: 1 - Symptomatic but completely ambulatory  There were no vitals filed for this visit. Filed Weights   07/06/21 1547  Weight: 196 lb 11.2 oz (89.2 kg)    LABORATORY DATA:  I have reviewed the data as listed CMP Latest Ref Rng & Units 07/06/2021 05/25/2021 05/05/2021  Glucose 70 - 99 mg/dL 108(H) 96 103(H)  BUN 6 - 20 mg/dL 10 11 12   Creatinine 0.44 - 1.00 mg/dL 1.05(H) 0.96 0.74  Sodium 135 - 145 mmol/L 139 140 140  Potassium 3.5 - 5.1 mmol/L 3.8 3.8 4.0  Chloride 98 - 111 mmol/L 108 108 107  CO2 22 - 32 mmol/L 25 29 25   Calcium 8.9 - 10.3 mg/dL 9.2 8.9 9.4  Total Protein 6.5 - 8.1 g/dL 6.9 7.3 7.0  Total Bilirubin 0.3 - 1.2 mg/dL 0.6 0.4 0.3  Alkaline Phos 38 - 126 U/L 102 124 105  AST 15 - 41 U/L 32 25 26  ALT 0 - 44 U/L 27 23 17     Lab Results  Component Value Date   WBC 6.0 07/06/2021   HGB 12.4 07/06/2021    HCT 34.3 (L) 07/06/2021   MCV 75.7 (L) 07/06/2021   PLT 225 07/06/2021   NEUTROABS 4.0 07/06/2021    ASSESSMENT & PLAN:  Primary malignant neoplasm of upper outer quadrant of female breast, right ([place]) 09/10/2020:Right lumpectomy ([facility]): invasive lobular carcinoma, 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  ER 80%, PR 60%, Ki-67 15%, HER-2 equivocal by IHC positive for fish 10/11/20: Re-excision: Neg   Recommendation: 1. Adjuvant chemotherapy with Taxol Herceptin followed by Herceptin maintenance for 1 year 2. Adjuvant radiation therapy 02/10/21- 03/24/21 3. Adjuvant antiestrogen therapy with anastrozole (patient is postmenopausal) started 04/13/2021 and anti-HER-2 therapy with neratinib -------------------------------------------------------------------------------------------------------------------------------- Current treatment: Herceptin maintenance q 3 weeks with anastrozole, switched to letrozole 07/06/2021    Anastrozole Toxicities: Profound fatigue  Switched to letrozole   Herceptin toxicities: 04/17/2021: EF 65% Nausea issues: Added Zofran to infusions Return to clinic every 3 weeks for Herceptin every 6 weeks and follow-up with me.   No orders of the defined types  were placed in this encounter.  The patient has a good understanding of the overall plan. she agrees with it. she will call with any problems that may develop before the next visit here.  Total time spent: 30 mins including face to face time and time spent for planning, charting and coordination of care  Rulon Eisenmenger, MD, MPH 07/06/2021  I, Thana Ates, am acting as scribe for Dr. Nicholas Lose.  I have reviewed the above documentation for accuracy and completeness, and I agree with the above.

## 2021-07-06 ENCOUNTER — Ambulatory Visit (HOSPITAL_COMMUNITY): Payer: Medicaid Other

## 2021-07-06 ENCOUNTER — Inpatient Hospital Stay: Payer: Medicaid Other | Attending: Hematology and Oncology | Admitting: Hematology and Oncology

## 2021-07-06 ENCOUNTER — Ambulatory Visit: Payer: Medicaid Other

## 2021-07-06 ENCOUNTER — Inpatient Hospital Stay: Payer: Medicaid Other

## 2021-07-06 ENCOUNTER — Other Ambulatory Visit: Payer: Medicaid Other

## 2021-07-06 ENCOUNTER — Other Ambulatory Visit: Payer: Self-pay

## 2021-07-06 ENCOUNTER — Ambulatory Visit: Payer: Medicaid Other | Admitting: Hematology and Oncology

## 2021-07-06 VITALS — BP 117/78 | HR 71 | Temp 98.1°F | Resp 18

## 2021-07-06 DIAGNOSIS — Z170 Estrogen receptor positive status [ER+]: Secondary | ICD-10-CM | POA: Insufficient documentation

## 2021-07-06 DIAGNOSIS — Z5112 Encounter for antineoplastic immunotherapy: Secondary | ICD-10-CM | POA: Insufficient documentation

## 2021-07-06 DIAGNOSIS — C50411 Malignant neoplasm of upper-outer quadrant of right female breast: Secondary | ICD-10-CM | POA: Insufficient documentation

## 2021-07-06 DIAGNOSIS — Z79899 Other long term (current) drug therapy: Secondary | ICD-10-CM | POA: Diagnosis not present

## 2021-07-06 DIAGNOSIS — Z95828 Presence of other vascular implants and grafts: Secondary | ICD-10-CM

## 2021-07-06 DIAGNOSIS — C773 Secondary and unspecified malignant neoplasm of axilla and upper limb lymph nodes: Secondary | ICD-10-CM | POA: Insufficient documentation

## 2021-07-06 LAB — CBC WITH DIFFERENTIAL (CANCER CENTER ONLY)
Abs Immature Granulocytes: 0.02 10*3/uL (ref 0.00–0.07)
Basophils Absolute: 0 10*3/uL (ref 0.0–0.1)
Basophils Relative: 0 %
Eosinophils Absolute: 0.2 10*3/uL (ref 0.0–0.5)
Eosinophils Relative: 3 %
HCT: 34.3 % — ABNORMAL LOW (ref 36.0–46.0)
Hemoglobin: 12.4 g/dL (ref 12.0–15.0)
Immature Granulocytes: 0 %
Lymphocytes Relative: 22 %
Lymphs Abs: 1.3 10*3/uL (ref 0.7–4.0)
MCH: 27.4 pg (ref 26.0–34.0)
MCHC: 36.2 g/dL — ABNORMAL HIGH (ref 30.0–36.0)
MCV: 75.7 fL — ABNORMAL LOW (ref 80.0–100.0)
Monocytes Absolute: 0.5 10*3/uL (ref 0.1–1.0)
Monocytes Relative: 8 %
Neutro Abs: 4 10*3/uL (ref 1.7–7.7)
Neutrophils Relative %: 67 %
Platelet Count: 225 10*3/uL (ref 150–400)
RBC: 4.53 MIL/uL (ref 3.87–5.11)
RDW: 14.5 % (ref 11.5–15.5)
WBC Count: 6 10*3/uL (ref 4.0–10.5)
nRBC: 0 % (ref 0.0–0.2)

## 2021-07-06 LAB — CMP (CANCER CENTER ONLY)
ALT: 27 U/L (ref 0–44)
AST: 32 U/L (ref 15–41)
Albumin: 3.5 g/dL (ref 3.5–5.0)
Alkaline Phosphatase: 102 U/L (ref 38–126)
Anion gap: 6 (ref 5–15)
BUN: 10 mg/dL (ref 6–20)
CO2: 25 mmol/L (ref 22–32)
Calcium: 9.2 mg/dL (ref 8.9–10.3)
Chloride: 108 mmol/L (ref 98–111)
Creatinine: 1.05 mg/dL — ABNORMAL HIGH (ref 0.44–1.00)
GFR, Estimated: >60 mL/min (ref >60)
Glucose, Bld: 108 mg/dL — ABNORMAL HIGH (ref 70–99)
Potassium: 3.8 mmol/L (ref 3.5–5.1)
Sodium: 139 mmol/L (ref 135–145)
Total Bilirubin: 0.6 mg/dL (ref 0.3–1.2)
Total Protein: 6.9 g/dL (ref 6.5–8.1)

## 2021-07-06 MED ORDER — HEPARIN SOD (PORK) LOCK FLUSH 100 UNIT/ML IV SOLN
500.0000 [IU] | Freq: Once | INTRAVENOUS | Status: AC | PRN
  Administered 2021-07-06: 500 [IU]
  Filled 2021-07-06: qty 5

## 2021-07-06 MED ORDER — SODIUM CHLORIDE 0.9 % IV SOLN
Freq: Once | INTRAVENOUS | Status: AC
  Filled 2021-07-06: qty 250

## 2021-07-06 MED ORDER — ACETAMINOPHEN 325 MG PO TABS
650.0000 mg | ORAL_TABLET | Freq: Once | ORAL | Status: AC
  Administered 2021-07-06: 650 mg via ORAL

## 2021-07-06 MED ORDER — DIPHENHYDRAMINE HCL 25 MG PO CAPS
ORAL_CAPSULE | ORAL | Status: AC
  Filled 2021-07-06: qty 2

## 2021-07-06 MED ORDER — ONDANSETRON HCL 4 MG/2ML IJ SOLN
8.0000 mg | Freq: Once | INTRAMUSCULAR | Status: AC
Start: 2021-07-06 — End: 2021-07-06
  Administered 2021-07-06: 8 mg via INTRAVENOUS

## 2021-07-06 MED ORDER — SODIUM CHLORIDE 0.9% FLUSH
10.0000 mL | INTRAVENOUS | Status: DC | PRN
  Administered 2021-07-06: 10 mL
  Filled 2021-07-06: qty 10

## 2021-07-06 MED ORDER — TRASTUZUMAB-DKST CHEMO 150 MG IV SOLR
6.0000 mg/kg | Freq: Once | INTRAVENOUS | Status: AC
  Administered 2021-07-06: 504 mg via INTRAVENOUS
  Filled 2021-07-06: qty 24

## 2021-07-06 MED ORDER — ONDANSETRON HCL 4 MG/2ML IJ SOLN
INTRAMUSCULAR | Status: AC
  Filled 2021-07-06: qty 4

## 2021-07-06 MED ORDER — SODIUM CHLORIDE 0.9% FLUSH
10.0000 mL | Freq: Once | INTRAVENOUS | Status: AC
  Administered 2021-07-06: 10 mL
  Filled 2021-07-06: qty 10

## 2021-07-06 MED ORDER — DIPHENHYDRAMINE HCL 25 MG PO CAPS
50.0000 mg | ORAL_CAPSULE | Freq: Once | ORAL | Status: AC
  Administered 2021-07-06: 50 mg via ORAL

## 2021-07-06 MED ORDER — ACETAMINOPHEN 325 MG PO TABS
ORAL_TABLET | ORAL | Status: AC
  Filled 2021-07-06: qty 2

## 2021-07-06 NOTE — Patient Instructions (Signed)
[facility] MEDICAL ONCOLOGY  Discharge Instructions: Thank you for choosing [facility] to provide your oncology and hematology care.   If you have a lab appointment with the Cancer Center, please go directly to the Cancer Center and check in at the registration area.   Wear comfortable clothing and clothing appropriate for easy access to any Portacath or PICC line.   We strive to give you quality time with your provider. You may need to reschedule your appointment if you arrive late (15 or more minutes).  Arriving late affects you and other patients whose appointments are after yours.  Also, if you miss three or more appointments without notifying the office, you may be dismissed from the clinic at the provider's discretion.      For prescription refill requests, have your pharmacy contact our office and allow 72 hours for refills to be completed.    Today you received the following chemotherapy and/or immunotherapy agents : Herceptin     To help prevent nausea and vomiting after your treatment, we encourage you to take your nausea medication as directed.  BELOW ARE SYMPTOMS THAT SHOULD BE REPORTED IMMEDIATELY: *FEVER GREATER THAN 100.4 F (38 C) OR HIGHER *CHILLS OR SWEATING *NAUSEA AND VOMITING THAT IS NOT CONTROLLED WITH YOUR NAUSEA MEDICATION *UNUSUAL SHORTNESS OF BREATH *UNUSUAL BRUISING OR BLEEDING *URINARY PROBLEMS (pain or burning when urinating, or frequent urination) *BOWEL PROBLEMS (unusual diarrhea, constipation, pain near the anus) TENDERNESS IN MOUTH AND THROAT WITH OR WITHOUT PRESENCE OF ULCERS (sore throat, sores in mouth, or a toothache) UNUSUAL RASH, SWELLING OR PAIN  UNUSUAL VAGINAL DISCHARGE OR ITCHING   Items with * indicate a potential emergency and should be followed up as soon as possible or go to the Emergency Department if any problems should occur.  Please show the CHEMOTHERAPY ALERT CARD or IMMUNOTHERAPY ALERT CARD at check-in to  the Emergency Department and triage nurse.  Should you have questions after your visit or need to cancel or reschedule your appointment, please contact [facility]ICAL [facility]COL[facility]  Dept: [phone]  and follow the prompts.  Office hours are 8:00 a.m. to 4:30 p.m. Monday - Friday. Please note that voicemails left after 4:00 p.m. may not be returned until the following business day.  We are closed weekends and major holidays. You have access to a nurse at all times for urgent questions. Please call the main number to the clinic Dept: [phone] and follow the prompts.   For any non-urgent questions, you may also contact your provider using MyChart. We now offer e-Visits for anyone 18 and older to request care online for non-urgent symptoms. For details visit [email or web address].   Also download the MyChart app! Go to the app store, search "MyChart", open the app, select [facility], and log in with your MyChart username and password.  Due to Covid, a mask is required upon entering the hospital/clinic. If you do not have a mask, one will be given to you upon arrival. For doctor visits, patients may have 1 support person aged 18 or older with them. For treatment visits, patients cannot have anyone with them due to current Covid guidelines and our immunocompromised population.   

## 2021-07-06 NOTE — Assessment & Plan Note (Signed)
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 Adjuvant radiation therapy3/8/22-4/19/22 3. Adjuvant antiestrogen therapywith anastrozole (patient is postmenopausal)started 04/13/2021 and anti-HER-2 therapy with neratinib -------------------------------------------------------------------------------------------------------------------------------- Current treatment:Herceptin maintenance q 3 weekswith anastrozole, switched to letrozole 07/06/2021  AnastrozoleToxicities:Profound fatigue I instructed her to stop anastrozole and switch to letrozole in 2 weeks starting August 1.  Herceptin toxicities: 04/17/2021: EF 65% Nausea issues: Added Zofran to infusions

## 2021-07-10 ENCOUNTER — Other Ambulatory Visit: Payer: Self-pay | Admitting: Hematology and Oncology

## 2021-07-14 ENCOUNTER — Emergency Department (HOSPITAL_COMMUNITY): Payer: Medicaid Other

## 2021-07-14 ENCOUNTER — Other Ambulatory Visit: Payer: Self-pay

## 2021-07-14 ENCOUNTER — Encounter (HOSPITAL_COMMUNITY): Payer: Self-pay

## 2021-07-14 ENCOUNTER — Emergency Department (HOSPITAL_COMMUNITY)
Admission: EM | Admit: 2021-07-14 | Discharge: 2021-07-14 | Disposition: A | Discharge status: Home / Self Care | Payer: Medicaid Other | Attending: Emergency Medicine | Admitting: Emergency Medicine

## 2021-07-14 DIAGNOSIS — J680 Bronchitis and pneumonitis due to chemicals, gases, fumes and vapors: Secondary | ICD-10-CM | POA: Diagnosis not present

## 2021-07-14 DIAGNOSIS — R0981 Nasal congestion: Secondary | ICD-10-CM | POA: Diagnosis not present

## 2021-07-14 DIAGNOSIS — Z87891 Personal history of nicotine dependence: Secondary | ICD-10-CM | POA: Diagnosis not present

## 2021-07-14 DIAGNOSIS — R059 Cough, unspecified: Secondary | ICD-10-CM | POA: Insufficient documentation

## 2021-07-14 DIAGNOSIS — Z79899 Other long term (current) drug therapy: Secondary | ICD-10-CM | POA: Diagnosis not present

## 2021-07-14 DIAGNOSIS — I10 Essential (primary) hypertension: Secondary | ICD-10-CM | POA: Diagnosis not present

## 2021-07-14 DIAGNOSIS — Z853 Personal history of malignant neoplasm of breast: Secondary | ICD-10-CM | POA: Diagnosis not present

## 2021-07-14 DIAGNOSIS — T5991XA Toxic effect of unspecified gases, fumes and vapors, accidental (unintentional), initial encounter: Secondary | ICD-10-CM | POA: Insufficient documentation

## 2021-07-14 DIAGNOSIS — R0602 Shortness of breath: Secondary | ICD-10-CM | POA: Insufficient documentation

## 2021-07-14 IMAGING — DX DG CHEST 1V PORT
1 series · 1 of 1 positions shown · non-contrast
Comparison: [DATE]

CLINICAL DATA: Cough, shortness of breath

EXAM:
PORTABLE CHEST 1 VIEW

[chest ap]
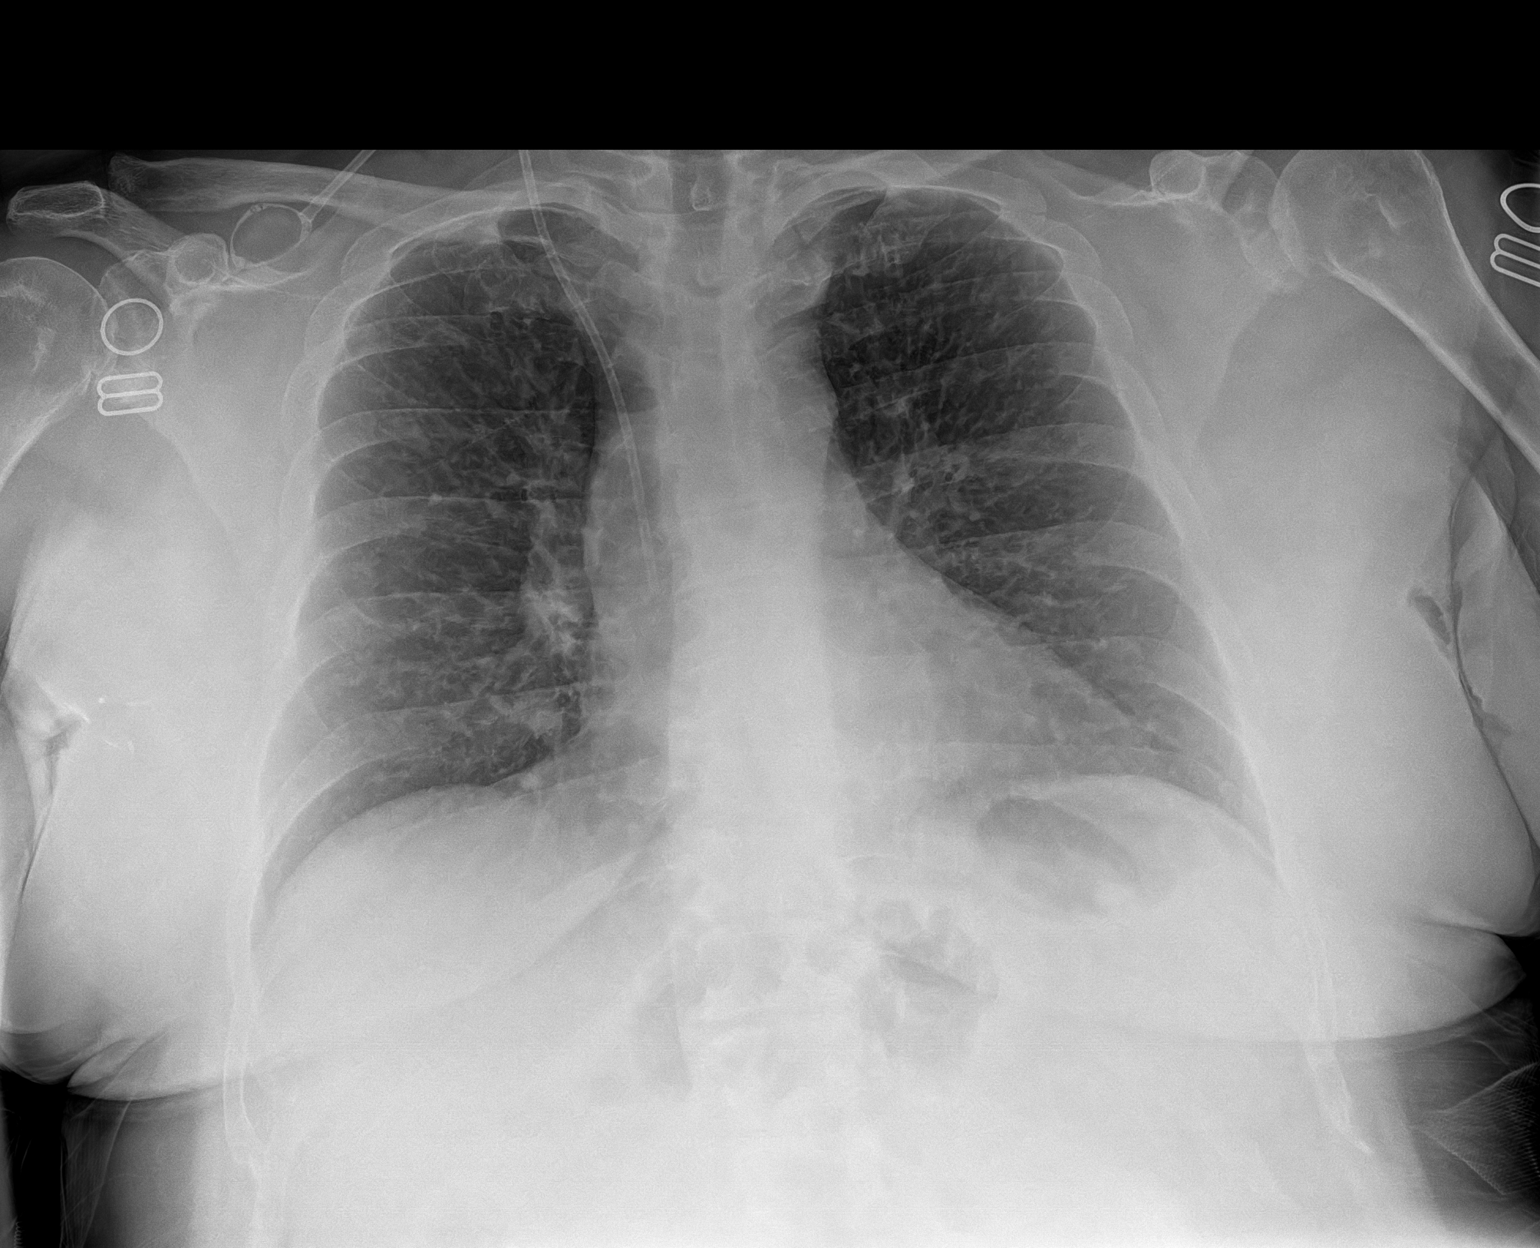

[1 of 1 positions shown; findings below may reference images not displayed]

FINDINGS: Right Port-A-Cath remains in place, unchanged. Heart and mediastinal
contours are within normal limits. No focal opacities or effusions.
No acute bony abnormality.
IMPRESSION: No active disease.

## 2021-07-14 MED ORDER — DEXAMETHASONE SODIUM PHOSPHATE 10 MG/ML IJ SOLN
10.0000 mg | Freq: Once | INTRAMUSCULAR | Status: AC
  Administered 2021-07-14: 10 mg via INTRAMUSCULAR
  Filled 2021-07-14: qty 1

## 2021-07-14 MED ORDER — DEXAMETHASONE 6 MG PO TABS: 6.0000 mg | ORAL_TABLET | Freq: Every day | ORAL | 0 refills | Status: AC

## 2021-07-14 MED ORDER — AEROCHAMBER Z-STAT PLUS/MEDIUM MISC
1.0000 | Freq: Once | Status: AC
  Administered 2021-07-14: 1

## 2021-07-14 MED ORDER — ALBUTEROL SULFATE HFA 108 (90 BASE) MCG/ACT IN AERS
3.0000 | INHALATION_SPRAY | Freq: Once | RESPIRATORY_TRACT | Status: AC
  Administered 2021-07-14: 3 via RESPIRATORY_TRACT
  Filled 2021-07-14: qty 6.7

## 2021-07-14 NOTE — Discharge Instructions (Addendum)
Your chest x-ray is clear today.  I suspect your symptoms are triggered by your recent exposure to cleaning chemicals.  Continue using the albuterol inhaler given taking 2 puffs every 4 hours if needed for cough, wheezing or shortness of breath.  Take the full course of the Decadron which is a steroid and should help resolve the inflammation of your airways, take your next dose of this tomorrow.

## 2021-07-14 NOTE — ED Provider Notes (Signed)
[facility] EMERGENCY DEPARTMENT Provider Note   CSN: [id number] Arrival date & time: 07/14/21  1616     History Chief Complaint  Patient presents with   Nasal Congestion    Monica [name] is a 50 y.o. female with a history of asthma, hypertension and breast cancer treated by lumpectomy and currently receiving chemotherapy presenting for evaluation of cough and wheezing with shortness of breath which started after she was exposed to cleaning fumes in her home over the weekend.  She states a friend came to help clean her home and was using [place] and bleach and states there was a strong chemical odor in her home after which she started wheezing and coughing.  She has had no fevers or chills.  Her cough has been nonproductive.  She does endorse wheezing which has been intermittent.  She denies COVID exposures.  She denies upper respiratory symptoms including nasal congestion or sinus issues.  She states that she usually has an asthma flare every year about this time.  She has had no treatments prior to arrival.  The history is provided by the patient.      Past Medical History:  Diagnosis Date   Asthma    Family history of breast cancer 09/22/2020   Hypertension     Patient Active Problem List   Diagnosis Date Noted   Port-A-Cath in place 12/29/2020   Genetic testing 10/08/2020   Family history of breast cancer 09/22/2020   Primary malignant neoplasm of upper outer quadrant of female breast, right ([place]) 08/27/2020    Past Surgical History:  Procedure Laterality Date   ABDOMINAL HYSTERECTOMY     APPENDECTOMY     BREAST LUMPECTOMY WITH RADIOACTIVE SEED AND SENTINEL LYMPH NODE BIOPSY Right 09/10/2020   Procedure: RIGHT BREAST LUMPECTOMY WITH RADIOACTIVE SEED AND SENTINEL LYMPH NODE MAPPING;  Surgeon: Erroll Luna, MD;  Location: [facility];  Service: General;  Laterality: Right;   PORTACATH PLACEMENT Right 10/16/2020   Procedure: INSERTION PORT-A-CATH  WITH ULTRASOUND GUIDANCE;  Surgeon: Erroll Luna, MD;  Location: [facility];  Service: General;  Laterality: Right;   RE-EXCISION OF BREAST LUMPECTOMY Right 10/16/2020   Procedure: RE-EXCISION OF RIGHT BREAST LUMPECTOMY;  Surgeon: Erroll Luna, MD;  Location: [facility];  Service: General;  Laterality: Right;     OB History   No obstetric history on file.     Family History  Problem Relation Age of Onset   Hypertension Father    Hypertension Sister    Breast cancer Maternal Aunt 62   Breast cancer Paternal Aunt        dx at unknown age   Hypertension Maternal Grandmother    Breast cancer Other        MGM's niece; dx 31s   Breast cancer Other        MGF's niece    Social History   Tobacco Use   Smoking status: Former    Packs/day: 0.50    Types: Cigarettes    Quit date: 08/22/2020    Years since quitting: 0.8   Smokeless tobacco: Never  Vaping Use   Vaping Use: Never used  Substance Use Topics   Alcohol use: No   Drug use: Never    Home Medications Prior to Admission medications   Medication Sig Start Date End Date Taking? Authorizing Provider  dexamethasone (DECADRON) 6 MG tablet Take 1 tablet (6 mg total) by mouth daily for 5 days. 07/14/21 07/19/21 Yes Evalee Jefferson, PA-C  albuterol (VENTOLIN HFA) 108 (90 Base) MCG/ACT inhaler Inhale into the lungs every 6 (six) hours as needed for wheezing or shortness of breath.    [provider]  albuterol (VENTOLIN HFA) 108 (90 Base) MCG/ACT inhaler Inhale 2 puffs into the lungs every 4 (four) hours as needed for wheezing or shortness of breath. 10/05/20   Wyvonnia Dusky, MD  Cholecalciferol (VITAMIN D3) 125 MCG (5000 UT) TABS Take 1 tablet by mouth daily. 05/14/20   [provider]  ciprofloxacin (CILOXAN) 0.3 % ophthalmic solution Place 1 drop into both eyes every 2 (two) hours. Administer 1 drop, every 2 hours, while awake, for 2 days. Then 1 drop, every 4 hours, while awake,  for the next 5 days. Patient not taking: Reported on 01/21/2021 01/05/21   Sandi Mealy E., PA-C  letrozole [facility]) 2.5 MG tablet TAKE 1 TABLET ONCE DAILY. 07/10/21   Nicholas Lose, MD  lidocaine-prilocaine (EMLA) cream Apply to affected area once 11/03/20   Nicholas Lose, MD  lisinopril (ZESTRIL) 10 MG tablet Take 1 tablet (10 mg total) by mouth daily. 11/10/20   Nicholas Lose, MD  ondansetron (ZOFRAN) 8 MG tablet Take 1 tablet (8 mg total) by mouth 2 (two) times daily as needed (Nausea or vomiting). 11/03/20   Nicholas Lose, MD    Allergies    Patient has no known allergies.  Review of Systems   Review of Systems  Constitutional:  Negative for chills and fever.  HENT:  Negative for congestion and sore throat.   Eyes: Negative.   Respiratory:  Positive for cough, shortness of breath and wheezing. Negative for chest tightness.   Cardiovascular:  Negative for chest pain.  Gastrointestinal:  Negative for abdominal pain, nausea and vomiting.  Genitourinary: Negative.   Musculoskeletal:  Negative for arthralgias, joint swelling and neck pain.  Skin: Negative.  Negative for rash and wound.  Neurological:  Negative for dizziness, weakness, light-headedness, numbness and headaches.  Psychiatric/Behavioral: Negative.     Physical Exam Updated Vital Signs BP 133/86 (BP Location: Left Arm)   Pulse (!) 101   Temp 98.3 F (36.8 C) (Oral)   Resp 19   Ht '5\' 3"'$  (1.6 m)   Wt 81.6 kg   SpO2 96%   BMI 31.89 kg/m   Physical Exam Vitals and nursing note reviewed.  Constitutional:      Appearance: She is well-developed.  HENT:     Head: Normocephalic and atraumatic.  Eyes:     Conjunctiva/sclera: Conjunctivae normal.  Cardiovascular:     Rate and Rhythm: Normal rate and regular rhythm.     Heart sounds: Normal heart sounds.  Pulmonary:     Effort: Pulmonary effort is normal.     Breath sounds: Wheezing present.     Comments: Mild expiratory wheeze right midlung field.  Prolonged  expirations. Scattered sparse crackles which clears with cough.  Abdominal:     General: Bowel sounds are normal.     Palpations: Abdomen is soft.     Tenderness: There is no abdominal tenderness.  Musculoskeletal:        General: Normal range of motion.     Cervical back: Normal range of motion.  Skin:    General: Skin is warm and dry.  Neurological:     Mental Status: She is alert.    ED Results / Procedures / Treatments   Labs (all labs ordered are listed, but only abnormal results are displayed) Labs Reviewed - No data to display  EKG None  Radiology DG Chest Port 1 View  Result Date: 07/14/2021 CLINICAL DATA:  Cough, shortness of breath EXAM: PORTABLE CHEST 1 VIEW COMPARISON:  10/16/2020 FINDINGS: Right Port-A-Cath remains in place, unchanged. Heart and mediastinal contours are within normal limits. No focal opacities or effusions. No acute bony abnormality. IMPRESSION: No active disease. Electronically Signed   By: Rolm Baptise M.D.   On: 07/14/2021 19:48    Procedures Procedures   Medications Ordered in ED Medications  albuterol (VENTOLIN HFA) 108 (90 Base) MCG/ACT inhaler 3 puff (3 puffs Inhalation Given 07/14/21 1959)  dexamethasone (DECADRON) injection 10 mg (10 mg Intramuscular Given 07/14/21 2000)  Spacer/Aero-Holding Chambers DEVI 1 each (1 each Other Given 07/14/21 1959)    ED Course  I have reviewed the triage vital signs and the nursing notes.  Pertinent labs & imaging results that were available during my care of the patient were reviewed by me and considered in my medical decision making (see chart for details).    MDM Rules/Calculators/A&P                           Patient with minimal wheezing on exam and normal pulse ox at 96% on room air.  Few scattered crackles which clears with cough.  She has no rales.  She was given an albuterol MDI and an IM dose of Decadron.  I suspect she has an asthma flare versus a mild pneumonitis given her recent chemical  inhalation exposures.  She will continue to use the albuterol MDI, she was put on a Decadron pulse dosing.  No indication for antibiotics, no pneumonia seen on her chest x-ray..  Follow-up anticipated with her PCP as needed, return instructions were also discussed.  COVID 19 testing was offered multiple times, patient deferred. Final Clinical Impression(s) / ED Diagnoses Final diagnoses:  Pneumonitis due to fumes [facility])    Rx / DC Orders ED Discharge Orders          Ordered    dexamethasone (DECADRON) 6 MG tablet  Daily        07/14/21 2021             Evalee Jefferson, PA-C 07/14/21 2023    Davonna Belling, MD 07/15/21 [phone]

## 2021-07-14 NOTE — ED Triage Notes (Signed)
Pt. States they are congested and they have a cough. Pt. States a friend was cleaning with bleach on Friday. Pt. States they had a mask on but they state they feel this was triggered from that.

## 2021-07-15 MED FILL — Spacer/Aerosol-Holding Chambers - Device: Qty: 1 | Status: AC

## 2021-07-20 ENCOUNTER — Telehealth: Payer: Self-pay | Admitting: Hematology and Oncology

## 2021-07-20 NOTE — Telephone Encounter (Signed)
R/s appt time on 8/22 per RN Melanie. Called pt, no answer. Left msg with updated appt time.

## 2021-07-27 ENCOUNTER — Inpatient Hospital Stay: Payer: Medicaid Other

## 2021-07-27 ENCOUNTER — Other Ambulatory Visit: Payer: Self-pay

## 2021-07-27 VITALS — BP 117/78 | HR 72 | Temp 98.0°F | Resp 18 | Wt 196.5 lb

## 2021-07-27 DIAGNOSIS — C50411 Malignant neoplasm of upper-outer quadrant of right female breast: Secondary | ICD-10-CM

## 2021-07-27 DIAGNOSIS — Z5112 Encounter for antineoplastic immunotherapy: Secondary | ICD-10-CM | POA: Diagnosis not present

## 2021-07-27 MED ORDER — ACETAMINOPHEN 325 MG PO TABS
650.0000 mg | ORAL_TABLET | Freq: Once | ORAL | Status: AC
  Administered 2021-07-27: 650 mg via ORAL
  Filled 2021-07-27: qty 2

## 2021-07-27 MED ORDER — SODIUM CHLORIDE 0.9 % IV SOLN: Freq: Once | INTRAVENOUS | Status: AC

## 2021-07-27 MED ORDER — DIPHENHYDRAMINE HCL 25 MG PO CAPS
50.0000 mg | ORAL_CAPSULE | Freq: Once | ORAL | Status: AC
  Administered 2021-07-27: 50 mg via ORAL
  Filled 2021-07-27: qty 2

## 2021-07-27 MED ORDER — TRASTUZUMAB-DKST CHEMO 150 MG IV SOLR
6.0000 mg/kg | Freq: Once | INTRAVENOUS | Status: AC
  Administered 2021-07-27: 504 mg via INTRAVENOUS
  Filled 2021-07-27: qty 24

## 2021-07-27 MED ORDER — HEPARIN SOD (PORK) LOCK FLUSH 100 UNIT/ML IV SOLN
500.0000 [IU] | Freq: Once | INTRAVENOUS | Status: AC | PRN
  Administered 2021-07-27: 500 [IU]

## 2021-07-27 MED ORDER — ONDANSETRON HCL 4 MG/2ML IJ SOLN
8.0000 mg | Freq: Once | INTRAMUSCULAR | Status: AC
  Administered 2021-07-27: 8 mg via INTRAVENOUS
  Filled 2021-07-27: qty 4

## 2021-07-27 MED ORDER — SODIUM CHLORIDE 0.9% FLUSH
10.0000 mL | INTRAVENOUS | Status: DC | PRN
  Administered 2021-07-27: 10 mL

## 2021-07-27 NOTE — Patient Instructions (Addendum)
[facility]  Discharge Instructions: Thank you for choosing [facility] to provide your oncology and hematology care.   If you have a lab appointment with the Cancer Center, please go directly to the Cancer Center and check in at the registration area.   Wear comfortable clothing and clothing appropriate for easy access to any Portacath or PICC line.   We strive to give you quality time with your provider. You may need to reschedule your appointment if you arrive late (15 or more minutes).  Arriving late affects you and other patients whose appointments are after yours.  Also, if you miss three or more appointments without notifying the office, you may be dismissed from the clinic at the provider's discretion.      For prescription refill requests, have your pharmacy contact our office and allow 72 hours for refills to be completed.    Today you received the following chemotherapy and/or immunotherapy agents: Ogivri      To help prevent nausea and vomiting after your treatment, we encourage you to take your nausea medication as directed.  BELOW ARE SYMPTOMS THAT SHOULD BE REPORTED IMMEDIATELY: *FEVER GREATER THAN 100.4 F (38 C) OR HIGHER *CHILLS OR SWEATING *NAUSEA AND VOMITING THAT IS NOT CONTROLLED WITH YOUR NAUSEA MEDICATION *UNUSUAL SHORTNESS OF BREATH *UNUSUAL BRUISING OR BLEEDING *URINARY PROBLEMS (pain or burning when urinating, or frequent urination) *BOWEL PROBLEMS (unusual diarrhea, constipation, pain near the anus) TENDERNESS IN MOUTH AND THROAT WITH OR WITHOUT PRESENCE OF ULCERS (sore throat, sores in mouth, or a toothache) UNUSUAL RASH, SWELLING OR PAIN  UNUSUAL VAGINAL DISCHARGE OR ITCHING   Items with * indicate a potential emergency and should be followed up as soon as possible or go to the Emergency Department if any problems should occur.  Please show the CHEMOTHERAPY ALERT CARD or IMMUNOTHERAPY ALERT CARD at check-in to the  Emergency Department and triage nurse.  Should you have questions after your visit or need to cancel or reschedule your appointment, please contact [facility]  Dept: [phone]  and follow the prompts.  Office hours are 8:00 a.m. to 4:30 p.m. Monday - Friday. Please note that voicemails left after 4:00 p.m. may not be returned until the following business day.  We are closed weekends and major holidays. You have access to a nurse at all times for urgent questions. Please call the main number to the clinic Dept: [phone] and follow the prompts.   For any non-urgent questions, you may also contact your provider using MyChart. We now offer e-Visits for anyone 51 and older to request care online for non-urgent symptoms. For details visit [email or web address].   Also download the MyChart app! Go to the app store, search "MyChart", open the app, select [place], and log in with your MyChart username and password.  Due to Covid, a mask is required upon entering the hospital/clinic. If you do not have a mask, one will be given to you upon arrival. For doctor visits, patients may have 1 support person aged 51 or older with them. For treatment visits, patients cannot have anyone with them due to current Covid guidelines and our immunocompromised population.   

## 2021-07-28 ENCOUNTER — Other Ambulatory Visit: Payer: Self-pay | Admitting: Hematology and Oncology

## 2021-07-28 DIAGNOSIS — C50411 Malignant neoplasm of upper-outer quadrant of right female breast: Secondary | ICD-10-CM

## 2021-07-29 ENCOUNTER — Encounter: Payer: Self-pay | Admitting: Hematology and Oncology

## 2021-08-15 NOTE — Progress Notes (Signed)
Patient Care Team: Raiford Simmonds., PA-C as PCP - General  DIAGNOSIS:    ICD-10-CM   1. Primary malignant neoplasm of upper outer quadrant of female breast, right ([place])  C50.411 MM DIAG BREAST TOMO BILATERAL      SUMMARY OF ONCOLOGIC HISTORY: Oncology History  Primary malignant neoplasm of upper outer quadrant of female breast, right ([place])  08/12/2020 Initial Diagnosis   Screening mammogram on 07/07/20 showed an asymmetry. Diagnostic mammogram and [place] on 07/29/20 showed a 0.8cm mass at the 10 o'clock position and no right axillary adenopathy. Biopsy on 08/12/20 showed invasive and in situ mammary carcinoma, grade 2, HER-2 equivocal by IHC, positive by FISH, ER+ 80%, PR+ 60%, Ki67 15%.   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  Variants of uncertain significance detected in ATM (c.131A>G (p.Asp44Gly) and c.4658A>C (p.Glu1553Ala)) and WRN (c.2825+6G>T (intronic)).  The report date is October 02, 2020.   The Multi-Cancer Panel offered by [facility] includes sequencing and/or deletion duplication testing of the following 85 genes: AIP, ALK, APC, ATM, AXIN2,BAP1,  BARD1, BLM, BMPR1A, BRCA1, BRCA2, BRIP1, CASR, CDC73, CDH1, CDK4, CDKN1B, CDKN1C, CDKN2A (p14ARF), CDKN2A (p16INK4a), CEBPA, CHEK2, CTNNA1, DICER1, DIS3L2, EGFR (c.2369C>T, p.Thr790Met variant only), EPCAM (Deletion/duplication  testing only), FH, FLCN, GATA2, GPC3, GREM1 (Promoter region deletion/duplication testing only), HOXB13 (c.251G>A, p.Gly84Glu), HRAS, KIT, MAX, MEN1, MET, MITF (c.952G>A, p.Glu318Lys variant only), MLH1, MSH2, MSH3, MSH6, MUTYH, NBN, NF1, NF2, NTHL1, PALB2, PDGFRA, PHOX2B, PMS2, POLD1, POLE, POT1, PRKAR1A, PTCH1, PTEN, RAD50, RAD51C, RAD51D, RB1, RECQL4, RET, RNF43, RUNX1, SDHAF2, SDHA (sequence changes only), SDHB, SDHC, SDHD, SMAD4, SMARCA4, SMARCB1, SMARCE1, STK11, SUFU, TERC, TERT, TMEM127, TP53, TSC1, TSC2, VHL, WRN and WT1.   Amended report: The variant of uncertain significance (VUS) in [place] at  c.2825+6G>T (Intronic) has been reclassified to likely benign.  The change in variant classification was made as a result of re-review of evidence in light of new variant interpretation guidelines and/or new information. The amended report date is December 18, 2020.    02/10/2021 -  Radiation Therapy   Adjuvant radiation   04/13/2021 -  Anti-estrogen oral therapy   Initially anastrozole switched to letrozole for fatigue issues     CHIEF COMPLIANT: Herceptin maintenance  INTERVAL HISTORY: Monica [name] is a 50 y.o. with above-mentioned history of right breast cancer underwent a right lumpectomy followed by re-excision, adjuvant chemotherapy, and radiation, currently undergoing Herceptin maintenance therapy. She presents to the clinic today for treatment.  She is tolerating letrozole much better than anastrozole.  She tells thatThe fatigue is much better.  Does not have Severe hot flashes but she generally feels hot. Constipation is under good control with Metamucil  ALLERGIES:  has No Known Allergies.  MEDICATIONS:  Current Outpatient Medications  Medication Sig Dispense Refill   albuterol (VENTOLIN HFA) 108 (90 Base) MCG/ACT inhaler Inhale into the lungs every 6 (six) hours as needed for wheezing or  shortness of breath.     albuterol (VENTOLIN HFA) 108 (90 Base) MCG/ACT inhaler Inhale 2  puffs into the lungs every 4 (four) hours as needed for wheezing or shortness of breath. 8 g 3   Cholecalciferol (VITAMIN D3) 125 MCG (5000 UT) TABS Take 1 tablet by mouth daily.     letrozole (FEMARA) 2.5 MG tablet TAKE 1 TABLET ONCE DAILY. 30 tablet 0   lidocaine-prilocaine (EMLA) cream APPLY TO AFFECTED AREA ONCE. 30 g 0   lisinopril (ZESTRIL) 10 MG tablet Take 1 tablet (10 mg total) by mouth daily.     ondansetron (ZOFRAN) 8 MG tablet Take 1 tablet (8 mg total) by mouth 2 (two) times daily as needed (Nausea or vomiting). 30 tablet 1   No current facility-administered medications for this visit.    PHYSICAL EXAMINATION: ECOG PERFORMANCE STATUS: 1 - Symptomatic but completely ambulatory  Vitals:   08/17/21 0859  BP: 133/83  Pulse: 69  Resp: 18  Temp: (!) 97.5 F (36.4 C)  SpO2: 100%   Filed Weights   08/17/21 0859  Weight: 194 lb 1 oz (88 kg)     LABORATORY DATA:  I have reviewed the data as listed CMP Latest Ref Rng & Units 07/06/2021 05/25/2021 05/05/2021  Glucose 70 - 99 mg/dL 108(H) 96 103(H)  BUN 6 - 20 mg/dL 10 11 12   Creatinine 0.44 - 1.00 mg/dL 1.05(H) 0.96 0.74  Sodium 135 - 145 mmol/L 139 140 140  Potassium 3.5 - 5.1 mmol/L 3.8 3.8 4.0  Chloride 98 - 111 mmol/L 108 108 107  CO2 22 - 32 mmol/L 25 29 25   Calcium 8.9 - 10.3 mg/dL 9.2 8.9 9.4  Total Protein 6.5 - 8.1 g/dL 6.9 7.3 7.0  Total Bilirubin 0.3 - 1.2 mg/dL 0.6 0.4 0.3  Alkaline Phos 38 - 126 U/L 102 124 105  AST 15 - 41 U/L 32 25 26  ALT 0 - 44 U/L 27 23 17     Lab Results  Component Value Date   WBC 6.0 07/06/2021   HGB 12.4 07/06/2021   HCT 34.3 (L) 07/06/2021   MCV 75.7 (L) 07/06/2021   PLT 225 07/06/2021   NEUTROABS 4.0 07/06/2021    ASSESSMENT & PLAN:  Primary malignant neoplasm of upper outer quadrant of female breast, right ([place]) 09/10/2020:Right lumpectomy ([facility]): invasive lobular carcinoma, 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   ER 80%, PR 60%, Ki-67 15%, HER-2 equivocal by IHC positive for fish 10/11/20: Re-excision: Neg   Recommendation: 1. Adjuvant chemotherapy with Taxol Herceptin followed by Herceptin maintenance for 1 year 2. Adjuvant radiation therapy 02/10/21- 03/24/21 3. Adjuvant antiestrogen therapy with anastrozole (patient is postmenopausal) started 04/13/2021 and anti-HER-2 therapy with neratinib -------------------------------------------------------------------------------------------------------------------------------- Current treatment: Herceptin maintenance q 3 weeks with anastrozole, switched to letrozole 07/06/2021 Herceptin maintenance will continue till November 2022   Letrozole Toxicities:   Mild fatigue but tolerating it very well.   Herceptin toxicities: 04/17/2021: EF 65% Nausea issues: Added Zofran to infusions, resolved nausea Constipation: Takes Metamucil and its helping her.  Return to clinic every 3 weeks for Herceptin   Last treatment will be on 10/19/2021    Orders Placed This Encounter  Procedures   MM DIAG BREAST TOMO BILATERAL    Standing Status:   Future    Standing Expiration Date:   08/17/2022    Order Specific Question:   Reason for Exam (SYMPTOM  OR DIAGNOSIS REQUIRED)    Answer:   Annual  mammograms with H/O breast cancer    Order Specific Question:   Is the patient pregnant?    Answer:   No    Order Specific Question:   Preferred imaging location?    Answer:   [facility]    Order Specific Question:   Release to patient    Answer:   Immediate    The patient has a good understanding of the overall plan. she agrees with it. she will call with any problems that may develop before the next visit here.  Total time spent: 30 mins including face to face time and time spent for planning, charting and coordination of care  Rulon Eisenmenger, MD, MPH 08/17/2021  I, Thana Ates, am acting as scribe for Dr. Nicholas Lose.  I have reviewed the above documentation for  accuracy and completeness, and I agree with the above.

## 2021-08-17 ENCOUNTER — Inpatient Hospital Stay: Payer: Medicaid Other | Attending: Hematology and Oncology

## 2021-08-17 ENCOUNTER — Other Ambulatory Visit: Payer: Self-pay

## 2021-08-17 ENCOUNTER — Inpatient Hospital Stay (HOSPITAL_BASED_OUTPATIENT_CLINIC_OR_DEPARTMENT_OTHER): Payer: Medicaid Other | Admitting: Hematology and Oncology

## 2021-08-17 DIAGNOSIS — Z170 Estrogen receptor positive status [ER+]: Secondary | ICD-10-CM | POA: Diagnosis not present

## 2021-08-17 DIAGNOSIS — Z5112 Encounter for antineoplastic immunotherapy: Secondary | ICD-10-CM | POA: Diagnosis not present

## 2021-08-17 DIAGNOSIS — C50411 Malignant neoplasm of upper-outer quadrant of right female breast: Secondary | ICD-10-CM

## 2021-08-17 MED ORDER — DIPHENHYDRAMINE HCL 25 MG PO CAPS
50.0000 mg | ORAL_CAPSULE | Freq: Once | ORAL | Status: AC
  Administered 2021-08-17: 50 mg via ORAL
  Filled 2021-08-17: qty 2

## 2021-08-17 MED ORDER — ACETAMINOPHEN 325 MG PO TABS
650.0000 mg | ORAL_TABLET | Freq: Once | ORAL | Status: AC
  Administered 2021-08-17: 650 mg via ORAL
  Filled 2021-08-17: qty 2

## 2021-08-17 MED ORDER — SODIUM CHLORIDE 0.9% FLUSH
10.0000 mL | INTRAVENOUS | Status: DC | PRN
Start: 2021-08-17 — End: 2021-08-17
  Administered 2021-08-17: 10 mL

## 2021-08-17 MED ORDER — TRASTUZUMAB-DKST CHEMO 150 MG IV SOLR
6.0000 mg/kg | Freq: Once | INTRAVENOUS | Status: AC
  Administered 2021-08-17: 504 mg via INTRAVENOUS
  Filled 2021-08-17: qty 24

## 2021-08-17 MED ORDER — SODIUM CHLORIDE 0.9 % IV SOLN: Freq: Once | INTRAVENOUS | Status: AC

## 2021-08-17 MED ORDER — HEPARIN SOD (PORK) LOCK FLUSH 100 UNIT/ML IV SOLN
500.0000 [IU] | Freq: Once | INTRAVENOUS | Status: AC | PRN
  Administered 2021-08-17: 500 [IU]

## 2021-08-17 NOTE — Assessment & Plan Note (Signed)
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 Adjuvant radiation therapy3/8/22-4/19/22 3. Adjuvant antiestrogen therapywith anastrozole (patient is postmenopausal)started 04/13/2021 and anti-HER-2 therapy with neratinib -------------------------------------------------------------------------------------------------------------------------------- Current treatment:Herceptin maintenance q 3 weekswith anastrozole, switched to letrozole 8/1/2022Herceptin maintenance will continue till November 2022  LetrozoleToxicities:    Herceptin toxicities: 04/17/2021: EF 65% Nausea issues: Added Zofran to infusions  Return to clinic every 3 weeks for Herceptin every 6 weeks and follow-up with me.

## 2021-08-17 NOTE — Patient Instructions (Signed)
[facility]  Discharge Instructions: Thank you for choosing [facility] to provide your oncology and hematology care.   If you have a lab appointment with the Cancer Center, please go directly to the Cancer Center and check in at the registration area.   Wear comfortable clothing and clothing appropriate for easy access to any Portacath or PICC line.   We strive to give you quality time with your provider. You may need to reschedule your appointment if you arrive late (15 or more minutes).  Arriving late affects you and other patients whose appointments are after yours.  Also, if you miss three or more appointments without notifying the office, you may be dismissed from the clinic at the provider's discretion.      For prescription refill requests, have your pharmacy contact our office and allow 72 hours for refills to be completed.    Today you received the following chemotherapy and/or immunotherapy agents: Ogivri      To help prevent nausea and vomiting after your treatment, we encourage you to take your nausea medication as directed.  BELOW ARE SYMPTOMS THAT SHOULD BE REPORTED IMMEDIATELY: *FEVER GREATER THAN 100.4 F (38 C) OR HIGHER *CHILLS OR SWEATING *NAUSEA AND VOMITING THAT IS NOT CONTROLLED WITH YOUR NAUSEA MEDICATION *UNUSUAL SHORTNESS OF BREATH *UNUSUAL BRUISING OR BLEEDING *URINARY PROBLEMS (pain or burning when urinating, or frequent urination) *BOWEL PROBLEMS (unusual diarrhea, constipation, pain near the anus) TENDERNESS IN MOUTH AND THROAT WITH OR WITHOUT PRESENCE OF ULCERS (sore throat, sores in mouth, or a toothache) UNUSUAL RASH, SWELLING OR PAIN  UNUSUAL VAGINAL DISCHARGE OR ITCHING   Items with * indicate a potential emergency and should be followed up as soon as possible or go to the Emergency Department if any problems should occur.  Please show the CHEMOTHERAPY ALERT CARD or IMMUNOTHERAPY ALERT CARD at check-in to the  Emergency Department and triage nurse.  Should you have questions after your visit or need to cancel or reschedule your appointment, please contact [facility]  Dept: [phone]  and follow the prompts.  Office hours are 8:00 a.m. to 4:30 p.m. Monday - Friday. Please note that voicemails left after 4:00 p.m. may not be returned until the following business day.  We are closed weekends and major holidays. You have access to a nurse at all times for urgent questions. Please call the main number to the clinic Dept: [phone] and follow the prompts.   For any non-urgent questions, you may also contact your provider using MyChart. We now offer e-Visits for anyone 18 and older to request care online for non-urgent symptoms. For details visit [email or web address].   Also download the MyChart app! Go to the app store, search "MyChart", open the app, select [facility], and log in with your MyChart username and password.  Due to Covid, a mask is required upon entering the hospital/clinic. If you do not have a mask, one will be given to you upon arrival. For doctor visits, patients may have 1 support person aged 18 or older with them. For treatment visits, patients cannot have anyone with them due to current Covid guidelines and our immunocompromised population.   

## 2021-08-29 ENCOUNTER — Ambulatory Visit
Admission: RE | Admit: 2021-08-29 | Discharge: 2021-08-29 | Disposition: A | Discharge status: Home / Self Care | Payer: Medicaid Other | Source: Ambulatory Visit | Attending: Hematology and Oncology | Admitting: Hematology and Oncology

## 2021-08-29 DIAGNOSIS — C50411 Malignant neoplasm of upper-outer quadrant of right female breast: Secondary | ICD-10-CM

## 2021-08-29 HISTORY — DX: Personal history of antineoplastic chemotherapy: Z92.21

## 2021-08-29 HISTORY — DX: Personal history of irradiation: Z92.3

## 2021-08-29 HISTORY — DX: Malignant neoplasm of unspecified site of unspecified female breast: C50.919

## 2021-08-29 IMAGING — MG DIGITAL DIAGNOSTIC BILAT W/ TOMO W/ CAD
6 of 9 series · 6 of 25 positions shown · non-contrast
Comparison: Previous exam(s).

CLINICAL DATA: 49-year-old female for annual follow-up. History of
RIGHT breast cancer and radiation in [X1].

EXAM:
DIGITAL DIAGNOSTIC BILATERAL MAMMOGRAM WITH CAD AND TOMO

[R MLO]
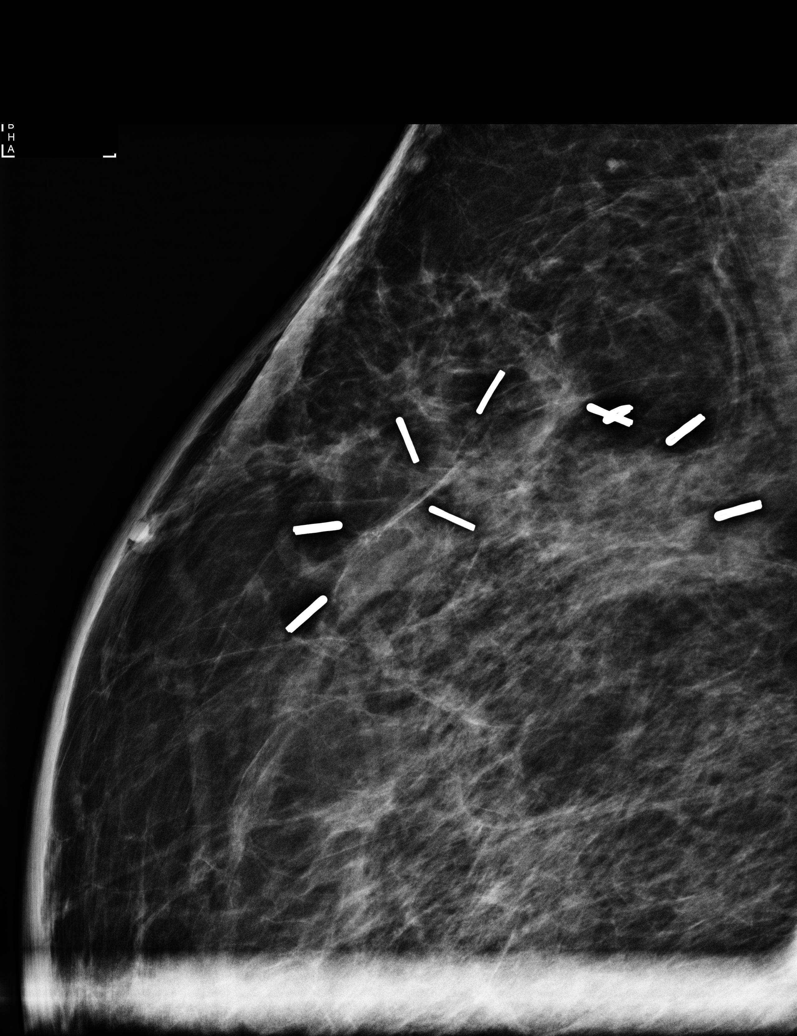

[L CC synth-2D]
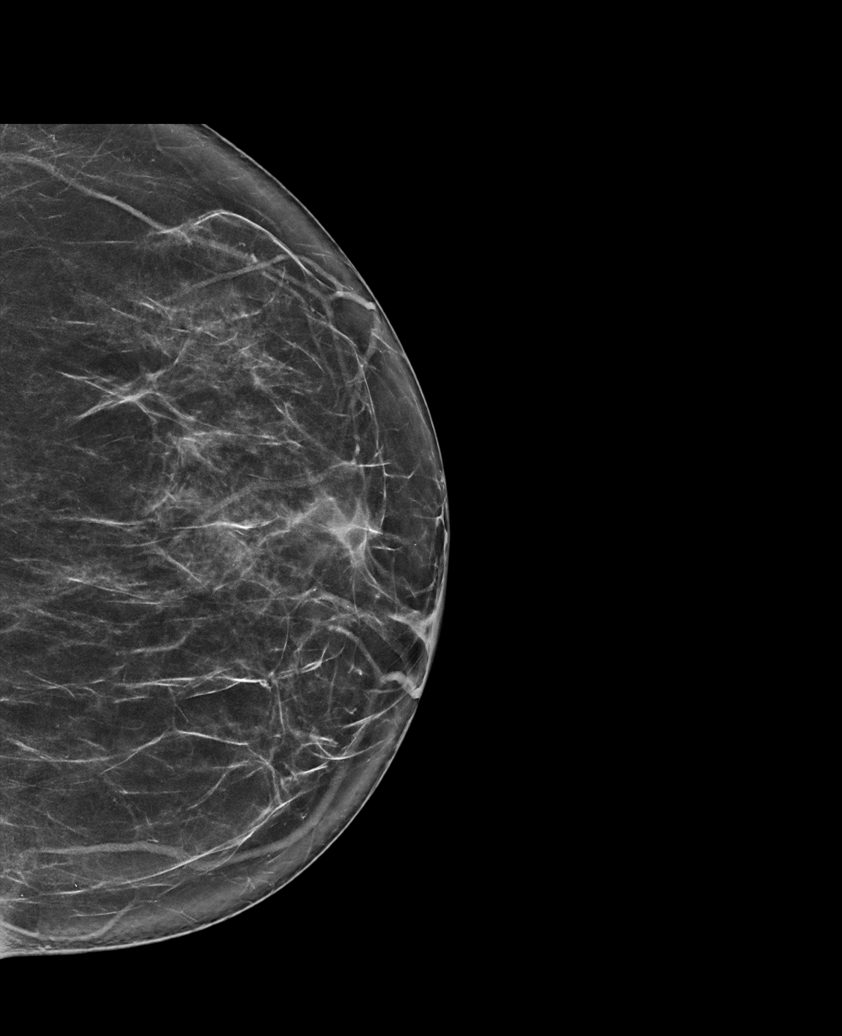

[R MLO synth-2D]
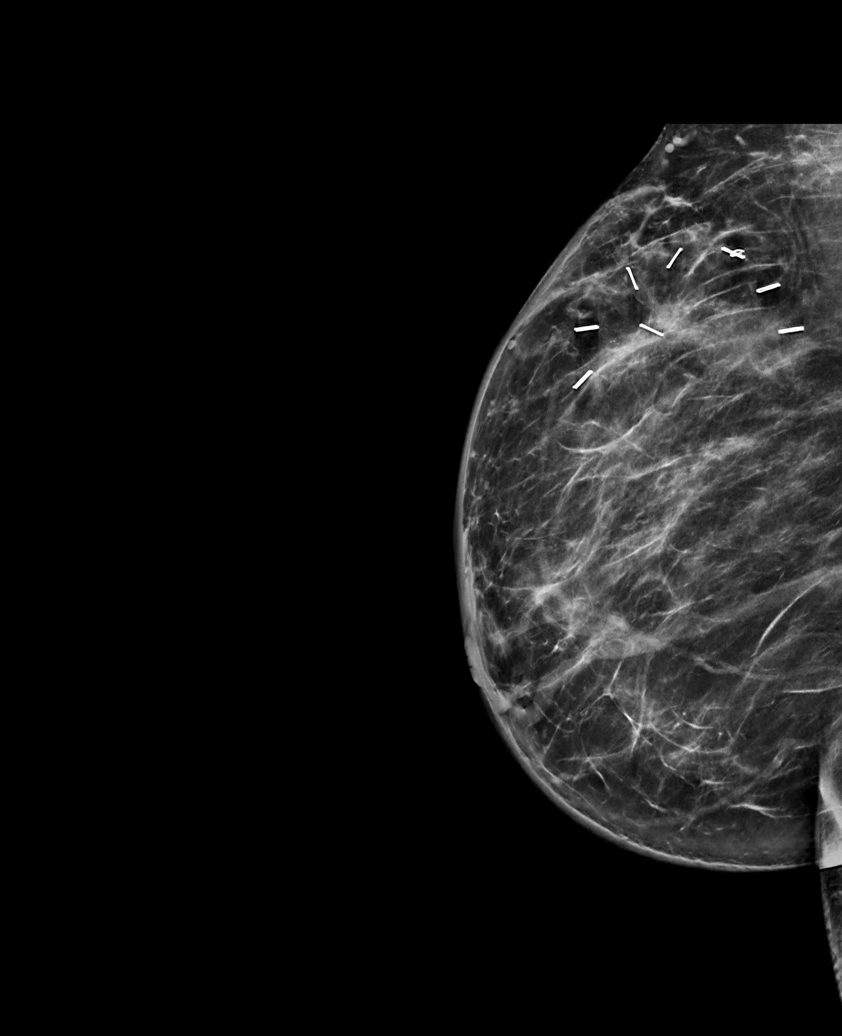

[R CC synth-2D]
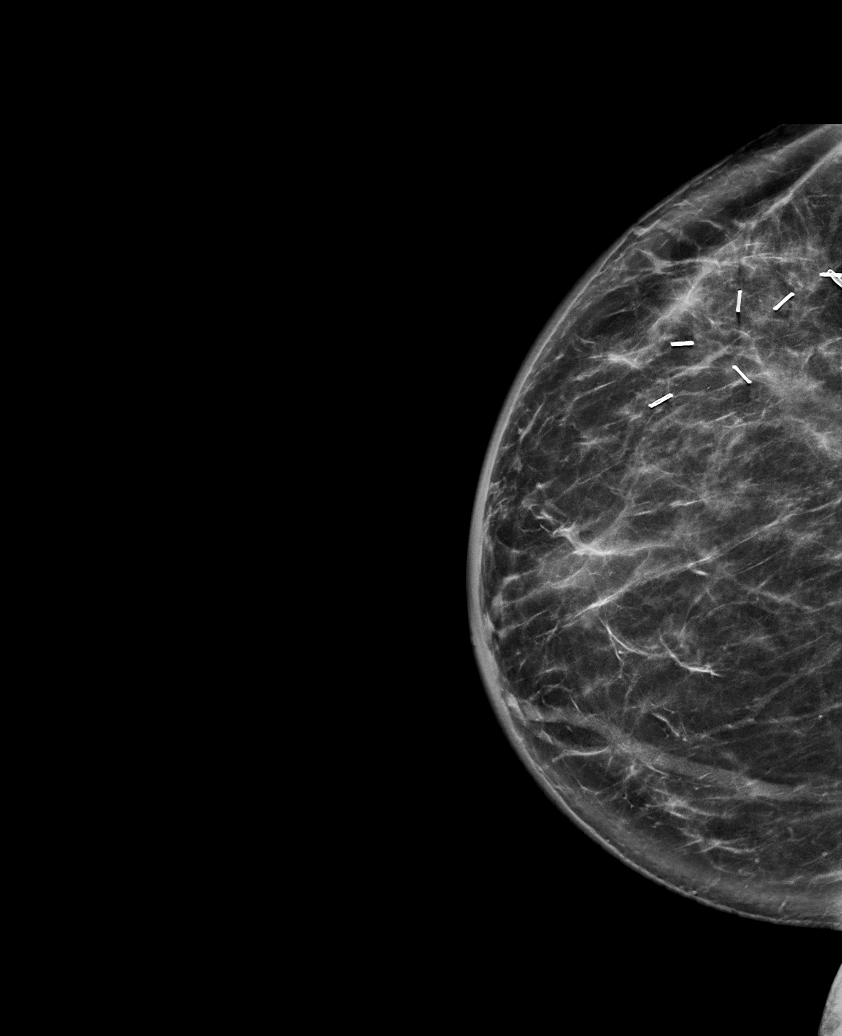

[L MLO synth-2D]
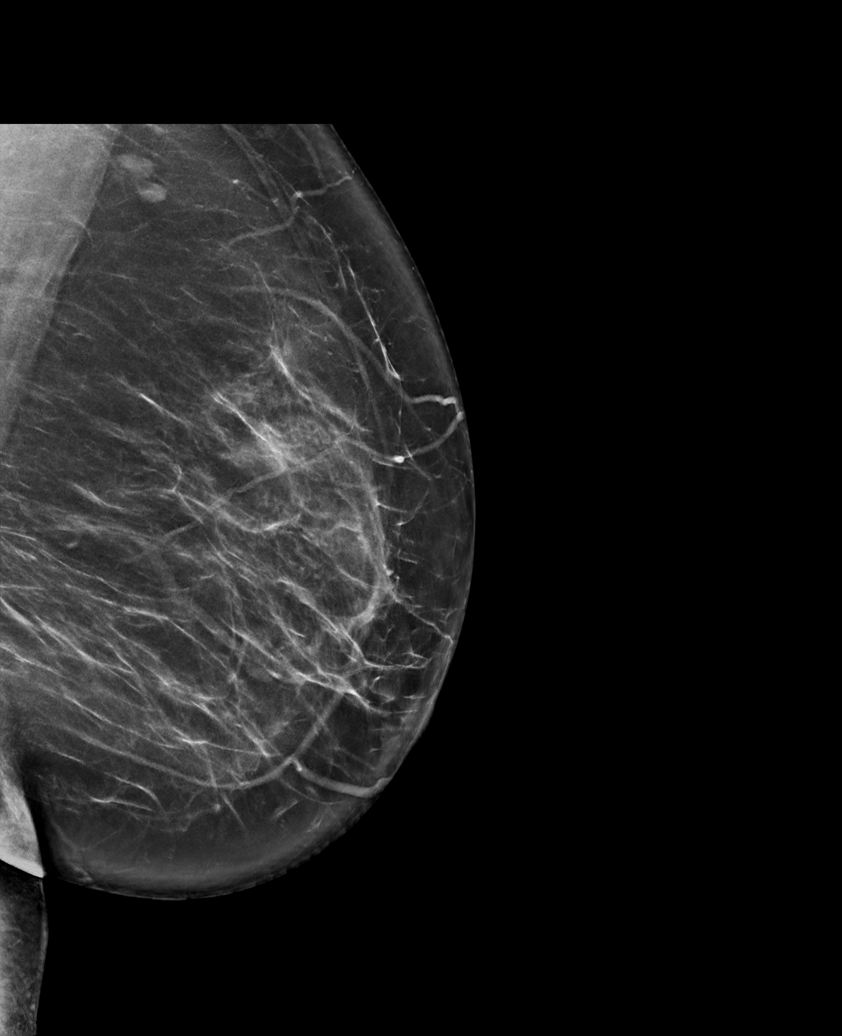

[L MLO tomo · tomo slice 45/88.0]
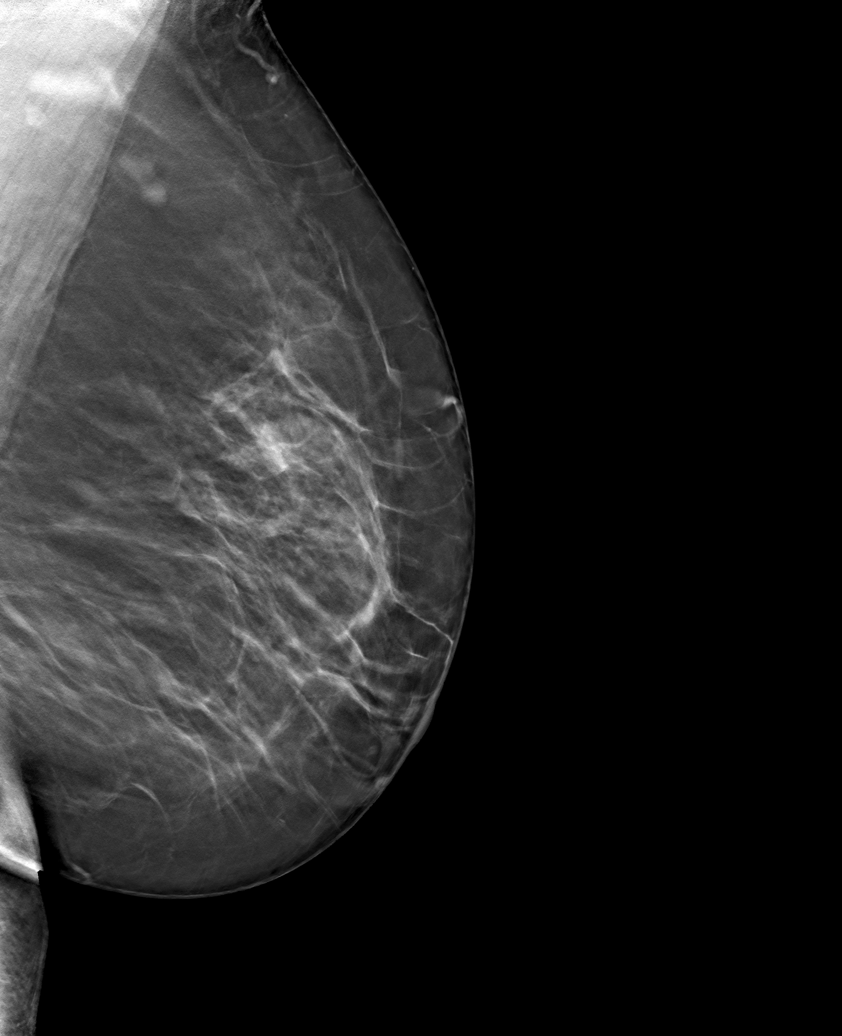

[6 of 25 positions shown; findings below may reference images not displayed]

ACR Breast Density Category b: There are scattered areas of
fibroglandular density.
FINDINGS: 2D and 3D full field views of both breasts and a magnification view
of the lumpectomy site demonstrate no suspicious mass, nonsurgical
distortion or worrisome calcifications.

Lumpectomy changes within the UPPER-OUTER RIGHT breast are again
noted.

Mammographic images were processed with CAD.
IMPRESSION: No evidence of breast malignancy.

RECOMMENDATION:
Bilateral diagnostic mammogram in 1 year.

I have discussed the findings and recommendations with the patient.
If applicable, a reminder letter will be sent to the patient
regarding the next appointment.

BI-RADS CATEGORY  2: Benign.

## 2021-09-01 ENCOUNTER — Other Ambulatory Visit: Payer: Self-pay | Admitting: Hematology and Oncology

## 2021-09-07 ENCOUNTER — Other Ambulatory Visit: Payer: Self-pay

## 2021-09-07 ENCOUNTER — Inpatient Hospital Stay: Payer: Medicaid Other | Attending: Hematology and Oncology

## 2021-09-07 ENCOUNTER — Encounter: Payer: Self-pay | Admitting: *Deleted

## 2021-09-07 ENCOUNTER — Ambulatory Visit: Payer: Medicaid Other

## 2021-09-07 VITALS — BP 136/79 | HR 77 | Temp 98.9°F | Resp 17 | Wt 191.0 lb

## 2021-09-07 DIAGNOSIS — Z5112 Encounter for antineoplastic immunotherapy: Secondary | ICD-10-CM | POA: Insufficient documentation

## 2021-09-07 DIAGNOSIS — Z170 Estrogen receptor positive status [ER+]: Secondary | ICD-10-CM | POA: Diagnosis not present

## 2021-09-07 DIAGNOSIS — C50411 Malignant neoplasm of upper-outer quadrant of right female breast: Secondary | ICD-10-CM | POA: Insufficient documentation

## 2021-09-07 MED ORDER — SODIUM CHLORIDE 0.9% FLUSH
10.0000 mL | INTRAVENOUS | Status: DC | PRN
  Administered 2021-09-07: 10 mL

## 2021-09-07 MED ORDER — DIPHENHYDRAMINE HCL 25 MG PO CAPS
50.0000 mg | ORAL_CAPSULE | Freq: Once | ORAL | Status: AC
  Administered 2021-09-07: 50 mg via ORAL

## 2021-09-07 MED ORDER — TRASTUZUMAB-DKST CHEMO 150 MG IV SOLR
6.0000 mg/kg | Freq: Once | INTRAVENOUS | Status: AC
  Administered 2021-09-07: 504 mg via INTRAVENOUS
  Filled 2021-09-07: qty 24

## 2021-09-07 MED ORDER — ACETAMINOPHEN 325 MG PO TABS
ORAL_TABLET | ORAL | Status: AC
  Filled 2021-09-07: qty 2

## 2021-09-07 MED ORDER — HEPARIN SOD (PORK) LOCK FLUSH 100 UNIT/ML IV SOLN
500.0000 [IU] | Freq: Once | INTRAVENOUS | Status: AC | PRN
  Administered 2021-09-07: 500 [IU]

## 2021-09-07 MED ORDER — ACETAMINOPHEN 325 MG PO TABS
650.0000 mg | ORAL_TABLET | Freq: Once | ORAL | Status: AC
  Administered 2021-09-07: 650 mg via ORAL

## 2021-09-07 MED ORDER — DIPHENHYDRAMINE HCL 25 MG PO CAPS
ORAL_CAPSULE | ORAL | Status: AC
  Filled 2021-09-07: qty 2

## 2021-09-07 MED ORDER — SODIUM CHLORIDE 0.9 % IV SOLN: Freq: Once | INTRAVENOUS | Status: AC

## 2021-09-07 NOTE — Patient Instructions (Signed)
[facility]  Discharge Instructions: Thank you for choosing [facility] to provide your oncology and hematology care.   If you have a lab appointment with the [facility], please go directly to the [facility] and check in at the registration area.   Wear comfortable clothing and clothing appropriate for easy access to any Portacath or PICC line.   We strive to give you quality time with your provider. You may need to reschedule your appointment if you arrive late (15 or more minutes).  Arriving late affects you and other patients whose appointments are after yours.  Also, if you miss three or more appointments without notifying the office, you may be dismissed from the clinic at the provider's discretion.      For prescription refill requests, have your pharmacy contact our office and allow 72 hours for refills to be completed.    Today you received the following chemotherapy and/or immunotherapy agents Trastuzumab-dkst (Ogivri)      To help prevent nausea and vomiting after your treatment, we encourage you to take your nausea medication as directed.  BELOW ARE SYMPTOMS THAT SHOULD BE REPORTED IMMEDIATELY: *FEVER GREATER THAN 100.4 F (38 C) OR HIGHER *CHILLS OR SWEATING *NAUSEA AND VOMITING THAT IS NOT CONTROLLED WITH YOUR NAUSEA MEDICATION *UNUSUAL SHORTNESS OF BREATH *UNUSUAL BRUISING OR BLEEDING *URINARY PROBLEMS (pain or burning when urinating, or frequent urination) *BOWEL PROBLEMS (unusual diarrhea, constipation, pain near the anus) TENDERNESS IN MOUTH AND THROAT WITH OR WITHOUT PRESENCE OF ULCERS (sore throat, sores in mouth, or a toothache) UNUSUAL RASH, SWELLING OR PAIN  UNUSUAL VAGINAL DISCHARGE OR ITCHING   Items with * indicate a potential emergency and should be followed up as soon as possible or go to the Emergency Department if any problems should occur.  Please show the CHEMOTHERAPY ALERT CARD or IMMUNOTHERAPY ALERT CARD  at check-in to the Emergency Department and triage nurse.  Should you have questions after your visit or need to cancel or reschedule your appointment, please contact [facility]  Dept: [phone]  and follow the prompts.  Office hours are 8:00 a.m. to 4:30 p.m. Monday - Friday. Please note that voicemails left after 4:00 p.m. may not be returned until the following business day.  We are closed weekends and major holidays. You have access to a nurse at all times for urgent questions. Please call the main number to the clinic Dept: [phone] and follow the prompts.   For any non-urgent questions, you may also contact your provider using MyChart. We now offer e-Visits for anyone 86 and older to request care online for non-urgent symptoms. For details visit [email or web address].   Also download the MyChart app! Go to the app store, search "MyChart", open the app, select [place], and log in with your MyChart username and password.  Due to Covid, a mask is required upon entering the hospital/clinic. If you do not have a mask, one will be given to you upon arrival. For doctor visits, patients may have 1 support person aged 26 or older with them. For treatment visits, patients cannot have anyone with them due to current Covid guidelines and our immunocompromised population.

## 2021-09-08 ENCOUNTER — Encounter: Payer: Self-pay | Admitting: General Practice

## 2021-09-10 ENCOUNTER — Telehealth: Payer: Self-pay | Admitting: General Practice

## 2021-09-10 NOTE — Telephone Encounter (Addendum)
[facility] SW Progress Notes  Patient dropped off application for [place] on 09/07/21.  CSW called to follow up w patient as some parts of application are incomplete.  Spoke w patient - she has emailed supporting financial documents to [place] - will get these from [place] and submit application.  Patient is already connected wht [place] and is receiving help from them.  She has submitted applications to [employer] and [employer].  She has been given Physicist, medical and has Medicaid.  She was offered [employer] food pantry referral, but declined this. CSW will submit [facility] application when completed paperwork is received.  Edwyna Shell, LCSW Clinical Social Worker Phone:  [phone]

## 2021-09-11 ENCOUNTER — Encounter: Payer: Self-pay | Admitting: *Deleted

## 2021-09-11 NOTE — Progress Notes (Signed)
CSW completed medical provider portion of [employer]. And sent via secure email on patient's behalf. Patient notified.  Maryjean Morn, MSW, LCSW, OSW-C Clinical Social Worker [facility] [phone]

## 2021-09-21 ENCOUNTER — Telehealth: Payer: Self-pay | Admitting: *Deleted

## 2021-09-21 NOTE — Telephone Encounter (Signed)
Received call from pt with complaint of left leg numbness starting after Trastuzumab tx on 09/09/21.  Pt denies redness, swelling, recent injury or sever pain.  Pt states numbness is worse when laying down.  Per MD pt needing to be seen by NP fur further evaluation and tx.  Appt scheduled and pt verbalized understanding of date and time.

## 2021-09-22 ENCOUNTER — Ambulatory Visit (HOSPITAL_COMMUNITY)
Admission: RE | Admit: 2021-09-22 | Discharge: 2021-09-22 | Disposition: A | Discharge status: Home / Self Care | Payer: Medicaid Other | Source: Ambulatory Visit | Attending: Adult Health | Admitting: Adult Health

## 2021-09-22 ENCOUNTER — Inpatient Hospital Stay (HOSPITAL_BASED_OUTPATIENT_CLINIC_OR_DEPARTMENT_OTHER): Payer: Medicaid Other | Admitting: Adult Health

## 2021-09-22 ENCOUNTER — Other Ambulatory Visit: Payer: Self-pay

## 2021-09-22 VITALS — BP 119/70 | HR 78 | Temp 97.7°F | Resp 18 | Ht 63.0 in | Wt 189.4 lb

## 2021-09-22 DIAGNOSIS — M25552 Pain in left hip: Secondary | ICD-10-CM | POA: Diagnosis present

## 2021-09-22 DIAGNOSIS — C50411 Malignant neoplasm of upper-outer quadrant of right female breast: Secondary | ICD-10-CM

## 2021-09-22 IMAGING — DX DG HIP (WITH OR WITHOUT PELVIS) 2-3V*L*
3 series · 3 of 3 positions shown · non-contrast
Comparison: None

CLINICAL DATA: Left hip pain over the last 2 weeks.

EXAM:
DG HIP (WITH OR WITHOUT PELVIS) 2-3V LEFT

[pelvis ap]
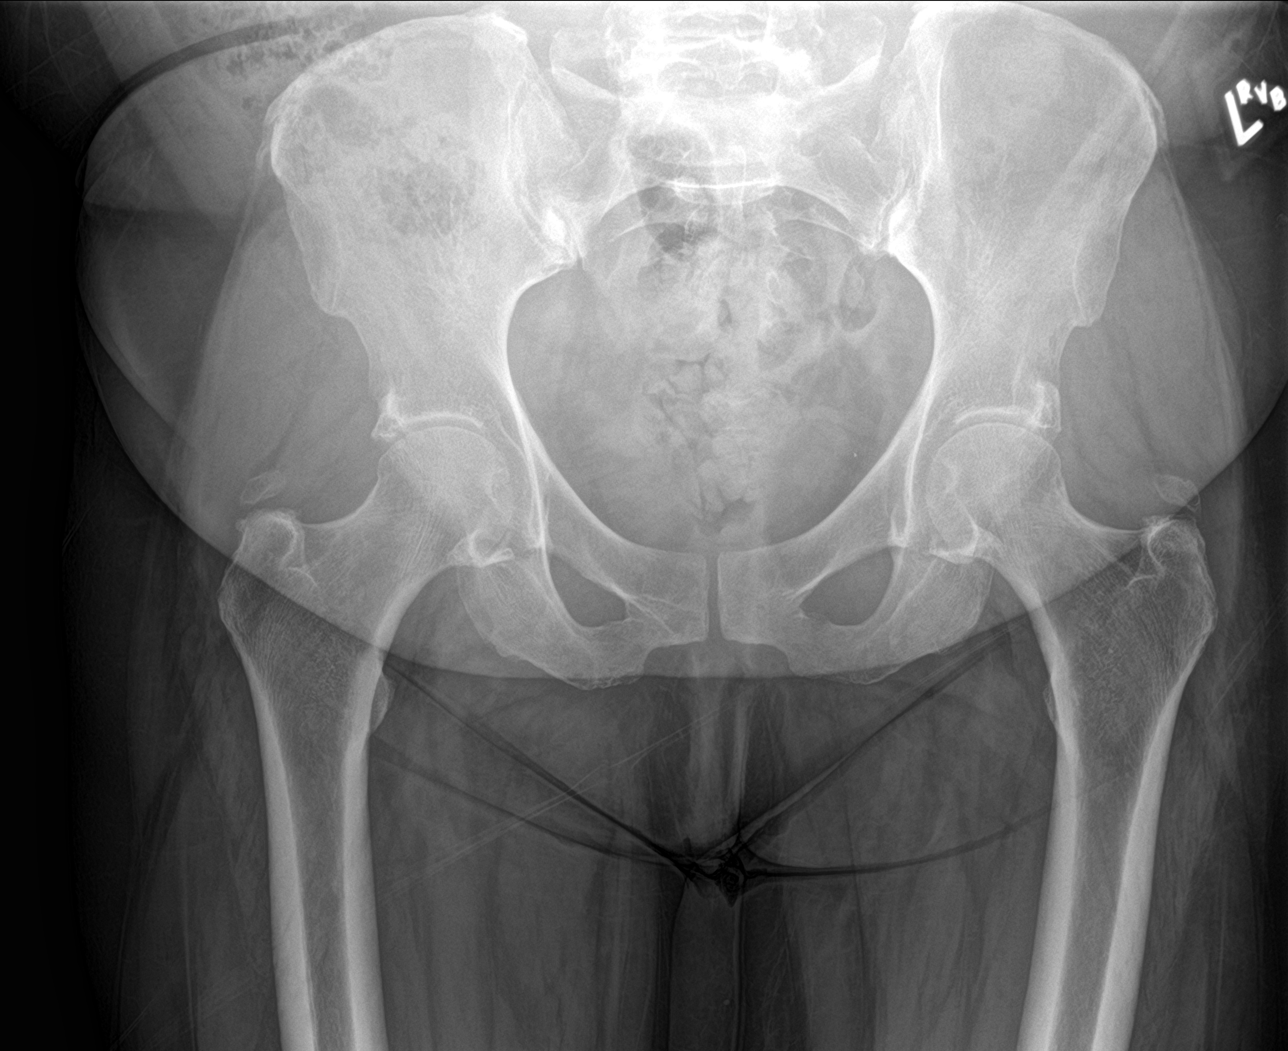

[hip ap]
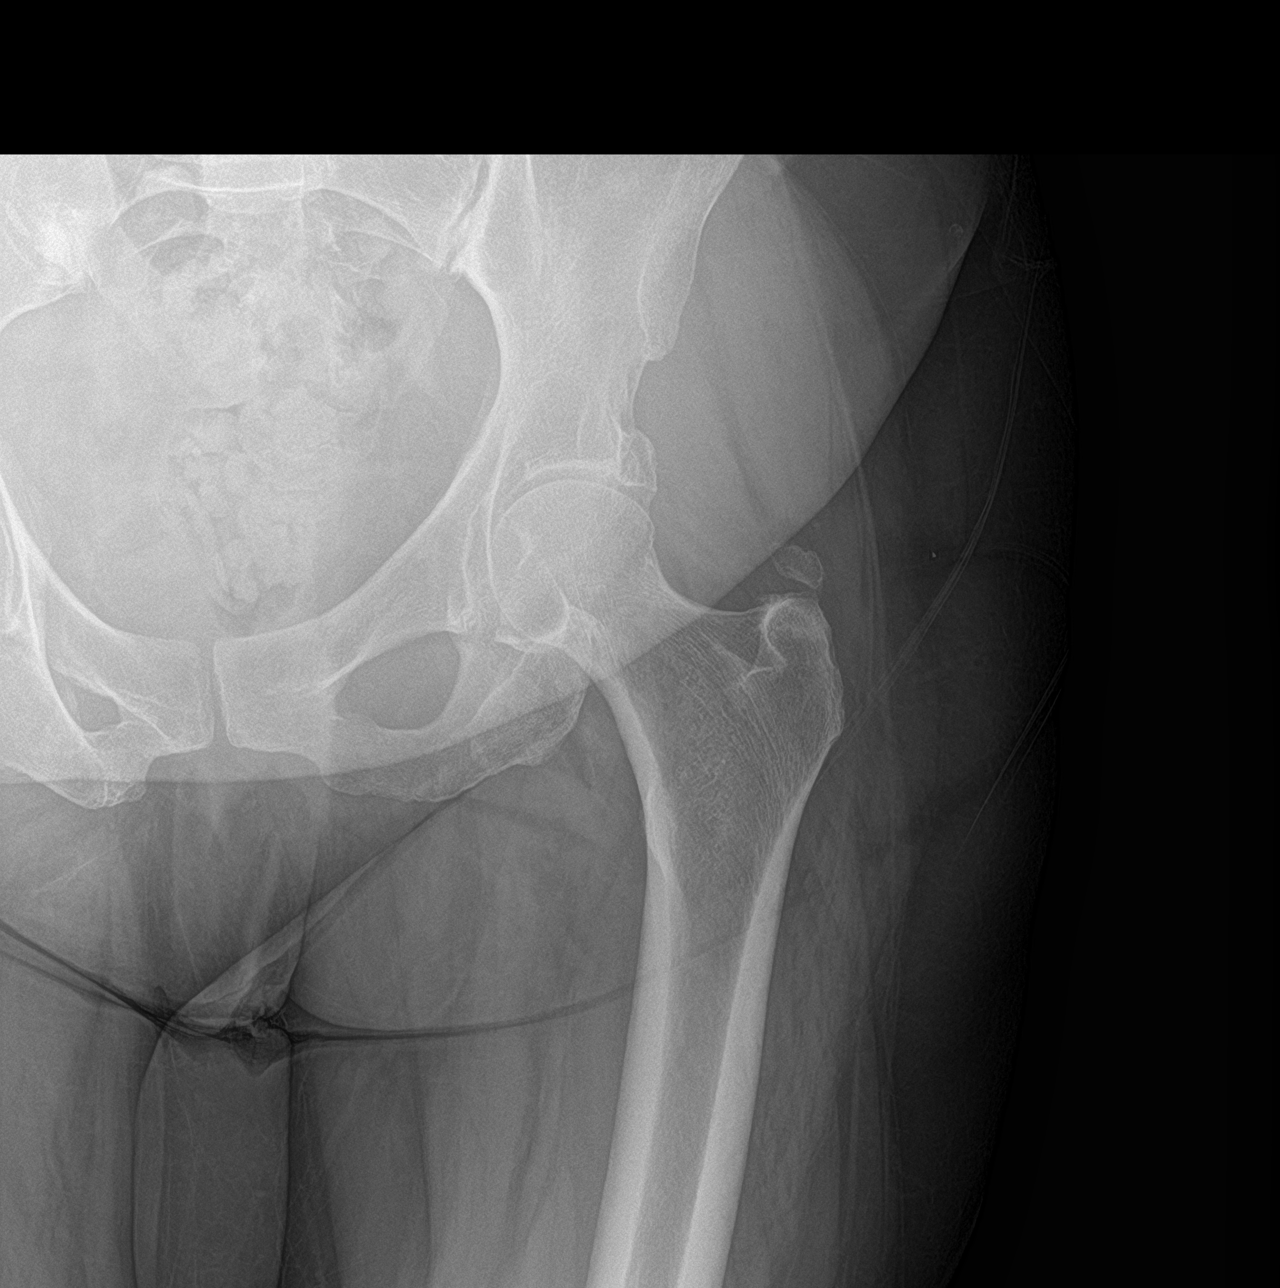

[hip lat]
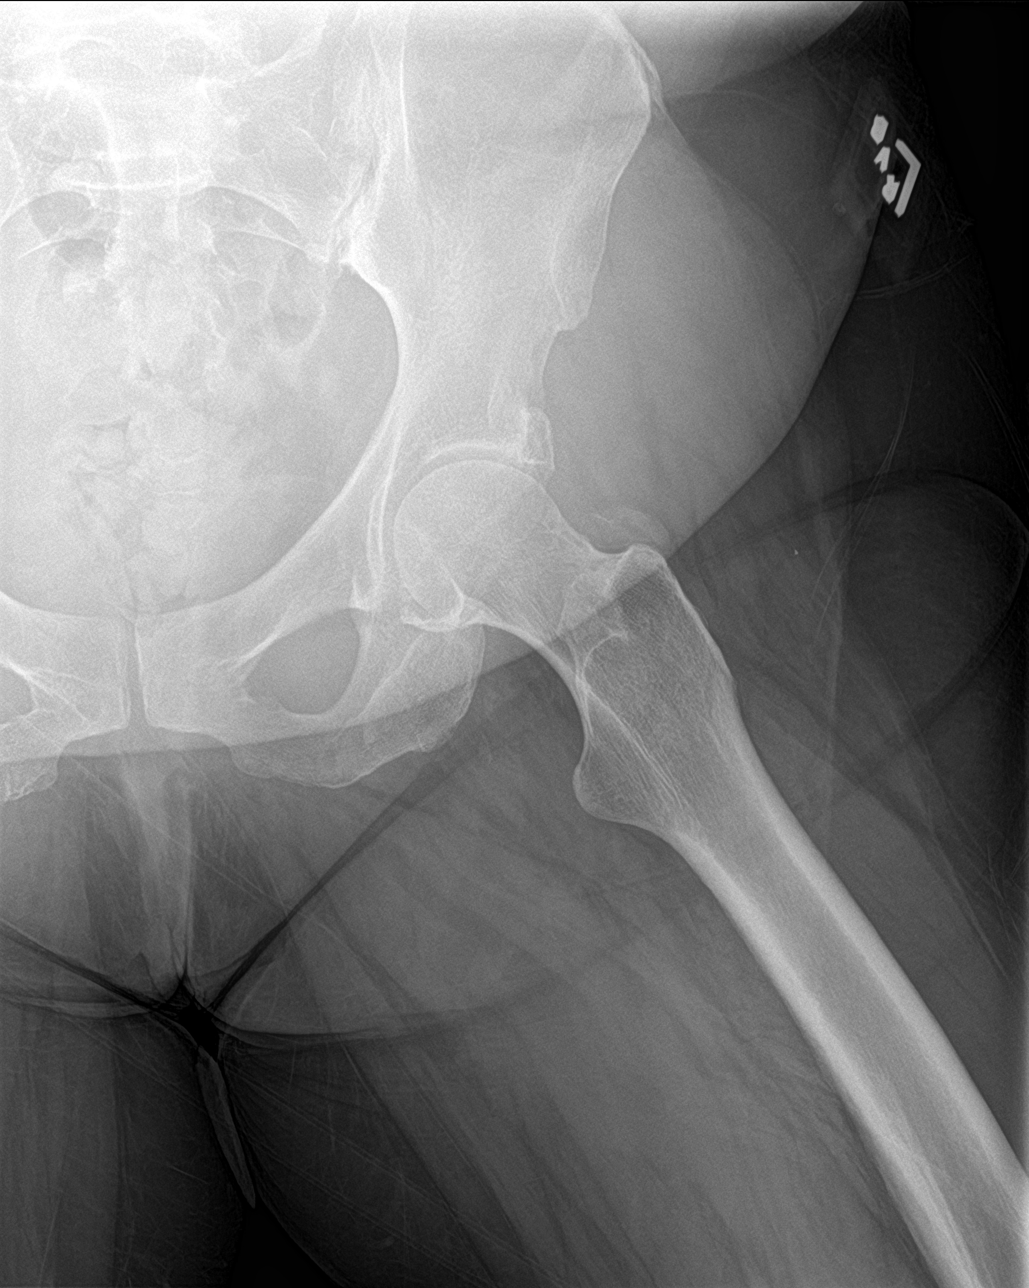

[3 of 3 positions shown; findings below may reference images not displayed]

FINDINGS: No focal bone lesion seen affecting the pelvis or either hip. No
joint space narrowing of either hip. No other focal bone finding.
Chronic calcifications adjacent to both greater trochanters.
Sacroiliac joints and symphysis pubis appear normal.
IMPRESSION: Negative radiographs.  No abnormality seen to explain left hip pain.

## 2021-09-22 MED ORDER — METHYLPREDNISOLONE 4 MG PO TBPK: ORAL_TABLET | ORAL | 0 refills | Status: DC

## 2021-09-22 NOTE — Patient Instructions (Signed)
Hip Bursitis Hip bursitis is swelling of one or more fluid-filled sacs (bursae) in your hip joint. This condition can cause pain, and your symptoms may come and go over time. What are the causes? Repeated use of your hip muscles. Injury to the hip. Weak butt muscles. Bone spurs. Infection. In some cases, the cause may not be known. What increases the risk? You are more likely to develop this condition if: You had a past hip injury or hip surgery. You have a condition, such as arthritis, gout, diabetes, or thyroid disease. You have spine problems. You have one leg that is shorter than the other. You run a lot or do long-distance running. You play sports where there is a risk of injury or falling, such as football, martial arts, or skiing. What are the signs or symptoms? Symptoms may come and go, and they often include: Pain in the hip or groin area. Pain may get worse when you move your hip. Tenderness and swelling of the hip. In rare cases, the bursa may become infected. If this happens, you may get a fever, as well as have warmth and redness in the hip area. How is this treated? This condition is treated by: Resting your hip. Icing your hip. Wrapping the hip area with an elastic bandage (compression wrap). Keeping the hip raised. Other treatments may include medicine, draining fluid out of the bursa, or using crutches, a cane, or a walker. Surgery may be needed, but this is rare. Long-term treatment may include doing exercises to help your strength and flexibility. It may also include lifestyle changes like losing weight to lessen the strain on your hip. Follow these instructions at home: Managing pain, stiffness, and swelling   If told, put ice on the painful area. Put ice in a plastic bag. Place a towel between your skin and the bag. Leave the ice on for 20 minutes, 2-3 times a day. Raise your hip by putting a pillow under your hips while you lie down. Stop if you feel  pain. If told, put heat on the affected area. Do this as often as told by your doctor. Use a moist heat pack or a heating pad as told by your doctor. Place a towel between your skin and the heat source. Leave the heat on for 20-30 minutes. Take off the heat if your skin turns bright red. This is very important if you are unable to feel pain, heat, or cold. You may have a greater risk of getting burned. Activity Do not use your hip to support your body weight until your doctor says that you can. Use crutches, a cane, or a walker as told by your doctor. If the affected leg is one that you use to drive, ask your doctor if it is safe to drive. Rest and protect your hip as much as you can until you feel better. Return to your normal activities as told by your doctor. Ask your doctor what activities are safe for you. Do exercises as told by your doctor. General instructions Take over-the-counter and prescription medicines only as told by your doctor. Gently rub and stretch your injured area as often as is comfortable. Wear elastic bandages only as told by your doctor. If one of your legs is shorter than the other, get fitted for a shoe insert or orthotic. Keep a healthy weight. Follow instructions from your doctor. Keep all follow-up visits as told by your doctor. This is important. How is this prevented? Exercise regularly, as  told by your doctor. Wear the right shoes for the sport you play. Warm up and stretch before being active. Cool down and stretch after being active. Take breaks often from repeated activity. Avoid activities that bother your hip or cause pain. Avoid sitting down for a long time. Where to find more information American Academy [employer]: orthoinfo.aaos.org Contact a doctor if: You have a fever. You have new symptoms. You have trouble walking or doing everyday activities. You have pain that gets worse or does not get better with medicine. Your skin  around your hip is red. You get a feeling of warmth in your hip area. Get help right away if: You cannot move your hip. You have very bad pain. You cannot control the muscles in your feet. Summary Hip bursitis is swelling of one or more fluid-filled sacs (bursae) in your hip joint. Symptoms often come and go over time. This condition is often treated by resting and icing the hip. It also may help to keep the area raised and wrapped in an elastic bandage. Other treatments may be needed. This information is not intended to replace advice given to you by your health care provider. Make sure you discuss any questions you have with your health care provider. Document Revised: 09/24/2019 Document Reviewed: 07/31/2018 Elsevier Patient Education  2022 [employer].

## 2021-09-22 NOTE — Assessment & Plan Note (Signed)
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 Adjuvant radiation therapy3/8/22-4/19/22 3. Adjuvant antiestrogen therapywith anastrozole (patient is postmenopausal)started 04/13/2021 and anti-HER-2 therapy with neratinib -------------------------------------------------------------------------------------------------------------------------------- Current treatment:Herceptin maintenance q 3 weekswith anastrozole, switched to letrozole 8/1/2022Herceptin maintenance will continue till November 2022  LetrozoleToxicities:    Herceptin toxicities: 04/17/2021: EF 65% next echo scheduled in November   Left lateral leg numbness/pain: She has no focal weakness.  I suspect trochanteric bursitis considering her point tenderness on exam and locations.  I have ordered plain films of her hip and prescribed a medrol dosepak.  After she completes the medrol dosepak she was recommended to take aleve bid with food if needed.  If the pain/numbness worsens or doesn't improve I will consider MRI and/or f/u with sports medicine.

## 2021-09-22 NOTE — Progress Notes (Signed)
Monica [name] Follow up:    Monica [name]., PA-C [facility] [address] [place] [address]   DIAGNOSIS: [name] Staging Primary malignant neoplasm of upper outer quadrant of female breast, right [facility]) Staging form: Breast, AJCC 8th Edition - Clinical stage from 08/26/2020: Stage IA (cT1b, cN0, cM0, G2, ER+, PR+, HER2+) - Signed by Eppie Gibson, MD on 08/27/2020 Stage prefix: Initial diagnosis Histologic grading system: 3 grade system   SUMMARY OF ONCOLOGIC HISTORY: Oncology History  Primary malignant neoplasm of upper outer quadrant of female breast, right ([place])  08/12/2020 Initial Diagnosis   Screening mammogram on 07/07/20 showed an asymmetry. Diagnostic mammogram and [place] on 07/29/20 showed a 0.8cm mass at the 10 o'clock position and no right axillary adenopathy. Biopsy on 08/12/20 showed invasive and in situ mammary carcinoma, grade 2, HER-2 equivocal by IHC, positive by FISH, ER+ 80%, PR+ 60%, Ki67 15%.   08/26/2020 [name] Staging   Staging form: Breast, AJCC 8th Edition - Clinical stage from 08/26/2020: Stage IA (cT1b, cN0, cM0, G2, ER+, PR+, HER2+) - Signed by Eppie Gibson, MD on 08/27/2020   09/10/2020 Surgery   Right lumpectomy ([facility]): invasive lobular carcinoma, 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  Variants of uncertain significance detected in ATM (c.131A>G (p.Asp44Gly) and c.4658A>C (p.Glu1553Ala)) and WRN (c.2825+6G>T (intronic)).  The report date is October 02, 2020.   The Multi-[name] Panel offered by [facility] includes sequencing and/or deletion duplication testing of the following 85 genes: AIP, ALK, APC,  ATM, AXIN2,BAP1,  BARD1, BLM, BMPR1A, BRCA1, BRCA2, BRIP1, CASR, CDC73, CDH1, CDK4, CDKN1B, CDKN1C, CDKN2A (p14ARF), CDKN2A (p16INK4a), CEBPA, CHEK2, CTNNA1, DICER1, DIS3L2, EGFR (c.2369C>T, p.Thr790Met variant only), EPCAM (Deletion/duplication testing only), FH, FLCN, GATA2, GPC3, GREM1 (Promoter region deletion/duplication testing only), HOXB13 (c.251G>A, p.Gly84Glu), HRAS, KIT, MAX, MEN1, MET, MITF (c.952G>A, p.Glu318Lys variant only), MLH1, MSH2, MSH3, MSH6, MUTYH, NBN, NF1, NF2, NTHL1, PALB2, PDGFRA, PHOX2B, PMS2, POLD1, POLE, POT1, PRKAR1A, PTCH1, PTEN, RAD50, RAD51C, RAD51D, RB1, RECQL4, RET, RNF43, RUNX1, SDHAF2, SDHA (sequence changes only), SDHB, SDHC, SDHD, SMAD4, SMARCA4, SMARCB1, SMARCE1, STK11, SUFU, TERC, TERT, TMEM127, TP53, TSC1, TSC2, VHL, WRN and WT1.   Amended report: The variant of uncertain significance (VUS) in [place] at  c.2825+6G>T (Intronic) has been reclassified to likely benign.  The change in variant classification was made as a result of re-review of evidence in light of new variant interpretation guidelines and/or new information. The amended report date is December 18, 2020.    02/10/2021 -  Radiation Therapy   Adjuvant radiation   04/13/2021 -  Anti-estrogen oral therapy   Initially anastrozole switched to letrozole for fatigue issues     CURRENT THERAPY: Herceptin and Letrozole  INTERVAL HISTORY: Monica [name] 50 y.o. female returns for evaluation of left leg numbness.   This has been present since 09/08/2021.  It is located in the left leg and radiates from her left lateral leg down to her foot.  Her weight is down 5 pounds and she says she is walking more to help her hip.  No back pain, no sciatic pain.  She says that it is worse with standing.  She denies focal weakness in the  left leg.     Patient Active Problem List   Diagnosis Date Noted   Port-A-Cath in place 12/29/2020   Genetic testing 10/08/2020   Family history of breast [name] 09/22/2020    Primary malignant neoplasm of upper outer quadrant of female breast, right ([place]) 08/27/2020    has No Known Allergies.  MEDICAL HISTORY: Past Medical History:  Diagnosis Date   Asthma    Breast [name] ([place])    Family history of breast [name] 09/22/2020   Hypertension    Personal history of chemotherapy    Personal history of radiation therapy     SURGICAL HISTORY: Past Surgical History:  Procedure Laterality Date   ABDOMINAL HYSTERECTOMY     APPENDECTOMY     BREAST LUMPECTOMY WITH RADIOACTIVE SEED AND SENTINEL LYMPH NODE BIOPSY Right 09/10/2020   Procedure: RIGHT BREAST LUMPECTOMY WITH RADIOACTIVE SEED AND SENTINEL LYMPH NODE MAPPING;  Surgeon: Erroll Luna, MD;  Location: [place];  Service: General;  Laterality: Right;   PORTACATH PLACEMENT Right 10/16/2020   Procedure: INSERTION PORT-A-CATH WITH ULTRASOUND GUIDANCE;  Surgeon: Erroll Luna, MD;  Location: [place];  Service: General;  Laterality: Right;   RE-EXCISION OF BREAST LUMPECTOMY Right 10/16/2020   Procedure: RE-EXCISION OF RIGHT BREAST LUMPECTOMY;  Surgeon: Erroll Luna, MD;  Location: [place];  Service: General;  Laterality: Right;    SOCIAL HISTORY: Social History   Socioeconomic History   Marital status: Single    Spouse name: Not on file   Number of children: Not on file   Years of education: Not on file   Highest education level: Not on file  Occupational History   Not on file  Tobacco Use   Smoking status: Former    Packs/day: 0.50    Types: Cigarettes    Quit date: 08/22/2020    Years since quitting: 1.0   Smokeless tobacco: Never  Vaping Use   Vaping Use: Never used  Substance and Sexual Activity   Alcohol use: No   Drug use: Never   Sexual activity: Not Currently    Birth control/protection: Surgical  Other Topics Concern   Not on file  Social History Narrative   Not on file   Social Determinants of Health   Financial Resource  Strain: Not on file  Food Insecurity: Not on file  Transportation Needs: Not on file  Physical Activity: Not on file  Stress: Not on file  Social Connections: Not on file  Intimate Partner Violence: Not on file    FAMILY HISTORY: Family History  Problem Relation Age of Onset   Hypertension Father    Hypertension Sister    Breast [name] Maternal Aunt 66   Breast [name] Paternal Aunt        dx at unknown age   Hypertension Maternal Grandmother    Breast [name] Other        MGM's niece; dx 69s   Breast [name] Other        MGF's niece    Review of Systems  Constitutional:  Negative for appetite change, chills, fatigue, fever and unexpected weight change.  HENT:   Negative for hearing loss, lump/mass and trouble swallowing.   Eyes:  Negative for eye problems and icterus.  Respiratory:  Negative for chest tightness, cough and shortness of breath.   Cardiovascular:  Negative for chest pain, leg swelling and palpitations.  Gastrointestinal:  Negative for abdominal distention, abdominal pain, constipation, diarrhea, nausea and vomiting.  Endocrine: Negative for hot  flashes.  Genitourinary:  Negative for difficulty urinating.   Musculoskeletal:  Negative for arthralgias, back pain and gait problem.  Skin:  Negative for itching and rash.  Neurological:  Positive for numbness (left lateral leg, intermittent, aggravated by standing). Negative for dizziness, extremity weakness, gait problem and headaches.  Hematological:  Negative for adenopathy. Does not bruise/bleed easily.  Psychiatric/Behavioral:  Negative for depression. The patient is not nervous/anxious.      PHYSICAL EXAMINATION  ECOG PERFORMANCE STATUS: 1 - Symptomatic but completely ambulatory  Vitals:   09/22/21 1112  BP: 119/70  Pulse: 78  Resp: 18  Temp: 97.7 F (36.5 C)  SpO2: 100%    Physical Exam Constitutional:      General: She is not in acute distress.    Appearance: Normal appearance. She is not  toxic-appearing.  HENT:     Head: Normocephalic and atraumatic.  Eyes:     General: No scleral icterus. Cardiovascular:     Rate and Rhythm: Normal rate and regular rhythm.     Pulses: Normal pulses.     Heart sounds: Normal heart sounds.  Pulmonary:     Effort: Pulmonary effort is normal.     Breath sounds: Normal breath sounds.  Abdominal:     General: Abdomen is flat. Bowel sounds are normal. There is no distension.     Palpations: Abdomen is soft.     Tenderness: There is no abdominal tenderness.  Musculoskeletal:        General: No swelling.     Cervical back: Neck supple.     Comments: No spinal tenderness to palpation, no swelling in either leg noted, no piriformis tenderness, +TTP to left greater trochanter.    Lymphadenopathy:     Cervical: No cervical adenopathy.  Skin:    General: Skin is warm and dry.     Findings: No rash.  Neurological:     General: No focal deficit present.     Mental Status: She is alert.  Psychiatric:        Mood and Affect: Mood normal.        Behavior: Behavior normal.    LABORATORY DATA:  CBC    Component Value Date/Time   WBC 6.0 07/06/2021 1311   WBC 7.7 09/08/2020 1000   RBC 4.53 07/06/2021 1311   HGB 12.4 07/06/2021 1311   HCT 34.3 (L) 07/06/2021 1311   PLT 225 07/06/2021 1311   MCV 75.7 (L) 07/06/2021 1311   MCH 27.4 07/06/2021 1311   MCHC 36.2 (H) 07/06/2021 1311   RDW 14.5 07/06/2021 1311   LYMPHSABS 1.3 07/06/2021 1311   MONOABS 0.5 07/06/2021 1311   EOSABS 0.2 07/06/2021 1311   BASOSABS 0.0 07/06/2021 1311    CMP     Component Value Date/Time   NA 139 07/06/2021 1311   K 3.8 07/06/2021 1311   CL 108 07/06/2021 1311   CO2 25 07/06/2021 1311   GLUCOSE 108 (H) 07/06/2021 1311   BUN 10 07/06/2021 1311   CREATININE 1.05 (H) 07/06/2021 1311   CALCIUM 9.2 07/06/2021 1311   PROT 6.9 07/06/2021 1311   ALBUMIN 3.5 07/06/2021 1311   AST 32 07/06/2021 1311   ALT 27 07/06/2021 1311   ALKPHOS 102 07/06/2021 1311    BILITOT 0.6 07/06/2021 1311   GFRNONAA >60 07/06/2021 1311   GFRAA >60 09/08/2020 1000          ASSESSMENT and THERAPY PLAN:   Primary malignant neoplasm of upper outer quadrant of female breast, right (  [place]) 09/10/2020:Right lumpectomy ([facility]): invasive lobular carcinoma, 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  ER 80%, PR 60%, Ki-67 15%, HER-2 equivocal by IHC positive for fish 10/11/20: Re-excision: Neg   Recommendation: 1. Adjuvant chemotherapy with Taxol Herceptin followed by Herceptin maintenance for 1 year 2. Adjuvant radiation therapy 02/10/21- 03/24/21 3. Adjuvant antiestrogen therapy with anastrozole (patient is postmenopausal) started 04/13/2021 and anti-HER-2 therapy with neratinib -------------------------------------------------------------------------------------------------------------------------------- Current treatment: Herceptin maintenance q 3 weeks with anastrozole, switched to letrozole 07/06/2021 Herceptin maintenance will continue till November 2022   Letrozole Toxicities:     Herceptin toxicities: 04/17/2021: EF 65% next echo scheduled in November   Left lateral leg numbness/pain: She has no focal weakness.  I suspect trochanteric bursitis considering her point tenderness on exam and locations.  I have ordered plain films of her hip and prescribed a medrol dosepak.  After she completes the medrol dosepak she was recommended to take aleve bid with food if needed.  If the pain/numbness worsens or doesn't improve I will consider MRI and/or f/u with sports medicine.     Orders Placed This Encounter  Procedures   DG HIP UNILAT WITH PELVIS 2-3 VIEWS LEFT    Standing Status:   Future    Number of Occurrences:   1    Standing Expiration Date:   09/22/2022    Order Specific Question:   Reason for Exam (SYMPTOM  OR DIAGNOSIS REQUIRED)    Answer:   left hip pain, h/o breast [name]    Order Specific Question:   Is  patient pregnant?    Answer:   No    Order Specific Question:   Preferred imaging location?    Answer:   [facility]    All questions were answered. The patient knows to call the clinic with any problems, questions or concerns. We can certainly see the patient much sooner if necessary.  Total encounter time: 20 minutes in face to face visit time, chart review, order entry, care coordination and documentation of the encounter.   Wilber Bihari, NP 09/22/21 1:33 PM Medical Oncology and Hematology [facility] [place], [place] [address] Tel. [phone]    Fax. [phone]  *Total Encounter Time as defined by the Centers for Medicare and Medicaid Services includes, in addition to the face-to-face time of a patient visit (documented in the note above) non-face-to-face time: obtaining and reviewing outside history, ordering and reviewing medications, tests or procedures, care coordination (communications with other health care professionals or caregivers) and documentation in the medical record.

## 2021-09-24 ENCOUNTER — Other Ambulatory Visit: Payer: Self-pay | Admitting: Adult Health

## 2021-09-24 DIAGNOSIS — M25559 Pain in unspecified hip: Secondary | ICD-10-CM

## 2021-09-26 NOTE — Progress Notes (Signed)
Patient Care Team: Raiford Simmonds., PA-C as PCP - General  DIAGNOSIS:    ICD-10-CM   1. Primary malignant neoplasm of upper outer quadrant of female breast, right ([place])  C50.411       SUMMARY OF ONCOLOGIC HISTORY: Oncology History  Primary malignant neoplasm of upper outer quadrant of female breast, right ([place])  08/12/2020 Initial Diagnosis   Screening mammogram on 07/07/20 showed an asymmetry. Diagnostic mammogram and [place] on 07/29/20 showed a 0.8cm mass at the 10 o'clock position and no right axillary adenopathy. Biopsy on 08/12/20 showed invasive and in situ mammary carcinoma, grade 2, HER-2 equivocal by IHC, positive by FISH, ER+ 80%, PR+ 60%, Ki67 15%.   08/26/2020 Cancer Staging   Staging form: Breast, AJCC 8th Edition - Clinical stage from 08/26/2020: Stage IA (cT1b, cN0, cM0, G2, ER+, PR+, HER2+) - Signed by Eppie Gibson, MD on 08/27/2020    09/10/2020 Surgery   Right lumpectomy ([facility]): invasive lobular carcinoma, 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  Variants of uncertain significance detected in ATM (c.131A>G (p.Asp44Gly) and c.4658A>C (p.Glu1553Ala)) and WRN (c.2825+6G>T (intronic)).  The report date is October 02, 2020.   The Multi-Cancer Panel offered by [facility] includes sequencing and/or deletion duplication testing of the following 85 genes: AIP, ALK, APC, ATM, AXIN2,BAP1,  BARD1, BLM, BMPR1A, BRCA1, BRCA2, BRIP1, CASR, CDC73, CDH1, CDK4, CDKN1B, CDKN1C, CDKN2A (p14ARF), CDKN2A (p16INK4a), CEBPA, CHEK2, CTNNA1, DICER1, DIS3L2, EGFR (c.2369C>T, p.Thr790Met variant only), EPCAM (Deletion/duplication testing only), FH, FLCN, GATA2,  GPC3, GREM1 (Promoter region deletion/duplication testing only), HOXB13 (c.251G>A, p.Gly84Glu), HRAS, KIT, MAX, MEN1, MET, MITF (c.952G>A, p.Glu318Lys variant only), MLH1, MSH2, MSH3, MSH6, MUTYH, NBN, NF1, NF2, NTHL1, PALB2, PDGFRA, PHOX2B, PMS2, POLD1, POLE, POT1, PRKAR1A, PTCH1, PTEN, RAD50, RAD51C, RAD51D, RB1, RECQL4, RET, RNF43, RUNX1, SDHAF2, SDHA (sequence changes only), SDHB, SDHC, SDHD, SMAD4, SMARCA4, SMARCB1, SMARCE1, STK11, SUFU, TERC, TERT, TMEM127, TP53, TSC1, TSC2, VHL, WRN and WT1.   Amended report: The variant of uncertain significance (VUS) in [place] at  c.2825+6G>T (Intronic) has been reclassified to likely benign.  The change in variant classification was made as a result of re-review of evidence in light of new variant interpretation guidelines and/or new information. The amended report date is December 18, 2020.    02/10/2021 -  Radiation Therapy   Adjuvant radiation   04/13/2021 -  Anti-estrogen oral therapy   Initially anastrozole switched to letrozole for fatigue issues     CHIEF COMPLIANT: Herceptin maintenance  INTERVAL HISTORY: Monica [name] is a 50 y.o. with above-mentioned history of right breast cancer underwent a right lumpectomy followed by re-excision, adjuvant chemotherapy, and radiation, currently undergoing Herceptin maintenance therapy. She presents to the clinic today for treatment.  She continues to suffer from left lateral leg pain.  She was given Medrol Dosepak by Mendel Ryder which did not make any difference.  She had an x-ray of the hip which did not show any bone changes.  She reports that the pain is worse when she lies down and gets better when she stands up.  It is tender to deep palpation.  ALLERGIES:  has No Known Allergies.  MEDICATIONS:  Current Outpatient Medications  Medication Sig Dispense  Refill   albuterol (VENTOLIN HFA) 108 (90 Base) MCG/ACT inhaler Inhale 2 puffs into the lungs every 4 (four) hours as needed for wheezing or shortness of  breath. 8 g 3   Cholecalciferol (VITAMIN D3) 125 MCG (5000 UT) TABS Take 1 tablet by mouth daily.     lidocaine-prilocaine (EMLA) cream APPLY TO AFFECTED AREA ONCE. 30 g 0   lisinopril (ZESTRIL) 10 MG tablet Take 1 tablet (10 mg total) by mouth daily.     ondansetron (ZOFRAN) 8 MG tablet Take 1 tablet (8 mg total) by mouth 2 (two) times daily as needed (Nausea or vomiting). 30 tablet 1   No current facility-administered medications for this visit.    PHYSICAL EXAMINATION: ECOG PERFORMANCE STATUS: 1 - Symptomatic but completely ambulatory  Vitals:   09/28/21 0815  BP: 118/63  Pulse: 64  Resp: 18  Temp: (!) 97.5 F (36.4 C)  SpO2: 100%   Filed Weights   09/28/21 0815  Weight: 192 lb 3.2 oz (87.2 kg)    LABORATORY DATA:  I have reviewed the data as listed CMP Latest Ref Rng & Units 07/06/2021 05/25/2021 05/05/2021  Glucose 70 - 99 mg/dL 108(H) 96 103(H)  BUN 6 - 20 mg/dL 10 11 12   Creatinine 0.44 - 1.00 mg/dL 1.05(H) 0.96 0.74  Sodium 135 - 145 mmol/L 139 140 140  Potassium 3.5 - 5.1 mmol/L 3.8 3.8 4.0  Chloride 98 - 111 mmol/L 108 108 107  CO2 22 - 32 mmol/L 25 29 25   Calcium 8.9 - 10.3 mg/dL 9.2 8.9 9.4  Total Protein 6.5 - 8.1 g/dL 6.9 7.3 7.0  Total Bilirubin 0.3 - 1.2 mg/dL 0.6 0.4 0.3  Alkaline Phos 38 - 126 U/L 102 124 105  AST 15 - 41 U/L 32 25 26  ALT 0 - 44 U/L 27 23 17     Lab Results  Component Value Date   WBC 5.6 09/28/2021   HGB 12.5 09/28/2021   HCT 34.4 (L) 09/28/2021   MCV 77.1 (L) 09/28/2021   PLT 201 09/28/2021   NEUTROABS 3.7 09/28/2021    ASSESSMENT & PLAN:  Primary malignant neoplasm of upper outer quadrant of female breast, right ([place]) 09/10/2020:Right lumpectomy ([facility]): invasive lobular carcinoma, 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  ER 80%, PR 60%, Ki-67 15%, HER-2 equivocal by IHC positive for fish 10/11/20: Re-excision: Neg   Recommendation: 1. Adjuvant chemotherapy with Taxol  Herceptin followed by Herceptin maintenance for 1 year 2. Adjuvant radiation therapy 02/10/21- 03/24/21 3. Adjuvant antiestrogen therapy with anastrozole (patient is postmenopausal) started 04/13/2021 and anti-HER-2 therapy with neratinib -------------------------------------------------------------------------------------------------------------------------------- Current treatment: Herceptin maintenance q 3 weeks with anastrozole, switched to letrozole 07/06/2021 Herceptin maintenance will continue till November 2022   Letrozole Toxicities:   Uncertain if the left lateral leg pain is related to letrozole and therefore I instructed her to stop letrozole as of today.  We will reassess her symptoms in 3 weeks and then make a decision.   Left lateral leg numbness/pain: 09/23/2021: Hip x-ray: Negative, treated with Medrol dose pack without any relief.  I instructed her to stop letrozole.  If the pain does not get better then I might have to refer her to orthopedics or get an MRI.  Herceptin toxicities: 04/17/2021: EF 65% next echo scheduled in November We will send a referral to [facility] for physical therapy.    No orders of the defined types were placed in this encounter.  The patient  has a good understanding of the overall plan. she agrees with it. she will call with any problems that may develop before the next visit here.  Total time spent: 30 mins including face to face time and time spent for planning, charting and coordination of care  Rulon Eisenmenger, MD, MPH 09/28/2021  I, Thana Ates, am acting as scribe for Dr. Nicholas Lose.  I have reviewed the above documentation for accuracy and completeness, and I agree with the above.

## 2021-09-28 ENCOUNTER — Other Ambulatory Visit: Payer: Self-pay

## 2021-09-28 ENCOUNTER — Ambulatory Visit: Payer: Medicaid Other

## 2021-09-28 ENCOUNTER — Inpatient Hospital Stay: Payer: Medicaid Other

## 2021-09-28 ENCOUNTER — Inpatient Hospital Stay (HOSPITAL_BASED_OUTPATIENT_CLINIC_OR_DEPARTMENT_OTHER): Payer: Medicaid Other | Admitting: Hematology and Oncology

## 2021-09-28 ENCOUNTER — Encounter: Payer: Self-pay | Admitting: *Deleted

## 2021-09-28 VITALS — BP 118/63 | HR 64 | Temp 97.5°F | Resp 18 | Ht 63.0 in | Wt 192.2 lb

## 2021-09-28 DIAGNOSIS — C50411 Malignant neoplasm of upper-outer quadrant of right female breast: Secondary | ICD-10-CM

## 2021-09-28 DIAGNOSIS — Z5112 Encounter for antineoplastic immunotherapy: Secondary | ICD-10-CM | POA: Diagnosis not present

## 2021-09-28 DIAGNOSIS — M79605 Pain in left leg: Secondary | ICD-10-CM | POA: Diagnosis not present

## 2021-09-28 DIAGNOSIS — Z95828 Presence of other vascular implants and grafts: Secondary | ICD-10-CM

## 2021-09-28 LAB — CBC WITH DIFFERENTIAL (CANCER CENTER ONLY)
Abs Immature Granulocytes: 0.01 10*3/uL (ref 0.00–0.07)
Basophils Absolute: 0 10*3/uL (ref 0.0–0.1)
Basophils Relative: 1 %
Eosinophils Absolute: 0.1 10*3/uL (ref 0.0–0.5)
Eosinophils Relative: 2 %
HCT: 34.4 % — ABNORMAL LOW (ref 36.0–46.0)
Hemoglobin: 12.5 g/dL (ref 12.0–15.0)
Immature Granulocytes: 0 %
Lymphocytes Relative: 21 %
Lymphs Abs: 1.2 10*3/uL (ref 0.7–4.0)
MCH: 28 pg (ref 26.0–34.0)
MCHC: 36.3 g/dL — ABNORMAL HIGH (ref 30.0–36.0)
MCV: 77.1 fL — ABNORMAL LOW (ref 80.0–100.0)
Monocytes Absolute: 0.5 10*3/uL (ref 0.1–1.0)
Monocytes Relative: 10 %
Neutro Abs: 3.7 10*3/uL (ref 1.7–7.7)
Neutrophils Relative %: 66 %
Platelet Count: 201 10*3/uL (ref 150–400)
RBC: 4.46 MIL/uL (ref 3.87–5.11)
RDW: 13.5 % (ref 11.5–15.5)
WBC Count: 5.6 10*3/uL (ref 4.0–10.5)
nRBC: 0 % (ref 0.0–0.2)

## 2021-09-28 LAB — CMP (CANCER CENTER ONLY)
ALT: 28 U/L (ref 0–44)
AST: 34 U/L (ref 15–41)
Albumin: 3.7 g/dL (ref 3.5–5.0)
Alkaline Phosphatase: 102 U/L (ref 38–126)
Anion gap: 6 (ref 5–15)
BUN: 8 mg/dL (ref 6–20)
CO2: 24 mmol/L (ref 22–32)
Calcium: 8.8 mg/dL — ABNORMAL LOW (ref 8.9–10.3)
Chloride: 107 mmol/L (ref 98–111)
Creatinine: 0.67 mg/dL (ref 0.44–1.00)
GFR, Estimated: >60 mL/min (ref >60)
Glucose, Bld: 94 mg/dL (ref 70–99)
Potassium: 3.8 mmol/L (ref 3.5–5.1)
Sodium: 137 mmol/L (ref 135–145)
Total Bilirubin: 0.4 mg/dL (ref 0.3–1.2)
Total Protein: 7.1 g/dL (ref 6.5–8.1)

## 2021-09-28 MED ORDER — SODIUM CHLORIDE 0.9 % IV SOLN: Freq: Once | INTRAVENOUS | Status: AC

## 2021-09-28 MED ORDER — DIPHENHYDRAMINE HCL 25 MG PO CAPS
50.0000 mg | ORAL_CAPSULE | Freq: Once | ORAL | Status: AC
  Administered 2021-09-28: 50 mg via ORAL
  Filled 2021-09-28: qty 2

## 2021-09-28 MED ORDER — SODIUM CHLORIDE 0.9% FLUSH
10.0000 mL | Freq: Once | INTRAVENOUS | Status: AC
  Administered 2021-09-28: 10 mL

## 2021-09-28 MED ORDER — SODIUM CHLORIDE 0.9 % IV SOLN
6.0000 mg/kg | Freq: Once | INTRAVENOUS | Status: AC
  Administered 2021-09-28: 504 mg via INTRAVENOUS
  Filled 2021-09-28: qty 24

## 2021-09-28 MED ORDER — ACETAMINOPHEN 325 MG PO TABS
650.0000 mg | ORAL_TABLET | Freq: Once | ORAL | Status: AC
  Administered 2021-09-28: 650 mg via ORAL
  Filled 2021-09-28: qty 2

## 2021-09-28 NOTE — Assessment & Plan Note (Signed)
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 Adjuvant radiation therapy3/8/22-4/19/22 3. Adjuvant antiestrogen therapywith anastrozole (patient is postmenopausal)started 04/13/2021 and anti-HER-2 therapy with neratinib -------------------------------------------------------------------------------------------------------------------------------- Current treatment:Herceptin maintenance q 3 weekswith anastrozole, switched to letrozole 8/1/2022Herceptin maintenance will continue till November 2022  LetrozoleToxicities:   Denies any major adverse effects to letrozole therapy.  Herceptin toxicities: 04/17/2021: EF 65% next echo scheduled in November   Left lateral leg numbness/pain: 09/23/2021: Hip x-ray: Negative, treated with Medrol dose pack If the pain is not any better, we recommend getting an MRI or follow-up with sports medicine.

## 2021-09-28 NOTE — Patient Instructions (Signed)
[facility] ONCOLOGY  Discharge Instructions: Thank you for choosing [facility] to provide your oncology and hematology care.   If you have a lab appointment with the [facility], please go directly to the [facility] and check in at the registration area.   Wear comfortable clothing and clothing appropriate for easy access to any Portacath or PICC line.   We strive to give you quality time with your provider. You may need to reschedule your appointment if you arrive late (15 or more minutes).  Arriving late affects you and other patients whose appointments are after yours.  Also, if you miss three or more appointments without notifying the office, you may be dismissed from the clinic at the provider's discretion.      For prescription refill requests, have your pharmacy contact our office and allow 72 hours for refills to be completed.    Today you received the following chemotherapy and/or immunotherapy agents Trastuzumab-dkst ([place])      To help prevent nausea and vomiting after your treatment, we encourage you to take your nausea medication as directed.  BELOW ARE SYMPTOMS THAT SHOULD BE REPORTED IMMEDIATELY: *FEVER GREATER THAN 100.4 F (38 C) OR HIGHER *CHILLS OR SWEATING *NAUSEA AND VOMITING THAT IS NOT CONTROLLED WITH YOUR NAUSEA MEDICATION *UNUSUAL SHORTNESS OF BREATH *UNUSUAL BRUISING OR BLEEDING *URINARY PROBLEMS (pain or burning when urinating, or frequent urination) *BOWEL PROBLEMS (unusual diarrhea, constipation, pain near the anus) TENDERNESS IN MOUTH AND THROAT WITH OR WITHOUT PRESENCE OF ULCERS (sore throat, sores in mouth, or a toothache) UNUSUAL RASH, SWELLING OR PAIN  UNUSUAL VAGINAL DISCHARGE OR ITCHING   Items with * indicate a potential emergency and should be followed up as soon as possible or go to the Emergency Department if any problems should occur.  Please show the CHEMOTHERAPY ALERT CARD or IMMUNOTHERAPY ALERT CARD  at check-in to the Emergency Department and triage nurse.  Should you have questions after your visit or need to cancel or reschedule your appointment, please contact [facility]  Dept: [phone]  and follow the prompts.  Office hours are 8:00 a.m. to 4:30 p.m. Monday - Friday. Please note that voicemails left after 4:00 p.m. may not be returned until the following business day.  We are closed weekends and major holidays. You have access to a nurse at all times for urgent questions. Please call the main number to the clinic Dept: [phone] and follow the prompts.   For any non-urgent questions, you may also contact your provider using MyChart. We now offer e-Visits for anyone 67 and older to request care online for non-urgent symptoms. For details visit [email or web address].   Also download the MyChart app! Go to the app store, search "MyChart", open the app, select [facility], and log in with your MyChart username and password.  Due to Covid, a mask is required upon entering the hospital/clinic. If you do not have a mask, one will be given to you upon arrival. For doctor visits, patients may have 1 support person aged 48 or older with them. For treatment visits, patients cannot have anyone with them due to current Covid guidelines and our immunocompromised population.

## 2021-09-28 NOTE — Progress Notes (Signed)
Per MD request RN placed referral for physical therapy to [facility].

## 2021-10-02 ENCOUNTER — Other Ambulatory Visit: Payer: Self-pay

## 2021-10-02 ENCOUNTER — Ambulatory Visit (HOSPITAL_COMMUNITY): Payer: Medicaid Other | Attending: Hematology and Oncology

## 2021-10-02 ENCOUNTER — Encounter (HOSPITAL_COMMUNITY): Payer: Self-pay

## 2021-10-02 DIAGNOSIS — M6281 Muscle weakness (generalized): Secondary | ICD-10-CM | POA: Diagnosis present

## 2021-10-02 DIAGNOSIS — M79605 Pain in left leg: Secondary | ICD-10-CM | POA: Insufficient documentation

## 2021-10-02 DIAGNOSIS — R262 Difficulty in walking, not elsewhere classified: Secondary | ICD-10-CM | POA: Insufficient documentation

## 2021-10-02 DIAGNOSIS — C50411 Malignant neoplasm of upper-outer quadrant of right female breast: Secondary | ICD-10-CM | POA: Insufficient documentation

## 2021-10-02 DIAGNOSIS — M25552 Pain in left hip: Secondary | ICD-10-CM | POA: Insufficient documentation

## 2021-10-02 NOTE — Therapy (Signed)
[facility], [place], [address] Phone: [phone]   Fax:  [phone]  Physical Therapy Evaluation  Patient Details  Name: Monica [name] MRN: [id number] Date of Birth: 1971/10/09 No data recorded  Encounter Date: 10/02/2021   PT End of Session - 10/02/21 0930     Visit Number 1    Number of Visits 4    Date for PT Re-Evaluation 10/30/21    Authorization Type [place] Medicaid [employer], auth required    PT Start Time 0900    PT Stop Time 0945    PT Time Calculation (min) 45 min    Activity Tolerance Patient limited by pain    Behavior During Therapy [facility] for tasks assessed/performed             Past Medical History:  Diagnosis Date   Asthma    Breast cancer ([place])    Family history of breast cancer 09/22/2020   Hypertension    Personal history of chemotherapy    Personal history of radiation therapy     Past Surgical History:  Procedure Laterality Date   ABDOMINAL HYSTERECTOMY     APPENDECTOMY     BREAST LUMPECTOMY WITH RADIOACTIVE SEED AND SENTINEL LYMPH NODE BIOPSY Right 09/10/2020   Procedure: RIGHT BREAST LUMPECTOMY WITH RADIOACTIVE SEED AND SENTINEL [place];  Surgeon: Erroll Luna, MD;  Location: [facility];  Service: General;  Laterality: Right;   PORTACATH PLACEMENT Right 10/16/2020   Procedure: INSERTION PORT-A-CATH WITH ULTRASOUND GUIDANCE;  Surgeon: Erroll Luna, MD;  Location: [facility];  Service: General;  Laterality: Right;   RE-EXCISION OF BREAST LUMPECTOMY Right 10/16/2020   Procedure: RE-EXCISION OF RIGHT BREAST LUMPECTOMY;  Surgeon: Erroll Luna, MD;  Location: [facility];  Service: General;  Laterality: Right;    There were no vitals filed for this visit.    Subjective Assessment - 10/02/21 0906     Subjective Pt with left hip/LE pain since September 07, 2021 and notes increased pain and numbness with standing.  Notes no relief when sitting, changing position, or sleeping    Pertinent History Breast CA with one more chemo session    Currently in Pain? Yes    Pain Score 9     Pain Location Leg    Pain Orientation Left    Pain Descriptors / Indicators Numbness;Tingling    Pain Type Acute pain    Pain Radiating Towards LLE- lateral hip/lateral knee, lateral foot    Pain Onset 1 to 4 weeks ago    Pain Frequency Constant    Aggravating Factors  walking, standing,    Effect of Pain on Daily Activities limits activities, cannot stand to cook meal                [facility] PT Assessment - 10/02/21 0001       Assessment   Medical Diagnosis LLE pain      Balance Screen   Has the patient fallen in the past 6 months No    Has the patient had a decrease in activity level because of a fear of falling?  No    Is the patient reluctant to leave their home because of a fear of falling?  No      Home Environment   Living Environment Private residence    Type of [place] Access Level entry    Home Layout One level    Home  Equipment None      Prior Function   Level of Independence Independent      Functional Tests   Functional tests Single Leg Squat      Single Leg Squat   Comments RLE: 25 sec; LLE: 8 sec      ROM / Strength   AROM / PROM / Strength Strength      Strength   Overall Strength Comments able to toe walk and heel walk 5 ft, no foot drop noted    Strength Assessment Site Hip    Right/Left Hip Left    Left Hip Flexion 3-/5    Left Hip Extension 3-/5    Left Hip External Rotation 3/5    Left Hip ABduction 3-/5    Left Hip ADduction 3/5      Palpation   Palpation comment left greater trochanter very TTP      Ambulation/Gait   Ambulation/Gait Yes    Ambulation/Gait Assistance 7: Independent    Ambulation Distance (Feet) 175 Feet    Assistive device None    Gait Pattern Antalgic;Lateral trunk lean to left    Ambulation Surface Level;Indoor                         Objective measurements completed on examination: See above findings.                  PT Short Term Goals - 10/02/21 1002       PT SHORT TERM GOAL #1   Title Patient will be independent with HEP in order to improve functional outcomes    Time 2    Period Weeks    Status New    Target Date 10/16/21      PT SHORT TERM GOAL #2   Title Patient will report at least 25% improvement in symptoms for improved quality of life.    Baseline 9/10 left hip    Time 2    Period Weeks    Status New    Target Date 10/16/21      PT SHORT TERM GOAL #3   Title Demo 3+/5 left hip strength to improve activity tolerance    Baseline 3-/5 gross LLE    Time 2    Period Weeks    Status New    Target Date 10/16/21               PT Long Term Goals - 10/02/21 1004       PT LONG TERM GOAL #1   Title Improve ambulation tolerance/velocity per 250 ft 2MWT    Baseline 175 ft with LLE antalgia    Time 4    Period Weeks    Status New    Target Date 10/30/21      PT LONG TERM GOAL #2   Title Demo improve LLE strength and decreased pain as evidenced by 25 sec Sinlge Leg Stance LLE    Baseline 25 sec RLE, 8 sec LLE    Time 4    Period Weeks    Status New    Target Date 10/30/21                    Plan - 10/02/21 0934     Clinical Impression Statement 50 yo lady with left lateral hip pain radiating down lateral leg to foot.  Presents with significant tenderness to palpation left greater trochanter and inability to maintain single leg stance on LLE.  Presents with LLE pain limiting her standing/walking tolerance and requiring activity modification due to pain/dysfunction of LLE.  Pt would benefit from PT services to decrease pain and improve LE strength and functional activity tolerance to enable normalized gait pattern    Personal Factors and Comorbidities Comorbidity 1;Time since onset of injury/illness/exacerbation    Comorbidities Ca     Examination-Activity Limitations Bend;Carry;Stairs;Stand;Locomotion Level    Examination-Participation Restrictions Shop;Occupation;Meal Prep    Stability/Clinical Decision Making Stable/Uncomplicated    Clinical Decision Making Low    Rehab Potential Good    PT Frequency 1x / week    PT Duration 4 weeks    PT Treatment/Interventions ADLs/Self Care Home Management;DME Instruction;Gait training;Stair training;Functional mobility training;Therapeutic activities;Therapeutic exercise;Balance training;Patient/family education;Manual techniques;Energy conservation;Dry needling;Ultrasound    PT Next Visit Plan continue with LLE hip pain and strengthening    PT Home Exercise Plan QS, hip abd/add isometric    Consulted and Agree with Plan of Care Patient             Patient will benefit from skilled therapeutic intervention in order to improve the following deficits and impairments:  Abnormal gait, Decreased activity tolerance, Decreased balance, Decreased strength, Difficulty walking, Impaired perceived functional ability, Pain, Improper body mechanics  Visit Diagnosis: Pain in left hip  Muscle weakness (generalized)  Difficulty in walking, not elsewhere classified     Problem List Patient Active Problem List   Diagnosis Date Noted   Port-A-Cath in place 12/29/2020   Genetic testing 10/08/2020   Family history of breast cancer 09/22/2020   Primary malignant neoplasm of upper outer quadrant of female breast, right ([place]) 08/27/2020    Toniann Fail, PT 10/02/2021, 10:07 AM  [facility] [address] [place], [place], [address] Phone: [phone]   Fax:  [phone]  Name: Monica [name] MRN: [id number] Date of Birth: 04/24/1971

## 2021-10-05 ENCOUNTER — Encounter (HOSPITAL_COMMUNITY): Payer: Self-pay | Admitting: Physical Therapy

## 2021-10-05 ENCOUNTER — Other Ambulatory Visit: Payer: Self-pay

## 2021-10-05 ENCOUNTER — Ambulatory Visit (HOSPITAL_COMMUNITY): Payer: Medicaid Other | Admitting: Physical Therapy

## 2021-10-05 DIAGNOSIS — M6281 Muscle weakness (generalized): Secondary | ICD-10-CM

## 2021-10-05 DIAGNOSIS — M25552 Pain in left hip: Secondary | ICD-10-CM

## 2021-10-05 DIAGNOSIS — R262 Difficulty in walking, not elsewhere classified: Secondary | ICD-10-CM

## 2021-10-05 NOTE — Therapy (Signed)
[place], [place], [address] Phone: [phone]   Fax:  [phone]  Physical Therapy Treatment  Patient Details  Name: Monica [name] MRN: [id number] Date of Birth: [birth year] No data recorded  Encounter Date: 10/05/2021   PT End of Session - 10/05/21 0754     Visit Number 2    Number of Visits 4    Date for PT Re-Evaluation 10/30/21    Authorization Type [place] Medicaid [employer], auth required    PT Start Time [phone]    PT Stop Time 0825    PT Time Calculation (min) 39 min    Activity Tolerance Patient limited by pain    Behavior During Therapy [facility] for tasks assessed/performed             Past Medical History:  Diagnosis Date   Asthma    Breast cancer ([place])    Family history of breast cancer 09/22/2020   Hypertension    Personal history of chemotherapy    Personal history of radiation therapy     Past Surgical History:  Procedure Laterality Date   ABDOMINAL HYSTERECTOMY     APPENDECTOMY     BREAST LUMPECTOMY WITH RADIOACTIVE SEED AND SENTINEL LYMPH NODE BIOPSY Right 09/10/2020   Procedure: RIGHT BREAST LUMPECTOMY WITH RADIOACTIVE SEED AND SENTINEL [place];  Surgeon: Erroll Luna, MD;  Location: [place];  Service: General;  Laterality: Right;   PORTACATH PLACEMENT Right 10/16/2020   Procedure: INSERTION PORT-A-CATH WITH ULTRASOUND GUIDANCE;  Surgeon: Erroll Luna, MD;  Location: [place];  Service: General;  Laterality: Right;   RE-EXCISION OF BREAST LUMPECTOMY Right 10/16/2020   Procedure: RE-EXCISION OF RIGHT BREAST LUMPECTOMY;  Surgeon: Erroll Luna, MD;  Location: [facility];  Service: General;  Laterality: Right;    There were no vitals filed for this visit.   Subjective Assessment - 10/05/21 0750     Subjective States that she has about the same soreness along the lateral side of her left hip 9/10. States she did  the exercises once every other day    Pertinent History Breast CA with one more chemo session    Currently in Pain? Yes    Pain Score 9     Pain Location Hip    Pain Orientation Left    Pain Descriptors / Indicators Sore    Pain Onset 1 to 4 weeks ago                [facility] PT Assessment - 10/05/21 0001       Assessment   Medical Diagnosis LLE pain                           [facility] Adult PT Treatment/Exercise - 10/05/21 0001       Exercises   Exercises Lumbar      Lumbar Exercises: Stretches   Double Knee to Chest Stretch 2 reps   2 minutes   Lower Trunk Rotation 2 reps   2 minutes with ball     Lumbar Exercises: Seated   Sit to Stand 10 reps   2 sets     Lumbar Exercises: Supine   Other Supine Lumbar Exercises hip add isometrics with ball 2x10 5" holds      Modalities   Modalities Moist Heat      Moist Heat Therapy   Number Minutes Moist Heat 15 Minutes  Moist Heat Location Hip   left                      PT Short Term Goals - 10/02/21 1002       PT SHORT TERM GOAL #1   Title Patient will be independent with HEP in order to improve functional outcomes    Time 2    Period Weeks    Status New    Target Date 10/16/21      PT SHORT TERM GOAL #2   Title Patient will report at least 25% improvement in symptoms for improved quality of life.    Baseline 9/10 left hip    Time 2    Period Weeks    Status New    Target Date 10/16/21      PT SHORT TERM GOAL #3   Title Demo 3+/5 left hip strength to improve activity tolerance    Baseline 3-/5 gross LLE    Time 2    Period Weeks    Status New    Target Date 10/16/21               PT Long Term Goals - 10/02/21 1004       PT LONG TERM GOAL #1   Title Improve ambulation tolerance/velocity per 250 ft 2MWT    Baseline 175 ft with LLE antalgia    Time 4    Period Weeks    Status New    Target Date 10/30/21      PT LONG TERM GOAL #2   Title Demo improve LLE strength  and decreased pain as evidenced by 25 sec Sinlge Leg Stance LLE    Baseline 25 sec RLE, 8 sec LLE    Time 4    Period Weeks    Status New    Target Date 10/30/21                   Plan - 10/05/21 0754     Clinical Impression Statement Poor tolerance to interventions on this date. Patient reported heat felt good but isometrics were challenging and required rest breaks between reps secondary to difficulty. Fatigue in legs with 2 minute double knee to chest exercise on ball. Added new ball exercises to HEP as patient has ball at home.  Encouraged patient throughout session and allowed for long rest breaks secondary to fatigue. 8/10 sorenes snoted end of session. Will continue with current POC.    Personal Factors and Comorbidities Comorbidity 1;Time since onset of injury/illness/exacerbation    Comorbidities Ca    Examination-Activity Limitations Bend;Carry;Stairs;Stand;Locomotion Level    Examination-Participation Restrictions Shop;Occupation;Meal Prep    Stability/Clinical Decision Making Stable/Uncomplicated    Rehab Potential Good    PT Frequency 1x / week    PT Duration 4 weeks    PT Treatment/Interventions ADLs/Self Care Home Management;DME Instruction;Gait training;Stair training;Functional mobility training;Therapeutic activities;Therapeutic exercise;Balance training;Patient/family education;Manual techniques;Energy conservation;Dry needling;Ultrasound    PT Next Visit Plan continue with LLE hip pain and strengthening    PT Home Exercise Plan QS, hip abd/add isometric, DKC and LTR on ball    Consulted and Agree with Plan of Care Patient             Patient will benefit from skilled therapeutic intervention in order to improve the following deficits and impairments:  Abnormal gait, Decreased activity tolerance, Decreased balance, Decreased strength, Difficulty walking, Impaired perceived functional ability, Pain, Improper body mechanics  Visit Diagnosis: Pain in left  hip  Muscle weakness (generalized)  Difficulty in walking, not elsewhere classified     Problem List Patient Active Problem List   Diagnosis Date Noted   Port-A-Cath in place 12/29/2020   Genetic testing 10/08/2020   Family history of breast cancer 09/22/2020   Primary malignant neoplasm of upper outer quadrant of female breast, right ([place]) 08/27/2020   8:28 AM, 10/05/21 Jerene Pitch, DPT Physical Therapy with [facility]  [phone] office   [place] [address] [place], [place], [address] Phone: [phone]   Fax:  [phone]  Name: Bethsaida Siegenthaler MRN: [id number] Date of Birth: [birth year]

## 2021-10-06 ENCOUNTER — Ambulatory Visit (HOSPITAL_COMMUNITY)
Admission: RE | Admit: 2021-10-06 | Discharge: 2021-10-06 | Disposition: A | Discharge status: Home / Self Care | Payer: Medicaid Other | Source: Ambulatory Visit | Attending: Hematology and Oncology | Admitting: Hematology and Oncology

## 2021-10-06 ENCOUNTER — Telehealth: Payer: Self-pay

## 2021-10-06 DIAGNOSIS — Z01818 Encounter for other preprocedural examination: Secondary | ICD-10-CM | POA: Diagnosis not present

## 2021-10-06 DIAGNOSIS — I10 Essential (primary) hypertension: Secondary | ICD-10-CM | POA: Diagnosis not present

## 2021-10-06 DIAGNOSIS — C50411 Malignant neoplasm of upper-outer quadrant of right female breast: Secondary | ICD-10-CM | POA: Insufficient documentation

## 2021-10-06 DIAGNOSIS — Z0189 Encounter for other specified special examinations: Secondary | ICD-10-CM

## 2021-10-06 LAB — ECHOCARDIOGRAM COMPLETE
Area-P 1/2: 3.89 cm2
S' Lateral: 2.9 cm

## 2021-10-06 NOTE — Progress Notes (Signed)
  Echocardiogram 2D Echocardiogram has been performed.  Monica [name] 10/06/2021, 10:55 AM

## 2021-10-06 NOTE — Telephone Encounter (Signed)
Pt called and states she fell today when she went to have echo. Pt denies new pain/swelling/bruising and states it did not effect her gait; denies hitting her head. Pt knows to call with worsening sx.

## 2021-10-07 ENCOUNTER — Encounter (HOSPITAL_COMMUNITY): Payer: Medicaid Other | Admitting: Physical Therapy

## 2021-10-08 NOTE — Progress Notes (Signed)
I, Monica [name], LAT, ATC acting as a scribe for Monica Leader, MD.  Subjective:    CC: L hip pain  HPI: Pt is a 50 y/o female c/o L hip pain ongoing since 09/08/21. Of note, Pt has a hx of R breast cancer and underwent lumpectomy followed by re-excision, adjuvant chemotherapy, and radiation, currently undergoing Herceptin maintenance therapy. Pt was referred to PT by her oncologist and has completed 2 visits. Pt notes pain worsened on 10/06/21 because she suffered a fall. Pt locates pain to lateral (slightly posterior) portion of her L hip.  She has to physical therapy twice as above.  However she is having a lot of pain with physical therapy and having trouble with home exercise program.  She lives in [facility] and is going to physical therapy in [place].  Low back pain: no Radiates: yes- lateral aspect of thigh to L foot/toes LE Numbness/tingling: yes LE Weakness: yes Aggravates: standing, transitioning from sitting to stand Treatments tried: prednisone, PT x2, HEP, walking  Dx imaging: 09/22/21 L hip XR  Pertinent review of Systems: No fevers or chills  Relevant historical information: Breast cancer   Objective:    Vitals:   10/09/21 1014  BP: (!) 148/92  Pulse: 88  SpO2: 96%   General: Well Developed, well nourished, and in no acute distress.   MSK: Left hip: Normal-appearing Tender palpation greater trochanter. Hip abduction strength diminished 3+/5.  External rotation strength diminished 3+/5.  Both with pain. Antalgic gait. L-spine: Normal-appearing Nontender normal motion negative slump test.  Lab and Radiology Results   Hip greater trochanteric injection: Left Consent obtained and timeout performed. Area of maximum tenderness palpated and identified. Skin cleaned with alcohol, cold spray applied. A spinal needle was used to access the greater trochanteric bursa. 40mg  of Kenalog and 2 mL of lidocaine were used to inject the trochanteric  bursa. Patient tolerated the procedure well.   EXAM: DG HIP (WITH OR WITHOUT PELVIS) 2-3V LEFT   COMPARISON:  None   FINDINGS: No focal bone lesion seen affecting the pelvis or either hip. No joint space narrowing of either hip. No other focal bone finding. Chronic calcifications adjacent to both greater trochanters. Sacroiliac joints and symphysis pubis appear normal.   IMPRESSION: Negative radiographs.  No abnormality seen to explain left hip pain.     Electronically Signed   By: Nelson Chimes M.D.   On: 09/23/2021 09:53  I, Monica [name], personally (independently) visualized and performed the interpretation of the images attached in this note.    Impression and Recommendations:    Assessment and Plan: 50 y.o. female with left lateral hip pain primarily due to greater trochanteric bursitis and hip abductor tendinopathy. Monica [name] has significant weakness of the hip abductors and external rotators and is having quite a bit of pain. Physical therapy is the correct treatment as strengthening and stretching will ultimately improve her pain.  However she is hurting so much that I do not think that she can effectively participate in physical therapy.  We will proceed with steroid injection today and continue physical therapy.  However she lives about 45 minutes away from where she is going to physical therapy.  There are physical therapy locations in [facility] that I think would be a little more convenient for her.  Monica [name] [facility] has a location in [place] that may be a great option..  Plan to recheck in about a month.  If not improved would consider MRI.  This would  help further determine diagnosis and further rule out potential for metastatic disease.  She had an x-ray a month ago which did not show any evidence of bony metastasis therefore I am not very concerned about metastasis but an MRI would help confirm this.  PDMP reviewed during this encounter. Orders Placed This Encounter   Procedures   Ambulatory referral to Physical Therapy    Referral Priority:   Routine    Referral Type:   Physical Medicine    Referral Reason:   Specialty Services Required    Requested Specialty:   Physical Therapy    Number of Visits Requested:   1   Meds ordered this encounter  Medications   HYDROcodone-acetaminophen (NORCO/VICODIN) 5-325 MG tablet    Sig: Take 1 tablet by mouth every 6 (six) hours as needed.    Dispense:  15 tablet    Refill:  0    Discussed warning signs or symptoms. Please see discharge instructions. Patient expresses understanding.   The above documentation has been reviewed and is accurate and complete Monica [name], M.D.

## 2021-10-09 ENCOUNTER — Ambulatory Visit: Payer: Self-pay

## 2021-10-09 ENCOUNTER — Other Ambulatory Visit: Payer: Self-pay

## 2021-10-09 ENCOUNTER — Encounter: Payer: Self-pay | Admitting: *Deleted

## 2021-10-09 ENCOUNTER — Ambulatory Visit (INDEPENDENT_AMBULATORY_CARE_PROVIDER_SITE_OTHER): Payer: Medicaid Other | Admitting: Family Medicine

## 2021-10-09 VITALS — BP 148/92 | HR 88 | Ht 63.0 in | Wt 193.2 lb

## 2021-10-09 DIAGNOSIS — M25552 Pain in left hip: Secondary | ICD-10-CM | POA: Diagnosis not present

## 2021-10-09 MED ORDER — HYDROCODONE-ACETAMINOPHEN 5-325 MG PO TABS: 1.0000 | ORAL_TABLET | Freq: Four times a day (QID) | ORAL | 0 refills | Status: DC | PRN

## 2021-10-09 NOTE — Patient Instructions (Addendum)
Thank you for coming in today.   You received a steroid injection in your hip today. Seek immediate medical attention if the joint becomes red, extremely painful, or is oozing fluid.   I've referred you to Physical Therapy.  Let us know if you don't hear from them in one week.   Recheck back in 1 month.

## 2021-10-19 NOTE — Progress Notes (Addendum)
Patient Care Team: Raiford Simmonds., PA-C as PCP - General  DIAGNOSIS:    ICD-10-CM   1. Primary malignant neoplasm of upper outer quadrant of female breast, right ([place])  C50.411 MR HIP LEFT W WO CONTRAST      SUMMARY OF ONCOLOGIC HISTORY: Oncology History  Primary malignant neoplasm of upper outer quadrant of female breast, right ([place])  08/12/2020 Initial Diagnosis   Screening mammogram on 07/07/20 showed an asymmetry. Diagnostic mammogram and [place] on 07/29/20 showed a 0.8cm mass at the 10 o'clock position and no right axillary adenopathy. Biopsy on 08/12/20 showed invasive and in situ mammary carcinoma, grade 2, HER-2 equivocal by IHC, positive by FISH, ER+ 80%, PR+ 60%, Ki67 15%.   08/26/2020 Cancer Staging   Staging form: Breast, AJCC 8th Edition - Clinical stage from 08/26/2020: Stage IA (cT1b, cN0, cM0, G2, ER+, PR+, HER2+) - Signed by Eppie Gibson, MD on 08/27/2020    09/10/2020 Surgery   Right lumpectomy ([facility]): invasive lobular carcinoma, 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  Variants of uncertain significance detected in ATM (c.131A>G (p.Asp44Gly) and c.4658A>C (p.Glu1553Ala)) and WRN (c.2825+6G>T (intronic)).  The report date is October 02, 2020.   The Multi-Cancer Panel offered by [facility] includes sequencing and/or deletion duplication testing of the following 85 genes: AIP, ALK, APC, ATM, AXIN2,BAP1,  BARD1, BLM, BMPR1A, BRCA1, BRCA2, BRIP1, CASR, CDC73, CDH1, CDK4, CDKN1B, CDKN1C, CDKN2A (p14ARF), CDKN2A (p16INK4a), CEBPA, CHEK2, CTNNA1, DICER1, DIS3L2, EGFR (c.2369C>T, p.Thr790Met variant only), EPCAM (Deletion/duplication testing  only), FH, FLCN, GATA2, GPC3, GREM1 (Promoter region deletion/duplication testing only), HOXB13 (c.251G>A, p.Gly84Glu), HRAS, KIT, MAX, MEN1, MET, MITF (c.952G>A, p.Glu318Lys variant only), MLH1, MSH2, MSH3, MSH6, MUTYH, NBN, NF1, NF2, NTHL1, PALB2, PDGFRA, PHOX2B, PMS2, POLD1, POLE, POT1, PRKAR1A, PTCH1, PTEN, RAD50, RAD51C, RAD51D, RB1, RECQL4, RET, RNF43, RUNX1, SDHAF2, SDHA (sequence changes only), SDHB, SDHC, SDHD, SMAD4, SMARCA4, SMARCB1, SMARCE1, STK11, SUFU, TERC, TERT, TMEM127, TP53, TSC1, TSC2, VHL, WRN and WT1.   Amended report: The variant of uncertain significance (VUS) in [place] at  c.2825+6G>T (Intronic) has been reclassified to likely benign.  The change in variant classification was made as a result of re-review of evidence in light of new variant interpretation guidelines and/or new information. The amended report date is December 18, 2020.    02/10/2021 -  Radiation Therapy   Adjuvant radiation   04/13/2021 -  Anti-estrogen oral therapy   Initially anastrozole switched to letrozole for fatigue issues     CHIEF COMPLIANT: Herceptin maintenance  INTERVAL HISTORY: Monica [name] is a 50 y.o. with above-mentioned history of right breast cancer underwent a right lumpectomy followed by re-excision, adjuvant chemotherapy, and radiation, currently undergoing Herceptin maintenance therapy. She presents to the clinic [name] for treatment.   Monica [name] is due to finish up her Herceptin therapy [name].  She notes that she is tolerating it quite [name].  She underwent an echocardiogram on October 06, 2021 and this showed an ejection fraction of 60 to 65%.  Monica [name] has had continued left hip pain.  When I saw her on October 18 I gave her a steroid taper in addition sent her to physical therapy.  The steroid taper did not help  she subsequently saw Dr. Georgina Snell in sports medicine on October 09, 2021.  He gave her a steroid injection and she has been able to proceed with physical therapy, however the  pain is still very much present.  During this time.  We have been holding her letrozole since that can contribute to her pain.  ALLERGIES:  has No Known Allergies.  MEDICATIONS:  Current Outpatient Medications  Medication Sig Dispense Refill   albuterol (VENTOLIN HFA) 108 (90 Base) MCG/ACT inhaler Inhale 2 puffs into the lungs every 4 (four) hours as needed for wheezing or shortness of breath. 8 g 3   Cholecalciferol (VITAMIN D3) 125 MCG (5000 UT) TABS Take 1 tablet by mouth daily.     escitalopram (LEXAPRO) 5 MG tablet Take 5 mg by mouth daily.     HYDROcodone-acetaminophen (NORCO/VICODIN) 5-325 MG tablet Take 1 tablet by mouth every 6 (six) hours as needed. 15 tablet 0   lidocaine-prilocaine (EMLA) cream APPLY TO AFFECTED AREA ONCE. 30 g 0   lisinopril (ZESTRIL) 10 MG tablet Take 1 tablet (10 mg total) by mouth daily.     LORazepam (ATIVAN) 0.5 MG tablet Take 0.5 mg by mouth every 8 (eight) hours. As needed     ondansetron (ZOFRAN) 8 MG tablet Take 1 tablet (8 mg total) by mouth 2 (two) times daily as needed (Nausea or vomiting). 30 tablet 1   prazosin (MINIPRESS) 1 MG capsule Take 1 mg by mouth at bedtime.     No current facility-administered medications for this visit.   Facility-Administered Medications Ordered in Other Visits  Medication Dose Route Frequency Provider Last Rate Last Admin   heparin lock flush 100 unit/mL  500 Units Intracatheter Once PRN Monica Lose, MD       sodium chloride flush (NS) 0.9 % injection 10 mL  10 mL Intracatheter PRN Monica Lose, MD       trastuzumab-dkst (OGIVRI) 504 mg in sodium chloride 0.9 % 250 mL chemo infusion  6 mg/kg (Treatment Plan Recorded) Intravenous Once Monica Lose, MD        PHYSICAL EXAMINATION: ECOG PERFORMANCE STATUS: 1 - Symptomatic but completely ambulatory  Vitals:   10/20/21 0754  BP: 123/70  Pulse: 98  Resp: 18  Temp: (!) 97.2 F (36.2 C)  SpO2: 100%   Filed Weights   10/20/21 0754  Weight: 191 lb 11.2 oz (87  kg)  GENERAL: Patient is a [name] appearing female in no acute distress HEENT:  Sclerae anicteric.  Oropharynx clear and moist. No ulcerations or evidence of oropharyngeal candidiasis. Neck is supple.  NODES:  No cervical, supraclavicular, or axillary lymphadenopathy palpated.  BREAST EXAM:  Deferred. LUNGS:  Clear to auscultation bilaterally.  No wheezes or rhonchi. HEART:  Regular rate and rhythm. No murmur appreciated. ABDOMEN:  Soft, nontender.  Positive, normoactive bowel sounds. No organomegaly palpated. MSK:  No focal spinal tenderness to palpation. Full range of motion bilaterally in the upper extremities. EXTREMITIES:  No peripheral edema.   SKIN:  Clear with no obvious rashes or skin changes. No nail dyscrasia. NEURO:  Nonfocal. [name] oriented.  Appropriate affect.   LABORATORY DATA:  I have reviewed the data as listed CMP Latest Ref Rng & Units 09/28/2021 07/06/2021 05/25/2021  Glucose 70 - 99 mg/dL 94 108(H) 96  BUN 6 - 20 mg/dL 8 10 11   Creatinine 0.44 - 1.00 mg/dL 0.67 1.05(H) 0.96  Sodium 135 - 145 mmol/L 137 139 140  Potassium 3.5 - 5.1 mmol/L 3.8 3.8 3.8  Chloride  98 - 111 mmol/L 107 108 108  CO2 22 - 32 mmol/L 24 25 29   Calcium 8.9 - 10.3 mg/dL 8.8(L) 9.2 8.9  Total Protein 6.5 - 8.1 g/dL 7.1 6.9 7.3  Total Bilirubin 0.3 - 1.2 mg/dL 0.4 0.6 0.4  Alkaline Phos 38 - 126 U/L 102 102 124  AST 15 - 41 U/L 34 32 25  ALT 0 - 44 U/L 28 27 23     Lab Results  Component Value Date   WBC 5.6 09/28/2021   HGB 12.5 09/28/2021   HCT 34.4 (L) 09/28/2021   MCV 77.1 (L) 09/28/2021   PLT 201 09/28/2021   NEUTROABS 3.7 09/28/2021    ASSESSMENT & PLAN:  Primary malignant neoplasm of upper outer quadrant of female breast, right ([place]) 09/10/2020:Right lumpectomy ([facility]): invasive lobular carcinoma, 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  ER 80%, PR 60%, Ki-67 15%, HER-2 equivocal by IHC positive for fish 10/11/20:  Re-excision: Neg   Recommendation: 1. Adjuvant chemotherapy with Taxol Herceptin followed by Herceptin maintenance for 1 year 2. Adjuvant radiation therapy 02/10/21- 03/24/21 3. Adjuvant antiestrogen therapy with anastrozole (patient is postmenopausal) started 04/13/2021 and anti-HER-2 therapy with neratinib -------------------------------------------------------------------------------------------------------------------------------- Current treatment: Herceptin maintenance q 3 weeks with anastrozole, switched to letrozole 07/06/2021 Herceptin maintenance will continue till November 2022 Echocardiogram on October 06, 2021 shows an EF of 60 to 65%   Letrozole Toxicities:    We are holding the letrozole right now due to the left hip pain.   Left lateral leg numbness/pain: 09/23/2021: Hip x-ray: Negative, treated with Medrol dose pack without any relief.    She was seen by Dr. Georgina Snell and received a cortisone injection on October 09, 2021.  Since this pain is not as improved has we would have hoped, I will order a left hip MRI with and without contrast to further evaluate for any potential tear or occult fracture.  Dr. Sonny Dandy did pop in and talk to Monica [name].  And he reviewed the above plan and is in agreement with it.  Please see his addendum for details.  Monica [name] will proceed with her final Herceptin treatment [name].  We will get the MRI set up.  She will remain off of the letrozole, and will follow-up with Dr. Payton Mccallum in 4 weeks to reassess and discuss.  Since she is completing Herceptin [name], she can go ahead and have her port removed.  I will send a message to Dr. Brantley Stage to get this arranged.  I spoke with Dr. Georgina Snell and reviewed this plan with him, and he is in agreement.     Orders Placed This Encounter  Procedures   MR HIP LEFT W WO CONTRAST    Standing Status:   Future    Standing Expiration Date:   10/20/2022    Order Specific Question:   If indicated for the ordered  procedure, I authorize the administration of contrast media per Radiology protocol    Answer:   Yes    Order Specific Question:   What is the patient's sedation requirement?    Answer:   No Sedation    Order Specific Question:   Does the patient have a pacemaker or implanted devices?    Answer:   No    Order Specific Question:   Preferred imaging location?    Answer:   [facility] (table limit - 550 lbs)   The patient has a good understanding of the overall plan.  she agrees with it. she will call with any problems that may develop before the next visit here.  Total time spent: 20 mins including face to face time and time spent for planning, charting and coordination of care  Monica Bihari, NP 10/20/21 9:57 AM Medical Oncology and Hematology [facility] [place], [place] [address] Tel. [phone]    Fax. [phone]  *Total Encounter Time as defined by the Centers for Medicare and Medicaid Services includes, in addition to the face-to-face time of a patient visit (documented in the note above) non-face-to-face time: obtaining and reviewing outside history, ordering and reviewing medications, tests or procedures, care coordination (communications with other health care professionals or caregivers) and documentation in the medical record.   I, Monica [name], am acting as scribe for Dr. Nicholas [name].  I, Monica Bihari, NP  have reviewed the above documentation for accuracy and completeness, and I agree with the above  Attending Note  I personally saw and examined Monica [name]. The plan of care was discussed with her. I agree with the physical exam findings and assessment and plan as documented above. I performed the majority of the counseling and assessment and plan regarding this encounter Patient will complete her last Herceptin [name].  We will request Dr. Brantley Stage to remove the port. Left hip pain: We will obtain an MRI of the hip.   It appears that the injection that she received has helped her but not relieve the pain.  Pain is significantly worse when she sits for long period of time.  We are holding antiestrogen therapy for the time being. Return to clinic after the MRI to discuss results. Signed Harriette Ohara, MD

## 2021-10-20 ENCOUNTER — Inpatient Hospital Stay: Payer: Medicaid Other

## 2021-10-20 ENCOUNTER — Inpatient Hospital Stay: Payer: Medicaid Other | Attending: Hematology and Oncology | Admitting: Adult Health

## 2021-10-20 ENCOUNTER — Encounter: Payer: Self-pay | Admitting: *Deleted

## 2021-10-20 ENCOUNTER — Encounter: Payer: Self-pay | Admitting: Adult Health

## 2021-10-20 ENCOUNTER — Other Ambulatory Visit: Payer: Self-pay

## 2021-10-20 DIAGNOSIS — Z5112 Encounter for antineoplastic immunotherapy: Secondary | ICD-10-CM | POA: Insufficient documentation

## 2021-10-20 DIAGNOSIS — C50411 Malignant neoplasm of upper-outer quadrant of right female breast: Secondary | ICD-10-CM | POA: Insufficient documentation

## 2021-10-20 DIAGNOSIS — Z170 Estrogen receptor positive status [ER+]: Secondary | ICD-10-CM | POA: Diagnosis not present

## 2021-10-20 MED ORDER — SODIUM CHLORIDE 0.9 % IV SOLN: Freq: Once | INTRAVENOUS | Status: AC

## 2021-10-20 MED ORDER — DIPHENHYDRAMINE HCL 25 MG PO CAPS
50.0000 mg | ORAL_CAPSULE | Freq: Once | ORAL | Status: AC
  Administered 2021-10-20: 50 mg via ORAL
  Filled 2021-10-20: qty 2

## 2021-10-20 MED ORDER — ACETAMINOPHEN 325 MG PO TABS
650.0000 mg | ORAL_TABLET | Freq: Once | ORAL | Status: AC
  Administered 2021-10-20: 650 mg via ORAL
  Filled 2021-10-20: qty 2

## 2021-10-20 MED ORDER — TRASTUZUMAB-DKST CHEMO 150 MG IV SOLR
6.0000 mg/kg | Freq: Once | INTRAVENOUS | Status: AC
  Administered 2021-10-20: 504 mg via INTRAVENOUS
  Filled 2021-10-20: qty 24

## 2021-10-20 MED ORDER — SODIUM CHLORIDE 0.9% FLUSH
10.0000 mL | INTRAVENOUS | Status: DC | PRN
  Administered 2021-10-20: 10 mL

## 2021-10-20 MED ORDER — HEPARIN SOD (PORK) LOCK FLUSH 100 UNIT/ML IV SOLN
500.0000 [IU] | Freq: Once | INTRAVENOUS | Status: AC | PRN
  Administered 2021-10-20: 500 [IU]

## 2021-10-20 NOTE — Assessment & Plan Note (Addendum)
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 Adjuvant radiation therapy3/8/22-4/19/22 3. Adjuvant antiestrogen therapywith anastrozole (patient is postmenopausal)started 04/13/2021 and anti-HER-2 therapy with neratinib -------------------------------------------------------------------------------------------------------------------------------- Current treatment:Herceptin maintenance q 3 weekswith anastrozole, switched to letrozole 8/1/2022Herceptin maintenance will continue till November 2022 Echocardiogram on October 06, 2021 shows an EF of 60 to 65%  LetrozoleToxicities:   We are holding the letrozole right now due to the left hip pain.  Left lateral leg numbness/pain: 09/23/2021: Hip x-ray: Negative, treated with Medrol dose pack without any relief.    She was seen by Dr. Georgina Snell and received a cortisone injection on October 09, 2021.  Since this pain is not as improved has we would have hoped, I will order a left hip MRI with and without contrast to further evaluate for any potential tear or occult fracture.  Dr. Sonny Dandy did pop in and talk to [place] today as well.  And he reviewed the above plan and is in agreement with it.  Please see his addendum for details.  Ricquel will proceed with her final Herceptin treatment today.  We will get the MRI set up.  She will remain off of the letrozole, and will follow-up with Dr. Payton Mccallum in 4 weeks to reassess and discuss.  Since she is completing Herceptin today, she can go ahead and have her port removed.  I will send a message to Dr. Brantley Stage to get this arranged.  I spoke with Dr. Georgina Snell and reviewed this plan with  him, and he is in agreement.

## 2021-10-20 NOTE — Progress Notes (Signed)
Per MD request RN successfully faxed Medical Clearance for Dental Treatment to [facility] [phone]).

## 2021-10-20 NOTE — Patient Instructions (Addendum)
[facility]  Discharge Instructions: Thank you for choosing [facility] to provide your oncology and hematology care.   If you have a lab appointment with the Cancer Center, please go directly to the Cancer Center and check in at the registration area.   Wear comfortable clothing and clothing appropriate for easy access to any Portacath or PICC line.   We strive to give you quality time with your provider. You may need to reschedule your appointment if you arrive late (15 or more minutes).  Arriving late affects you and other patients whose appointments are after yours.  Also, if you miss three or more appointments without notifying the office, you may be dismissed from the clinic at the provider's discretion.      For prescription refill requests, have your pharmacy contact our office and allow 72 hours for refills to be completed.    Today you received the following chemotherapy and/or immunotherapy agent: Trastuzumab (Ogivri)   To help prevent nausea and vomiting after your treatment, we encourage you to take your nausea medication as directed.  BELOW ARE SYMPTOMS THAT SHOULD BE REPORTED IMMEDIATELY: *FEVER GREATER THAN 100.4 F (38 C) OR HIGHER *CHILLS OR SWEATING *NAUSEA AND VOMITING THAT IS NOT CONTROLLED WITH YOUR NAUSEA MEDICATION *UNUSUAL SHORTNESS OF BREATH *UNUSUAL BRUISING OR BLEEDING *URINARY PROBLEMS (pain or burning when urinating, or frequent urination) *BOWEL PROBLEMS (unusual diarrhea, constipation, pain near the anus) TENDERNESS IN MOUTH AND THROAT WITH OR WITHOUT PRESENCE OF ULCERS (sore throat, sores in mouth, or a toothache) UNUSUAL RASH, SWELLING OR PAIN  UNUSUAL VAGINAL DISCHARGE OR ITCHING   Items with * indicate a potential emergency and should be followed up as soon as possible or go to the Emergency Department if any problems should occur.  Please show the CHEMOTHERAPY ALERT CARD or IMMUNOTHERAPY ALERT CARD at  check-in to the Emergency Department and triage nurse.  Should you have questions after your visit or need to cancel or reschedule your appointment, please contact [facility]  Dept: [phone]  and follow the prompts.  Office hours are 8:00 a.m. to 4:30 p.m. Monday - Friday. Please note that voicemails left after 4:00 p.m. may not be returned until the following business day.  We are closed weekends and major holidays. You have access to a nurse at all times for urgent questions. Please call the main number to the clinic Dept: [phone] and follow the prompts.   For any non-urgent questions, you may also contact your provider using MyChart. We now offer e-Visits for anyone 18 and older to request care online for non-urgent symptoms. For details visit [email or web address].   Also download the MyChart app! Go to the app store, search "MyChart", open the app, select [facility], and log in with your MyChart username and password.  Due to Covid, a mask is required upon entering the hospital/clinic. If you do not have a mask, one will be given to you upon arrival. For doctor visits, patients may have 1 support person aged 18 or older with them. For treatment visits, patients cannot have anyone with them due to current Covid guidelines and our immunocompromised population.  

## 2021-10-22 ENCOUNTER — Encounter (HOSPITAL_COMMUNITY): Payer: Self-pay | Admitting: Physical Therapy

## 2021-10-22 ENCOUNTER — Ambulatory Visit (HOSPITAL_COMMUNITY): Payer: Medicaid Other | Attending: Hematology and Oncology | Admitting: Physical Therapy

## 2021-10-22 ENCOUNTER — Other Ambulatory Visit: Payer: Self-pay

## 2021-10-22 ENCOUNTER — Encounter (HOSPITAL_COMMUNITY): Payer: Medicaid Other | Admitting: Physical Therapy

## 2021-10-22 DIAGNOSIS — M25552 Pain in left hip: Secondary | ICD-10-CM | POA: Insufficient documentation

## 2021-10-22 DIAGNOSIS — R262 Difficulty in walking, not elsewhere classified: Secondary | ICD-10-CM | POA: Insufficient documentation

## 2021-10-22 DIAGNOSIS — M6281 Muscle weakness (generalized): Secondary | ICD-10-CM | POA: Insufficient documentation

## 2021-10-22 NOTE — Therapy (Signed)
[facility] [address] [place], [place], [address] Phone: [phone]   Fax:  [phone]  Physical Therapy Treatment  Patient Details  Name: Monica [name] MRN: [id number] Date of Birth: [birth year] No data recorded  Encounter Date: 10/22/2021   PT End of Session - 10/22/21 1401     Visit Number 3    Number of Visits 4    Date for PT Re-Evaluation 10/30/21    Authorization Type [place] Medicaid [employer], auth required    Authorization Time Period approved 10 visits 10/31- 12/04/21    PT Start Time 1027    PT Stop Time 2536    PT Time Calculation (min) 43 min    Activity Tolerance Patient limited by pain    Behavior During Therapy [facility] for tasks assessed/performed             Past Medical History:  Diagnosis Date   Asthma    Breast cancer ([place])    Family history of breast cancer 09/22/2020   Hypertension    Personal history of chemotherapy    Personal history of radiation therapy     Past Surgical History:  Procedure Laterality Date   ABDOMINAL HYSTERECTOMY     APPENDECTOMY     BREAST LUMPECTOMY WITH RADIOACTIVE SEED AND SENTINEL LYMPH NODE BIOPSY Right 09/10/2020   Procedure: RIGHT BREAST LUMPECTOMY WITH RADIOACTIVE SEED AND SENTINEL [place];  Surgeon: Erroll Luna, MD;  Location: [facility];  Service: General;  Laterality: Right;   PORTACATH PLACEMENT Right 10/16/2020   Procedure: INSERTION PORT-A-CATH WITH ULTRASOUND GUIDANCE;  Surgeon: Erroll Luna, MD;  Location: [facility];  Service: General;  Laterality: Right;   RE-EXCISION OF BREAST LUMPECTOMY Right 10/16/2020   Procedure: RE-EXCISION OF RIGHT BREAST LUMPECTOMY;  Surgeon: Erroll Luna, MD;  Location: [facility];  Service: General;  Laterality: Right;    There were no vitals filed for this visit.   Subjective Assessment - 10/22/21 1402     Subjective She fell on the floor at [facility] long  when trying to sit on a rolling chair. Her hip has been sore. She got a calf cramp the other day but its still sore.    Pertinent History Breast CA with one more chemo session    Currently in Pain? Yes    Pain Score 8     Pain Location Hip    Pain Orientation Left    Pain Descriptors / Indicators Aching;Sore    Pain Type Acute pain    Pain Onset 1 to 4 weeks ago    Pain Frequency Constant                               [facility] Adult PT Treatment/Exercise - 10/22/21 0001       Lumbar Exercises: Stretches   Double Knee to Chest Stretch 2 reps    Double Knee to Chest Stretch Limitations 2 minutes    Lower Trunk Rotation 2 reps    Lower Trunk Rotation Limitations 2 minutes      Lumbar Exercises: Supine   Glut Set 5 seconds;10 reps    Other Supine Lumbar Exercises hip add/abd isometrics with ball/belt 2x10 5" holds                     PT Education - 10/22/21 1402     Education Details HEP    Person(s)  Educated Patient    Methods Explanation;Demonstration    Comprehension Verbalized understanding;Returned demonstration              PT Short Term Goals - 10/02/21 1002       PT SHORT TERM GOAL #1   Title Patient will be independent with HEP in order to improve functional outcomes    Time 2    Period Weeks    Status New    Target Date 10/16/21      PT SHORT TERM GOAL #2   Title Patient will report at least 25% improvement in symptoms for improved quality of life.    Baseline 9/10 left hip    Time 2    Period Weeks    Status New    Target Date 10/16/21      PT SHORT TERM GOAL #3   Title Demo 3+/5 left hip strength to improve activity tolerance    Baseline 3-/5 gross LLE    Time 2    Period Weeks    Status New    Target Date 10/16/21               PT Long Term Goals - 10/02/21 1004       PT LONG TERM GOAL #1   Title Improve ambulation tolerance/velocity per 250 ft 2MWT    Baseline 175 ft with LLE antalgia    Time 4     Period Weeks    Status New    Target Date 10/30/21      PT LONG TERM GOAL #2   Title Demo improve LLE strength and decreased pain as evidenced by 25 sec Sinlge Leg Stance LLE    Baseline 25 sec RLE, 8 sec LLE    Time 4    Period Weeks    Status New    Target Date 10/30/21                   Plan - 10/22/21 1402     Clinical Impression Statement Patient ambulating with gait WFL but continues to state high pain levels in L hip. Patient with quick fatigue with table exercises requiring frequent rest breaks. Continued with isometrics and added abduction today. She notes moderate to high fatigue at end of session. Patient will continue to benefit from skilled physical therapy in order to reduce impairment and improve function.    Personal Factors and Comorbidities Comorbidity 1;Time since onset of injury/illness/exacerbation    Comorbidities Ca    Examination-Activity Limitations Bend;Carry;Stairs;Stand;Locomotion Level    Examination-Participation Restrictions Shop;Occupation;Meal Prep    Stability/Clinical Decision Making Stable/Uncomplicated    Rehab Potential Good    PT Frequency 1x / week    PT Duration 4 weeks    PT Treatment/Interventions ADLs/Self Care Home Management;DME Instruction;Gait training;Stair training;Functional mobility training;Therapeutic activities;Therapeutic exercise;Balance training;Patient/family education;Manual techniques;Energy conservation;Dry needling;Ultrasound    PT Next Visit Plan continue with LLE hip pain and strengthening    PT Home Exercise Plan QS, hip abd/add isometric, DKC and LTR on ball; Hip isometrics add/abd    Consulted and Agree with Plan of Care Patient             Patient will benefit from skilled therapeutic intervention in order to improve the following deficits and impairments:  Abnormal gait, Decreased activity tolerance, Decreased balance, Decreased strength, Difficulty walking, Impaired perceived functional ability, Pain,  Improper body mechanics  Visit Diagnosis: Pain in left hip  Muscle weakness (generalized)  Difficulty in walking, not elsewhere classified  Problem List Patient Active Problem List   Diagnosis Date Noted   Port-A-Cath in place 12/29/2020   Genetic testing 10/08/2020   Family history of breast cancer 09/22/2020   Primary malignant neoplasm of upper outer quadrant of female breast, right ([place]) 08/27/2020    2:47 PM, 10/22/21 Mearl Latin PT, DPT Physical Therapist at [facility] [place], [place], [address] Phone: [phone]   Fax:  [phone]  Name: Monica [name] MRN: [id number] Date of Birth: 03-21-1971

## 2021-10-22 NOTE — Patient Instructions (Signed)
Access Code: [email or web address] URL: [email or web address] Date: 10/22/2021 Prepared by: Mitzi Hansen Damoni Erker  Exercises Hooklying Isometric Hip Abduction with Belt - 1 x daily - 7 x weekly - 2 sets - 10 reps - 5 second hold Supine Hip Adduction Isometric with Ball - 1 x daily - 7 x weekly - 2 sets - 10 reps - 5 second hold

## 2021-10-27 ENCOUNTER — Ambulatory Visit (HOSPITAL_COMMUNITY): Payer: Medicaid Other | Admitting: Physical Therapy

## 2021-10-27 ENCOUNTER — Other Ambulatory Visit: Payer: Self-pay

## 2021-10-27 DIAGNOSIS — M6281 Muscle weakness (generalized): Secondary | ICD-10-CM

## 2021-10-27 DIAGNOSIS — M25552 Pain in left hip: Secondary | ICD-10-CM

## 2021-10-27 DIAGNOSIS — R262 Difficulty in walking, not elsewhere classified: Secondary | ICD-10-CM

## 2021-10-27 NOTE — Therapy (Signed)
[facility] [address] [place], [place], [address] Phone: [phone]   Fax:  [phone]  Physical Therapy Treatment  Patient Details  Name: Monica [name] MRN: [id number] Date of Birth: [birth year] Referring Provider (PT): Lynne Leader MD  PHYSICAL THERAPY DISCHARGE SUMMARY  Visits from Start of Care: 4  Current functional level related to goals / functional outcomes: See below   Remaining deficits: See below   Education / Equipment: See assessment    Patient agrees to discharge. Patient goals were partially met. Patient is being discharged due to being pleased with the current functional level.   Encounter Date: 10/27/2021   PT End of Session - 10/27/21 0910     Visit Number 4    Number of Visits 4    Date for PT Re-Evaluation 10/30/21    Authorization Type [place] Medicaid [employer], auth required    Authorization Time Period approved 10 visits 10/31- 12/04/21    PT Start Time 0904    PT Stop Time 0935    PT Time Calculation (min) 31 min    Activity Tolerance Patient tolerated treatment well    Behavior During Therapy [facility] for tasks assessed/performed             Past Medical History:  Diagnosis Date   Asthma    Breast cancer ([place])    Family history of breast cancer 09/22/2020   Hypertension    Personal history of chemotherapy    Personal history of radiation therapy     Past Surgical History:  Procedure Laterality Date   ABDOMINAL HYSTERECTOMY     APPENDECTOMY     BREAST LUMPECTOMY WITH RADIOACTIVE SEED AND SENTINEL LYMPH NODE BIOPSY Right 09/10/2020   Procedure: RIGHT BREAST LUMPECTOMY WITH RADIOACTIVE SEED AND SENTINEL LYMPH NODE MAPPING;  Surgeon: Erroll Luna, MD;  Location: [facility];  Service: General;  Laterality: Right;   PORTACATH PLACEMENT Right 10/16/2020   Procedure: INSERTION PORT-A-CATH WITH ULTRASOUND GUIDANCE;  Surgeon: Erroll Luna, MD;  Location: [facility];  Service: General;  Laterality: Right;   RE-EXCISION OF BREAST LUMPECTOMY Right 10/16/2020   Procedure: RE-EXCISION OF RIGHT BREAST LUMPECTOMY;  Surgeon: Erroll Luna, MD;  Location: [facility];  Service: General;  Laterality: Right;    There were no vitals filed for this visit.   Subjective Assessment - 10/27/21 0910     Subjective Patient says she is doing better. She is walking more. She reports about 80% improvement since starting therapy. Still has some trouble with prolonged positioning and sitting too long.    Pertinent History Breast CA with one more chemo session    Currently in Pain? No/denies    Pain Onset 1 to 4 weeks ago                [facility] PT Assessment - 10/27/21 0001       Assessment   Medical Diagnosis LLE pain    Referring Provider (PT) Lynne Leader MD    Next MD Visit 11/17/21      Precautions   Precautions None      Restrictions   Weight Bearing Restrictions No      Balance Screen   Has the patient fallen in the past 6 months Yes    How many times? 1    Has the patient had a decrease in activity level because of a fear of falling?  Yes    Is the patient reluctant to leave  their home because of a fear of falling?  No      Home Ecologist residence      Prior Function   Level of Independence Independent      Cognition   Overall Cognitive Status Within Functional Limits for tasks assessed      Strength   Left Hip Flexion 4-/5    Left Hip Extension 3-/5    Left Hip ABduction 4-/5      Ambulation/Gait   Ambulation/Gait Yes    Ambulation/Gait Assistance 7: Independent    Ambulation Distance (Feet) 320 Feet    Assistive device None    Gait Pattern Decreased step length - right;Decreased step length - left;Antalgic;Decreased stance time - left;Decreased arm swing - left    Ambulation Surface Level;Indoor    Gait Comments 2MWT      Balance   Balance Assessed Yes      Static  Standing Balance   Static Standing Balance -  Activities  Single Leg Stance - Right Leg;Single Leg Stance - Left Leg    Static Standing - Comment/# of Minutes 30 sec, 12 sec                                      PT Short Term Goals - 10/27/21 0925       PT SHORT TERM GOAL #1   Title Patient will be independent with HEP in order to improve functional outcomes    Baseline Reports compliance    Time 2    Period Weeks    Status Achieved    Target Date 10/16/21      PT SHORT TERM GOAL #2   Title Patient will report at least 25% improvement in symptoms for improved quality of life.    Baseline Reports 80%    Time 2    Period Weeks    Status Achieved    Target Date 10/16/21      PT SHORT TERM GOAL #3   Title Demo 3+/5 left hip strength to improve activity tolerance    Baseline See MMT    Time 2    Period Weeks    Status Partially Met    Target Date 10/16/21               PT Long Term Goals - 10/27/21 0926       PT LONG TERM GOAL #1   Title Improve ambulation tolerance/velocity per 250 ft 2MWT    Baseline 320 feet    Time 4    Period Weeks    Status Achieved      PT LONG TERM GOAL #2   Title Demo improve LLE strength and decreased pain as evidenced by 25 sec Sinlge Leg Stance LLE    Baseline 30 sec RLE, 12 sec LLE    Time 4    Period Weeks    Status Not Met                   Plan - 10/27/21 1452     Clinical Impression Statement Patient has made moderate progress toward therapy goals. She shows some improved strength and significant improvement in ambulation ability. She also reports significant subjective improvement in function. She appears to be limited by ongoing hip pain and some dynamic instability. Discussed option of continuation of formal therapy versus transition to HEP at this time. Patient  would like to continue with home exercise, she is scheduled for MRI 11/03/21. Reviewed HEP and answered all patient questions.  Encouraged patient to follow up with therapy services with any further questions or concerns.    Personal Factors and Comorbidities Comorbidity 1;Time since onset of injury/illness/exacerbation    Comorbidities Ca    Examination-Activity Limitations Bend;Carry;Stairs;Stand;Locomotion Level    Examination-Participation Restrictions Shop;Occupation;Meal Prep    Stability/Clinical Decision Making Stable/Uncomplicated    Rehab Potential Good    PT Treatment/Interventions ADLs/Self Care Home Management;DME Instruction;Gait training;Stair training;Functional mobility training;Therapeutic activities;Therapeutic exercise;Balance training;Patient/family education;Manual techniques;Energy conservation;Dry needling;Ultrasound    PT Next Visit Plan Dc to HEP    PT Home Exercise Plan QS, hip abd/add isometric, DKC and LTR on ball; Hip isometrics add/abd    Consulted and Agree with Plan of Care Patient             Patient will benefit from skilled therapeutic intervention in order to improve the following deficits and impairments:  Abnormal gait, Decreased activity tolerance, Decreased balance, Decreased strength, Difficulty walking, Impaired perceived functional ability, Pain, Improper body mechanics  Visit Diagnosis: Pain in left hip  Muscle weakness (generalized)  Difficulty in walking, not elsewhere classified     Problem List Patient Active Problem List   Diagnosis Date Noted   Port-A-Cath in place 12/29/2020   Genetic testing 10/08/2020   Family history of breast cancer 09/22/2020   Primary malignant neoplasm of upper outer quadrant of female breast, right ([place]) 08/27/2020   2:56 PM, 10/27/21 Josue Hector PT DPT  Physical Therapist with [facility]  [phone] [place], [place], [address] Phone: [phone]   Fax:  [phone]  Name: Jisel Fleet MRN:  [id number] Date of Birth: [birth year]

## 2021-11-03 ENCOUNTER — Ambulatory Visit (HOSPITAL_COMMUNITY): Admission: RE | Admit: 2021-11-03 | Payer: Medicaid Other | Source: Ambulatory Visit

## 2021-11-06 ENCOUNTER — Ambulatory Visit: Payer: Medicaid Other | Admitting: Family Medicine

## 2021-11-07 ENCOUNTER — Ambulatory Visit (HOSPITAL_COMMUNITY)
Admission: RE | Admit: 2021-11-07 | Discharge: 2021-11-07 | Disposition: A | Discharge status: Home / Self Care | Payer: Medicaid Other | Source: Ambulatory Visit | Attending: Adult Health | Admitting: Adult Health

## 2021-11-07 DIAGNOSIS — C50411 Malignant neoplasm of upper-outer quadrant of right female breast: Secondary | ICD-10-CM

## 2021-11-07 IMAGING — MR MR HIP*L* WO/W CM
9 series · 40 of 40 positions shown · IV contrast (gadavist)
Comparison: X-ray [DATE]

CLINICAL DATA: Left hip pain since [DATE]. History of
breast cancer

EXAM:
MRI OF THE LEFT HIP WITHOUT AND WITH CONTRAST
TECHNIQUE: Multiplanar, multisequence MR imaging was performed both before and
after administration of intravenous contrast.
CONTRAST:  9mL GADAVIST GADOBUTROL 1 MMOL/ML IV SOLN

[Series 8: T1 · coronal · left · 3.0mm · 0.78mm/px · 3 of 26 slices shown]
[im 1/26]
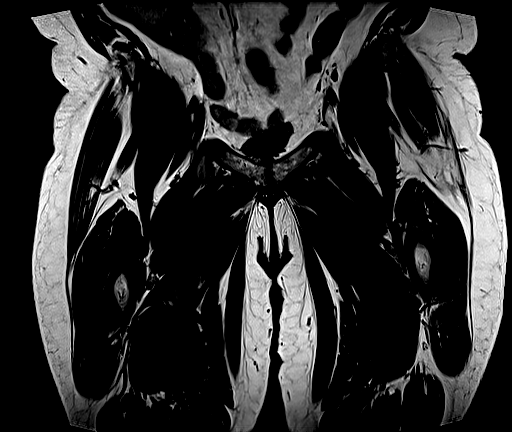
[im 13/26]
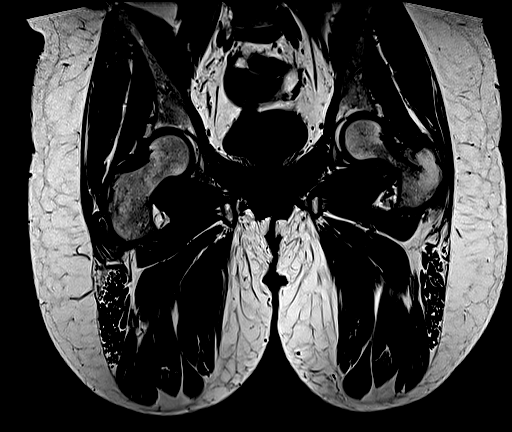
[im 26/26]
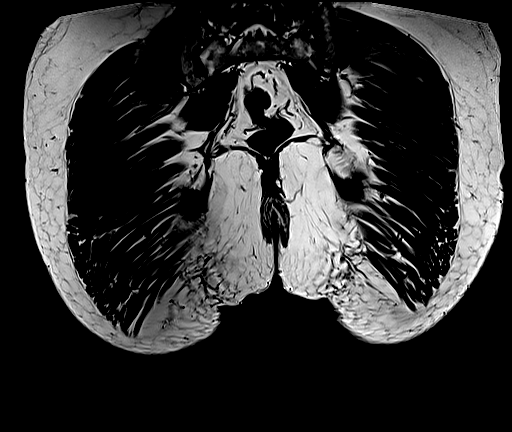

[Series 9: T2 fat-sat · coronal · left · 3.0mm · 1.25mm/px · 4 of 26 slices shown (1 of 2)]
[im 1/26]
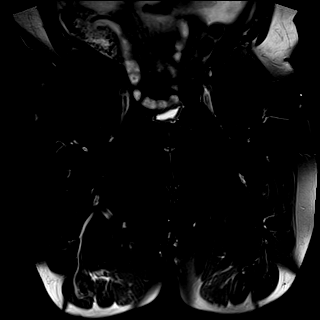
[im 9/26]
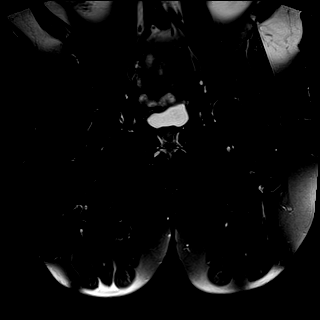
[im 17/26]
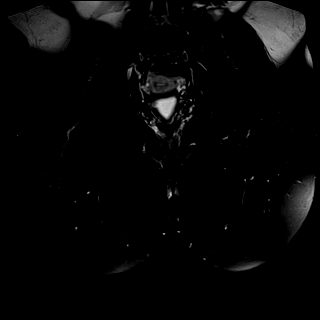
[im 26/26]
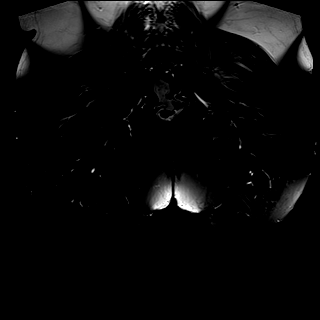

[Series 10: T2 fat-sat · axial · left · 4.0mm · 1.12mm/px · z∈[-83,+111]mm · 6 of 40 slices shown (2 of 2)]
[im 1/40]
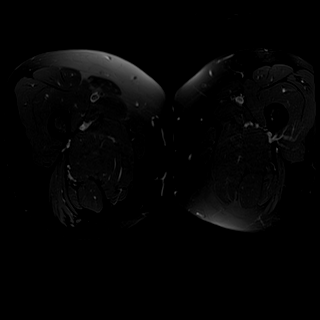
[im 8/40]
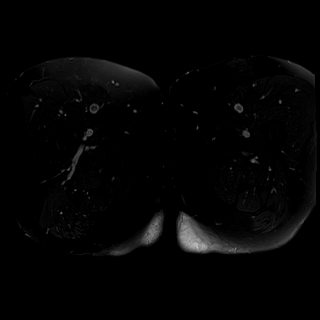
[im 16/40]
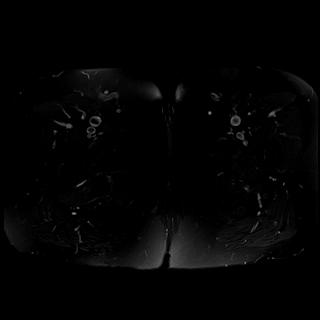
[im 24/40]
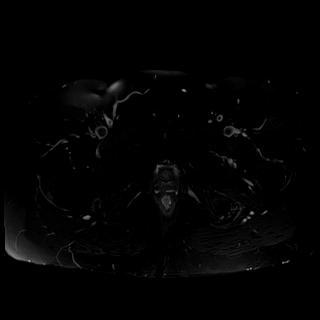
[im 32/40]
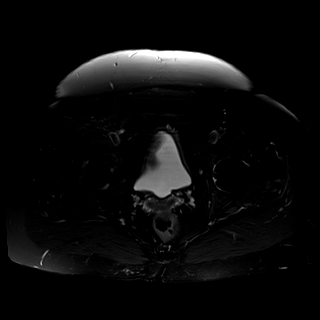
[im 40/40]
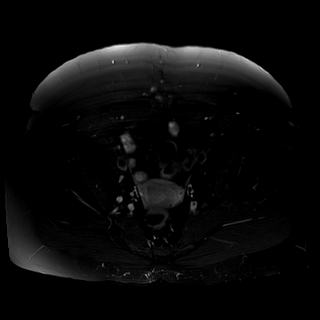

[Series 12: PD · axial · left · 4.0mm · 0.56mm/px · z∈[-57,+137]mm · 6 of 40 slices shown]
[im 1/40]
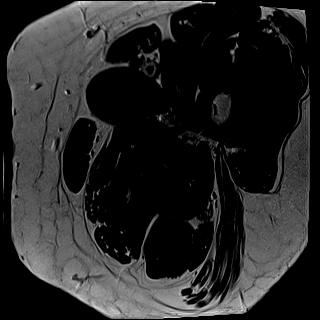
[im 8/40]
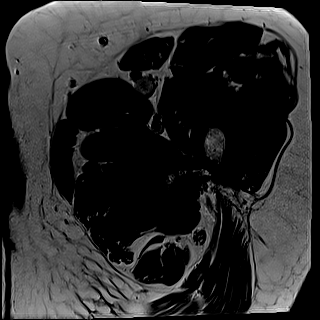
[im 16/40]
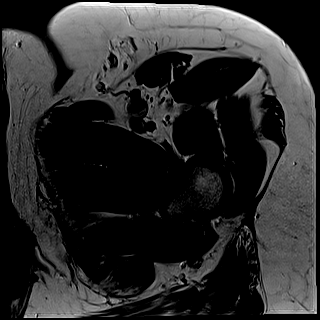
[im 24/40]
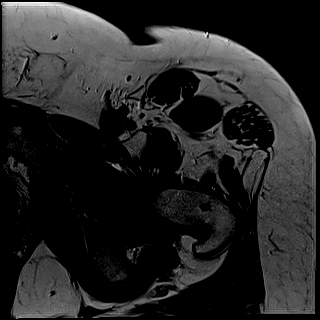
[im 32/40]
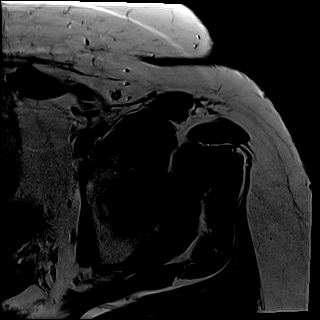
[im 40/40]
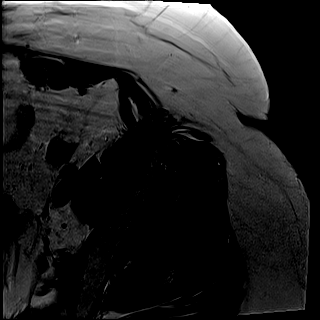

[Series 14: PD fat-sat · sagittal · left · 4.0mm · 0.56mm/px · 3 of 24 slices shown (1 of 2)]
[im 1/24]
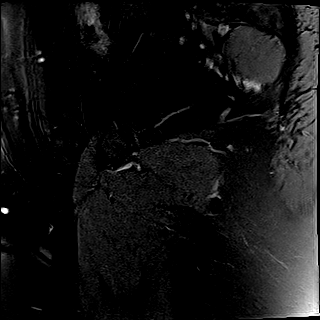
[im 12/24]
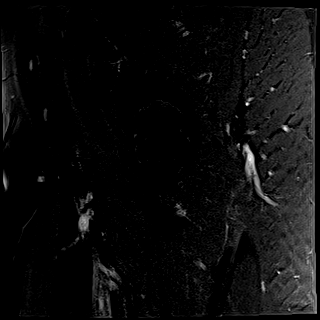
[im 24/24]
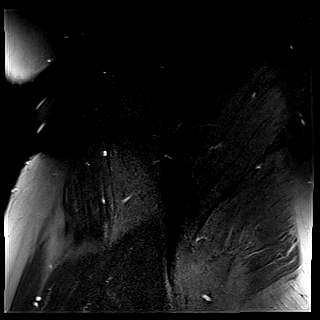

[Series 15: PD fat-sat · coronal · left · 4.0mm · 0.56mm/px · 3 of 20 slices shown (2 of 2)]
[im 1/20]
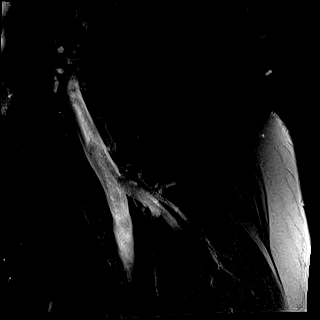
[im 10/20]
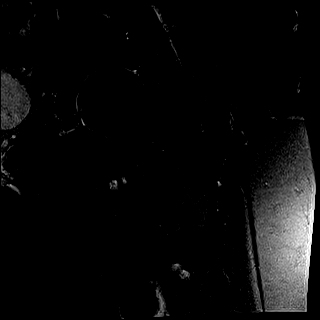
[im 20/20]
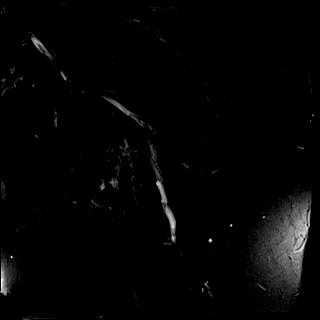

[Series 16: T1 fat-sat · axial · non-contrast · left · 4.0mm · 0.86mm/px · z∈[-61,+132]mm · 6 of 40 slices shown]
[im 1/40]
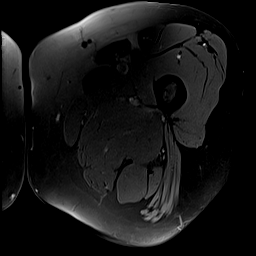
[im 8/40]
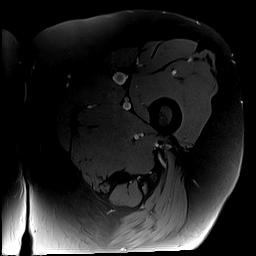
[im 16/40]
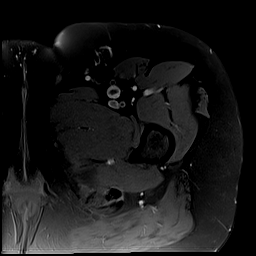
[im 24/40]
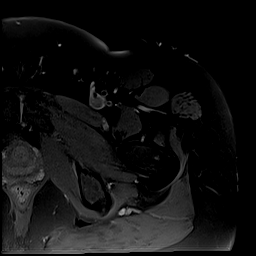
[im 32/40]
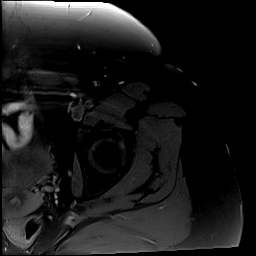
[im 40/40]
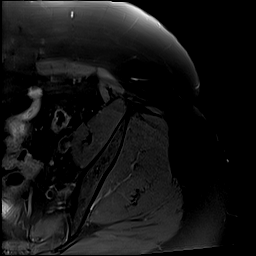

[Series 17: T1 fat-sat post-contrast · axial · left · 4.0mm · 0.86mm/px · z∈[-53,+141]mm · 6 of 40 slices shown (1 of 2)]
[im 1/40]
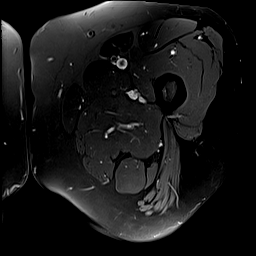
[im 8/40]
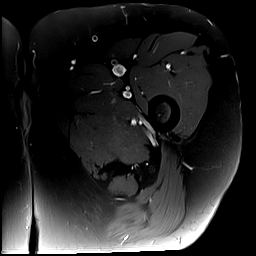
[im 16/40]
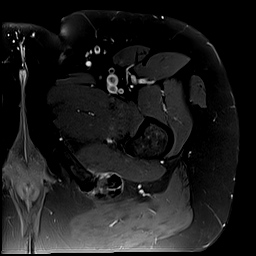
[im 24/40]
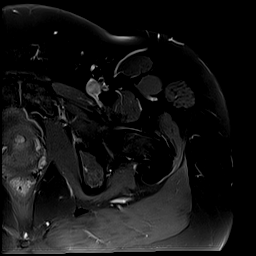
[im 32/40]
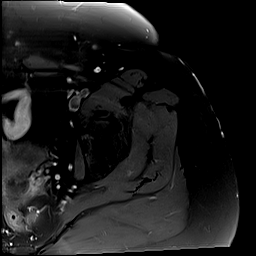
[im 40/40]
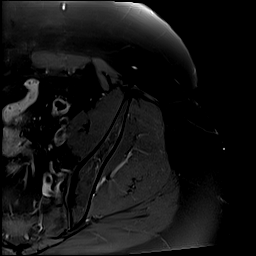

[Series 18: T1 fat-sat post-contrast · coronal · left · 4.0mm · 0.35mm/px · 3 of 20 slices shown (2 of 2)]
[im 1/20]
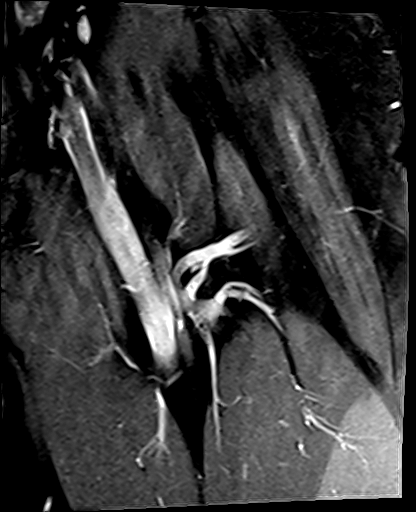
[im 10/20]
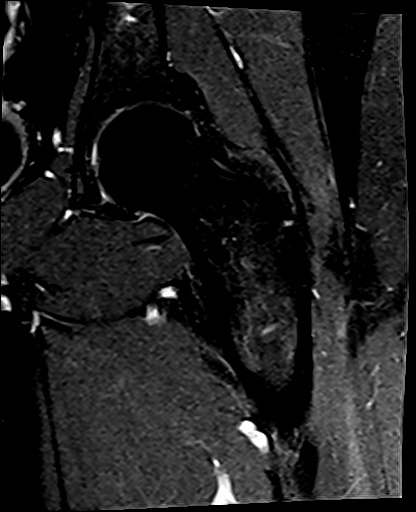
[im 20/20]
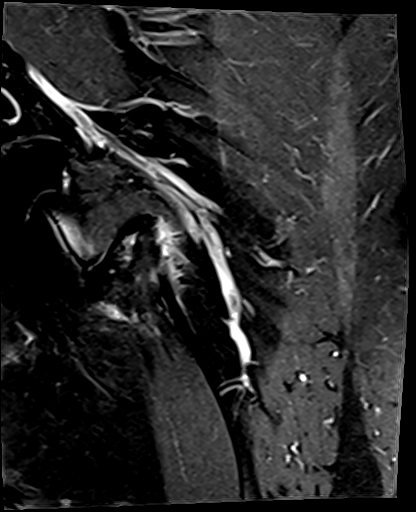

[40 of 40 positions shown; findings below may reference images not displayed]

FINDINGS: Bones: No acute fracture. No dislocation. No femoral head avascular
necrosis. Bony pelvis intact without diastasis. Mild arthropathy of
the SI joints and pubic symphysis. No bone marrow edema. Nonspecific
6 mm T1 hypointense/T2 hyperintense lesion within the
intertrochanteric left femur (series 8, image 17). No definite
postcontrast enhancement, although evaluation is limited given the
lesions small size. Elsewhere, no marrow replacing bone lesion. Well
corticated ossifications are again noted along the posterosuperior
margin of the bilateral greater trochanters without focal marrow
signal abnormality.

Articular cartilage and labrum

Articular cartilage: No chondral defect. No subchondral marrow
signal changes.

Labrum: Grossly intact, although evaluation is limited by lack of
intra-articular fluid or contrast.

Joint or bursal effusion

Joint effusion:  None.

Bursae: No abnormal bursal fluid collection.

Muscles and tendons

Muscles and tendons: Tendinosis with partial-thickness interstitial
tearing of the bilateral hamstring tendon origins, right worse than
left. Mild tendinosis of the bilateral gluteus medius tendons. The
left gluteus minimus, iliopsoas, rectus femoris, and adductor
tendons appear intact without tear or significant tendinosis. Normal
muscle bulk and signal intensity without edema, atrophy, or fatty
infiltration.

Other findings

Miscellaneous: No soft tissue edema or fluid collection. No solid or
cystic mass within the soft tissues. No abnormal postcontrast
enhancement. No acute findings are evident within the pelvis.
IMPRESSION: 1. No acute osseous abnormality or significant arthropathy of the
left hip.
2. Solitary nonspecific 6 mm lesion within the intertrochanteric
left femur. No definite postcontrast enhancement, although
evaluation is limited given the small size. Findings may reflect a
small enchondroma or other benign fibro-osseous lesion. Metastatic
disease is not entirely excluded. Follow-up MRI in 3-6 months could
be performed to assess stability.
3. Tendinosis with partial-thickness interstitial tearing of the
bilateral hamstring tendon origins, right worse than left.
4. Mild tendinosis of the bilateral gluteus medius tendons.

## 2021-11-07 MED ORDER — GADOBUTROL 1 MMOL/ML IV SOLN
9.0000 mL | Freq: Once | INTRAVENOUS | Status: AC | PRN
  Administered 2021-11-07: 9 mL via INTRAVENOUS

## 2021-11-09 NOTE — Progress Notes (Signed)
I, Wendy Poet, LAT, ATC, am serving as scribe for Dr. Lynne Leader.  Monica [name] is a 50 y.o. female who presents to [place] at [facility] today for f/u of L hip pain primarily due to greater trochanteric bursitis and hip abductor tendinopathy.  She was last seen by Dr. Georgina Snell on 10/09/21 and had a L GT steroid injection.  She was advised to con't PT and has completed 4 sessions and was d/c.  She was also prescribed hydrocodone-acetaminophen.  Today, pt reports L hip is still painful, no better. Pt feels "cramps" in her L calf that's been ongoing since her last visit w/ Dr. Georgina Snell.  She continues to experience moderate to severe left lateral hip pain.  Pt has a hx of R breast cancer and underwent lumpectomy followed by re-excision, adjuvant chemotherapy, and radiation, currently undergoing Herceptin maintenance therapy.  Dx imaging: L hip MRI- 11/07/21;  L hip XR- 09/22/21  Pertinent review of systems: No fevers or chills  Relevant historical information: Breast cancer as above   Exam:  BP 138/86   Pulse 86   Ht 5\' 3"  (1.6 m)   Wt 190 lb 12.8 oz (86.5 kg)   SpO2 96%   BMI 33.80 kg/m  General: Well Developed, well nourished, and in no acute distress.   MSK: Left hip normal-appearing Normal motion. Tender palpation greater trochanter.  Not particularly tender at ischial tuberosity.    Lab and Radiology Results No results found for this or any previous visit (from the past 72 hour(s)). MR HIP LEFT W WO CONTRAST  Result Date: 11/09/2021 CLINICAL DATA:  Left hip pain since September of 2021. History of breast cancer EXAM: MRI OF THE LEFT HIP WITHOUT AND WITH CONTRAST TECHNIQUE: Multiplanar, multisequence MR imaging was performed both before and after administration of intravenous contrast. CONTRAST:  68mL GADAVIST GADOBUTROL 1 MMOL/ML IV SOLN COMPARISON:  X-ray 09/22/2021 FINDINGS: Bones: No acute fracture. No dislocation. No femoral head avascular  necrosis. Bony pelvis intact without diastasis. Mild arthropathy of the SI joints and pubic symphysis. No bone marrow edema. Nonspecific 6 mm T1 hypointense/T2 hyperintense lesion within the intertrochanteric left femur (series 8, image 17). No definite postcontrast enhancement, although evaluation is limited given the lesions small size. Elsewhere, no marrow replacing bone lesion. Well corticated ossifications are again noted along the posterosuperior margin of the bilateral greater trochanters without focal marrow signal abnormality. Articular cartilage and labrum Articular cartilage: No chondral defect. No subchondral marrow signal changes. Labrum: Grossly intact, although evaluation is limited by lack of intra-articular fluid or contrast. Joint or bursal effusion Joint effusion:  None. Bursae: No abnormal bursal fluid collection. Muscles and tendons Muscles and tendons: Tendinosis with partial-thickness interstitial tearing of the bilateral hamstring tendon origins, right worse than left. Mild tendinosis of the bilateral gluteus medius tendons. The left gluteus minimus, iliopsoas, rectus femoris, and adductor tendons appear intact without tear or significant tendinosis. Normal muscle bulk and signal intensity without edema, atrophy, or fatty infiltration. Other findings Miscellaneous: No soft tissue edema or fluid collection. No solid or cystic mass within the soft tissues. No abnormal postcontrast enhancement. No acute findings are evident within the pelvis. IMPRESSION: 1. No acute osseous abnormality or significant arthropathy of the left hip. 2. Solitary nonspecific 6 mm lesion within the intertrochanteric left femur. No definite postcontrast enhancement, although evaluation is limited given the small size. Findings may reflect a small enchondroma or other benign fibro-osseous lesion. Metastatic disease is not entirely excluded. Follow-up MRI  in 3-6 months could be performed to assess stability. 3. Tendinosis  with partial-thickness interstitial tearing of the bilateral hamstring tendon origins, right worse than left. 4. Mild tendinosis of the bilateral gluteus medius tendons. Electronically Signed   By: Davina Poke D.O.   On: 11/09/2021 10:49   I, Lynne Leader, personally (independently) visualized and performed the interpretation of the images attached in this note.     Assessment and Plan: 50 y.o. female with left lateral hip pain due to hip abductor tendinopathy.  Patient does have some abnormality at the ischial tuberosity hamstring origin but does not have a lot of pain there so this is not thought to be her pain generator.  Plan to continue PT.  She had a steroid injection at the hip abductor about a month ago which helped some.  Plan to reassess as scheduled in about 1 month.  Could consider ultrasound-guided injection if needed.  She does have a small abnormalities seen at the intertrochanteric femur that will need follow-up in 3 to 6 months.  We will coordinate follow-up MRI with Wilber Bihari NP.   As for the muscle spasms and cramping at bedtime trial of baclofen.   PDMP not reviewed this encounter. Orders Placed This Encounter  Procedures   Ambulatory referral to Physical Therapy    Referral Priority:   Routine    Referral Type:   Physical Medicine    Referral Reason:   Specialty Services Required    Requested Specialty:   Physical Therapy    Number of Visits Requested:   1   Meds ordered this encounter  Medications   DISCONTD: gabapentin (NEURONTIN) 300 MG capsule    Sig: Take 1 capsule (300 mg total) by mouth at bedtime.    Dispense:  30 capsule    Refill:  2   baclofen (LIORESAL) 10 MG tablet    Sig: Take 1 tablet (10 mg total) by mouth at bedtime as needed for muscle spasms.    Dispense:  90 each    Refill:  1     Discussed warning signs or symptoms. Please see discharge instructions. Patient expresses understanding.   The above documentation has been reviewed  and is accurate and complete Lynne Leader, M.D.   Total encounter time 30 minutes including face-to-face time with the patient and, reviewing past medical record, and charting on the date of service.   Reviewed MRI findings and imaging discussed treatment plan and options.

## 2021-11-10 ENCOUNTER — Ambulatory Visit (INDEPENDENT_AMBULATORY_CARE_PROVIDER_SITE_OTHER): Payer: Medicaid Other | Admitting: Family Medicine

## 2021-11-10 ENCOUNTER — Other Ambulatory Visit: Payer: Self-pay

## 2021-11-10 VITALS — BP 138/86 | HR 86 | Ht 63.0 in | Wt 190.8 lb

## 2021-11-10 DIAGNOSIS — R252 Cramp and spasm: Secondary | ICD-10-CM | POA: Insufficient documentation

## 2021-11-10 DIAGNOSIS — M7062 Trochanteric bursitis, left hip: Secondary | ICD-10-CM | POA: Diagnosis not present

## 2021-11-10 DIAGNOSIS — M25552 Pain in left hip: Secondary | ICD-10-CM

## 2021-11-10 MED ORDER — BACLOFEN 10 MG PO TABS: 10.0000 mg | ORAL_TABLET | Freq: Every evening | ORAL | 1 refills | Status: DC | PRN

## 2021-11-10 MED ORDER — GABAPENTIN 300 MG PO CAPS: 300.0000 mg | ORAL_CAPSULE | Freq: Every day | ORAL | 2 refills | Status: DC

## 2021-11-10 NOTE — Patient Instructions (Addendum)
Thank you for coming in today.   Con't PT.  Baclofen at bedtime for cramps.  Follow-up in one month as already scheduled.

## 2021-11-16 ENCOUNTER — Other Ambulatory Visit: Payer: Self-pay | Admitting: *Deleted

## 2021-11-16 DIAGNOSIS — C50411 Malignant neoplasm of upper-outer quadrant of right female breast: Secondary | ICD-10-CM

## 2021-11-16 NOTE — Progress Notes (Signed)
Patient Care Team: Muse, Noel Journey., PA-C as PCP - General Nicholas Lose, MD as Consulting Physician (Hematology and Oncology) Eppie Gibson, MD as Attending Physician (Radiation Oncology) Erroll Luna, MD as Consulting Physician (General Surgery) Mauro Kaufmann, RN as Oncology Nurse Navigator Rockwell Germany, RN as Oncology Nurse Navigator  DIAGNOSIS:    ICD-10-CM   1. Primary malignant neoplasm of upper outer quadrant of female breast, right ([place])  C50.411       SUMMARY OF ONCOLOGIC HISTORY: Oncology History  Primary malignant neoplasm of upper outer quadrant of female breast, right ([place])  08/12/2020 Initial Diagnosis   Screening mammogram on 07/07/20 showed an asymmetry. Diagnostic mammogram and [place] on 07/29/20 showed a 0.8cm mass at the 10 o'clock position and no right axillary adenopathy. Biopsy on 08/12/20 showed invasive and in situ mammary carcinoma, grade 2, HER-2 equivocal by IHC, positive by FISH, ER+ 80%, PR+ 60%, Ki67 15%.   08/26/2020 Cancer Staging   Staging form: Breast, AJCC 8th Edition - Clinical stage from 08/26/2020: Stage IA (cT1b, cN0, cM0, G2, ER+, PR+, HER2+) - Signed by Eppie Gibson, MD on 08/27/2020    09/10/2020 Surgery   Right lumpectomy ([facility]): invasive lobular carcinoma, 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  Variants of uncertain significance detected in ATM (c.131A>G (p.Asp44Gly) and c.4658A>C (p.Glu1553Ala)) and WRN (c.2825+6G>T (intronic)).  The report date is October 02, 2020.   The Multi-Cancer Panel offered by [facility] includes sequencing and/or deletion duplication testing of the following 85 genes:  AIP, ALK, APC, ATM, AXIN2,BAP1,  BARD1, BLM, BMPR1A, BRCA1, BRCA2, BRIP1, CASR, CDC73, CDH1, CDK4, CDKN1B, CDKN1C, CDKN2A (p14ARF), CDKN2A (p16INK4a), CEBPA, CHEK2, CTNNA1, DICER1, DIS3L2, EGFR (c.2369C>T, p.Thr790Met variant only), EPCAM (Deletion/duplication testing only), FH, FLCN, GATA2, GPC3, GREM1 (Promoter region deletion/duplication testing only), HOXB13 (c.251G>A, p.Gly84Glu), HRAS, KIT, MAX, MEN1, MET, MITF (c.952G>A, p.Glu318Lys variant only), MLH1, MSH2, MSH3, MSH6, MUTYH, NBN, NF1, NF2, NTHL1, PALB2, PDGFRA, PHOX2B, PMS2, POLD1, POLE, POT1, PRKAR1A, PTCH1, PTEN, RAD50, RAD51C, RAD51D, RB1, RECQL4, RET, RNF43, RUNX1, SDHAF2, SDHA (sequence changes only), SDHB, SDHC, SDHD, SMAD4, SMARCA4, SMARCB1, SMARCE1, STK11, SUFU, TERC, TERT, TMEM127, TP53, TSC1, TSC2, VHL, WRN and WT1.   Amended report: The variant of uncertain significance (VUS) in [place] at  c.2825+6G>T (Intronic) has been reclassified to likely benign.  The change in variant classification was made as a result of re-review of evidence in light of new variant interpretation guidelines and/or new information. The amended report date is December 18, 2020.    02/10/2021 -  Radiation Therapy   Adjuvant radiation   04/13/2021 -  Anti-estrogen oral therapy   Initially anastrozole switched to letrozole for fatigue issues     CHIEF COMPLIANT: Follow-up after recent MRI  INTERVAL HISTORY: Monica [name] is a 50 y.o. with above-mentioned history of right breast cancer underwent a right lumpectomy followed by re-excision, adjuvant chemotherapy, and radiation, completed Herceptin maintenance therapy.  She had intractable pain in the left hip and had a hip MRI.  She has seen orthopedics as well.  There is no evidence of metastatic disease and therefore the recommended watchful monitoring.  She has been set up with physical therapy.  ALLERGIES:  has  No Known Allergies.  MEDICATIONS:  Current Outpatient Medications  Medication Sig Dispense  Refill   albuterol (VENTOLIN HFA) 108 (90 Base) MCG/ACT inhaler Inhale 2 puffs into the lungs every 4 (four) hours as needed for wheezing or shortness of breath. 8 g 3   baclofen (LIORESAL) 10 MG tablet Take 1 tablet (10 mg total) by mouth at bedtime as needed for muscle spasms. 90 each 1   Cholecalciferol (VITAMIN D3) 125 MCG (5000 UT) TABS Take 1 tablet by mouth daily.     escitalopram (LEXAPRO) 5 MG tablet Take 5 mg by mouth daily.     HYDROcodone-acetaminophen (NORCO/VICODIN) 5-325 MG tablet Take 1 tablet by mouth every 6 (six) hours as needed. 15 tablet 0   lidocaine-prilocaine (EMLA) cream APPLY TO AFFECTED AREA ONCE. 30 g 0   lisinopril (ZESTRIL) 10 MG tablet Take 1 tablet (10 mg total) by mouth daily.     LORazepam (ATIVAN) 0.5 MG tablet Take 0.5 mg by mouth every 8 (eight) hours. As needed     ondansetron (ZOFRAN) 8 MG tablet Take 1 tablet (8 mg total) by mouth 2 (two) times daily as needed (Nausea or vomiting). 30 tablet 1   prazosin (MINIPRESS) 1 MG capsule Take 1 mg by mouth at bedtime.     No current facility-administered medications for this visit.    PHYSICAL EXAMINATION: ECOG PERFORMANCE STATUS: 1 - Symptomatic but completely ambulatory  Vitals:   11/17/21 0952  BP: 122/78  Pulse: 70  Resp: 18  Temp: (!) 97.3 F (36.3 C)  SpO2: 99%   Filed Weights   11/17/21 0952  Weight: 190 lb 14.4 oz (86.6 kg)    LABORATORY DATA:  I have reviewed the data as listed CMP Latest Ref Rng & Units 11/17/2021 09/28/2021 07/06/2021  Glucose 70 - 99 mg/dL 87 94 108(H)  BUN 6 - 20 mg/dL _0 Creatinine 0.44 - 1.00 mg/dL 0.94 0.67 1.05(H)  Sodium 135 - 145 mmol/L 141 137 139  Potassium 3.5 - 5.1 mmol/L 3.6 3.8 3.8  Chloride 98 - 111 mmol/L 107 107 108  CO2 22 - 32 mmol/L _1 Calcium 8.9 - 10.3 mg/dL 9.2 8.8(L) 9.2  Total Protein 6.5 - 8.1 g/dL 7.1 7.1 6.9  Total Bilirubin 0.3 - 1.2 mg/dL 0.5 0.4 0.6  Alkaline Phos 38 - 126 U/L 110 102 102  AST 15 - 41 U/L 29 34 32  ALT 0  - 44 U/L _2 Lab Results  Component Value Date   WBC 6.4 11/17/2021   HGB 13.2 11/17/2021   HCT 37.9 11/17/2021   MCV 79.6 (L) 11/17/2021   PLT 231 11/17/2021   NEUTROABS 4.4 11/17/2021    ASSESSMENT & PLAN:  Primary malignant neoplasm of upper outer quadrant of female breast, right ([place]) 09/10/2020:Right lumpectomy ([facility]): invasive lobular carcinoma, 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  ER 80%, PR 60%, Ki-67 15%, HER-2 equivocal by IHC positive for fish 10/11/20: Re-excision: Neg   Recommendation: 1. Adjuvant chemotherapy with Taxol Herceptin followed by Herceptin maintenance for 1 year 2. Adjuvant radiation therapy 02/10/21- 03/24/21 3. Adjuvant antiestrogen therapy with anastrozole (patient is postmenopausal) started 04/13/2021 and anti-HER-2 therapy with neratinib -------------------------------------------------------------------------------------------------------------------------------- Current treatment: Herceptin maintenance q 3 weeks with anastrozole, switched to letrozole 07/06/2021 Herceptin maintenance will continue till November 2022 Echocardiogram on October 06, 2021 shows an EF of 60 to 65%   Letrozole Toxicities:  We are holding the letrozole right now due to the left hip pain.  She will resume letrozole today.   Left lateral leg numbness/pain: 09/23/2021: Hip x-ray: Negative, treated with Medrol dose pack without any relief.    She was seen by Dr. Georgina Snell and received a cortisone injection on October 09, 2021. MRI left hip 11/09/2021: No acute osseous abnormality.  Solitary nonspecific 6 mm lesion intertrochanteric left femur (likely benign) tendinosis with partial-thickness interstitial tearing of the bilateral hamstring tendon origins right greater than left.  A follow-up MRI in 3 to 6 months was suggested  We will plan to obtain CT chest abdomen pelvis in 3 months and follow-up after that.     No  orders of the defined types were placed in this encounter.  The patient has a good understanding of the overall plan. she agrees with it. she will call with any problems that may develop before the next visit here.  Total time spent: 20 mins including face to face time and time spent for planning, charting and coordination of care  Rulon Eisenmenger, MD, MPH 11/17/2021  I, Thana Ates, am acting as scribe for Dr. Nicholas Lose.  I have reviewed the above documentation for accuracy and completeness, and I agree with the above.

## 2021-11-16 NOTE — Assessment & Plan Note (Signed)
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 Adjuvant radiation therapy3/8/22-4/19/22 3. Adjuvant antiestrogen therapywith anastrozole (patient is postmenopausal)started 04/13/2021 and anti-HER-2 therapy with neratinib -------------------------------------------------------------------------------------------------------------------------------- Current treatment:Herceptin maintenance q 3 weekswith anastrozole, switched to letrozole 8/1/2022Herceptin maintenance will continue till November 2022 Echocardiogram on October 06, 2021 shows an EF of 60 to 65%  LetrozoleToxicities: We are holding the letrozole right now due to the left hip pain.  Left lateral leg numbness/pain:09/23/2021: Hip x-ray: Negative, treated with Medrol dose packwithout any relief.   She was seen by Dr. Georgina Snell and received a cortisone injection on October 09, 2021. MRI left hip 11/09/2021: No acute osseous abnormality.  Solitary nonspecific 6 mm lesion intertrochanteric left femur (likely benign) tendinosis with partial-thickness interstitial tearing of the bilateral hamstring tendon origins right greater than left.  A follow-up MRI in 3 to 6 months was suggested  Continue with Herceptin maintenance therapy.  We can hold letrozole until relief of her left hip symptoms.

## 2021-11-17 ENCOUNTER — Inpatient Hospital Stay: Payer: Medicaid Other

## 2021-11-17 ENCOUNTER — Inpatient Hospital Stay: Payer: Medicaid Other | Attending: Hematology and Oncology | Admitting: Hematology and Oncology

## 2021-11-17 ENCOUNTER — Other Ambulatory Visit: Payer: Self-pay

## 2021-11-17 DIAGNOSIS — Z170 Estrogen receptor positive status [ER+]: Secondary | ICD-10-CM | POA: Diagnosis not present

## 2021-11-17 DIAGNOSIS — M25552 Pain in left hip: Secondary | ICD-10-CM | POA: Insufficient documentation

## 2021-11-17 DIAGNOSIS — C50411 Malignant neoplasm of upper-outer quadrant of right female breast: Secondary | ICD-10-CM | POA: Insufficient documentation

## 2021-11-17 DIAGNOSIS — C773 Secondary and unspecified malignant neoplasm of axilla and upper limb lymph nodes: Secondary | ICD-10-CM | POA: Insufficient documentation

## 2021-11-17 LAB — CBC WITH DIFFERENTIAL (CANCER CENTER ONLY)
Abs Immature Granulocytes: 0.02 10*3/uL (ref 0.00–0.07)
Basophils Absolute: 0 10*3/uL (ref 0.0–0.1)
Basophils Relative: 0 %
Eosinophils Absolute: 0.1 10*3/uL (ref 0.0–0.5)
Eosinophils Relative: 1 %
HCT: 37.9 % (ref 36.0–46.0)
Hemoglobin: 13.2 g/dL (ref 12.0–15.0)
Immature Granulocytes: 0 %
Lymphocytes Relative: 20 %
Lymphs Abs: 1.3 10*3/uL (ref 0.7–4.0)
MCH: 27.7 pg (ref 26.0–34.0)
MCHC: 34.8 g/dL (ref 30.0–36.0)
MCV: 79.6 fL — ABNORMAL LOW (ref 80.0–100.0)
Monocytes Absolute: 0.6 10*3/uL (ref 0.1–1.0)
Monocytes Relative: 9 %
Neutro Abs: 4.4 10*3/uL (ref 1.7–7.7)
Neutrophils Relative %: 70 %
Platelet Count: 231 10*3/uL (ref 150–400)
RBC: 4.76 MIL/uL (ref 3.87–5.11)
RDW: 13.9 % (ref 11.5–15.5)
WBC Count: 6.4 10*3/uL (ref 4.0–10.5)
nRBC: 0 % (ref 0.0–0.2)

## 2021-11-17 LAB — CMP (CANCER CENTER ONLY)
ALT: 25 U/L (ref 0–44)
AST: 29 U/L (ref 15–41)
Albumin: 3.7 g/dL (ref 3.5–5.0)
Alkaline Phosphatase: 110 U/L (ref 38–126)
Anion gap: 8 (ref 5–15)
BUN: 7 mg/dL (ref 6–20)
CO2: 26 mmol/L (ref 22–32)
Calcium: 9.2 mg/dL (ref 8.9–10.3)
Chloride: 107 mmol/L (ref 98–111)
Creatinine: 0.94 mg/dL (ref 0.44–1.00)
GFR, Estimated: >60 mL/min (ref >60)
Glucose, Bld: 87 mg/dL (ref 70–99)
Potassium: 3.6 mmol/L (ref 3.5–5.1)
Sodium: 141 mmol/L (ref 135–145)
Total Bilirubin: 0.5 mg/dL (ref 0.3–1.2)
Total Protein: 7.1 g/dL (ref 6.5–8.1)

## 2021-11-19 ENCOUNTER — Ambulatory Visit (HOSPITAL_COMMUNITY): Payer: Medicaid Other | Admitting: Physical Therapy

## 2021-12-09 ENCOUNTER — Ambulatory Visit: Payer: Medicaid Other | Admitting: Family Medicine

## 2021-12-10 NOTE — Progress Notes (Signed)
I, Wendy Poet, LAT, ATC, am serving as scribe for Dr. Lynne Leader.  Monica [name] is a 51 y.o. female who presents to [place] at [facility] today for f/u of L hip pain primarily due to greater trochanteric bursitis and hip abductor tendinopathy.  She was last seen by Dr. Georgina Snell on 11/10/21 and noted no improvement in her symptoms despite a L GT injection on 10/09/21 and having completed a course of PT.  She was advised to con't her HEP/PT and was prescribed Baclofen.  Today, pt reports no change in her symptoms and locates her pain to her L post-lat hip that radiates into her L lateral leg all the way down to her L lateral ankle and foot.  She also reports numbness in her L leg if she sits or stands too long.  She notes that she will be resuming PT on Monday, Dec 14, 2021.  Dx imaging: L hip MRI- 11/07/21;  L hip XR- 09/22/21  Pertinent review of systems: No fevers or chills  Relevant historical information: Chest cancer   Exam:  BP (!) 150/98 (BP Location: Right Arm, Patient Position: Sitting, Cuff Size: Normal)    Pulse 71    Ht 5\' 3"  (1.6 m)    Wt 192 lb 9.6 oz (87.4 kg)    SpO2 94%    BMI 34.12 kg/m  General: Well Developed, well nourished, and in no acute distress.   MSK: L-spine nontender midline decreased lumbar motion. Mildly positive right-sided slump test. Lower extremity strength decreased hip abduction otherwise intact.    Lab and Radiology Results  X-ray images L-spine obtained today personally and independently interpreted Probable spondylolisthesis grade 1 at L5-S1.  DDD L5-S1.  No acute fractures.  No aggressive appearing bony lesions. Await formal radiology review   EXAM: MRI OF THE LEFT HIP WITHOUT AND WITH CONTRAST   TECHNIQUE: Multiplanar, multisequence MR imaging was performed both before and after administration of intravenous contrast.   CONTRAST:  22mL GADAVIST GADOBUTROL 1 MMOL/ML IV SOLN   COMPARISON:  X-ray 09/22/2021    FINDINGS: Bones: No acute fracture. No dislocation. No femoral head avascular necrosis. Bony pelvis intact without diastasis. Mild arthropathy of the SI joints and pubic symphysis. No bone marrow edema. Nonspecific 6 mm T1 hypointense/T2 hyperintense lesion within the intertrochanteric left femur (series 8, image 17). No definite postcontrast enhancement, although evaluation is limited given the lesions small size. Elsewhere, no marrow replacing bone lesion. Well corticated ossifications are again noted along the posterosuperior margin of the bilateral greater trochanters without focal marrow signal abnormality.   Articular cartilage and labrum   Articular cartilage: No chondral defect. No subchondral marrow signal changes.   Labrum: Grossly intact, although evaluation is limited by lack of intra-articular fluid or contrast.   Joint or bursal effusion   Joint effusion:  None.   Bursae: No abnormal bursal fluid collection.   Muscles and tendons   Muscles and tendons: Tendinosis with partial-thickness interstitial tearing of the bilateral hamstring tendon origins, right worse than left. Mild tendinosis of the bilateral gluteus medius tendons. The left gluteus minimus, iliopsoas, rectus femoris, and adductor tendons appear intact without tear or significant tendinosis. Normal muscle bulk and signal intensity without edema, atrophy, or fatty infiltration.   Other findings   Miscellaneous: No soft tissue edema or fluid collection. No solid or cystic mass within the soft tissues. No abnormal postcontrast enhancement. No acute findings are evident within the pelvis.   IMPRESSION: 1. No acute  osseous abnormality or significant arthropathy of the left hip. 2. Solitary nonspecific 6 mm lesion within the intertrochanteric left femur. No definite postcontrast enhancement, although evaluation is limited given the small size. Findings may reflect a small enchondroma or other  benign fibro-osseous lesion. Metastatic disease is not entirely excluded. Follow-up MRI in 3-6 months could be performed to assess stability. 3. Tendinosis with partial-thickness interstitial tearing of the bilateral hamstring tendon origins, right worse than left. 4. Mild tendinosis of the bilateral gluteus medius tendons.     Electronically Signed   By: Davina Poke D.O.   On: 11/09/2021 10:49  I, Lynne Leader, personally (independently) visualized and performed the interpretation of the images attached in this note.    Assessment and Plan: 51 y.o. female with left lateral leg pain now radiating past the level of the knee to the lateral calf and foot concerning for L5 radiculopathy.  Patient has failed conservative management strategies including injection for greater trochanteric bursitis and hip abductor tendinopathy.  Her new symptoms today are much more consistent with radiculopathy.  Plan to pursue this evaluation with x-ray and MRI.  Given her cancer history we will proceed to MRI much faster than otherwise would with MRI L-spine with and without contrast.  As noted above x-ray today concerning for spondylolisthesis which does support diagnosis of radiculopathy.  Prescribed prednisone and gabapentin.  Recheck after MRI.   PDMP not reviewed this encounter. Orders Placed This Encounter  Procedures   DG Lumbar Spine 2-3 Views    Standing Status:   Future    Number of Occurrences:   1    Standing Expiration Date:   01/11/2022    Order Specific Question:   Reason for Exam (SYMPTOM  OR DIAGNOSIS REQUIRED)    Answer:   L leg pain    Order Specific Question:   Is patient pregnant?    Answer:   No    Order Specific Question:   Preferred imaging location?    Answer:   [facility]   MR Lumbar Spine W Wo Contrast    Standing Status:   Future    Standing Expiration Date:   12/11/2022    Order Specific Question:   If indicated for the ordered procedure, I authorize the  administration of contrast media per Radiology protocol    Answer:   Yes    Order Specific Question:   What is the patient's sedation requirement?    Answer:   No Sedation    Order Specific Question:   Does the patient have a pacemaker or implanted devices?    Answer:   No    Order Specific Question:   Preferred imaging location?    Answer:   [facility] (table limit-550lbs)   Meds ordered this encounter  Medications   gabapentin (NEURONTIN) 300 MG capsule    Sig: Take 1 capsule (300 mg total) by mouth 3 (three) times daily.    Dispense:  90 capsule    Refill:  2   predniSONE (DELTASONE) 50 MG tablet    Sig: Take 1 tablet (50 mg total) by mouth daily with breakfast for 5 days.    Dispense:  5 tablet    Refill:  0     Discussed warning signs or symptoms. Please see discharge instructions. Patient expresses understanding.   The above documentation has been reviewed and is accurate and complete Lynne Leader, M.D.

## 2021-12-11 ENCOUNTER — Telehealth: Payer: Self-pay | Admitting: Adult Health

## 2021-12-11 ENCOUNTER — Ambulatory Visit (INDEPENDENT_AMBULATORY_CARE_PROVIDER_SITE_OTHER): Payer: Medicaid Other

## 2021-12-11 ENCOUNTER — Encounter: Payer: Self-pay | Admitting: Family Medicine

## 2021-12-11 ENCOUNTER — Other Ambulatory Visit: Payer: Self-pay

## 2021-12-11 ENCOUNTER — Ambulatory Visit (INDEPENDENT_AMBULATORY_CARE_PROVIDER_SITE_OTHER): Payer: Medicaid Other | Admitting: Family Medicine

## 2021-12-11 VITALS — BP 150/98 | HR 71 | Ht 63.0 in | Wt 192.6 lb

## 2021-12-11 DIAGNOSIS — M5416 Radiculopathy, lumbar region: Secondary | ICD-10-CM

## 2021-12-11 DIAGNOSIS — M79605 Pain in left leg: Secondary | ICD-10-CM | POA: Diagnosis not present

## 2021-12-11 DIAGNOSIS — C50411 Malignant neoplasm of upper-outer quadrant of right female breast: Secondary | ICD-10-CM

## 2021-12-11 IMAGING — DX DG LUMBAR SPINE 2-3V
3 series · 3 of 3 positions shown · non-contrast
Comparison: None.

CLINICAL DATA: Left leg and hip pain.  No known injury.

EXAM:
LUMBAR SPINE - 2-3 VIEW

[l-spine ap]
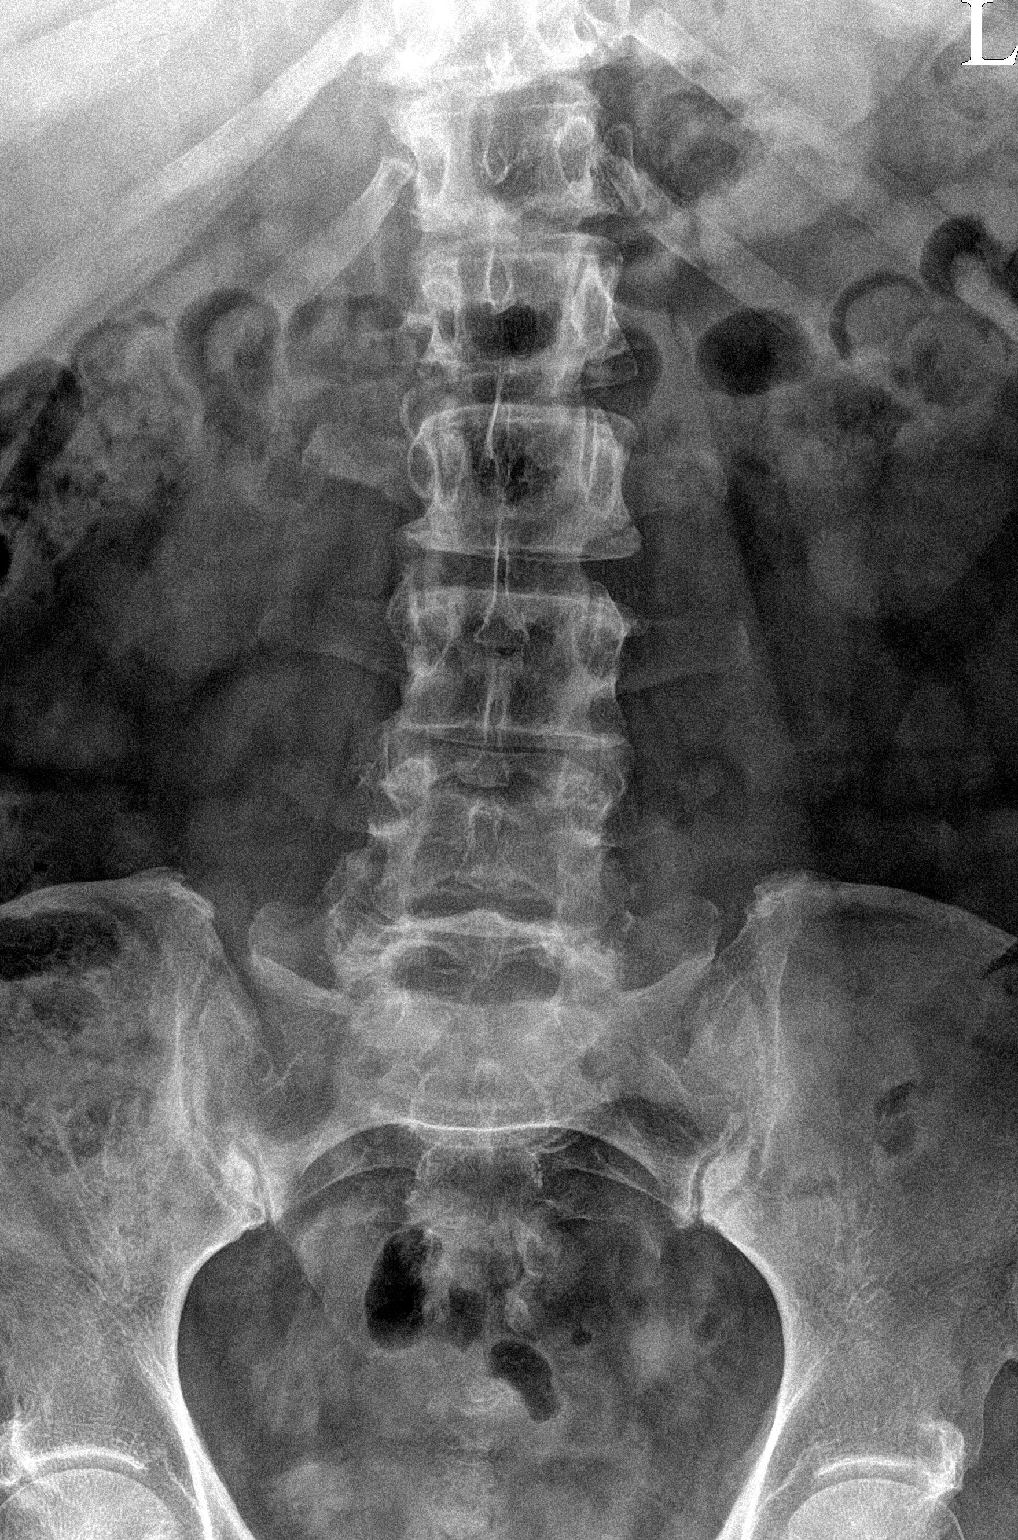

[l-spine lateral]
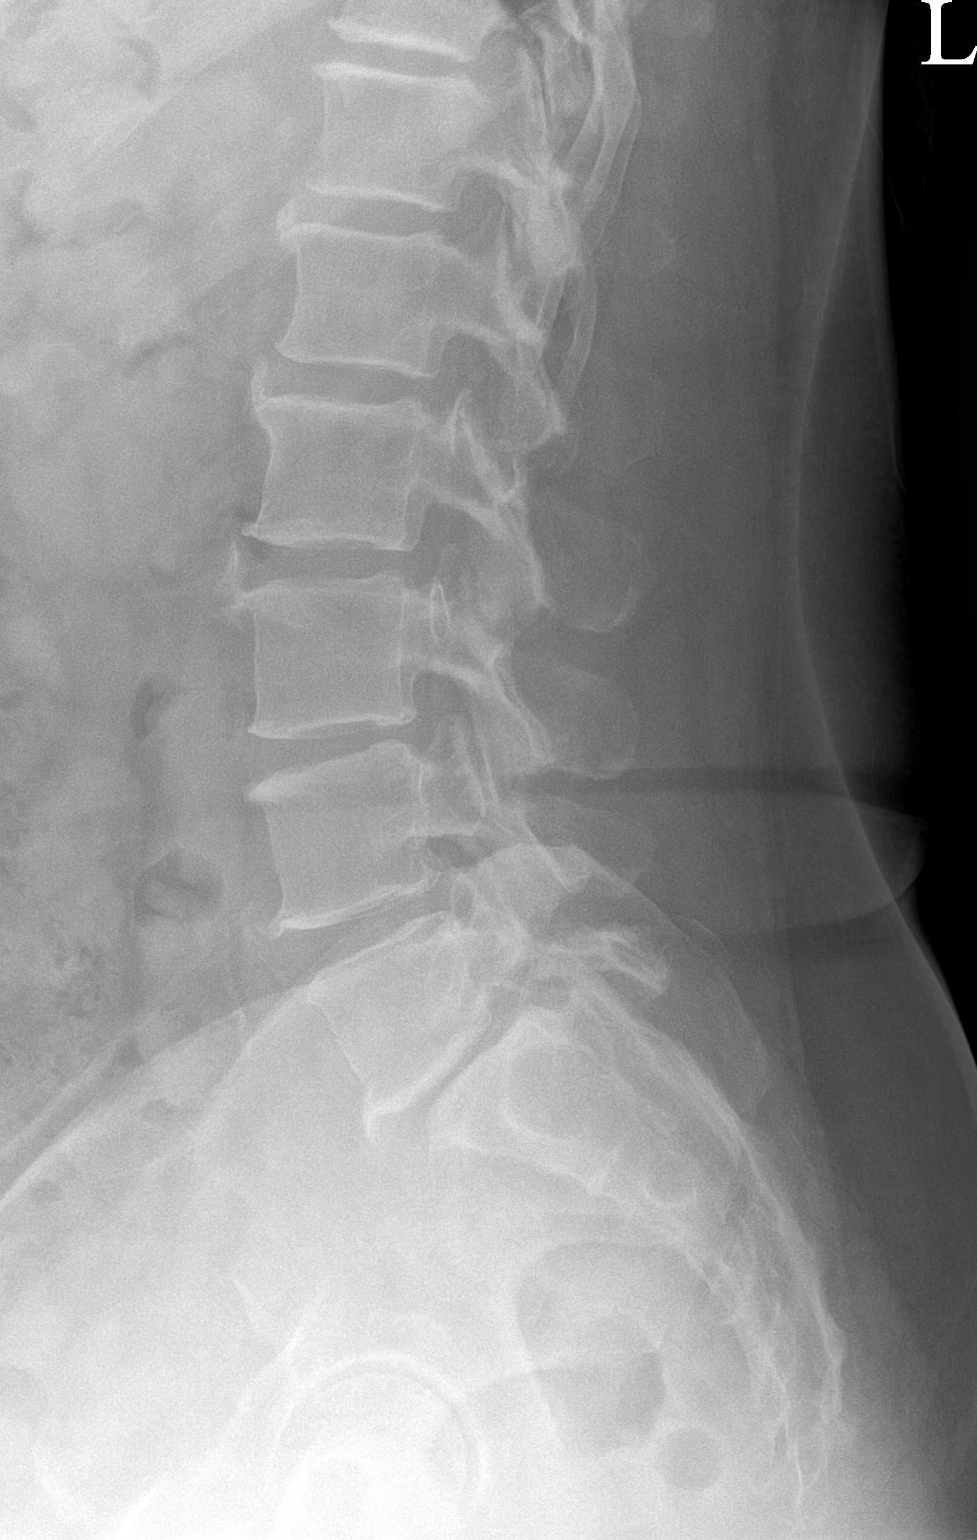

[l-spine spot]
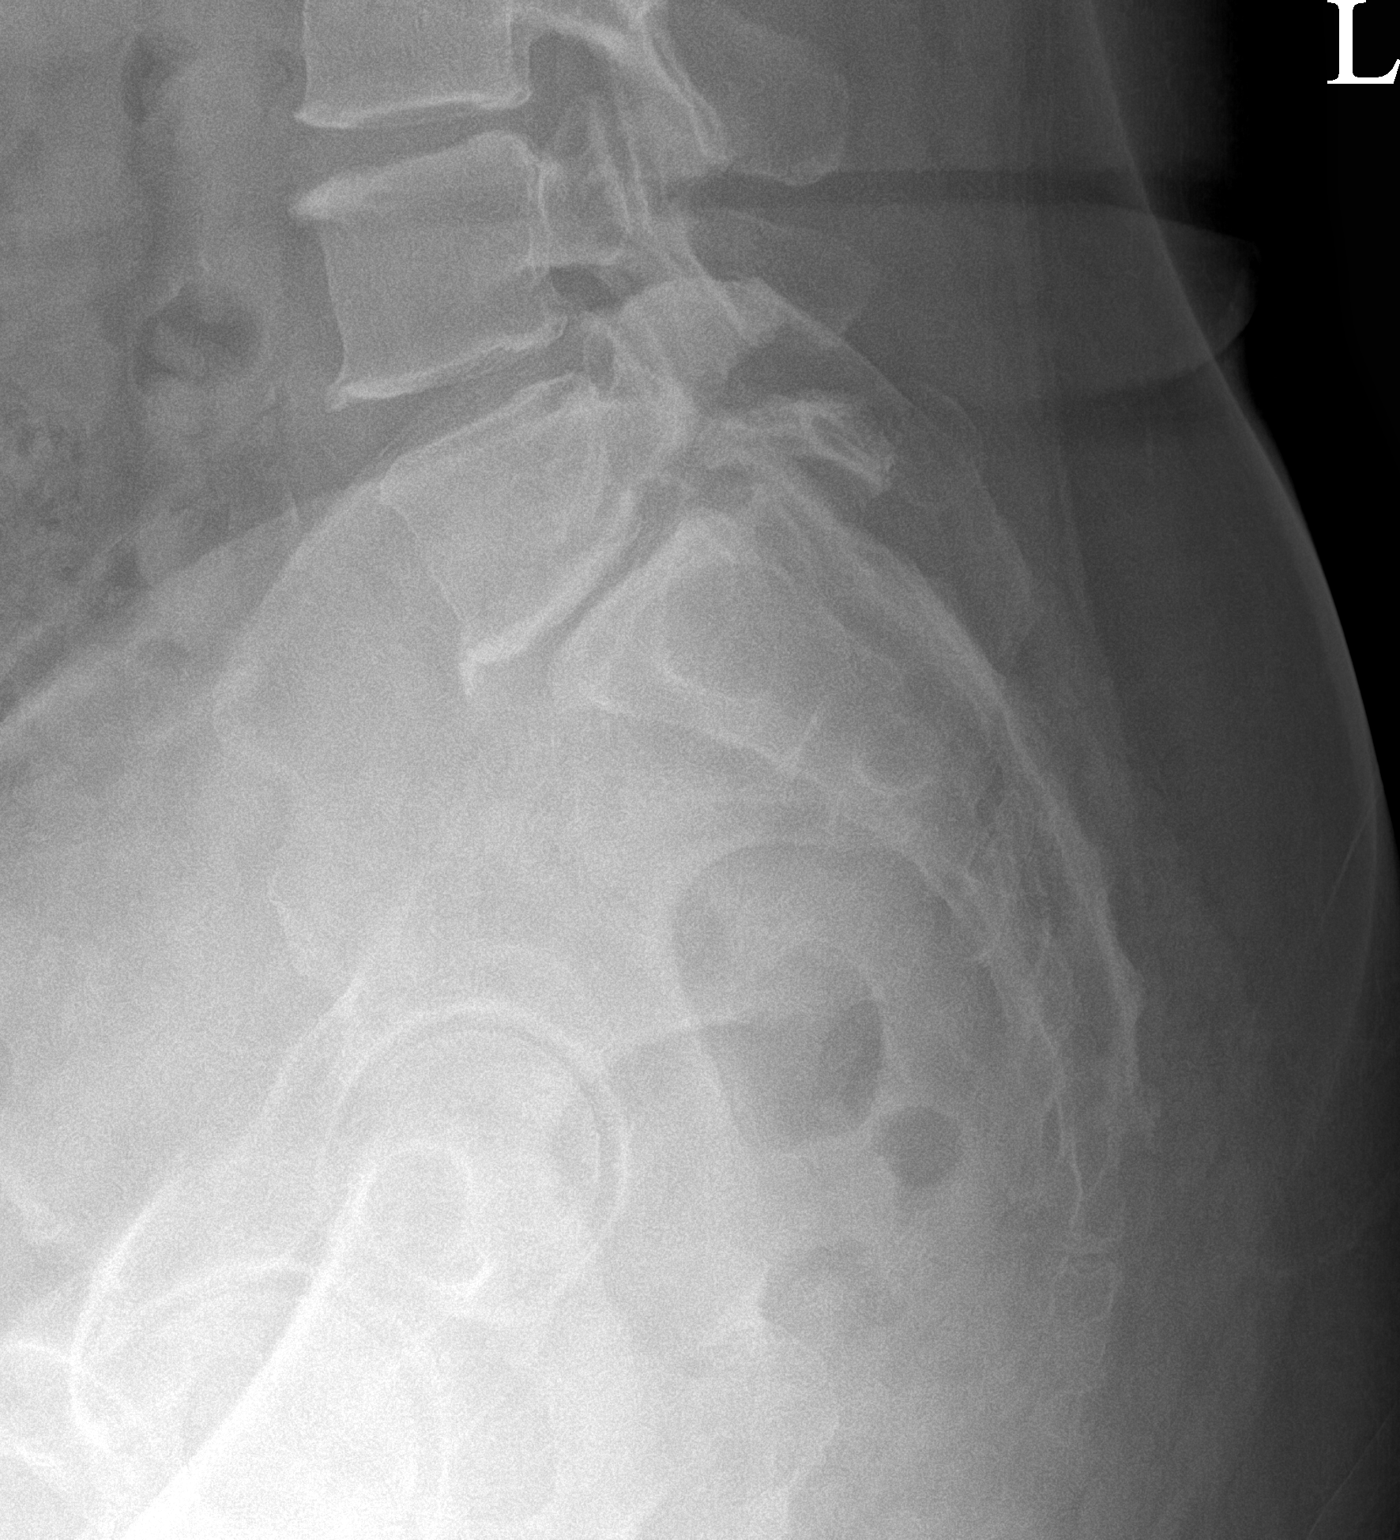

[3 of 3 positions shown; findings below may reference images not displayed]

FINDINGS: There are 5 non rib-bearing lumbar type vertebral bodies.

Mild scoliotic curvature of the thoracolumbar spine with caudal
component convex the left measuring approximately 11 degrees (as
measured from the superior endplate of T12 to the inferior endplate
of L4). Suspected bilateral L5 pars defects with associated grade 1
anterolisthesis L5 upon S1 measuring approximately 8 mm. No
retrolisthesis.

Lumbar vertebral body heights appear preserved.

Mild to moderate multilevel lumbar spine DDD, worse at L5-S1 and to
a lesser extent, L3-L4, with disc space height loss, endplate
irregularity and sclerosis.

Limited visualization of the bilateral SI joints is normal.

Regional bowel gas pattern and soft tissues appear normal.
Atherosclerotic plaque within the abdominal aorta.
IMPRESSION: 1. No acute findings.
2. Suspected bilateral L5 pars defects with associated grade 1
anterolisthesis of L5 upon S1.
3. Mild-to-moderate multilevel lumbar spine DDD, worse at L5-S1.

## 2021-12-11 MED ORDER — GABAPENTIN 300 MG PO CAPS: 300.0000 mg | ORAL_CAPSULE | Freq: Three times a day (TID) | ORAL | 2 refills | Status: DC

## 2021-12-11 MED ORDER — PREDNISONE 50 MG PO TABS: 50.0000 mg | ORAL_TABLET | Freq: Every day | ORAL | 0 refills | Status: AC

## 2021-12-11 NOTE — Telephone Encounter (Signed)
Called patient about scheduling her survivorship care plan appointment.  She would like to proceed with this as an in person visit on February 1 at 815.  I sent a scheduling message to that effect.  She asked about her imaging results from her x-rays that she had completed with Dr. Bertram Millard earlier today.  I reviewed with her these results and let her know that we would have more information in detail with the MRI that he ordered.  Wilber Bihari, NP 12/11/21 3:35 PM Medical Oncology and Hematology [facility] [place], [place] [address] Tel. [phone]    Fax. [phone]

## 2021-12-11 NOTE — Patient Instructions (Addendum)
Good to see you today.  Con't w/ plan to do PT.  Please get an Xray today before you leave.  I've ordered an L-spine MRI.  [facility] will call you to schedule but please let us know if you haven't heard from them within a week regarding scheduling.    I've prescribed Gabapentin and prednisone  Follow-up: after MRI

## 2021-12-14 ENCOUNTER — Ambulatory Visit (HOSPITAL_COMMUNITY): Payer: Medicaid Other | Attending: Hematology and Oncology | Admitting: Physical Therapy

## 2021-12-14 ENCOUNTER — Other Ambulatory Visit: Payer: Self-pay

## 2021-12-14 ENCOUNTER — Encounter (HOSPITAL_COMMUNITY): Payer: Self-pay | Admitting: Physical Therapy

## 2021-12-14 DIAGNOSIS — R262 Difficulty in walking, not elsewhere classified: Secondary | ICD-10-CM | POA: Insufficient documentation

## 2021-12-14 DIAGNOSIS — M6281 Muscle weakness (generalized): Secondary | ICD-10-CM | POA: Insufficient documentation

## 2021-12-14 DIAGNOSIS — M25552 Pain in left hip: Secondary | ICD-10-CM | POA: Insufficient documentation

## 2021-12-14 DIAGNOSIS — M545 Low back pain, unspecified: Secondary | ICD-10-CM | POA: Diagnosis present

## 2021-12-14 DIAGNOSIS — M7062 Trochanteric bursitis, left hip: Secondary | ICD-10-CM | POA: Insufficient documentation

## 2021-12-14 NOTE — Therapy (Signed)
[facility] [address] [place], [place], [address] Phone: [phone]   Fax:  [phone]  Physical Therapy Evaluation  Patient Details  Name: Monica [name] MRN: [id number] Date of Birth: [birth year] Referring Provider (PT): Lynne Leader MD   Encounter Date: 12/14/2021   PT End of Session - 12/14/21 1004     Visit Number 1    Number of Visits 12    Date for PT Re-Evaluation 01/25/22    Authorization Type [place] [employer]    Authorization Time Period Submitted 12 visits 12/14/21, check auth    Progress Note Due on Visit 10    PT Start Time [phone]    PT Stop Time 1020    PT Time Calculation (min) 37 min    Activity Tolerance Patient limited by pain    Behavior During Therapy [facility] for tasks assessed/performed             Past Medical History:  Diagnosis Date   Asthma    Breast cancer ([place])    Family history of breast cancer 09/22/2020   Hypertension    Personal history of chemotherapy    Personal history of radiation therapy     Past Surgical History:  Procedure Laterality Date   ABDOMINAL HYSTERECTOMY     APPENDECTOMY     BREAST LUMPECTOMY WITH RADIOACTIVE SEED AND SENTINEL LYMPH NODE BIOPSY Right 09/10/2020   Procedure: RIGHT BREAST LUMPECTOMY WITH RADIOACTIVE SEED AND SENTINEL [place];  Surgeon: Erroll Luna, MD;  Location: [place];  Service: General;  Laterality: Right;   PORTACATH PLACEMENT Right 10/16/2020   Procedure: INSERTION PORT-A-CATH WITH ULTRASOUND GUIDANCE;  Surgeon: Erroll Luna, MD;  Location: [place];  Service: General;  Laterality: Right;   RE-EXCISION OF BREAST LUMPECTOMY Right 10/16/2020   Procedure: RE-EXCISION OF RIGHT BREAST LUMPECTOMY;  Surgeon: Erroll Luna, MD;  Location: [place];  Service: General;  Laterality: Right;    There were no vitals filed for this visit.    Subjective Assessment - 12/14/21 0949      Subjective Patient presents to therapy with complaint of lumbar pain with radiculopathy. This has been ongoing for about a year. She had injections but they did not help. She is taking medication for this now. She had an x-ray which showed degeneration of lumbar spine. Radiculopathy is constant through LLE.    Limitations Sitting;Lifting;Standing;Walking;House hold activities    Patient Stated Goals Decrease pain    Currently in Pain? Yes    Pain Score 10-Worst pain ever    Pain Location Back    Pain Orientation Posterior;Lower;Left    Pain Descriptors / Indicators Numbness;Sharp    Pain Type Chronic pain    Pain Radiating Towards LT foot    Pain Onset More than a month ago    Pain Frequency Constant    Aggravating Factors  sitting, standing, walking    Pain Relieving Factors meds    Effect of Pain on Daily Activities Limits                [facility] PT Assessment - 12/14/21 0001       Assessment   Medical Diagnosis LT hip pain/ bursitis    Referring Provider (PT) Lynne Leader MD    Prior Therapy Yes      Precautions   Precautions None      Restrictions   Weight Bearing Restrictions No      Balance Screen  Has the patient fallen in the past 6 months No      Prior Function   Level of Independence Independent      Cognition   Overall Cognitive Status Within Functional Limits for tasks assessed      ROM / Strength   AROM / PROM / Strength AROM;Strength      AROM   AROM Assessment Site Lumbar    Lumbar Flexion 60% limited    Lumbar Extension 50% limited    Lumbar - Right Side Bend 25% limited    Lumbar - Left Side Bend 25% limited      Strength   Strength Assessment Site Hip;Knee;Ankle    Right/Left Hip Right;Left    Right Hip Flexion 4/5    Right Hip Extension 3/5    Right Hip ABduction 3-/5    Left Hip Flexion 3-/5    Left Hip Extension 3-/5    Left Hip ABduction 3-/5    Right/Left Knee Right;Left    Right Knee Extension 4-/5    Left Knee Extension 3-/5     Right/Left Ankle Right;Left    Right Ankle Dorsiflexion 3+/5    Left Ankle Dorsiflexion 3-/5      Palpation   Palpation comment Mod TTP about LT  superior glute and lateral hip      Ambulation/Gait   Ambulation/Gait Yes    Ambulation/Gait Assistance 7: Independent    Assistive device None    Gait Pattern Decreased stride length;Decreased stance time - left;Decreased step length - right;Antalgic    Ambulation Surface Level;Indoor    Gait velocity decreased                        Objective measurements completed on examination: See above findings.       [place] Adult PT Treatment/Exercise - 12/14/21 0001       Lumbar Exercises: Stretches   Other Lumbar Stretch Exercise prone on elbows 2 minutes (centralized to lumbar)      Lumbar Exercises: Supine   Ab Set 5 reps;5 seconds    Bridge 5 reps                     PT Education - 12/14/21 0950     Education Details on evaluation findings, POC and HEP    Person(s) Educated Patient    Methods Explanation;Handout    Comprehension Verbalized understanding              PT Short Term Goals - 12/14/21 1009       PT SHORT TERM GOAL #1   Title Patient will be independent with initial HEP and self-management strategies to improve functional outcomes    Time 3    Period Weeks    Status New    Target Date 01/04/22               PT Long Term Goals - 12/14/21 1010       PT LONG TERM GOAL #1   Title Patient will be independent with advanced HEP and self-management strategies to improve functional outcomes    Time 6    Period Weeks    Status New    Target Date 01/25/22      PT LONG TERM GOAL #2   Title Patient will have equal to or > 4/5 MMT throughout BLE to improve ability to perform functional mobility, stair ambulation and ADLs.    Time 6    Period Weeks  Status New    Target Date 01/25/22      PT LONG TERM GOAL #3   Title Patient will report at least 65% overall improvement in  subjective complaint to indicate improvement in ability to perform ADLs.    Time 6    Period Weeks    Status New    Target Date 01/25/22                    Plan - 12/14/21 1006     Clinical Impression Statement Patient is a 51 y.o. female who presents to physical therapy with complaint of lumbar radiculopathy. Patient demonstrates decreased strength, ROM restriction, increased TTP and gait abnormalities which are likely contributing to symptoms of pain and are negatively impacting patient ability to perform ADLs and functional mobility tasks. Patient will benefit from skilled physical therapy services to address these deficits to reduce pain, improve level of function with ADLs and functional mobility tasks.    Personal Factors and Comorbidities Time since onset of injury/illness/exacerbation    Examination-Activity Limitations Bend;Carry;Stairs;Stand;Locomotion Level;Transfers    Examination-Participation Restrictions Shop;Community Activity;Cleaning    Stability/Clinical Decision Making Stable/Uncomplicated    Clinical Decision Making Low    Rehab Potential Good    PT Frequency 2x / week    PT Duration 6 weeks    PT Treatment/Interventions ADLs/Self Care Home Management;DME Instruction;Gait training;Stair training;Functional mobility training;Therapeutic activities;Therapeutic exercise;Balance training;Patient/family education;Manual techniques;Energy conservation;Dry needling;Ultrasound;Taping;Splinting;Orthotic Fit/Training;Neuromuscular re-education;Cognitive remediation;Passive range of motion;Spinal Manipulations    PT Next Visit Plan Review HEP. Progress hip and core strength as able. Assess response to extension based exercises.    PT Home Exercise Plan Eval: ab brace, prone on elbows, bridge    Consulted and Agree with Plan of Care Patient             Patient will benefit from skilled therapeutic intervention in order to improve the following deficits and  impairments:  Abnormal gait, Decreased activity tolerance, Decreased balance, Decreased strength, Difficulty walking, Impaired perceived functional ability, Pain, Improper body mechanics, Decreased range of motion  Visit Diagnosis: Low back pain, unspecified back pain laterality, unspecified chronicity, unspecified whether sciatica present  Pain in left hip     Problem List Patient Active Problem List   Diagnosis Date Noted   Trochanteric bursitis of left hip 11/10/2021   Muscle cramp, nocturnal 11/10/2021   Port-A-Cath in place 12/29/2020   Genetic testing 10/08/2020   Family history of breast cancer 09/22/2020   Primary malignant neoplasm of upper outer quadrant of female breast, right ([place]) 08/27/2020   10:19 AM, 12/14/21 Josue Hector PT DPT  Physical Therapist with [facility]  [phone] [place] [address] [place], [place], [address] Phone: [phone]   Fax:  [phone]  Name: Monica [name] MRN: [id number] Date of Birth: May 22, 1971

## 2021-12-14 NOTE — Patient Instructions (Signed)
Access Code: [email or web address] URL: [email or web address] Date: 12/14/2021 Prepared by: Josue Hector  Exercises Prone on Elbows Stretch - 3 x daily - 7 x weekly - 1 sets - 1 reps - 2-3 minute hold Supine Transversus Abdominis Bracing - Hands on Ground - 3 x daily - 7 x weekly - 2 sets - 10 reps - 3-5 second hold Supine Bridge - 3 x daily - 7 x weekly - 2 sets - 10 reps

## 2021-12-14 NOTE — Progress Notes (Signed)
Lumbar spine x-ray shows some arthritis changes and probable spondylolisthesis which can cause the spine to shift a bit.  The treatment for spondylolisthesis is physical therapy.  Please start physical therapy.

## 2021-12-16 ENCOUNTER — Telehealth: Payer: Self-pay | Admitting: Adult Health

## 2021-12-16 NOTE — Telephone Encounter (Signed)
Rescheduled appointment per iruku template. Left message with patient of new appointment time.

## 2021-12-22 ENCOUNTER — Ambulatory Visit (HOSPITAL_COMMUNITY): Payer: Medicaid Other | Admitting: Physical Therapy

## 2021-12-22 ENCOUNTER — Other Ambulatory Visit: Payer: Self-pay

## 2021-12-22 ENCOUNTER — Encounter (HOSPITAL_COMMUNITY): Payer: Self-pay | Admitting: Physical Therapy

## 2021-12-22 DIAGNOSIS — M545 Low back pain, unspecified: Secondary | ICD-10-CM

## 2021-12-22 DIAGNOSIS — M25552 Pain in left hip: Secondary | ICD-10-CM

## 2021-12-22 NOTE — Patient Instructions (Signed)
Access Code: [facility] URL: [email or web address] Date: 12/22/2021 Prepared by: Josue Hector  Exercises Hooklying Isometric Hip Flexion with Opposite Arm - 2-3 x daily - 7 x weekly - 2 sets - 10 reps - 5 second hold Supine March - 2-3 x daily - 7 x weekly - 2 sets - 10 reps

## 2021-12-22 NOTE — Therapy (Signed)
[facility] [address] [place], [place], [address] Phone: [phone]   Fax:  [phone]  Physical Therapy Treatment  Patient Details  Name: Monica [name] MRN: [id number] Date of Birth: 02/08/1971 Referring Provider (PT): Lynne Leader MD   Encounter Date: 12/22/2021   PT End of Session - 12/22/21 0955     Visit Number 2    Number of Visits 12    Date for PT Re-Evaluation 01/25/22    Authorization Type [place] Medicaid [employer]    Authorization Time Period 12 approved 1/10-3/10/23    Authorization - Visit Number 2    Authorization - Number of Visits 12    Progress Note Due on Visit 10    PT Start Time 0950    PT Stop Time 1030    PT Time Calculation (min) 40 min    Activity Tolerance Patient limited by pain    Behavior During Therapy [facility] for tasks assessed/performed             Past Medical History:  Diagnosis Date   Asthma    Breast cancer ([place])    Family history of breast cancer 09/22/2020   Hypertension    Personal history of chemotherapy    Personal history of radiation therapy     Past Surgical History:  Procedure Laterality Date   ABDOMINAL HYSTERECTOMY     APPENDECTOMY     BREAST LUMPECTOMY WITH RADIOACTIVE SEED AND SENTINEL LYMPH NODE BIOPSY Right 09/10/2020   Procedure: RIGHT BREAST LUMPECTOMY WITH RADIOACTIVE SEED AND SENTINEL [place];  Surgeon: Erroll Luna, MD;  Location: [facility];  Service: General;  Laterality: Right;   PORTACATH PLACEMENT Right 10/16/2020   Procedure: INSERTION PORT-A-CATH WITH ULTRASOUND GUIDANCE;  Surgeon: Erroll Luna, MD;  Location: [place];  Service: General;  Laterality: Right;   RE-EXCISION OF BREAST LUMPECTOMY Right 10/16/2020   Procedure: RE-EXCISION OF RIGHT BREAST LUMPECTOMY;  Surgeon: Erroll Luna, MD;  Location: [facility];  Service: General;  Laterality: Right;    There were no vitals filed for  this visit.   Subjective Assessment - 12/22/21 0953     Subjective Patient reports continued cramping in legs. Pain is about the same. She has not been doing HEP exercise. She has an MRI scheduled next week.    Limitations Sitting;Lifting;Standing;Walking;House hold activities    Patient Stated Goals Decrease pain    Currently in Pain? Yes    Pain Score 9     Pain Location Back    Pain Orientation Lower;Posterior    Pain Descriptors / Indicators Sharp;Numbness    Pain Type Chronic pain    Pain Onset More than a month ago    Pain Frequency Constant                               [facility] Adult PT Treatment/Exercise - 12/22/21 0001       Lumbar Exercises: Stretches   Other Lumbar Stretch Exercise prone on elbows 3 minutes      Lumbar Exercises: Supine   Ab Set 15 reps;5 seconds    Bent Knee Raise 20 reps    Bridge 20 reps    Straight Leg Raise 10 reps   with ab brace, limited ROM on LT   Other Supine Lumbar Exercises hip flexion iso LLE 10 x 5"      Lumbar Exercises: Sidelying   Clam Both;10  reps                       PT Short Term Goals - 12/14/21 1009       PT SHORT TERM GOAL #1   Title Patient will be independent with initial HEP and self-management strategies to improve functional outcomes    Time 3    Period Weeks    Status New    Target Date 01/04/22               PT Long Term Goals - 12/14/21 1010       PT LONG TERM GOAL #1   Title Patient will be independent with advanced HEP and self-management strategies to improve functional outcomes    Time 6    Period Weeks    Status New    Target Date 01/25/22      PT LONG TERM GOAL #2   Title Patient will have equal to or > 4/5 MMT throughout BLE to improve ability to perform functional mobility, stair ambulation and ADLs.    Time 6    Period Weeks    Status New    Target Date 01/25/22      PT LONG TERM GOAL #3   Title Patient will report at least 65% overall improvement  in subjective complaint to indicate improvement in ability to perform ADLs.    Time 6    Period Weeks    Status New    Target Date 01/25/22                   Plan - 12/22/21 1026     Clinical Impression Statement Reviewed goals and HEP. Initiated ther ex today. Patient shows fairly good return of HEP exercise so progressed glute and core strengthening exercise. Patient notably challenged with LT hip flexion exercise and limited ROM with SLR. Added hip flexion isometric for LLE for strengthening. Patient tolerating session well overall but does note increased muscle fatigue with exercise and requires short rest breaks throughout treatment. Issued updated HEP handout. Patient will continue to benefit from skilled therapy services to reduce deficits and improve functional ability.    Personal Factors and Comorbidities Time since onset of injury/illness/exacerbation    Examination-Activity Limitations Bend;Carry;Stairs;Stand;Locomotion Level;Transfers    Examination-Participation Restrictions Shop;Community Activity;Cleaning    Stability/Clinical Decision Making Stable/Uncomplicated    Rehab Potential Good    PT Frequency 2x / week    PT Duration 6 weeks    PT Treatment/Interventions ADLs/Self Care Home Management;DME Instruction;Gait training;Stair training;Functional mobility training;Therapeutic activities;Therapeutic exercise;Balance training;Patient/family education;Manual techniques;Energy conservation;Dry needling;Ultrasound;Taping;Splinting;Orthotic Fit/Training;Neuromuscular re-education;Cognitive remediation;Passive range of motion;Spinal Manipulations    PT Next Visit Plan Progress hip and core strength as able. Continue extension based exercises as indicated    PT Home Exercise Plan Eval: ab brace, prone on elbows, bridge 1/17 ab march, hip flexion iso    Consulted and Agree with Plan of Care Patient             Patient will benefit from skilled therapeutic intervention  in order to improve the following deficits and impairments:  Abnormal gait, Decreased activity tolerance, Decreased balance, Decreased strength, Difficulty walking, Impaired perceived functional ability, Pain, Improper body mechanics, Decreased range of motion  Visit Diagnosis: Low back pain, unspecified back pain laterality, unspecified chronicity, unspecified whether sciatica present  Pain in left hip     Problem List Patient Active Problem List   Diagnosis Date Noted   Trochanteric bursitis of left  hip 11/10/2021   Muscle cramp, nocturnal 11/10/2021   Port-A-Cath in place 12/29/2020   Genetic testing 10/08/2020   Family history of breast cancer 09/22/2020   Primary malignant neoplasm of upper outer quadrant of female breast, right ([place]) 08/27/2020   10:31 AM, 12/22/21 Josue Hector PT DPT  Physical Therapist with [facility]  [phone] [place], [place], [address] Phone: [phone]   Fax:  [phone]  Name: Monica [name] MRN: [id number] Date of Birth: Sep 13, 1971

## 2021-12-24 ENCOUNTER — Telehealth: Payer: Self-pay | Admitting: Hematology and Oncology

## 2021-12-24 ENCOUNTER — Other Ambulatory Visit: Payer: Self-pay

## 2021-12-24 ENCOUNTER — Encounter (HOSPITAL_COMMUNITY): Payer: Self-pay

## 2021-12-24 ENCOUNTER — Ambulatory Visit (HOSPITAL_COMMUNITY): Payer: Medicaid Other

## 2021-12-24 DIAGNOSIS — M545 Low back pain, unspecified: Secondary | ICD-10-CM | POA: Diagnosis not present

## 2021-12-24 DIAGNOSIS — M25552 Pain in left hip: Secondary | ICD-10-CM

## 2021-12-24 NOTE — Therapy (Signed)
[place], [place], [address] Phone: [phone]   Fax:  [phone]  Physical Therapy Treatment  Patient Details  Name: Monica [name] MRN: [id number] Date of Birth: 09-17-1971 Referring Provider (PT): Lynne Leader MD   Encounter Date: 12/24/2021   PT End of Session - 12/24/21 0910     Visit Number 3    Number of Visits 12    Date for PT Re-Evaluation 01/25/22    Authorization Type [place] [employer]    Authorization Time Period 12 approved 1/10-3/10/23    Authorization - Visit Number 3    Authorization - Number of Visits 12    Progress Note Due on Visit 10    PT Start Time 0903    PT Stop Time 0942    PT Time Calculation (min) 39 min    Activity Tolerance Patient limited by pain;Patient tolerated treatment well    Behavior During Therapy [facility] for tasks assessed/performed             Past Medical History:  Diagnosis Date   Asthma    Breast cancer ([place])    Family history of breast cancer 09/22/2020   Hypertension    Personal history of chemotherapy    Personal history of radiation therapy     Past Surgical History:  Procedure Laterality Date   ABDOMINAL HYSTERECTOMY     APPENDECTOMY     BREAST LUMPECTOMY WITH RADIOACTIVE SEED AND SENTINEL LYMPH NODE BIOPSY Right 09/10/2020   Procedure: RIGHT BREAST LUMPECTOMY WITH RADIOACTIVE SEED AND SENTINEL LYMPH NODE MAPPING;  Surgeon: Erroll Luna, MD;  Location: [place];  Service: General;  Laterality: Right;   PORTACATH PLACEMENT Right 10/16/2020   Procedure: INSERTION PORT-A-CATH WITH ULTRASOUND GUIDANCE;  Surgeon: Erroll Luna, MD;  Location: [place];  Service: General;  Laterality: Right;   RE-EXCISION OF BREAST LUMPECTOMY Right 10/16/2020   Procedure: RE-EXCISION OF RIGHT BREAST LUMPECTOMY;  Surgeon: Erroll Luna, MD;  Location: [place];  Service: General;  Laterality: Right;     There were no vitals filed for this visit.   Subjective Assessment - 12/24/21 0906     Subjective Pt stated she had MRI next Tuesday.  Pain scale 7/10 Lt LE down to toes.    Pertinent History Breast CA with one more chemo session    Patient Stated Goals Decrease pain    Currently in Pain? Yes    Pain Score 7     Pain Location Back    Pain Orientation Lower    Pain Descriptors / Indicators Sharp;Numbness    Pain Type Chronic pain    Pain Radiating Towards Lt foot and toes    Aggravating Factors  sitting, standing, walking    Pain Relieving Factors meds    Effect of Pain on Daily Activities limits                               [facility] Adult PT Treatment/Exercise - 12/24/21 0001       Lumbar Exercises: Stretches   Standing Extension 5 reps;5 seconds    Other Lumbar Stretch Exercise prone on elbows 3 minutes      Lumbar Exercises: Supine   AB Set Limitations 2 min pairing exhale wiht ab set    Bridge 20 reps;3 seconds   2 sets 10 reps   Other Supine Lumbar Exercises hip flexion iso LLE 10  x 5"      Lumbar Exercises: Sidelying   Hip Abduction Both;10 reps      Lumbar Exercises: Prone   Straight Leg Raise 5 reps;3 seconds    Other Prone Lumbar Exercises heel squeeze 10c5"      Lumbar Exercises: Quadruped   Single Arm Raise 5 reps;3 seconds                       PT Short Term Goals - 12/14/21 1009       PT SHORT TERM GOAL #1   Title Patient will be independent with initial HEP and self-management strategies to improve functional outcomes    Time 3    Period Weeks    Status New    Target Date 01/04/22               PT Long Term Goals - 12/14/21 1010       PT LONG TERM GOAL #1   Title Patient will be independent with advanced HEP and self-management strategies to improve functional outcomes    Time 6    Period Weeks    Status New    Target Date 01/25/22      PT LONG TERM GOAL #2   Title Patient will have equal to or >  4/5 MMT throughout BLE to improve ability to perform functional mobility, stair ambulation and ADLs.    Time 6    Period Weeks    Status New    Target Date 01/25/22      PT LONG TERM GOAL #3   Title Patient will report at least 65% overall improvement in subjective complaint to indicate improvement in ability to perform ADLs.    Time 6    Period Weeks    Status New    Target Date 01/25/22                   Plan - 12/24/21 0914     Clinical Impression Statement Session focus with extension based exercises as reports of centralized radicular symptoms in prone position.  Attempted prone press ups, UE too weak and unable to press up.  Progressed gluteal strengthening with additional heel squeeze and prone SLR that was tolerated well as well as quadruped activities for core and UE strengthening.    Personal Factors and Comorbidities Time since onset of injury/illness/exacerbation    Comorbidities Ca    Examination-Activity Limitations Bend;Carry;Stairs;Stand;Locomotion Level;Transfers    Examination-Participation Restrictions Shop;Community Activity;Cleaning    Stability/Clinical Decision Making Stable/Uncomplicated    Clinical Decision Making Low    Rehab Potential Good    PT Frequency 2x / week    PT Duration 6 weeks    PT Treatment/Interventions ADLs/Self Care Home Management;DME Instruction;Gait training;Stair training;Functional mobility training;Therapeutic activities;Therapeutic exercise;Balance training;Patient/family education;Manual techniques;Energy conservation;Dry needling;Ultrasound;Taping;Splinting;Orthotic Fit/Training;Neuromuscular re-education;Cognitive remediation;Passive range of motion;Spinal Manipulations    PT Next Visit Plan Progress hip and core strength as able. Continue extension based exercises as indicated    PT Home Exercise Plan Eval: ab brace, prone on elbows, bridge 1/17 ab march, hip flexion iso; 1/19: prone heel squeeze, SLR, quadruped UE     Consulted and Agree with Plan of Care Patient             Patient will benefit from skilled therapeutic intervention in order to improve the following deficits and impairments:  Abnormal gait, Decreased activity tolerance, Decreased balance, Decreased strength, Difficulty walking, Impaired perceived functional ability, Pain, Improper body mechanics, Decreased  range of motion  Visit Diagnosis: Low back pain, unspecified back pain laterality, unspecified chronicity, unspecified whether sciatica present  Pain in left hip     Problem List Patient Active Problem List   Diagnosis Date Noted   Trochanteric bursitis of left hip 11/10/2021   Muscle cramp, nocturnal 11/10/2021   Port-A-Cath in place 12/29/2020   Genetic testing 10/08/2020   Family history of breast cancer 09/22/2020   Primary malignant neoplasm of upper outer quadrant of female breast, right ([place]) 08/27/2020   Ihor Austin, LPTA/CLT; CBIS [phone]  Aldona Lento, PTA 12/24/2021, 9:51 AM  [place], [place], [address] Phone: [phone]   Fax:  [phone]  Name: Gabrille Kilbride MRN: [id number] Date of Birth: [birth year]

## 2021-12-24 NOTE — Telephone Encounter (Signed)
Sch per 1/6 inbasekt, pt aware

## 2021-12-28 ENCOUNTER — Other Ambulatory Visit: Payer: Self-pay

## 2021-12-28 ENCOUNTER — Encounter (HOSPITAL_COMMUNITY): Payer: Self-pay

## 2021-12-28 ENCOUNTER — Ambulatory Visit (HOSPITAL_COMMUNITY): Payer: Medicaid Other

## 2021-12-28 DIAGNOSIS — M545 Low back pain, unspecified: Secondary | ICD-10-CM | POA: Diagnosis not present

## 2021-12-28 DIAGNOSIS — M25552 Pain in left hip: Secondary | ICD-10-CM

## 2021-12-28 NOTE — Therapy (Signed)
[place], [place], [address] Phone: [phone]   Fax:  [phone]  Physical Therapy Treatment  Patient Details  Name: Monica [name] MRN: [id number] Date of Birth: 08-Jun-1971 Referring Provider (PT): Lynne Leader MD   Encounter Date: 12/28/2021   PT End of Session - 12/28/21 1233     Visit Number 4    Number of Visits 12    Date for PT Re-Evaluation 01/25/22    Authorization Type [place] Medicaid [employer]    Authorization Time Period 12 approved 1/10-3/10/23    Authorization - Visit Number 4    Authorization - Number of Visits 12    Progress Note Due on Visit 10    PT Start Time 0940    PT Stop Time 1020    PT Time Calculation (min) 40 min    Activity Tolerance Patient limited by pain;Patient tolerated treatment well    Behavior During Therapy [facility] for tasks assessed/performed             Past Medical History:  Diagnosis Date   Asthma    Breast cancer ([place])    Family history of breast cancer 09/22/2020   Hypertension    Personal history of chemotherapy    Personal history of radiation therapy     Past Surgical History:  Procedure Laterality Date   ABDOMINAL HYSTERECTOMY     APPENDECTOMY     BREAST LUMPECTOMY WITH RADIOACTIVE SEED AND SENTINEL LYMPH NODE BIOPSY Right 09/10/2020   Procedure: RIGHT BREAST LUMPECTOMY WITH RADIOACTIVE SEED AND SENTINEL LYMPH NODE MAPPING;  Surgeon: Erroll Luna, MD;  Location: [place];  Service: General;  Laterality: Right;   PORTACATH PLACEMENT Right 10/16/2020   Procedure: INSERTION PORT-A-CATH WITH ULTRASOUND GUIDANCE;  Surgeon: Erroll Luna, MD;  Location: [place];  Service: General;  Laterality: Right;   RE-EXCISION OF BREAST LUMPECTOMY Right 10/16/2020   Procedure: RE-EXCISION OF RIGHT BREAST LUMPECTOMY;  Surgeon: Erroll Luna, MD;  Location: [place];  Service: General;  Laterality: Right;     There were no vitals filed for this visit.   Subjective Assessment - 12/28/21 1232     Subjective Pain down to her left foot today. Feels it got some worse after last session.    Pertinent History Breast CA with one more chemo session    Patient Stated Goals Decrease pain    Currently in Pain? Yes    Pain Score 8     Pain Location Hip    Pain Orientation Lateral    Pain Descriptors / Indicators Sharp;Shooting    Pain Radiating Towards left foot               [facility] Adult PT Treatment/Exercise - 12/28/21 0001       Self-Care   Self-Care Posture    Posture spinal anatomy and radicular symptoms      Lumbar Exercises: Stretches   Prone on Elbows Stretch --   3 minutes x2 on wedge   Press Ups 10 reps   from wedge   Other Lumbar Stretch Exercise prone lying 3 minutes x2      Lumbar Exercises: Supine   Ab Set 10 reps;5 seconds    Pelvic Tilt 10 reps    Bent Knee Raise 10 reps    Bent Knee Raise Limitations w/ ab set, spinal neutral    Bridge 10 reps;2 seconds    Bridge Limitations w/ ab set, spinal neutral  PT Education - 12/28/21 1233     Education Details Advanced HEP. Anatomy of spine and cause of radicular symptoms.    Person(s) Educated Patient    Methods Explanation;Handout    Comprehension Verbalized understanding              PT Short Term Goals - 12/28/21 1240       PT SHORT TERM GOAL #1   Title Patient will be independent with initial HEP and self-management strategies to improve functional outcomes    Time 3    Period Weeks    Status On-going    Target Date 01/04/22               PT Long Term Goals - 12/28/21 1240       PT LONG TERM GOAL #1   Title Patient will be independent with advanced HEP and self-management strategies to improve functional outcomes    Time 6    Period Weeks    Status On-going    Target Date 01/25/22      PT LONG TERM GOAL #2   Title Patient will have equal to or > 4/5 MMT  throughout BLE to improve ability to perform functional mobility, stair ambulation and ADLs.    Time 6    Period Weeks    Status On-going    Target Date 01/25/22      PT LONG TERM GOAL #3   Title Patient will report at least 65% overall improvement in subjective complaint to indicate improvement in ability to perform ADLs.    Time 6    Period Weeks    Status On-going    Target Date 01/25/22                   Plan - 12/28/21 1234     Clinical Impression Statement Session with continued focus of extension biased exercises but in a more non gravity dependent position today. Primarily focused on McKenzie exercises #1-3 and core stabilization exercises in supine or hooklying. Education on centralizing symptoms. Added lower trunk rotation stretch and anterior/posterior pelvic tilt to therapeutic exercises. Added McKenzie #1-3 to HEP with prone on elbows being modified to lying on a wedge. Patient reported centralization of leg pain to knee and LBP reduced to 3/10 post prone exercises and supine lower trunk rotations. Upon leaving clinic, patient reported pain in leg from knee to ankle only and LBP 1/10. Patient would continue to benefit from skilled physical therapy to reduce impairment and improve function.    Personal Factors and Comorbidities Time since onset of injury/illness/exacerbation    Comorbidities Ca    Examination-Activity Limitations Bend;Carry;Stairs;Stand;Locomotion Level;Transfers    Examination-Participation Restrictions Shop;Community Activity;Cleaning    Stability/Clinical Decision Making Stable/Uncomplicated    Rehab Potential Good    PT Frequency 2x / week    PT Duration 6 weeks    PT Treatment/Interventions ADLs/Self Care Home Management;DME Instruction;Gait training;Stair training;Functional mobility training;Therapeutic activities;Therapeutic exercise;Balance training;Patient/family education;Manual techniques;Energy conservation;Dry  needling;Ultrasound;Taping;Splinting;Orthotic Fit/Training;Neuromuscular re-education;Cognitive remediation;Passive range of motion;Spinal Manipulations    PT Next Visit Plan Progress hip and core strength as able. Continue extension based exercises as indicated; Add LTR to HEP; educate on body mechanics.    PT Home Exercise Plan Eval: ab brace, prone on elbows, bridge 1/17 ab march, hip flexion iso; 1/19: prone heel squeeze, SLR, quadruped UE; 1/23 - prone, prone on wedge, prone press up (from wedge)    Consulted and Agree with Plan of Care Patient  Patient will benefit from skilled therapeutic intervention in order to improve the following deficits and impairments:  Abnormal gait, Decreased activity tolerance, Decreased balance, Decreased strength, Difficulty walking, Impaired perceived functional ability, Pain, Improper body mechanics, Decreased range of motion  Visit Diagnosis: Low back pain, unspecified back pain laterality, unspecified chronicity, unspecified whether sciatica present  Pain in left hip     Problem List Patient Active Problem List   Diagnosis Date Noted   Trochanteric bursitis of left hip 11/10/2021   Muscle cramp, nocturnal 11/10/2021   Port-A-Cath in place 12/29/2020   Genetic testing 10/08/2020   Family history of breast cancer 09/22/2020   Primary malignant neoplasm of upper outer quadrant of female breast, right ([place]) 08/27/2020   Floria Raveling. Hartnett-Rands, MS, PT Per [place] [phone]  Jeannie Done, PT 12/28/2021, 12:46 PM  [place] [address] [place], [place], [address] Phone: [phone]   Fax:  [phone]  Name: Monica [name] MRN: [id number] Date of Birth: [birth year]

## 2021-12-28 NOTE — Patient Instructions (Addendum)
Access Code: [facility] URL: [email or web address] Date: 12/28/2021 Prepared by: Dietra Stokely Hartnett-Rands  Exercises Lying Prone with 1 Pillow - 1 x daily - 7 x weekly - 3 sets - 10 reps Prone on Elbows Stretch - 1 x daily - 7 x weekly - 3 sets - 10 reps Prone Press Up - 1 x daily - 7 x weekly - 3 sets - 10 reps

## 2021-12-29 ENCOUNTER — Ambulatory Visit
Admission: RE | Admit: 2021-12-29 | Discharge: 2021-12-29 | Disposition: A | Discharge status: Home / Self Care | Payer: Medicaid Other | Source: Ambulatory Visit | Attending: Family Medicine | Admitting: Family Medicine

## 2021-12-29 DIAGNOSIS — M79605 Pain in left leg: Secondary | ICD-10-CM

## 2021-12-29 DIAGNOSIS — M5416 Radiculopathy, lumbar region: Secondary | ICD-10-CM

## 2021-12-29 IMAGING — MR MR LUMBAR SPINE WO/W CM
4 of 7 series · 22 of 48 positions shown · IV contrast (multihance)
Comparison: Lumbar spine radiographs [DATE]. Abdominopelvic CT
[DATE] (report only).

CLINICAL DATA: Low back pain with bilateral leg pain, left greater
than right. Symptoms present for 3 months. No previous relevant
surgery. History of breast cancer.

EXAM:
MRI LUMBAR SPINE WITHOUT AND WITH CONTRAST
TECHNIQUE: Multiplanar and multiecho pulse sequences of the lumbar spine were
obtained without and with intravenous contrast.
CONTRAST:  18mL MULTIHANCE GADOBENATE DIMEGLUMINE 529 MG/ML IV SOLN

[Series 3: T1 · sagittal · 4.0mm · 0.88mm/px · 3 of 16 slices shown (1 of 2)]
[im 1/16]
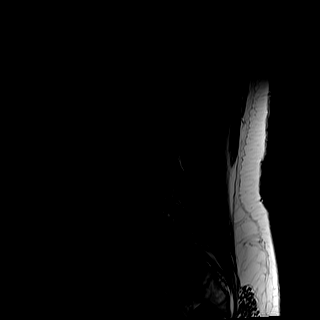
[im 8/16]
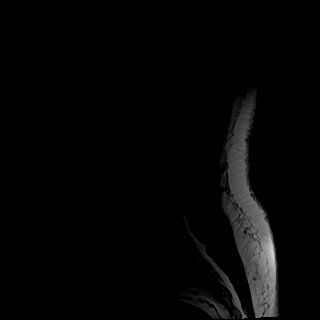
[im 16/16]
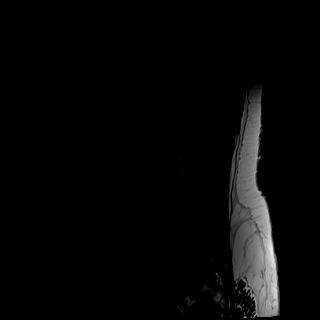

[Series 5: T2 · axial · 4.0mm · 0.39mm/px · z∈[-72,+151]mm · 11 of 42 slices shown (1 of 2)]
[im 1/42]
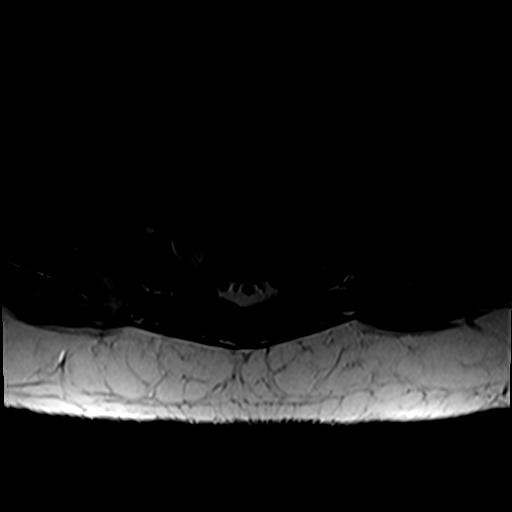
[im 5/42]
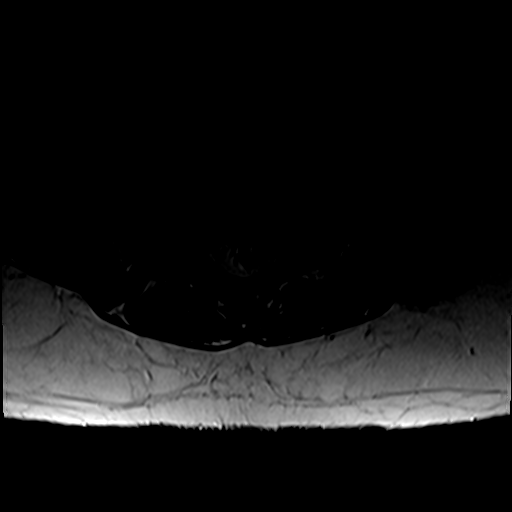
[im 9/42]
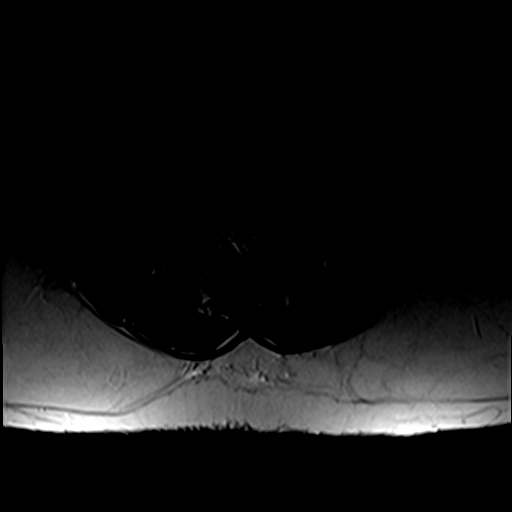
[im 13/42]
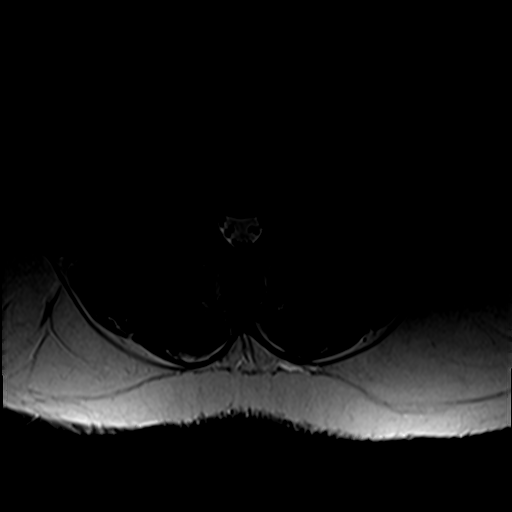
[im 17/42]
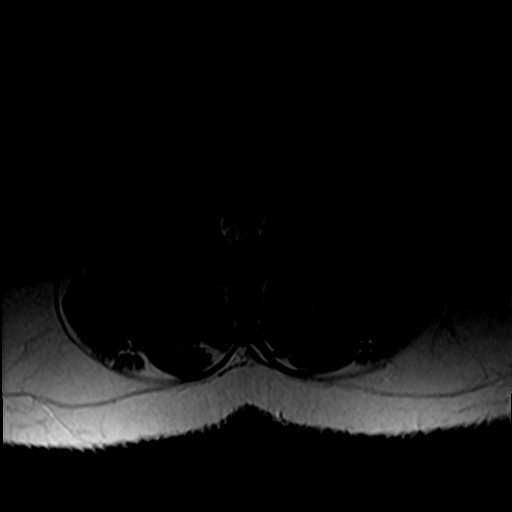
[im 21/42]
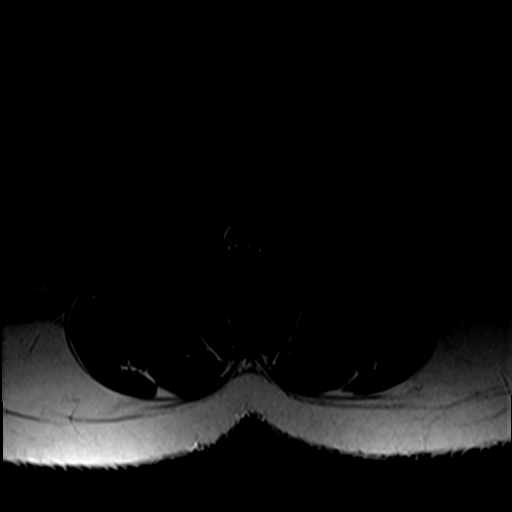
[im 25/42]
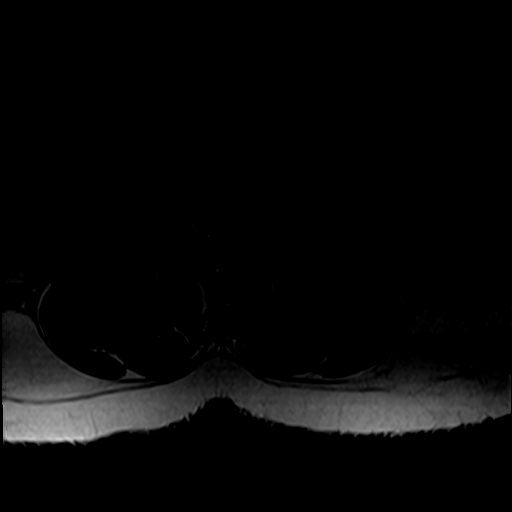
[im 29/42]
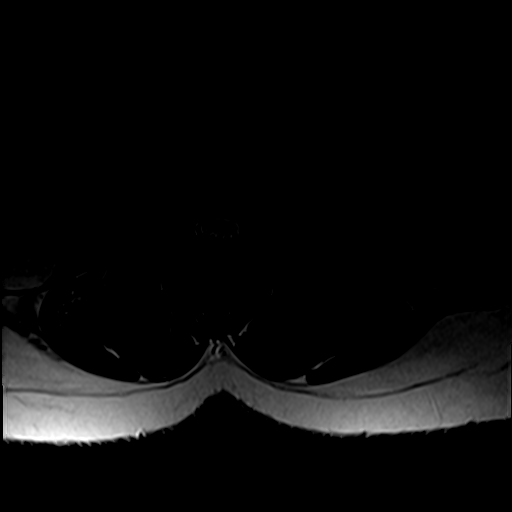
[im 33/42]
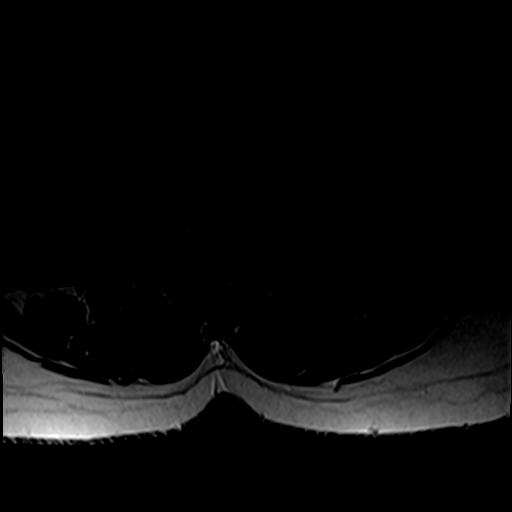
[im 37/42]
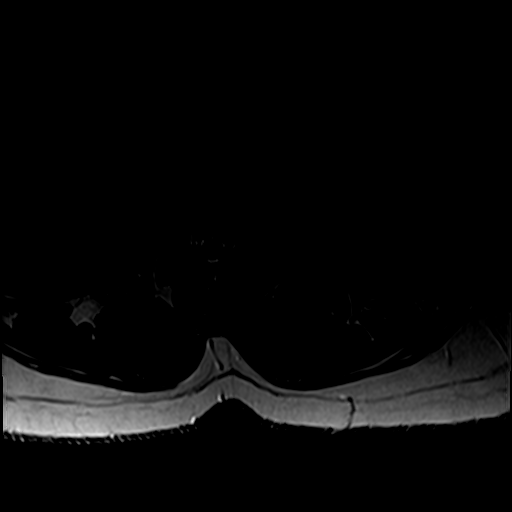
[im 42/42]
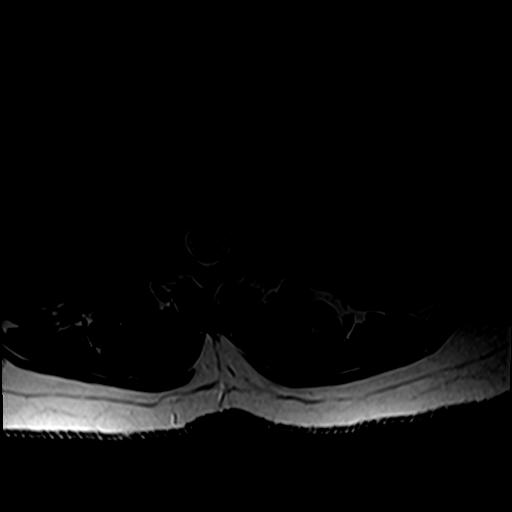

[Series 6: T1 · axial · 4.0mm · 0.39mm/px · z∈[-72,+127]mm · 4 of 42 slices shown (2 of 2)]
[im 1/42]
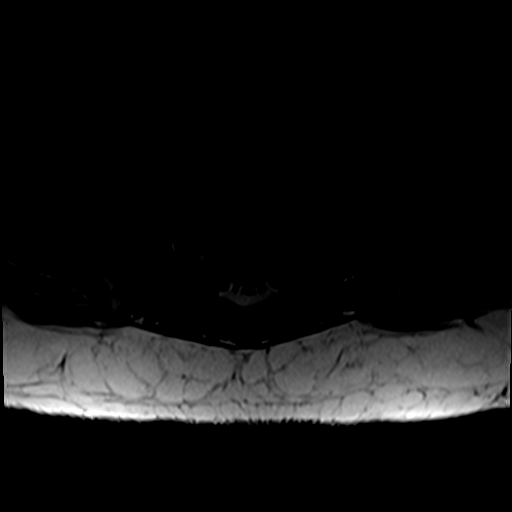
[im 5/42]
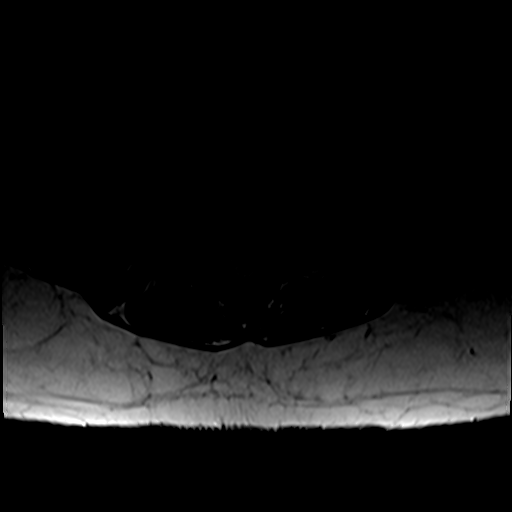
[im 21/42]
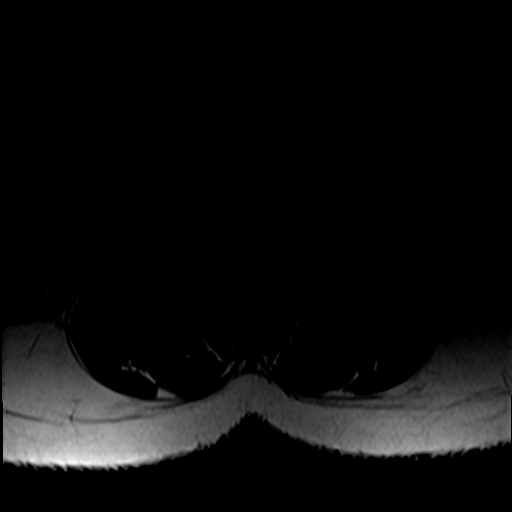
[im 37/42]
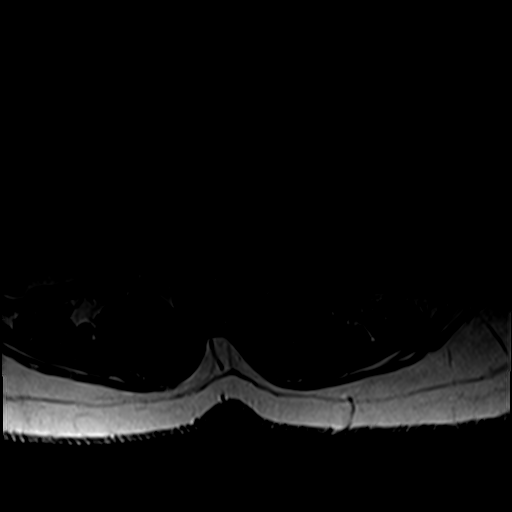

[Series 7: T2 · sagittal · 4.0mm · 1.09mm/px · 4 of 16 slices shown (2 of 2)]
[im 1/16]
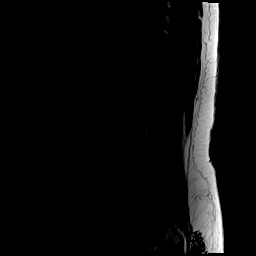
[im 6/16]
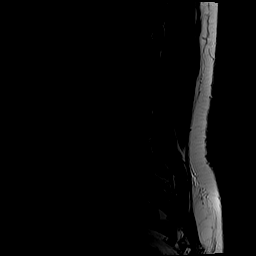
[im 11/16]
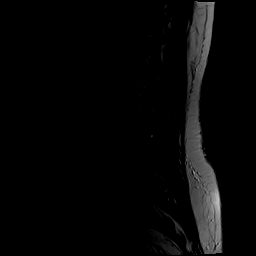
[im 16/16]
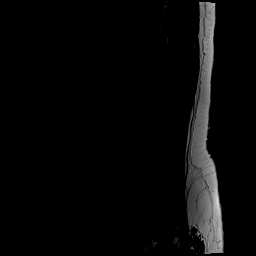

[22 of 48 positions shown; findings below may reference images not displayed]

FINDINGS: Segmentation: Conventional anatomy assumed, with the last open disc
space designated L5-S1.Concordant with previous imaging.

Alignment: Approximately 5 mm of anterolisthesis at L5-S1 secondary
to chronic bilateral L5 pars defects.

Vertebrae: No worrisome lesion or acute fracture. As above, chronic
bilateral L5 pars defects with associated chronic endplate
degenerative changes at L5-S1. Sacroiliac degenerative changes are
present bilaterally.

Conus medullaris: Extends to the L1-2 level and appears normal.

Paraspinal and other soft tissues: No significant paraspinal
findings.

Disc levels:

Mild disc bulging from T10-11 through L1-2 without resulting spinal
stenosis or nerve root encroachment.

L2-3: Mild disc bulging with anterior osteophytes and mild facet
hypertrophy. No spinal stenosis or nerve root encroachment.

L3-4: Mild loss of disc height with mild disc bulging. No spinal
stenosis or nerve root encroachment.

L4-5: Mild loss of disc height with disc bulging and a broad-based
central disc protrusion. Mild bilateral facet hypertrophy. There is
mild narrowing of the lateral recesses without L5 nerve root
encroachment. Both foramina are patent.

L5-S1: Loss of disc height with annular disc bulging and a grade 1
anterolisthesis related to chronic bilateral L5 pars defects. There
is resulting moderate to severe foraminal narrowing, left greater
than right. Bilateral L5 nerve root encroachment likely. The spinal
canal and lateral recesses are patent.
IMPRESSION: 1. Chronic bilateral L5 pars defects with grade 1 anterolisthesis
and diffuse disc degeneration at L5-S1. There is resulting moderate
to severe foraminal narrowing, left greater than right, with
probable bilateral L5 nerve root encroachment.
2. Broad-based central disc protrusion at L4-5 contributes to mild
lateral recess narrowing, but no definite nerve root encroachment.
3. No other significant spinal stenosis or nerve root encroachment.
Mild disc degeneration at the additional levels as detailed above.

## 2021-12-29 MED ORDER — GADOBENATE DIMEGLUMINE 529 MG/ML IV SOLN
18.0000 mL | Freq: Once | INTRAVENOUS | Status: AC | PRN
  Administered 2021-12-29: 18 mL via INTRAVENOUS

## 2021-12-30 ENCOUNTER — Ambulatory Visit (HOSPITAL_COMMUNITY): Payer: Medicaid Other | Admitting: Physical Therapy

## 2021-12-30 ENCOUNTER — Encounter (HOSPITAL_COMMUNITY): Payer: Self-pay | Admitting: Physical Therapy

## 2021-12-30 ENCOUNTER — Other Ambulatory Visit: Payer: Self-pay

## 2021-12-30 DIAGNOSIS — M545 Low back pain, unspecified: Secondary | ICD-10-CM

## 2021-12-30 DIAGNOSIS — M25552 Pain in left hip: Secondary | ICD-10-CM

## 2021-12-30 NOTE — Patient Instructions (Signed)
Access Code: [email or web address] URL: [email or web address] Date: 12/30/2021 Prepared by: Josue Hector  Exercises Clamshell - 3 x daily - 7 x weekly - 2 sets - 10 reps Supine Lower Trunk Rotation - 3 x daily - 7 x weekly - 1 sets - 10 reps - 10 second hold

## 2021-12-30 NOTE — Therapy (Signed)
[facility] [address] [place], [place], [address] Phone: [phone]   Fax:  [phone]  Physical Therapy Treatment  Patient Details  Name: Monica [name] MRN: [id number] Date of Birth: [birth year] Referring Provider (PT): Lynne Leader MD   Encounter Date: 12/30/2021   PT End of Session - 12/30/21 1035     Visit Number 5    Number of Visits 12    Date for PT Re-Evaluation 01/25/22    Authorization Type  Medicaid [employer]    Authorization Time Period 12 approved 1/10-3/10/23    Authorization - Visit Number 5    Authorization - Number of Visits 12    Progress Note Due on Visit 10    PT Start Time 1030    PT Stop Time 1110    PT Time Calculation (min) 40 min    Activity Tolerance Patient tolerated treatment well    Behavior During Therapy [facility] for tasks assessed/performed             Past Medical History:  Diagnosis Date   Asthma    Breast cancer ([place])    Family history of breast cancer 09/22/2020   Hypertension    Personal history of chemotherapy    Personal history of radiation therapy     Past Surgical History:  Procedure Laterality Date   ABDOMINAL HYSTERECTOMY     APPENDECTOMY     BREAST LUMPECTOMY WITH RADIOACTIVE SEED AND SENTINEL LYMPH NODE BIOPSY Right 09/10/2020   Procedure: RIGHT BREAST LUMPECTOMY WITH RADIOACTIVE SEED AND SENTINEL [place];  Surgeon: Erroll Luna, MD;  Location: [place] [facility];  Service: General;  Laterality: Right;   PORTACATH PLACEMENT Right 10/16/2020   Procedure: INSERTION PORT-A-CATH WITH ULTRASOUND GUIDANCE;  Surgeon: Erroll Luna, MD;  Location: [facility];  Service: General;  Laterality: Right;   RE-EXCISION OF BREAST LUMPECTOMY Right 10/16/2020   Procedure: RE-EXCISION OF RIGHT BREAST LUMPECTOMY;  Surgeon: Erroll Luna, MD;  Location: [facility];  Service: General;  Laterality: Right;    There were no vitals  filed for this visit.   Subjective Assessment - 12/30/21 1033     Subjective Patient says she was very sore in legs after last time. Had some pain in her RT leg as well as LT leg. She felt that performing marching from HEP helped to get rid of the soreness, that and a heating pad.    Pertinent History Breast CA with one more chemo session    Patient Stated Goals Decrease pain    Currently in Pain? Yes    Pain Score 6     Pain Location Leg    Pain Orientation Left    Pain Descriptors / Indicators Numbness    Pain Type Chronic pain                               [facility] Adult PT Treatment/Exercise - 12/30/21 0001       Lumbar Exercises: Stretches   Lower Trunk Rotation 5 reps;10 seconds    Prone on Elbows Stretch 3 reps;60 seconds   decreased LT leg pain   Press Ups 10 reps      Lumbar Exercises: Supine   Bent Knee Raise 20 reps    Bridge 20 reps    Straight Leg Raise 20 reps    Straight Leg Raises Limitations with ab set      Lumbar Exercises:  Sidelying   Clam Both;20 reps                       PT Short Term Goals - 12/28/21 1240       PT SHORT TERM GOAL #1   Title Patient will be independent with initial HEP and self-management strategies to improve functional outcomes    Time 3    Period Weeks    Status On-going    Target Date 01/04/22               PT Long Term Goals - 12/28/21 1240       PT LONG TERM GOAL #1   Title Patient will be independent with advanced HEP and self-management strategies to improve functional outcomes    Time 6    Period Weeks    Status On-going    Target Date 01/25/22      PT LONG TERM GOAL #2   Title Patient will have equal to or > 4/5 MMT throughout BLE to improve ability to perform functional mobility, stair ambulation and ADLs.    Time 6    Period Weeks    Status On-going    Target Date 01/25/22      PT LONG TERM GOAL #3   Title Patient will report at least 65% overall improvement in  subjective complaint to indicate improvement in ability to perform ADLs.    Time 6    Period Weeks    Status On-going    Target Date 01/25/22                   Plan - 12/30/21 1103     Clinical Impression Statement Patient continues to be limited by LT hip weakness. Increased reps with SLR through available ROM and clamshells and added to HEP for improved LLE strengthening. Patient demos consistent pain reduction and centralization of symptoms form LT leg when performing prone on elbows. Tolerated prone press ups well, noting 0/10 pain following. Educated patient on this process and to continue with HEP at home and perform when leg pain increases. Patient will continue to benefit from skilled therapy services to reduce deficits and improve functional level.    Personal Factors and Comorbidities Time since onset of injury/illness/exacerbation    Comorbidities Ca    Examination-Activity Limitations Bend;Carry;Stairs;Stand;Locomotion Level;Transfers    Examination-Participation Restrictions Shop;Community Activity;Cleaning    Stability/Clinical Decision Making Stable/Uncomplicated    Rehab Potential Good    PT Frequency 2x / week    PT Duration 6 weeks    PT Treatment/Interventions ADLs/Self Care Home Management;DME Instruction;Gait training;Stair training;Functional mobility training;Therapeutic activities;Therapeutic exercise;Balance training;Patient/family education;Manual techniques;Energy conservation;Dry needling;Ultrasound;Taping;Splinting;Orthotic Fit/Training;Neuromuscular re-education;Cognitive remediation;Passive range of motion;Spinal Manipulations    PT Next Visit Plan Progress hip and core strength as able. Continue extension based exercises as indicated. Progress to standing    PT Home Exercise Plan Eval: ab brace, prone on elbows, bridge 1/17 ab march, hip flexion iso; 1/19: prone heel squeeze, SLR, quadruped UE; 1/23 - prone, prone on wedge, prone press up (from wedge)  1/25 LTR, clamshell    Consulted and Agree with Plan of Care Patient             Patient will benefit from skilled therapeutic intervention in order to improve the following deficits and impairments:  Abnormal gait, Decreased activity tolerance, Decreased balance, Decreased strength, Difficulty walking, Impaired perceived functional ability, Pain, Improper body mechanics, Decreased range of motion  Visit Diagnosis: Low back pain,  unspecified back pain laterality, unspecified chronicity, unspecified whether sciatica present  Pain in left hip     Problem List Patient Active Problem List   Diagnosis Date Noted   Trochanteric bursitis of left hip 11/10/2021   Muscle cramp, nocturnal 11/10/2021   Port-A-Cath in place 12/29/2020   Genetic testing 10/08/2020   Family history of breast cancer 09/22/2020   Primary malignant neoplasm of upper outer quadrant of female breast, right ([place]) 08/27/2020   11:09 AM, 12/30/21 Josue Hector PT DPT  Physical Therapist with [facility]  [phone] [place], [place], [address] Phone: [phone]   Fax:  [phone]  Name: Monica [name] MRN: [id number] Date of Birth: 11-28-1971

## 2021-12-31 ENCOUNTER — Telehealth: Payer: Self-pay | Admitting: Family Medicine

## 2021-12-31 DIAGNOSIS — M5416 Radiculopathy, lumbar region: Secondary | ICD-10-CM

## 2021-12-31 NOTE — Progress Notes (Signed)
MRI lumbar spine shows several levels that could cause pinched nerves.  The L5 nerve roots look to be the worst pinched which makes sense based on your symptoms. We will talk about this in full detail when you follow-up with me on January 30. I have ordered an epidural steroid injection which should be helpful for pain control.  Please call [facility] at [phone] to schedule the injection.

## 2021-12-31 NOTE — Telephone Encounter (Signed)
Epidural steroid injection ordered 

## 2022-01-01 NOTE — Progress Notes (Signed)
I, Wendy Poet, LAT, ATC, am serving as scribe for Dr. Lynne Leader.  Monica [name] is a 51 y.o. female who presents to [facility] at [facility] today for f/u of L leg pain due to lumbar radiculopathy.  She was last seen by Dr. Georgina Snell on 12/11/21 and noted no change in her symptoms after having a L GT injection on 10/09/21.  She also noted numbness in her L leg at her last visit and due to new paresthesias combined w/ L post-lat leg pain from her hip to her foot/ankle, she was referred for an L-spine MRI.  She has completed 5 PT sessions and has more visits scheduled.  Today, pt reports pain in her L leg is still the same. Pt has been taking Gabapentin, however it is making her drowsy.  She rates her left leg pain/lumbar radiculopathy as severe and very bothersome.  Diagnostic testing: L-spine MRI- 12/29/21; L-spine XR- 12/11/21; L hip XR- 09/22/21  Pertinent review of systems: No fevers or chills  Relevant historical information: Breast cancer.   Exam:  BP (!) 148/98    Pulse 61    Ht 5\' 3"  (1.6 m)    Wt 193 lb 6.4 oz (87.7 kg)    SpO2 98%    BMI 34.26 kg/m  General: Well Developed, well nourished, and in no acute distress.   MSK: L-spine decreased lumbar motion. Lower extremity strength is intact.    Lab and Radiology Results MRI LUMBAR SPINE WITHOUT AND WITH CONTRAST   TECHNIQUE: Multiplanar and multiecho pulse sequences of the lumbar spine were obtained without and with intravenous contrast.   CONTRAST:  22mL MULTIHANCE GADOBENATE DIMEGLUMINE 529 MG/ML IV SOLN   COMPARISON:  Lumbar spine radiographs 12/11/2021. Abdominopelvic CT 01/27/2017 (report only).   FINDINGS: Segmentation: Conventional anatomy assumed, with the last open disc space designated L5-S1.Concordant with previous imaging.   Alignment: Approximately 5 mm of anterolisthesis at L5-S1 secondary to chronic bilateral L5 pars defects.   Vertebrae: No worrisome lesion or acute  fracture. As above, chronic bilateral L5 pars defects with associated chronic endplate degenerative changes at L5-S1. Sacroiliac degenerative changes are present bilaterally.   Conus medullaris: Extends to the L1-2 level and appears normal.   Paraspinal and other soft tissues: No significant paraspinal findings.   Disc levels:   Mild disc bulging from T10-11 through L1-2 without resulting spinal stenosis or nerve root encroachment.   L2-3: Mild disc bulging with anterior osteophytes and mild facet hypertrophy. No spinal stenosis or nerve root encroachment.   L3-4: Mild loss of disc height with mild disc bulging. No spinal stenosis or nerve root encroachment.   L4-5: Mild loss of disc height with disc bulging and a broad-based central disc protrusion. Mild bilateral facet hypertrophy. There is mild narrowing of the lateral recesses without L5 nerve root encroachment. Both foramina are patent.   L5-S1: Loss of disc height with annular disc bulging and a grade 1 anterolisthesis related to chronic bilateral L5 pars defects. There is resulting moderate to severe foraminal narrowing, left greater than right. Bilateral L5 nerve root encroachment likely. The spinal canal and lateral recesses are patent.   IMPRESSION: 1. 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 There is resulting moderate to severe foraminal narrowing, left greater than right, with probable bilateral L5 nerve root encroachment. 2. Broad-based central disc protrusion at L4-5 contributes to mild lateral recess narrowing, but no definite nerve root encroachment. 3. No other  significant spinal stenosis or nerve root encroachment. Mild disc degeneration at the additional levels as detailed above.     Electronically Signed   By: Richardean Sale M.D.   On: 12/30/2021 16:45 I, Lynne Leader, personally (independently) visualized and performed the interpretation  of the images attached in this note.      Assessment and Plan: 51 y.o. female with left lumbar radiculopathy at L5.  Patient has spondylolisthesis at this level and some bulging disks that also likely contribute to lumbar radiculopathy.  She already has had a an epidural steroid injection ordered which is scheduled for February 3 (Friday).  The meantime continue gabapentin as it is effective but sedating.  If the epidural steroid injection is not as helpful as we like we will try to get into different level or technique and if still not effective would refer to neurosurgery or spine orthopedic spine surgery.  Recheck as needed. Reviewed the MRI report and imaging in detail and discussed treatment plan and options going forward. Total encounter time 20 minutes including face-to-face time with the patient and, reviewing past medical record, and charting on the date of service.      Discussed warning signs or symptoms. Please see discharge instructions. Patient expresses understanding.   The above documentation has been reviewed and is accurate and complete Lynne Leader, M.D.

## 2022-01-04 ENCOUNTER — Other Ambulatory Visit: Payer: Self-pay

## 2022-01-04 ENCOUNTER — Telehealth: Payer: Self-pay | Admitting: Family Medicine

## 2022-01-04 ENCOUNTER — Ambulatory Visit (INDEPENDENT_AMBULATORY_CARE_PROVIDER_SITE_OTHER): Payer: Medicaid Other | Admitting: Family Medicine

## 2022-01-04 VITALS — BP 148/98 | HR 61 | Ht 63.0 in | Wt 193.4 lb

## 2022-01-04 DIAGNOSIS — M5416 Radiculopathy, lumbar region: Secondary | ICD-10-CM | POA: Diagnosis not present

## 2022-01-04 DIAGNOSIS — M4317 Spondylolisthesis, lumbosacral region: Secondary | ICD-10-CM | POA: Diagnosis not present

## 2022-01-04 MED ORDER — HYDROCODONE-ACETAMINOPHEN 5-325 MG PO TABS: 1.0000 | ORAL_TABLET | Freq: Four times a day (QID) | ORAL | 0 refills | Status: DC | PRN

## 2022-01-04 NOTE — Patient Instructions (Addendum)
Thank you for coming in today.   Please call [facility] at [phone] to schedule your spine injection.    Recheck back as needed

## 2022-01-04 NOTE — Telephone Encounter (Signed)
I sent in hydrocodone.  You can try taking that. Recommend taking gabapentin at bedtime as it probably will still be helpful then.

## 2022-01-04 NOTE — Telephone Encounter (Signed)
Called pt and relayed Dr. Clovis Riley message regarding the hydrocodone and gabapentin.  She verbalizes understanding.

## 2022-01-04 NOTE — Telephone Encounter (Signed)
Pt states pain meds was discussed at today's visit. She had to give it some thought and after the ride home from here, she would like the pain meds discussed to be called in.  She plans to discontinue the gabapentin as she does not feel like it is helping.

## 2022-01-05 ENCOUNTER — Encounter (HOSPITAL_COMMUNITY): Payer: Self-pay

## 2022-01-05 ENCOUNTER — Ambulatory Visit (HOSPITAL_COMMUNITY): Payer: Medicaid Other

## 2022-01-05 DIAGNOSIS — R262 Difficulty in walking, not elsewhere classified: Secondary | ICD-10-CM

## 2022-01-05 DIAGNOSIS — M545 Low back pain, unspecified: Secondary | ICD-10-CM | POA: Diagnosis not present

## 2022-01-05 DIAGNOSIS — M25552 Pain in left hip: Secondary | ICD-10-CM

## 2022-01-05 DIAGNOSIS — M6281 Muscle weakness (generalized): Secondary | ICD-10-CM

## 2022-01-05 NOTE — Therapy (Signed)
[facility] [address] [place], [place], [address] Phone: [phone]   Fax:  [phone]  Physical Therapy Treatment  Patient Details  Name: Monica [name] MRN: [id number] Date of Birth: Nov 30, 1971 Referring Provider (PT): Lynne Leader MD   Encounter Date: 01/05/2022   PT End of Session - 01/05/22 1007     Visit Number 6    Number of Visits 12    Date for PT Re-Evaluation 01/25/22    Authorization Type [place] Medicaid [employer]    Authorization Time Period 12 approved 1/10-3/10/23    Authorization - Visit Number 6    Authorization - Number of Visits 12    Progress Note Due on Visit 10    PT Start Time 0956    PT Stop Time 9371    PT Time Calculation (min) 43 min    Activity Tolerance Patient tolerated treatment well    Behavior During Therapy [facility] for tasks assessed/performed             Past Medical History:  Diagnosis Date   Asthma    Breast cancer ([place])    Family history of breast cancer 09/22/2020   Hypertension    Personal history of chemotherapy    Personal history of radiation therapy     Past Surgical History:  Procedure Laterality Date   ABDOMINAL HYSTERECTOMY     APPENDECTOMY     BREAST LUMPECTOMY WITH RADIOACTIVE SEED AND SENTINEL LYMPH NODE BIOPSY Right 09/10/2020   Procedure: RIGHT BREAST LUMPECTOMY WITH RADIOACTIVE SEED AND SENTINEL LYMPH NODE MAPPING;  Surgeon: Erroll Luna, MD;  Location: [facility];  Service: General;  Laterality: Right;   PORTACATH PLACEMENT Right 10/16/2020   Procedure: INSERTION PORT-A-CATH WITH ULTRASOUND GUIDANCE;  Surgeon: Erroll Luna, MD;  Location: [facility];  Service: General;  Laterality: Right;   RE-EXCISION OF BREAST LUMPECTOMY Right 10/16/2020   Procedure: RE-EXCISION OF RIGHT BREAST LUMPECTOMY;  Surgeon: Erroll Luna, MD;  Location: [facility];  Service: General;  Laterality: Right;    There were no vitals  filed for this visit.   Subjective Assessment - 01/05/22 1004     Subjective Pt stated pain scale 10/10 sharp and numbness down Lt LE.  Reports she to receive shot in back this Friday, MD to do 2 shots then concidering surgery.    Pertinent History Breast CA with one more chemo session    Patient Stated Goals Decrease pain    Currently in Pain? Yes    Pain Score 10-Worst pain ever    Pain Location Leg    Pain Orientation Left    Pain Descriptors / Indicators Numbness;Sharp    Pain Type Chronic pain    Pain Radiating Towards left foot    Pain Onset More than a month ago    Pain Frequency Constant    Aggravating Factors  sitting, standing, walking    Pain Relieving Factors meds    Effect of Pain on Daily Activities limits                               [facility] Adult PT Treatment/Exercise - 01/05/22 0001       Lumbar Exercises: Stretches   Standing Extension 5 reps;5 seconds    Standing Extension Limitations 3D hip excursion    Prone on Elbows Stretch --   3 min   Press Ups 2 reps;5 reps;10 seconds  Other Lumbar Stretch Exercise 3D hip excursion      Lumbar Exercises: Seated   Sit to Stand 5 reps    Sit to Stand Limitations 3D hip excursion      Lumbar Exercises: Supine   Dead Bug 10 reps;3 seconds   with ab set, cueing for coordination   Bridge 20 reps      Lumbar Exercises: Prone   Straight Leg Raise 5 reps;3 seconds      Modalities   Modalities Moist Heat      Moist Heat Therapy   Number Minutes Moist Heat 12 Minutes    Moist Heat Location Lumbar Spine   during supine exercises                      PT Short Term Goals - 12/28/21 1240       PT SHORT TERM GOAL #1   Title Patient will be independent with initial HEP and self-management strategies to improve functional outcomes    Time 3    Period Weeks    Status On-going    Target Date 01/04/22               PT Long Term Goals - 12/28/21 1240       PT LONG TERM GOAL  #1   Title Patient will be independent with advanced HEP and self-management strategies to improve functional outcomes    Time 6    Period Weeks    Status On-going    Target Date 01/25/22      PT LONG TERM GOAL #2   Title Patient will have equal to or > 4/5 MMT throughout BLE to improve ability to perform functional mobility, stair ambulation and ADLs.    Time 6    Period Weeks    Status On-going    Target Date 01/25/22      PT LONG TERM GOAL #3   Title Patient will report at least 65% overall improvement in subjective complaint to indicate improvement in ability to perform ADLs.    Time 6    Period Weeks    Status On-going    Target Date 01/25/22                   Plan - 01/05/22 1017     Clinical Impression Statement Began session with prone extension based exercises with reports of radicular symptoms and pain resolved in position.  Session focus on centralization of radicular symptoms as well as core and proximal strengthening.  Noted limited hip IR, added piriformis stretch and 3D hip excursion to HEP.  Pt educated hip ER anatomy with sciatic nerve.   No reports of pain at EOS.    Personal Factors and Comorbidities Time since onset of injury/illness/exacerbation    Comorbidities Ca    Examination-Activity Limitations Bend;Carry;Stairs;Stand;Locomotion Level;Transfers    Examination-Participation Restrictions Shop;Community Activity;Cleaning    Stability/Clinical Decision Making Stable/Uncomplicated    Clinical Decision Making Low    Rehab Potential Good    PT Frequency 2x / week    PT Duration 6 weeks    PT Treatment/Interventions ADLs/Self Care Home Management;DME Instruction;Gait training;Stair training;Functional mobility training;Therapeutic activities;Therapeutic exercise;Balance training;Patient/family education;Manual techniques;Energy conservation;Dry needling;Ultrasound;Taping;Splinting;Orthotic Fit/Training;Neuromuscular re-education;Cognitive  remediation;Passive range of motion;Spinal Manipulations    PT Next Visit Plan Progress hip and core strength as able. Continue extension based exercises as indicated. Progress to standing    PT Home Exercise Plan Eval: ab brace, prone on elbows, bridge 1/17 ab march,  hip flexion iso; 1/19: prone heel squeeze, SLR, quadruped UE; 1/23 - prone, prone on wedge, prone press up (from wedge) 1/25 LTR, clamshell; 1/31: bridge, piriformis stretch and 3D hip excursion.    Consulted and Agree with Plan of Care Patient             Patient will benefit from skilled therapeutic intervention in order to improve the following deficits and impairments:  Abnormal gait, Decreased activity tolerance, Decreased balance, Decreased strength, Difficulty walking, Impaired perceived functional ability, Pain, Improper body mechanics, Decreased range of motion  Visit Diagnosis: Low back pain, unspecified back pain laterality, unspecified chronicity, unspecified whether sciatica present  Pain in left hip  Muscle weakness (generalized)  Difficulty in walking, not elsewhere classified     Problem List Patient Active Problem List   Diagnosis Date Noted   Spondylolisthesis at L5-S1 level 01/04/2022   Lumbar radiculopathy 01/04/2022   Trochanteric bursitis of left hip 11/10/2021   Muscle cramp, nocturnal 11/10/2021   Port-A-Cath in place 12/29/2020   Genetic testing 10/08/2020   Family history of breast cancer 09/22/2020   Primary malignant neoplasm of upper outer quadrant of female breast, right ([place]) 08/27/2020   Ihor Austin, LPTA/CLT; CBIS [phone]  Aldona Lento, PTA 01/05/2022, 11:41 AM  [facility] [address] [place], [place], [address] Phone: [phone]   Fax:  [phone]  Name: Legna Mausolf MRN: [id number] Date of Birth: 1971/03/09

## 2022-01-05 NOTE — Patient Instructions (Addendum)
Bridge    Lie back, legs bent. Inhale, pressing hips up. Keeping ribs in, lengthen lower back. Exhale, rolling down along spine from top. Repeat 10 times. Do 2 sessions per day.  http://pm.exer.us/55   Copyright  VHI. All rights reserved.    Piriformis Stretch, Sitting    Sit, one ankle on opposite knee, same-side hand on crossed knee. Push down on knee, keeping spine straight. Lean torso forward, with flat back, until tension is felt in hamstrings and gluteals of crossed-leg side. Hold 30 seconds.  Repeat 3 times per session. Do 2 sessions per day.  Copyright  VHI. All rights reserved.   Piriformis Stretch, Supine    Lie supine, one ankle crossed onto opposite knee. Holding bottom leg behind knee, gently pull legs toward chest until stretch is felt in buttock of top leg. Hold 30 seconds. For deeper stretch gently push top knee away from body.  Repeat 3 times per session. Do 2 sessions per day.  Copyright  VHI. All rights reserved.

## 2022-01-06 ENCOUNTER — Inpatient Hospital Stay: Payer: Medicaid Other | Attending: Hematology and Oncology | Admitting: Adult Health

## 2022-01-06 ENCOUNTER — Other Ambulatory Visit: Payer: Self-pay

## 2022-01-06 ENCOUNTER — Encounter: Payer: Self-pay | Admitting: Adult Health

## 2022-01-06 ENCOUNTER — Encounter: Payer: Medicaid Other | Admitting: Adult Health

## 2022-01-06 VITALS — BP 124/79 | HR 87 | Temp 98.3°F | Resp 17 | Wt 193.6 lb

## 2022-01-06 DIAGNOSIS — C50411 Malignant neoplasm of upper-outer quadrant of right female breast: Secondary | ICD-10-CM | POA: Insufficient documentation

## 2022-01-06 DIAGNOSIS — E2839 Other primary ovarian failure: Secondary | ICD-10-CM

## 2022-01-06 DIAGNOSIS — M549 Dorsalgia, unspecified: Secondary | ICD-10-CM | POA: Diagnosis not present

## 2022-01-06 DIAGNOSIS — Z90710 Acquired absence of both cervix and uterus: Secondary | ICD-10-CM | POA: Insufficient documentation

## 2022-01-06 DIAGNOSIS — Z170 Estrogen receptor positive status [ER+]: Secondary | ICD-10-CM | POA: Diagnosis not present

## 2022-01-06 DIAGNOSIS — Z87891 Personal history of nicotine dependence: Secondary | ICD-10-CM | POA: Diagnosis not present

## 2022-01-06 DIAGNOSIS — I10 Essential (primary) hypertension: Secondary | ICD-10-CM | POA: Insufficient documentation

## 2022-01-06 DIAGNOSIS — Z803 Family history of malignant neoplasm of breast: Secondary | ICD-10-CM | POA: Insufficient documentation

## 2022-01-06 NOTE — Progress Notes (Signed)
SURVIVORSHIP VISIT:   BRIEF ONCOLOGIC HISTORY:  Oncology History  Primary malignant neoplasm of upper outer quadrant of female breast, right ([place])  08/12/2020 Initial Diagnosis   Screening mammogram on 07/07/20 showed an asymmetry. Diagnostic mammogram and [place] on 07/29/20 showed a 0.8cm mass at the 10 o'clock position and no right axillary adenopathy. Biopsy on 08/12/20 showed invasive and in situ mammary carcinoma, grade 2, HER-2 equivocal by IHC, positive by FISH, ER+ 80%, PR+ 60%, Ki67 15%.   08/26/2020 Cancer Staging   Staging form: Breast, AJCC 8th Edition - Clinical stage from 08/26/2020: Stage IA (cT1b, cN0, cM0, G2, ER+, PR+, HER2+) - Signed by Eppie Gibson, MD on 08/27/2020    09/10/2020 Surgery   Right lumpectomy ([place]): invasive lobular carcinoma, 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  Variants of uncertain significance detected in ATM (c.131A>G (p.Asp44Gly) and c.4658A>C (p.Glu1553Ala)) and WRN (c.2825+6G>T (intronic)).  The report date is October 02, 2020.   The Multi-Cancer Panel offered by [facility] includes sequencing and/or deletion duplication testing of the following 85 genes: AIP, ALK, APC, ATM, AXIN2,BAP1,  BARD1, BLM, BMPR1A, BRCA1, BRCA2, BRIP1, CASR, CDC73, CDH1, CDK4, CDKN1B, CDKN1C, CDKN2A (p14ARF), CDKN2A (p16INK4a), CEBPA, CHEK2, CTNNA1, DICER1, DIS3L2, EGFR (c.2369C>T, p.Thr790Met variant only), EPCAM (Deletion/duplication testing only), FH, FLCN, GATA2, GPC3, GREM1 (Promoter region deletion/duplication testing only), HOXB13 (c.251G>A, p.Gly84Glu), HRAS, KIT, MAX, MEN1, MET, MITF (c.952G>A, p.Glu318Lys variant only),  MLH1, MSH2, MSH3, MSH6, MUTYH, NBN, NF1, NF2, NTHL1, PALB2, PDGFRA, PHOX2B, PMS2, POLD1, POLE, POT1, PRKAR1A, PTCH1, PTEN, RAD50, RAD51C, RAD51D, RB1, RECQL4, RET, RNF43, RUNX1, SDHAF2, SDHA (sequence changes only), SDHB, SDHC, SDHD, SMAD4, SMARCA4, SMARCB1, SMARCE1, STK11, SUFU, TERC, TERT, TMEM127, TP53, TSC1, TSC2, VHL, WRN and WT1.   Amended report: The variant of uncertain significance (VUS) in [place] at  c.2825+6G>T (Intronic) has been reclassified to likely benign.  The change in variant classification was made as a result of re-review of evidence in light of new variant interpretation guidelines and/or new information. The amended report date is December 18, 2020.    02/10/2021 - 03/24/2021 Radiation Therapy   Site Technique Total Dose (Gy) Dose per Fx (Gy) Completed Fx Beam Energies  Breast, Right: Breast_Rt 3D 50/50 2 25/25 10X  Breast, Right: Breast_Rt_PAB_SCV 3D 50/50 2 25/25 6X, 15X  Breast, Right: Breast_Rt_Bst 3D 10/10 2 5/5 6X, 10X     04/13/2021 -  Anti-estrogen oral therapy   Initially anastrozole switched to letrozole for fatigue issues     INTERVAL HISTORY:  Ms. Renwick to review her survivorship care plan detailing her treatment course for breast cancer, as well as monitoring long-term side effects of that treatment, education regarding health maintenance, screening, and overall wellness and health promotion.     Overall, Ms. Roads reports feeling moderately well.  She has had issues with hip pain and has been noted to have tendinosis ligament tear and also has undergone MRI imaging which is demonstrated some pinched nerves which are likely causing her pain.  She was recommended to undergo a nerve block which she is scheduled for on February 3.  She is following up with Dr. Georgina Snell about this.  She has seen Dr. Lindi Adie previously who ordered restaging  CT chest abdomen and pelvis.  This has not yet been scheduled.  She continues to take letrozole daily and tolerates this moderately  well.  She does endorse fatigue, however has not consider changing the time of day that she is taking the medication.  REVIEW OF SYSTEMS:  Review of Systems  Constitutional:  Positive for fatigue. Negative for appetite change, chills, fever and unexpected weight change.  HENT:   Negative for hearing loss, lump/mass and trouble swallowing.   Eyes:  Negative for eye problems and icterus.  Respiratory:  Negative for chest tightness, cough and shortness of breath.   Cardiovascular:  Negative for chest pain, leg swelling and palpitations.  Gastrointestinal:  Negative for abdominal distention, abdominal pain, constipation, diarrhea, nausea and vomiting.  Endocrine: Negative for hot flashes.  Genitourinary:  Negative for difficulty urinating.   Musculoskeletal:  Negative for arthralgias.  Skin:  Negative for itching and rash.  Neurological:  Negative for dizziness, extremity weakness, headaches and numbness.  Hematological:  Negative for adenopathy. Does not bruise/bleed easily.  Psychiatric/Behavioral:  Positive for depression (Controlled on Lexapro). Negative for suicidal ideas. The patient is nervous/anxious (Controlled on Lexapro).   Breast: Denies any new nodularity, masses, tenderness, nipple changes, or nipple discharge.      ONCOLOGY TREATMENT TEAM:  1. Surgeon:  Dr. Brantley Stage at [facility] 2. Medical Oncologist: Dr. Lindi Adie  3. Radiation Oncologist: Dr. Isidore Moos    PAST MEDICAL/SURGICAL HISTORY:  Past Medical History:  Diagnosis Date   Asthma    Breast cancer [facility])    Family history of breast cancer 09/22/2020   Hypertension    Personal history of chemotherapy    Personal history of radiation therapy    Past Surgical History:  Procedure Laterality Date   ABDOMINAL HYSTERECTOMY     APPENDECTOMY     BREAST LUMPECTOMY WITH RADIOACTIVE SEED AND SENTINEL LYMPH NODE BIOPSY Right 09/10/2020   Procedure: RIGHT BREAST LUMPECTOMY WITH RADIOACTIVE SEED AND SENTINEL LYMPH  NODE MAPPING;  Surgeon: Erroll Luna, MD;  Location: [place];  Service: General;  Laterality: Right;   PORTACATH PLACEMENT Right 10/16/2020   Procedure: INSERTION PORT-A-CATH WITH ULTRASOUND GUIDANCE;  Surgeon: Erroll Luna, MD;  Location: [place];  Service: General;  Laterality: Right;   RE-EXCISION OF BREAST LUMPECTOMY Right 10/16/2020   Procedure: RE-EXCISION OF RIGHT BREAST LUMPECTOMY;  Surgeon: Erroll Luna, MD;  Location: [place];  Service: General;  Laterality: Right;     ALLERGIES:  No Known Allergies   CURRENT MEDICATIONS:  Outpatient Encounter Medications as of 01/06/2022  Medication Sig   albuterol (VENTOLIN HFA) 108 (90 Base) MCG/ACT inhaler Inhale 2 puffs into the lungs every 4 (four) hours as needed for wheezing or shortness of breath.   baclofen (LIORESAL) 10 MG tablet Take 1 tablet (10 mg total) by mouth at bedtime as needed for muscle spasms.   Cholecalciferol (VITAMIN D3) 125 MCG (5000 UT) TABS Take 1 tablet by mouth daily.   clonazePAM (KLONOPIN) 0.5 MG tablet Take 0.5 mg by mouth daily as needed.   escitalopram (LEXAPRO) 10 MG tablet Take 10 mg by mouth daily.   escitalopram (LEXAPRO) 5 MG tablet Take 5 mg by mouth daily.   gabapentin (NEURONTIN) 300 MG capsule Take 1 capsule (300 mg total) by mouth 3 (three) times daily.   HYDROcodone-acetaminophen (NORCO/VICODIN) 5-325 MG tablet Take 1 tablet by mouth every 6 (six) hours as needed.   lisinopril (ZESTRIL) 10 MG tablet Take 1 tablet (10 mg  total) by mouth daily.   LORazepam (ATIVAN) 0.5 MG tablet Take 0.5 mg by mouth every 8 (eight) hours. As needed   prazosin (MINIPRESS) 1 MG capsule Take 1 mg by mouth at bedtime.   No facility-administered encounter medications on file as of 01/06/2022.     ONCOLOGIC FAMILY HISTORY:  Family History  Problem Relation Age of Onset   Hypertension Father    Hypertension Sister    Breast cancer Maternal Aunt 67   Breast  cancer Paternal Aunt        dx at unknown age   Hypertension Maternal Grandmother    Breast cancer Other        MGM's niece; dx 23s   Breast cancer Other        MGF's niece     GENETIC COUNSELING/TESTING: See above  SOCIAL HISTORY:  Social History   Socioeconomic History   Marital status: Single    Spouse name: Not on file   Number of children: Not on file   Years of education: Not on file   Highest education level: Not on file  Occupational History   Not on file  Tobacco Use   Smoking status: Former    Packs/day: 0.50    Types: Cigarettes    Quit date: 08/22/2020    Years since quitting: 1.3   Smokeless tobacco: Never  Vaping Use   Vaping Use: Never used  Substance and Sexual Activity   Alcohol use: No   Drug use: Never   Sexual activity: Not Currently    Birth control/protection: Surgical  Other Topics Concern   Not on file  Social History Narrative   Not on file   Social Determinants of Health   Financial Resource Strain: Not on file  Food Insecurity: Not on file  Transportation Needs: Not on file  Physical Activity: Not on file  Stress: Not on file  Social Connections: Not on file  Intimate Partner Violence: Not on file     OBSERVATIONS/OBJECTIVE:  BP 124/79 (BP Location: Left Arm, Patient Position: Sitting)    Pulse 87    Temp 98.3 F (36.8 C) (Temporal)    Resp 17    Wt 193 lb 9.6 oz (87.8 kg)    SpO2 98%    BMI 34.29 kg/m  GENERAL: Patient is a well appearing female in no acute distress HEENT:  Sclerae anicteric.  Oropharynx clear and moist. No ulcerations or evidence of oropharyngeal candidiasis. Neck is supple.  NODES:  No cervical, supraclavicular, or axillary lymphadenopathy palpated.  BREAST EXAM: Right breast status postlumpectomy and radiation no sign of local recurrence; left breast benign LUNGS:  Clear to auscultation bilaterally.  No wheezes or rhonchi. HEART:  Regular rate and rhythm. No murmur appreciated. ABDOMEN:  Soft, nontender.   Positive, normoactive bowel sounds. No organomegaly palpated. MSK:  No focal spinal tenderness to palpation. Full range of motion bilaterally in the upper extremities. EXTREMITIES:  No peripheral edema.   SKIN:  Clear with no obvious rashes or skin changes. No nail dyscrasia. NEURO:  Nonfocal. Well oriented.  Appropriate affect.  LABORATORY DATA:  None for this visit.  DIAGNOSTIC IMAGING:  None for this visit.      ASSESSMENT AND PLAN:  Ms.. Monica [name] is a pleasant 51 y.o. 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  She presents to the Survivorship Clinic for  our initial meeting and routine follow-up post-completion of treatment for breast cancer.    1. Stage IA right breast cancer:  Ms. Lirette is continuing to recover from definitive treatment for breast cancer. She will follow-up with her medical oncologist, Dr. Lindi Adie in March 2023 with history and physical exam per surveillance protocol.  She will continue her anti-estrogen therapy with letrozole. Thus far, she is tolerating the moderately well, I did recommend that she change the time of day she takes the letrozole to see if that helps ameliorate her side effects.   Her mammogram is due September 2023. Today, a comprehensive survivorship care plan and treatment summary was reviewed with the patient today detailing her breast cancer diagnosis, treatment course, potential late/long-term effects of treatment, appropriate follow-up care with recommendations for the future, and patient education resources.  A copy of this summary, along with a letter will be sent to the patients primary care provider via mail/fax/In Basket message after todays visit.    2.  Hip and back pain: This is being followed by Dr. Georgina Snell.  I ordered her  repeat hip MRI that is following an indeterminate lesion in the femur to assess stability.  3. Bone health:   She was given education on specific activities to promote bone health.  4. Cancer screening:  Due to Ms. Tokarz's history and her age, she should receive screening for skin cancers, colon cancer, and gynecologic cancers.  The information and recommendations are listed on the patient's comprehensive care plan/treatment summary and were reviewed in detail with the patient.    5. Health maintenance and wellness promotion: Ms. Curless was encouraged to consume 5-7 servings of fruits and vegetables per day. We reviewed the "Nutrition Rainbow" handout.  She was also encouraged to engage in moderate to vigorous exercise for 30 minutes per day most days of the week. We discussed the [employer] fitness program, which is designed for cancer survivors to help them become more physically fit after cancer treatments.     6. Support services/counseling: It is not uncommon for this period of the patient's cancer care trajectory to be one of many emotions and stressors.   She was given information regarding our available services and encouraged to contact me with any questions or for help enrolling in any of our support group/programs.   7. Vaginal Dryness: I recommended that she use vitamin E suppositories that can be purchased from [employer].  We also discussed the use of coconut oil as a lubricant.  She plans on trying these things and will let us know if she is still struggling and we will place a referral to pelvic rehab.   Follow up instructions:    -Return to cancer center in March 2023 for follow-up with Dr. Lindi Adie -CT chest abdomen and pelvis as ordered by Dr. Lindi Adie -MRI of the left hip to follow indeterminate femur lesion. -Mammogram due in 08/2022 -Follow up with surgery September 2023 -She is welcome to return back to the Survivorship Clinic at any time; no additional follow-up needed at  this time.  -Consider referral back to survivorship as a long-term survivor for continued surveillance  The patient was provided an opportunity to ask questions and all were answered. The patient agreed with the plan and demonstrated an understanding of the instructions.   Total encounter time: 45 minutes in face-to-face visit time, chart review, lab review, care coordination, order entry, and documentation of the encounter.  Wilber Bihari, NP 01/06/22 9:29 AM Medical Oncology and Hematology  [facility] [place], [place] [address] Tel. [phone]    Fax. [phone]  *Total Encounter Time as defined by the Centers for Medicare and Medicaid Services includes, in addition to the face-to-face time of a patient visit (documented in the note above) non-face-to-face time: obtaining and reviewing outside history, ordering and reviewing medications, tests or procedures, care coordination (communications with other health care professionals or caregivers) and documentation in the medical record.

## 2022-01-07 ENCOUNTER — Ambulatory Visit (HOSPITAL_COMMUNITY): Payer: Medicaid Other | Attending: Hematology and Oncology

## 2022-01-07 ENCOUNTER — Encounter (HOSPITAL_COMMUNITY): Payer: Self-pay

## 2022-01-07 DIAGNOSIS — M25552 Pain in left hip: Secondary | ICD-10-CM | POA: Diagnosis present

## 2022-01-07 DIAGNOSIS — M545 Low back pain, unspecified: Secondary | ICD-10-CM | POA: Insufficient documentation

## 2022-01-07 DIAGNOSIS — R262 Difficulty in walking, not elsewhere classified: Secondary | ICD-10-CM | POA: Insufficient documentation

## 2022-01-07 DIAGNOSIS — M6281 Muscle weakness (generalized): Secondary | ICD-10-CM | POA: Insufficient documentation

## 2022-01-07 NOTE — Therapy (Signed)
[place], [place], [address] Phone: [phone]   Fax:  [phone]  Physical Therapy Treatment  Patient Details  Name: Monica [name] MRN: [id number] Date of Birth: 1971/01/10 Referring Provider (PT): Lynne Leader MD   Encounter Date: 01/07/2022   PT End of Session - 01/07/22 1005     Visit Number 7    Number of Visits 12    Date for PT Re-Evaluation 01/25/22    Authorization Type [place] Medicaid [employer]    Authorization Time Period 12 approved 1/10-3/10/23    Authorization - Visit Number 7    Authorization - Number of Visits 12    Progress Note Due on Visit 10    PT Start Time 2836    PT Stop Time 6294    PT Time Calculation (min) 43 min    Activity Tolerance Patient tolerated treatment well    Behavior During Therapy [facility] for tasks assessed/performed             Past Medical History:  Diagnosis Date   Asthma    Breast cancer ([place])    Family history of breast cancer 09/22/2020   Hypertension    Personal history of chemotherapy    Personal history of radiation therapy     Past Surgical History:  Procedure Laterality Date   ABDOMINAL HYSTERECTOMY     APPENDECTOMY     BREAST LUMPECTOMY WITH RADIOACTIVE SEED AND SENTINEL LYMPH NODE BIOPSY Right 09/10/2020   Procedure: RIGHT BREAST LUMPECTOMY WITH RADIOACTIVE SEED AND SENTINEL [place];  Surgeon: Erroll Luna, MD;  Location: [place];  Service: General;  Laterality: Right;   PORTACATH PLACEMENT Right 10/16/2020   Procedure: INSERTION PORT-A-CATH WITH ULTRASOUND GUIDANCE;  Surgeon: Erroll Luna, MD;  Location: [place];  Service: General;  Laterality: Right;   RE-EXCISION OF BREAST LUMPECTOMY Right 10/16/2020   Procedure: RE-EXCISION OF RIGHT BREAST LUMPECTOMY;  Surgeon: Erroll Luna, MD;  Location: [place];  Service: General;  Laterality: Right;    There were no vitals filed  for this visit.   Subjective Assessment - 01/07/22 1001     Subjective Pt reports pain reduced and radicular symptoms ending mid thigh today.  Stated she is to receive shot in back tomorrow.    Pertinent History Breast CA with one more chemo session    Patient Stated Goals Decrease pain    Currently in Pain? Yes    Pain Score 6     Pain Location Leg    Pain Orientation Left;Proximal;Posterior    Pain Descriptors / Indicators Sharp    Pain Type Chronic pain    Pain Radiating Towards posterior mid thigh (left foot)    Pain Onset More than a month ago    Pain Frequency Constant    Aggravating Factors  sitting, standing, walking    Pain Relieving Factors meds    Effect of Pain on Daily Activities limits                [facility] PT Assessment - 01/07/22 0001       Assessment   Medical Diagnosis LT hip pain/ bursitis    Referring Provider (PT) Lynne Leader MD    Next MD Visit [facility] image center for shot 01/08/22                           [facility] Adult PT Treatment/Exercise - 01/07/22 0001  Lumbar Exercises: Stretches   Standing Extension 10 reps;5 seconds    Standing Extension Limitations 3D hip excursion    Prone on Elbows Stretch Limitations 2 min    Press Ups 10 reps;10 seconds    Other Lumbar Stretch Exercise Child's pose 3x30"    Other Lumbar Stretch Exercise 3D hip excursion      Lumbar Exercises: Standing   Functional Squats 10 reps    Functional Squats Limitations 3D hip excursion, squats in front of mat      Lumbar Exercises: Supine   Dead Bug 10 reps;3 seconds    Bridge 20 reps      Lumbar Exercises: Prone   Straight Leg Raise 5 reps;3 seconds                       PT Short Term Goals - 12/28/21 1240       PT SHORT TERM GOAL #1   Title Patient will be independent with initial HEP and self-management strategies to improve functional outcomes    Time 3    Period Weeks    Status On-going    Target Date 01/04/22                PT Long Term Goals - 12/28/21 1240       PT LONG TERM GOAL #1   Title Patient will be independent with advanced HEP and self-management strategies to improve functional outcomes    Time 6    Period Weeks    Status On-going    Target Date 01/25/22      PT LONG TERM GOAL #2   Title Patient will have equal to or > 4/5 MMT throughout BLE to improve ability to perform functional mobility, stair ambulation and ADLs.    Time 6    Period Weeks    Status On-going    Target Date 01/25/22      PT LONG TERM GOAL #3   Title Patient will report at least 65% overall improvement in subjective complaint to indicate improvement in ability to perform ADLs.    Time 6    Period Weeks    Status On-going    Target Date 01/25/22                   Plan - 01/07/22 1010     Clinical Impression Statement Reports of improved centralization with radicular symtpoms only to mid thigh at entrance this session.  Continued with extension based exercises.  Progressed gluteal strengthen with additional functional squat with cueing for form/mechanics.  Continued stretches for mobility.  Attempted manual for mobility, pt guarded with palpation and reports of increased radicular symptoms to foot.  Symptoms resolved by EOS.    Personal Factors and Comorbidities Time since onset of injury/illness/exacerbation    Comorbidities Ca    Examination-Activity Limitations Bend;Carry;Stairs;Stand;Locomotion Level;Transfers    Examination-Participation Restrictions Shop;Community Activity;Cleaning    Stability/Clinical Decision Making Stable/Uncomplicated    Clinical Decision Making Low    Rehab Potential Good    PT Frequency 2x / week    PT Duration 6 weeks    PT Treatment/Interventions ADLs/Self Care Home Management;DME Instruction;Gait training;Stair training;Functional mobility training;Therapeutic activities;Therapeutic exercise;Balance training;Patient/family education;Manual techniques;Energy  conservation;Dry needling;Ultrasound;Taping;Splinting;Orthotic Fit/Training;Neuromuscular re-education;Cognitive remediation;Passive range of motion;Spinal Manipulations    PT Next Visit Plan F/U following shots on 01/08/22.  Progress hip and core strength as able. Continue extension based exercises as indicated. Progress to standing    PT Home Exercise Plan  Eval: ab brace, prone on elbows, bridge 1/17 ab march, hip flexion iso; 1/19: prone heel squeeze, SLR, quadruped UE; 1/23 - prone, prone on wedge, prone press up (from wedge) 1/25 LTR, clamshell; 1/31: bridge, piriformis stretch and 3D hip excursion. 2/2: prone hip extension and child's pose    Consulted and Agree with Plan of Care Patient             Patient will benefit from skilled therapeutic intervention in order to improve the following deficits and impairments:  Abnormal gait, Decreased activity tolerance, Decreased balance, Decreased strength, Difficulty walking, Impaired perceived functional ability, Pain, Improper body mechanics, Decreased range of motion  Visit Diagnosis: Low back pain, unspecified back pain laterality, unspecified chronicity, unspecified whether sciatica present  Muscle weakness (generalized)  Difficulty in walking, not elsewhere classified  Pain in left hip     Problem List Patient Active Problem List   Diagnosis Date Noted   Spondylolisthesis at L5-S1 level 01/04/2022   Lumbar radiculopathy 01/04/2022   Trochanteric bursitis of left hip 11/10/2021   Muscle cramp, nocturnal 11/10/2021   Port-A-Cath in place 12/29/2020   Genetic testing 10/08/2020   Family history of breast cancer 09/22/2020   Primary malignant neoplasm of upper outer quadrant of female breast, right ([place]) 08/27/2020   Ihor Austin, LPTA/CLT; CBIS [phone]  Aldona Lento, PTA 01/07/2022, 1:05 PM  [place], [place], [address] Phone: [phone]    Fax:  [phone]  Name: Lyndon Chenoweth MRN: [id number] Date of Birth: 04-Nov-1971

## 2022-01-08 ENCOUNTER — Other Ambulatory Visit: Payer: Self-pay

## 2022-01-08 ENCOUNTER — Ambulatory Visit
Admission: RE | Admit: 2022-01-08 | Discharge: 2022-01-08 | Disposition: A | Discharge status: Home / Self Care | Payer: Medicaid Other | Source: Ambulatory Visit | Attending: Family Medicine | Admitting: Family Medicine

## 2022-01-08 ENCOUNTER — Inpatient Hospital Stay: Admission: RE | Admit: 2022-01-08 | Payer: Medicaid Other | Source: Ambulatory Visit

## 2022-01-08 DIAGNOSIS — M5416 Radiculopathy, lumbar region: Secondary | ICD-10-CM

## 2022-01-08 IMAGING — XA Imaging study
2 series · 2 of 2 positions shown · non-contrast
Comparison: none

CLINICAL DATA: Lumbosacral spondylosis without myelopathy with
radiculopathy. Predominantly left buttock and leg pain with
occasional right leg pain. Chronic L5 pars defects with
spondylolisthesis and bilateral L5-S1 neuroforaminal stenosis. No
prior injections or surgery.

[Series 1: ortho standard · 1 of 1 slices shown (1 of 2)]
[im 1/1]
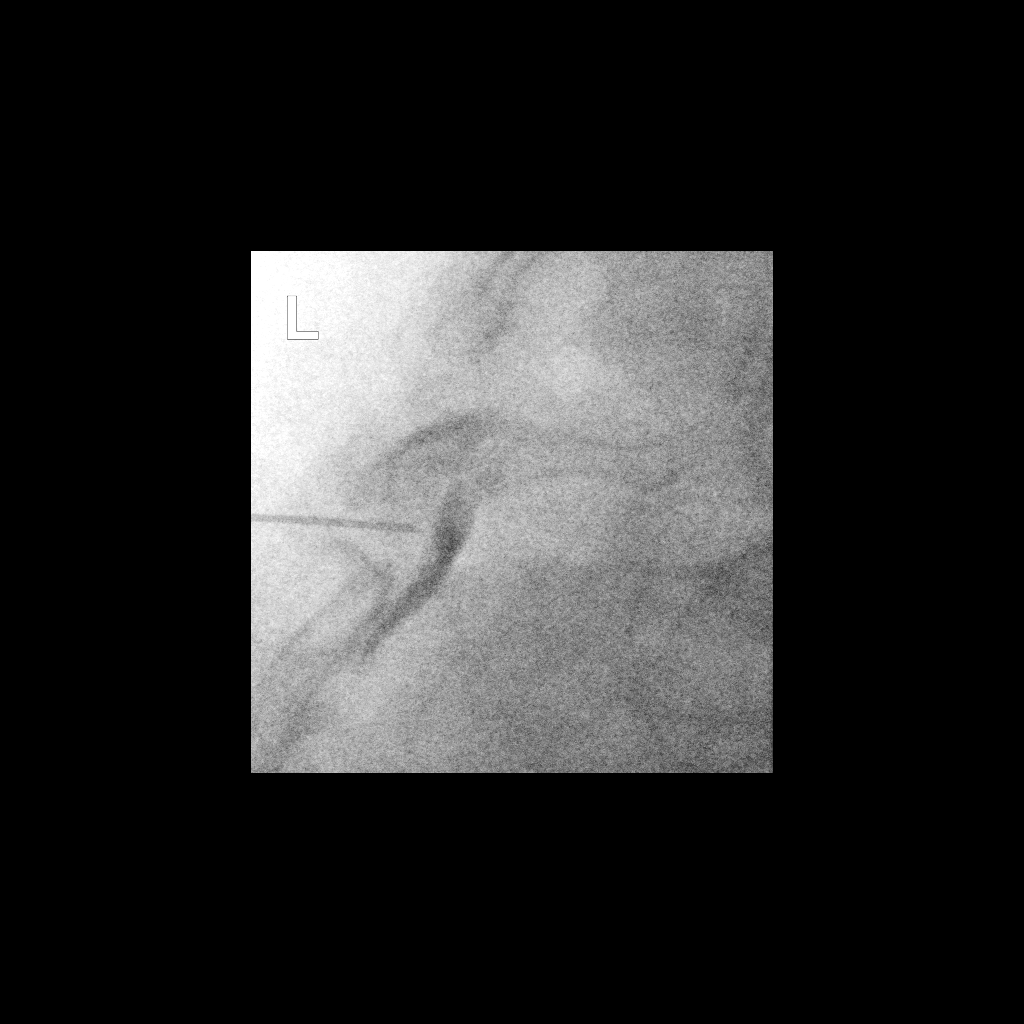

[Series 2: ortho standard · 1 of 1 slices shown (2 of 2)]
[im 1/1]
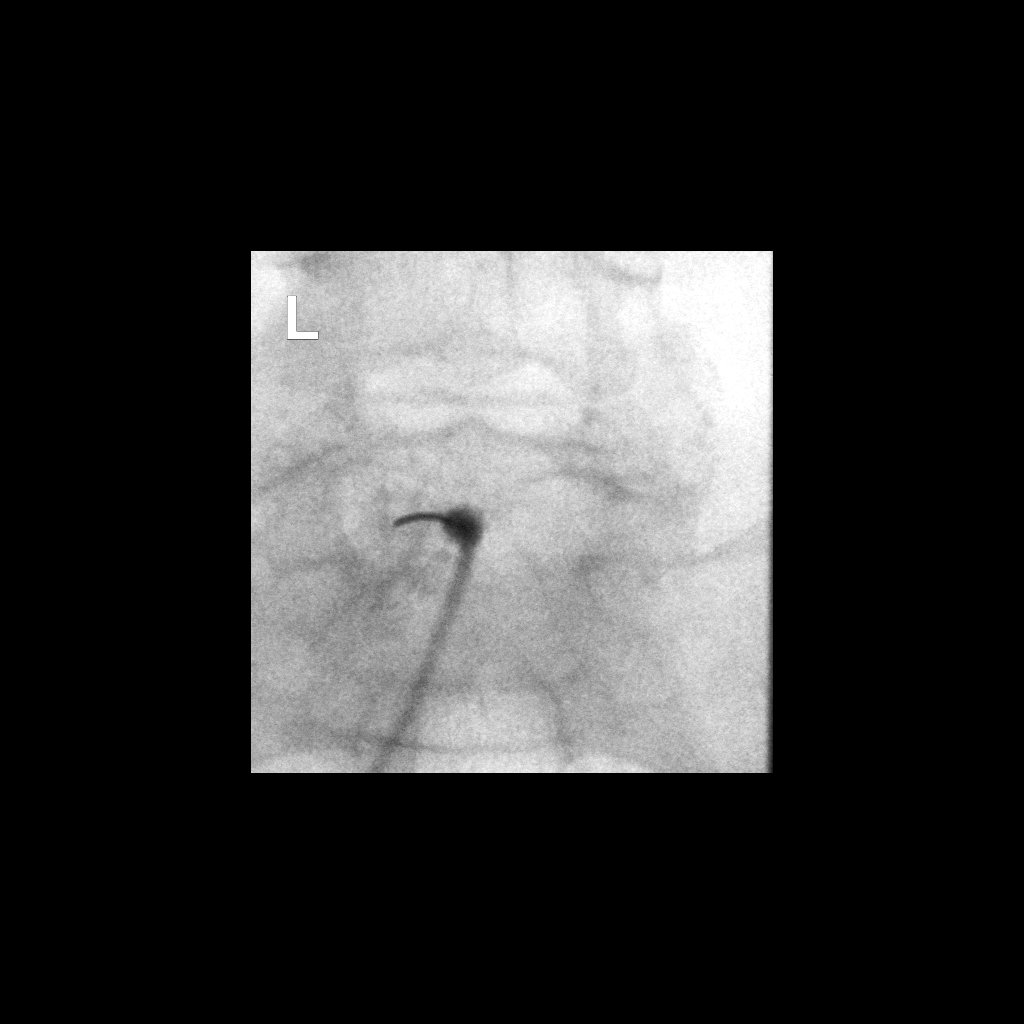

[2 of 2 positions shown; findings below may reference images not displayed]

FLUOROSCOPY:
Radiation Exposure Index (as provided by the fluoroscopic device):
2.5 mGy [place]

PROCEDURE:
The procedure, risks, benefits, and alternatives were explained to
the patient. Questions regarding the procedure were encouraged and
answered. The patient understands and consents to the procedure.

LUMBAR EPIDURAL INJECTION:

An interlaminar approach was performed on the left at L5-S1. The
overlying skin was cleansed and anesthetized. A 3.5 inch 20 gauge
epidural needle was advanced using loss-of-resistance technique.

DIAGNOSTIC EPIDURAL INJECTION:

Injection of Isovue-M 200 shows a good epidural pattern with spread
above and below the level of needle placement, primarily on the
left. No vascular opacification is seen.

THERAPEUTIC EPIDURAL INJECTION:

80 mg of Depo-Medrol mixed with 3 mL of 1% lidocaine were instilled.
The procedure was well-tolerated, and the patient was discharged
thirty minutes following the injection in good condition.

COMPLICATIONS:
None immediate.
IMPRESSION: Technically successful interlaminar epidural injection on the left
at L5-S1.

## 2022-01-08 MED ORDER — METHYLPREDNISOLONE ACETATE 40 MG/ML INJ SUSP (RADIOLOG
80.0000 mg | Freq: Once | INTRAMUSCULAR | Status: AC
  Administered 2022-01-08: 80 mg via EPIDURAL

## 2022-01-08 MED ORDER — IOPAMIDOL (ISOVUE-M 200) INJECTION 41%
1.0000 mL | Freq: Once | INTRAMUSCULAR | Status: AC
  Administered 2022-01-08: 1 mL via EPIDURAL

## 2022-01-08 NOTE — Discharge Instructions (Signed)

## 2022-01-11 ENCOUNTER — Encounter (HOSPITAL_COMMUNITY): Payer: Self-pay | Admitting: Physical Therapy

## 2022-01-11 ENCOUNTER — Ambulatory Visit (HOSPITAL_COMMUNITY): Payer: Medicaid Other | Admitting: Physical Therapy

## 2022-01-11 ENCOUNTER — Telehealth: Payer: Self-pay

## 2022-01-11 ENCOUNTER — Other Ambulatory Visit: Payer: Self-pay

## 2022-01-11 DIAGNOSIS — M6281 Muscle weakness (generalized): Secondary | ICD-10-CM

## 2022-01-11 DIAGNOSIS — R262 Difficulty in walking, not elsewhere classified: Secondary | ICD-10-CM

## 2022-01-11 DIAGNOSIS — M545 Low back pain, unspecified: Secondary | ICD-10-CM

## 2022-01-11 DIAGNOSIS — M25552 Pain in left hip: Secondary | ICD-10-CM

## 2022-01-11 MED ORDER — HYDROCODONE-ACETAMINOPHEN 5-325 MG PO TABS: 1.0000 | ORAL_TABLET | Freq: Four times a day (QID) | ORAL | 0 refills | Status: DC | PRN

## 2022-01-11 NOTE — Telephone Encounter (Signed)
Patient called stating she was having a hard time getting her hydrocodone filled from her pharmacy and was told to have our office send in another refill so it can be filled. I tried to find when it was filled recently but couldn't, I did see a note that it was going to be sent. Could patient get this refilled to [facility] and a call back when that is done.

## 2022-01-11 NOTE — Telephone Encounter (Signed)
Hydrocodone refilled.  

## 2022-01-11 NOTE — Telephone Encounter (Signed)
Called pt and informed her that her hydrocodone rx was ordered by Dr. Georgina Snell to [place].  She verbalizes understanding.

## 2022-01-11 NOTE — Therapy (Signed)
[facility] [address] [place], [place], [address] Phone: [phone]   Fax:  [phone]  Physical Therapy Treatment  Patient Details  Name: Monica [name] MRN: [id number] Date of Birth: [birth year] Referring Provider (PT): Lynne Leader MD   Encounter Date: 01/11/2022   PT End of Session - 01/11/22 0907     Visit Number 8    Number of Visits 12    Date for PT Re-Evaluation 01/25/22    Authorization Type [place] Medicaid [employer]    Authorization Time Period 12 approved 1/10-3/10/23    Authorization - Visit Number 8    Authorization - Number of Visits 12    Progress Note Due on Visit 10    PT Start Time 0902    PT Stop Time 0940    PT Time Calculation (min) 38 min    Activity Tolerance Patient tolerated treatment well;Patient limited by pain    Behavior During Therapy [facility] for tasks assessed/performed             Past Medical History:  Diagnosis Date   Asthma    Breast cancer ([place])    Family history of breast cancer 09/22/2020   Hypertension    Personal history of chemotherapy    Personal history of radiation therapy     Past Surgical History:  Procedure Laterality Date   ABDOMINAL HYSTERECTOMY     APPENDECTOMY     BREAST LUMPECTOMY WITH RADIOACTIVE SEED AND SENTINEL LYMPH NODE BIOPSY Right 09/10/2020   Procedure: RIGHT BREAST LUMPECTOMY WITH RADIOACTIVE SEED AND SENTINEL [place];  Surgeon: Erroll Luna, MD;  Location: [place];  Service: General;  Laterality: Right;   PORTACATH PLACEMENT Right 10/16/2020   Procedure: INSERTION PORT-A-CATH WITH ULTRASOUND GUIDANCE;  Surgeon: Erroll Luna, MD;  Location: [place];  Service: General;  Laterality: Right;   RE-EXCISION OF BREAST LUMPECTOMY Right 10/16/2020   Procedure: RE-EXCISION OF RIGHT BREAST LUMPECTOMY;  Surgeon: Erroll Luna, MD;  Location: [place];  Service: General;  Laterality: Right;     There were no vitals filed for this visit.   Subjective Assessment - 01/11/22 0906     Subjective Patient states she got a shot on Friday and it didnt help. Still has radiating pain in legs. About an 8 right now.    Pertinent History Breast CA with one more chemo session    Patient Stated Goals Decrease pain    Currently in Pain? Yes    Pain Score 8     Pain Location Leg    Pain Orientation Left    Pain Descriptors / Indicators Shooting;Sharp    Pain Type Chronic pain    Pain Onset More than a month ago    Pain Frequency Constant                               [facility] Adult PT Treatment/Exercise - 01/11/22 0001       Lumbar Exercises: Stretches   Lower Trunk Rotation 5 reps;10 seconds    Press Ups 10 reps    Other Lumbar Stretch Exercise POE 3 minutes (      Lumbar Exercises: Standing   Other Standing Lumbar Exercises Repeated extension in standing 2 x 10      Lumbar Exercises: Seated   Other Seated Lumbar Exercises sciatic nerve glides 2 x 10      Lumbar Exercises: Supine  Ab Set 10 reps    Pelvic Tilt 10 reps;5 seconds    Bent Knee Raise 20 reps    Bridge 20 reps      Lumbar Exercises: Sidelying   Clam --                       PT Short Term Goals - 12/28/21 1240       PT SHORT TERM GOAL #1   Title Patient will be independent with initial HEP and self-management strategies to improve functional outcomes    Time 3    Period Weeks    Status On-going    Target Date 01/04/22               PT Long Term Goals - 12/28/21 1240       PT LONG TERM GOAL #1   Title Patient will be independent with advanced HEP and self-management strategies to improve functional outcomes    Time 6    Period Weeks    Status On-going    Target Date 01/25/22      PT LONG TERM GOAL #2   Title Patient will have equal to or > 4/5 MMT throughout BLE to improve ability to perform functional mobility, stair ambulation and ADLs.    Time 6     Period Weeks    Status On-going    Target Date 01/25/22      PT LONG TERM GOAL #3   Title Patient will report at least 65% overall improvement in subjective complaint to indicate improvement in ability to perform ADLs.    Time 6    Period Weeks    Status On-going    Target Date 01/25/22                   Plan - 01/11/22 5462     Clinical Impression Statement Patient continues to have LBP limiting activity tolerance and positioning. Today she does not tolerate laying as well as previously. Prone lumbar extension does not appear to decrease pain as it had previously. Gentle lumbar flexion with pelvic tilts also does not reduce pain. Patient did tolerate lumbar extension in standing well and this reduced pain from 8/10 to 6/10. Added seated sciatic nerve glides, but with limited effect. NO further decrease in pain, though educated patient on purpose and function. Instructed patient to continue core strength isometrics and increase frequency of lumbar extension in standing as able throughout the day for decreased back pain. Will assess response next visit.    Personal Factors and Comorbidities Time since onset of injury/illness/exacerbation    Comorbidities Ca    Examination-Activity Limitations Bend;Carry;Stairs;Stand;Locomotion Level;Transfers    Examination-Participation Restrictions Shop;Community Activity;Cleaning    Stability/Clinical Decision Making Stable/Uncomplicated    Rehab Potential Good    PT Frequency 2x / week    PT Duration 6 weeks    PT Treatment/Interventions ADLs/Self Care Home Management;DME Instruction;Gait training;Stair training;Functional mobility training;Therapeutic activities;Therapeutic exercise;Balance training;Patient/family education;Manual techniques;Energy conservation;Dry needling;Ultrasound;Taping;Splinting;Orthotic Fit/Training;Neuromuscular re-education;Cognitive remediation;Passive range of motion;Spinal Manipulations    PT Next Visit Plan Progress  hip and core strength as able. Continue extension based exercises as indicated. F/U about REIS    PT Home Exercise Plan Eval: ab brace, prone on elbows, bridge 1/17 ab march, hip flexion iso; 1/19: prone heel squeeze, SLR, quadruped UE; 1/23 - prone, prone on wedge, prone press up (from wedge) 1/25 LTR, clamshell; 1/31: bridge, piriformis stretch and 3D hip excursion. 2/2: prone hip extension and  child's pose    Consulted and Agree with Plan of Care Patient             Patient will benefit from skilled therapeutic intervention in order to improve the following deficits and impairments:  Abnormal gait, Decreased activity tolerance, Decreased balance, Decreased strength, Difficulty walking, Impaired perceived functional ability, Pain, Improper body mechanics, Decreased range of motion  Visit Diagnosis: Low back pain, unspecified back pain laterality, unspecified chronicity, unspecified whether sciatica present  Muscle weakness (generalized)  Pain in left hip  Difficulty in walking, not elsewhere classified     Problem List Patient Active Problem List   Diagnosis Date Noted   Spondylolisthesis at L5-S1 level 01/04/2022   Lumbar radiculopathy 01/04/2022   Trochanteric bursitis of left hip 11/10/2021   Muscle cramp, nocturnal 11/10/2021   Port-A-Cath in place 12/29/2020   Genetic testing 10/08/2020   Family history of breast cancer 09/22/2020   Primary malignant neoplasm of upper outer quadrant of female breast, right ([place]) 08/27/2020   9:39 AM, 01/11/22 Josue Hector PT DPT  Physical Therapist with [facility]  [phone] [facility] [address] [place], [place], [address] Phone: [phone]   Fax:  [phone]  Name: Monica [name] MRN: [id number] Date of Birth: [birth year]

## 2022-01-13 ENCOUNTER — Other Ambulatory Visit: Payer: Self-pay

## 2022-01-13 ENCOUNTER — Ambulatory Visit (HOSPITAL_COMMUNITY): Payer: Medicaid Other

## 2022-01-13 ENCOUNTER — Encounter (HOSPITAL_COMMUNITY): Payer: Self-pay

## 2022-01-13 DIAGNOSIS — M545 Low back pain, unspecified: Secondary | ICD-10-CM

## 2022-01-13 DIAGNOSIS — M6281 Muscle weakness (generalized): Secondary | ICD-10-CM

## 2022-01-13 DIAGNOSIS — R262 Difficulty in walking, not elsewhere classified: Secondary | ICD-10-CM

## 2022-01-13 DIAGNOSIS — M25552 Pain in left hip: Secondary | ICD-10-CM

## 2022-01-13 NOTE — Therapy (Signed)
[facility] [place], [place], [address] Phone: [phone]   Fax:  [phone]  Physical Therapy Treatment  Patient Details  Name: Monica [name] MRN: [id number] Date of Birth: 07/22/1971 Referring Provider (PT): Lynne Leader MD   Encounter Date: 01/13/2022   PT End of Session - 01/13/22 1016     Visit Number 9    Number of Visits 12    Date for PT Re-Evaluation 01/25/22    Authorization Type [place] Medicaid Wellcare    Authorization Time Period 12 approved 1/10-3/10/23    Authorization - Visit Number 9    Authorization - Number of Visits 12    Progress Note Due on Visit 10    PT Start Time 1000    PT Stop Time 1040    PT Time Calculation (min) 40 min    Activity Tolerance Patient tolerated treatment well;No increased pain    Behavior During Therapy WFL for tasks assessed/performed             Past Medical History:  Diagnosis Date   Asthma    Breast cancer ([place])    Family history of breast cancer 09/22/2020   Hypertension    Personal history of chemotherapy    Personal history of radiation therapy     Past Surgical History:  Procedure Laterality Date   ABDOMINAL HYSTERECTOMY     APPENDECTOMY     BREAST LUMPECTOMY WITH RADIOACTIVE SEED AND SENTINEL LYMPH NODE BIOPSY Right 09/10/2020   Procedure: RIGHT BREAST LUMPECTOMY WITH RADIOACTIVE SEED AND SENTINEL LYMPH NODE MAPPING;  Surgeon: Erroll Luna, MD;  Location: [facility];  Service: General;  Laterality: Right;   PORTACATH PLACEMENT Right 10/16/2020   Procedure: INSERTION PORT-A-CATH WITH ULTRASOUND GUIDANCE;  Surgeon: Erroll Luna, MD;  Location: [place];  Service: General;  Laterality: Right;   RE-EXCISION OF BREAST LUMPECTOMY Right 10/16/2020   Procedure: RE-EXCISION OF RIGHT BREAST LUMPECTOMY;  Surgeon: Erroll Luna, MD;  Location: [place];  Service: General;  Laterality: Right;    There  were no vitals filed for this visit.   Subjective Assessment - 01/13/22 1010     Subjective Pt stated she is numb down to Lt mid calf today.  Reports increased walking in the mall yesterday with increased pain following.  Pain scale 8/10.  Reports her butt bones feel like she's sitting on cement.    Pertinent History Breast CA with one more chemo session    Patient Stated Goals Decrease pain    Currently in Pain? Yes    Pain Score 8     Pain Location Leg    Pain Orientation Left    Pain Descriptors / Indicators Numbness                [facility] PT Assessment - 01/13/22 0001       Assessment   Medical Diagnosis LT hip pain/ bursitis    Referring Provider (PT) Lynne Leader MD    Prior Therapy Yes                           [facility] Adult PT Treatment/Exercise - 01/13/22 0001       Lumbar Exercises: Stretches   Standing Extension 10 reps;5 seconds    Standing Extension Limitations 3D hip excursion    Prone on Elbows Stretch Limitations 3 min    Press Ups 10 reps  Lumbar Exercises: Standing   Functional Squats 10 reps    Functional Squats Limitations 3D hip excursion, squats in front of mat    Forward Lunge 10 reps    Forward Lunge Limitations onto 6in step    Row 10 reps;Theraband    Theraband Level (Row) Level 2 (Red)    Row Limitations cueingfor ab set to address lordosis and tactile cueing to relax UT.    Shoulder Extension 10 reps    Theraband Level (Shoulder Extension) Level 2 (Red)    Shoulder Extension Limitations cueingfor ab set to address lordosis and tactile cueing to relax UT.      Lumbar Exercises: Prone   Single Arm Raise 10 reps;3 seconds    Straight Leg Raise 10 reps;3 seconds    Opposite Arm/Leg Raise Right arm/Left leg;Left arm/Right leg;5 reps;3 seconds                       PT Short Term Goals - 12/28/21 1240       PT SHORT TERM GOAL #1   Title Patient will be independent with initial HEP and self-management  strategies to improve functional outcomes    Time 3    Period Weeks    Status On-going    Target Date 01/04/22               PT Long Term Goals - 12/28/21 1240       PT LONG TERM GOAL #1   Title Patient will be independent with advanced HEP and self-management strategies to improve functional outcomes    Time 6    Period Weeks    Status On-going    Target Date 01/25/22      PT LONG TERM GOAL #2   Title Patient will have equal to or > 4/5 MMT throughout BLE to improve ability to perform functional mobility, stair ambulation and ADLs.    Time 6    Period Weeks    Status On-going    Target Date 01/25/22      PT LONG TERM GOAL #3   Title Patient will report at least 65% overall improvement in subjective complaint to indicate improvement in ability to perform ADLs.    Time 6    Period Weeks    Status On-going    Target Date 01/25/22                   Plan - 01/13/22 1017     Clinical Impression Statement Continued extension based exercises with reports of radicular symptoms resolved in prone and standing extension based exercises.  Progressed postural and gluteal strengthening this session that was tolerated well.  Able to progress to standing forward lunges and began postural strnegthening with theraband.  Pt required multimodal cueing to improve scapular retraction and reduce UT and lumbar lordosis.  No reports of pain at EOS.  Pt. reports decreased tolerance with seated position.    Personal Factors and Comorbidities Time since onset of injury/illness/exacerbation    Comorbidities Ca    Examination-Activity Limitations Bend;Carry;Stairs;Stand;Locomotion Level;Transfers    Examination-Participation Restrictions Shop;Community Activity;Cleaning    Stability/Clinical Decision Making Stable/Uncomplicated    Clinical Decision Making Low    Rehab Potential Good    PT Frequency 2x / week    PT Duration 6 weeks    PT Treatment/Interventions ADLs/Self Care Home  Management;DME Instruction;Gait training;Stair training;Functional mobility training;Therapeutic activities;Therapeutic exercise;Balance training;Patient/family education;Manual techniques;Energy conservation;Dry needling;Ultrasound;Taping;Splinting;Orthotic Fit/Training;Neuromuscular re-education;Cognitive remediation;Passive range of motion;Spinal Manipulations  PT Next Visit Plan 10th visit progress note next session.  Progess hip and core strength as able. Continue extension based exercises as indicated. F/U about REIS    PT Home Exercise Plan Eval: ab brace, prone on elbows, bridge 1/17 ab march, hip flexion iso; 1/19: prone heel squeeze, SLR, quadruped UE; 1/23 - prone, prone on wedge, prone press up (from wedge) 1/25 LTR, clamshell; 1/31: bridge, piriformis stretch and 3D hip excursion. 2/2: prone hip extension and child's pose    Consulted and Agree with Plan of Care Patient             Patient will benefit from skilled therapeutic intervention in order to improve the following deficits and impairments:  Abnormal gait, Decreased activity tolerance, Decreased balance, Decreased strength, Difficulty walking, Impaired perceived functional ability, Pain, Improper body mechanics, Decreased range of motion  Visit Diagnosis: Low back pain, unspecified back pain laterality, unspecified chronicity, unspecified whether sciatica present  Muscle weakness (generalized)  Pain in left hip  Difficulty in walking, not elsewhere classified     Problem List Patient Active Problem List   Diagnosis Date Noted   Spondylolisthesis at L5-S1 level 01/04/2022   Lumbar radiculopathy 01/04/2022   Trochanteric bursitis of left hip 11/10/2021   Muscle cramp, nocturnal 11/10/2021   Port-A-Cath in place 12/29/2020   Genetic testing 10/08/2020   Family history of breast cancer 09/22/2020   Primary malignant neoplasm of upper outer quadrant of female breast, right ([place]) 08/27/2020   Ihor Austin,  LPTA/CLT; CBIS [phone]  Aldona Lento, PTA 01/13/2022, 10:58 AM  [facility] [address] [place], [place], [address] Phone: [phone]   Fax:  [phone]  Name: Ellaina Schuler MRN: [id number] Date of Birth: 10-25-1971

## 2022-01-14 ENCOUNTER — Encounter (HOSPITAL_COMMUNITY): Payer: Self-pay

## 2022-01-18 ENCOUNTER — Encounter (HOSPITAL_COMMUNITY): Payer: Medicaid Other

## 2022-01-19 ENCOUNTER — Ambulatory Visit (HOSPITAL_COMMUNITY): Payer: Medicaid Other | Admitting: Physical Therapy

## 2022-01-19 ENCOUNTER — Other Ambulatory Visit: Payer: Self-pay

## 2022-01-19 ENCOUNTER — Encounter (HOSPITAL_COMMUNITY): Payer: Self-pay | Admitting: Physical Therapy

## 2022-01-19 DIAGNOSIS — R262 Difficulty in walking, not elsewhere classified: Secondary | ICD-10-CM

## 2022-01-19 DIAGNOSIS — M545 Low back pain, unspecified: Secondary | ICD-10-CM

## 2022-01-19 DIAGNOSIS — M25552 Pain in left hip: Secondary | ICD-10-CM

## 2022-01-19 DIAGNOSIS — M6281 Muscle weakness (generalized): Secondary | ICD-10-CM

## 2022-01-19 NOTE — Therapy (Signed)
[facility] [address] [place], [place], [address] Phone: [phone]   Fax:  [phone]  Physical Therapy Treatment  Patient Details  Name: Monica [name] MRN: [id number] Date of Birth: 1971/12/05 Referring Provider (PT): Lynne Leader MD  PHYSICAL THERAPY DISCHARGE SUMMARY  Visits from Start of Care: 10  Current functional level related to goals / functional outcomes: See below    Remaining deficits: See below    Education / Equipment: See assessment   Patient agrees to discharge. Patient goals were not met. Patient is being discharged due to lack of progress.  Encounter Date: 01/19/2022   PT End of Session - 01/19/22 0936     Visit Number 10    Number of Visits 12    Date for PT Re-Evaluation 01/25/22    Authorization Type [place] Medicaid [employer]    Authorization Time Period 12 approved 1/10-3/10/23    Authorization - Visit Number 10    Authorization - Number of Visits 12    PT Start Time [phone]    PT Stop Time 1023    PT Time Calculation (min) 34 min    Activity Tolerance Patient tolerated treatment well    Behavior During Therapy WFL for tasks assessed/performed             Past Medical History:  Diagnosis Date   Asthma    Breast cancer ([place])    Family history of breast cancer 09/22/2020   Hypertension    Personal history of chemotherapy    Personal history of radiation therapy     Past Surgical History:  Procedure Laterality Date   ABDOMINAL HYSTERECTOMY     APPENDECTOMY     BREAST LUMPECTOMY WITH RADIOACTIVE SEED AND SENTINEL LYMPH NODE BIOPSY Right 09/10/2020   Procedure: RIGHT BREAST LUMPECTOMY WITH RADIOACTIVE SEED AND SENTINEL LYMPH NODE MAPPING;  Surgeon: Erroll Luna, MD;  Location: [facility];  Service: General;  Laterality: Right;   PORTACATH PLACEMENT Right 10/16/2020   Procedure: INSERTION PORT-A-CATH WITH ULTRASOUND GUIDANCE;  Surgeon: Erroll Luna, MD;  Location:  [place];  Service: General;  Laterality: Right;   RE-EXCISION OF BREAST LUMPECTOMY Right 10/16/2020   Procedure: RE-EXCISION OF RIGHT BREAST LUMPECTOMY;  Surgeon: Erroll Luna, MD;  Location: [facility];  Service: General;  Laterality: Right;    There were no vitals filed for this visit.   Subjective Assessment - 01/19/22 0953     Subjective Patient reports limited progress. She says she is still not able to stand long. She also notes her recent injection did not work. She admits noncompliance with HEP. She reports 40% improvement overall.    Currently in Pain? Yes    Pain Score 8     Pain Location Leg    Pain Orientation Left    Pain Descriptors / Indicators Cramping;Numbness    Pain Type Chronic pain                [facility] PT Assessment - 01/19/22 0001       Assessment   Medical Diagnosis LT hip pain/ bursitis    Referring Provider (PT) Lynne Leader MD    Prior Therapy Yes      Precautions   Precautions None      Restrictions   Weight Bearing Restrictions No      Balance Screen   Has the patient fallen in the past 6 months No      Home Environment   Living  Environment Private residence      Cognition   Overall Cognitive Status Within Functional Limits for tasks assessed      AROM   Lumbar Flexion 60% limited   no change   Lumbar Extension 25% limited   was 50%   Lumbar - Right Side Bend WNL   was 25% limited   Lumbar - Left Side Bend WNL   was 25% limited     Strength   Right Hip Flexion 4-/5   was 4   Right Hip Extension 3+/5   was 3   Right Hip ABduction 3-/5   no change   Left Hip Flexion 3-/5   no change   Left Hip Extension 3-/5   no change   Left Hip ABduction 3-/5   no change   Right Knee Extension 4-/5   no change   Left Knee Extension 3-/5   no change   Right Ankle Dorsiflexion 3+/5   no change   Left Ankle Dorsiflexion 3-/5   no change                          [facility] Adult PT  Treatment/Exercise - 01/19/22 0001       Lumbar Exercises: Stretches   Prone on Elbows Stretch 3 reps;60 seconds    Press Ups 10 reps      Lumbar Exercises: Standing   Other Standing Lumbar Exercises Repeated extension in standing 2 x 10      Lumbar Exercises: Supine   Bent Knee Raise 20 reps    Bridge 15 reps                       PT Short Term Goals - 01/19/22 0955       PT SHORT TERM GOAL #1   Title Patient will be independent with initial HEP and self-management strategies to improve functional outcomes    Baseline Denies compliance    Time 3    Period Weeks    Status Not Met    Target Date 01/04/22               PT Long Term Goals - 01/19/22 0956       PT LONG TERM GOAL #1   Title Patient will be independent with advanced HEP and self-management strategies to improve functional outcomes    Baseline Reviewed and answered patient questions    Time 6    Period Weeks    Status Achieved    Target Date 01/25/22      PT LONG TERM GOAL #2   Title Patient will have equal to or > 4/5 MMT throughout BLE to improve ability to perform functional mobility, stair ambulation and ADLs.    Baseline See MMT    Time 6    Period Weeks    Status Not Met    Target Date 01/25/22      PT LONG TERM GOAL #3   Title Patient will report at least 65% overall improvement in subjective complaint to indicate improvement in ability to perform ADLs.    Baseline Reports 40%    Time 6    Period Weeks    Status Not Met    Target Date 01/25/22                   Plan - 01/19/22 1009     Clinical Impression Statement Patient demos no significant improvement in function  since starting therapy. Pain remains elevated and reported function remains the same. Patient MMTs have actually decreased grossly. Patient admits noncompliance with HEP despite extensive education on how these apply and that she consistently shows significant reduction in pain symptoms with lumbar  extension-based stretching when performed in clinic. It is unclear her rationale. At this point, patient has reached max benefit of therapy and should continue with HEP independently. Reviewed HEP once more, and answered all patient questions. Encouraged patient to follow up with PCP and contact therapy services as needed with any further questions or concerns.    Stability/Clinical Decision Making Stable/Uncomplicated    Clinical Decision Making Low    PT Next Visit Plan DC to HEP    Consulted and Agree with Plan of Care Patient             Patient will benefit from skilled therapeutic intervention in order to improve the following deficits and impairments:     Visit Diagnosis: Low back pain, unspecified back pain laterality, unspecified chronicity, unspecified whether sciatica present  Muscle weakness (generalized)  Pain in left hip  Difficulty in walking, not elsewhere classified     Problem List Patient Active Problem List   Diagnosis Date Noted   Spondylolisthesis at L5-S1 level 01/04/2022   Lumbar radiculopathy 01/04/2022   Trochanteric bursitis of left hip 11/10/2021   Muscle cramp, nocturnal 11/10/2021   Port-A-Cath in place 12/29/2020   Genetic testing 10/08/2020   Family history of breast cancer 09/22/2020   Primary malignant neoplasm of upper outer quadrant of female breast, right ([place]) 08/27/2020   10:21 AM, 01/19/22 Josue Hector PT DPT  Physical Therapist with [facility]  [phone] [place], [place], [address] Phone: [phone]   Fax:  [phone]  Name: Monica [name] MRN: [id number] Date of Birth: 1971-06-13

## 2022-01-20 ENCOUNTER — Encounter (HOSPITAL_COMMUNITY): Payer: Medicaid Other | Admitting: Physical Therapy

## 2022-01-25 ENCOUNTER — Encounter (HOSPITAL_COMMUNITY): Payer: Medicaid Other | Admitting: Physical Therapy

## 2022-01-25 NOTE — Progress Notes (Signed)
I, Wendy Poet, LAT, ATC, am serving as scribe for Dr. Lynne Leader.  Monica [name] is a 51 y.o. female who presents to [place] at [facility] today for f/u of L leg pain due to lumbar radiculopathy.  She was last seen by Dr. Georgina Snell on 01/04/22 to review her L-spine MRI results and was advised to con't PT of which she has completed 10 sessions and been d/c.  She had a L L5-S1 ESI on 01/08/22.  Today, pt reports L leg pain is the same. Pt notes PT reported she was unable to meet her goals. Pt c/o pain even w/ standing when trying to cook supper, w/ numbness radiating to the L foot.  She notes weakness to left foot dorsiflexion.  Physical therapy notes this weakness as well and thinks that she is failing typical conservative management trials.  Diagnostic testing: L-spine MRI- 12/29/21; L-spine XR- 12/11/21; L hip XR- 09/22/21  Pertinent review of systems: No fevers or chills.  Positive for muscle cramping.  Relevant historical information: Breast cancer history   Exam:  BP 138/88    Pulse 88    Ht 5\' 3"  (1.6 m)    Wt 194 lb 3.2 oz (88.1 kg)    SpO2 97%    BMI 34.40 kg/m  General: Well Developed, well nourished, and in no acute distress.   MSK: Spine: Decreased range of motion pain with extension. Decreased strength to left foot dorsiflexion 3+/5.    Lab and Radiology Results    EXAM: MRI LUMBAR SPINE WITHOUT AND WITH CONTRAST   TECHNIQUE: Multiplanar and multiecho pulse sequences of the lumbar spine were obtained without and with intravenous contrast.   CONTRAST:  10mL MULTIHANCE GADOBENATE DIMEGLUMINE 529 MG/ML IV SOLN   COMPARISON:  Lumbar spine radiographs 12/11/2021. Abdominopelvic CT 01/27/2017 (report only).   FINDINGS: Segmentation: Conventional anatomy assumed, with the last open disc space designated L5-S1.Concordant with previous imaging.   Alignment: Approximately 5 mm of anterolisthesis at L5-S1 secondary to chronic bilateral L5 pars  defects.   Vertebrae: No worrisome lesion or acute fracture. As above, chronic bilateral L5 pars defects with associated chronic endplate degenerative changes at L5-S1. Sacroiliac degenerative changes are present bilaterally.   Conus medullaris: Extends to the L1-2 level and appears normal.   Paraspinal and other soft tissues: No significant paraspinal findings.   Disc levels:   Mild disc bulging from T10-11 through L1-2 without resulting spinal stenosis or nerve root encroachment.   L2-3: Mild disc bulging with anterior osteophytes and mild facet hypertrophy. No spinal stenosis or nerve root encroachment.   L3-4: Mild loss of disc height with mild disc bulging. No spinal stenosis or nerve root encroachment.   L4-5: Mild loss of disc height with disc bulging and a broad-based central disc protrusion. Mild bilateral facet hypertrophy. There is mild narrowing of the lateral recesses without L5 nerve root encroachment. Both foramina are patent.   L5-S1: Loss of disc height with annular disc bulging and a grade 1 anterolisthesis related to chronic bilateral L5 pars defects. There is resulting moderate to severe foraminal narrowing, left greater than right. Bilateral L5 nerve root encroachment likely. The spinal canal and lateral recesses are patent.   IMPRESSION: 1. 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 There is resulting moderate to severe foraminal narrowing, left greater than right, with probable bilateral L5 nerve root encroachment. 2. Broad-based central disc protrusion at L4-5 contributes to mild lateral recess  narrowing, but no definite nerve root encroachment. 3. No other significant spinal stenosis or nerve root encroachment. Mild disc degeneration at the additional levels as detailed above.     Electronically Signed   By: Richardean Sale M.D.   On: 12/30/2021 16:45  I, Lynne Leader, personally  (independently) visualized and performed the interpretation of the images attached in this note.   CLINICAL DATA:  Lumbosacral spondylosis without myelopathy with radiculopathy. Predominantly left buttock and leg pain with occasional right leg pain. Chronic L5 pars defects with spondylolisthesis and bilateral L5-S1 neuroforaminal stenosis. No prior injections or surgery.   FLUOROSCOPY: Radiation Exposure Index (as provided by the fluoroscopic device): 2.5 mGy Kerma   PROCEDURE: The procedure, risks, benefits, and alternatives were explained to the patient. Questions regarding the procedure were encouraged and answered. The patient understands and consents to the procedure.   LUMBAR EPIDURAL INJECTION:   An interlaminar approach was performed on the left at L5-S1. The overlying skin was cleansed and anesthetized. A 3.5 inch 20 gauge epidural needle was advanced using loss-of-resistance technique.   DIAGNOSTIC EPIDURAL INJECTION:   Injection of Isovue-M 200 shows a good epidural pattern with spread above and below the level of needle placement, primarily on the left. No vascular opacification is seen.   THERAPEUTIC EPIDURAL INJECTION:   80 mg of Depo-Medrol mixed with 3 mL of 1% lidocaine were instilled. The procedure was well-tolerated, and the patient was discharged thirty minutes following the injection in good condition.   COMPLICATIONS: None immediate.   IMPRESSION: Technically successful interlaminar epidural injection on the left at L5-S1.     Electronically Signed   By: Titus Dubin M.D.   On: 01/08/2022 10:06     Assessment and Plan: 51 y.o. female with left lumbar radiculopathy at L5 with weakness to the left foot dorsiflexion.  This is thought to be secondary to her spondylolisthesis.  Unfortunately she is failing conservative management with PT and first epidural steroid injection.  She had an interlaminar injection at L5-S1 on February 3 which did not  help more than 1 day.  At this point given her severe pain and now developing weakness I think best step is probably referral to spine surgery to discuss surgical options.  We will also simultaneously order a second epidural steroid injection and ask radiology to consider different technique perhaps transforaminal if possible.  However I am not very optimistic about conservative management helping much more.  I think surgery probably is in her future.  I spent time talking with Monica [name] and her mom about general surgery principles and techniques and what to expect with the surgery consultation.   PDMP not reviewed this encounter. Orders Placed This Encounter  Procedures   DG INJECT DIAG/THERA/INC NEEDLE/CATH/PLC EPI/LUMB/SAC W/IMG    Level and technique per radiology. Prior ESI did not provide any relief. Try a different technique. Pt now experiencing L sided leg weakness.    Standing Status:   Future    Standing Expiration Date:   01/27/2023    Order Specific Question:   Reason for Exam (SYMPTOM  OR DIAGNOSIS REQUIRED)    Answer:   Low back pain    Order Specific Question:   Preferred Imaging Location?    Answer:   [id number]    Order Specific Question:   Is the patient pregnant?    Answer:   No   Ambulatory referral to Neurosurgery    Referral Priority:   Routine    Referral Type:  Surgical    Referral Reason:   Specialty Services Required    Requested Specialty:   Neurosurgery    Number of Visits Requested:   1   No orders of the defined types were placed in this encounter.    Discussed warning signs or symptoms. Please see discharge instructions. Patient expresses understanding.   The above documentation has been reviewed and is accurate and complete Lynne Leader, M.D. Total encounter time 20 minutes including face-to-face time with the patient and, reviewing past medical record, and charting on the date of service.   Treatment plan and options as well as MRI review.

## 2022-01-26 ENCOUNTER — Encounter (HOSPITAL_COMMUNITY): Payer: Medicaid Other

## 2022-01-27 ENCOUNTER — Ambulatory Visit (INDEPENDENT_AMBULATORY_CARE_PROVIDER_SITE_OTHER): Payer: Medicaid Other | Admitting: Family Medicine

## 2022-01-27 ENCOUNTER — Telehealth: Payer: Self-pay

## 2022-01-27 ENCOUNTER — Other Ambulatory Visit: Payer: Self-pay

## 2022-01-27 VITALS — BP 138/88 | HR 88 | Ht 63.0 in | Wt 194.2 lb

## 2022-01-27 DIAGNOSIS — M5416 Radiculopathy, lumbar region: Secondary | ICD-10-CM

## 2022-01-27 DIAGNOSIS — M4317 Spondylolisthesis, lumbosacral region: Secondary | ICD-10-CM

## 2022-01-27 NOTE — Telephone Encounter (Signed)
Pt called and LVM stating she has questions and is requesting call back. Attempted to call pt; no answer, LVM for return call.

## 2022-01-27 NOTE — Patient Instructions (Addendum)
Thank you for coming in today.   Please call [facility] at [phone] or [phone] to schedule your spine injection.    I've placed the referral for neurosurgery. You should hear about scheduling within 1 week. If you don't please let me know.

## 2022-01-27 NOTE — Telephone Encounter (Signed)
Error

## 2022-01-28 ENCOUNTER — Encounter (HOSPITAL_COMMUNITY): Payer: Medicaid Other

## 2022-01-29 ENCOUNTER — Telehealth: Payer: Self-pay

## 2022-01-29 NOTE — Telephone Encounter (Signed)
Return call to pt, pt wanted to ask if her "c" would come back if she has surgery on her spine.  Pt confirmed that "c" is how she refers to her cancer.  She is seeing a spine specialist to help her with sciatica pain she is experiencing.  I advised pt that having surgery is not an indicator for cancer returning, she takes her anti estrogen as directed, and does not miss visits or scans for surveillance.  I also advised pt to never hesitate to reach out if she has questions or concerns, new symptoms, or needs reassurance.  Pt verbalized understanding of the importance of the above and thanked me for helping her.

## 2022-02-03 ENCOUNTER — Ambulatory Visit (HOSPITAL_COMMUNITY): Payer: Medicaid Other

## 2022-02-04 ENCOUNTER — Telehealth: Payer: Self-pay | Admitting: Family Medicine

## 2022-02-04 ENCOUNTER — Telehealth: Payer: Self-pay

## 2022-02-04 NOTE — Telephone Encounter (Signed)
PA # [id number] ?

## 2022-02-04 NOTE — Telephone Encounter (Signed)
This is denied because Doctors notes does not specify that the last shot had at least 30% less pain afterward. Also notes do not specify that low back pain is not at a level 6 on a 0-10 scale. Note also does not states that patient is doing low back exercises or stretches since the last shot.  ? ?I see an option that allows a by phone appeal. Can call [phone] at providers convenience to explain why this procedure is medically necessary and to inform of the information that was missing above  ?

## 2022-02-04 NOTE — Telephone Encounter (Signed)
Called and spoke with pt, pt questioning why her MRI was cancelled.  I explained to pt that authorization is currently pending review with MD and we will reach out to reschedule as soon as we have approval.  Pt verbalized understanding and thanks ?

## 2022-02-04 NOTE — Telephone Encounter (Signed)
Monica [name] from [place] called. NIA denied epidural but will not advise her why because she is not the ordering physician. ? ?NIA Phone  [phone] ?Tracking [id number] ?

## 2022-02-11 ENCOUNTER — Telehealth: Payer: Self-pay | Admitting: Adult Health

## 2022-02-11 NOTE — Telephone Encounter (Signed)
[phone]  ?Monica [name]  DOB 04-12-1971 ?PA # [id number] ?Member ID [id number] ?MRI Left Hip CPT 73721 ?  ?I called the above number and was unable to connect to the peer-to-peer line.  I was informed that the request was withdrawn for the MRI of the hip, after multiple attempts and time on hold I was connected with a representative.   ? ?Chauncey Reading.  ? ?Butch Penny told me that there were two requests for MRI hip, and one was approved and that request was withdrawn, and the second MRI was denied.   ? ?New tracking #[id number] ? ?Dr. Kizzie Furnish answered the call and informed me that the request was denied because the MRI indicating an indeterminate lesion needing follow-up was missing from the paperwork.  She requested I fax this report to [phone] with the tracking number on it so that it could be approved.   ? ?I have printed the report and will walk it down to my nurse to fax.   ? ?Time Spent: 30 minutes. ? ?Wilber Bihari, NP 02/11/22 4:06 PM ?Medical Oncology and Hematology ?[place] ?[address] ?[place], [place] [address] ?Tel. [phone]    Fax. [phone] ? ? ? ?

## 2022-02-11 NOTE — Progress Notes (Signed)
Faxed additional information requested to assist in approval for pt MRI Hip.  Information faxed to [phone] reference tracking/PA # [id number] ?

## 2022-02-15 ENCOUNTER — Ambulatory Visit (HOSPITAL_COMMUNITY)
Admission: RE | Admit: 2022-02-15 | Discharge: 2022-02-15 | Disposition: A | Discharge status: Home / Self Care | Payer: Medicaid Other | Source: Ambulatory Visit | Attending: Hematology and Oncology | Admitting: Hematology and Oncology

## 2022-02-15 ENCOUNTER — Inpatient Hospital Stay: Payer: Medicaid Other | Attending: Hematology and Oncology

## 2022-02-15 ENCOUNTER — Other Ambulatory Visit: Payer: Self-pay

## 2022-02-15 DIAGNOSIS — C50411 Malignant neoplasm of upper-outer quadrant of right female breast: Secondary | ICD-10-CM | POA: Insufficient documentation

## 2022-02-15 DIAGNOSIS — Z170 Estrogen receptor positive status [ER+]: Secondary | ICD-10-CM | POA: Insufficient documentation

## 2022-02-15 DIAGNOSIS — R911 Solitary pulmonary nodule: Secondary | ICD-10-CM | POA: Insufficient documentation

## 2022-02-15 LAB — CBC WITH DIFFERENTIAL (CANCER CENTER ONLY)
Abs Immature Granulocytes: 0.01 10*3/uL (ref 0.00–0.07)
Basophils Absolute: 0 10*3/uL (ref 0.0–0.1)
Basophils Relative: 0 %
Eosinophils Absolute: 0.1 10*3/uL (ref 0.0–0.5)
Eosinophils Relative: 1 %
HCT: 37.6 % (ref 36.0–46.0)
Hemoglobin: 13.1 g/dL (ref 12.0–15.0)
Immature Granulocytes: 0 %
Lymphocytes Relative: 21 %
Lymphs Abs: 1.4 10*3/uL (ref 0.7–4.0)
MCH: 27.6 pg (ref 26.0–34.0)
MCHC: 34.8 g/dL (ref 30.0–36.0)
MCV: 79.2 fL — ABNORMAL LOW (ref 80.0–100.0)
Monocytes Absolute: 0.6 10*3/uL (ref 0.1–1.0)
Monocytes Relative: 9 %
Neutro Abs: 4.8 10*3/uL (ref 1.7–7.7)
Neutrophils Relative %: 69 %
Platelet Count: 250 10*3/uL (ref 150–400)
RBC: 4.75 MIL/uL (ref 3.87–5.11)
RDW: 13.6 % (ref 11.5–15.5)
WBC Count: 6.9 10*3/uL (ref 4.0–10.5)
nRBC: 0 % (ref 0.0–0.2)

## 2022-02-15 LAB — CMP (CANCER CENTER ONLY)
ALT: 23 U/L (ref 0–44)
AST: 27 U/L (ref 15–41)
Albumin: 4 g/dL (ref 3.5–5.0)
Alkaline Phosphatase: 104 U/L (ref 38–126)
Anion gap: 4 — ABNORMAL LOW (ref 5–15)
BUN: 9 mg/dL (ref 6–20)
CO2: 27 mmol/L (ref 22–32)
Calcium: 9.8 mg/dL (ref 8.9–10.3)
Chloride: 109 mmol/L (ref 98–111)
Creatinine: 0.81 mg/dL (ref 0.44–1.00)
GFR, Estimated: >60 mL/min (ref >60)
Glucose, Bld: 96 mg/dL (ref 70–99)
Potassium: 3.5 mmol/L (ref 3.5–5.1)
Sodium: 140 mmol/L (ref 135–145)
Total Bilirubin: 0.6 mg/dL (ref 0.3–1.2)
Total Protein: 7.1 g/dL (ref 6.5–8.1)

## 2022-02-15 IMAGING — CT CT CHEST-ABD-PELV W/ CM
2 of 5 series · 13 of 36 positions shown, 15 images · IV contrast (agent unspecified)
Comparison: MRI lumbar spine [DATE].

CLINICAL DATA: History of breast cancer status post lumpectomy,
chemo and radiation. Abdominal pain and bone pain.

* onc *
EXAM:
CT CHEST, ABDOMEN, AND PELVIS WITH CONTRAST
TECHNIQUE: Multidetector CT imaging of the chest, abdomen and pelvis was
performed following the standard protocol during bolus
administration of intravenous contrast.

[Series 2: cap with · axial · 0.89mm/px · z∈[+1042,+1547]mm · 10 of 125 slices shown, 12 images]
[im 12/125  mediastinal]
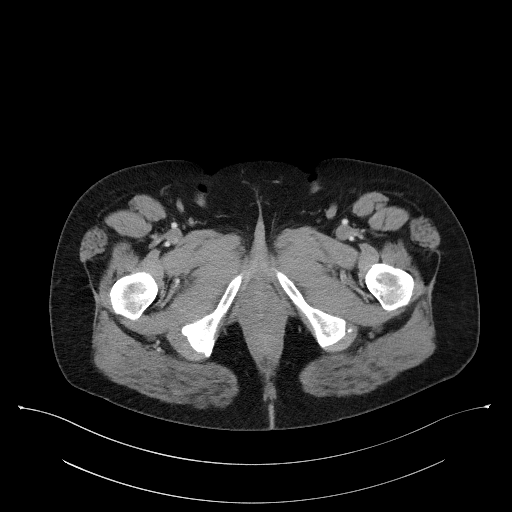
[im 12/125  bone]
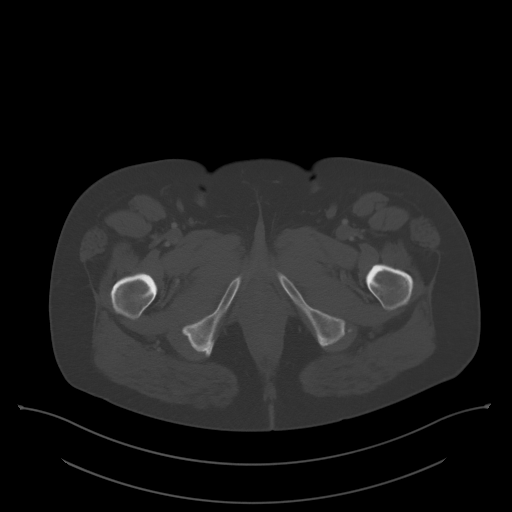
[im 23/125  mediastinal]
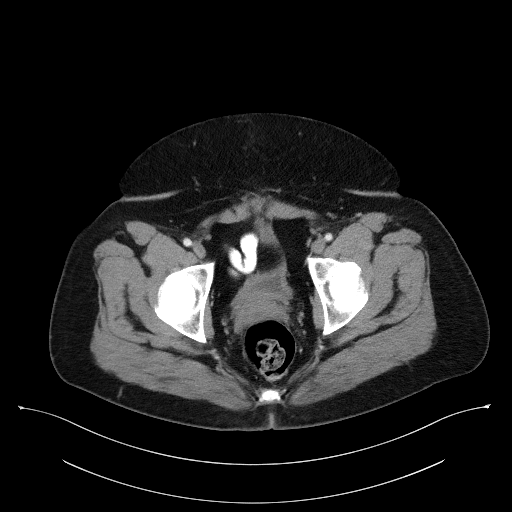
[im 34/125  mediastinal]
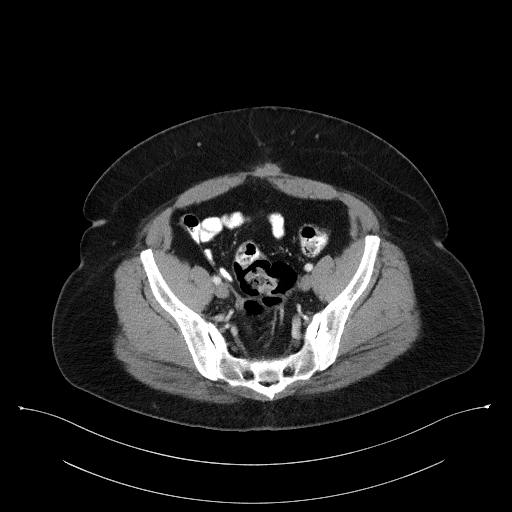
[im 46/125  mediastinal]
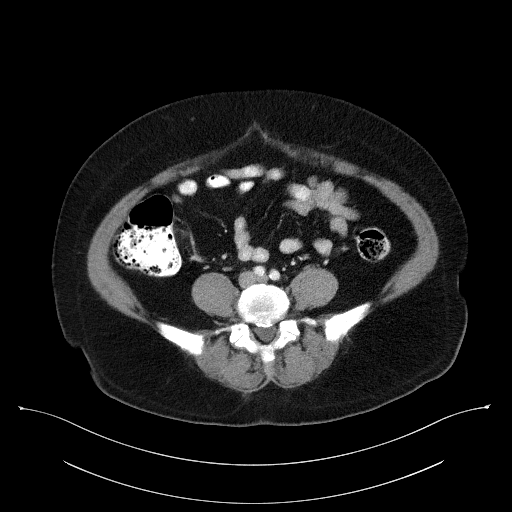
[im 57/125  mediastinal]
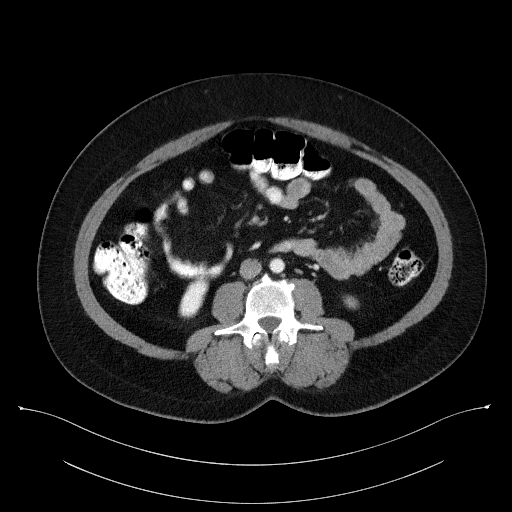
[im 68/125  mediastinal]
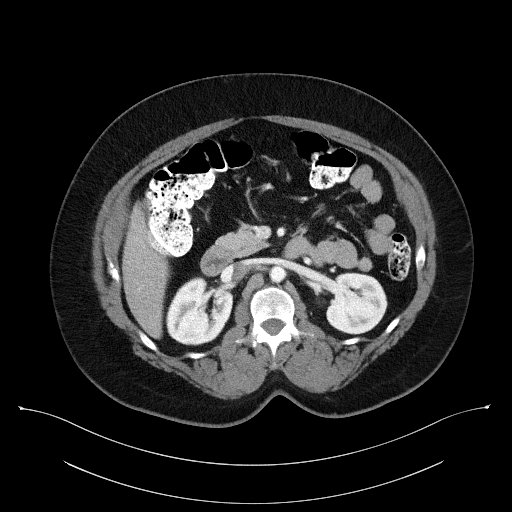
[im 79/125  mediastinal]
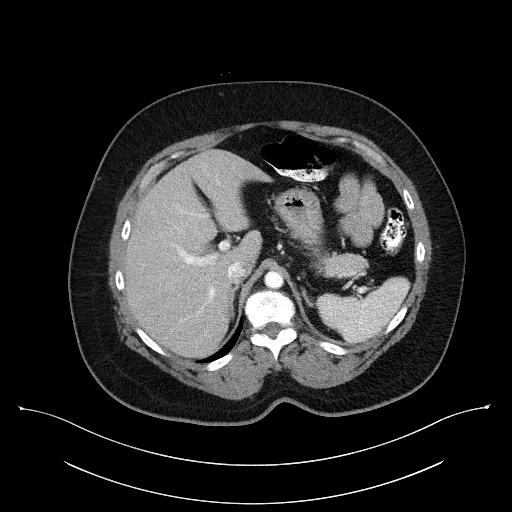
[im 91/125  mediastinal]
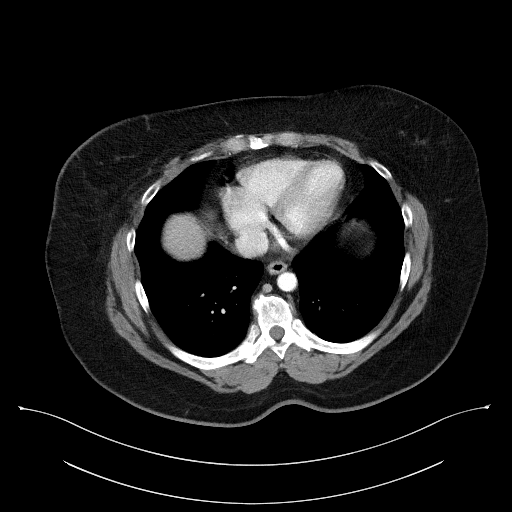
[im 102/125  mediastinal]
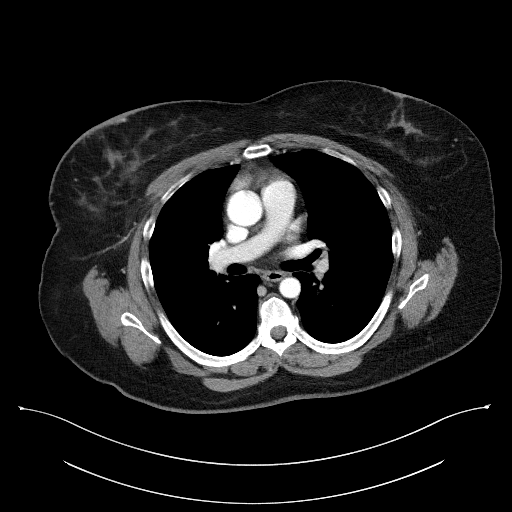
[im 102/125  bone]
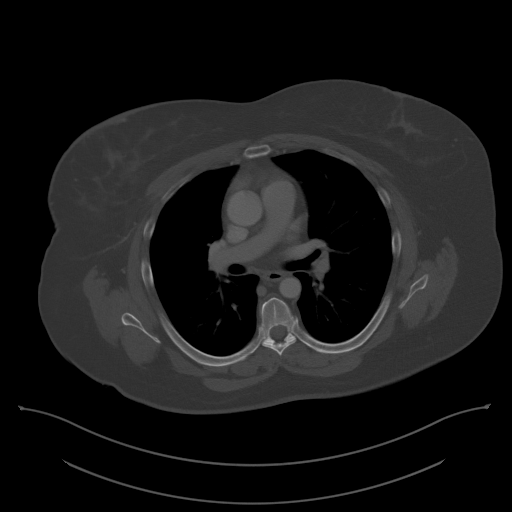
[im 113/125  mediastinal]
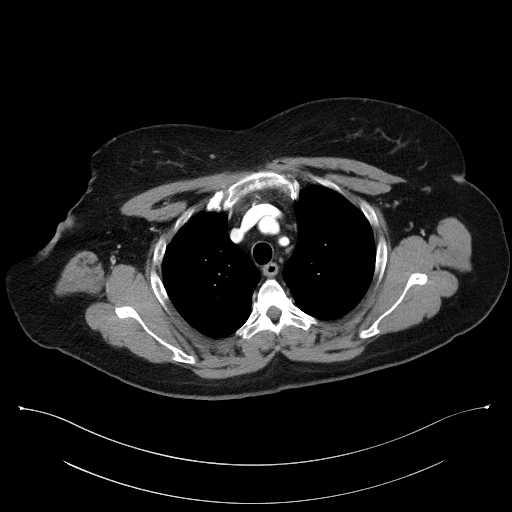

[Series 5: coronals · coronal · 0.75mm/px · 3 of 159 slices shown]
[im 32/159  mediastinal]
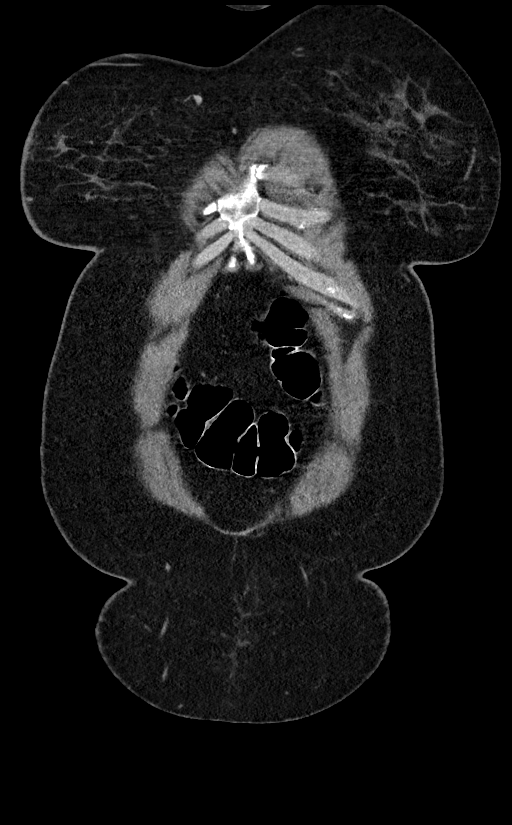
[im 64/159  mediastinal]
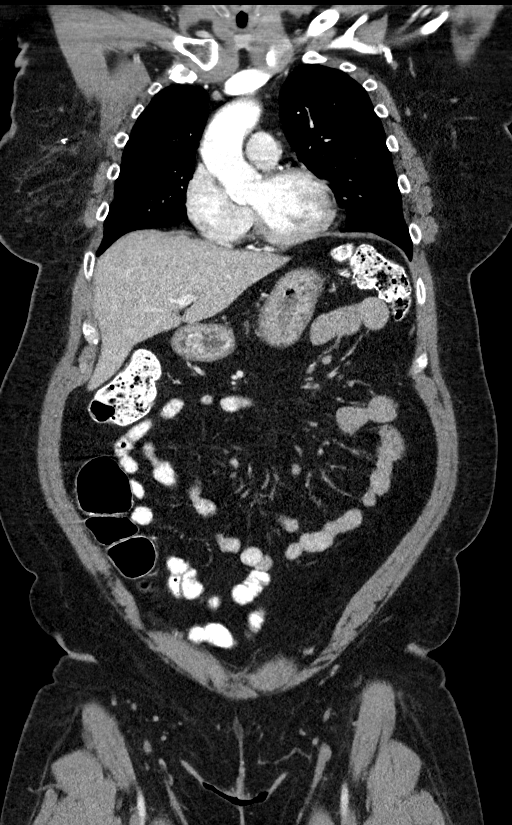
[im 95/159  mediastinal]
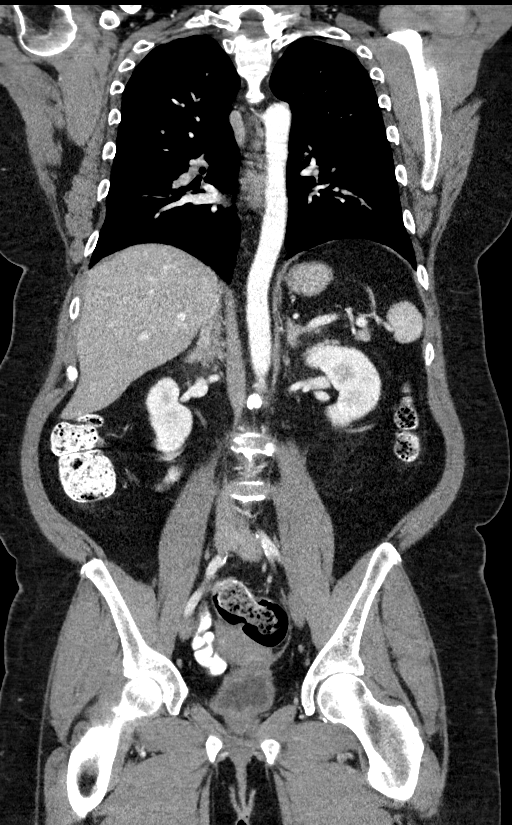

[13 of 36 positions shown; findings below may reference images not displayed]

RADIATION DOSE REDUCTION: This exam was performed according to the
departmental dose-optimization program which includes automated
exposure control, adjustment of the mA and/or kV according to
patient size and/or use of iterative reconstruction technique.

CONTRAST:  100mL OMNIPAQUE IOHEXOL 300 MG/ML  SOLN
Report from CT
abdomen pelvis [DATE] however no comparison imaging
available at time dictation.
FINDINGS: CT CHEST FINDINGS

Cardiovascular: Normal caliber thoracic aorta. No central pulmonary
embolus on this nondedicated study. Normal size heart. No
significant pericardial effusion/thickening.

Mediastinum/Nodes: No discrete thyroid nodule. No pathologically
enlarged mediastinal, hilar or axillary lymph nodes. Esophagus is
grossly unremarkable.

Lungs/Pleura: Mild diffuse bronchial wall thickening with mosaic
attenuation of the lungs. There are few scattered tiny right-sided
pulmonary nodules for instance a 3 mm right lower lobe pulmonary
nodule on image 73/4, a 2 mm right upper lobe pulmonary nodule on
image 38/4 and a 2 mm right upper lobe pulmonary nodule image 59/4.
No pleural effusion. No pneumothorax.

Musculoskeletal: Surgical changes of right lumpectomy. Multilevel
degenerative changes spine. Bilateral glenohumeral degenerative
change. No aggressive lytic or blastic lesion of bone.

CT ABDOMEN PELVIS FINDINGS

Hepatobiliary: Hepatic steatosis. No suspicious hepatic lesion.
Gallbladder is unremarkable.

Pancreas: No pancreatic ductal dilation or evidence of acute
inflammation.

Spleen: No splenomegaly or focal splenic lesion.

Adrenals/Urinary Tract: Bilateral adrenal glands appear normal. No
hydronephrosis. Kidneys demonstrate symmetric enhancement and
excretion of contrast material. Mild symmetric wall thickening of an
incompletely distended urinary bladder.

Stomach/Bowel: Radiopaque enteric contrast material traverses the
descending colon. Stomach is unremarkable for degree of distension.
No pathologic dilation of small or large bowel. The terminal ileum
appears normal. Moderate volume of formed stool throughout the colon
suggestive of constipation. No evidence of acute bowel inflammation.

Vascular/Lymphatic: Normal caliber abdominal aorta. No
pathologically enlarged abdominal or pelvic lymph nodes.

Reproductive: Uterus and bilateral adnexa are unremarkable.

Other: No significant abdominopelvic free fluid.

Musculoskeletal: Tiny sclerotic lesion in the inter trochanteric
left femur measures 6 mm on image 97/5, unchanged since MRI hip
[DATE] and likely reflecting a small enchondroma. No
aggressive lytic or blastic lesions of bone.
IMPRESSION: 1. Scattered tiny right-sided pulmonary nodules measuring up to 3
mm, nonspecific and possibly infectious or inflammatory. However,
metastatic disease while favored less likely is not entirely
excluded. Recommend attention on close interval follow-up dedicated
chest CT.
2. Surgical changes of right lumpectomy, without evidence of local
recurrence.
3. Tiny sclerotic lesion in the inter trochanteric left femur
measures 6 mm, unchanged since MRI hip [DATE] and likely
reflecting a small enchondroma. Continued attention on follow-up
imaging suggested.
4. Mild diffuse bronchial wall thickening with mosaic attenuation of
the lungs, which can be seen with small airways disease.
5. Mild symmetric wall thickening of an incompletely distended
urinary bladder. Correlate with urinalysis to exclude cystitis.
6. Moderate volume of formed stool throughout the colon suggestive
of constipation.
7. Hepatic steatosis.

## 2022-02-15 MED ORDER — IOHEXOL 300 MG/ML  SOLN
100.0000 mL | Freq: Once | INTRAMUSCULAR | Status: AC | PRN
  Administered 2022-02-15: 100 mL via INTRAVENOUS

## 2022-02-15 NOTE — Progress Notes (Incomplete)
° °Patient Care Team: °Muse, Rochelle D., PA-C as PCP - General °Gudena, Vinay, MD as Consulting Physician (Hematology and Oncology) °Squire, Sarah, MD as Attending Physician (Radiation Oncology) °Cornett, Thomas, MD as Consulting Physician (General Surgery) ° °DIAGNOSIS: No diagnosis found. ° °SUMMARY OF ONCOLOGIC HISTORY: °Oncology History  °Primary malignant neoplasm of upper outer quadrant of female breast, right (HCC)  °08/12/2020 Initial Diagnosis  ° Screening mammogram on 07/07/20 showed an asymmetry. Diagnostic mammogram and US on 07/29/20 showed a 0.8cm mass at the 10 o'clock position and no right axillary adenopathy. Biopsy on 08/12/20 showed invasive and in situ mammary carcinoma, grade 2, HER-2 equivocal by IHC, positive by FISH, ER+ 80%, PR+ 60%, Ki67 15%. 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  Variants of uncertain significance detected in ATM (c.131A>G (p.Asp44Gly) and c.4658A>C (p.Glu1553Ala)) and WRN (c.2825+6G>T (intronic)).  The report date is October 02, 2020.  ° °The Multi-Cancer Panel offered by [facility] includes sequencing and/or deletion duplication testing of the following 85 genes: AIP, ALK, APC, ATM, AXIN2,BAP1,  BARD1, BLM, BMPR1A, BRCA1, BRCA2, BRIP1, CASR, CDC73, CDH1, CDK4, CDKN1B, CDKN1C, CDKN2A (p14ARF), CDKN2A (p16INK4a), CEBPA, CHEK2, CTNNA1, DICER1,  DIS3L2, EGFR (c.2369C>T, p.Thr790Met variant only), EPCAM (Deletion/duplication testing only), FH, FLCN, GATA2, GPC3, GREM1 (Promoter region deletion/duplication testing only), HOXB13 (c.251G>A, p.Gly84Glu), HRAS, KIT, MAX, MEN1, MET, MITF (c.952G>A, p.Glu318Lys variant only), MLH1, MSH2, MSH3, MSH6, MUTYH, NBN, NF1, NF2, NTHL1, PALB2, PDGFRA, PHOX2B, PMS2, POLD1, POLE, POT1, PRKAR1A, PTCH1, PTEN, RAD50, RAD51C, RAD51D, RB1, RECQL4, RET, RNF43, RUNX1, SDHAF2, SDHA (sequence changes only), SDHB, SDHC, SDHD, SMAD4, SMARCA4, SMARCB1, SMARCE1, STK11, SUFU, TERC, TERT, TMEM127, TP53, TSC1, TSC2, VHL, WRN and WT1.  ° °Amended report: The variant of uncertain significance (VUS) in WRN at  c.2825+6G>T (Intronic) has been reclassified to likely benign.  The change in variant classification was made as a result of re-review of evidence in light of new variant interpretation guidelines and/or new information. The amended report date is December 18, 2020.  °  °02/10/2021 - 03/24/2021 Radiation Therapy  ° Site Technique Total Dose (Gy) Dose per Fx (Gy) Completed Fx Beam Energies  °Breast, Right: Breast_Rt 3D 50/50 2 25/25 10X  °Breast, Right: Breast_Rt_PAB_SCV 3D 50/50 2 25/25 6X, 15X  °Breast, Right: Breast_Rt_Bst 3D 10/10 2 5/5 6X, 10X  ° °  °04/13/2021 -  Anti-estrogen oral therapy  ° Initially anastrozole switched to letrozole for fatigue issues °  ° ° °CHIEF COMPLIANT: Follow-up after recent MRI ° °INTERVAL HISTORY: 50 y.o. with above-mentioned history of right breast cancer underwent a right lumpectomy followed by re-excision, adjuvant chemotherapy, and radiation, completed Herceptin maintenance therapy.  She had intractable pain in the left hip and had a hip MRI.  She has seen orthopedics as well.  There is no evidence of metastatic disease and therefore the recommended watchful monitoring.  She has been set up with physical therapy. She presents to the clinic today   today for follow-up. She states   ALLERGIES:  has No Known  Allergies.  MEDICATIONS:  Current Outpatient Medications  Medication Sig Dispense Refill   albuterol (VENTOLIN HFA) 108 (90 Base) MCG/ACT inhaler Inhale 2 puffs into the lungs every 4 (four) hours as needed for wheezing or shortness of breath. 8 g 3   baclofen (LIORESAL) 10 MG tablet Take 1 tablet (10 mg total) by mouth at bedtime as needed for muscle spasms. 90 each 1   Cholecalciferol (VITAMIN D3) 125 MCG (5000 UT) TABS Take 1 tablet by mouth daily.     clonazePAM (KLONOPIN) 0.5 MG tablet Take 0.5 mg by mouth daily as needed.     escitalopram (LEXAPRO) 10 MG tablet Take 10 mg by mouth daily.     gabapentin (NEURONTIN) 300 MG capsule Take 1 capsule (300 mg total) by mouth 3 (three) times daily. 90 capsule 2   HYDROcodone-acetaminophen (NORCO/VICODIN) 5-325 MG tablet Take 1 tablet by mouth every 6 (six) hours as needed. 15 tablet 0   letrozole (FEMARA) 2.5 MG tablet Take 2.5 mg by mouth daily.     lisinopril (ZESTRIL) 10 MG tablet Take 1 tablet (10 mg total) by mouth daily.     prazosin (MINIPRESS) 1 MG capsule Take 1 mg by mouth at bedtime.     No current facility-administered medications for this visit.    PHYSICAL EXAMINATION: ECOG PERFORMANCE STATUS: {CHL ONC ECOG PS:[id number]}  There were no vitals filed for this visit. There were no vitals filed for this visit.  BREAST:*** No palpable masses or nodules in either right or left breasts. No palpable axillary supraclavicular or infraclavicular adenopathy no breast tenderness or nipple discharge. (exam performed in the presence of a chaperone)  LABORATORY DATA:  I have reviewed the data as listed CMP Latest Ref Rng & Units 02/15/2022 11/17/2021 09/28/2021  Glucose 70 - 99 mg/dL 96 87 94  BUN 6 - 20 mg/dL _0 Creatinine 0.44 - 1.00 mg/dL 0.81 0.94 0.67  Sodium 135 - 145 mmol/L 140 141 137  Potassium 3.5 - 5.1 mmol/L 3.5 3.6 3.8  Chloride 98 - 111 mmol/L 109 107 107  CO2 22 - 32 mmol/L _1 Calcium 8.9 - 10.3 mg/dL 9.8 9.2  8.8(L)  Total Protein 6.5 - 8.1 g/dL 7.1 7.1 7.1  Total Bilirubin 0.3 - 1.2 mg/dL 0.6 0.5 0.4  Alkaline Phos 38 - 126 U/L 104 110 102  AST 15 - 41 U/L 27 29 34  ALT 0 - 44 U/L _2 Lab Results  Component Value Date   WBC 6.9 02/15/2022   HGB 13.1 02/15/2022   HCT 37.6 02/15/2022   MCV 79.2 (L) 02/15/2022   PLT 250 02/15/2022   NEUTROABS 4.8 02/15/2022    ASSESSMENT & PLAN:  No problem-specific Assessment & Plan notes found for this encounter.    No orders of the defined types were placed in this encounter.  The patient has a good understanding of the overall plan. she agrees with it. she will call with any problems that may develop before the next visit here. Total time spent: 30 mins including face to face time and time spent for planning, charting and co-ordination of care   Suzzette Righter, [place] 02/15/22    I, Gardiner Coins, am acting as a scribe for Dr. Lindi Adie

## 2022-02-16 ENCOUNTER — Encounter: Payer: Self-pay | Admitting: Hematology and Oncology

## 2022-02-17 ENCOUNTER — Inpatient Hospital Stay (HOSPITAL_BASED_OUTPATIENT_CLINIC_OR_DEPARTMENT_OTHER): Payer: Medicaid Other | Admitting: Hematology and Oncology

## 2022-02-17 ENCOUNTER — Other Ambulatory Visit: Payer: Self-pay

## 2022-02-17 VITALS — BP 117/66 | HR 72 | Temp 97.9°F | Resp 17 | Ht 63.0 in | Wt 193.9 lb

## 2022-02-17 DIAGNOSIS — Z170 Estrogen receptor positive status [ER+]: Secondary | ICD-10-CM | POA: Diagnosis not present

## 2022-02-17 DIAGNOSIS — C50411 Malignant neoplasm of upper-outer quadrant of right female breast: Secondary | ICD-10-CM | POA: Diagnosis not present

## 2022-02-17 DIAGNOSIS — R911 Solitary pulmonary nodule: Secondary | ICD-10-CM | POA: Diagnosis not present

## 2022-02-17 NOTE — Assessment & Plan Note (Addendum)
09/10/2020:Right lumpectomy ([facility]): invasive lobular carcinoma, 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??ER 80%, PR 60%, Ki-67 15%, HER-2 equivocal by IHC positive for fish ?10/11/20: Re-excision: Neg ?? ?Recommendation: ?1.?Adjuvant chemotherapy with Taxol Herceptin followed by Herceptin maintenance for 1 year ?2. Adjuvant radiation therapy?02/10/21-?03/24/21 ?3. Adjuvant antiestrogen therapy?with anastrozole (patient is postmenopausal)?started 04/13/2021 ?and anti-HER-2 therapy with neratinib ?-------------------------------------------------------------------------------------------------------------------------------- ?Current treatment:?Herceptin maintenance q 3 weeks?with anastrozole, switched to letrozole 07/06/2021?Herceptin maintenance completed November 2022 ?Echocardiogram on October 06, 2021 shows an EF of 60 to 65% ?? ?Letrozole?Toxicities:????We are holding the letrozole right now due to the left hip pain.  She will resume letrozole today. ?? ?Left lateral leg numbness/pain:?09/23/2021: Hip x-ray: Negative, treated with Medrol dose pack?without any relief.????She was seen by Dr. Eli Hose received a cortisone injection on October 09, 2021. ?MRI left hip 11/09/2021: No acute osseous abnormality.  Solitary nonspecific 6 mm lesion intertrochanteric left femur (likely benign) tendinosis with partial-thickness interstitial tearing  bilateral hamstring tendon origins right greater than left ? ?02/15/22: Scattered tiny right-sided pulmonary nodules measuring up to 3 mm, nonspecific and possibly infectious or inflammatory, Tiny sclerotic lesion in the inter trochanteric left femur measures 6 mm, unchanged since MRI hip November 07, 2021 and likely reflecting a small enchondroma ? ?Discussed the pros and cons of using neratinib ?She is going to have spine surgery coming up.  Therefore we will hold off on neratinib at this time. ? ?Plan: 36-monthCT chest for the  lung nodule evaluation and telephone visit day after to discuss results. ?

## 2022-02-23 NOTE — Telephone Encounter (Signed)
PA will not be approved because patient has admitted that the first epidural did not help at all. In the appeal it would have to prove that patient was at least having about 30% less pain after the first one.  ? ? ?

## 2022-02-24 ENCOUNTER — Other Ambulatory Visit: Payer: Self-pay

## 2022-02-24 ENCOUNTER — Ambulatory Visit (HOSPITAL_COMMUNITY)
Admission: RE | Admit: 2022-02-24 | Discharge: 2022-02-24 | Disposition: A | Discharge status: Home / Self Care | Payer: Medicaid Other | Source: Ambulatory Visit | Attending: Adult Health | Admitting: Adult Health

## 2022-02-24 DIAGNOSIS — C50411 Malignant neoplasm of upper-outer quadrant of right female breast: Secondary | ICD-10-CM | POA: Diagnosis present

## 2022-02-24 IMAGING — MR MR HIP*L* WO/W CM
8 series · 40 of 40 positions shown · IV contrast (gadavist)
Comparison: [DATE]

CLINICAL DATA: Follow-up left hip lesion.

EXAM:
MRI OF THE LEFT HIP WITHOUT AND WITH CONTRAST
TECHNIQUE: Multiplanar, multisequence MR imaging was performed both before and
after administration of intravenous contrast.
CONTRAST:  8mL GADAVIST GADOBUTROL 1 MMOL/ML IV SOLN

[Series 8: T1 · coronal · left · 3.0mm · 0.78mm/px · 4 of 35 slices shown]
[im 1/35]
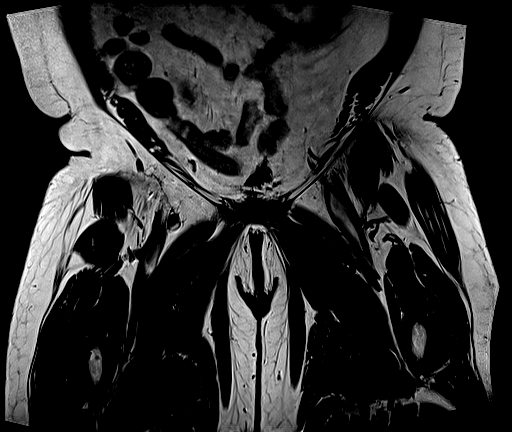
[im 12/35]
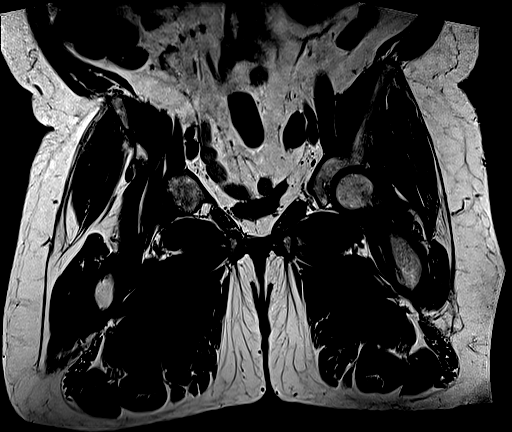
[im 23/35]
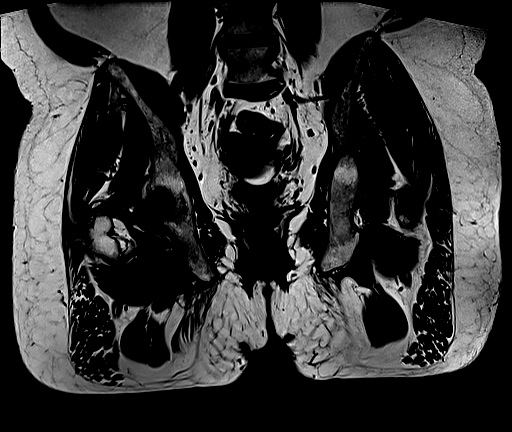
[im 35/35]
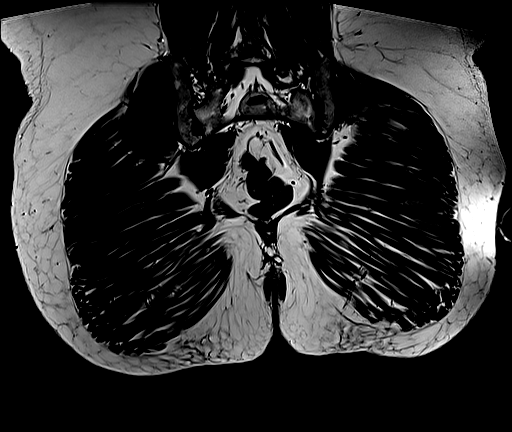

[Series 9: T2 fat-sat · coronal · left · 3.0mm · 1.25mm/px · 5 of 35 slices shown (1 of 2)]
[im 1/35]
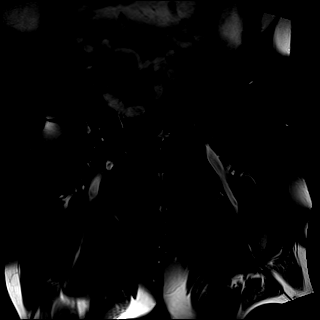
[im 9/35]
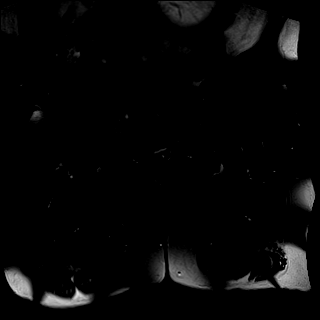
[im 18/35]
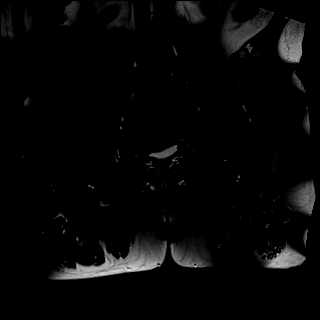
[im 26/35]
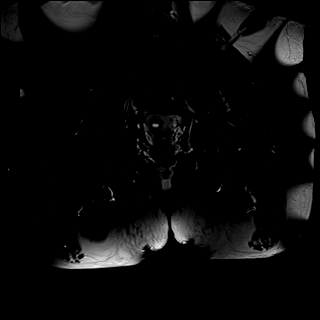
[im 35/35]
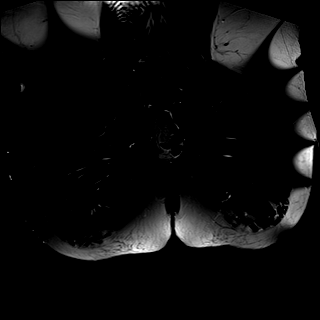

[Series 10: T2 fat-sat · axial · left · 4.0mm · 1.12mm/px · z∈[-117,+161]mm · 8 of 57 slices shown (2 of 2)]
[im 1/57]
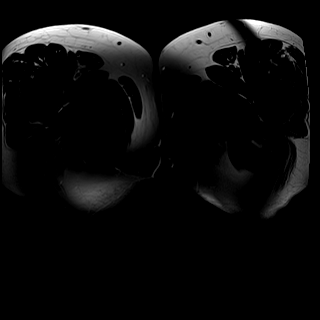
[im 9/57]
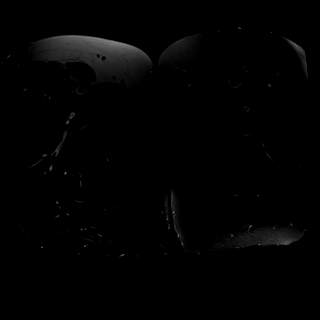
[im 17/57]
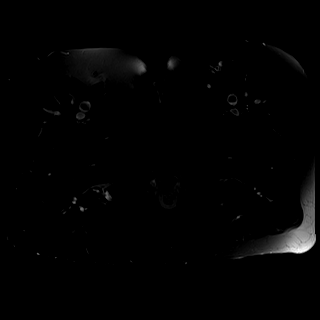
[im 25/57]
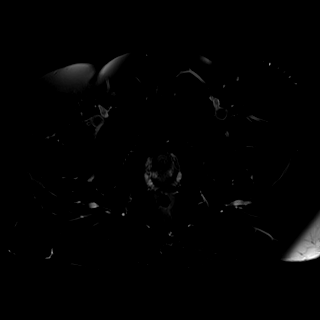
[im 33/57]
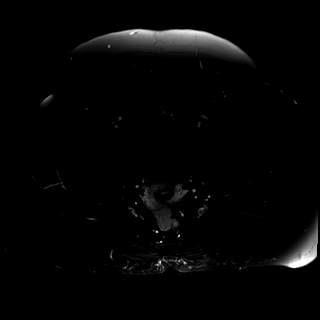
[im 41/57]
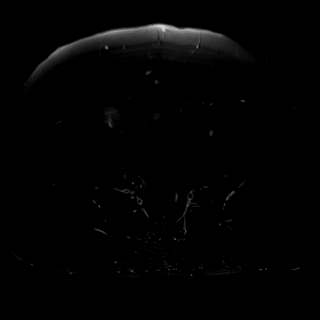
[im 49/57]
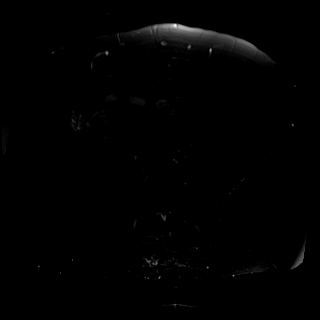
[im 57/57]
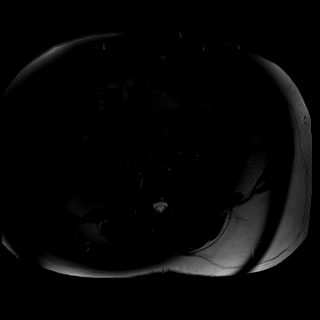

[Series 11: PD fat-sat · coronal · left · 4.0mm · 0.56mm/px · 3 of 20 slices shown (1 of 2)]
[im 1/20]
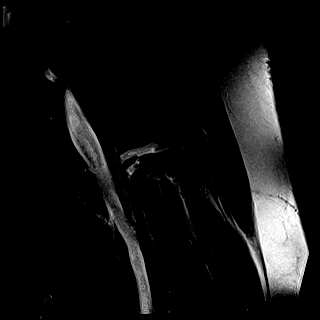
[im 10/20]
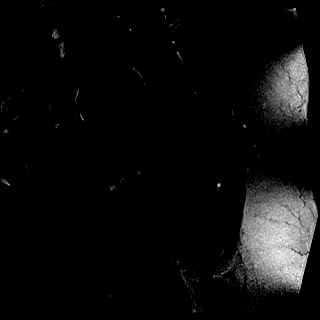
[im 20/20]
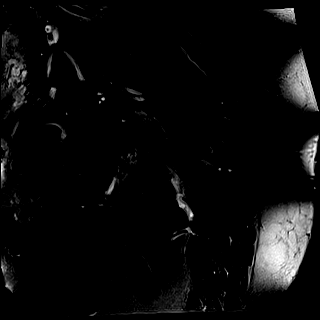

[Series 12: PD fat-sat · sagittal · left · 4.0mm · 0.56mm/px · 5 of 34 slices shown (2 of 2)]
[im 1/34]
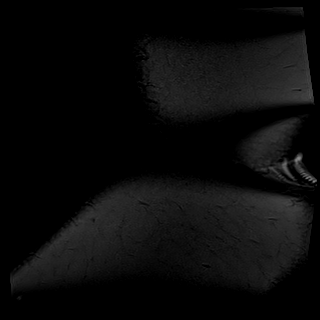
[im 9/34]
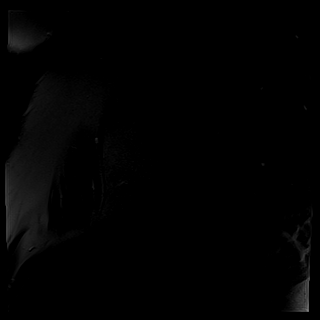
[im 17/34]
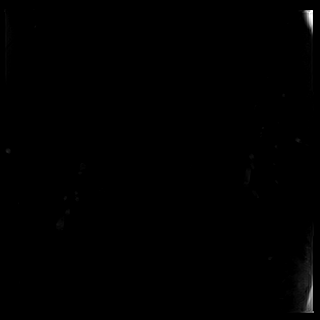
[im 25/34]
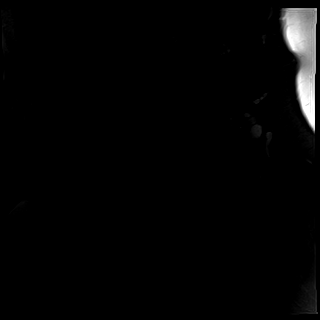
[im 34/34]
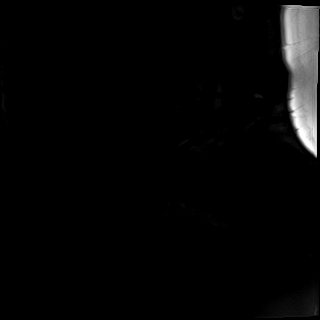

[Series 13: T1 fat-sat · axial · non-contrast · left · 4.0mm · 0.86mm/px · z∈[-99,+114]mm · 6 of 44 slices shown (1 of 2)]
[im 1/44]
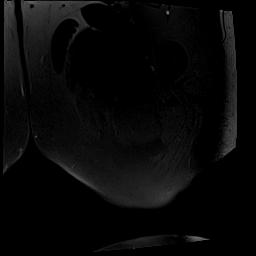
[im 9/44]
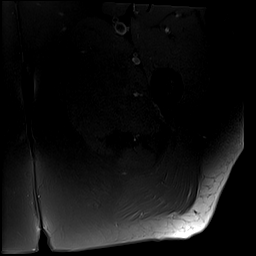
[im 18/44]
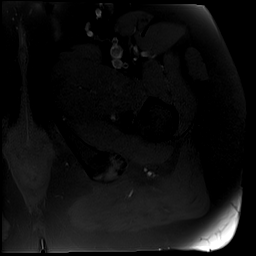
[im 26/44]
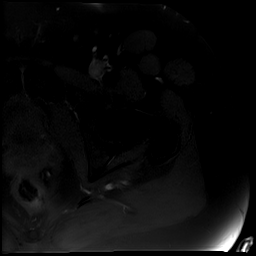
[im 35/44]
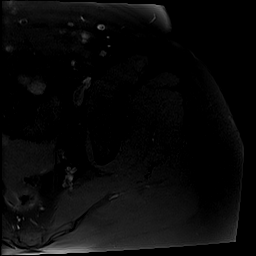
[im 44/44]
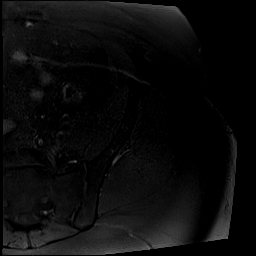

[Series 14: T1 fat-sat post-contrast · axial · left · 4.0mm · 0.86mm/px · z∈[-99,+114]mm · 6 of 44 slices shown]
[im 1/44]
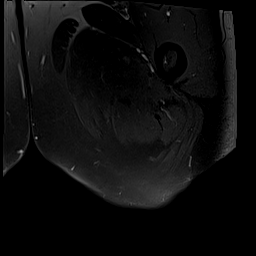
[im 9/44]
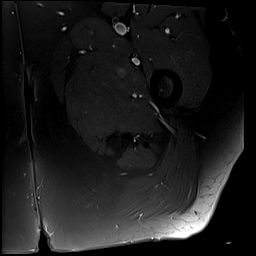
[im 18/44]
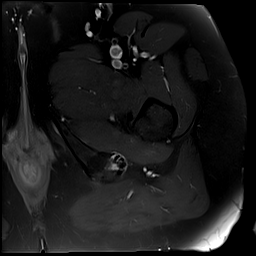
[im 26/44]
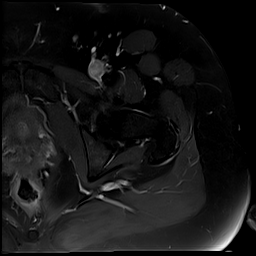
[im 35/44]
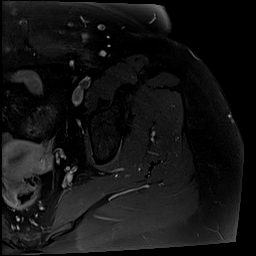
[im 44/44]
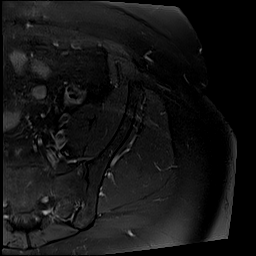

[Series 15: T1 fat-sat · coronal · left · 4.0mm · 0.35mm/px · 3 of 20 slices shown (2 of 2)]
[im 1/20]
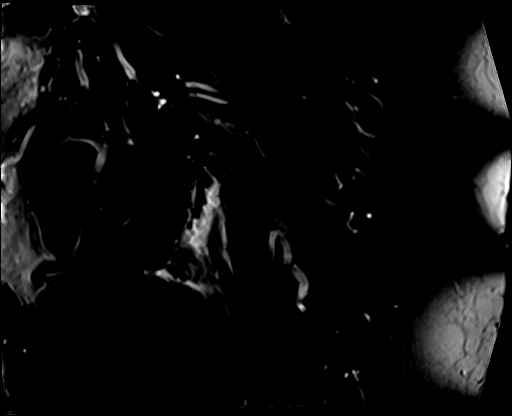
[im 10/20]
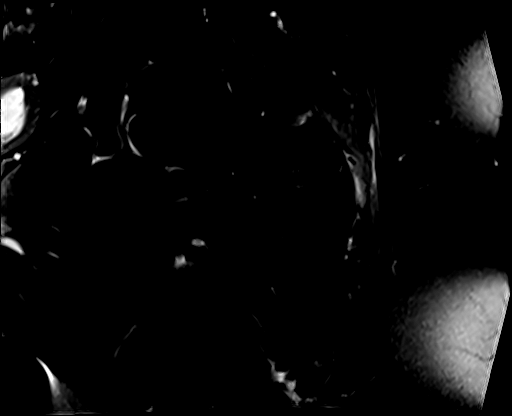
[im 20/20]
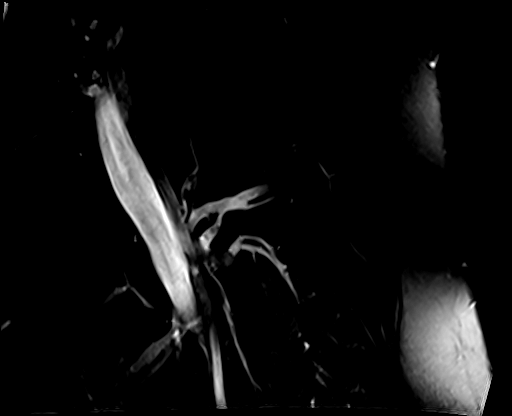

[40 of 40 positions shown; findings below may reference images not displayed]

FINDINGS: Bones:

No hip fracture, dislocation or avascular necrosis.

No periosteal reaction or bone destruction. No aggressive osseous
lesion. Well-defined stable 6 mm T1 hypointense and T2 hyperintense
bone lesion in the intertrochanteric region of the left proximal
femur without surrounding marrow edema, bone destruction, or soft
tissue mass most consistent with a benign chondroid lesion such as
an enchondroma.

Moderate osteoarthritis of the sacroiliac joints bilaterally. No SI
joint widening or erosive changes.

Degenerative disease with severe disc height loss at L5-S1.

Articular cartilage and labrum

Articular cartilage: Partial-thickness cartilage loss of the femoral
head and acetabulum bilaterally.

Labrum:  Degeneration of the left superior labrum.

Joint or bursal effusion

Joint effusion:  No hip joint effusion.  No SI joint effusion.

Bursae:  No bursa formation.

Muscles and tendons

Flexors: Normal.

Extensors: Normal.

Abductors: Normal.

Adductors: Normal.

Gluteals: Mild tendinosis of the gluteus medius tendon insertions
bilaterally.

Hamstrings: Moderate tendinosis of the right hamstring origin with a
large partial-thickness tear. Mild tendinosis of the left hamstring
origin with a small partial-thickness tear.

Other findings

No pelvic free fluid. No fluid collection or hematoma. No inguinal
lymphadenopathy. No inguinal hernia.
IMPRESSION: 1. Stable nonaggressive 6 mm bone lesion in the intertrochanteric
region of the left proximal femur most consistent with a benign
chondroid lesion such as an enchondroma.
2. Mild osteoarthritis of bilateral hips.
3. Moderate tendinosis of the right hamstring origin with a large
partial-thickness tear.
4. Mild tendinosis of the left hamstring origin with a small
partial-thickness tear.
5. Mild tendinosis of the gluteus medius tendon insertions
bilaterally.

## 2022-02-24 MED ORDER — GADOBUTROL 1 MMOL/ML IV SOLN
8.0000 mL | Freq: Once | INTRAVENOUS | Status: AC | PRN
  Administered 2022-02-24: 8 mL via INTRAVENOUS

## 2022-03-01 ENCOUNTER — Telehealth: Payer: Self-pay

## 2022-03-01 NOTE — Telephone Encounter (Signed)
Pt called to obtain an understanding of MRI results. Explained results to pt and advised calcium/vit D supplement, weight bearing and non weight bearing exercise such as swimming, Pt verbalized thanks and understanding.  ?

## 2022-03-01 NOTE — Progress Notes (Signed)
MRI of the left hip shows a stable nonaggressive lesion in the hip bone that looks like a benign cartilage lesion such as an enchondroma.  This does not look to be cancer related. ? ?You have medium right hamstring tendinitis and mild left hamstring tendinitis and mild tendinitis of the gluteus medius tendons on the side of the hips on both sides.

## 2022-05-06 NOTE — Progress Notes (Incomplete)
HEMATOLOGY-ONCOLOGY TELEPHONE VISIT PROGRESS NOTE  I connected with _0 @ on 05/06/22 at  9:15 AM EDT by telephone and verified that I am speaking with the correct person using two identifiers.  I discussed the limitations, risks, security and privacy concerns of performing an evaluation and management service by telephone and the availability of in person appointments.  I also discussed with the patient that there may be a patient responsible charge related to this service. The patient expressed understanding and agreed to proceed.   History of Present Illness: Monica [name] is a 51 y.o. with above-mentioned history of right breast cancer. She presents to the clinic today for via telephone follow-up   Oncology History  Primary malignant neoplasm of upper outer quadrant of female breast, right ([place])  08/12/2020 Initial Diagnosis   Screening mammogram on 07/07/20 showed an asymmetry. Diagnostic mammogram and [place] on 07/29/20 showed a 0.8cm mass at the 10 o'clock position and no right axillary adenopathy. Biopsy on 08/12/20 showed invasive and in situ mammary carcinoma, grade 2, HER-2 equivocal by IHC, positive by FISH, ER+ 80%, PR+ 60%, Ki67 15%.   08/26/2020 Cancer Staging   Staging form: Breast, AJCC 8th Edition - Clinical stage from 08/26/2020: Stage IA (cT1b, cN0, cM0, G2, ER+, PR+, HER2+) - Signed by Eppie Gibson, MD on 08/27/2020    09/10/2020 Surgery   Right lumpectomy ([facility]): invasive lobular carcinoma, 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  Variants of uncertain significance detected in ATM (c.131A>G (p.Asp44Gly) and c.4658A>C  (p.Glu1553Ala)) and WRN (c.2825+6G>T (intronic)).  The report date is October 02, 2020.   The Multi-Cancer Panel offered by [facility] includes sequencing and/or deletion duplication testing of the following 85 genes: AIP, ALK, APC, ATM, AXIN2,BAP1,  BARD1, BLM, BMPR1A, BRCA1, BRCA2, BRIP1, CASR, CDC73, CDH1, CDK4, CDKN1B, CDKN1C, CDKN2A (p14ARF), CDKN2A (p16INK4a), CEBPA, CHEK2, CTNNA1, DICER1, DIS3L2, EGFR (c.2369C>T, p.Thr790Met variant only), EPCAM (Deletion/duplication testing only), FH, FLCN, GATA2, GPC3, GREM1 (Promoter region deletion/duplication testing only), HOXB13 (c.251G>A, p.Gly84Glu), HRAS, KIT, MAX, MEN1, MET, MITF (c.952G>A, p.Glu318Lys variant only), MLH1, MSH2, MSH3, MSH6, MUTYH, NBN, NF1, NF2, NTHL1, PALB2, PDGFRA, PHOX2B, PMS2, POLD1, POLE, POT1, PRKAR1A, PTCH1, PTEN, RAD50, RAD51C, RAD51D, RB1, RECQL4, RET, RNF43, RUNX1, SDHAF2, SDHA (sequence changes only), SDHB, SDHC, SDHD, SMAD4, SMARCA4, SMARCB1, SMARCE1, STK11, SUFU, TERC, TERT, TMEM127, TP53, TSC1, TSC2, VHL, WRN and WT1.   Amended report: The variant of uncertain significance (VUS) in [place] at  c.2825+6G>T (Intronic) has been reclassified to likely benign.  The change in variant classification was made as a result of re-review of evidence in light of new variant interpretation guidelines and/or new information. The amended report date is December 18, 2020.    02/10/2021 - 03/24/2021 Radiation Therapy   Site Technique Total Dose (Gy) Dose per Fx (Gy) Completed Fx Beam Energies  Breast, Right: Breast_Rt 3D 50/50 2 25/25 10X  Breast, Right: Breast_Rt_PAB_SCV 3D 50/50 2 25/25 6X, 15X  Breast, Right: Breast_Rt_Bst 3D 10/10 2 5/5 6X, 10X     04/13/2021 -  Anti-estrogen oral therapy   Initially anastrozole switched to letrozole for fatigue issues     REVIEW OF SYSTEMS:   Constitutional: Denies fevers, chills or abnormal weight loss Eyes:  Denies blurriness of vision Ears, nose, mouth, throat, and face: Denies mucositis or sore  throat Respiratory: Denies cough, dyspnea or wheezes Cardiovascular: Denies palpitation, chest discomfort Gastrointestinal:  Denies nausea, heartburn or change in bowel habits Skin: Denies abnormal skin rashes Lymphatics: Denies new lymphadenopathy or easy bruising Neurological:Denies numbness, tingling or new weaknesses Behavioral/Psych: Mood is stable, no new changes  Extremities: No lower extremity edema Breast: *** denies any pain or lumps or nodules in either breasts All other systems were reviewed with the patient and are negative. Observations/Objective:     Assessment Plan:  No problem-specific Assessment & Plan notes found for this encounter.    I discussed the assessment and treatment plan with the patient. The patient was provided an opportunity to ask questions and all were answered. The patient agreed with the plan and demonstrated an understanding of the instructions. The patient was advised to call back or seek an in-person evaluation if the symptoms worsen or if the condition fails to improve as anticipated.   I provided *** minutes of non-face-to-face time during this encounter. Monica [name] Bryson Ha, CMA   I Gardiner Coins am scribing for Dr. Lindi Adie  ***

## 2022-05-20 ENCOUNTER — Inpatient Hospital Stay: Payer: Medicaid Other | Admitting: Hematology and Oncology

## 2022-05-20 NOTE — Assessment & Plan Note (Signed)
09/10/2020:Right lumpectomy ([facility]): invasive lobular carcinoma, 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??ER 80%, PR 60%, Ki-67 15%, HER-2 equivocal by IHC positive for fish ?10/11/20: Re-excision: Neg ?? ?Recommendation: ?1.?Adjuvant chemotherapy with Taxol Herceptin followed by Herceptin maintenance for 1 year ?2. Adjuvant radiation therapy?02/10/21-?03/24/21 ?3. Adjuvant antiestrogen therapy?with anastrozole (patient is postmenopausal)?started 04/13/2021 ?and anti-HER-2 therapy with neratinib ?-------------------------------------------------------------------------------------------------------------------------------- ?Current treatment:?Herceptin maintenance q 3 weeks?with anastrozole, switched to letrozole 07/06/2021?Herceptin maintenance completed November 2022 ?Echocardiogram on October 06, 2021 shows an EF of 60 to 65% ?? ?Letrozole?Toxicities:????We are holding the letrozole right now due to the left hip pain.  She will resume letrozole today. ?? ?Left lateral leg numbness/pain:?09/23/2021: Hip x-ray: Negative, treated with Medrol dose pack?without any relief.????She was seen by Dr. Corey?and received a cortisone injection on October 09, 2021. ?MRI left hip 11/09/2021: No acute osseous abnormality.  Solitary nonspecific 6 mm lesion intertrochanteric left femur (likely benign) tendinosis with partial-thickness interstitial tearing  bilateral hamstring tendon origins right greater than left ? ?02/15/22: Scattered tiny right-sided pulmonary nodules measuring up to 3 mm, nonspecific and possibly infectious or inflammatory, Tiny sclerotic lesion in the inter trochanteric left femur measures 6 mm, unchanged since MRI hip November 07, 2021 and likely reflecting a small enchondroma ? ?Discussed the pros and cons of using neratinib ?She is going to have spine surgery coming up.  Therefore we will hold off on neratinib at this time. ? ?Plan: 3-month CT chest for the  lung nodule evaluation and telephone visit day after to discuss results. ?

## 2022-05-21 ENCOUNTER — Encounter (HOSPITAL_COMMUNITY): Payer: Self-pay

## 2022-05-21 ENCOUNTER — Ambulatory Visit (HOSPITAL_COMMUNITY)
Admission: RE | Admit: 2022-05-21 | Discharge: 2022-05-21 | Disposition: A | Discharge status: Home / Self Care | Payer: Medicaid Other | Source: Ambulatory Visit | Attending: Hematology and Oncology | Admitting: Hematology and Oncology

## 2022-05-21 DIAGNOSIS — C50411 Malignant neoplasm of upper-outer quadrant of right female breast: Secondary | ICD-10-CM | POA: Diagnosis present

## 2022-05-21 IMAGING — CT CT CHEST W/ CM
2 of 4 series · 14 of 36 positions shown, 17 images · IV contrast (OMNIPAQUE)
Comparison: Chest CT [DATE]

CLINICAL DATA: Follow-up metastatic breast cancer.

EXAM:
CT CHEST WITH CONTRAST
TECHNIQUE: Multidetector CT imaging of the chest was performed during
intravenous contrast administration.

[Series 2: axial st · axial · 0.70mm/px · z∈[-303,-57]mm · 11 of 145 slices shown, 14 images]
[im 11/145  mediastinal]
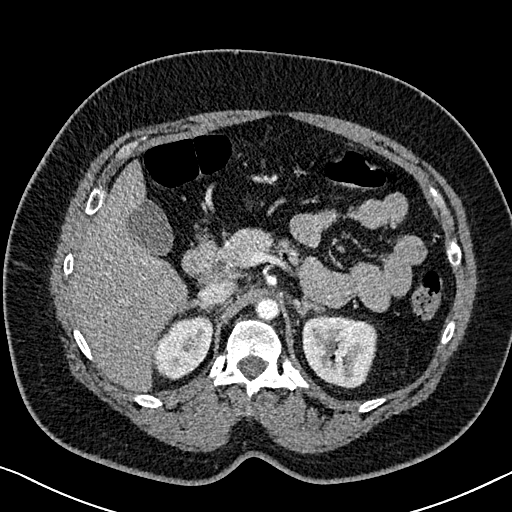
[im 11/145  lung]
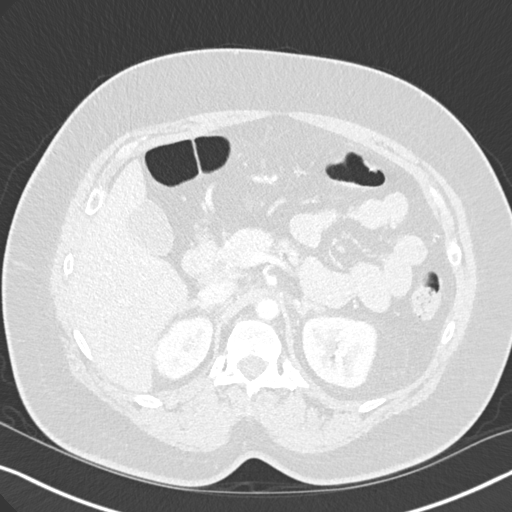
[im 21/145  lung]
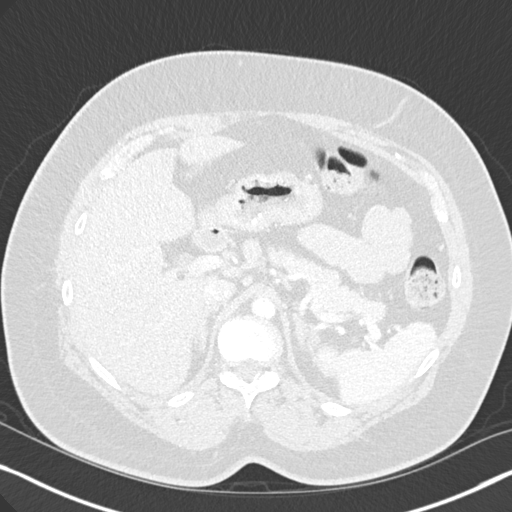
[im 31/145  lung]
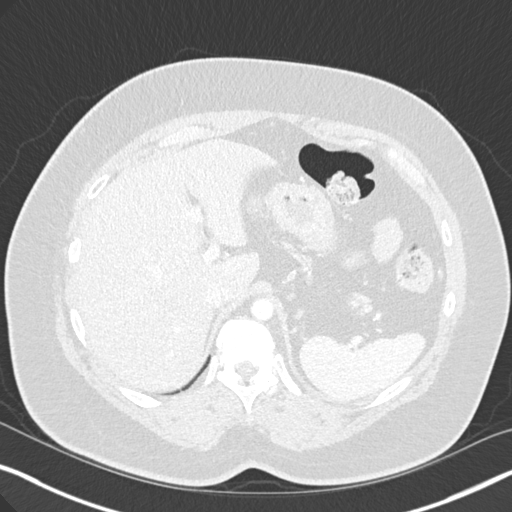
[im 52/145  lung]
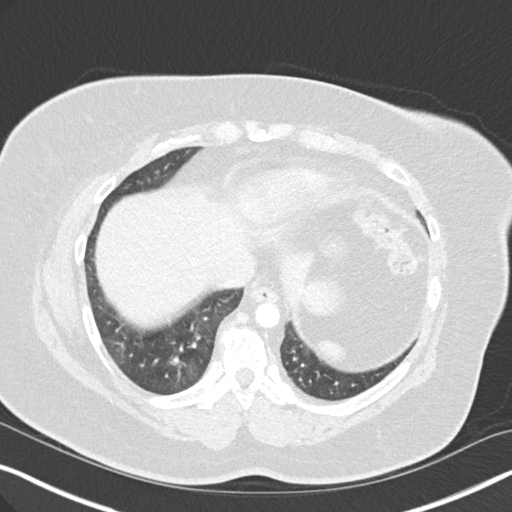
[im 62/145  mediastinal]
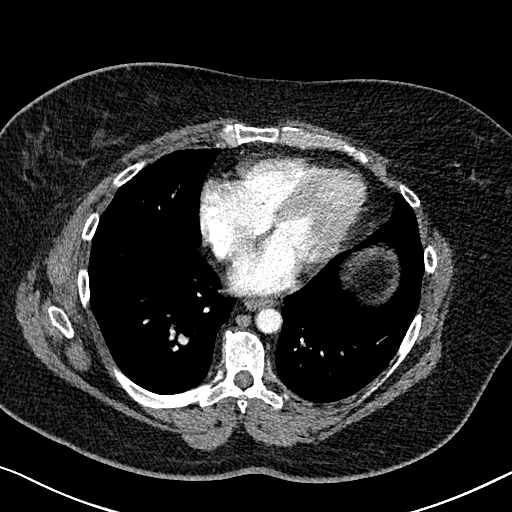
[im 62/145  lung]
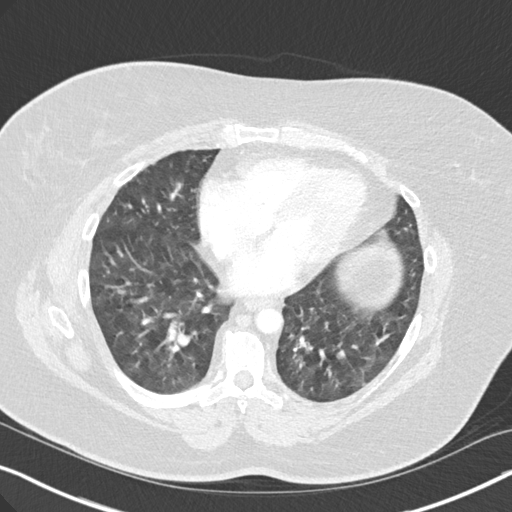
[im 73/145  lung]
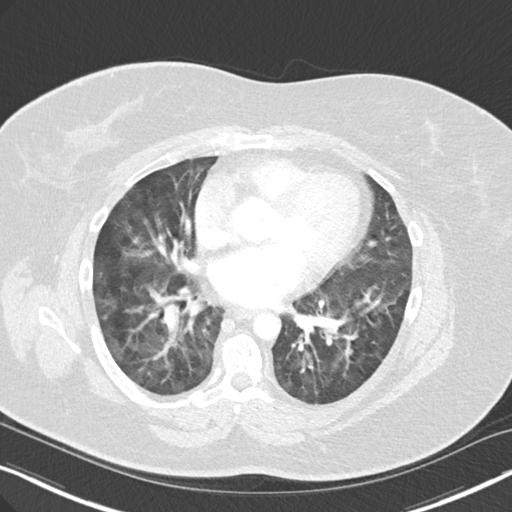
[im 83/145  lung]
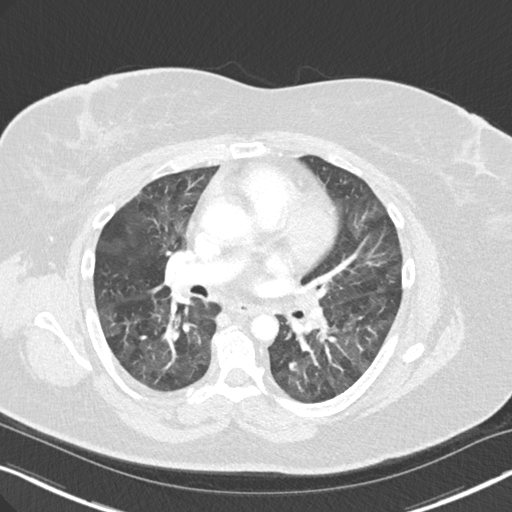
[im 93/145  lung]
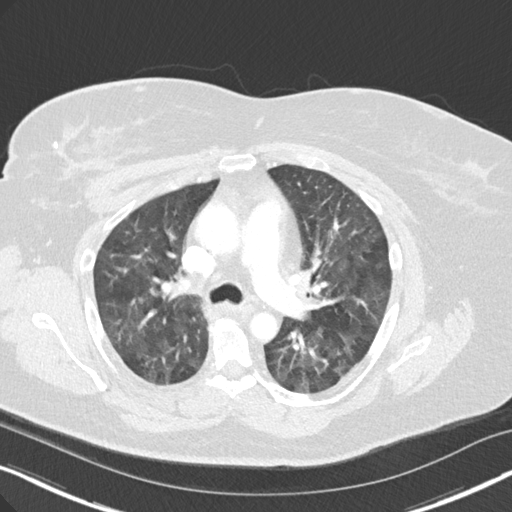
[im 114/145  mediastinal]
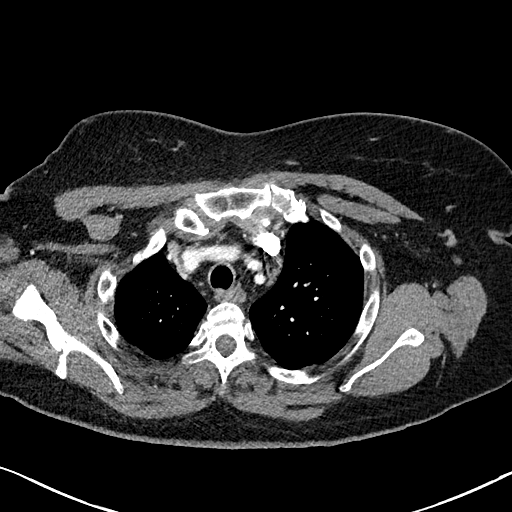
[im 114/145  lung]
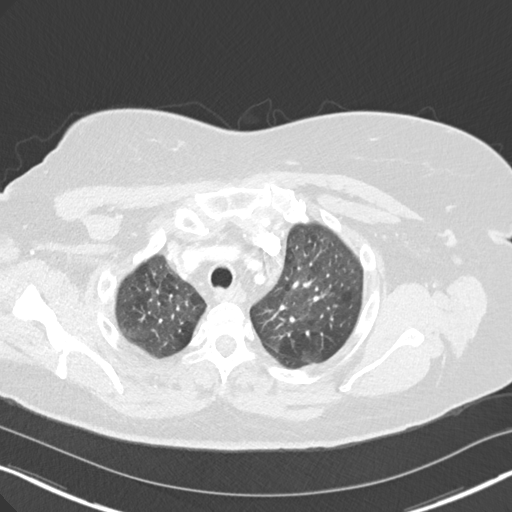
[im 124/145  lung]
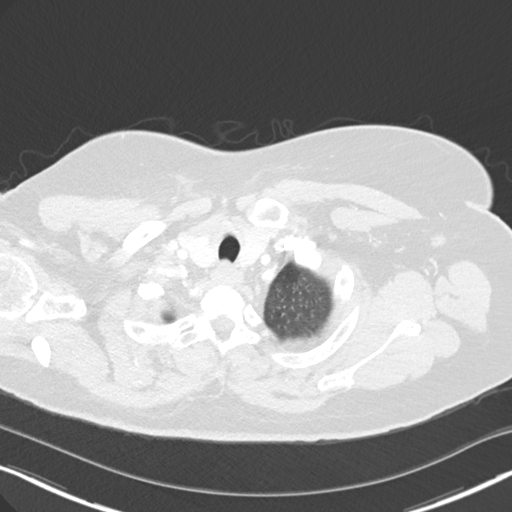
[im 134/145  lung]
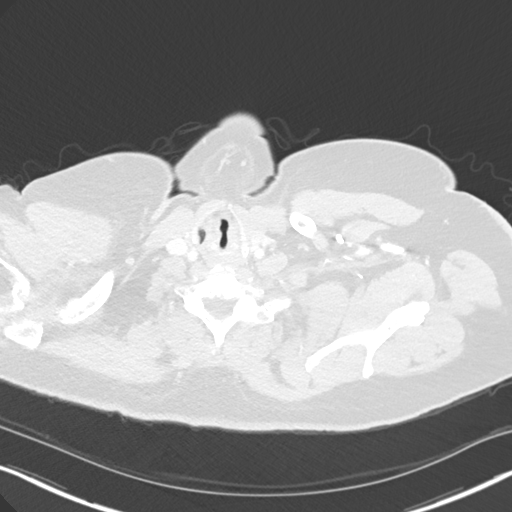

[Series 5: coronal · coronal · 0.57mm/px · 3 of 141 slices shown]
[im 29/141  lung]
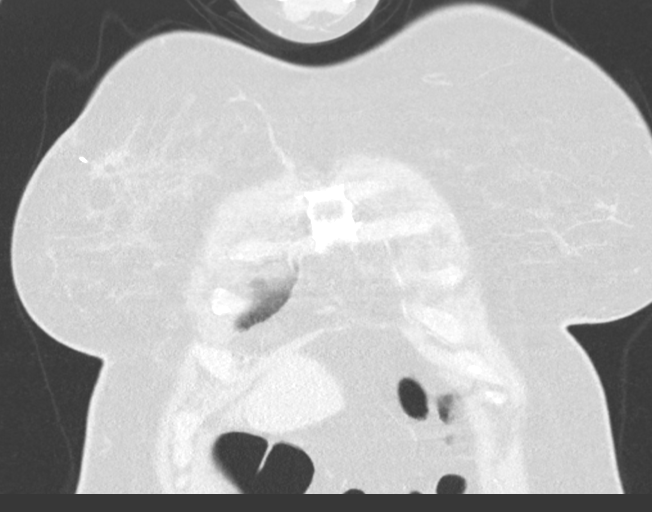
[im 57/141  lung]
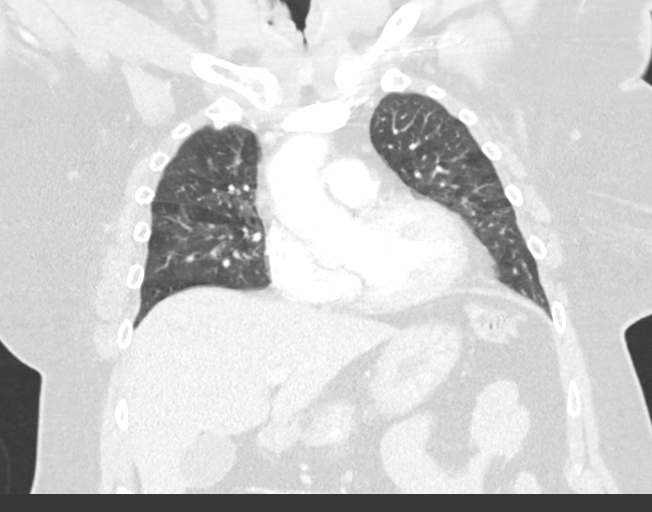
[im 85/141  lung]
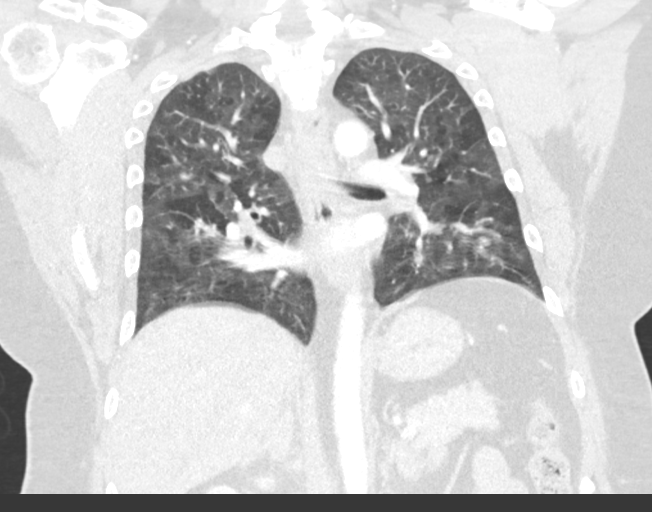

[14 of 36 positions shown; findings below may reference images not displayed]

RADIATION DOSE REDUCTION: This exam was performed according to the
departmental dose-optimization program which includes automated
exposure control, adjustment of the mA and/or kV according to
patient size and/or use of iterative reconstruction technique.

CONTRAST:  75mL OMNIPAQUE IOHEXOL 300 MG/ML  SOLN
FINDINGS: Cardiovascular: The heart is normal in size. No pericardial
effusion. The aorta is normal in caliber. No dissection. The branch
vessels are patent. The pulmonary arteries are unremarkable.

Mediastinum/Nodes: Stable scattered borderline mediastinal and hilar
lymph nodes most likely related to underlying lung changes. The
esophagus is grossly. The thyroid gland is unremarkable.

Lungs/Pleura: Progressive pattern mosaic ground-glass attenuation
lungs when compared to the prior study. Findings could be due to
worsening small airways disease such as asthma or respiratory
bronchiolitis. Other possibilities would include cryptogenic
organizing pneumonia and hypersensitivity pneumonitis. No focal
airspace consolidation to suggest typical pneumonia. A few small
scattered sub 4 mm pulmonary nodules are unchanged. No findings
suspicious for pulmonary metastatic disease. No pleural effusions or
pleural nodules.

Upper Abdomen: No significant upper abdominal findings. Stable
borderline celiac axis and hepatoduodenal ligament lymph nodes. No
hepatic or adrenal gland lesions.

Musculoskeletal: Stable surgical and probable radiation changes
involving the right breast. No findings suspicious for chest wall
recurrence. No axillary or supraclavicular adenopathy. The bony
thorax is intact. No worrisome lytic sclerotic bone lesions.
IMPRESSION: 1. Progressive pattern of mosaic ground-glass attenuation in the
lungs could be due to worsening small airways disease such as asthma
or respiratory bronchiolitis. Other possibilities would include
cryptogenic organizing pneumonia and hypersensitivity pneumonitis.
2. A few small scattered sub 4 mm pulmonary nodules unchanged. No
findings suspicious for pulmonary metastatic disease.
3. Stable surgical and probable radiation changes involving the
right breast but no findings suspicious for chest wall recurrence,
locoregional adenopathy or metastatic pulmonary disease.
4. Stable borderline mediastinal and hilar lymph nodes.
5. Stable borderline celiac axis and hepatoduodenal ligament lymph
nodes.

Aortic Atherosclerosis ([RA]-[RA]).

## 2022-05-21 MED ORDER — SODIUM CHLORIDE (PF) 0.9 % IJ SOLN
INTRAMUSCULAR | Status: AC
  Filled 2022-05-21: qty 50

## 2022-05-21 MED ORDER — IOHEXOL 300 MG/ML  SOLN
75.0000 mL | Freq: Once | INTRAMUSCULAR | Status: AC | PRN
  Administered 2022-05-21: 75 mL via INTRAVENOUS

## 2022-05-24 ENCOUNTER — Encounter: Payer: Self-pay | Admitting: Hematology and Oncology

## 2022-05-24 ENCOUNTER — Inpatient Hospital Stay: Payer: Medicaid Other | Attending: Hematology and Oncology | Admitting: Hematology and Oncology

## 2022-05-24 DIAGNOSIS — J189 Pneumonia, unspecified organism: Secondary | ICD-10-CM

## 2022-05-24 DIAGNOSIS — C50411 Malignant neoplasm of upper-outer quadrant of right female breast: Secondary | ICD-10-CM

## 2022-05-24 DIAGNOSIS — R918 Other nonspecific abnormal finding of lung field: Secondary | ICD-10-CM

## 2022-05-24 DIAGNOSIS — Z170 Estrogen receptor positive status [ER+]: Secondary | ICD-10-CM | POA: Diagnosis not present

## 2022-05-24 NOTE — Assessment & Plan Note (Signed)
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 Adjuvant radiation therapy3/8/22-4/19/22 3. Adjuvant antiestrogen therapywith anastrozole (patient is postmenopausal)started 04/13/2021 and anti-HER-2 therapy with neratinib -------------------------------------------------------------------------------------------------------------------------------- Current treatment:Herceptin maintenance q 3 weekswith anastrozole, switched to letrozole 8/1/2022Herceptin maintenance completed November 2022  LetrozoleToxicities:  Left lateral leg numbness/pain:09/23/2021: Hip x-ray: Negative, treated with Medrol dose packwithout any relief.She was seen by Dr. Leeanne Rio received a cortisone injection on October 09, 2021. MRI left hip 11/09/2021: No acute osseous abnormality. Solitary nonspecific 6 mm lesion intertrochanteric left femur (likely benign) tendinosis with partial-thickness interstitial tearing bilateral hamstring tendon origins right greater than left  02/15/22: Scattered tiny right-sided pulmonary nodules measuring up to 3 mm, nonspecific and possibly infectious or inflammatory, Tiny sclerotic lesion in the inter trochanteric left femur measures 6 mm, unchanged since MRI hip November 07, 2021 and likely reflecting a small enchondroma  05/23/2022: Progression of groundglass attenuation in the lungs possibly hypersensitivity pneumonitis, unchanged few small lung nodules stable borderline lymph nodes  Recommendation: Pulmonary referral for the interstitial lung changes

## 2022-05-24 NOTE — Progress Notes (Signed)
HEMATOLOGY-ONCOLOGY TELEPHONE VISIT PROGRESS NOTE  I connected with Monica [name] on 05/24/22 at  2:15 PM EDT by telephone and verified that I am speaking with the correct person using two identifiers.  I discussed the limitations, risks, security and privacy concerns of performing an evaluation and management service by telephone and the availability of in person appointments.  I also discussed with the patient that there may be a patient responsible charge related to this service. The patient expressed understanding and agreed to proceed.   History of Present Illness:   Oncology History  Primary malignant neoplasm of upper outer quadrant of female breast, right ([place])  08/12/2020 Initial Diagnosis   Screening mammogram on 07/07/20 showed an asymmetry. Diagnostic mammogram and [place] on 07/29/20 showed a 0.8cm mass at the 10 o'clock position and no right axillary adenopathy. Biopsy on 08/12/20 showed invasive and in situ mammary carcinoma, grade 2, HER-2 equivocal by IHC, positive by FISH, ER+ 80%, PR+ 60%, Ki67 15%.   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  Variants of uncertain significance detected in ATM (c.131A>G (p.Asp44Gly) and c.4658A>C (p.Glu1553Ala)) and WRN (c.2825+6G>T (intronic)).  The report date is October 02, 2020.   The Multi-Cancer Panel offered by [facility] includes  sequencing and/or deletion duplication testing of the following 85 genes: AIP, ALK, APC, ATM, AXIN2,BAP1,  BARD1, BLM, BMPR1A, BRCA1, BRCA2, BRIP1, CASR, CDC73, CDH1, CDK4, CDKN1B, CDKN1C, CDKN2A (p14ARF), CDKN2A (p16INK4a), CEBPA, CHEK2, CTNNA1, DICER1, DIS3L2, EGFR (c.2369C>T, p.Thr790Met variant only), EPCAM (Deletion/duplication testing only), FH, FLCN, GATA2, GPC3, GREM1 (Promoter region deletion/duplication testing only), HOXB13 (c.251G>A, p.Gly84Glu), HRAS, KIT, MAX, MEN1, MET, MITF (c.952G>A, p.Glu318Lys variant only), MLH1, MSH2, MSH3, MSH6, MUTYH, NBN, NF1, NF2, NTHL1, PALB2, PDGFRA, PHOX2B, PMS2, POLD1, POLE, POT1, PRKAR1A, PTCH1, PTEN, RAD50, RAD51C, RAD51D, RB1, RECQL4, RET, RNF43, RUNX1, SDHAF2, SDHA (sequence changes only), SDHB, SDHC, SDHD, SMAD4, SMARCA4, SMARCB1, SMARCE1, STK11, SUFU, TERC, TERT, TMEM127, TP53, TSC1, TSC2, VHL, WRN and WT1.   Amended report: The variant of uncertain significance (VUS) in [place] at  c.2825+6G>T (Intronic) has been reclassified to likely benign.  The change in variant classification was made as a result of re-review of evidence in light of new variant interpretation guidelines and/or new information. The amended report date is December 18, 2020.    02/10/2021 - 03/24/2021 Radiation Therapy   Site Technique Total Dose (Gy) Dose per Fx (Gy) Completed Fx Beam Energies  Breast, Right: Breast_Rt 3D 50/50 2 25/25 10X  Breast, Right: Breast_Rt_PAB_SCV 3D 50/50 2 25/25 6X, 15X  Breast, Right: Breast_Rt_Bst 3D 10/10 2 5/5 6X, 10X     04/13/2021 -  Anti-estrogen oral therapy   Initially anastrozole switched to letrozole for fatigue issues     REVIEW OF SYSTEMS:   Constitutional: Denies fevers, chills or abnormal weight loss  All other systems were reviewed with the patient and are negative.  Observations/Objective:     Assessment Plan:  Primary malignant neoplasm of  upper outer quadrant of female breast, right ([place]) 09/10/2020:Right lumpectomy ([facility]):  invasive lobular carcinoma, 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  ER 80%, PR 60%, Ki-67 15%, HER-2 equivocal by IHC positive for fish 10/11/20: Re-excision: Neg   Recommendation: 1. Adjuvant chemotherapy with Taxol Herceptin followed by Herceptin maintenance for 1 year 2. Adjuvant radiation therapy 02/10/21- 03/24/21 3. Adjuvant antiestrogen therapy with anastrozole (patient is postmenopausal) started 04/13/2021 and anti-HER-2 therapy with neratinib -------------------------------------------------------------------------------------------------------------------------------- Current treatment: Herceptin maintenance q 3 weeks with anastrozole, switched to letrozole 07/06/2021 Herceptin maintenance completed November 2022   Letrozole Toxicities: Fatigue   Left lateral leg numbness/pain: 09/23/2021: Hip x-ray: Negative, treated with Medrol dose pack without any relief.    She was seen by Dr. Georgina Snell and received a cortisone injection on October 09, 2021. MRI left hip 11/09/2021: No acute osseous abnormality.  Solitary nonspecific 6 mm lesion intertrochanteric left femur (likely benign) tendinosis with partial-thickness interstitial tearing  bilateral hamstring tendon origins right greater than left   02/15/22: Scattered tiny right-sided pulmonary nodules measuring up to 3 mm, nonspecific and possibly infectious or inflammatory, Tiny sclerotic lesion in the inter trochanteric left femur measures 6 mm, unchanged since MRI hip November 07, 2021 and likely reflecting a small enchondroma   05/23/2022: Progression of groundglass attenuation in the lungs possibly hypersensitivity pneumonitis, unchanged few small lung nodules stable borderline lymph nodes.  Back pain with numbness: Needs spine surgery.  Recommendation: Pulmonary referral for the interstitial lung changes   I discussed the assessment and treatment plan with the patient. The patient was  provided an opportunity to ask questions and all were answered. The patient agreed with the plan and demonstrated an understanding of the instructions. The patient was advised to call back or seek an in-person evaluation if the symptoms worsen or if the condition fails to improve as anticipated.   I provided 12 minutes of non-face-to-face time during this encounter. Harriette Ohara, MD

## 2022-05-24 NOTE — Progress Notes (Signed)
This encounter was created in error - please disregard.

## 2022-05-25 ENCOUNTER — Telehealth: Payer: Self-pay | Admitting: Hematology and Oncology

## 2022-05-25 NOTE — Telephone Encounter (Signed)
Scheduled appointment per 6/19 los. Patient is aware.

## 2022-05-28 ENCOUNTER — Emergency Department (HOSPITAL_COMMUNITY): Payer: Medicaid Other

## 2022-05-28 ENCOUNTER — Other Ambulatory Visit: Payer: Self-pay

## 2022-05-28 ENCOUNTER — Emergency Department (HOSPITAL_COMMUNITY)
Admission: EM | Admit: 2022-05-28 | Discharge: 2022-05-28 | Disposition: A | Discharge status: Home / Self Care | Payer: Medicaid Other | Attending: Emergency Medicine | Admitting: Emergency Medicine

## 2022-05-28 ENCOUNTER — Encounter (HOSPITAL_COMMUNITY): Payer: Self-pay

## 2022-05-28 DIAGNOSIS — Z853 Personal history of malignant neoplasm of breast: Secondary | ICD-10-CM | POA: Diagnosis not present

## 2022-05-28 DIAGNOSIS — J45909 Unspecified asthma, uncomplicated: Secondary | ICD-10-CM | POA: Insufficient documentation

## 2022-05-28 DIAGNOSIS — Z79899 Other long term (current) drug therapy: Secondary | ICD-10-CM | POA: Diagnosis not present

## 2022-05-28 DIAGNOSIS — M25512 Pain in left shoulder: Secondary | ICD-10-CM | POA: Insufficient documentation

## 2022-05-28 IMAGING — DX DG SHOULDER 2+V*L*
2 series · 2 of 2 positions shown · non-contrast
Comparison: None Available.

CLINICAL DATA: Left shoulder pain.

EXAM:
LEFT SHOULDER - 2+ VIEW

[shoulder grashey]
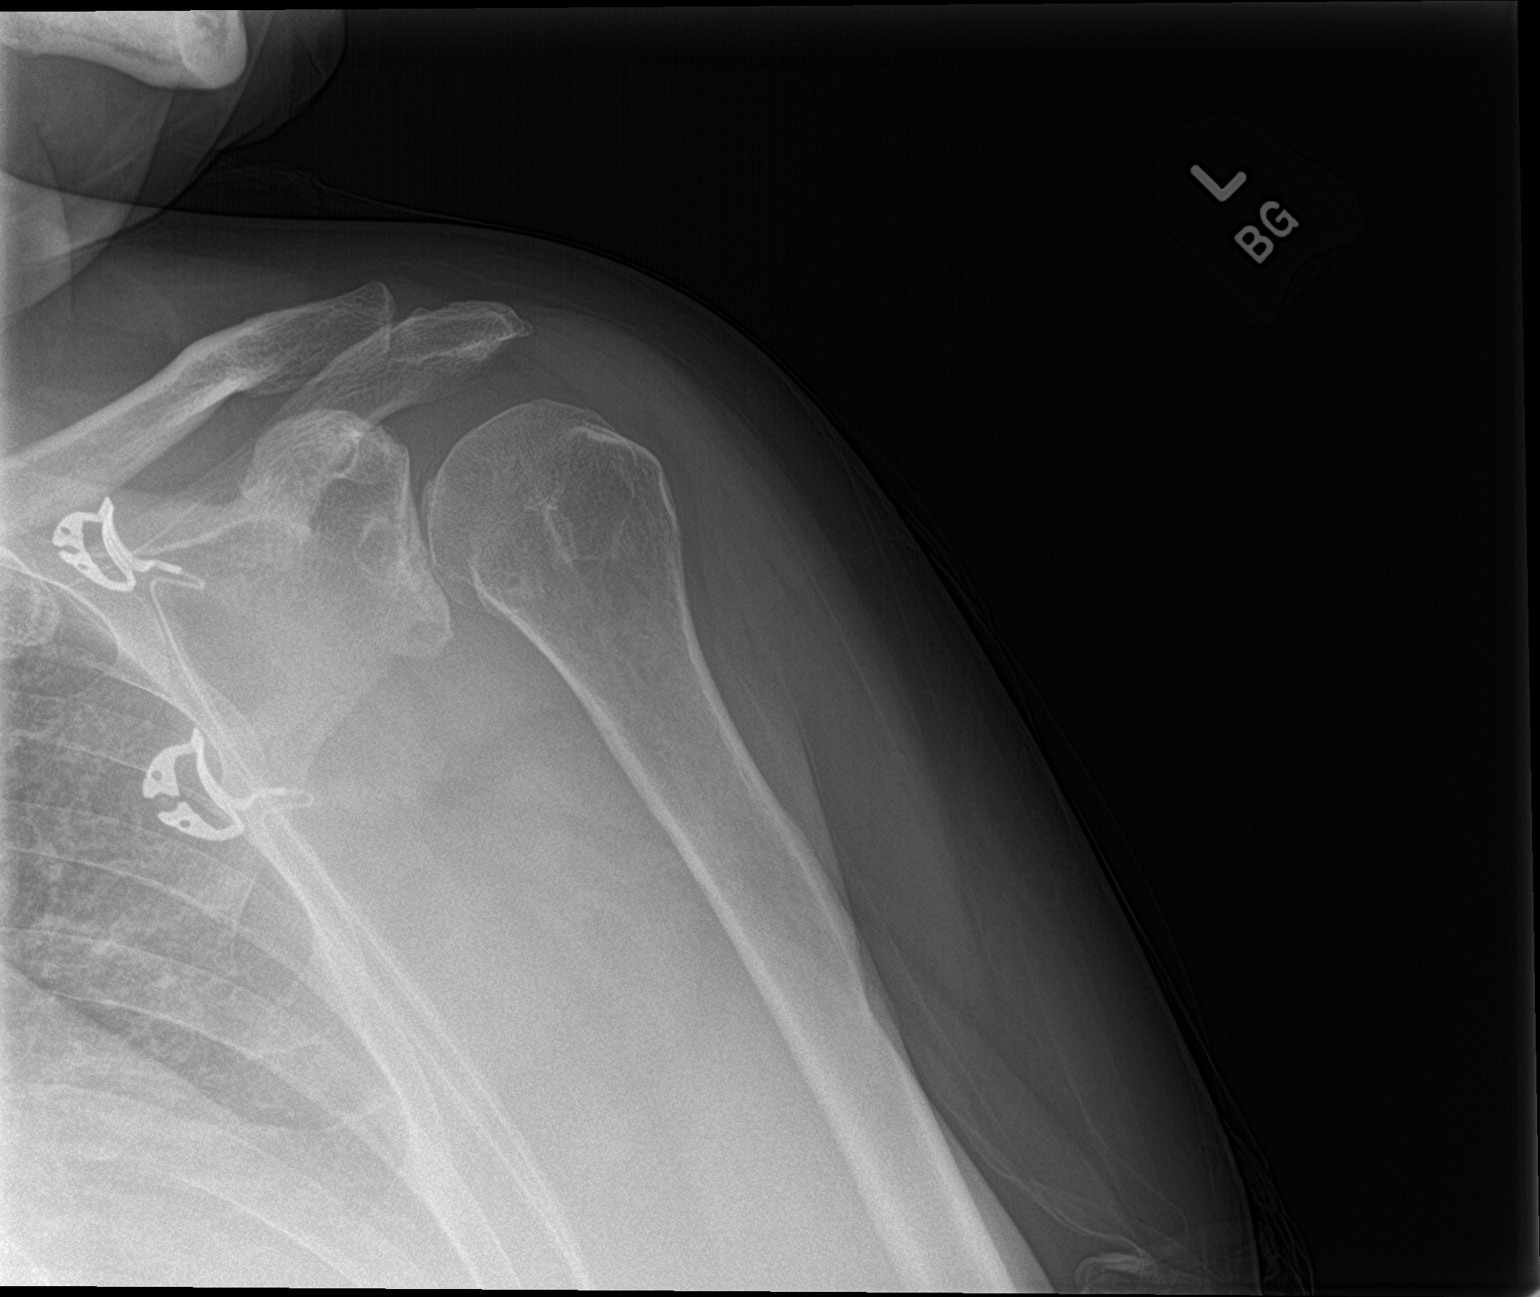

[shoulder y view]
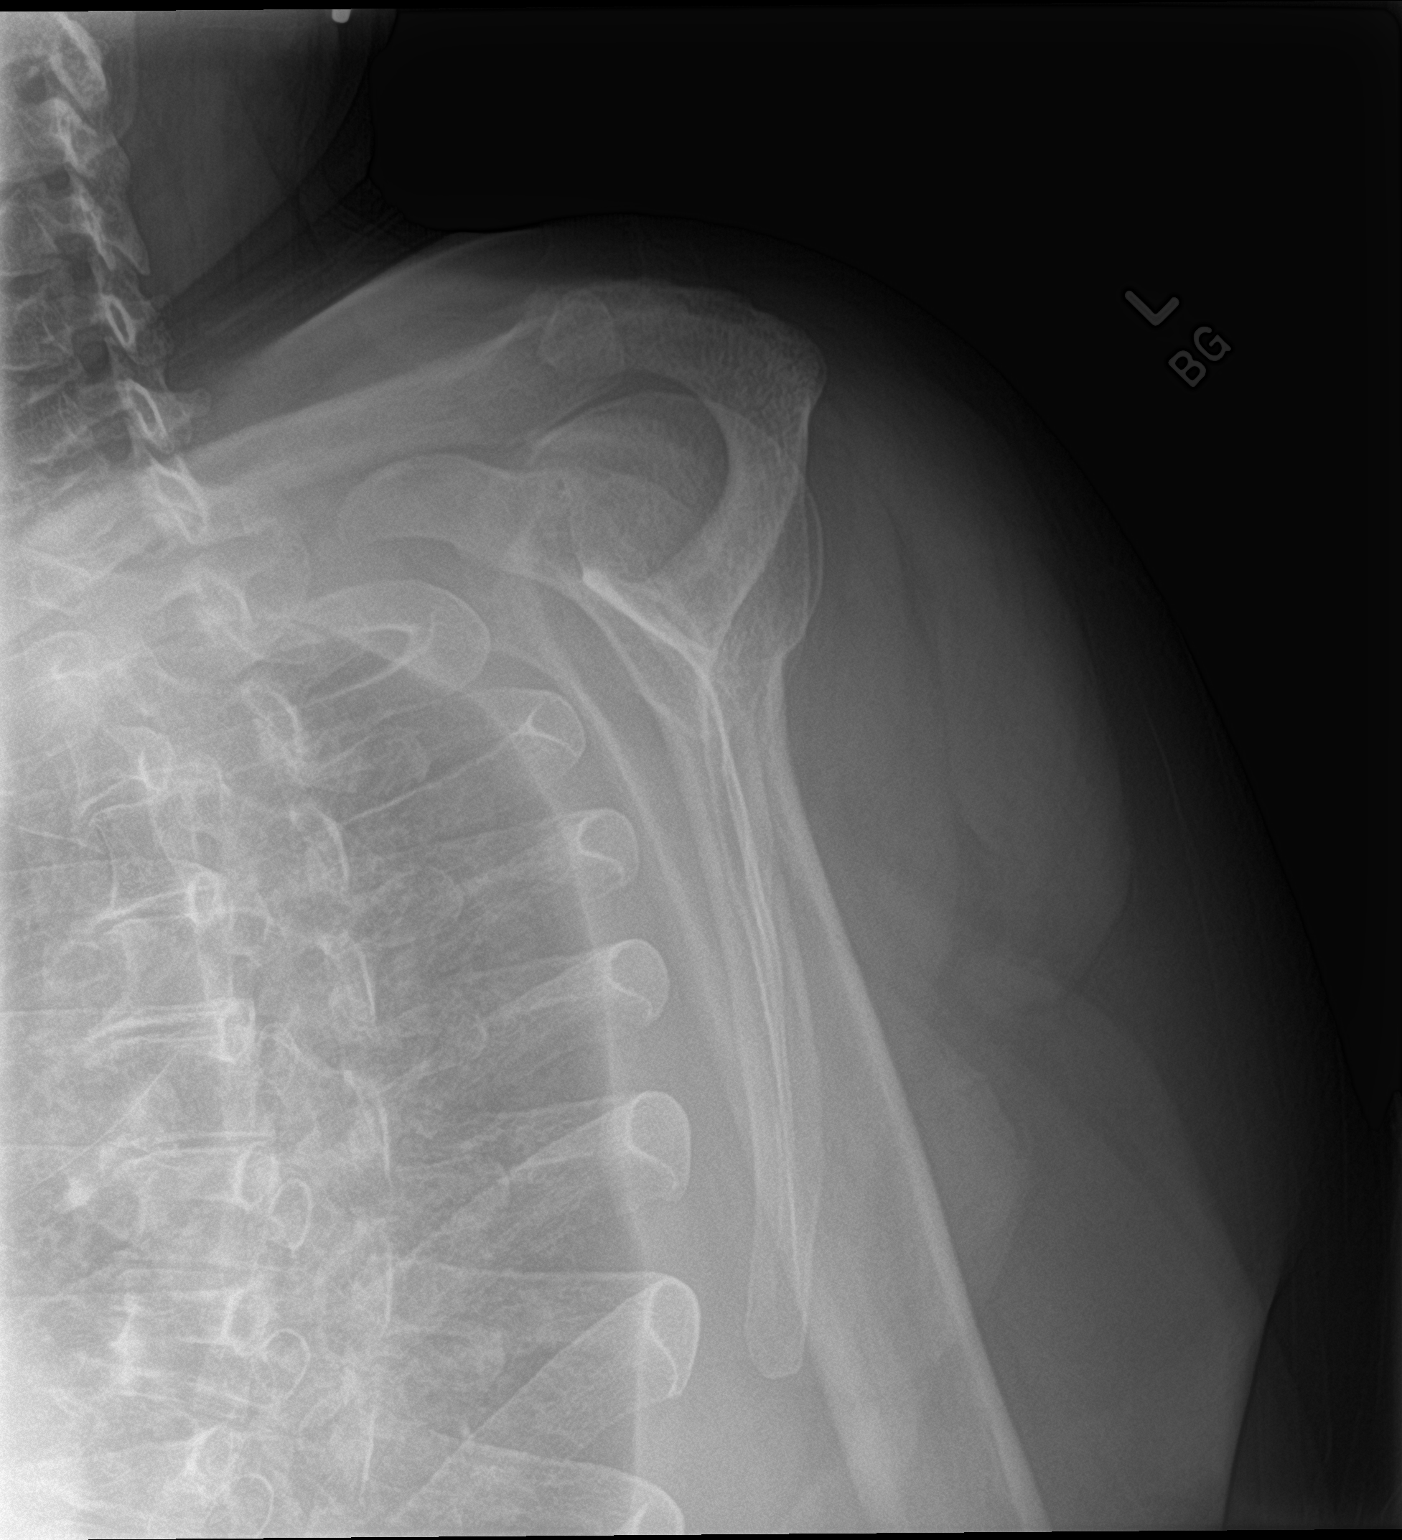

[2 of 2 positions shown; findings below may reference images not displayed]

FINDINGS: There is no evidence of fracture or dislocation. Glenohumeral joint
space narrowing. Acromioclavicular joint is unremarkable. Soft
tissues are unremarkable.
IMPRESSION: 1.  No evidence of fracture or dislocation.

2.  Mild-to-moderate glenohumeral osteoarthritis.

## 2022-05-28 MED ORDER — KETOROLAC TROMETHAMINE 30 MG/ML IJ SOLN
30.0000 mg | Freq: Once | INTRAMUSCULAR | Status: AC
  Administered 2022-05-28: 30 mg via INTRAMUSCULAR
  Filled 2022-05-28: qty 1

## 2022-06-02 ENCOUNTER — Ambulatory Visit
Admission: RE | Admit: 2022-06-02 | Discharge: 2022-06-02 | Disposition: A | Discharge status: Home / Self Care | Payer: Medicaid Other | Source: Ambulatory Visit | Attending: Adult Health | Admitting: Adult Health

## 2022-06-02 DIAGNOSIS — E2839 Other primary ovarian failure: Secondary | ICD-10-CM

## 2022-06-02 NOTE — Progress Notes (Unsigned)
I, Wendy Poet, LAT, ATC, am serving as scribe for Dr. Lynne Leader.  Monica [name] is a 51 y.o. female who presents to [place] at [facility] today for f/u of L shoulder pain after being seen at the [facility] ED on 05/28/22.  She was last seen by Dr. Georgina Snell on 01/27/22 for lumbar radiculopathy.  Today, pt reports L shoulder pain since 05/26/22 that began after lifting her 38# grandson.  She reports entire L-sided pain in her L arm and L leg.  She is supposed to be getting spine surgery but has not had it yet.  She was referred to [facility] in Feb 2023 for L-sided lumbar radiculopathy.  Her biggest c/o is numbness on her entire L side and not being able to sit or stand for any significant period of time.  She con't to take Gabapentin but it's not working as well as previously.    Diagnostic testing: L shoulder XR- 05/28/22  Pertinent review of systems: No fevers or chills  Relevant historical information: Breast cancer.  History of spinal listhesis with lumbar radiculopathy.   Exam:  BP (!) 130/92 (BP Location: Left Arm, Patient Position: Sitting, Cuff Size: Normal)   Pulse 86   Ht '5\' 6"'$  (1.676 m)   Wt 190 lb (86.2 kg)   SpO2 99%   BMI 30.67 kg/m  General: Well Developed, well nourished, and in no acute distress.   MSK: C-spine: Normal. Nontender midline. Normal cervical motion. Left upper extremity strength diminished shoulder abduction and external rotation. Distally upper extremity strength is intact left arm. Reflexes are intact.  Left shoulder: Normal appearing Range of motion abduction 90 degrees.  External rotation full internal rotation lumbar spine. Positive Hawkins and Neer's test. Strength abduction 4/5 external rotation 4/5 internal rotation 5/5. Pulses and capillary fill are intact distally.  Negative Tinel's at carpal tunnel.   Lab and Radiology Results  Procedure: Real-time Ultrasound Guided Injection of the left  shoulder subacromial bursa Device: Philips Affiniti 50G Images permanently stored and available for review in PACS Verbal informed consent obtained.  Discussed risks and benefits of procedure. Warned about infection, bleeding, hyperglycemia damage to structures among others. Patient expresses understanding and agreement Time-out conducted.   Noted no overlying erythema, induration, or other signs of local infection.   Skin prepped in a sterile fashion.   Local anesthesia: Topical Ethyl chloride.   With sterile technique and under real time ultrasound guidance: 40 mg of Kenalog and 2 mL of Marcaine injected into subacromial bursa. Fluid seen entering the bursa.   Completed without difficulty   Pain moderately  resolved suggesting accurate placement of the medication.   Advised to call if fevers/chills, erythema, induration, drainage, or persistent bleeding.   Images permanently stored and available for review in the ultrasound unit.  Impression: Technically successful ultrasound guided injection.   X-ray images left shoulder and C-spine obtained today personally and independently interpreted  C-spine: Loss of cervical lordosis indicating spasm. No acute fractures or aggressive appearing bony lesions.  Mild DDD C6-7  Left shoulder: Mild glenohumeral DJD.  No acute fractures are present.  No aggressive appearing bony lesions are present.  Await formal radiology review      Assessment and Plan: 52 y.o. female with left shoulder and arm pain.  Pain thought to be multifactorial.  I do believe the majority of her pain is due to left shoulder rotator cuff tendinopathy bursitis and impingement.  Some of her pain may be cervical  radiculopathy at C7 and C8.  Alternative diagnosis could include carpal tunnel syndrome to explain some of her left arm pain as well.  Plan for subacromial injection and check back in 1 month.   PDMP not reviewed this encounter. Orders Placed This Encounter   Procedures   [place] LIMITED JOINT SPACE STRUCTURES UP LEFT(NO LINKED CHARGES)    Order Specific Question:   Reason for Exam (SYMPTOM  OR DIAGNOSIS REQUIRED)    Answer:   L shoulder pain    Order Specific Question:   Preferred imaging location?    Answer:   [facility]   DG Cervical Spine 2 or 3 views    Standing Status:   Future    Number of Occurrences:   1    Standing Expiration Date:   07/03/2022    Order Specific Question:   Reason for Exam (SYMPTOM  OR DIAGNOSIS REQUIRED)    Answer:   L UE pain    Order Specific Question:   Is patient pregnant?    Answer:   No    Order Specific Question:   Preferred imaging location?    Answer:   [facility]   DG Shoulder Left    Standing Status:   Future    Number of Occurrences:   1    Standing Expiration Date:   07/03/2022    Order Specific Question:   Reason for Exam (SYMPTOM  OR DIAGNOSIS REQUIRED)    Answer:   L shoulder pain    Order Specific Question:   Is patient pregnant?    Answer:   No    Order Specific Question:   Preferred imaging location?    Answer:   [facility]   No orders of the defined types were placed in this encounter.    Discussed warning signs or symptoms. Please see discharge instructions. Patient expresses understanding.   The above documentation has been reviewed and is accurate and complete Lynne Leader, M.D.

## 2022-06-03 ENCOUNTER — Ambulatory Visit: Payer: Self-pay

## 2022-06-03 ENCOUNTER — Encounter: Payer: Self-pay | Admitting: Family Medicine

## 2022-06-03 ENCOUNTER — Ambulatory Visit (INDEPENDENT_AMBULATORY_CARE_PROVIDER_SITE_OTHER): Payer: Medicaid Other | Admitting: Family Medicine

## 2022-06-03 ENCOUNTER — Ambulatory Visit (INDEPENDENT_AMBULATORY_CARE_PROVIDER_SITE_OTHER): Payer: Medicaid Other

## 2022-06-03 VITALS — BP 130/92 | HR 86 | Ht 66.0 in | Wt 190.0 lb

## 2022-06-03 DIAGNOSIS — M25512 Pain in left shoulder: Secondary | ICD-10-CM

## 2022-06-03 NOTE — Patient Instructions (Addendum)
Good to see you today.  You had a L shoulder injection.  Call or go to the ER if you develop a large red swollen joint with extreme pain or oozing puss.   Please get an Xray today before you leave.  Follow-up: one month

## 2022-06-04 ENCOUNTER — Telehealth: Payer: Self-pay | Admitting: Family Medicine

## 2022-06-04 DIAGNOSIS — M25512 Pain in left shoulder: Secondary | ICD-10-CM

## 2022-06-04 NOTE — Progress Notes (Signed)
Left shoulder x-ray looks a little unusual to radiology.  They see a minimal deformity of the humeral neck and are worried about a potential fracture that they cannot really see very well on this x-ray it would like me to get a CT scan to look at it better.  I have ordered the CT scan.  We should hear soon about scheduling.

## 2022-06-04 NOTE — Telephone Encounter (Signed)
CT scan left shoulder order

## 2022-06-04 NOTE — Progress Notes (Signed)
Patient given results per dr corey's request and informed about the CT scan that was ordered and that he is out of town next week and would like her to wear a sling. Patient stated understanding. Patient informed where she could find a sling and if she cannot find one we have them here and it is a 30 dollar charge.

## 2022-06-04 NOTE — Progress Notes (Signed)
Cervical spine x-ray shows some mild arthritis changes.

## 2022-06-14 ENCOUNTER — Ambulatory Visit
Admission: RE | Admit: 2022-06-14 | Discharge: 2022-06-14 | Disposition: A | Discharge status: Home / Self Care | Payer: Medicaid Other | Source: Ambulatory Visit | Attending: Family Medicine | Admitting: Family Medicine

## 2022-06-14 DIAGNOSIS — M25512 Pain in left shoulder: Secondary | ICD-10-CM

## 2022-06-17 NOTE — Progress Notes (Signed)
CT scan of the shoulder does not show a fracture or any significant abnormalities around the humeral neck where we saw her on x-ray.  It just looks funny on x-ray.  You do have arthritis in the shoulder joint.

## 2022-06-22 ENCOUNTER — Ambulatory Visit (INDEPENDENT_AMBULATORY_CARE_PROVIDER_SITE_OTHER): Payer: Medicaid Other | Admitting: Pulmonary Disease

## 2022-06-22 ENCOUNTER — Encounter: Payer: Self-pay | Admitting: Pulmonary Disease

## 2022-06-22 VITALS — BP 120/62 | HR 68 | Temp 98.3°F | Ht 65.5 in | Wt 189.8 lb

## 2022-06-22 DIAGNOSIS — R0602 Shortness of breath: Secondary | ICD-10-CM

## 2022-06-22 DIAGNOSIS — J849 Interstitial pulmonary disease, unspecified: Secondary | ICD-10-CM

## 2022-06-22 LAB — CBC WITH DIFFERENTIAL/PLATELET
Basophils Absolute: 0 10*3/uL (ref 0.0–0.1)
Basophils Relative: 0.5 % (ref 0.0–3.0)
Eosinophils Absolute: 0.1 10*3/uL (ref 0.0–0.7)
Eosinophils Relative: 1.1 % (ref 0.0–5.0)
HCT: 40.2 % (ref 36.0–46.0)
Hemoglobin: 13.5 g/dL (ref 12.0–15.0)
Lymphocytes Relative: 25.2 % (ref 12.0–46.0)
Lymphs Abs: 2.2 10*3/uL (ref 0.7–4.0)
MCHC: 33.6 g/dL (ref 30.0–36.0)
MCV: 83 fl (ref 78.0–100.0)
Monocytes Absolute: 0.7 10*3/uL (ref 0.1–1.0)
Monocytes Relative: 8.3 % (ref 3.0–12.0)
Neutro Abs: 5.8 10*3/uL (ref 1.4–7.7)
Neutrophils Relative %: 64.9 % (ref 43.0–77.0)
Platelets: 214 10*3/uL (ref 150.0–400.0)
RBC: 4.84 Mil/uL (ref 3.87–5.11)
RDW: 15.5 % (ref 11.5–15.5)
WBC: 8.9 10*3/uL (ref 4.0–10.5)

## 2022-06-22 MED ORDER — PREDNISONE 20 MG PO TABS: ORAL_TABLET | ORAL | 0 refills | Status: DC

## 2022-06-22 MED ORDER — BUDESONIDE-FORMOTEROL FUMARATE 160-4.5 MCG/ACT IN AERO: 2.0000 | INHALATION_SPRAY | Freq: Two times a day (BID) | RESPIRATORY_TRACT | 5 refills | Status: DC

## 2022-06-22 NOTE — Progress Notes (Signed)
Rowena Moilanen    [id number]    1971/01/02  Primary Care Physician:Muse, Noel Journey., PA-C  Referring Physician: Nicholas Lose, MD [address] [place],  [place] [address]  Chief complaint: Consult for abnormal CT   HPI: 51 y.o. who  has a past medical history of Asthma, Back pain, Breast cancer ([place]), Buttock pain, Family history of breast cancer (09/22/2020), Hypertension, Personal history of chemotherapy, and Personal history of radiation therapy.   Referred for evaluation of abnormal CT.  History notable for asthma which is not under control She has daily symptoms of dyspnea, wheezing, nocturnal awakenings 2-3 times every night.  She is currently on just albuterol inhaler Increase symptoms on exposure to grass mowing, heat and humidity.  Has history of seasonal allergies, denies GERD symptoms  Diagnosed with breast cancer in September 2021 which is treated with lumpectomy, paclitaxel and trastuzumab.  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  No feather pillows or comforters ILD exposure questionnaire 06/22/2022-negative Smoking history: 12 pack-year smoker.  Continues to smoke half pack per day Travel history: No significant travel history Relevant family history: No family history of lung disease  Outpatient Encounter Medications as of 06/22/2022  Medication Sig   albuterol (VENTOLIN HFA) 108 (90 Base) MCG/ACT inhaler Inhale 2 puffs into the lungs every 4 (four) hours as needed for wheezing or shortness of breath.   baclofen (LIORESAL) 10 MG tablet Take 1 tablet (10 mg total) by mouth at bedtime as needed for muscle spasms.   Cholecalciferol (VITAMIN D3) 125 MCG (5000 UT) TABS Take 1 tablet by mouth daily.   clonazePAM (KLONOPIN) 0.5 MG tablet Take 0.5 mg by mouth daily as needed.   escitalopram  (LEXAPRO) 10 MG tablet Take 10 mg by mouth daily.   gabapentin (NEURONTIN) 300 MG capsule Take 1 capsule (300 mg total) by mouth 3 (three) times daily.   HYDROcodone-acetaminophen (NORCO/VICODIN) 5-325 MG tablet Take 1 tablet by mouth every 6 (six) hours as needed.   letrozole (FEMARA) 2.5 MG tablet Take 2.5 mg by mouth daily.   lisinopril (ZESTRIL) 10 MG tablet Take 1 tablet (10 mg total) by mouth daily.   prazosin (MINIPRESS) 1 MG capsule Take 1 mg by mouth at bedtime.   No facility-administered encounter medications on file as of 06/22/2022.    Allergies as of 06/22/2022   (No Known Allergies)    Past Medical History:  Diagnosis Date   Asthma    Back pain    Breast cancer ([place])    Buttock pain    Family history of breast cancer 09/22/2020   Hypertension    Personal history of chemotherapy    Personal history of radiation therapy     Past Surgical History:  Procedure Laterality Date   ABDOMINAL HYSTERECTOMY     APPENDECTOMY     BREAST LUMPECTOMY WITH RADIOACTIVE SEED AND SENTINEL LYMPH NODE BIOPSY Right 09/10/2020   Procedure: RIGHT BREAST LUMPECTOMY WITH RADIOACTIVE SEED AND SENTINEL LYMPH NODE MAPPING;  Surgeon: Erroll Luna, MD;  Location: [facility];  Service: General;  Laterality: Right;   PORTACATH PLACEMENT Right 10/16/2020   Procedure: INSERTION PORT-A-CATH WITH ULTRASOUND GUIDANCE;  Surgeon: Erroll Luna, MD;  Location: [facility];  Service: General;  Laterality: Right;   RE-EXCISION OF BREAST LUMPECTOMY Right 10/16/2020  Procedure: RE-EXCISION OF RIGHT BREAST LUMPECTOMY;  Surgeon: Erroll Luna, MD;  Location: [facility];  Service: General;  Laterality: Right;    Family History  Problem Relation Age of Onset   Hypertension Father    Hypertension Sister    Breast cancer Maternal Aunt 76   Breast cancer Paternal Aunt        dx at unknown age   Hypertension Maternal Grandmother    Breast cancer Other         MGM's niece; dx 49s   Breast cancer Other        MGF's niece    Social History   Socioeconomic History   Marital status: Single    Spouse name: Not on file   Number of children: Not on file   Years of education: Not on file   Highest education level: Not on file  Occupational History   Not on file  Tobacco Use   Smoking status: Former    Packs/day: 0.50    Years: 35.00    Total pack years: 17.50    Types: Cigarettes    Quit date: 08/22/2020    Years since quitting: 1.8   Smokeless tobacco: Never  Vaping Use   Vaping Use: Never used  Substance and Sexual Activity   Alcohol use: No   Drug use: Never   Sexual activity: Not Currently    Birth control/protection: Surgical  Other Topics Concern   Not on file  Social History Narrative   Not on file   Social Determinants of Health   Financial Resource Strain: Not on file  Food Insecurity: Not on file  Transportation Needs: Not on file  Physical Activity: Not on file  Stress: Not on file  Social Connections: Not on file  Intimate Partner Violence: Not on file    Review of systems: Review of Systems  Constitutional: Negative for fever and chills.  HENT: Negative.   Eyes: Negative for blurred vision.  Respiratory: as per HPI  Cardiovascular: Negative for chest pain and palpitations.  Gastrointestinal: Negative for vomiting, diarrhea, blood per rectum. Genitourinary: Negative for dysuria, urgency, frequency and hematuria.  Musculoskeletal: Negative for myalgias, back pain and joint pain.  Skin: Negative for itching and rash.  Neurological: Negative for dizziness, tremors, focal weakness, seizures and loss of consciousness.  Endo/Heme/Allergies: Negative for environmental allergies.  Psychiatric/Behavioral: Negative for depression, suicidal ideas and hallucinations.  All other systems reviewed and are negative.  Physical Exam: Blood pressure 120/62, pulse 68, temperature 98.3 F (36.8 C), temperature source Oral,  height 5' 5.5" (1.664 m), weight 189 lb 12.8 oz (86.1 kg), SpO2 98 %. Gen:      No acute distress HEENT:  EOMI, sclera anicteric Neck:     No masses; no thyromegaly Lungs:    Clear to auscultation bilaterally; normal respiratory effort CV:         Regular rate and rhythm; no murmurs Abd:      + bowel sounds; soft, non-tender; no palpable masses, no distension Ext:    No edema; adequate peripheral perfusion Skin:      Warm and dry; no rash Neuro: alert and oriented x 3 Psych: normal mood and affect  Data Reviewed: Imaging: CT chest 02/15/2022-mild bronchial wall thickening with mosaic attenuation, scattered pulmonary nodules.  CT chest 05/21/2022-progressive mosaic attenuation, stable pulmonary nodules.  I have reviewed the images personally.  PFTs:  Labs:  Cardiac: Echocardiogram 10/06/2021-LVEF 60 to 65%.  Normal RV systolic function and size  Assessment:  Assessment for abnormal CT, concern for interstitial lung disease I reviewed his CT which shows mild groundglass with mosaic attenuation.  Findings are very nonspecific and may represent pulmonary vascular disease, obstructive airway disease or hypersensitivity, COP, smoking-related changes. She does not have significant exposures, no pulmonary hypertension on echocardiogram last year.    We will start off by treating her asthma adequately as it is uncontrolled at the moment, work on smoking cessation and get a high-res CT and PFTs for evaluation.  Active smoker Discussed smoking cessation.  She wants to try an order on.  Reassess at return visit Time spent counseling-5 minutes  Plan/Recommendations: Prednisone 40 mg a day for 7 days Start Symbicort Check CBC differential, IgE Schedule high-res CTs and PFTs  Marshell Garfinkel MD [facility] 06/22/2022, 9:57 AM  CC: Nicholas Lose, MD

## 2022-06-22 NOTE — Patient Instructions (Signed)
We will start you on prednisone 40 mg a day for 7 days Start Symbicort 160/4.5.  Use 2 puffs twice daily Check CBC differential, IgE Please continue to work on smoking cessation as it will help with inflammation  Schedule PFTs and high res CT scan in 1 to 2 months and return to clinic for review and plan for next steps

## 2022-06-23 LAB — IGE: IgE (Immunoglobulin E), Serum: 250 kU/L — ABNORMAL HIGH (ref <=114)

## 2022-07-01 NOTE — Progress Notes (Signed)
I, Peterson Lombard, LAT, ATC acting as a scribe for Lynne Leader, MD.  Monica [name] is a 51 y.o. female who presents to [place] at [facility] today for f/u of L shoulder pain. Pt reports L shoulder pain since 05/26/22 that began after lifting her 38# grandson. Pt was seen at the [facility] ED on 05/28/22 w/ this complaint.  She was last seen by Dr. Georgina Snell on 06/03/22 and was given a L subacromial steroid injection. Based on the unusual appearance of the humeral head in the XR, Dr. Georgina Snell ordered a L shoulder CT. Today, pt reports maybe slight improvement in her L shoulder pain, but is still severe. She is constantly wearing a sling throughout the day and not using the L arm at all.  Dx imaging: 06/14/22 L shoulder CT  06/03/22 L shoulder & c-spine XR  05/28/22 L shoulder XR  Pertinent review of systems: No fevers or chills  Relevant historical information: History of right-sided breast cancer.   Exam:  Ht 5' 5.5" (1.664 m)   Wt 188 lb 6.4 oz (85.5 kg)   BMI 30.87 kg/m  General: Well Developed, well nourished, and in no acute distress.   MSK: Left shoulder: Normal. Decreased range of motion with significant pain.    Lab and Radiology Results  Procedure: Real-time Ultrasound Guided Injection of left shoulder glenohumeral joint posterior approach Device: Philips Affiniti 50G Images permanently stored and available for review in PACS Verbal informed consent obtained.  Discussed risks and benefits of procedure. Warned about infection, bleeding, hyperglycemia damage to structures among others. Patient expresses understanding and agreement Time-out conducted.   Noted no overlying erythema, induration, or other signs of local infection.   Skin prepped in a sterile fashion.   Local anesthesia: Topical Ethyl chloride.   With sterile technique and under real time ultrasound guidance: 40 mg of Kenalog and 2 mL of Marcaine injected into glenohumeral joint. Fluid  seen entering the joint capsule.   Completed without difficulty   Pain moderately  resolved suggesting accurate placement of the medication.   Advised to call if fevers/chills, erythema, induration, drainage, or persistent bleeding.   Images permanently stored and available for review in the ultrasound unit.  Impression: Technically successful ultrasound guided injection.   EXAM: CT OF THE UPPER LEFT EXTREMITY WITHOUT CONTRAST   TECHNIQUE: Multidetector CT imaging of the upper left extremity was performed according to the standard protocol.   RADIATION DOSE REDUCTION: This exam was performed according to the departmental dose-optimization program which includes automated exposure control, adjustment of the mA and/or kV according to patient size and/or use of iterative reconstruction technique.   COMPARISON:  Plain films left shoulder 06/03/2022.   FINDINGS: Bones/Joint/Cartilage   There is no acute bony or joint abnormality. The patient has glenohumeral osteoarthritis with joint space narrowing, and osteophyte off the humeral head and some subchondral cyst formation in the glenoid. The acromioclavicular joint appears normal. The acromion is type 1 with a small subacromial spur.   Ligaments   Suboptimally assessed by CT.   Muscles and Tendons   As visualized by CT scan, the rotator cuff appears intact. No muscle atrophy is identified.   Soft tissues   Imaged lung parenchyma is clear.   IMPRESSION: No acute abnormality.  Specifically, negative for fracture.   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     Electronically Signed   By: Inge Rise M.D.   On: 06/16/2022 09:16 I, Lynne Leader, personally (independently) visualized and  performed the interpretation of the images attached in this note.       Assessment and Plan: 51 y.o. female with left shoulder pain.  Patient has severe left shoulder pain.  She does have significant DJD seen on CT scan  of her shoulder most likely explains her pain.  However she had only moderate pain relief with a steroid injection in clinic.  Hopefully she will have some more prolonged benefit once the steroid component starts working.  If not it may be rational for her to have a second opinion/surgical planning with orthopedic surgery to discuss total shoulder replacement.  Differential does include cervical radiculopathy but this is less likely.   PDMP not reviewed this encounter. Orders Placed This Encounter  Procedures   [place] LIMITED JOINT SPACE STRUCTURES UP LEFT(NO LINKED CHARGES)    Order Specific Question:   Reason for Exam (SYMPTOM  OR DIAGNOSIS REQUIRED)    Answer:   lt gh inj    Order Specific Question:   Preferred imaging location?    Answer:   [place]   No orders of the defined types were placed in this encounter.    Discussed warning signs or symptoms. Please see discharge instructions. Patient expresses understanding.   .[email or web address]

## 2022-07-05 ENCOUNTER — Ambulatory Visit: Payer: Self-pay

## 2022-07-05 ENCOUNTER — Ambulatory Visit (INDEPENDENT_AMBULATORY_CARE_PROVIDER_SITE_OTHER): Payer: Medicaid Other | Admitting: Family Medicine

## 2022-07-05 VITALS — Ht 65.5 in | Wt 188.4 lb

## 2022-07-05 DIAGNOSIS — M25512 Pain in left shoulder: Secondary | ICD-10-CM

## 2022-07-05 NOTE — Patient Instructions (Addendum)
Thank you for coming in today.   You received an injection today. Seek immediate medical attention if the joint becomes red, extremely painful, or is oozing fluid.    If this shot does not help enough let me know.

## 2022-07-13 ENCOUNTER — Telehealth: Payer: Self-pay | Admitting: Pulmonary Disease

## 2022-07-13 NOTE — Telephone Encounter (Signed)
Spoke to provide Ct information. Patient states lab work came back abnormal and would like results.

## 2022-07-14 NOTE — Telephone Encounter (Signed)
Called patient and she is wanting more information about her IGE lab results being elevated. Please advise.

## 2022-07-21 NOTE — Telephone Encounter (Signed)
ATC patient. Patient did not answer and I was unable to leave a voicemail due to her mailbox not being set up.   Will try again later.

## 2022-07-21 NOTE — Telephone Encounter (Signed)
Her IgE is mildly elevated.  We see this sometimes due to allergies and asthma.  Continue treatment as discussed in office visit with inhalers.

## 2022-07-21 NOTE — Telephone Encounter (Signed)
Sent mychart message to pt of the info from Dr. Vaughan Browner. Nothing further needed.

## 2022-07-31 ENCOUNTER — Emergency Department (HOSPITAL_COMMUNITY)
Admission: EM | Admit: 2022-07-31 | Discharge: 2022-07-31 | Disposition: A | Discharge status: Home / Self Care | Payer: Medicaid Other | Attending: Emergency Medicine | Admitting: Emergency Medicine

## 2022-07-31 ENCOUNTER — Encounter (HOSPITAL_COMMUNITY): Payer: Self-pay | Admitting: Emergency Medicine

## 2022-07-31 ENCOUNTER — Emergency Department (HOSPITAL_COMMUNITY): Payer: Medicaid Other

## 2022-07-31 DIAGNOSIS — Z79899 Other long term (current) drug therapy: Secondary | ICD-10-CM | POA: Diagnosis not present

## 2022-07-31 DIAGNOSIS — J181 Lobar pneumonia, unspecified organism: Secondary | ICD-10-CM | POA: Diagnosis not present

## 2022-07-31 DIAGNOSIS — E876 Hypokalemia: Secondary | ICD-10-CM | POA: Diagnosis not present

## 2022-07-31 DIAGNOSIS — J189 Pneumonia, unspecified organism: Secondary | ICD-10-CM

## 2022-07-31 DIAGNOSIS — R000 Tachycardia, unspecified: Secondary | ICD-10-CM | POA: Insufficient documentation

## 2022-07-31 DIAGNOSIS — Z7951 Long term (current) use of inhaled steroids: Secondary | ICD-10-CM | POA: Diagnosis not present

## 2022-07-31 DIAGNOSIS — I10 Essential (primary) hypertension: Secondary | ICD-10-CM | POA: Insufficient documentation

## 2022-07-31 DIAGNOSIS — J45909 Unspecified asthma, uncomplicated: Secondary | ICD-10-CM | POA: Insufficient documentation

## 2022-07-31 DIAGNOSIS — Z853 Personal history of malignant neoplasm of breast: Secondary | ICD-10-CM | POA: Diagnosis not present

## 2022-07-31 DIAGNOSIS — Z20822 Contact with and (suspected) exposure to covid-19: Secondary | ICD-10-CM | POA: Insufficient documentation

## 2022-07-31 DIAGNOSIS — R0602 Shortness of breath: Secondary | ICD-10-CM | POA: Diagnosis present

## 2022-07-31 LAB — CBC WITH DIFFERENTIAL/PLATELET
Abs Immature Granulocytes: 0.03 10*3/uL (ref 0.00–0.07)
Basophils Absolute: 0 10*3/uL (ref 0.0–0.1)
Basophils Relative: 0 %
Eosinophils Absolute: 0 10*3/uL (ref 0.0–0.5)
Eosinophils Relative: 0 %
HCT: 38 % (ref 36.0–46.0)
Hemoglobin: 13.4 g/dL (ref 12.0–15.0)
Immature Granulocytes: 0 %
Lymphocytes Relative: 15 %
Lymphs Abs: 1.5 10*3/uL (ref 0.7–4.0)
MCH: 28.7 pg (ref 26.0–34.0)
MCHC: 35.3 g/dL (ref 30.0–36.0)
MCV: 81.4 fL (ref 80.0–100.0)
Monocytes Absolute: 0.9 10*3/uL (ref 0.1–1.0)
Monocytes Relative: 9 %
Neutro Abs: 7.4 10*3/uL (ref 1.7–7.7)
Neutrophils Relative %: 76 %
Platelets: 210 10*3/uL (ref 150–400)
RBC: 4.67 MIL/uL (ref 3.87–5.11)
RDW: 15 % (ref 11.5–15.5)
WBC: 9.9 10*3/uL (ref 4.0–10.5)
nRBC: 0 % (ref 0.0–0.2)

## 2022-07-31 LAB — BASIC METABOLIC PANEL
Anion gap: 9 (ref 5–15)
BUN: 9 mg/dL (ref 6–20)
CO2: 23 mmol/L (ref 22–32)
Calcium: 9.2 mg/dL (ref 8.9–10.3)
Chloride: 107 mmol/L (ref 98–111)
Creatinine, Ser: 0.85 mg/dL (ref 0.44–1.00)
GFR, Estimated: >60 mL/min (ref >60)
Glucose, Bld: 128 mg/dL — ABNORMAL HIGH (ref 70–99)
Potassium: 2.9 mmol/L — ABNORMAL LOW (ref 3.5–5.1)
Sodium: 139 mmol/L (ref 135–145)

## 2022-07-31 LAB — RESP PANEL BY RT-PCR (FLU A&B, COVID) ARPGX2
Influenza A by PCR: NEGATIVE
Influenza B by PCR: NEGATIVE
SARS Coronavirus 2 by RT PCR: NEGATIVE

## 2022-07-31 MED ORDER — ALBUTEROL SULFATE HFA 108 (90 BASE) MCG/ACT IN AERS
2.0000 | INHALATION_SPRAY | Freq: Once | RESPIRATORY_TRACT | Status: AC
  Administered 2022-07-31: 2 via RESPIRATORY_TRACT
  Filled 2022-07-31: qty 6.7

## 2022-07-31 MED ORDER — IPRATROPIUM-ALBUTEROL 0.5-2.5 (3) MG/3ML IN SOLN
3.0000 mL | Freq: Once | RESPIRATORY_TRACT | Status: AC
  Administered 2022-07-31: 3 mL via RESPIRATORY_TRACT
  Filled 2022-07-31: qty 3

## 2022-07-31 MED ORDER — AZITHROMYCIN 250 MG PO TABS: 250.0000 mg | ORAL_TABLET | Freq: Every day | ORAL | 0 refills | Status: DC

## 2022-07-31 MED ORDER — IPRATROPIUM-ALBUTEROL 0.5-2.5 (3) MG/3ML IN SOLN
3.0000 mL | Freq: Once | RESPIRATORY_TRACT | Status: AC
Start: 2022-07-31 — End: 2022-07-31
  Administered 2022-07-31: 3 mL via RESPIRATORY_TRACT
  Filled 2022-07-31: qty 3

## 2022-07-31 MED ORDER — POTASSIUM CHLORIDE CRYS ER 20 MEQ PO TBCR: 40.0000 meq | EXTENDED_RELEASE_TABLET | Freq: Two times a day (BID) | ORAL | 1 refills | Status: DC

## 2022-07-31 MED ORDER — AMOXICILLIN-POT CLAVULANATE 875-125 MG PO TABS: 1.0000 | ORAL_TABLET | Freq: Two times a day (BID) | ORAL | 0 refills | Status: DC

## 2022-07-31 MED ORDER — POTASSIUM CHLORIDE CRYS ER 20 MEQ PO TBCR
40.0000 meq | EXTENDED_RELEASE_TABLET | Freq: Once | ORAL | Status: AC
  Administered 2022-07-31: 40 meq via ORAL
  Filled 2022-07-31: qty 2

## 2022-07-31 NOTE — ED Triage Notes (Signed)
Pt to the ED with complaints of nasal congestion for the past week.   Pt has a history of CA with the last treatment being 9 months ago.

## 2022-07-31 NOTE — Discharge Instructions (Addendum)
You were seen today in the emergency department for shortness of breath and cough.  Chest x-ray shows a pneumonia, take Augmentin twice daily for 7 days.  Take azithromycin twice the first day, after that take once daily for 4 days.  You will be taking Augmentin and azithromycin at the same time.  Additionally, take 40 mg of potassium twice daily for this week.  This will help replenish her potassium supplementation.  If you develop shortness of breath, fevers, feeling worse return back to the emergency department for reevaluation. Follow-up with your main doctor Monday or Tuesday for reevaluation.

## 2022-07-31 NOTE — ED Provider Notes (Signed)
[facility] EMERGENCY DEPARTMENT Provider Note   CSN: [id number] Arrival date & time: 07/31/22  1329     History  Chief Complaint  Patient presents with   Shortness of Breath    Monica [name] is a 51 y.o. female.   Shortness of Breath    Patient with personal history of breast cancer currently in remission status postradiation chemotherapy 9 months ago, asthma, hypertension presents today due to 5 days nasal congestion, shortness of breath and productive cough with green sputum.  States it started after being around her grandson who was sick with similar symptoms, his mother also had similar symptoms.  She has been using DuoNebs and albuterol with minimal help, she cannot take anything over-the-counter per oncology recommendations.  Advised to go to ED for evaluation.  Not having any fevers, denies chest pain, lower extremity swelling, dizziness, lightheadedness.  Patient is not anticoagulated.  Home Medications Prior to Admission medications   Medication Sig Start Date End Date Taking? Authorizing Provider  amoxicillin-clavulanate (AUGMENTIN) 875-125 MG tablet Take 1 tablet by mouth every 12 (twelve) hours. 07/31/22  Yes Sherrill Raring, PA-C  azithromycin (ZITHROMAX) 250 MG tablet Take 1 tablet (250 mg total) by mouth daily. Take first 2 tablets together, then 1 every day until finished. 07/31/22  Yes Sherrill Raring, PA-C  potassium chloride SA (KLOR-CON M) 20 MEQ tablet Take 2 tablets (40 mEq total) by mouth 2 (two) times daily. 07/31/22  Yes Sherrill Raring, PA-C  albuterol (VENTOLIN HFA) 108 (90 Base) MCG/ACT inhaler Inhale 2 puffs into the lungs every 4 (four) hours as needed for wheezing or shortness of breath. 10/05/20   Wyvonnia Dusky, MD  baclofen (LIORESAL) 10 MG tablet Take 1 tablet (10 mg total) by mouth at bedtime as needed for muscle spasms. 11/10/21   Gregor Hams, MD  budesonide-formoterol [facility]) 160-4.5 MCG/ACT inhaler Inhale 2 puffs into the lungs in the morning  and at bedtime. 06/22/22   Mannam, Hart Robinsons, MD  Cholecalciferol (VITAMIN D3) 125 MCG (5000 UT) TABS Take 1 tablet by mouth daily. 05/14/20   [provider]  clonazePAM (KLONOPIN) 0.5 MG tablet Take 0.5 mg by mouth daily as needed. 12/10/21   [provider]  escitalopram (LEXAPRO) 10 MG tablet Take 10 mg by mouth daily. 11/13/21   [provider]  gabapentin (NEURONTIN) 300 MG capsule Take 1 capsule (300 mg total) by mouth 3 (three) times daily. 12/11/21   Gregor Hams, MD  HYDROcodone-acetaminophen (NORCO/VICODIN) 5-325 MG tablet Take 1 tablet by mouth every 6 (six) hours as needed. 01/11/22   Gregor Hams, MD  letrozole [facility]) 2.5 MG tablet Take 2.5 mg by mouth daily.    [provider]  lisinopril (ZESTRIL) 10 MG tablet Take 1 tablet (10 mg total) by mouth daily. 11/10/20   Nicholas Lose, MD  prazosin (MINIPRESS) 1 MG capsule Take 1 mg by mouth at bedtime.    [provider]  predniSONE (DELTASONE) 20 MG tablet Take '40mg'$  for 7 days 06/22/22   Marshell Garfinkel, MD      Allergies    Patient has no known allergies.    Review of Systems   Review of Systems  Respiratory:  Positive for shortness of breath.     Physical Exam Updated Vital Signs BP 109/70   Pulse 100   Temp 98 F (36.7 C) (Oral)   Resp (!) 23   Ht 5' 5.5" (1.664 m)   Wt 85.5 kg   SpO2 97%  BMI 30.89 kg/m  Physical Exam Vitals and nursing note reviewed. Exam conducted with a chaperone present.  Constitutional:      Appearance: Normal appearance.  HENT:     Head: Normocephalic and atraumatic.     Nose: Congestion present.  Eyes:     General: No scleral icterus.       Right eye: No discharge.        Left eye: No discharge.     Extraocular Movements: Extraocular movements intact.     Pupils: Pupils are equal, round, and reactive to light.  Neck:     Vascular: No JVD.  Cardiovascular:     Rate and Rhythm: Regular rhythm. Tachycardia present.     Pulses: Normal pulses.      Heart sounds: Normal heart sounds. No murmur heard.    No friction rub. No gallop.  Pulmonary:     Effort: Pulmonary effort is normal. No respiratory distress.     Breath sounds: Examination of the right-upper field reveals wheezing. Examination of the left-upper field reveals wheezing. Examination of the right-middle field reveals wheezing. Examination of the left-middle field reveals wheezing. Examination of the right-lower field reveals wheezing. Examination of the left-lower field reveals wheezing. Wheezing present.  Abdominal:     General: Abdomen is flat. Bowel sounds are normal. There is no distension.     Palpations: Abdomen is soft.     Tenderness: There is no abdominal tenderness.  Musculoskeletal:     Right lower leg: No edema.     Left lower leg: No edema.  Skin:    General: Skin is warm and dry.     Coloration: Skin is not jaundiced.  Neurological:     Mental Status: She is alert. Mental status is at baseline.     Coordination: Coordination normal.     ED Results / Procedures / Treatments   Labs (all labs ordered are listed, but only abnormal results are displayed) Labs Reviewed  BASIC METABOLIC PANEL - Abnormal; Notable for the following components:      Result Value   Potassium 2.9 (*)    Glucose, Bld 128 (*)    All other components within normal limits  RESP PANEL BY RT-PCR (FLU A&B, COVID) [id number]  CBC WITH DIFFERENTIAL/PLATELET    EKG None  Radiology DG Chest Port 1 View  Result Date: 07/31/2022 CLINICAL DATA:  Nasal congestion for a week.  Shortness of breath. EXAM: PORTABLE CHEST 1 VIEW COMPARISON:  July 14, 2021 FINDINGS: Focal opacity in the right perihilar region. The heart, hila, mediastinum, lungs, and pleura are otherwise unremarkable. IMPRESSION: Focal opacity in the right mid lung is new in the interval. This may represent focal pneumonia. If the patient has findings of infection, recommend treatment with short-term follow-up x-ray imaging in 2 or 3  weeks to ensure resolution. If the patient does not have signs of infection, recommend a CT scan for better evaluation, especially given the history malignancy. Electronically Signed   By: Dorise Bullion III M.D.   On: 07/31/2022 14:23    Procedures Procedures    Medications Ordered in ED Medications  ipratropium-albuterol (DUONEB) 0.5-2.5 (3) MG/3ML nebulizer solution 3 mL (3 mLs Nebulization Given 07/31/22 1408)  albuterol (VENTOLIN HFA) 108 (90 Base) MCG/ACT inhaler 2 puff (2 puffs Inhalation Given 07/31/22 1408)  potassium chloride SA (KLOR-CON M) CR tablet 40 mEq (40 mEq Oral Given 07/31/22 1531)  ipratropium-albuterol (DUONEB) 0.5-2.5 (3) MG/3ML nebulizer solution 3 mL (3 mLs Nebulization Given 07/31/22 1531)  ED Course/ Medical Decision Making/ A&P                           Medical Decision Making Amount and/or Complexity of Data Reviewed Labs: ordered. Radiology: ordered.  Risk Prescription drug management.   Patient presents due to cough, viral symptoms.  Differential includes but not limited to COVID, viral URI, pneumonia, PE, asthma exacerbation.  On exam patient mildly tachycardic but not hypoxic.  Lungs with diffuse wheezing, no tachypnea or respiratory distress.  -BP 109/70   Pulse 100   Temp 98 F (36.7 C) (Oral)   Resp (!) 23   Ht 5' 5.5" (1.664 m)   Wt 85.5 kg   SpO2 97%   BMI 30.89 kg/m    I ordered and reviewed laboratory work-up.  CBC without leukocytosis or anemia.  BMP with some mild hypokalemia potassium 2.9.  No AKI.  Patient is COVID and flu negative.  Chest x-ray notable for right midlung pneumonia.  Consistent with history and exam.  I ordered DuoNeb x2, albuterol and [place] for the patient.    On reevaluation her lung sounds have improved and she is no longer wheezing.    Considered admission but no leukocytosis, fever or hypoxia.  The patient is appropriate for outpatient trial of antibiotics with strict precautions.  We will start patient on  potassium supplementation, Augmentin and azithromycin.  She will follow-up with her primary next week for reevaluation.        Final Clinical Impression(s) / ED Diagnoses Final diagnoses:  Community acquired pneumonia of right lower lobe of lung  Hypokalemia    Rx / DC Orders ED Discharge Orders          Ordered    amoxicillin-clavulanate (AUGMENTIN) 875-125 MG tablet  Every 12 hours        07/31/22 1502    potassium chloride SA (KLOR-CON M) 20 MEQ tablet  2 times daily        07/31/22 1502    azithromycin (ZITHROMAX) 250 MG tablet  Daily        07/31/22 1502              Sherrill Raring, PA-C 16/01/09 [id number]    Campbell Stall P, DO 57/32/20 1623

## 2022-07-31 NOTE — ED Notes (Signed)
I was called by lab to note respiratory panel results. They described that they are unable to physically add RSV into the chart, but that pt was positive for RSV on the respiratory panel.

## 2022-08-11 ENCOUNTER — Ambulatory Visit (HOSPITAL_COMMUNITY)
Admission: RE | Admit: 2022-08-11 | Discharge: 2022-08-11 | Disposition: A | Discharge status: Home / Self Care | Payer: Medicaid Other | Source: Ambulatory Visit | Attending: Pulmonary Disease | Admitting: Pulmonary Disease

## 2022-08-11 ENCOUNTER — Other Ambulatory Visit: Payer: Self-pay | Admitting: *Deleted

## 2022-08-11 DIAGNOSIS — J849 Interstitial pulmonary disease, unspecified: Secondary | ICD-10-CM | POA: Diagnosis present

## 2022-08-11 DIAGNOSIS — Z1231 Encounter for screening mammogram for malignant neoplasm of breast: Secondary | ICD-10-CM

## 2022-08-11 DIAGNOSIS — R0602 Shortness of breath: Secondary | ICD-10-CM | POA: Diagnosis not present

## 2022-08-17 ENCOUNTER — Other Ambulatory Visit (HOSPITAL_COMMUNITY): Payer: Self-pay | Admitting: Physician Assistant

## 2022-08-17 ENCOUNTER — Ambulatory Visit (HOSPITAL_COMMUNITY)
Admission: RE | Admit: 2022-08-17 | Discharge: 2022-08-17 | Disposition: A | Discharge status: Home / Self Care | Payer: Medicaid Other | Source: Ambulatory Visit | Attending: Physician Assistant | Admitting: Physician Assistant

## 2022-08-17 DIAGNOSIS — J189 Pneumonia, unspecified organism: Secondary | ICD-10-CM

## 2022-08-20 ENCOUNTER — Ambulatory Visit (INDEPENDENT_AMBULATORY_CARE_PROVIDER_SITE_OTHER): Payer: Medicaid Other | Admitting: Pulmonary Disease

## 2022-08-20 ENCOUNTER — Encounter: Payer: Self-pay | Admitting: Pulmonary Disease

## 2022-08-20 VITALS — BP 116/82 | HR 76 | Temp 98.3°F | Ht 65.5 in | Wt 192.0 lb

## 2022-08-20 DIAGNOSIS — R0602 Shortness of breath: Secondary | ICD-10-CM

## 2022-08-20 DIAGNOSIS — R918 Other nonspecific abnormal finding of lung field: Secondary | ICD-10-CM | POA: Diagnosis not present

## 2022-08-20 DIAGNOSIS — J849 Interstitial pulmonary disease, unspecified: Secondary | ICD-10-CM | POA: Diagnosis not present

## 2022-08-20 DIAGNOSIS — J4540 Moderate persistent asthma, uncomplicated: Secondary | ICD-10-CM | POA: Diagnosis not present

## 2022-08-20 LAB — PULMONARY FUNCTION TEST
RV % pred: 76 %
RV: 1.44 L
TLC % pred: 70 %
TLC: 3.71 L

## 2022-08-20 NOTE — Patient Instructions (Signed)
I am glad you are stable with your breathing Continue the inhalers Have reviewed his CT scan which shows some small lung nodules which will need follow-up. We will order a follow-up CT chest without contrast in 6 months Follow-up in clinic in 6 months

## 2022-08-20 NOTE — Progress Notes (Signed)
Monica [name]    [id number]    [birth year]  Primary Care Physician:Muse, Noel Journey., PA-C  Referring Physician: Raiford Simmonds., PA-C [address] [place],   [id number]  Chief complaint: Consult for abnormal CT   HPI: 51 y.o. who  has a past medical history of Asthma, Back pain, Breast cancer ([place]), Buttock pain, Family history of breast cancer (09/22/2020), Hypertension, Personal history of chemotherapy, and Personal history of radiation therapy.   Referred for evaluation of abnormal CT.  History notable for asthma which is not under control She has daily symptoms of dyspnea, wheezing, nocturnal awakenings 2-3 times every night.  She is currently on just albuterol inhaler Increase symptoms on exposure to grass mowing, heat and humidity.  Has history of seasonal allergies, denies GERD symptoms  Diagnosed with breast cancer in September 2021 which is treated with lumpectomy, paclitaxel and trastuzumab.  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  No feather pillows or comforters ILD exposure questionnaire 06/22/2022-negative Smoking history: 12 pack-year smoker.  Continues to smoke half pack per day Travel history: No significant travel history Relevant family history: No family history of lung disease  Outpatient Encounter Medications as of 08/20/2022  Medication Sig   albuterol (VENTOLIN HFA) 108 (90 Base) MCG/ACT inhaler Inhale 2 puffs into the lungs every 4 (four) hours as needed for wheezing or shortness of breath.   baclofen (LIORESAL) 10 MG tablet Take 1 tablet (10 mg total) by mouth at bedtime as needed for muscle spasms.   budesonide-formoterol (SYMBICORT) 160-4.5 MCG/ACT inhaler Inhale 2 puffs into the lungs in the morning and at bedtime.   Cholecalciferol (VITAMIN D3) 125 MCG (5000 UT) TABS Take 1  tablet by mouth daily.   clonazePAM (KLONOPIN) 0.5 MG tablet Take 0.5 mg by mouth daily as needed.   escitalopram (LEXAPRO) 10 MG tablet Take 10 mg by mouth daily.   gabapentin (NEURONTIN) 300 MG capsule Take 1 capsule (300 mg total) by mouth 3 (three) times daily.   HYDROcodone-acetaminophen (NORCO/VICODIN) 5-325 MG tablet Take 1 tablet by mouth every 6 (six) hours as needed.   letrozole (FEMARA) 2.5 MG tablet Take 2.5 mg by mouth daily.   lisinopril (ZESTRIL) 10 MG tablet Take 1 tablet (10 mg total) by mouth daily.   prazosin (MINIPRESS) 1 MG capsule Take 1 mg by mouth at bedtime.   predniSONE (DELTASONE) 20 MG tablet Take '40mg'$  for 7 days   amoxicillin-clavulanate (AUGMENTIN) 875-125 MG tablet Take 1 tablet by mouth every 12 (twelve) hours. (Patient not taking: Reported on 08/20/2022)   azithromycin (ZITHROMAX) 250 MG tablet Take 1 tablet (250 mg total) by mouth daily. Take first 2 tablets together, then 1 every day until finished. (Patient not taking: Reported on 08/20/2022)   potassium chloride SA (KLOR-CON M) 20 MEQ tablet Take 2 tablets (40 mEq total) by mouth 2 (two) times daily. (Patient not taking: Reported on 08/20/2022)   No facility-administered encounter medications on file as of 08/20/2022.    Allergies as of 08/20/2022   (No Known Allergies)    Past Medical History:  Diagnosis Date   Asthma    Back pain    Breast cancer ([place])    Buttock pain    Family history of breast cancer 09/22/2020   Hypertension    Personal  history of chemotherapy    Personal history of radiation therapy     Past Surgical History:  Procedure Laterality Date   ABDOMINAL HYSTERECTOMY     APPENDECTOMY     BREAST LUMPECTOMY WITH RADIOACTIVE SEED AND SENTINEL LYMPH NODE BIOPSY Right 09/10/2020   Procedure: RIGHT BREAST LUMPECTOMY WITH RADIOACTIVE SEED AND SENTINEL LYMPH NODE MAPPING;  Surgeon: Erroll Luna, MD;  Location: [place];  Service: General;  Laterality: Right;   PORTACATH  PLACEMENT Right 10/16/2020   Procedure: INSERTION PORT-A-CATH WITH ULTRASOUND GUIDANCE;  Surgeon: Erroll Luna, MD;  Location: [place];  Service: General;  Laterality: Right;   RE-EXCISION OF BREAST LUMPECTOMY Right 10/16/2020   Procedure: RE-EXCISION OF RIGHT BREAST LUMPECTOMY;  Surgeon: Erroll Luna, MD;  Location: [facility];  Service: General;  Laterality: Right;    Family History  Problem Relation Age of Onset   Hypertension Father    Hypertension Sister    Breast cancer Maternal Aunt 38   Breast cancer Paternal Aunt        dx at unknown age   Hypertension Maternal Grandmother    Breast cancer Other        MGM's niece; dx 39s   Breast cancer Other        MGF's niece    Social History   Socioeconomic History   Marital status: Single    Spouse name: Not on file   Number of children: Not on file   Years of education: Not on file   Highest education level: Not on file  Occupational History   Not on file  Tobacco Use   Smoking status: Former    Packs/day: 0.50    Years: 35.00    Total pack years: 17.50    Types: Cigarettes    Quit date: 08/22/2020    Years since quitting: 1.9   Smokeless tobacco: Never  Vaping Use   Vaping Use: Never used  Substance and Sexual Activity   Alcohol use: No   Drug use: Never   Sexual activity: Not Currently    Birth control/protection: Surgical  Other Topics Concern   Not on file  Social History Narrative   Not on file   Social Determinants of Health   Financial Resource Strain: Not on file  Food Insecurity: Not on file  Transportation Needs: Not on file  Physical Activity: Not on file  Stress: Not on file  Social Connections: Not on file  Intimate Partner Violence: Not on file    Review of systems: Review of Systems  Constitutional: Negative for fever and chills.  HENT: Negative.   Eyes: Negative for blurred vision.  Respiratory: as per HPI  Cardiovascular: Negative for chest pain  and palpitations.  Gastrointestinal: Negative for vomiting, diarrhea, blood per rectum. Genitourinary: Negative for dysuria, urgency, frequency and hematuria.  Musculoskeletal: Negative for myalgias, back pain and joint pain.  Skin: Negative for itching and rash.  Neurological: Negative for dizziness, tremors, focal weakness, seizures and loss of consciousness.  Endo/Heme/Allergies: Negative for environmental allergies.  Psychiatric/Behavioral: Negative for depression, suicidal ideas and hallucinations.  All other systems reviewed and are negative.  Physical Exam: Blood pressure 120/62, pulse 68, temperature 98.3 F (36.8 C), temperature source Oral, height 5' 5.5" (1.664 m), weight 189 lb 12.8 oz (86.1 kg), SpO2 98 %. Gen:      No acute distress HEENT:  EOMI, sclera anicteric Neck:     No masses; no thyromegaly Lungs:    Clear to  auscultation bilaterally; normal respiratory effort CV:         Regular rate and rhythm; no murmurs Abd:      + bowel sounds; soft, non-tender; no palpable masses, no distension Ext:    No edema; adequate peripheral perfusion Skin:      Warm and dry; no rash Neuro: alert and oriented x 3 Psych: normal mood and affect  Data Reviewed: Imaging: CT chest 02/15/2022-mild bronchial wall thickening with mosaic attenuation, scattered pulmonary nodules.  CT chest 05/21/2022-progressive mosaic attenuation, stable pulmonary nodules.    High-resolution CT 08/11/2022-numerous small bilateral pulmonary nodules which are stable.  Air trapping.  Mild bronchiectasis. I have reviewed the images personally.  PFTs: 08/20/2022 FVC 2.27 (61%], TLC 3.71 [70%] Mild restriction Unable to complete spirometry and diffusion capacity.  Labs: CBC 06/19/2022 WBC 8.9, eosinophils 1.1%, absolute eosinophil count 98 IgE 06/22/2022-250  Cardiac: Echocardiogram 10/06/2021-LVEF 60 to 65%.  Normal RV systolic function and size  Assessment:  Assessment for abnormal CT, concern for  interstitial lung disease Moderate persistent asthma I reviewed his CT which shows mild groundglass with mosaic attenuation.  Findings are very nonspecific and may represent pulmonary vascular disease, obstructive airway disease or hypersensitivity, COP, smoking-related changes. She does not have significant exposures, no pulmonary hypertension on echocardiogram last year.  There is a read of Corcoran syndrome but that is a diagnosis of exclusion.  There is no evidence of fibrotic interstitial lung disease  Continue Symbicort inhaler  Active smoker Discussed smoking cessation.  Plan/Recommendations: Continue Symbicort Follow-up CT Return to clinic in 6 months  Marshell Garfinkel MD [facility] 08/20/2022, 10:33 AM  CC: Raiford Simmonds., PA-C  Thank you

## 2022-08-20 NOTE — Patient Instructions (Signed)
Lung volumes completed today.

## 2022-08-20 NOTE — Progress Notes (Signed)
Patient had difficulty performing test. She was only able to complete lung volumes.

## 2022-08-24 ENCOUNTER — Telehealth: Payer: Self-pay | Admitting: Family Medicine

## 2022-08-24 MED ORDER — HYDROCODONE-ACETAMINOPHEN 5-325 MG PO TABS
1.0000 | ORAL_TABLET | Freq: Four times a day (QID) | ORAL | 0 refills | Status: DC | PRN
Start: 2022-08-24 — End: 2022-10-05

## 2022-08-24 NOTE — Telephone Encounter (Signed)
Hydrocodone refilled.  This is only can work temporarily.  Recommend return to clinic next week.  I think she probably is going to need a total shoulder replacement and orthopedic surgery referral or consultation is probably her best bet.

## 2022-08-24 NOTE — Telephone Encounter (Signed)
Pt would like a refill of pain meds (hydrocodone). She is struggling with L sided pain at night and the gabapentin is not providing enough relief.

## 2022-08-24 NOTE — Telephone Encounter (Signed)
Called and relayed this information to pt and scheduled her for a f/u visit for 9/27.

## 2022-08-24 NOTE — Telephone Encounter (Signed)
Patient called back to follow up on this request. Please advise.

## 2022-08-31 ENCOUNTER — Ambulatory Visit: Payer: Medicaid Other

## 2022-09-01 ENCOUNTER — Ambulatory Visit (INDEPENDENT_AMBULATORY_CARE_PROVIDER_SITE_OTHER): Payer: Medicaid Other | Admitting: Family Medicine

## 2022-09-01 VITALS — Ht 65.5 in | Wt 194.0 lb

## 2022-09-01 DIAGNOSIS — M19012 Primary osteoarthritis, left shoulder: Secondary | ICD-10-CM

## 2022-09-01 DIAGNOSIS — M25512 Pain in left shoulder: Secondary | ICD-10-CM

## 2022-09-01 NOTE — Patient Instructions (Addendum)
Thank you for coming in today.   I've referred you to Rocco Pauls at [facility] for a surgical consultation.  Let us know if you don't hear from them in one week.   Check back as needed

## 2022-09-01 NOTE — Progress Notes (Signed)
I, Peterson Lombard, LAT, ATC acting as a scribe for Lynne Leader, MD.  Monica [name] is a 51 y.o. female who presents to [place] at [facility] today for f/u L shoulder pain. Pt reports L shoulder pain since 05/26/22 that began after lifting her 38# grandson. Pt was seen at the [facility] ED on 05/28/22 w/ this complaint. Pt was last seen by Dr. Georgina Snell on 07/05/22 and was given a L GH steroid injection. Pt called the office on 08/24/22 requesting a refill of hydrocodone. Today, pt reports L shoulder pain is about the same. Pt has been keeping her arm in the sling.  She rates her pain as severe requiring gabapentin and hydrocodone at times for pain control.  She feels generally overwhelmed by her medical needs.  She is dealing with lung disease that she thinks is probably a consequence of her chemotherapy for breast cancer.  Additionally she is having back pain.  Dx imaging: 06/14/22 L shoulder CT             06/03/22 L shoulder & c-spine XR         05/28/22 L shoulder XR  Pertinent review of systems: No fevers or chills  Relevant historical information: Lung disease, breast cancer history.   Exam:  Ht 5' 5.5" (1.664 m)   Wt 194 lb (88 kg)   BMI 31.79 kg/m  General: Well Developed, well nourished, and in no acute distress.   MSK: Left shoulder: Wearing a sling.  Decreased shoulder range of motion.    Lab and Radiology Results EXAM: CT OF THE UPPER LEFT EXTREMITY WITHOUT CONTRAST   TECHNIQUE: Multidetector CT imaging of the upper left extremity was performed according to the standard protocol.   RADIATION DOSE REDUCTION: This exam was performed according to the departmental dose-optimization program which includes automated exposure control, adjustment of the mA and/or kV according to patient size and/or use of iterative reconstruction technique.   COMPARISON:  Plain films left shoulder 06/03/2022.   FINDINGS: Bones/Joint/Cartilage   There is no  acute bony or joint abnormality. The patient has glenohumeral osteoarthritis with joint space narrowing, and osteophyte off the humeral head and some subchondral cyst formation in the glenoid. The acromioclavicular joint appears normal. The acromion is type 1 with a small subacromial spur.   Ligaments   Suboptimally assessed by CT.   Muscles and Tendons   As visualized by CT scan, the rotator cuff appears intact. No muscle atrophy is identified.   Soft tissues   Imaged lung parenchyma is clear.   IMPRESSION: No acute abnormality.  Specifically, negative for fracture.   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     Electronically Signed   By: Inge Rise M.D.   On: 06/16/2022 09:16 I, Lynne Leader, personally (independently) visualized and performed the interpretation of the images attached in this note.      Assessment and Plan: 51 y.o. female with left shoulder pain.  Pain is thought predominantly due to glenohumeral DJD.  She had a glenohumeral injection a month ago with not much long-term benefit.  She has significant pain and functional disability with her shoulder.  She no longer is using her arm and has a confined to a sling on her own.  I am not optimistic about further conservative management and I think it is reasonable for her to have a surgical consultation and a surgical second opinion about her options.  I think is likely that she will require a total  shoulder or reverse total shoulder replacement.   PDMP reviewed during this encounter. Orders Placed This Encounter  Procedures   Ambulatory referral to Orthopedic Surgery    Referral Priority:   Routine    Referral Type:   Surgical    Referral Reason:   Specialty Services Required    Referred to Provider:   Vanetta Mulders, MD    Requested Specialty:   Orthopedic Surgery    Number of Visits Requested:   1   No orders of the defined types were placed in this encounter.    Discussed warning  signs or symptoms. Please see discharge instructions. Patient expresses understanding.   The above documentation has been reviewed and is accurate and complete Lynne Leader, M.D.

## 2022-09-08 ENCOUNTER — Ambulatory Visit (INDEPENDENT_AMBULATORY_CARE_PROVIDER_SITE_OTHER): Payer: Medicaid Other | Admitting: Orthopaedic Surgery

## 2022-09-08 DIAGNOSIS — M19012 Primary osteoarthritis, left shoulder: Secondary | ICD-10-CM

## 2022-09-08 NOTE — Progress Notes (Signed)
Chief Complaint: Left shoulder pain     History of Present Illness:    Monica [name] is a 51 y.o. female presents today with ongoing left shoulder pain as a referral from Dr. Georgina Snell for left shoulder osteoarthritis.  She does have a history of pain over the course the last year.  She has very limited activity of the left shoulder with any type of overhead motion.  She is not able to lift anything into cabinets overhead.  She does have a grandson which she has a very difficult time lifting.  She is experiencing pain in most activities.  She was given a glenohumeral injection with Dr. Georgina Snell which she states she got approximately several weeks of relief from.  She is taking hydrocodone for pain.  She is right-hand dominant.  She was recently diagnosed with breast cancer for which she is in remission.    Surgical History:   None  PMH/PSH/Family History/Social History/Meds/Allergies:    Past Medical History:  Diagnosis Date   Asthma    Back pain    Breast cancer ([place])    Buttock pain    Family history of breast cancer 09/22/2020   Hypertension    Personal history of chemotherapy    Personal history of radiation therapy    Past Surgical History:  Procedure Laterality Date   ABDOMINAL HYSTERECTOMY     APPENDECTOMY     BREAST LUMPECTOMY WITH RADIOACTIVE SEED AND SENTINEL LYMPH NODE BIOPSY Right 09/10/2020   Procedure: RIGHT BREAST LUMPECTOMY WITH RADIOACTIVE SEED AND SENTINEL LYMPH NODE MAPPING;  Surgeon: Erroll Luna, MD;  Location: [place];  Service: General;  Laterality: Right;   PORTACATH PLACEMENT Right 10/16/2020   Procedure: INSERTION PORT-A-CATH WITH ULTRASOUND GUIDANCE;  Surgeon: Erroll Luna, MD;  Location: [place];  Service: General;  Laterality: Right;   RE-EXCISION OF BREAST LUMPECTOMY Right 10/16/2020   Procedure: RE-EXCISION OF RIGHT BREAST LUMPECTOMY;  Surgeon: Erroll Luna, MD;   Location: [place];  Service: General;  Laterality: Right;   Social History   Socioeconomic History   Marital status: Single    Spouse name: Not on file   Number of children: Not on file   Years of education: Not on file   Highest education level: Not on file  Occupational History   Not on file  Tobacco Use   Smoking status: Former    Packs/day: 0.50    Years: 35.00    Total pack years: 17.50    Types: Cigarettes    Quit date: 08/22/2020    Years since quitting: 2.0   Smokeless tobacco: Never  Vaping Use   Vaping Use: Never used  Substance and Sexual Activity   Alcohol use: No   Drug use: Never   Sexual activity: Not Currently    Birth control/protection: Surgical  Other Topics Concern   Not on file  Social History Narrative   Not on file   Social Determinants of Health   Financial Resource Strain: Not on file  Food Insecurity: Not on file  Transportation Needs: Not on file  Physical Activity: Not on file  Stress: Not on file  Social Connections: Not on file   Family History  Problem Relation Age of Onset   Hypertension Father    Hypertension Sister  Breast cancer Maternal Aunt 33   Breast cancer Paternal Aunt        dx at unknown age   Hypertension Maternal Grandmother    Breast cancer Other        MGM's niece; dx 15s   Breast cancer Other        MGF's niece   No Known Allergies Current Outpatient Medications  Medication Sig Dispense Refill   albuterol (VENTOLIN HFA) 108 (90 Base) MCG/ACT inhaler Inhale 2 puffs into the lungs every 4 (four) hours as needed for wheezing or shortness of breath. 8 g 3   baclofen (LIORESAL) 10 MG tablet Take 1 tablet (10 mg total) by mouth at bedtime as needed for muscle spasms. 90 each 1   budesonide-formoterol (SYMBICORT) 160-4.5 MCG/ACT inhaler Inhale 2 puffs into the lungs in the morning and at bedtime. 10.2 g 5   Cholecalciferol (VITAMIN D3) 125 MCG (5000 UT) TABS Take 1 tablet by mouth daily.      clonazePAM (KLONOPIN) 0.5 MG tablet Take 0.5 mg by mouth daily as needed.     escitalopram (LEXAPRO) 10 MG tablet Take 10 mg by mouth daily.     gabapentin (NEURONTIN) 300 MG capsule Take 1 capsule (300 mg total) by mouth 3 (three) times daily. 90 capsule 2   HYDROcodone-acetaminophen (NORCO/VICODIN) 5-325 MG tablet Take 1 tablet by mouth every 6 (six) hours as needed. 15 tablet 0   letrozole (FEMARA) 2.5 MG tablet Take 2.5 mg by mouth daily.     lisinopril (ZESTRIL) 10 MG tablet Take 1 tablet (10 mg total) by mouth daily.     potassium chloride SA (KLOR-CON M) 20 MEQ tablet Take 2 tablets (40 mEq total) by mouth 2 (two) times daily. (Patient not taking: Reported on 08/20/2022) 14 tablet 1   prazosin (MINIPRESS) 1 MG capsule Take 1 mg by mouth at bedtime.     No current facility-administered medications for this visit.   No results found.  Review of Systems:   A ROS was performed including pertinent positives and negatives as documented in the HPI.  Physical Exam :   Constitutional: NAD and appears stated age Neurological: Alert and oriented Psych: Appropriate affect and cooperative There were no vitals taken for this visit.   Comprehensive Musculoskeletal Exam:    Musculoskeletal Exam    Inspection Right Left  Skin No atrophy or winging No atrophy or winging  Palpation    Tenderness none Glenohumeral  Range of Motion    Flexion (passive) 170 100  Flexion (active) 170 90  Abduction 170 90  ER at the side 70 30  Can reach behind back to T12 Side  Strength     full Full with pain  Special Tests    Pseudoparalytic No No  Neurologic    Fires PIN, radial, median, ulnar, musculocutaneous, axillary, suprascapular, long thoracic, and spinal accessory innervated muscles. No abnormal sensibility  Vascular/Lymphatic    Radial Pulse 2+ 2+  Cervical Exam    Patient has symmetric cervical range of motion with negative Spurling's test.  Special Test:      Imaging:   Xray (3 views  left shoulder): Moderate glenohumeral osteoarthritis  CT left shoulder: There is significant cyst formation involving the glenoid with posterior glenoid bone wear.  I personally reviewed and interpreted the radiographs.   Assessment:   51 y.o. female right-hand-dominant with left shoulder moderate osteoarthritis which has been recalcitrant to injections at this point.  I did describe that I typically do not  recommend physical therapy for shoulder osteoarthritis as this can create more pain and wearing on the joint. We discussed additional treatment options.  At this point injections are no longer helpful for her.  To that effect we did discuss surgical intervention.  Specifically for her I do believe that a reverse shoulder arthroplasty would likely give her the best outcome specifically given her significant glenoid bone cyst.  At this time she is quite overwhelmed about her other medical problems.  I did discuss that this is an outpatient procedure.  We did discuss the nature of the surgery specifically.  She would like some time to consider this and will contact us on MyChart should she wish to pursue  Plan :    -She will contact us on MyChart should she wish to proceed with shoulder arthroplasty     I personally saw and evaluated the patient, and participated in the management and treatment plan.  Vanetta Mulders, MD Attending Physician, Orthopedic Surgery  This document was dictated using Dragon voice recognition software. A reasonable attempt at proof reading has been made to minimize errors.

## 2022-09-24 ENCOUNTER — Telehealth: Payer: Self-pay | Admitting: Hematology and Oncology

## 2022-09-28 ENCOUNTER — Ambulatory Visit
Admission: RE | Admit: 2022-09-28 | Discharge: 2022-09-28 | Disposition: A | Discharge status: Home / Self Care | Payer: Medicaid Other | Source: Ambulatory Visit | Attending: *Deleted | Admitting: *Deleted

## 2022-09-28 ENCOUNTER — Other Ambulatory Visit: Payer: Self-pay | Admitting: *Deleted

## 2022-09-28 DIAGNOSIS — Z1231 Encounter for screening mammogram for malignant neoplasm of breast: Secondary | ICD-10-CM

## 2022-09-28 DIAGNOSIS — R928 Other abnormal and inconclusive findings on diagnostic imaging of breast: Secondary | ICD-10-CM

## 2022-09-28 DIAGNOSIS — Z853 Personal history of malignant neoplasm of breast: Secondary | ICD-10-CM

## 2022-10-01 ENCOUNTER — Ambulatory Visit
Admission: RE | Admit: 2022-10-01 | Discharge: 2022-10-01 | Disposition: A | Discharge status: Home / Self Care | Payer: Medicaid Other | Source: Ambulatory Visit | Attending: *Deleted | Admitting: *Deleted

## 2022-10-01 DIAGNOSIS — Z853 Personal history of malignant neoplasm of breast: Secondary | ICD-10-CM

## 2022-10-04 ENCOUNTER — Telehealth: Payer: Self-pay | Admitting: Family Medicine

## 2022-10-04 NOTE — Telephone Encounter (Signed)
Patient called asking if Dr Georgina Snell would be willing to refill her HYDROcodone-acetaminophen (NORCO/VICODIN) 5-325 MG tablet to [facility] in [place].  She said that she has decided to continue with surgery and is waiting for a call back to schedule but is in a lot of pain and would like a refill on her pain medicine.

## 2022-10-05 MED ORDER — HYDROCODONE-ACETAMINOPHEN 5-325 MG PO TABS
1.0000 | ORAL_TABLET | Freq: Four times a day (QID) | ORAL | 0 refills | Status: DC | PRN
Start: 2022-10-05 — End: 2022-12-22

## 2022-10-05 NOTE — Telephone Encounter (Signed)
Medicine refilled. 

## 2022-10-11 NOTE — Telephone Encounter (Signed)
Pt called in stating that she have been reaching out to Dr. Sammuel Hines office... Pt stated that no one have return her call... Pt stated that she is ready to schedule her surgery... Pt requesting callback

## 2022-10-11 NOTE — Telephone Encounter (Signed)
Spoke to patient and submitted surgery sheet to [facility]. I informed the patient she will need general and oncology clearance and those offices may call her for their clearance procedure. I also told the patient she will need to come in for a pre op appt with Dr. Sammuel Hines once surgery is scheduled to receive the post op sling and medications.

## 2022-10-18 ENCOUNTER — Telehealth: Payer: Self-pay | Admitting: Orthopaedic Surgery

## 2022-10-18 NOTE — Telephone Encounter (Signed)
Patient wants to get the FLU and COVID vaccine this week, but wants to make sure it is okay to do so before surgery. Per patient, she has to get both in her left arm. She cannot have anything in her right. Surgery is scheduled for 11/16/22. Please advise patient.

## 2022-10-18 NOTE — Telephone Encounter (Signed)
After verbal discussion with Dr. Sammuel Hines, [facility] to patient informing her Dr. Sammuel Hines did not see a problem with getting the vaccines this week as surgery is 12/12

## 2022-10-20 ENCOUNTER — Other Ambulatory Visit (HOSPITAL_BASED_OUTPATIENT_CLINIC_OR_DEPARTMENT_OTHER): Payer: Self-pay | Admitting: Orthopaedic Surgery

## 2022-10-20 DIAGNOSIS — M19012 Primary osteoarthritis, left shoulder: Secondary | ICD-10-CM

## 2022-10-25 ENCOUNTER — Other Ambulatory Visit (HOSPITAL_BASED_OUTPATIENT_CLINIC_OR_DEPARTMENT_OTHER): Payer: Self-pay | Admitting: Orthopaedic Surgery

## 2022-10-25 ENCOUNTER — Other Ambulatory Visit: Payer: Self-pay | Admitting: Hematology and Oncology

## 2022-10-25 DIAGNOSIS — M19012 Primary osteoarthritis, left shoulder: Secondary | ICD-10-CM

## 2022-10-26 ENCOUNTER — Telehealth: Payer: Self-pay | Admitting: Hematology and Oncology

## 2022-10-26 NOTE — Telephone Encounter (Signed)
Called patient per 11/21 in basket. Patient notified of upcoming appointment.

## 2022-11-08 ENCOUNTER — Ambulatory Visit (HOSPITAL_BASED_OUTPATIENT_CLINIC_OR_DEPARTMENT_OTHER): Payer: Self-pay | Admitting: Orthopaedic Surgery

## 2022-11-08 ENCOUNTER — Other Ambulatory Visit (HOSPITAL_BASED_OUTPATIENT_CLINIC_OR_DEPARTMENT_OTHER): Payer: Self-pay

## 2022-11-08 ENCOUNTER — Encounter: Payer: Self-pay | Admitting: Hematology and Oncology

## 2022-11-08 ENCOUNTER — Ambulatory Visit (INDEPENDENT_AMBULATORY_CARE_PROVIDER_SITE_OTHER): Payer: Medicaid Other | Admitting: Orthopaedic Surgery

## 2022-11-08 DIAGNOSIS — M19012 Primary osteoarthritis, left shoulder: Secondary | ICD-10-CM

## 2022-11-08 MED ORDER — ASPIRIN 325 MG PO TBEC
325.0000 mg | DELAYED_RELEASE_TABLET | Freq: Every day | ORAL | 0 refills | Status: DC
  Filled 2022-11-08: qty 30, 30d supply, fill #0

## 2022-11-08 MED ORDER — OXYCODONE HCL 5 MG PO TABS
5.0000 mg | ORAL_TABLET | ORAL | 0 refills | Status: DC | PRN
  Filled 2022-11-08: qty 20, 4d supply, fill #0

## 2022-11-08 MED ORDER — ACETAMINOPHEN 500 MG PO TABS
500.0000 mg | ORAL_TABLET | Freq: Three times a day (TID) | ORAL | 0 refills | Status: AC
  Filled 2022-11-08: qty 30, 10d supply, fill #0

## 2022-11-08 MED ORDER — IBUPROFEN 800 MG PO TABS
800.0000 mg | ORAL_TABLET | Freq: Three times a day (TID) | ORAL | 0 refills | Status: AC
Start: 2022-11-08 — End: 2022-11-18
  Filled 2022-11-08: qty 30, 10d supply, fill #0

## 2022-11-08 NOTE — Progress Notes (Signed)
Chief Complaint: Left shoulder pain     History of Present Illness:   11/08/2022: Presents today for follow-up of her left shoulder.  At this time she is having significant pain with most activities of daily living.  She is not able to lay directly on the side.  She is in much more jovial spirits today and is looking forward to intervening on the left shoulder.  She is here today with her mom.  Monica [name] is a 51 y.o. female presents today with ongoing left shoulder pain as a referral from Dr. Georgina Snell for left shoulder osteoarthritis.  She does have a history of pain over the course the last year.  She has very limited activity of the left shoulder with any type of overhead motion.  She is not able to lift anything into cabinets overhead.  She does have a grandson which she has a very difficult time lifting.  She is experiencing pain in most activities.  She was given a glenohumeral injection with Dr. Georgina Snell which she states she got approximately several weeks of relief from.  She is taking hydrocodone for pain.  She is right-hand dominant.  She was recently diagnosed with breast cancer for which she is in remission.    Surgical History:   None  PMH/PSH/Family History/Social History/Meds/Allergies:    Past Medical History:  Diagnosis Date   Asthma    Back pain    Breast cancer ([place])    Buttock pain    Family history of breast cancer 09/22/2020   Hypertension    Personal history of chemotherapy    Personal history of radiation therapy    Past Surgical History:  Procedure Laterality Date   ABDOMINAL HYSTERECTOMY     APPENDECTOMY     BREAST LUMPECTOMY WITH RADIOACTIVE SEED AND SENTINEL LYMPH NODE BIOPSY Right 09/10/2020   Procedure: RIGHT BREAST LUMPECTOMY WITH RADIOACTIVE SEED AND SENTINEL LYMPH NODE MAPPING;  Surgeon: Erroll Luna, MD;  Location: [facility];  Service: General;  Laterality: Right;   PORTACATH PLACEMENT  Right 10/16/2020   Procedure: INSERTION PORT-A-CATH WITH ULTRASOUND GUIDANCE;  Surgeon: Erroll Luna, MD;  Location: [facility] [facility];  Service: General;  Laterality: Right;   RE-EXCISION OF BREAST LUMPECTOMY Right 10/16/2020   Procedure: RE-EXCISION OF RIGHT BREAST LUMPECTOMY;  Surgeon: Erroll Luna, MD;  Location: [place];  Service: General;  Laterality: Right;   Social History   Socioeconomic History   Marital status: Single    Spouse name: Not on file   Number of children: Not on file   Years of education: Not on file   Highest education level: Not on file  Occupational History   Not on file  Tobacco Use   Smoking status: Former    Packs/day: 0.50    Years: 35.00    Total pack years: 17.50    Types: Cigarettes    Quit date: 08/22/2020    Years since quitting: 2.2   Smokeless tobacco: Never  Vaping Use   Vaping Use: Never used  Substance and Sexual Activity   Alcohol use: No   Drug use: Never   Sexual activity: Not Currently    Birth control/protection: Surgical  Other Topics Concern   Not on file  Social History Narrative   Not on file   Social  Determinants of Health   Financial Resource Strain: Not on file  Food Insecurity: Not on file  Transportation Needs: Not on file  Physical Activity: Not on file  Stress: Not on file  Social Connections: Not on file   Family History  Problem Relation Age of Onset   Hypertension Father    Hypertension Sister    Breast cancer Maternal Aunt 16   Breast cancer Paternal Aunt        dx at unknown age   Hypertension Maternal Grandmother    Breast cancer Other        MGM's niece; dx 60s   Breast cancer Other        MGF's niece   No Known Allergies Current Outpatient Medications  Medication Sig Dispense Refill   acetaminophen (TYLENOL) 500 MG tablet Take 1 tablet (500 mg total) by mouth every 8 (eight) hours for 10 days. 30 tablet 0   aspirin EC 325 MG tablet Take 1 tablet (325 mg total)  by mouth daily. 30 tablet 0   ibuprofen (ADVIL) 800 MG tablet Take 1 tablet (800 mg total) by mouth every 8 (eight) hours for 10 days. Please take with food, please alternate with acetaminophen 30 tablet 0   oxycodone (OXY-IR) 5 MG capsule Take 1 capsule (5 mg total) by mouth every 4 (four) hours as needed (severe pain). 20 capsule 0   albuterol (VENTOLIN HFA) 108 (90 Base) MCG/ACT inhaler Inhale 2 puffs into the lungs every 4 (four) hours as needed for wheezing or shortness of breath. 8 g 3   baclofen (LIORESAL) 10 MG tablet Take 1 tablet (10 mg total) by mouth at bedtime as needed for muscle spasms. 90 each 1   budesonide-formoterol (SYMBICORT) 160-4.5 MCG/ACT inhaler Inhale 2 puffs into the lungs in the morning and at bedtime. 10.2 g 5   Cholecalciferol (VITAMIN D3) 125 MCG (5000 UT) TABS Take 1 tablet by mouth daily.     clonazePAM (KLONOPIN) 0.5 MG tablet Take 0.5 mg by mouth daily as needed.     escitalopram (LEXAPRO) 10 MG tablet Take 10 mg by mouth daily.     gabapentin (NEURONTIN) 300 MG capsule Take 1 capsule (300 mg total) by mouth 3 (three) times daily. 90 capsule 2   HYDROcodone-acetaminophen (NORCO/VICODIN) 5-325 MG tablet Take 1 tablet by mouth every 6 (six) hours as needed. 15 tablet 0   letrozole (FEMARA) 2.5 MG tablet TAKE 1 TABLET ONCE DAILY. 30 tablet 0   lisinopril (ZESTRIL) 10 MG tablet Take 1 tablet (10 mg total) by mouth daily.     potassium chloride SA (KLOR-CON M) 20 MEQ tablet Take 2 tablets (40 mEq total) by mouth 2 (two) times daily. (Patient not taking: Reported on 08/20/2022) 14 tablet 1   prazosin (MINIPRESS) 1 MG capsule Take 1 mg by mouth at bedtime.     No current facility-administered medications for this visit.   No results found.  Review of Systems:   A ROS was performed including pertinent positives and negatives as documented in the HPI.  Physical Exam :   Constitutional: NAD and appears stated age Neurological: Alert and oriented Psych: Appropriate  affect and cooperative There were no vitals taken for this visit.   Comprehensive Musculoskeletal Exam:    Musculoskeletal Exam    Inspection Right Left  Skin No atrophy or winging No atrophy or winging  Palpation    Tenderness none Glenohumeral  Range of Motion    Flexion (passive) 170 100  Flexion (active)  170 90  Abduction 170 90  ER at the side 70 30  Can reach behind back to T12 Side  Strength     full Full with pain  Special Tests    Pseudoparalytic No No  Neurologic    Fires PIN, radial, median, ulnar, musculocutaneous, axillary, suprascapular, long thoracic, and spinal accessory innervated muscles. No abnormal sensibility  Vascular/Lymphatic    Radial Pulse 2+ 2+  Cervical Exam    Patient has symmetric cervical range of motion with negative Spurling's test.  Special Test:      Imaging:   Xray (3 views left shoulder): Moderate glenohumeral osteoarthritis  CT left shoulder: There is significant cyst formation involving the glenoid with posterior glenoid bone wear.  I personally reviewed and interpreted the radiographs.   Assessment:   51 y.o. female right-hand-dominant with left shoulder moderate osteoarthritis which has been recalcitrant to injections at this point.  I did describe that I typically do not recommend physical therapy for shoulder osteoarthritis as this can create more pain and wearing on the joint. We discussed additional treatment options.  At this point injections are no longer helpful for her.  To that effect we did discuss surgical intervention.  Specifically for her I do believe that a reverse shoulder arthroplasty would likely give her the best outcome specifically given her significant glenoid bone cyst.  At this time her attitude is much more high spirited regarding intervening on the left shoulder.  At this time I do believe it is reasonable to proceed with left reverse shoulder arthroplasty after long discussion of the risks and benefits Plan  :    -Plan for left reverse shoulder arthroplasty   After a lengthy discussion of treatment options, including risks, benefits, alternatives, complications of surgical and nonsurgical conservative options, the patient elected surgical repair.   The patient  is aware of the material risks  and complications including, but not limited to injury to adjacent structures, neurovascular injury, infection, numbness, bleeding, implant failure, thermal burns, stiffness, persistent pain, failure to heal, disease transmission from allograft, need for further surgery, dislocation, anesthetic risks, blood clots, risks of death,and others. The probabilities of surgical success and failure discussed with patient given their particular co-morbidities.The time and nature of expected rehabilitation and recovery was discussed.The patient's questions were all answered preoperatively.  No barriers to understanding were noted. I explained the natural history of the disease process and Rx rationale.  I explained to the patient what I considered to be reasonable expectations given their personal situation.  The final treatment plan was arrived at through a shared patient decision making process model.      I personally saw and evaluated the patient, and participated in the management and treatment plan.  Vanetta Mulders, MD Attending Physician, Orthopedic Surgery  This document was dictated using Dragon voice recognition software. A reasonable attempt at proof reading has been made to minimize errors.

## 2022-11-08 NOTE — H&P (View-Only) (Signed)
Chief Complaint: Left shoulder pain     History of Present Illness:   11/08/2022: Presents today for follow-up of her left shoulder.  At this time she is having significant pain with most activities of daily living.  She is not able to lay directly on the side.  She is in much more jovial spirits today and is looking forward to intervening on the left shoulder.  She is here today with her mom.  Monica [name] is a 51 y.o. female presents today with ongoing left shoulder pain as a referral from Dr. Georgina Snell for left shoulder osteoarthritis.  She does have a history of pain over the course the last year.  She has very limited activity of the left shoulder with any type of overhead motion.  She is not able to lift anything into cabinets overhead.  She does have a grandson which she has a very difficult time lifting.  She is experiencing pain in most activities.  She was given a glenohumeral injection with Dr. Georgina Snell which she states she got approximately several weeks of relief from.  She is taking hydrocodone for pain.  She is right-hand dominant.  She was recently diagnosed with breast cancer for which she is in remission.    Surgical History:   None  PMH/PSH/Family History/Social History/Meds/Allergies:    Past Medical History:  Diagnosis Date   Asthma    Back pain    Breast cancer ([place])    Buttock pain    Family history of breast cancer 09/22/2020   Hypertension    Personal history of chemotherapy    Personal history of radiation therapy    Past Surgical History:  Procedure Laterality Date   ABDOMINAL HYSTERECTOMY     APPENDECTOMY     BREAST LUMPECTOMY WITH RADIOACTIVE SEED AND SENTINEL LYMPH NODE BIOPSY Right 09/10/2020   Procedure: RIGHT BREAST LUMPECTOMY WITH RADIOACTIVE SEED AND SENTINEL LYMPH NODE MAPPING;  Surgeon: Erroll Luna, MD;  Location: [facility];  Service: General;  Laterality: Right;   PORTACATH PLACEMENT  Right 10/16/2020   Procedure: INSERTION PORT-A-CATH WITH ULTRASOUND GUIDANCE;  Surgeon: Erroll Luna, MD;  Location: [facility];  Service: General;  Laterality: Right;   RE-EXCISION OF BREAST LUMPECTOMY Right 10/16/2020   Procedure: RE-EXCISION OF RIGHT BREAST LUMPECTOMY;  Surgeon: Erroll Luna, MD;  Location: [facility];  Service: General;  Laterality: Right;   Social History   Socioeconomic History   Marital status: Single    Spouse name: Not on file   Number of children: Not on file   Years of education: Not on file   Highest education level: Not on file  Occupational History   Not on file  Tobacco Use   Smoking status: Former    Packs/day: 0.50    Years: 35.00    Total pack years: 17.50    Types: Cigarettes    Quit date: 08/22/2020    Years since quitting: 2.2   Smokeless tobacco: Never  Vaping Use   Vaping Use: Never used  Substance and Sexual Activity   Alcohol use: No   Drug use: Never   Sexual activity: Not Currently    Birth control/protection: Surgical  Other Topics Concern   Not on file  Social History Narrative   Not on file   Social  Determinants of Health   Financial Resource Strain: Not on file  Food Insecurity: Not on file  Transportation Needs: Not on file  Physical Activity: Not on file  Stress: Not on file  Social Connections: Not on file   Family History  Problem Relation Age of Onset   Hypertension Father    Hypertension Sister    Breast cancer Maternal Aunt 36   Breast cancer Paternal Aunt        dx at unknown age   Hypertension Maternal Grandmother    Breast cancer Other        MGM's niece; dx 36s   Breast cancer Other        MGF's niece   No Known Allergies Current Outpatient Medications  Medication Sig Dispense Refill   acetaminophen (TYLENOL) 500 MG tablet Take 1 tablet (500 mg total) by mouth every 8 (eight) hours for 10 days. 30 tablet 0   aspirin EC 325 MG tablet Take 1 tablet (325 mg total)  by mouth daily. 30 tablet 0   ibuprofen (ADVIL) 800 MG tablet Take 1 tablet (800 mg total) by mouth every 8 (eight) hours for 10 days. Please take with food, please alternate with acetaminophen 30 tablet 0   oxycodone (OXY-IR) 5 MG capsule Take 1 capsule (5 mg total) by mouth every 4 (four) hours as needed (severe pain). 20 capsule 0   albuterol (VENTOLIN HFA) 108 (90 Base) MCG/ACT inhaler Inhale 2 puffs into the lungs every 4 (four) hours as needed for wheezing or shortness of breath. 8 g 3   baclofen (LIORESAL) 10 MG tablet Take 1 tablet (10 mg total) by mouth at bedtime as needed for muscle spasms. 90 each 1   budesonide-formoterol (SYMBICORT) 160-4.5 MCG/ACT inhaler Inhale 2 puffs into the lungs in the morning and at bedtime. 10.2 g 5   Cholecalciferol (VITAMIN D3) 125 MCG (5000 UT) TABS Take 1 tablet by mouth daily.     clonazePAM (KLONOPIN) 0.5 MG tablet Take 0.5 mg by mouth daily as needed.     escitalopram (LEXAPRO) 10 MG tablet Take 10 mg by mouth daily.     gabapentin (NEURONTIN) 300 MG capsule Take 1 capsule (300 mg total) by mouth 3 (three) times daily. 90 capsule 2   HYDROcodone-acetaminophen (NORCO/VICODIN) 5-325 MG tablet Take 1 tablet by mouth every 6 (six) hours as needed. 15 tablet 0   letrozole (FEMARA) 2.5 MG tablet TAKE 1 TABLET ONCE DAILY. 30 tablet 0   lisinopril (ZESTRIL) 10 MG tablet Take 1 tablet (10 mg total) by mouth daily.     potassium chloride SA (KLOR-CON M) 20 MEQ tablet Take 2 tablets (40 mEq total) by mouth 2 (two) times daily. (Patient not taking: Reported on 08/20/2022) 14 tablet 1   prazosin (MINIPRESS) 1 MG capsule Take 1 mg by mouth at bedtime.     No current facility-administered medications for this visit.   No results found.  Review of Systems:   A ROS was performed including pertinent positives and negatives as documented in the HPI.  Physical Exam :   Constitutional: NAD and appears stated age Neurological: Alert and oriented Psych: Appropriate  affect and cooperative There were no vitals taken for this visit.   Comprehensive Musculoskeletal Exam:    Musculoskeletal Exam    Inspection Right Left  Skin No atrophy or winging No atrophy or winging  Palpation    Tenderness none Glenohumeral  Range of Motion    Flexion (passive) 170 100  Flexion (active)  170 90  Abduction 170 90  ER at the side 70 30  Can reach behind back to T12 Side  Strength     full Full with pain  Special Tests    Pseudoparalytic No No  Neurologic    Fires PIN, radial, median, ulnar, musculocutaneous, axillary, suprascapular, long thoracic, and spinal accessory innervated muscles. No abnormal sensibility  Vascular/Lymphatic    Radial Pulse 2+ 2+  Cervical Exam    Patient has symmetric cervical range of motion with negative Spurling's test.  Special Test:      Imaging:   Xray (3 views left shoulder): Moderate glenohumeral osteoarthritis  CT left shoulder: There is significant cyst formation involving the glenoid with posterior glenoid bone wear.  I personally reviewed and interpreted the radiographs.   Assessment:   51 y.o. female right-hand-dominant with left shoulder moderate osteoarthritis which has been recalcitrant to injections at this point.  I did describe that I typically do not recommend physical therapy for shoulder osteoarthritis as this can create more pain and wearing on the joint. We discussed additional treatment options.  At this point injections are no longer helpful for her.  To that effect we did discuss surgical intervention.  Specifically for her I do believe that a reverse shoulder arthroplasty would likely give her the best outcome specifically given her significant glenoid bone cyst.  At this time her attitude is much more high spirited regarding intervening on the left shoulder.  At this time I do believe it is reasonable to proceed with left reverse shoulder arthroplasty after long discussion of the risks and benefits Plan  :    -Plan for left reverse shoulder arthroplasty   After a lengthy discussion of treatment options, including risks, benefits, alternatives, complications of surgical and nonsurgical conservative options, the patient elected surgical repair.   The patient  is aware of the material risks  and complications including, but not limited to injury to adjacent structures, neurovascular injury, infection, numbness, bleeding, implant failure, thermal burns, stiffness, persistent pain, failure to heal, disease transmission from allograft, need for further surgery, dislocation, anesthetic risks, blood clots, risks of death,and others. The probabilities of surgical success and failure discussed with patient given their particular co-morbidities.The time and nature of expected rehabilitation and recovery was discussed.The patient's questions were all answered preoperatively.  No barriers to understanding were noted. I explained the natural history of the disease process and Rx rationale.  I explained to the patient what I considered to be reasonable expectations given their personal situation.  The final treatment plan was arrived at through a shared patient decision making process model.      I personally saw and evaluated the patient, and participated in the management and treatment plan.  Vanetta Mulders, MD Attending Physician, Orthopedic Surgery  This document was dictated using Dragon voice recognition software. A reasonable attempt at proof reading has been made to minimize errors.

## 2022-11-10 ENCOUNTER — Encounter (HOSPITAL_BASED_OUTPATIENT_CLINIC_OR_DEPARTMENT_OTHER): Payer: Self-pay | Admitting: Orthopaedic Surgery

## 2022-11-10 NOTE — Progress Notes (Signed)
   11/10/22 8250  PAT Phone Screen  Is the patient taking a GLP-1 receptor agonist? No  Do You Have Diabetes? No  Do You Have Hypertension? Yes  Have You Ever Been to the ER for Asthma? No  Have You Taken Oral Steroids in the Past 3 Months? (S)  Yes (just finished 6 day course for cough)  Do you Take Phenteramine or any Other Diet Drugs? No  Recent  Lab Work, EKG, CXR? (S)  Yes  Where was this test performed? [facility] 07/31/22  Do you have a history of heart problems? No  Any Recent Hospitalizations? (S)  Yes (07/31/22 ED for pneumonia)  Height '5\' 5"'$  (1.651 m)  Weight 79.4 kg  Pat Appointment Scheduled (S)  Yes (PCR+ERAS/BPO)   Reviewed pulmonary notes and hx with Dr. Kalman Shan. Okay to proceed

## 2022-11-11 ENCOUNTER — Telehealth: Payer: Self-pay | Admitting: Orthopaedic Surgery

## 2022-11-11 ENCOUNTER — Encounter (HOSPITAL_BASED_OUTPATIENT_CLINIC_OR_DEPARTMENT_OTHER)
Admission: RE | Admit: 2022-11-11 | Discharge: 2022-11-11 | Disposition: A | Discharge status: Home / Self Care | Payer: Medicaid Other | Source: Ambulatory Visit | Attending: Orthopaedic Surgery | Admitting: Orthopaedic Surgery

## 2022-11-11 DIAGNOSIS — Z01812 Encounter for preprocedural laboratory examination: Secondary | ICD-10-CM | POA: Insufficient documentation

## 2022-11-11 LAB — SURGICAL PCR SCREEN
MRSA, PCR: NEGATIVE
Staphylococcus aureus: NEGATIVE

## 2022-11-11 NOTE — Telephone Encounter (Signed)
Patient calling in reference to after surgery care. Patient's mother will be with her day of surgery and that night. Patient is concerned because she will not have anyone to help her the following days until her daughter gets home from school at 3:30 pm. Patient is wondering if she can get someone to come in to help her during the day while she is alone? Please call to advise.

## 2022-11-11 NOTE — Progress Notes (Signed)
Surgical soap given with instructions, pt verbalized understanding.Enhanced Recovery after Surgery  Enhanced Recovery after Surgery is a protocol used to improve the stress on your body and your recovery after surgery.  Patient Instructions  The night before surgery:  No food after midnight. ONLY clear liquids after midnight  The day of surgery (if you do NOT have diabetes):  Drink ONE (1) Pre-Surgery Clear Ensure as directed.   This drink was given to you during your hospital  pre-op appointment visit. The pre-op nurse will instruct you on the time to drink the  Pre-Surgery Ensure depending on your surgery time. Finish the drink at the designated time by the pre-op nurse.  Nothing else to drink after completing the  Pre-Surgery Clear Ensure.  The day of surgery (if you have diabetes): Drink ONE (1) Gatorade 2 (G2) as directed. This drink was given to you during your hospital  pre-op appointment visit.  The pre-op nurse will instruct you on the time to drink the   Gatorade 2 (G2) depending on your surgery time. Color of the Gatorade may vary. Red is not allowed. Nothing else to drink after completing the  Gatorade 2 (G2).         If office.you have questions, please contact your surgeon's office Benzoyl peroxide gel given with instructions, pt verbalized understanding.

## 2022-11-12 NOTE — Telephone Encounter (Signed)
Patient's surgery is scheduled for 11/16/22.

## 2022-11-16 ENCOUNTER — Ambulatory Visit (HOSPITAL_BASED_OUTPATIENT_CLINIC_OR_DEPARTMENT_OTHER): Payer: Medicaid Other | Admitting: Anesthesiology

## 2022-11-16 ENCOUNTER — Encounter (HOSPITAL_BASED_OUTPATIENT_CLINIC_OR_DEPARTMENT_OTHER)
Admission: RE | Disposition: A | Discharge status: Home / Self Care | Payer: Self-pay | Source: Home / Self Care | Attending: Orthopaedic Surgery

## 2022-11-16 ENCOUNTER — Telehealth: Payer: Self-pay | Admitting: Orthopaedic Surgery

## 2022-11-16 ENCOUNTER — Encounter (HOSPITAL_BASED_OUTPATIENT_CLINIC_OR_DEPARTMENT_OTHER): Payer: Self-pay | Admitting: Orthopaedic Surgery

## 2022-11-16 ENCOUNTER — Other Ambulatory Visit: Payer: Self-pay

## 2022-11-16 ENCOUNTER — Ambulatory Visit (HOSPITAL_COMMUNITY): Payer: Medicaid Other

## 2022-11-16 ENCOUNTER — Ambulatory Visit (HOSPITAL_BASED_OUTPATIENT_CLINIC_OR_DEPARTMENT_OTHER)
Admission: RE | Admit: 2022-11-16 | Discharge: 2022-11-16 | Disposition: A | Discharge status: Home / Self Care | Payer: Medicaid Other | Attending: Orthopaedic Surgery | Admitting: Orthopaedic Surgery

## 2022-11-16 DIAGNOSIS — I10 Essential (primary) hypertension: Secondary | ICD-10-CM | POA: Insufficient documentation

## 2022-11-16 DIAGNOSIS — J449 Chronic obstructive pulmonary disease, unspecified: Secondary | ICD-10-CM | POA: Diagnosis not present

## 2022-11-16 DIAGNOSIS — Z01818 Encounter for other preprocedural examination: Secondary | ICD-10-CM

## 2022-11-16 DIAGNOSIS — Z6837 Body mass index (BMI) 37.0-37.9, adult: Secondary | ICD-10-CM | POA: Insufficient documentation

## 2022-11-16 DIAGNOSIS — M19012 Primary osteoarthritis, left shoulder: Secondary | ICD-10-CM

## 2022-11-16 DIAGNOSIS — M7522 Bicipital tendinitis, left shoulder: Secondary | ICD-10-CM | POA: Diagnosis not present

## 2022-11-16 DIAGNOSIS — Z853 Personal history of malignant neoplasm of breast: Secondary | ICD-10-CM | POA: Diagnosis not present

## 2022-11-16 DIAGNOSIS — Z87891 Personal history of nicotine dependence: Secondary | ICD-10-CM | POA: Diagnosis not present

## 2022-11-16 DIAGNOSIS — E669 Obesity, unspecified: Secondary | ICD-10-CM | POA: Insufficient documentation

## 2022-11-16 HISTORY — PX: REVERSE SHOULDER ARTHROPLASTY: SHX5054

## 2022-11-16 SURGERY — ARTHROPLASTY, SHOULDER, TOTAL, REVERSE
Anesthesia: General | Site: Shoulder | Laterality: Left

## 2022-11-16 MED ORDER — TRANEXAMIC ACID-NACL 1000-0.7 MG/100ML-% IV SOLN
INTRAVENOUS | Status: AC
  Filled 2022-11-16: qty 100

## 2022-11-16 MED ORDER — DEXAMETHASONE SODIUM PHOSPHATE 10 MG/ML IJ SOLN
INTRAMUSCULAR | Status: AC
  Filled 2022-11-16: qty 1

## 2022-11-16 MED ORDER — MEPERIDINE HCL 25 MG/ML IJ SOLN: 6.2500 mg | INTRAMUSCULAR | Status: DC | PRN

## 2022-11-16 MED ORDER — MIDAZOLAM HCL 2 MG/2ML IJ SOLN
2.0000 mg | Freq: Once | INTRAMUSCULAR | Status: AC
  Administered 2022-11-16: 2 mg via INTRAVENOUS

## 2022-11-16 MED ORDER — ALBUTEROL SULFATE HFA 108 (90 BASE) MCG/ACT IN AERS
INHALATION_SPRAY | RESPIRATORY_TRACT | Status: AC
  Filled 2022-11-16: qty 6.7

## 2022-11-16 MED ORDER — SCOPOLAMINE 1 MG/3DAYS TD PT72
MEDICATED_PATCH | TRANSDERMAL | Status: AC
  Filled 2022-11-16: qty 1

## 2022-11-16 MED ORDER — SCOPOLAMINE 1 MG/3DAYS TD PT72
1.0000 | MEDICATED_PATCH | TRANSDERMAL | Status: DC
  Administered 2022-11-16: 1.5 mg via TRANSDERMAL

## 2022-11-16 MED ORDER — LIDOCAINE 2% (20 MG/ML) 5 ML SYRINGE
INTRAMUSCULAR | Status: AC
  Filled 2022-11-16: qty 5

## 2022-11-16 MED ORDER — MIDAZOLAM HCL 2 MG/2ML IJ SOLN
INTRAMUSCULAR | Status: AC
  Filled 2022-11-16: qty 2

## 2022-11-16 MED ORDER — POVIDONE-IODINE 10 % EX SOLN
CUTANEOUS | Status: DC | PRN
  Administered 2022-11-16: 1 via TOPICAL

## 2022-11-16 MED ORDER — FENTANYL CITRATE (PF) 100 MCG/2ML IJ SOLN
INTRAMUSCULAR | Status: AC
  Filled 2022-11-16: qty 2

## 2022-11-16 MED ORDER — ONDANSETRON HCL 4 MG/2ML IJ SOLN
INTRAMUSCULAR | Status: AC
  Filled 2022-11-16: qty 2

## 2022-11-16 MED ORDER — CEFAZOLIN SODIUM-DEXTROSE 2-4 GM/100ML-% IV SOLN
2.0000 g | INTRAVENOUS | Status: AC
  Administered 2022-11-16: 2 g via INTRAVENOUS

## 2022-11-16 MED ORDER — OXYCODONE HCL 5 MG PO TABS
5.0000 mg | ORAL_TABLET | Freq: Once | ORAL | Status: AC | PRN
  Administered 2022-11-16: 5 mg via ORAL

## 2022-11-16 MED ORDER — CEFAZOLIN SODIUM-DEXTROSE 2-4 GM/100ML-% IV SOLN
INTRAVENOUS | Status: AC
  Filled 2022-11-16: qty 100

## 2022-11-16 MED ORDER — ONDANSETRON HCL 4 MG/2ML IJ SOLN
INTRAMUSCULAR | Status: DC | PRN
  Administered 2022-11-16: 4 mg via INTRAVENOUS

## 2022-11-16 MED ORDER — VANCOMYCIN HCL 1000 MG IV SOLR
INTRAVENOUS | Status: DC | PRN
  Administered 2022-11-16: 1000 mg via TOPICAL

## 2022-11-16 MED ORDER — ROCURONIUM BROMIDE 100 MG/10ML IV SOLN
INTRAVENOUS | Status: DC | PRN
  Administered 2022-11-16: 60 mg via INTRAVENOUS

## 2022-11-16 MED ORDER — PROPOFOL 10 MG/ML IV BOLUS
INTRAVENOUS | Status: AC
  Filled 2022-11-16: qty 20

## 2022-11-16 MED ORDER — ACETAMINOPHEN 500 MG PO TABS
1000.0000 mg | ORAL_TABLET | Freq: Once | ORAL | Status: AC
  Administered 2022-11-16: 1000 mg via ORAL

## 2022-11-16 MED ORDER — BUPIVACAINE-EPINEPHRINE (PF) 0.5% -1:200000 IJ SOLN
INTRAMUSCULAR | Status: DC | PRN
  Administered 2022-11-16: 15 mL via PERINEURAL

## 2022-11-16 MED ORDER — MIDAZOLAM HCL 2 MG/2ML IJ SOLN: 0.5000 mg | Freq: Once | INTRAMUSCULAR | Status: DC | PRN

## 2022-11-16 MED ORDER — SUGAMMADEX SODIUM 200 MG/2ML IV SOLN
INTRAVENOUS | Status: DC | PRN
  Administered 2022-11-16: 177 mg via INTRAVENOUS

## 2022-11-16 MED ORDER — HYDROMORPHONE HCL 1 MG/ML IJ SOLN
0.2500 mg | INTRAMUSCULAR | Status: DC | PRN
  Administered 2022-11-16: 0.5 mg via INTRAVENOUS

## 2022-11-16 MED ORDER — ALBUTEROL SULFATE HFA 108 (90 BASE) MCG/ACT IN AERS
INHALATION_SPRAY | RESPIRATORY_TRACT | Status: DC | PRN
  Administered 2022-11-16: 2 via RESPIRATORY_TRACT

## 2022-11-16 MED ORDER — ACETAMINOPHEN 500 MG PO TABS
ORAL_TABLET | ORAL | Status: AC
  Filled 2022-11-16: qty 2

## 2022-11-16 MED ORDER — GABAPENTIN 300 MG PO CAPS
ORAL_CAPSULE | ORAL | Status: AC
  Filled 2022-11-16: qty 1

## 2022-11-16 MED ORDER — ROCURONIUM BROMIDE 10 MG/ML (PF) SYRINGE
PREFILLED_SYRINGE | INTRAVENOUS | Status: AC
  Filled 2022-11-16: qty 10

## 2022-11-16 MED ORDER — PROMETHAZINE HCL 25 MG/ML IJ SOLN: 6.2500 mg | INTRAMUSCULAR | Status: DC | PRN

## 2022-11-16 MED ORDER — LIDOCAINE HCL (CARDIAC) PF 100 MG/5ML IV SOSY
PREFILLED_SYRINGE | INTRAVENOUS | Status: DC | PRN
  Administered 2022-11-16: 20 mg via INTRAVENOUS

## 2022-11-16 MED ORDER — OXYCODONE HCL 5 MG/5ML PO SOLN: 5.0000 mg | Freq: Once | ORAL | Status: AC | PRN

## 2022-11-16 MED ORDER — FENTANYL CITRATE (PF) 100 MCG/2ML IJ SOLN
INTRAMUSCULAR | Status: DC | PRN
  Administered 2022-11-16 (×2): 50 ug via INTRAVENOUS

## 2022-11-16 MED ORDER — SODIUM CHLORIDE 0.9 % IV SOLN
INTRAVENOUS | Status: AC | PRN
  Administered 2022-11-16: 1000 mL

## 2022-11-16 MED ORDER — LACTATED RINGERS IV SOLN: INTRAVENOUS | Status: DC

## 2022-11-16 MED ORDER — HYDROMORPHONE HCL 1 MG/ML IJ SOLN
INTRAMUSCULAR | Status: AC
  Filled 2022-11-16: qty 0.5

## 2022-11-16 MED ORDER — BUPIVACAINE LIPOSOME 1.3 % IJ SUSP
INTRAMUSCULAR | Status: DC | PRN
  Administered 2022-11-16: 10 mL via PERINEURAL

## 2022-11-16 MED ORDER — PHENYLEPHRINE HCL (PRESSORS) 10 MG/ML IV SOLN
INTRAVENOUS | Status: DC | PRN
  Administered 2022-11-16 (×3): 80 ug via INTRAVENOUS

## 2022-11-16 MED ORDER — OXYCODONE HCL 5 MG PO TABS
ORAL_TABLET | ORAL | Status: AC
  Filled 2022-11-16: qty 1

## 2022-11-16 MED ORDER — TRANEXAMIC ACID-NACL 1000-0.7 MG/100ML-% IV SOLN
1000.0000 mg | INTRAVENOUS | Status: AC
  Administered 2022-11-16: 1000 mg via INTRAVENOUS

## 2022-11-16 MED ORDER — PROPOFOL 10 MG/ML IV BOLUS
INTRAVENOUS | Status: DC | PRN
  Administered 2022-11-16: 100 mg via INTRAVENOUS
  Administered 2022-11-16: 50 mg via INTRAVENOUS

## 2022-11-16 MED ORDER — DEXAMETHASONE SODIUM PHOSPHATE 4 MG/ML IJ SOLN
INTRAMUSCULAR | Status: DC | PRN
  Administered 2022-11-16: 10 mg via INTRAVENOUS

## 2022-11-16 MED ORDER — FENTANYL CITRATE (PF) 100 MCG/2ML IJ SOLN
100.0000 ug | Freq: Once | INTRAMUSCULAR | Status: AC
  Administered 2022-11-16: 100 ug via INTRAVENOUS

## 2022-11-16 MED ORDER — VANCOMYCIN HCL 1000 MG IV SOLR
INTRAVENOUS | Status: AC
  Filled 2022-11-16: qty 20

## 2022-11-16 MED ORDER — GABAPENTIN 300 MG PO CAPS: 300.0000 mg | ORAL_CAPSULE | Freq: Once | ORAL | Status: DC

## 2022-11-16 SURGICAL SUPPLY — 85 items
AID PSTN UNV HD RSTRNT DISP (MISCELLANEOUS) ×1
ANCH SUT 2 JK 1.5X2.9 2 LD (Anchor) ×1 IMPLANT
ANCHOR SUT JK SZ 2 2.9 DBL SL (Anchor) IMPLANT
APL PRP STRL LF DISP 70% ISPRP (MISCELLANEOUS) ×1
BASEPLATE GLENOSPHERE 25 STD (Miscellaneous) IMPLANT
BIT DRILL 3.2 PERIPHERAL SCREW (BIT) IMPLANT
BIT DRILL JUGRKNT W/NDL BIT2.9 (DRILL) IMPLANT
BLADE SAW SGTL 73X25 THK (BLADE) ×1 IMPLANT
BLADE SURG 10 STRL SS (BLADE) IMPLANT
BLADE SURG 15 STRL LF DISP TIS (BLADE) IMPLANT
BLADE SURG 15 STRL SS (BLADE)
BRUSH SCRUB EZ PLAIN DRY (MISCELLANEOUS) ×1 IMPLANT
BSPLAT GLND STD 25 RVRS SHLDR (Miscellaneous) ×1 IMPLANT
CHLORAPREP W/TINT 26 (MISCELLANEOUS) ×1 IMPLANT
CLSR STERI-STRIP ANTIMIC 1/2X4 (GAUZE/BANDAGES/DRESSINGS) IMPLANT
COOLER ICEMAN CLASSIC (MISCELLANEOUS) ×1 IMPLANT
COVER BACK TABLE 60X90IN (DRAPES) ×1 IMPLANT
COVER MAYO STAND STRL (DRAPES) ×1 IMPLANT
CUP HUM SYS INSERT SZ 1/2 36 (Joint) IMPLANT
DRAPE IMP U-DRAPE 54X76 (DRAPES) IMPLANT
DRAPE INCISE IOBAN 66X45 STRL (DRAPES) ×1 IMPLANT
DRAPE POUCH INSTRU U-SHP 10X18 (DRAPES) ×1 IMPLANT
DRAPE U-SHAPE 47X51 STRL (DRAPES) IMPLANT
DRAPE U-SHAPE 76X120 STRL (DRAPES) ×2 IMPLANT
DRILL JUGGERKNOT W/NDL BIT 2.9 (DRILL) ×1
DRSG AQUACEL AG ADV 3.5X 6 (GAUZE/BANDAGES/DRESSINGS) ×1 IMPLANT
DRSG AQUACEL AG ADV 3.5X10 (GAUZE/BANDAGES/DRESSINGS) IMPLANT
ELECT BLADE 4.0 EZ CLEAN MEGAD (MISCELLANEOUS) ×1
ELECT REM PT RETURN 9FT ADLT (ELECTROSURGICAL) ×1
ELECTRODE BLDE 4.0 EZ CLN MEGD (MISCELLANEOUS) ×1 IMPLANT
ELECTRODE REM PT RTRN 9FT ADLT (ELECTROSURGICAL) ×1 IMPLANT
FACESHIELD WRAPAROUND (MASK) IMPLANT
FACESHIELD WRAPAROUND OR TEAM (MASK) ×2 IMPLANT
GLOVE BIO SURGEON STRL SZ 6 (GLOVE) ×2 IMPLANT
GLOVE BIO SURGEON STRL SZ7.5 (GLOVE) ×2 IMPLANT
GLOVE BIOGEL PI IND STRL 6.5 (GLOVE) ×1 IMPLANT
GLOVE BIOGEL PI IND STRL 8 (GLOVE) ×1 IMPLANT
GOWN STRL REUS W/ TWL LRG LVL3 (GOWN DISPOSABLE) ×2 IMPLANT
GOWN STRL REUS W/TWL LRG LVL3 (GOWN DISPOSABLE) ×1
GOWN STRL REUS W/TWL XL LVL3 (GOWN DISPOSABLE) ×1 IMPLANT
GUIDE PIN 3X75 SHOULDER (PIN) ×2
GUIDEWIRE GLENOID 2.5X220 (WIRE) IMPLANT
HANDPIECE INTERPULSE COAX TIP (DISPOSABLE) ×1
KIT STABILIZATION SHOULDER (MISCELLANEOUS) ×1 IMPLANT
MANIFOLD NEPTUNE II (INSTRUMENTS) ×1 IMPLANT
NDL MAYO TROCAR (NEEDLE) ×1 IMPLANT
NEEDLE MAYO TROCAR (NEEDLE) ×1 IMPLANT
PACK BASIN DAY SURGERY FS (CUSTOM PROCEDURE TRAY) ×1 IMPLANT
PACK SHOULDER (CUSTOM PROCEDURE TRAY) ×1 IMPLANT
PACK UNIVERSAL I (CUSTOM PROCEDURE TRAY) ×1 IMPLANT
PAD COLD SHLDR WRAP-ON (PAD) ×1 IMPLANT
PIN GUIDE 3X75 SHOULDER (PIN) IMPLANT
RESTRAINT HEAD UNIVERSAL NS (MISCELLANEOUS) ×1 IMPLANT
SCREW 5.0X18 (Screw) IMPLANT
SCREW 5.5X22 (Screw) IMPLANT
SCREW 5.5X26 (Screw) IMPLANT
SCREW BONE INTRNL SM 7 (Screw) IMPLANT
SET HNDPC FAN SPRY TIP SCT (DISPOSABLE) ×1 IMPLANT
SHEET MEDIUM DRAPE 40X70 STRL (DRAPES) ×1 IMPLANT
SLEEVE SCD COMPRESS KNEE MED (STOCKING) ×1 IMPLANT
SLING ARM FOAM STRAP LRG (SOFTGOODS) IMPLANT
SPHERE GLENOID LAT REV 36 (Joint) IMPLANT
SPIKE FLUID TRANSFER (MISCELLANEOUS) IMPLANT
SPONGE T-LAP 18X18 ~~LOC~~+RFID (SPONGE) ×1 IMPLANT
STAPLER VISISTAT 35W (STAPLE) ×1 IMPLANT
STEM HUM PLUS SHORT SZ1+ (Stem) IMPLANT
SUCTION FRAZIER HANDLE 10FR (MISCELLANEOUS) ×1
SUCTION TUBE FRAZIER 10FR DISP (MISCELLANEOUS) ×1 IMPLANT
SUT ETHIBOND 2 V 37 (SUTURE) IMPLANT
SUT ETHIBOND NAB CT1 #1 30IN (SUTURE) IMPLANT
SUT FIBERWIRE #2 38 T-5 BLUE (SUTURE) ×2
SUT FIBERWIRE #5 38 CONV NDL (SUTURE) ×2
SUT MNCRL AB 4-0 PS2 18 (SUTURE) IMPLANT
SUT VIC AB 0 CT1 27 (SUTURE) ×2
SUT VIC AB 0 CT1 27XBRD ANBCTR (SUTURE) ×2 IMPLANT
SUT VIC AB 2-0 CT1 27 (SUTURE) ×2
SUT VIC AB 2-0 CT1 TAPERPNT 27 (SUTURE) ×2 IMPLANT
SUT VIC AB 3-0 SH 27 (SUTURE)
SUT VIC AB 3-0 SH 27X BRD (SUTURE) IMPLANT
SUTURE FIBERWR #2 38 T-5 BLUE (SUTURE) IMPLANT
SUTURE FIBERWR #5 38 CONV NDL (SUTURE) ×2 IMPLANT
SYR 50ML LL SCALE MARK (SYRINGE) ×1 IMPLANT
TOWEL GREEN STERILE FF (TOWEL DISPOSABLE) ×3 IMPLANT
TRAY FOLEY MTR SLVR 16FR STAT (SET/KITS/TRAYS/PACK) ×1 IMPLANT
TUBE SUCTION HIGH CAP CLEAR NV (SUCTIONS) ×1 IMPLANT

## 2022-11-16 NOTE — Telephone Encounter (Signed)
Patient needs a note saying she is still under Dr Lawerance Bach care after the surgery she need before  Dec 15 for Social Services.  Best number to reach her mom  [phone]

## 2022-11-16 NOTE — Anesthesia Procedure Notes (Signed)
Anesthesia Regional Block: Interscalene brachial plexus block   Pre-Anesthetic Checklist: , timeout performed,  Correct Patient, Correct Site, Correct Laterality,  Correct Procedure, Correct Position, site marked,  Risks and benefits discussed,  Surgical consent,  Pre-op evaluation,  At surgeon's request and post-op pain management  Laterality: Left and Upper  Prep: chloraprep       Needles:  Injection technique: Single-shot  Needle Type: Echogenic Needle     Needle Length: 9cm  Needle Gauge: 21     Additional Needles:   Procedures:,,,, ultrasound used (permanent image in chart),,    Narrative:  Start time: 11/16/2022 7:44 AM End time: 11/16/2022 7:51 AM Injection made incrementally with aspirations every 5 mL.  Performed by: Personally  Anesthesiologist: Annye Asa, MD  Additional Notes: Pt identified in Holding room.  Monitors applied. Working IV access confirmed. Sterile prep L clavicle and neck.  #21ga ECHOgenic Arrow block needle to interscalene brachial plexus with US guidance.  15cc 0.5% Bupivacaine 1:200k epi, Exparel injected incrementally after negative test dose.  Patient asymptomatic, VSS, no heme aspirated, tolerated well.   Jenita Seashore, MD

## 2022-11-16 NOTE — Telephone Encounter (Signed)
Patient states she need to have someone to call her about a letter. Please Advise..[phone]

## 2022-11-16 NOTE — Op Note (Signed)
Date of Surgery: 11/16/2022  INDICATIONS: Ms. Mcglaun is a 51 y.o.-year-old female with left shoulder osteoarthritis which has failed conservative managment.  The risk and benefits of the procedure were discussed in detail and documented in the pre-operative evaluation.   PREOPERATIVE DIAGNOSIS: 1. Left shoulder osteoarthritis  POSTOPERATIVE DIAGNOSIS: Same.  PROCEDURE: 1. Left reverse shoulder arthroplasty 2. Left shoulder biceps tenodesis  SURGEON: Yevonne Pax MD  ASSISTANT: Raynelle Fanning, ATC  ANESTHESIA:  general plus interscalene nerve block  IV FLUIDS AND URINE: See anesthesia record.  ANTIBIOTICS: Ancef  ESTIMATED BLOOD LOSS: 10 mL.  IMPLANTS:  Implant Name Type Inv. Item Serial No. Manufacturer Lot No. LRB No. Used Action  SCREW BONE INTRNL SM 7 - [id number] Screw SCREW BONE INTRNL SM 7 [id number] [employer] [id number] Left 1 Implanted  BASEPLATE GLENOSPHERE 25 STD - [id number] Miscellaneous BASEPLATE GLENOSPHERE 25 STD [id number] [employer] [id number] Left 1 Implanted  SPHERE GLENOID LAT REV 36 - [id number] Joint SPHERE GLENOID LAT REV 36 [id number] [employer] [id number] Left 1 Implanted  SCREW 5.5X22 - [id number] Screw SCREW 5.5X22  [employer] ON SET Left 1 Implanted  SCREW 5.5X26 - [id number] Screw SCREW 5.5X26  [employer] ON SET Left 1 Implanted  SCREW 5.0X18 - [id number] Screw SCREW 5.0X18  [employer] ON SET Left 2 Implanted  STEM HUM PLUS SHORT SZ1+ - [id number] Stem STEM HUM PLUS SHORT SZ1+ [id number] [employer] [id number] Left 1 Implanted  Retentive Reversed Insert-Tornier   [id number]  [id number] Left 1 Implanted  ANCHOR SUT JK SZ 2 2.9 DBL SL - [id number] Anchor ANCHOR SUT JK SZ 2 2.9 DBL SL  ZIMMER RECON(ORTH,TRAU,BIO,SG) [id number] Left 1 Implanted    DRAINS: None  CULTURES: None  COMPLICATIONS: none  DESCRIPTION OF PROCEDURE:  Patient was identified in the preoperative holding area.  Anesthesia performed an interscalene nerve block after universal  timeout was performed with nursing.  Ancef was given 1 hour prior to skin incision.    The surgical site was scrubbed with a chlorhexidine scrub brush and alcohol.  The patient was then prepped with chlorhexidine skin prep.  The patient was subsequently taken back to the operating room.  Anesthesia was induced.  He was transferred to the beachchair position.  All bony prominences were padded.  Final timeout was again performed.     The bony landmarks of the shoulder were marked with a marking pen. A delto-pectoral incision was made, extending up approximately 5 inches. The wound with then irrigated with dilute betadine. Cephalic vein was identified, and an protected. This was retracted medially. Subdeltoid and subpectoral lesions were released. Neurovascular structures were carefully protected. The Gelpi retractor was used to retract the deltoid and pectoralis major. A 1 cm release was performed on the upper pectoralis.   The deltoid was retracted laterally with a Brown humeral retractor.  The conjoined tendon was identified. The cleido-pectoral fascia was excised.  The axillary nerve was palpated and carefully protected throughout the procedure. The biceps tendon was found and tenodesed to the upper pec with # 2 FiberWire.  Proximally the biceps tendon was removed up to the joint.  The bicipital groove was used for a landmark to establish rotator cuff interval. The subscap was tagged with a #2 FiberWire.  At this point the subscap was peeled off from the lesser tuberosity with care to avoid dissection distally in order to protect the axillary nerve.  Once the joint was exposed the proximal humerus was delivered with external rotation and extension of  the arm. The humerus was prepped initially by performing a humeral neck cut. This was done with the guide using 30 degrees of retroversion as a reference.  The head portion was removed.  A medullary sounding reamer was then used.  We subsequently placed our  guidewire through the center of the humeral head using the reference guide.  This was a size 1.  Metaphyseal reamer was then used.  Finally the size 1 broach was malleted into place with excellent purchase.  A tonsil clamp was used to attempt to pull this out with very good purchase   Attention was then turned to the glenoid.  Posteriorly a large Darach retractor was used.  A 360 Degree release of the subscapularis and glenoid were done. The capsule was released from the humerus.    Glenoid retractors were placed posteriorly, superiorly behind the biceps tendon and anteriorly on the glenoid neck. A 360-degree release of the capsule was performed with cautery.  The triceps was released off the inferior tubercle of the glenoid. The axillary nerve was carefully protected with the surgeon's index finger, retracting it and using cautery.   A guidepin was placed through the glenoid guide. The guidepin was drilled until it exited the cortex. The guidepin was over drilled. Next, the glenoid was prepared with the reamer  down to cortical bone.  The central peg hole was totally within the scapular neck tested with the probe.  The baseplate was then placed screwed securely with good purchase in position and then secured with 4 screws. In each case, they were drilled and measured and the appropriate length screw placed with excellent rigid fixation of the baseplate.    A 0 liner was then used with the appropriate broach.  This was brought to just the level of the reduction but not completely reduced.  A 0 constrained final poly was selected and impacted.    Appropriate tension was noted on the conjoined tendon and deltoid muscle.  Extension was stable, external and internal rotation as well.  The subscap was pulled over but as this was not able to reach comfortably decision was made not to repair in order to prevent limited in external rotation.  The wound was then irrigated. Vancomycin powder was placed in the wound  again for infection prevention.   The wound was then closed in layers with 0 Vicryl interrupted in the deep subcu followed by 2-0 Vicryl in the superficial subcu and staples for skin.  An Aquacel dressing was applied as well as an Naval architect.  A shoulder immobilizer was applied.          POSTOPERATIVE PLAN: She will be non weight bearing on left arm until seen by PT. She will be seen at 2 weeks for wound check. She will be placed on aspirin for blood clot prevention.  Yevonne Pax, MD 10:30 AM

## 2022-11-16 NOTE — Telephone Encounter (Signed)
Note completed and uploaded to [place]. RC to patient's mother who requested the letter be faxed. I informed her she can call back when she has the fax number

## 2022-11-16 NOTE — Anesthesia Procedure Notes (Signed)
Procedure Name: Intubation Date/Time: 11/16/2022 8:41 AM  Performed by: Ezequiel Kayser, CRNAPre-anesthesia Checklist: Patient identified, Emergency Drugs available, Suction available and Patient being monitored Patient Re-evaluated:Patient Re-evaluated prior to induction Oxygen Delivery Method: Circle System Utilized Preoxygenation: Pre-oxygenation with 100% oxygen Induction Type: IV induction Ventilation: Mask ventilation without difficulty Laryngoscope Size: Mac and 3 Grade View: Grade I Tube type: Oral Tube size: 7.0 mm Number of attempts: 1 Airway Equipment and Method: Stylet and Oral airway Placement Confirmation: ETT inserted through vocal cords under direct vision, positive ETCO2 and breath sounds checked- equal and bilateral Secured at: 22 cm Tube secured with: Tape Dental Injury: Teeth and Oropharynx as per pre-operative assessment

## 2022-11-16 NOTE — Progress Notes (Signed)
Assisted Dr. Annye Asa with left, interscalene , ultrasound guided block. Side rails up, monitors on throughout procedure. See vital signs in flow sheet. Tolerated Procedure well.

## 2022-11-16 NOTE — Transfer of Care (Signed)
Immediate Anesthesia Transfer of Care Note  Patient: Monica [name]  Procedure(s) Performed: LEFT REVERSE SHOULDER ARTHROPLASTY (Left: Shoulder)  Patient Location: PACU  Anesthesia Type:General and Regional  Level of Consciousness: drowsy  Airway & Oxygen Therapy: Patient Spontanous Breathing and Patient connected to face mask oxygen  Post-op Assessment: Report given to RN and Post -op Vital signs reviewed and stable  Post vital signs: Reviewed and stable  Last Vitals:  Vitals Value Taken Time  BP 141/66 11/16/22 1043  Temp    Pulse 81 11/16/22 1044  Resp 22 11/16/22 1044  SpO2 96 % 11/16/22 1044  Vitals shown include unvalidated device data.  Last Pain:  Vitals:   11/16/22 0718  TempSrc: Oral  PainSc: 8       Patients Stated Pain Goal: 10 (16/10/96 0454)  Complications: No notable events documented.

## 2022-11-16 NOTE — Discharge Instructions (Addendum)
Discharge Instructions    Attending Surgeon: Vanetta Mulders, MD Office Phone Number: [phone]   Diagnosis and Procedures:    Surgeries Performed: Left reverse shoulder arthroplasty  Discharge Plan:    Diet: Resume usual diet. Begin with light or bland foods.  Drink plenty of fluids.  Activity:  Keep sling and dressing in place until your follow up visit in Physical Therapy You are advised to go home directly from the hospital or surgical center. Restrict your activities.  GENERAL INSTRUCTIONS: 1.  Keep your surgical site elevated above your heart for at least 5-7 days or longer to prevent swelling. This will improve your comfort and your overall recovery following surgery.     2. Please call Dr. Eddie Dibbles office at [phone] with questions Monday-Friday during business hours. If no one answers, please leave a message and someone should get back to the patient within 24 hours. For emergencies please call 911 or proceed to the emergency room.   3. Patient to notify surgical team if experiences any of the following: Bowel/Bladder dysfunction, uncontrolled pain, nerve/muscle weakness, incision with increased drainage or redness, nausea/vomiting and Fever greater than 101.0 F.  Be alert for signs of infection including redness, streaking, odor, fever or chills. Be alert for excessive pain or bleeding and notify your surgeon immediately.  WOUND INSTRUCTIONS:   Leave your dressing/cast/splint in place until your post operative visit.  Keep it clean and dry.  Always keep the incision clean and dry until the staples/sutures are removed. If there is no drainage from the incision you should keep it open to air. If there is drainage from the incision you must keep it covered at all times until the drainage stops  Do not soak in a bath tub, hot tub, pool, lake or other body of water until 21 days after your surgery and your incision is completely dry and healed.  If you have  removable sutures (or staples) they must be removed 10-14 days (unless otherwise instructed) from the day of your surgery.     1)  Elevate the extremity as much as possible.  2)  Keep the dressing clean and dry.  3)  Please call us if the dressing becomes wet or dirty.  4)  If you are experiencing worsening pain or worsening swelling, please call.     MEDICATIONS: Resume all previous home medications at the previous prescribed dose and frequency unless otherwise noted Start taking the  pain medications on an as-needed basis as prescribed  Please taper down pain medication over the next week following surgery.  Ideally you should not require a refill of any narcotic pain medication.  Take pain medication with food to minimize nausea. In addition to the prescribed pain medication, you may take over-the-counter pain relievers such as Tylenol.  Do NOT take additional tylenol if your pain medication already has tylenol in it.  Aspirin '325mg'$  daily for four weeks.      FOLLOWUP INSTRUCTIONS: 1. Follow up at the Physical Therapy Clinic 3-4 days following surgery. This appointment should be scheduled unless other arrangements have been made.The Physical Therapy scheduling number is [phone] if an appointment has not already been arranged.  2. Contact Dr. Eddie Dibbles office during office hours at [phone] or the practice after hours line at [phone] for non-emergencies. For medical emergencies call 911.   Discharge Location: Home   You may have Tylenol again after 1:30pm today, if needed.   Post Anesthesia Home Care Instructions  Activity:  Get plenty of rest for the remainder of the day. A responsible individual must stay with you for 24 hours following the procedure.  For the next 24 hours, DO NOT: -Drive a car -Paediatric nurse -Drink alcoholic beverages -Take any medication unless instructed by your physician -Make any legal decisions or sign important  papers.  Meals: Start with liquid foods such as gelatin or soup. Progress to regular foods as tolerated. Avoid greasy, spicy, heavy foods. If nausea and/or vomiting occur, drink only clear liquids until the nausea and/or vomiting subsides. Call your physician if vomiting continues.  Special Instructions/Symptoms: Your throat may feel dry or sore from the anesthesia or the breathing tube placed in your throat during surgery. If this causes discomfort, gargle with warm salt water. The discomfort should disappear within 24 hours.  If you had a scopolamine patch placed behind your ear for the management of post- operative nausea and/or vomiting:  1. The medication in the patch is effective for 72 hours, after which it should be removed.  Wrap patch in a tissue and discard in the trash. Wash hands thoroughly with soap and water. 2. You may remove the patch earlier than 72 hours if you experience unpleasant side effects which may include dry mouth, dizziness or visual disturbances. 3. Avoid touching the patch. Wash your hands with soap and water after contact with the patch.   Regional Anesthesia Blocks  1. Numbness or the inability to move the "blocked" extremity may last from 3-48 hours after placement. The length of time depends on the medication injected and your individual response to the medication. If the numbness is not going away after 48 hours, call your surgeon.  2. The extremity that is blocked will need to be protected until the numbness is gone and the  Strength has returned. Because you cannot feel it, you will need to take extra care to avoid injury. Because it may be weak, you may have difficulty moving it or using it. You may not know what position it is in without looking at it while the block is in effect.  3. For blocks in the legs and feet, returning to weight bearing and walking needs to be done carefully. You will need to wait until the numbness is entirely gone and the strength  has returned. You should be able to move your leg and foot normally before you try and bear weight or walk. You will need someone to be with you when you first try to ensure you do not fall and possibly risk injury.  4. Bruising and tenderness at the needle site are common side effects and will resolve in a few days.  5. Persistent numbness or new problems with movement should be communicated to the surgeon or the [facility] [phone] [place] [phone]).

## 2022-11-16 NOTE — Anesthesia Postprocedure Evaluation (Signed)
Anesthesia Post Note  Patient: Monica [name]  Procedure(s) Performed: LEFT REVERSE SHOULDER ARTHROPLASTY (Left: Shoulder)     Patient location during evaluation: PACU Anesthesia Type: General and Regional Level of consciousness: awake and alert, patient cooperative and oriented Pain management: pain level controlled Vital Signs Assessment: post-procedure vital signs reviewed and stable Respiratory status: spontaneous breathing, nonlabored ventilation and respiratory function stable Cardiovascular status: blood pressure returned to baseline and stable Postop Assessment: no apparent nausea or vomiting and able to ambulate Anesthetic complications: no   No notable events documented.  Last Vitals:  Vitals:   11/16/22 1200 11/16/22 1215  BP: (!) 140/83 133/89  Pulse: 81   Resp: 18   Temp:  36.9 C  SpO2: 95% 95%    Last Pain:  Vitals:   11/16/22 1215  TempSrc:   PainSc: 6                  Mylz Yuan,E. Taite Baldassari

## 2022-11-16 NOTE — Brief Op Note (Signed)
   Brief Op Note  Date of Surgery: 11/16/2022  Preoperative Diagnosis: LEFT SHOULDER OSTEOARTHRITIS  Postoperative Diagnosis: same  Procedure: Procedure(s): LEFT REVERSE SHOULDER ARTHROPLASTY  Implants: Implant Name Type Inv. Item Serial No. Manufacturer Lot No. LRB No. Used Action  SCREW BONE INTRNL SM 7 - [id number] Screw SCREW BONE INTRNL SM 7 [id number] [employer] [id number] Left 1 Implanted  BASEPLATE GLENOSPHERE 25 STD - [id number] Miscellaneous BASEPLATE GLENOSPHERE 25 STD [id number] [employer] [id number] Left 1 Implanted  SPHERE GLENOID LAT REV 36 - [id number] Joint SPHERE GLENOID LAT REV 36 [id number] [employer] [id number] Left 1 Implanted  SCREW 5.5X22 - [id number] Screw SCREW 5.5X22  [employer] ON SET Left 1 Implanted  SCREW 5.5X26 - [id number] Screw SCREW 5.5X26  [employer] ON SET Left 1 Implanted  SCREW 5.0X18 - [id number] Screw SCREW 5.0X18  [employer] ON SET Left 2 Implanted  STEM HUM PLUS SHORT SZ1+ - [id number] Stem STEM HUM PLUS SHORT SZ1+ [id number] [employer] [id number] Left 1 Implanted  Retentive Reversed Insert-Tornier   [id number]  [id number] Left 1 Implanted  ANCHOR SUT JK SZ 2 2.9 DBL SL - [id number] Anchor ANCHOR SUT JK SZ 2 2.9 DBL SL  ZIMMER RECON(ORTH,TRAU,BIO,SG) [id number] Left 1 Implanted    Surgeons: Surgeon(s): Vanetta Mulders, MD  Anesthesia: General    Estimated Blood Loss: See anesthesia record  Complications: None  Condition to PACU: Stable  Yevonne Pax, MD 11/16/2022 10:30 AM

## 2022-11-16 NOTE — Anesthesia Preprocedure Evaluation (Addendum)
Anesthesia Evaluation  Patient identified by MRN, date of birth, ID band Patient awake    Reviewed: Allergy & Precautions, NPO status , Patient's Chart, lab work & pertinent test results  History of Anesthesia Complications Negative for: history of anesthetic complications  Airway Mallampati: II  TM Distance: >3 FB Neck ROM: Full    Dental  (+) Edentulous Upper, Edentulous Lower   Pulmonary COPD,  COPD inhaler, former smoker   breath sounds clear to auscultation       Cardiovascular hypertension, Pt. on medications (-) angina  Rhythm:Regular Rate:Normal  10/2021 ECHO: EF 60-65%. The LV has normal function, no regional wall motion abnormalities. Left ventricular diastolic parameters were normal. RVF is normal, no significant valvular abnormalities     Neuro/Psych   Anxiety     negative neurological ROS     GI/Hepatic negative GI ROS, Neg liver ROS,,,  Endo/Other  negative endocrine ROS    Renal/GU negative Renal ROS     Musculoskeletal   Abdominal  (+) + obese  Peds  Hematology negative hematology ROS (+)   Anesthesia Other Findings Breast cancer  Reproductive/Obstetrics negative OB ROS                             Anesthesia Physical Anesthesia Plan  ASA: 2  Anesthesia Plan: General   Post-op Pain Management: Regional block* and Tylenol PO (pre-op)*   Induction: Intravenous  PONV Risk Score and Plan: 3 and Ondansetron, Dexamethasone and Scopolamine patch - Pre-op  Airway Management Planned: Oral ETT  Additional Equipment: None  Intra-op Plan:   Post-operative Plan: Extubation in OR  Informed Consent: I have reviewed the patients History and Physical, chart, labs and discussed the procedure including the risks, benefits and alternatives for the proposed anesthesia with the patient or authorized representative who has indicated his/her understanding and acceptance.        Plan Discussed with: CRNA and Surgeon  Anesthesia Plan Comments: (Plan routine monitors, GETA with interscalene block for post op analgesia)        Anesthesia Quick Evaluation

## 2022-11-16 NOTE — Interval H&P Note (Signed)
History and Physical Interval Note:  11/16/2022 8:14 AM  Monica [name]  has presented today for surgery, with the diagnosis of LEFT SHOULDER OSTEOARTHRITIS.  The various methods of treatment have been discussed with the patient and family. After consideration of risks, benefits and other options for treatment, the patient has consented to  Procedure(s): LEFT REVERSE SHOULDER ARTHROPLASTY (Left) as a surgical intervention.  The patient's history has been reviewed, patient examined, no change in status, stable for surgery.  I have reviewed the patient's chart and labs.  Questions were answered to the patient's satisfaction.     Vanetta Mulders

## 2022-11-17 ENCOUNTER — Telehealth: Payer: Self-pay | Admitting: Orthopaedic Surgery

## 2022-11-17 ENCOUNTER — Encounter (HOSPITAL_BASED_OUTPATIENT_CLINIC_OR_DEPARTMENT_OTHER): Payer: Self-pay | Admitting: Orthopaedic Surgery

## 2022-11-17 NOTE — Telephone Encounter (Signed)
HHPT and [place] referral placed to [facility]

## 2022-11-17 NOTE — Telephone Encounter (Signed)
RC to patient informing her Referral was sent to [facility]. She expressed she was able to retrieve her letter off of MyChart

## 2022-11-17 NOTE — Telephone Encounter (Signed)
Patient requesting a call to discuss having someone come in to help her during the day since she lives by herself. Per 11/11/22 ov note Dr. Sammuel Hines was sending in a Home Health order, but I do not see one in patient's chart. Please call to discuss with patient.

## 2022-11-23 ENCOUNTER — Ambulatory Visit (HOSPITAL_COMMUNITY): Payer: Medicaid Other | Attending: *Deleted | Admitting: Occupational Therapy

## 2022-11-23 ENCOUNTER — Encounter (HOSPITAL_COMMUNITY): Payer: Self-pay | Admitting: Occupational Therapy

## 2022-11-23 DIAGNOSIS — M25612 Stiffness of left shoulder, not elsewhere classified: Secondary | ICD-10-CM | POA: Diagnosis present

## 2022-11-23 DIAGNOSIS — R29898 Other symptoms and signs involving the musculoskeletal system: Secondary | ICD-10-CM | POA: Diagnosis present

## 2022-11-23 DIAGNOSIS — M25512 Pain in left shoulder: Secondary | ICD-10-CM | POA: Diagnosis present

## 2022-11-23 NOTE — Progress Notes (Signed)
Patient Care Team: Muse, Noel Journey., PA-C as PCP - General Nicholas Lose, MD as Consulting Physician (Hematology and Oncology) Eppie Gibson, MD as Attending Physician (Radiation Oncology) Erroll Luna, MD as Consulting Physician (General Surgery)  DIAGNOSIS: No diagnosis found.  SUMMARY OF ONCOLOGIC HISTORY: Oncology History  Primary malignant neoplasm of upper outer quadrant of female breast, right ([place])  08/12/2020 Initial Diagnosis   Screening mammogram on 07/07/20 showed an asymmetry. Diagnostic mammogram and [place] on 07/29/20 showed a 0.8cm mass at the 10 o'clock position and no right axillary adenopathy. Biopsy on 08/12/20 showed invasive and in situ mammary carcinoma, grade 2, HER-2 equivocal by IHC, positive by FISH, ER+ 80%, PR+ 60%, Ki67 15%.   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  Variants of uncertain significance detected in ATM (c.131A>G (p.Asp44Gly) and c.4658A>C (p.Glu1553Ala)) and WRN (c.2825+6G>T (intronic)).  The report date is October 02, 2020.   The Multi-Cancer Panel offered by [facility] includes sequencing and/or deletion duplication testing of the following 85 genes: AIP, ALK, APC, ATM, AXIN2,BAP1,  BARD1, BLM, BMPR1A, BRCA1, BRCA2, BRIP1, CASR, CDC73, CDH1, CDK4, CDKN1B, CDKN1C, CDKN2A (p14ARF), CDKN2A (p16INK4a), CEBPA, CHEK2, CTNNA1, DICER1,  DIS3L2, EGFR (c.2369C>T, p.Thr790Met variant only), EPCAM (Deletion/duplication testing only), FH, FLCN, GATA2, GPC3, GREM1 (Promoter region deletion/duplication testing only), HOXB13 (c.251G>A, p.Gly84Glu), HRAS, KIT, MAX, MEN1, MET, MITF (c.952G>A, p.Glu318Lys variant only), MLH1, MSH2, MSH3, MSH6, MUTYH, NBN, NF1, NF2, NTHL1, PALB2, PDGFRA, PHOX2B, PMS2, POLD1, POLE, POT1, PRKAR1A, PTCH1, PTEN, RAD50, RAD51C, RAD51D, RB1, RECQL4, RET, RNF43, RUNX1, SDHAF2, SDHA (sequence changes only), SDHB, SDHC, SDHD, SMAD4, SMARCA4, SMARCB1, SMARCE1, STK11, SUFU, TERC, TERT, TMEM127, TP53, TSC1, TSC2, VHL, WRN and WT1.   Amended report: The variant of uncertain significance (VUS) in [place] at  c.2825+6G>T (Intronic) has been reclassified to likely benign.  The change in variant classification was made as a result of re-review of evidence in light of new variant interpretation guidelines and/or new information. The amended report date is December 18, 2020.    02/10/2021 - 03/24/2021 Radiation Therapy   Site Technique Total Dose (Gy) Dose per Fx (Gy) Completed Fx Beam Energies  Breast, Right: Breast_Rt 3D 50/50 2 25/25 10X  Breast, Right: Breast_Rt_PAB_SCV 3D 50/50 2 25/25 6X, 15X  Breast, Right: Breast_Rt_Bst 3D 10/10 2 5/5 6X, 10X     04/13/2021 -  Anti-estrogen oral therapy   Initially anastrozole switched to letrozole for fatigue issues     CHIEF COMPLIANT: Follow-up right breast cancer  INTERVAL HISTORY: Monica [name] is a 51 y.o. with above-mentioned history of right breast cancer underwent a right lumpectomy followed by re-excision, adjuvant chemotherapy, and radiation, completed Herceptin maintenance therapy. She presents to the clinic for a follow-up.     ALLERGIES:  has No Known Allergies.  MEDICATIONS:  Current Outpatient Medications  Medication Sig Dispense Refill   albuterol (VENTOLIN HFA) 108 (90 Base) MCG/ACT inhaler Inhale 2 puffs into the lungs every  4 (four) hours as needed for  wheezing or shortness of breath. 8 g 3   aspirin EC 325 MG tablet Take 1 tablet (325 mg total) by mouth daily. 30 tablet 0   baclofen (LIORESAL) 10 MG tablet Take 1 tablet (10 mg total) by mouth at bedtime as needed for muscle spasms. 90 each 1   budesonide-formoterol (SYMBICORT) 160-4.5 MCG/ACT inhaler Inhale 2 puffs into the lungs in the morning and at bedtime. 10.2 g 5   calcium citrate (CALCITRATE - DOSED IN MG ELEMENTAL CALCIUM) 950 (200 Ca) MG tablet Take 200 mg of elemental calcium by mouth daily.     Cholecalciferol (VITAMIN D3) 125 MCG (5000 UT) TABS Take 1 tablet by mouth daily.     clonazePAM (KLONOPIN) 0.5 MG tablet Take 0.5 mg by mouth daily as needed.     escitalopram (LEXAPRO) 10 MG tablet Take 10 mg by mouth daily.     gabapentin (NEURONTIN) 300 MG capsule Take 1 capsule (300 mg total) by mouth 3 (three) times daily. 90 capsule 2   HYDROcodone-acetaminophen (NORCO/VICODIN) 5-325 MG tablet Take 1 tablet by mouth every 6 (six) hours as needed. 15 tablet 0   letrozole (FEMARA) 2.5 MG tablet TAKE 1 TABLET ONCE DAILY. 30 tablet 0   lisinopril (ZESTRIL) 10 MG tablet Take 1 tablet (10 mg total) by mouth daily.     oxyCODONE (OXY IR/ROXICODONE) 5 MG immediate release tablet Take 1 tablet (5 mg total) by mouth every 4 (four) hours as needed (severe pain). 20 tablet 0   prazosin (MINIPRESS) 1 MG capsule Take 1 mg by mouth at bedtime.     No current facility-administered medications for this visit.    PHYSICAL EXAMINATION: ECOG PERFORMANCE STATUS: {CHL ONC ECOG [phone]}  There were no vitals filed for this visit. There were no vitals filed for this visit.  BREAST:*** No palpable masses or nodules in either right or left breasts. No palpable axillary supraclavicular or infraclavicular adenopathy no breast tenderness or nipple discharge. (exam performed in the presence of a chaperone)  LABORATORY DATA:  I have reviewed the data as listed    Latest Ref Rng & Units 07/31/2022     2:09 PM 02/15/2022    9:33 AM 11/17/2021    8:48 AM  CMP  Glucose 70 - 99 mg/dL 128  96  87   BUN 6 - 20 mg/dL _0 Creatinine 0.44 - 1.00 mg/dL 0.85  0.81  0.94   Sodium 135 - 145 mmol/L 139  140  141   Potassium 3.5 - 5.1 mmol/L 2.9  3.5  3.6   Chloride 98 - 111 mmol/L 107  109  107   CO2 22 - 32 mmol/L _1 Calcium 8.9 - 10.3 mg/dL 9.2  9.8  9.2   Total Protein 6.5 - 8.1 g/dL  7.1  7.1   Total Bilirubin 0.3 - 1.2 mg/dL  0.6  0.5   Alkaline Phos 38 - 126 U/L  104  110   AST 15 - 41 U/L  27  29   ALT 0 - 44 U/L  23  25     Lab Results  Component Value Date   WBC 9.9 07/31/2022   HGB 13.4 07/31/2022   HCT 38.0 07/31/2022   MCV 81.4 07/31/2022   PLT 210 07/31/2022   NEUTROABS 7.4 07/31/2022    ASSESSMENT & PLAN:  No problem-specific Assessment & Plan notes found for this  encounter.    No orders of the defined types were placed in this encounter.  The patient has a good understanding of the overall plan. she agrees with it. she will call with any problems that may develop before the next visit here. Total time spent: 30 mins including face to face time and time spent for planning, charting and co-ordination of care   Suzzette Righter, [place] 11/23/22    I Gardiner Coins am acting as a Education administrator for [employer]  ***

## 2022-11-23 NOTE — Patient Instructions (Signed)
COMPLETE PENDULUM EXERCISES FOR A MINUTE EACH, 3-5 TIMES PER DAY.  1) ROM: Pendulum (Side-to-Side)  Bend forward 90 at waist, using table for support. Rock body side to side to swing arm.  Do __3-5__ sessions per day.   2) Pendulum Forward/Back   Bend forward 90 at waist, using table for support. Rock body forward and back to swing arm. Do __3-5__ sessions per day.   3) Pendulum Circular   Bend forward 90 at waist, leaning on table for support. Rock body in a circular pattern to move arm clockwise then counterclockwise. Do __3-5__ sessions per day.   4) AROM: Wrist Extension   With right palm down, bend wrist up. Repeat 10____ times per set. Do ____ sets per session. Do __3__ sessions per day.    5) AROM: Wrist Flexion   With right palm up, bend wrist up. Repeat ___10_ times per set. Do ____ sets per session. Do __3__ sessions per day.    6) AROM: Forearm Pronation / Supination   With right arm in handshake position, slowly rotate palm down until stretch is felt. Relax. Then rotate palm up until stretch is felt. Repeat __10__ times per set. Do ____ sets per session. Do __3__ sessions per day.    7) AFlexion (Passive)   Use other hand to bend elbow, with thumb toward same shoulder. Do NOT force this motion.  Repeat __10__ times. Do __3-5__ sessions per day.  Copyright  VHI. All rights reserved.

## 2022-11-23 NOTE — Therapy (Signed)
OUTPATIENT OCCUPATIONAL THERAPY ORTHO EVALUATION  Patient Name: Monica [name] MRN: [id number] DOB:07-07-1971, 51 y.o., female Today's Date: 11/23/2022  PCP: Royce Macadamia, PA-C REFERRING PROVIDER: Vanetta Mulders, MD  END OF SESSION:  OT End of Session - 11/23/22 1448     Visit Number 1    Number of Visits 20    Date for OT Re-Evaluation 01/22/23   mini-reassessment 12/23/21   Authorization Type [employer]    Authorization Time Period Requesting 3 visits for 2023    Authorization - Visit Number 0    Authorization - Number of Visits 3    OT Start Time 0254    OT Stop Time 1435    OT Time Calculation (min) 50 min    Activity Tolerance Patient tolerated treatment well    Behavior During Therapy [facility] for tasks assessed/performed             Past Medical History:  Diagnosis Date   Asthma    Back pain    Breast cancer ([place])    Buttock pain    Family history of breast cancer 09/22/2020   Hypertension    Personal history of chemotherapy    Personal history of radiation therapy    Past Surgical History:  Procedure Laterality Date   ABDOMINAL HYSTERECTOMY     APPENDECTOMY     BREAST LUMPECTOMY WITH RADIOACTIVE SEED AND SENTINEL LYMPH NODE BIOPSY Right 09/10/2020   Procedure: RIGHT BREAST LUMPECTOMY WITH RADIOACTIVE SEED AND SENTINEL LYMPH NODE MAPPING;  Surgeon: Erroll Luna, MD;  Location: [place];  Service: General;  Laterality: Right;   PORTACATH PLACEMENT Right 10/16/2020   Procedure: INSERTION PORT-A-CATH WITH ULTRASOUND GUIDANCE;  Surgeon: Erroll Luna, MD;  Location: [place];  Service: General;  Laterality: Right;   RE-EXCISION OF BREAST LUMPECTOMY Right 10/16/2020   Procedure: RE-EXCISION OF RIGHT BREAST LUMPECTOMY;  Surgeon: Erroll Luna, MD;  Location: [place];  Service: General;  Laterality: Right;   REVERSE SHOULDER ARTHROPLASTY Left 11/16/2022   Procedure: LEFT REVERSE SHOULDER  ARTHROPLASTY;  Surgeon: Vanetta Mulders, MD;  Location: [place];  Service: Orthopedics;  Laterality: Left;   Patient Active Problem List   Diagnosis Date Noted   Primary osteoarthritis, left shoulder 11/16/2022   Spondylolisthesis at L5-S1 level 01/04/2022   Lumbar radiculopathy 01/04/2022   Trochanteric bursitis of left hip 11/10/2021   Muscle cramp, nocturnal 11/10/2021   Port-A-Cath in place 12/29/2020   Genetic testing 10/08/2020   Family history of breast cancer 09/22/2020   Primary malignant neoplasm of upper outer quadrant of female breast, right ([place]) 08/27/2020    ONSET DATE: 11/16/22  REFERRING DIAG: s/p left reverse TSA  THERAPY DIAG:  Acute pain of left shoulder  Stiffness of left shoulder, not elsewhere classified  Other symptoms and signs involving the musculoskeletal system  Rationale for Evaluation and Treatment: Rehabilitation  SUBJECTIVE:   SUBJECTIVE STATEMENT: S: It's hurting.  Pt accompanied by: self and family member  PERTINENT HISTORY: Pt is s/p left reverse TSA on 11/16/22. Pt reports she has been wearing the blue sling and has not straightened her arm since the surgery. She has high pain and is fearful of movement due to potential pain. Pt was referred to occupational therapy for evaluation and treatment by Dr. Vanetta Mulders.   PRECAUTIONS: Shoulder; waiting on protocol from MD  WEIGHT BEARING RESTRICTIONS: Yes NWB  PAIN:  Are you having pain? Yes: NPRS scale: 7/10 Pain location: left shoulder Pain description: sharp,  toothache Aggravating factors: cold, rain Relieving factors: pain medication  FALLS: Has patient fallen in last 6 months? No  LIVING ENVIRONMENT: Lives with: lives alone Lives in: House/apartment Stairs: No Has following equipment at home: shower chair and Grab bars  PLOF: Independent  PATIENT GOALS: To be able to use the LUE as her non-dominant.   NEXT MD VISIT: 12/01/22  OBJECTIVE:   HAND  DOMINANCE: Right  ADLs: Overall ADLs: Pt reports everything is difficult-dressing, putting her hair wrap on, cooking, sweeping, putting shoes on, bathing tasks. Pt with difficulty sleeping, is sleeping on her couch.    FUNCTIONAL OUTCOME MEASURES: Quick Dash: 97.73   UPPER EXTREMITY ROM:     Passive ROM Left eval  Shoulder flexion 43  Shoulder abduction 42  Shoulder internal rotation 59  Shoulder external rotation -34  (Blank rows = not tested)      Active ROM Left eval  Shoulder flexion   Shoulder abduction   Shoulder internal rotation   Shoulder external rotation   (Blank rows = not tested)   UPPER EXTREMITY MMT:     MMT Left eval  Shoulder flexion   Shoulder abduction   Shoulder internal rotation   Shoulder external rotation   (Blank rows = not tested)  HAND FUNCTION: Grip strength: Right: 20 lbs; Left: 0 lbs  SENSATION: Pt reports tingling pins/needles in her left hand.   EDEMA: Intermittent  COGNITION: Overall cognitive status: Within functional limits for tasks assessed  OBSERVATIONS: pt with max fascial restrictions in left upper arm, anterior shoulder, trapezius, and scapular regions. Pt with max guarding and fear of pain with movement.    TODAY'S TREATMENT:                                                                                                                              DATE: N/A-eval only     PATIENT EDUCATION: Education details: Pendulums, elbow AA/ROM, wrist A/ROM Person educated: Patient and Parent Education method: Explanation, Demonstration, and Handouts Education comprehension: verbalized understanding and returned demonstration  HOME EXERCISE PROGRAM: Eval: Pendulums, elbow AA/ROM, wrist A/ROM  GOALS: Goals reviewed with patient? Yes  SHORT TERM GOALS: Target date: 12/23/22  Pt will be provided with and educated on HEP to improve ability to use LUE as assist during ADL tasks.   Goal status: INITIAL  2.  Pt will  increase P/ROM of LUE to at least 50% ROM to improve ability to perform dressing tasks using compensatory strategies and minimial physical assistance.   Goal status: INITIAL  3.  Pt will increase LUE strength to 3-/5 to improve ability to use LUE to reach items at waist height during ADLs such as eating or grooming.   Goal status: INITIAL  4.  Pt will decrease LUE fascial restrictions to mod amounts or less to improve ability to perform low level functional reaching tasks and improve tolerance to HEP and ROM tasks.   Goal status: INITIAL  LONG TERM GOALS: Target date: 01/23/23  Pt will decrease pain in LUE to 4/10 or less to improve ability to sleep in the bed for at least 2 hours at night.   Goal status: INITIAL  2.  Pt will increase A/ROM of LUE to at least 75% range to improve ability to reach up and operate her head wrap and reach back to adjust her pants.   Goal status: INITIAL  3.  Pt will decrease LUE fascial restrictions to minimal amounts or less to improve ability to perform functional reaching tasks when getting items out of the cabinets during meal preparation or when getting items from a closet.  Goal status: INITIAL  4.  Pt will increase LUE strength to a 4/5 or greater to improve ability to lift pots and pans and to perform housekeeping tasks.   Goal status: INITIAL    ASSESSMENT:  CLINICAL IMPRESSION: Patient is a 51 y.o. female who was seen today for occupational therapy evaluation s/p left reverse TSA on 11/23/22.  Pt with max guarding and requiring consistent cuing to allow for gentle passive stretching. Pt with pain, stiffness, and fear of mobility limiting ability to perform tasks and allow for manipulation of LUE. Extensive education on rehab process and the pain that may accompany movement initially. Pt and mother verbalize understanding.    PERFORMANCE DEFICITS: in functional skills including ADLs, IADLs, coordination, proprioception, edema, ROM,  strength, pain, fascial restrictions, skin integrity, and UE functional use  IMPAIRMENTS: are limiting patient from ADLs, IADLs, rest and sleep, and leisure.   COMORBIDITIES: has no other co-morbidities that affects occupational performance. Patient will benefit from skilled OT to address above impairments and improve overall function.  MODIFICATION OR ASSISTANCE TO COMPLETE EVALUATION: No modification of tasks or assist necessary to complete an evaluation.  OT OCCUPATIONAL PROFILE AND HISTORY: Problem focused assessment: Including review of records relating to presenting problem.  CLINICAL DECISION MAKING: LOW - limited treatment options, no task modification necessary  REHAB POTENTIAL: Good  EVALUATION COMPLEXITY: Low      PLAN:  OT FREQUENCY: 3x/week  OT DURATION: 8 weeks  PLANNED INTERVENTIONS: self care/ADL training, therapeutic exercise, therapeutic activity, manual therapy, passive range of motion, splinting, electrical stimulation, patient/family education, and Re-evaluation  CONSULTED AND AGREED WITH PLAN OF CARE: Patient  PLAN FOR NEXT SESSION: Follow up on HEP, begin with gentle myofascial release and passive stretching, continue to pendulums progressing to therapy ball stretches or table slides as pt is able to tolerate   Guadelupe Sabin, OTR/L  [phone] 11/23/2022, 2:49 PM

## 2022-11-24 ENCOUNTER — Other Ambulatory Visit: Payer: Self-pay

## 2022-11-24 ENCOUNTER — Inpatient Hospital Stay: Payer: Medicaid Other | Attending: Hematology and Oncology | Admitting: Hematology and Oncology

## 2022-11-24 VITALS — BP 140/83 | HR 91 | Temp 97.5°F | Resp 18 | Wt 193.4 lb

## 2022-11-24 DIAGNOSIS — Z170 Estrogen receptor positive status [ER+]: Secondary | ICD-10-CM | POA: Diagnosis not present

## 2022-11-24 DIAGNOSIS — C50411 Malignant neoplasm of upper-outer quadrant of right female breast: Secondary | ICD-10-CM | POA: Diagnosis not present

## 2022-11-24 DIAGNOSIS — Z79811 Long term (current) use of aromatase inhibitors: Secondary | ICD-10-CM | POA: Insufficient documentation

## 2022-11-24 DIAGNOSIS — Z923 Personal history of irradiation: Secondary | ICD-10-CM | POA: Diagnosis not present

## 2022-11-24 NOTE — Assessment & Plan Note (Addendum)
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  ER 80%, PR 60%, Ki-67 15%, HER-2 equivocal by IHC positive for fish 10/11/20: Re-excision: Neg   Recommendation: 1. Adjuvant chemotherapy with Taxol Herceptin followed by Herceptin maintenance for 1 year 2. Adjuvant radiation therapy 02/10/21- 03/24/21 3. Adjuvant antiestrogen therapy with anastrozole (patient is postmenopausal) started 04/13/2021 and anti-HER-2 therapy with neratinib -------------------------------------------------------------------------------------------------------------------------------- Current treatment: Herceptin maintenance q 3 weeks with anastrozole, switched to letrozole 07/06/2021 Herceptin maintenance completed November 2022   Letrozole Toxicities: Fatigue Left shoulder arthroplasty 11/16/2022  05/23/2022: Progression of groundglass attenuation in the lungs possibly hypersensitivity pneumonitis, unchanged few small lung nodules stable borderline lymph nodes.   We discussed the role of neratinib and after much discussion we decided that it was not in her best interest to take neratinib.  Return to clinic in 1 year for follow-up

## 2022-11-26 ENCOUNTER — Encounter (HOSPITAL_COMMUNITY): Payer: Self-pay | Admitting: Occupational Therapy

## 2022-11-26 ENCOUNTER — Ambulatory Visit (HOSPITAL_COMMUNITY): Payer: Medicaid Other | Admitting: Occupational Therapy

## 2022-11-26 DIAGNOSIS — R29898 Other symptoms and signs involving the musculoskeletal system: Secondary | ICD-10-CM

## 2022-11-26 DIAGNOSIS — M25512 Pain in left shoulder: Secondary | ICD-10-CM | POA: Diagnosis not present

## 2022-11-26 DIAGNOSIS — M25612 Stiffness of left shoulder, not elsewhere classified: Secondary | ICD-10-CM

## 2022-11-26 NOTE — Therapy (Signed)
OUTPATIENT OCCUPATIONAL THERAPY ORTHO TREATMENT  Patient Name: Monica [name] MRN: [id number] DOB:08-25-1971, 51 y.o., female Today's Date: 11/26/2022  PCP: Royce Macadamia, PA-C REFERRING PROVIDER: Vanetta Mulders, MD  END OF SESSION:    Past Medical History:  Diagnosis Date   Asthma    Back pain    Breast cancer ([place])    Buttock pain    Family history of breast cancer 09/22/2020   Hypertension    Personal history of chemotherapy    Personal history of radiation therapy    Past Surgical History:  Procedure Laterality Date   ABDOMINAL HYSTERECTOMY     APPENDECTOMY     BREAST LUMPECTOMY WITH RADIOACTIVE SEED AND SENTINEL LYMPH NODE BIOPSY Right 09/10/2020   Procedure: RIGHT BREAST LUMPECTOMY WITH RADIOACTIVE SEED AND SENTINEL LYMPH NODE MAPPING;  Surgeon: Erroll Luna, MD;  Location: [facility];  Service: General;  Laterality: Right;   PORTACATH PLACEMENT Right 10/16/2020   Procedure: INSERTION PORT-A-CATH WITH ULTRASOUND GUIDANCE;  Surgeon: Erroll Luna, MD;  Location: [facility];  Service: General;  Laterality: Right;   RE-EXCISION OF BREAST LUMPECTOMY Right 10/16/2020   Procedure: RE-EXCISION OF RIGHT BREAST LUMPECTOMY;  Surgeon: Erroll Luna, MD;  Location: [facility];  Service: General;  Laterality: Right;   REVERSE SHOULDER ARTHROPLASTY Left 11/16/2022   Procedure: LEFT REVERSE SHOULDER ARTHROPLASTY;  Surgeon: Vanetta Mulders, MD;  Location: [facility];  Service: Orthopedics;  Laterality: Left;   Patient Active Problem List   Diagnosis Date Noted   Primary osteoarthritis, left shoulder 11/16/2022   Spondylolisthesis at L5-S1 level 01/04/2022   Lumbar radiculopathy 01/04/2022   Trochanteric bursitis of left hip 11/10/2021   Muscle cramp, nocturnal 11/10/2021   Port-A-Cath in place 12/29/2020   Genetic testing 10/08/2020   Family history of breast cancer 09/22/2020   Primary malignant  neoplasm of upper outer quadrant of female breast, right ([place]) 08/27/2020    ONSET DATE: 11/16/22  REFERRING DIAG: s/p left reverse TSA  THERAPY DIAG:  Acute pain of left shoulder  Stiffness of left shoulder, not elsewhere classified  Other symptoms and signs involving the musculoskeletal system  Rationale for Evaluation and Treatment: Rehabilitation  SUBJECTIVE:   SUBJECTIVE STATEMENT: S: " I took some pain medicine before I came" .  Pt accompanied by: self and family member  PERTINENT HISTORY: Pt is s/p left reverse TSA on 11/16/22. Pt reports she has been wearing the blue sling and has not straightened her arm since the surgery. She has high pain and is fearful of movement due to potential pain. Pt was referred to occupational therapy for evaluation and treatment by Dr. Vanetta Mulders.   PRECAUTIONS: Shoulder; waiting on protocol from MD  WEIGHT BEARING RESTRICTIONS: Yes NWB  PAIN:  Are you having pain? Yes: NPRS scale: 9/10 Pain location: left shoulder Pain description: sharp, toothache Aggravating factors: cold, rain Relieving factors: pain medication  FALLS: Has patient fallen in last 6 months? No  LIVING ENVIRONMENT: Lives with: lives alone Lives in: House/apartment Stairs: No Has following equipment at home: shower chair and Grab bars  PLOF: Independent  PATIENT GOALS: To be able to use the LUE as her non-dominant.   NEXT MD VISIT: 12/01/22  OBJECTIVE:   HAND DOMINANCE: Right  ADLs: Overall ADLs: Pt reports everything is difficult-dressing, putting her hair wrap on, cooking, sweeping, putting shoes on, bathing tasks. Pt with difficulty sleeping, is sleeping on her couch.    FUNCTIONAL OUTCOME MEASURES: Quick Dash: 97.73  UPPER EXTREMITY ROM:     Passive ROM Left eval  Shoulder flexion 43  Shoulder abduction 42  Shoulder internal rotation 59  Shoulder external rotation -34  (Blank rows = not tested)      Active ROM Left eval  Shoulder  flexion   Shoulder abduction   Shoulder internal rotation   Shoulder external rotation   (Blank rows = not tested)   UPPER EXTREMITY MMT:     MMT Left eval  Shoulder flexion   Shoulder abduction   Shoulder internal rotation   Shoulder external rotation   (Blank rows = not tested)  HAND FUNCTION: Grip strength: Right: 20 lbs; Left: 0 lbs  SENSATION: Pt reports tingling pins/needles in her left hand.   EDEMA: Intermittent  COGNITION: Overall cognitive status: Within functional limits for tasks assessed  OBSERVATIONS: pt with max fascial restrictions in left upper arm, anterior shoulder, trapezius, and scapular regions. Pt with max guarding and fear of pain with movement.    TODAY'S TREATMENT:                                                                                                                              DATE: N/A-eval only    11/26/2022  - Manual therapy: max guarding throughout, manual therapy to decrease fascial restriction throughout upper arm and posterior trap - P/ROM: shoulder flexion, IR/ER, abduction    PATIENT EDUCATION: Education details: Pendulums, elbow AA/ROM, wrist A/ROM Person educated: Patient and Parent Education method: Explanation, Demonstration, and Handouts Education comprehension: verbalized understanding and returned demonstration  HOME EXERCISE PROGRAM: Eval: Pendulums, elbow AA/ROM, wrist A/ROM  GOALS: Goals reviewed with patient? Yes  SHORT TERM GOALS: Target date: 12/23/22  Pt will be provided with and educated on HEP to improve ability to use LUE as assist during ADL tasks.   Goal status: INITIAL  2.  Pt will increase P/ROM of LUE to at least 50% ROM to improve ability to perform dressing tasks using compensatory strategies and minimial physical assistance.   Goal status: INITIAL  3.  Pt will increase LUE strength to 3-/5 to improve ability to use LUE to reach items at waist height during ADLs such as eating or  grooming.   Goal status: INITIAL  4.  Pt will decrease LUE fascial restrictions to mod amounts or less to improve ability to perform low level functional reaching tasks and improve tolerance to HEP and ROM tasks.   Goal status: INITIAL    LONG TERM GOALS: Target date: 01/23/23  Pt will decrease pain in LUE to 4/10 or less to improve ability to sleep in the bed for at least 2 hours at night.   Goal status: INITIAL  2.  Pt will increase A/ROM of LUE to at least 75% range to improve ability to reach up and operate her head wrap and reach back to adjust her pants.   Goal status: INITIAL  3.  Pt will decrease LUE fascial  restrictions to minimal amounts or less to improve ability to perform functional reaching tasks when getting items out of the cabinets during meal preparation or when getting items from a closet.  Goal status: INITIAL  4.  Pt will increase LUE strength to a 4/5 or greater to improve ability to lift pots and pans and to perform housekeeping tasks.   Goal status: INITIAL    ASSESSMENT:  CLINICAL IMPRESSION: Pt reports pain within upper/posterior trapezium. Discussed guarding with pt and to try and avoid this to decrease fascial restrictions and pain. Pt max guarding throughout manual therapy and P/ROM. Able to move shoulder into 50-60 degrees of flexion. Pt required mav VC for relaxation and breathing techniques throughout P/ROM, pt also tearful throughout movement. Pt stated that she has been completing HEP (pendulums) at home moving her wrist/elbow and digits.   PERFORMANCE DEFICITS: in functional skills including ADLs, IADLs, coordination, proprioception, edema, ROM, strength, pain, fascial restrictions, skin integrity, and UE functional use  IMPAIRMENTS: are limiting patient from ADLs, IADLs, rest and sleep, and leisure.   COMORBIDITIES: has no other co-morbidities that affects occupational performance. Patient will benefit from skilled OT to address above  impairments and improve overall function.  MODIFICATION OR ASSISTANCE TO COMPLETE EVALUATION: No modification of tasks or assist necessary to complete an evaluation.  OT OCCUPATIONAL PROFILE AND HISTORY: Problem focused assessment: Including review of records relating to presenting problem.  CLINICAL DECISION MAKING: LOW - limited treatment options, no task modification necessary  REHAB POTENTIAL: Good  EVALUATION COMPLEXITY: Low      PLAN:  OT FREQUENCY: 3x/week  OT DURATION: 8 weeks  PLANNED INTERVENTIONS: self care/ADL training, therapeutic exercise, therapeutic activity, manual therapy, passive range of motion, splinting, electrical stimulation, patient/family education, and Re-evaluation  CONSULTED AND AGREED WITH PLAN OF CARE: Patient  PLAN FOR NEXT SESSION: Follow up on HEP, begin with gentle myofascial release and passive stretching, continue to pendulums progressing to therapy ball stretches or table slides as pt is able to tolerate   Arvil Persons, OTR/L  [phone] 11/26/2022, 12:52 PM

## 2022-11-30 ENCOUNTER — Ambulatory Visit (HOSPITAL_COMMUNITY): Payer: Medicaid Other | Admitting: Occupational Therapy

## 2022-11-30 DIAGNOSIS — M25512 Pain in left shoulder: Secondary | ICD-10-CM

## 2022-11-30 DIAGNOSIS — R29898 Other symptoms and signs involving the musculoskeletal system: Secondary | ICD-10-CM

## 2022-11-30 DIAGNOSIS — M25612 Stiffness of left shoulder, not elsewhere classified: Secondary | ICD-10-CM

## 2022-11-30 NOTE — Therapy (Signed)
OUTPATIENT OCCUPATIONAL THERAPY ORTHO TREATMENT  Patient Name: Monica [name] MRN: [id number] DOB:1971-10-25, 51 y.o., female Today's Date: 11/30/2022  PCP: Royce Macadamia, PA-C REFERRING PROVIDER: Vanetta Mulders, MD  END OF SESSION:    Past Medical History:  Diagnosis Date   Asthma    Back pain    Breast cancer ([place])    Buttock pain    Family history of breast cancer 09/22/2020   Hypertension    Personal history of chemotherapy    Personal history of radiation therapy    Past Surgical History:  Procedure Laterality Date   ABDOMINAL HYSTERECTOMY     APPENDECTOMY     BREAST LUMPECTOMY WITH RADIOACTIVE SEED AND SENTINEL LYMPH NODE BIOPSY Right 09/10/2020   Procedure: RIGHT BREAST LUMPECTOMY WITH RADIOACTIVE SEED AND SENTINEL LYMPH NODE MAPPING;  Surgeon: Erroll Luna, MD;  Location: [facility];  Service: General;  Laterality: Right;   PORTACATH PLACEMENT Right 10/16/2020   Procedure: INSERTION PORT-A-CATH WITH ULTRASOUND GUIDANCE;  Surgeon: Erroll Luna, MD;  Location: [facility];  Service: General;  Laterality: Right;   RE-EXCISION OF BREAST LUMPECTOMY Right 10/16/2020   Procedure: RE-EXCISION OF RIGHT BREAST LUMPECTOMY;  Surgeon: Erroll Luna, MD;  Location: [facility];  Service: General;  Laterality: Right;   REVERSE SHOULDER ARTHROPLASTY Left 11/16/2022   Procedure: LEFT REVERSE SHOULDER ARTHROPLASTY;  Surgeon: Vanetta Mulders, MD;  Location: [facility];  Service: Orthopedics;  Laterality: Left;   Patient Active Problem List   Diagnosis Date Noted   Primary osteoarthritis, left shoulder 11/16/2022   Spondylolisthesis at L5-S1 level 01/04/2022   Lumbar radiculopathy 01/04/2022   Trochanteric bursitis of left hip 11/10/2021   Muscle cramp, nocturnal 11/10/2021   Port-A-Cath in place 12/29/2020   Genetic testing 10/08/2020   Family history of breast cancer 09/22/2020   Primary malignant  neoplasm of upper outer quadrant of female breast, right ([place]) 08/27/2020    ONSET DATE: 11/16/22  REFERRING DIAG: s/p left reverse TSA  THERAPY DIAG:  No diagnosis found.  Rationale for Evaluation and Treatment: Rehabilitation  SUBJECTIVE:   SUBJECTIVE STATEMENT: S: " I took some pain medicine before I came" .  Pt accompanied by: self and family member  PERTINENT HISTORY: Pt is s/p left reverse TSA on 11/16/22. Pt reports she has been wearing the blue sling and has not straightened her arm since the surgery. She has high pain and is fearful of movement due to potential pain. Pt was referred to occupational therapy for evaluation and treatment by Dr. Vanetta Mulders.   PRECAUTIONS: Shoulder; waiting on protocol from MD  WEIGHT BEARING RESTRICTIONS: Yes NWB  PAIN:  Are you having pain? Yes: NPRS scale: 9/10 Pain location: left shoulder Pain description: sharp, toothache Aggravating factors: cold, rain Relieving factors: pain medication  FALLS: Has patient fallen in last 6 months? No  LIVING ENVIRONMENT: Lives with: lives alone Lives in: House/apartment Stairs: No Has following equipment at home: shower chair and Grab bars  PLOF: Independent  PATIENT GOALS: To be able to use the LUE as her non-dominant.   NEXT MD VISIT: 12/01/22  OBJECTIVE:   HAND DOMINANCE: Right  ADLs: Overall ADLs: Pt reports everything is difficult-dressing, putting her hair wrap on, cooking, sweeping, putting shoes on, bathing tasks. Pt with difficulty sleeping, is sleeping on her couch.    FUNCTIONAL OUTCOME MEASURES: Quick Dash: 97.73   UPPER EXTREMITY ROM:     Passive ROM Left eval  Shoulder flexion 43  Shoulder abduction 42  Shoulder internal rotation 59  Shoulder external rotation -34  (Blank rows = not tested)      Active ROM Left eval  Shoulder flexion   Shoulder abduction   Shoulder internal rotation   Shoulder external rotation   (Blank rows = not  tested)   UPPER EXTREMITY MMT:     MMT Left eval  Shoulder flexion   Shoulder abduction   Shoulder internal rotation   Shoulder external rotation   (Blank rows = not tested)  HAND FUNCTION: Grip strength: Right: 20 lbs; Left: 0 lbs  SENSATION: Pt reports tingling pins/needles in her left hand.   EDEMA: Intermittent  COGNITION: Overall cognitive status: Within functional limits for tasks assessed  OBSERVATIONS: pt with max fascial restrictions in left upper arm, anterior shoulder, trapezius, and scapular regions. Pt with max guarding and fear of pain with movement.    TODAY'S TREATMENT:                                                                                                                              DATE: N/A-eval only    11/26/2022  - Manual therapy: max guarding throughout, manual therapy to decrease fascial restriction throughout upper arm and posterior trap - P/ROM: shoulder flexion, IR/ER, abduction    PATIENT EDUCATION: Education details: Pendulums, elbow AA/ROM, wrist A/ROM Person educated: Patient and Parent Education method: Explanation, Demonstration, and Handouts Education comprehension: verbalized understanding and returned demonstration  HOME EXERCISE PROGRAM: Eval: Pendulums, elbow AA/ROM, wrist A/ROM  GOALS: Goals reviewed with patient? Yes  SHORT TERM GOALS: Target date: 12/23/22  Pt will be provided with and educated on HEP to improve ability to use LUE as assist during ADL tasks.   Goal status: INITIAL  2.  Pt will increase P/ROM of LUE to at least 50% ROM to improve ability to perform dressing tasks using compensatory strategies and minimial physical assistance.   Goal status: INITIAL  3.  Pt will increase LUE strength to 3-/5 to improve ability to use LUE to reach items at waist height during ADLs such as eating or grooming.   Goal status: INITIAL  4.  Pt will decrease LUE fascial restrictions to mod amounts or less to improve  ability to perform low level functional reaching tasks and improve tolerance to HEP and ROM tasks.   Goal status: INITIAL    LONG TERM GOALS: Target date: 01/23/23  Pt will decrease pain in LUE to 4/10 or less to improve ability to sleep in the bed for at least 2 hours at night.   Goal status: INITIAL  2.  Pt will increase A/ROM of LUE to at least 75% range to improve ability to reach up and operate her head wrap and reach back to adjust her pants.   Goal status: INITIAL  3.  Pt will decrease LUE fascial restrictions to minimal amounts or less to improve ability to perform functional reaching tasks when getting items out of the  cabinets during meal preparation or when getting items from a closet.  Goal status: INITIAL  4.  Pt will increase LUE strength to a 4/5 or greater to improve ability to lift pots and pans and to perform housekeeping tasks.   Goal status: INITIAL    ASSESSMENT:  CLINICAL IMPRESSION: Pt reports pain within upper/posterior trapezium. Discussed guarding with pt and to try and avoid this to decrease fascial restrictions and pain. Pt max guarding throughout manual therapy and P/ROM. Able to move shoulder into 50-60 degrees of flexion. Pt required mav VC for relaxation and breathing techniques throughout P/ROM, pt also tearful throughout movement. Pt stated that she has been completing HEP (pendulums) at home moving her wrist/elbow and digits.   PERFORMANCE DEFICITS: in functional skills including ADLs, IADLs, coordination, proprioception, edema, ROM, strength, pain, fascial restrictions, skin integrity, and UE functional use  IMPAIRMENTS: are limiting patient from ADLs, IADLs, rest and sleep, and leisure.   COMORBIDITIES: has no other co-morbidities that affects occupational performance. Patient will benefit from skilled OT to address above impairments and improve overall function.  MODIFICATION OR ASSISTANCE TO COMPLETE EVALUATION: No modification of tasks or  assist necessary to complete an evaluation.  OT OCCUPATIONAL PROFILE AND HISTORY: Problem focused assessment: Including review of records relating to presenting problem.  CLINICAL DECISION MAKING: LOW - limited treatment options, no task modification necessary  REHAB POTENTIAL: Good  EVALUATION COMPLEXITY: Low      PLAN:  OT FREQUENCY: 3x/week  OT DURATION: 8 weeks  PLANNED INTERVENTIONS: self care/ADL training, therapeutic exercise, therapeutic activity, manual therapy, passive range of motion, splinting, electrical stimulation, patient/family education, and Re-evaluation  CONSULTED AND AGREED WITH PLAN OF CARE: Patient  PLAN FOR NEXT SESSION: Follow up on HEP, begin with gentle myofascial release and passive stretching, continue to pendulums progressing to therapy ball stretches or table slides as pt is able to tolerate   Arvil Persons, OTR/L  [phone] 11/30/2022, 10:34 AM

## 2022-11-30 NOTE — Patient Instructions (Signed)

## 2022-12-01 ENCOUNTER — Ambulatory Visit (INDEPENDENT_AMBULATORY_CARE_PROVIDER_SITE_OTHER): Payer: Medicaid Other

## 2022-12-01 ENCOUNTER — Other Ambulatory Visit (HOSPITAL_BASED_OUTPATIENT_CLINIC_OR_DEPARTMENT_OTHER): Payer: Self-pay

## 2022-12-01 ENCOUNTER — Telehealth: Payer: Self-pay | Admitting: Orthopaedic Surgery

## 2022-12-01 ENCOUNTER — Ambulatory Visit (INDEPENDENT_AMBULATORY_CARE_PROVIDER_SITE_OTHER): Payer: Medicaid Other | Admitting: Orthopaedic Surgery

## 2022-12-01 DIAGNOSIS — M19012 Primary osteoarthritis, left shoulder: Secondary | ICD-10-CM

## 2022-12-01 MED ORDER — HYDROCODONE-ACETAMINOPHEN 5-325 MG PO TABS
1.0000 | ORAL_TABLET | Freq: Four times a day (QID) | ORAL | 0 refills | Status: DC | PRN
  Filled 2022-12-01: qty 20, 5d supply, fill #0

## 2022-12-01 NOTE — Telephone Encounter (Signed)
Per brittany with [facility], patient was denied HHPT due to age and mobility status. RC to patient and informed her to continue with outpatient PT. No further questions asked

## 2022-12-01 NOTE — Progress Notes (Signed)
Post Operative Evaluation    Procedure/Date of Surgery: Left reverse shoulder arthroplasty 12/12  Interval History:    Presents today 2 weeks status post left reverse shoulder arthroplasty doing well.  She is complaining of some numbness about the lateral forearm although nowhere else.  She is here today for further assessment.  Overall she states she is not able to take oxycodone as this is making her itchy.   PMH/PSH/Family History/Social History/Meds/Allergies:    Past Medical History:  Diagnosis Date   Asthma    Back pain    Breast cancer ([place])    Buttock pain    Family history of breast cancer 09/22/2020   Hypertension    Personal history of chemotherapy    Personal history of radiation therapy    Past Surgical History:  Procedure Laterality Date   ABDOMINAL HYSTERECTOMY     APPENDECTOMY     BREAST LUMPECTOMY WITH RADIOACTIVE SEED AND SENTINEL LYMPH NODE BIOPSY Right 09/10/2020   Procedure: RIGHT BREAST LUMPECTOMY WITH RADIOACTIVE SEED AND SENTINEL LYMPH NODE MAPPING;  Surgeon: Erroll Luna, MD;  Location: [facility];  Service: General;  Laterality: Right;   PORTACATH PLACEMENT Right 10/16/2020   Procedure: INSERTION PORT-A-CATH WITH ULTRASOUND GUIDANCE;  Surgeon: Erroll Luna, MD;  Location: [facility];  Service: General;  Laterality: Right;   RE-EXCISION OF BREAST LUMPECTOMY Right 10/16/2020   Procedure: RE-EXCISION OF RIGHT BREAST LUMPECTOMY;  Surgeon: Erroll Luna, MD;  Location: [facility];  Service: General;  Laterality: Right;   REVERSE SHOULDER ARTHROPLASTY Left 11/16/2022   Procedure: LEFT REVERSE SHOULDER ARTHROPLASTY;  Surgeon: Vanetta Mulders, MD;  Location: [facility];  Service: Orthopedics;  Laterality: Left;   Social History   Socioeconomic History   Marital status: Single    Spouse name: Not on file   Number of children: Not on file   Years of  education: Not on file   Highest education level: Not on file  Occupational History   Not on file  Tobacco Use   Smoking status: Former    Packs/day: 0.50    Years: 35.00    Total pack years: 17.50    Types: Cigarettes    Quit date: 08/22/2020    Years since quitting: 2.2   Smokeless tobacco: Never  Vaping Use   Vaping Use: Never used  Substance and Sexual Activity   Alcohol use: No   Drug use: Never   Sexual activity: Not Currently    Birth control/protection: Surgical  Other Topics Concern   Not on file  Social History Narrative   Not on file   Social Determinants of Health   Financial Resource Strain: Not on file  Food Insecurity: Not on file  Transportation Needs: Not on file  Physical Activity: Not on file  Stress: Not on file  Social Connections: Not on file   Family History  Problem Relation Age of Onset   Hypertension Father    Hypertension Sister    Breast cancer Maternal Aunt 47   Breast cancer Paternal Aunt        dx at unknown age   Hypertension Maternal Grandmother    Breast cancer Other        MGM's niece; dx 46s   Breast cancer Other  MGF's niece   No Known Allergies Current Outpatient Medications  Medication Sig Dispense Refill   ADVAIR HFA 115-21 MCG/ACT inhaler Inhale 2 puffs into the lungs 2 (two) times daily.     albuterol (VENTOLIN HFA) 108 (90 Base) MCG/ACT inhaler Inhale 2 puffs into the lungs every 4 (four) hours as needed for wheezing or shortness of breath. 8 g 3   aspirin EC 325 MG tablet Take 1 tablet (325 mg total) by mouth daily. 30 tablet 0   baclofen (LIORESAL) 10 MG tablet Take 1 tablet (10 mg total) by mouth at bedtime as needed for muscle spasms. 90 each 1   budesonide-formoterol (SYMBICORT) 160-4.5 MCG/ACT inhaler Inhale 2 puffs into the lungs in the morning and at bedtime. 10.2 g 5   calcium citrate (CALCITRATE - DOSED IN MG ELEMENTAL CALCIUM) 950 (200 Ca) MG tablet Take 200 mg of elemental calcium by mouth daily.      Cholecalciferol (VITAMIN D3) 125 MCG (5000 UT) TABS Take 1 tablet by mouth daily.     clonazePAM (KLONOPIN) 0.5 MG tablet Take 0.5 mg by mouth daily as needed.     escitalopram (LEXAPRO) 10 MG tablet Take 10 mg by mouth daily.     gabapentin (NEURONTIN) 300 MG capsule Take 1 capsule (300 mg total) by mouth 3 (three) times daily. 90 capsule 2   HYDROcodone-acetaminophen (NORCO/VICODIN) 5-325 MG tablet Take 1 tablet by mouth every 6 (six) hours as needed. 15 tablet 0   letrozole (FEMARA) 2.5 MG tablet TAKE 1 TABLET ONCE DAILY. 30 tablet 0   lisinopril (ZESTRIL) 10 MG tablet Take 1 tablet (10 mg total) by mouth daily.     oxyCODONE (OXY IR/ROXICODONE) 5 MG immediate release tablet Take 1 tablet (5 mg total) by mouth every 4 (four) hours as needed (severe pain). 20 tablet 0   prazosin (MINIPRESS) 1 MG capsule Take 1 mg by mouth at bedtime.     predniSONE (DELTASONE) 10 MG tablet Take by mouth.     No current facility-administered medications for this visit.   No results found.  Review of Systems:   A ROS was performed including pertinent positives and negatives as documented in the HPI.   Musculoskeletal Exam:    There were no vitals taken for this visit.  Left incision is well-appearing without erythema or drainage.  Passive range of motion in the spine position is to 90 degrees although there is significant guarding.  External rotation at the side is to 20 again with guarding.  Internal rotation deferred.  There is some numbness in the musculocutaneous distribution although she is able to fire all 3 heads of the deltoid.  Imaging:      I personally reviewed and interpreted the radiographs.   Assessment:   51 year old female who is 2 weeks status post left reverse shoulder arthroplasty doing well.  At this time she will continue to progress according to my rehab protocol for reverse shoulder.  All limitations and restrictions were discussed.  She will return to clinic 4 weeks.   Prescription for hydrocodone prescribed  Plan :    -Return to clinic in 4 weeks no weight like this like      I personally saw and evaluated the patient, and participated in the management and treatment plan.  Vanetta Mulders, MD Attending Physician, Orthopedic Surgery  This document was dictated using Dragon voice recognition software. A reasonable attempt at proof reading has been made to minimize errors.

## 2022-12-01 NOTE — Telephone Encounter (Signed)
Patient has called [facility] and wants to know why OT and PT has not been out to her home or has not been ordered for her? Best contact number [phone]

## 2022-12-02 ENCOUNTER — Encounter (HOSPITAL_COMMUNITY): Payer: Self-pay | Admitting: Occupational Therapy

## 2022-12-03 ENCOUNTER — Ambulatory Visit (HOSPITAL_COMMUNITY): Payer: Medicaid Other | Admitting: Occupational Therapy

## 2022-12-03 ENCOUNTER — Encounter (HOSPITAL_COMMUNITY): Payer: Self-pay | Admitting: Occupational Therapy

## 2022-12-03 DIAGNOSIS — R29898 Other symptoms and signs involving the musculoskeletal system: Secondary | ICD-10-CM

## 2022-12-03 DIAGNOSIS — M25512 Pain in left shoulder: Secondary | ICD-10-CM | POA: Diagnosis not present

## 2022-12-03 DIAGNOSIS — M25612 Stiffness of left shoulder, not elsewhere classified: Secondary | ICD-10-CM

## 2022-12-03 NOTE — Therapy (Signed)
OUTPATIENT OCCUPATIONAL THERAPY ORTHO TREATMENT  Patient Name: Monica [name] MRN: [id number] DOB:01-04-1971, 51 y.o., female Today's Date: 12/03/2022  PCP: Royce Macadamia, PA-C REFERRING PROVIDER: Vanetta Mulders, MD  END OF SESSION:  OT End of Session - 12/03/22 1111     Visit Number 4    Number of Visits 20    Date for OT Re-Evaluation 01/22/23    Authorization Type [employer]    Authorization Time Period Requesting 3 visits for 2023    Authorization - Visit Number 3    Authorization - Number of Visits 3    OT Start Time 1030    OT Stop Time 1115    OT Time Calculation (min) 45 min    Activity Tolerance Patient tolerated treatment well    Behavior During Therapy [facility] for tasks assessed/performed             Past Medical History:  Diagnosis Date   Asthma    Back pain    Breast cancer ([place])    Buttock pain    Family history of breast cancer 09/22/2020   Hypertension    Personal history of chemotherapy    Personal history of radiation therapy    Past Surgical History:  Procedure Laterality Date   ABDOMINAL HYSTERECTOMY     APPENDECTOMY     BREAST LUMPECTOMY WITH RADIOACTIVE SEED AND SENTINEL LYMPH NODE BIOPSY Right 09/10/2020   Procedure: RIGHT BREAST LUMPECTOMY WITH RADIOACTIVE SEED AND SENTINEL [facility];  Surgeon: Erroll Luna, MD;  Location: [place];  Service: General;  Laterality: Right;   PORTACATH PLACEMENT Right 10/16/2020   Procedure: INSERTION PORT-A-CATH WITH ULTRASOUND GUIDANCE;  Surgeon: Erroll Luna, MD;  Location: [place];  Service: General;  Laterality: Right;   RE-EXCISION OF BREAST LUMPECTOMY Right 10/16/2020   Procedure: RE-EXCISION OF RIGHT BREAST LUMPECTOMY;  Surgeon: Erroll Luna, MD;  Location: [place];  Service: General;  Laterality: Right;   REVERSE SHOULDER ARTHROPLASTY Left 11/16/2022   Procedure: LEFT REVERSE SHOULDER ARTHROPLASTY;  Surgeon: Vanetta Mulders, MD;  Location: [place];  Service: Orthopedics;  Laterality: Left;   Patient Active Problem List   Diagnosis Date Noted   Primary osteoarthritis, left shoulder 11/16/2022   Spondylolisthesis at L5-S1 level 01/04/2022   Lumbar radiculopathy 01/04/2022   Trochanteric bursitis of left hip 11/10/2021   Muscle cramp, nocturnal 11/10/2021   Port-A-Cath in place 12/29/2020   Genetic testing 10/08/2020   Family history of breast cancer 09/22/2020   Primary malignant neoplasm of upper outer quadrant of female breast, right ([place]) 08/27/2020    ONSET DATE: 11/16/22  REFERRING DIAG: s/p left reverse TSA  THERAPY DIAG:  Acute pain of left shoulder  Stiffness of left shoulder, not elsewhere classified  Other symptoms and signs involving the musculoskeletal system  Rationale for Evaluation and Treatment: Rehabilitation  SUBJECTIVE:   SUBJECTIVE STATEMENT: S: "I'm getting so frustrated with not being able to move my arm" .   PERTINENT HISTORY: Pt is s/p left reverse TSA on 11/16/22. Pt reports she has been wearing the blue sling and has not straightened her arm since the surgery. She has high pain and is fearful of movement due to potential pain. Pt was referred to occupational therapy for evaluation and treatment by Dr. Vanetta Mulders.   PRECAUTIONS: Shoulder; waiting on protocol from MD  WEIGHT BEARING RESTRICTIONS: Yes NWB  PAIN:  Are you having pain? Yes: NPRS scale: 9/10 Pain location: left shoulder Pain description:  sharp, toothache Aggravating factors: cold, rain Relieving factors: pain medication  FALLS: Has patient fallen in last 6 months? No  LIVING ENVIRONMENT: Lives with: lives alone Lives in: House/apartment Stairs: No Has following equipment at home: shower chair and Grab bars  PLOF: Independent  PATIENT GOALS: To be able to use the LUE as her non-dominant.   NEXT MD VISIT: 12/01/22  OBJECTIVE:   HAND DOMINANCE:  Right  ADLs: Overall ADLs: Pt reports everything is difficult-dressing, putting her hair wrap on, cooking, sweeping, putting shoes on, bathing tasks. Pt with difficulty sleeping, is sleeping on her couch.    FUNCTIONAL OUTCOME MEASURES: Quick Dash: 97.73   UPPER EXTREMITY ROM:     Passive ROM Left eval  Shoulder flexion 43  Shoulder abduction 42  Shoulder internal rotation 59  Shoulder external rotation -34  (Blank rows = not tested)      Active ROM Left eval  Shoulder flexion   Shoulder abduction   Shoulder internal rotation   Shoulder external rotation   (Blank rows = not tested)   UPPER EXTREMITY MMT:     MMT Left eval  Shoulder flexion   Shoulder abduction   Shoulder internal rotation   Shoulder external rotation   (Blank rows = not tested)  HAND FUNCTION: Grip strength: Right: 20 lbs; Left: 0 lbs  SENSATION: Pt reports tingling pins/needles in her left hand.   EDEMA: Intermittent  COGNITION: Overall cognitive status: Within functional limits for tasks assessed  OBSERVATIONS: pt with max fascial restrictions in left upper arm, anterior shoulder, trapezius, and scapular regions. Pt with max guarding and fear of pain with movement.    TODAY'S TREATMENT:                                                                                                                              DATE:   12/03/22 -Manual therapy: max guarding throughout, manual therapy to decrease fascial restriction throughout upper arm and posterior trap -P/ROM: supine, flexion, abduction, horizontal abduction, er/IR, x10 -Ball Rolls: flexion and abduction, x10  11/30/22 -Pendulums, x60" -Manual therapy: max guarding throughout, manual therapy to decrease fascial restriction throughout upper arm and posterior trap -P/ROM: supine, flexion, abduction, horizontal abduction, er/IR, x10 -AA/ROM: supine, flexion, abduction, protraction, horizontal abduction, er/IR, x8 -Table slides:  flexion, x5  11/26/2022  - Manual therapy: max guarding throughout, manual therapy to decrease fascial restriction throughout upper arm and posterior trap - P/ROM: shoulder flexion, IR/ER, abduction    PATIENT EDUCATION: Education details: Willeen Niece Person educated: Patient and Parent Education method: Explanation, Demonstration, and Handouts Education comprehension: verbalized understanding and returned demonstration  HOME EXERCISE PROGRAM: Eval: Pendulums, elbow AA/ROM, wrist A/ROM 12/26: Table Slides  GOALS: Goals reviewed with patient? Yes  SHORT TERM GOALS: Target date: 12/23/22  Pt will be provided with and educated on HEP to improve ability to use LUE as assist during ADL tasks.   Goal status: IN PROGRESS  2.  Pt  will increase P/ROM of LUE to at least 50% ROM to improve ability to perform dressing tasks using compensatory strategies and minimial physical assistance.   Goal status: IN PROGRESS  3.  Pt will increase LUE strength to 3-/5 to improve ability to use LUE to reach items at waist height during ADLs such as eating or grooming.   Goal status: IN PROGRESS  4.  Pt will decrease LUE fascial restrictions to mod amounts or less to improve ability to perform low level functional reaching tasks and improve tolerance to HEP and ROM tasks.   Goal status: IN PROGRESS    LONG TERM GOALS: Target date: 01/23/23  Pt will decrease pain in LUE to 4/10 or less to improve ability to sleep in the bed for at least 2 hours at night.   Goal status: IN PROGRESS  2.  Pt will increase A/ROM of LUE to at least 75% range to improve ability to reach up and operate her head wrap and reach back to adjust her pants.   Goal status: IN PROGRESS  3.  Pt will decrease LUE fascial restrictions to minimal amounts or less to improve ability to perform functional reaching tasks when getting items out of the cabinets during meal preparation or when getting items from a closet.  Goal status:  IN PROGRESS  4.  Pt will increase LUE strength to a 4/5 or greater to improve ability to lift pots and pans and to perform housekeeping tasks.   Goal status: IN PROGRESS    ASSESSMENT:  CLINICAL IMPRESSION: Pt continuing to be extremely guarded due to pain. This session focused further on relaxation breathing. With P/ROM this session pt was working on breathing techniques to be able to increase ROM. She required increased time during manual therapy due to pain and severe fascial restrictions. Therapist providing max verbal cuing for breathing techniques to assist with increased ROM and relaxation.   PLAN:  OT FREQUENCY: 3x/week  OT DURATION: 8 weeks  PLANNED INTERVENTIONS: self care/ADL training, therapeutic exercise, therapeutic activity, manual therapy, passive range of motion, splinting, electrical stimulation, patient/family education, and Re-evaluation  CONSULTED AND AGREED WITH PLAN OF CARE: Patient  PLAN FOR NEXT SESSION: Follow up on HEP, begin with gentle myofascial release and passive stretching, continue to pendulums progressing to therapy ball stretches or table slides as pt is able to tolerate, AA/ROM   Paulita Fujita, OTR/L [phone] 12/03/2022, 11:15 AM

## 2022-12-07 ENCOUNTER — Encounter (HOSPITAL_COMMUNITY): Payer: Self-pay | Admitting: Occupational Therapy

## 2022-12-07 ENCOUNTER — Ambulatory Visit (HOSPITAL_COMMUNITY): Payer: Medicaid Other | Attending: *Deleted | Admitting: Occupational Therapy

## 2022-12-07 DIAGNOSIS — M25612 Stiffness of left shoulder, not elsewhere classified: Secondary | ICD-10-CM | POA: Diagnosis present

## 2022-12-07 DIAGNOSIS — R29898 Other symptoms and signs involving the musculoskeletal system: Secondary | ICD-10-CM | POA: Diagnosis present

## 2022-12-07 DIAGNOSIS — M25512 Pain in left shoulder: Secondary | ICD-10-CM

## 2022-12-07 NOTE — Therapy (Signed)
OUTPATIENT OCCUPATIONAL THERAPY ORTHO TREATMENT  Patient Name: Monica [name] MRN: [id number] DOB:June 19, 1971, 52 y.o., female Today's Date: 12/07/2022  PCP: Royce Macadamia, PA-C REFERRING PROVIDER: Vanetta Mulders, MD  END OF SESSION:  OT End of Session - 12/07/22 0841     Visit Number 5    Number of Visits 20    Date for OT Re-Evaluation 01/22/23    Authorization Type [employer]    Authorization Time Period Requesting 3 visits for 2023    Authorization - Number of Visits 3    OT Start Time 0900    OT Stop Time 0940    OT Time Calculation (min) 40 min    Activity Tolerance Patient tolerated treatment well    Behavior During Therapy [facility] for tasks assessed/performed             Past Medical History:  Diagnosis Date   Asthma    Back pain    Breast cancer ([place])    Buttock pain    Family history of breast cancer 09/22/2020   Hypertension    Personal history of chemotherapy    Personal history of radiation therapy    Past Surgical History:  Procedure Laterality Date   ABDOMINAL HYSTERECTOMY     APPENDECTOMY     BREAST LUMPECTOMY WITH RADIOACTIVE SEED AND SENTINEL LYMPH NODE BIOPSY Right 09/10/2020   Procedure: RIGHT BREAST LUMPECTOMY WITH RADIOACTIVE SEED AND SENTINEL [place];  Surgeon: Erroll Luna, MD;  Location: [place];  Service: General;  Laterality: Right;   PORTACATH PLACEMENT Right 10/16/2020   Procedure: INSERTION PORT-A-CATH WITH ULTRASOUND GUIDANCE;  Surgeon: Erroll Luna, MD;  Location: [place];  Service: General;  Laterality: Right;   RE-EXCISION OF BREAST LUMPECTOMY Right 10/16/2020   Procedure: RE-EXCISION OF RIGHT BREAST LUMPECTOMY;  Surgeon: Erroll Luna, MD;  Location: [place] ;  Service: General;  Laterality: Right;   REVERSE SHOULDER ARTHROPLASTY Left 11/16/2022   Procedure: LEFT REVERSE SHOULDER ARTHROPLASTY;  Surgeon: Vanetta Mulders, MD;  Location: [place];  Service: Orthopedics;  Laterality: Left;   Patient Active Problem List   Diagnosis Date Noted   Primary osteoarthritis, left shoulder 11/16/2022   Spondylolisthesis at L5-S1 level 01/04/2022   Lumbar radiculopathy 01/04/2022   Trochanteric bursitis of left hip 11/10/2021   Muscle cramp, nocturnal 11/10/2021   Port-A-Cath in place 12/29/2020   Genetic testing 10/08/2020   Family history of breast cancer 09/22/2020   Primary malignant neoplasm of upper outer quadrant of female breast, right ([place]) 08/27/2020    ONSET DATE: 11/16/22  REFERRING DIAG: s/p left reverse TSA  THERAPY DIAG:  Acute pain of left shoulder  Stiffness of left shoulder, not elsewhere classified  Other symptoms and signs involving the musculoskeletal system  Rationale for Evaluation and Treatment: Rehabilitation  SUBJECTIVE:   SUBJECTIVE STATEMENT: S: "My arm feels so heavy ".   PERTINENT HISTORY: Pt is s/p left reverse TSA on 11/16/22. Pt reports she has been wearing the blue sling and has not straightened her arm since the surgery. She has high pain and is fearful of movement due to potential pain. Pt was referred to occupational therapy for evaluation and treatment by Dr. Vanetta Mulders.   PRECAUTIONS: Shoulder; waiting on protocol from MD  WEIGHT BEARING RESTRICTIONS: Yes NWB  PAIN:  Are you having pain? Yes: NPRS scale: 7/10 Pain location: left shoulder Pain description: sharp, toothache Aggravating factors: cold, rain Relieving factors: pain medication  FALLS: Has patient fallen  in last 6 months? No  PATIENT GOALS: To be able to use the LUE as her non-dominant.   NEXT MD VISIT: 12/01/22  OBJECTIVE:   HAND DOMINANCE: Right  ADLs: Overall ADLs: Pt reports everything is difficult-dressing, putting her hair wrap on, cooking, sweeping, putting shoes on, bathing tasks. Pt with difficulty sleeping, is sleeping on her couch.    FUNCTIONAL OUTCOME MEASURES: Quick Dash:  97.73   UPPER EXTREMITY ROM:     Passive ROM Left eval  Shoulder flexion 43  Shoulder abduction 42  Shoulder internal rotation 59  Shoulder external rotation -34  (Blank rows = not tested)      Active ROM Left eval  Shoulder flexion   Shoulder abduction   Shoulder internal rotation   Shoulder external rotation   (Blank rows = not tested)   UPPER EXTREMITY MMT:     MMT Left eval  Shoulder flexion   Shoulder abduction   Shoulder internal rotation   Shoulder external rotation   (Blank rows = not tested)  HAND FUNCTION: Grip strength: Right: 20 lbs; Left: 0 lbs  SENSATION: Pt reports tingling pins/needles in her left hand.   EDEMA: Intermittent  COGNITION: Overall cognitive status: Within functional limits for tasks assessed  OBSERVATIONS: pt with max fascial restrictions in left upper arm, anterior shoulder, trapezius, and scapular regions. Pt with max guarding and fear of pain with movement.    TODAY'S TREATMENT:                                                                                                                              DATE:   12/07/22 -Pendulums, x60" -Manual therapy: myofascial release and trigger point applied to LUE at the pectoralis, biceps, trapezius, and scapular region in order to decrease fascial restriction throughout upper arm and posterior trap -P/ROM: supine, flexion, abduction, horizontal abduction, er/IR, x8 -AA/ROM: supine, flexion, protraction, horizontal abduction, er/IR, x10  12/03/22 -Manual therapy: max guarding throughout, manual therapy to decrease fascial restriction throughout upper arm and posterior trap -P/ROM: supine, flexion, abduction, horizontal abduction, er/IR, x10 -Ball Rolls: flexion and abduction, x10  11/30/22 -Pendulums, x60" -Manual therapy: max guarding throughout, manual therapy to decrease fascial restriction throughout upper arm and posterior trap -P/ROM: supine, flexion, abduction, horizontal  abduction, er/IR, x10 -Table slides: flexion, x5   PATIENT EDUCATION: Education details: AA/ROM Person educated: Patient and Parent Education method: Consulting civil engineer, Media planner, and Handouts Education comprehension: verbalized understanding and returned demonstration  HOME EXERCISE PROGRAM: Eval: Pendulums, elbow AA/ROM, wrist A/ROM 12/26: Table Slides 12/07/22: AA/ROM  GOALS: Goals reviewed with patient? Yes  SHORT TERM GOALS: Target date: 12/23/22  Pt will be provided with and educated on HEP to improve ability to use LUE as assist during ADL tasks.   Goal status: IN PROGRESS  2.  Pt will increase P/ROM of LUE to at least 50% ROM to improve ability to perform dressing tasks using compensatory strategies and minimial physical assistance.  Goal status: IN PROGRESS  3.  Pt will increase LUE strength to 3-/5 to improve ability to use LUE to reach items at waist height during ADLs such as eating or grooming.   Goal status: IN PROGRESS  4.  Pt will decrease LUE fascial restrictions to mod amounts or less to improve ability to perform low level functional reaching tasks and improve tolerance to HEP and ROM tasks.   Goal status: IN PROGRESS    LONG TERM GOALS: Target date: 01/23/23  Pt will decrease pain in LUE to 4/10 or less to improve ability to sleep in the bed for at least 2 hours at night.   Goal status: IN PROGRESS  2.  Pt will increase A/ROM of LUE to at least 75% range to improve ability to reach up and operate her head wrap and reach back to adjust her pants.   Goal status: IN PROGRESS  3.  Pt will decrease LUE fascial restrictions to minimal amounts or less to improve ability to perform functional reaching tasks when getting items out of the cabinets during meal preparation or when getting items from a closet.  Goal status: IN PROGRESS  4.  Pt will increase LUE strength to a 4/5 or greater to improve ability to lift pots and pans and to perform housekeeping  tasks.   Goal status: IN PROGRESS    ASSESSMENT:  CLINICAL IMPRESSION: This session, pt presents with decreased pain and fear/guarding. She is able to tolerate manual therapy to decrease fascial restrictions with no rest break. With P/ROM pt was having increased pain and guard during flexion, however with AA/ROM she was able to achieve increased range in flexion but was unable to tolerate abduction due to pain in the pectoralis and biceps.    PLAN:  OT FREQUENCY: 3x/week  OT DURATION: 8 weeks  PLANNED INTERVENTIONS: self care/ADL training, therapeutic exercise, therapeutic activity, manual therapy, passive range of motion, splinting, electrical stimulation, patient/family education, and Re-evaluation  CONSULTED AND AGREED WITH PLAN OF CARE: Patient  PLAN FOR NEXT SESSION: Follow up on HEP, begin with gentle myofascial release and passive stretching, continue to pendulums progressing to therapy ball stretches or table slides as pt is able to tolerate, AA/ROM   Paulita Fujita, OTR/L [phone] 12/07/2022, 9:33 AM

## 2022-12-07 NOTE — Patient Instructions (Signed)

## 2022-12-09 ENCOUNTER — Ambulatory Visit (HOSPITAL_COMMUNITY): Payer: Medicaid Other | Admitting: Occupational Therapy

## 2022-12-09 ENCOUNTER — Encounter (HOSPITAL_COMMUNITY): Payer: Self-pay | Admitting: Occupational Therapy

## 2022-12-09 ENCOUNTER — Other Ambulatory Visit: Payer: Self-pay | Admitting: Hematology and Oncology

## 2022-12-09 DIAGNOSIS — M25512 Pain in left shoulder: Secondary | ICD-10-CM

## 2022-12-09 DIAGNOSIS — M25612 Stiffness of left shoulder, not elsewhere classified: Secondary | ICD-10-CM

## 2022-12-09 DIAGNOSIS — R29898 Other symptoms and signs involving the musculoskeletal system: Secondary | ICD-10-CM

## 2022-12-09 NOTE — Patient Instructions (Signed)
  Complete the following 2-3 a day. Hold for 10-15 seconds. Complete 5 sets for each.   1) SHOULDER - ISOMETRIC FLEXION  Gently push your fist forward into a wall with your elbow bent.    2) SHOULDER - ISOMETRIC EXTENSION  Gently push your a bent elbow back into a wall.    3) SHOULDER - ISOMETRIC INTERNAL ROTATION   Gently press your hand into a wall using the palm side of your hand.  Maintain a bent elbow the entire time.        4) SHOULDER - ISOMETRIC ADDUCTION  Gently push your elbow into the side of your body.   5) SHOULDER - ISOMETRIC ABDUCTION  Gently push your elbow out to the side into a wall with your elbow bent.

## 2022-12-09 NOTE — Therapy (Signed)
OUTPATIENT OCCUPATIONAL THERAPY ORTHO TREATMENT  Patient Name: Monica [name] MRN: [id number] DOB:May 02, 1971, 52 y.o., female Today's Date: 12/09/2022  PCP: Royce Macadamia, PA-C REFERRING PROVIDER: Vanetta Mulders, MD  END OF SESSION:  OT End of Session - 12/09/22 0907     Visit Number 6    Number of Visits 20    Date for OT Re-Evaluation 01/22/23    Authorization Type [employer]    Authorization - Visit Number 5    Authorization - Number of Visits 12    OT Start Time [phone]    OT Stop Time 0945    OT Time Calculation (min) 40 min    Activity Tolerance Patient tolerated treatment well    Behavior During Therapy [facility] for tasks assessed/performed             Past Medical History:  Diagnosis Date   Asthma    Back pain    Breast cancer ([place])    Buttock pain    Family history of breast cancer 09/22/2020   Hypertension    Personal history of chemotherapy    Personal history of radiation therapy    Past Surgical History:  Procedure Laterality Date   ABDOMINAL HYSTERECTOMY     APPENDECTOMY     BREAST LUMPECTOMY WITH RADIOACTIVE SEED AND SENTINEL LYMPH NODE BIOPSY Right 09/10/2020   Procedure: RIGHT BREAST LUMPECTOMY WITH RADIOACTIVE SEED AND SENTINEL LYMPH NODE MAPPING;  Surgeon: Erroll Luna, MD;  Location: [place];  Service: General;  Laterality: Right;   PORTACATH PLACEMENT Right 10/16/2020   Procedure: INSERTION PORT-A-CATH WITH ULTRASOUND GUIDANCE;  Surgeon: Erroll Luna, MD;  Location: [place];  Service: General;  Laterality: Right;   RE-EXCISION OF BREAST LUMPECTOMY Right 10/16/2020   Procedure: RE-EXCISION OF RIGHT BREAST LUMPECTOMY;  Surgeon: Erroll Luna, MD;  Location: [place];  Service: General;  Laterality: Right;   REVERSE SHOULDER ARTHROPLASTY Left 11/16/2022   Procedure: LEFT REVERSE SHOULDER ARTHROPLASTY;  Surgeon: Vanetta Mulders, MD;  Location: [place];  Service:  Orthopedics;  Laterality: Left;   Patient Active Problem List   Diagnosis Date Noted   Primary osteoarthritis, left shoulder 11/16/2022   Spondylolisthesis at L5-S1 level 01/04/2022   Lumbar radiculopathy 01/04/2022   Trochanteric bursitis of left hip 11/10/2021   Muscle cramp, nocturnal 11/10/2021   Port-A-Cath in place 12/29/2020   Genetic testing 10/08/2020   Family history of breast cancer 09/22/2020   Primary malignant neoplasm of upper outer quadrant of female breast, right ([place]) 08/27/2020    ONSET DATE: 11/16/22  REFERRING DIAG: s/p left reverse TSA  THERAPY DIAG:  Acute pain of left shoulder  Stiffness of left shoulder, not elsewhere classified  Other symptoms and signs involving the musculoskeletal system  Rationale for Evaluation and Treatment: Rehabilitation  SUBJECTIVE:   SUBJECTIVE STATEMENT: S: "I just want my arm to feel normal again".   PERTINENT HISTORY: Pt is s/p left reverse TSA on 11/16/22. Pt reports she has been wearing the blue sling and has not straightened her arm since the surgery. She has high pain and is fearful of movement due to potential pain. Pt was referred to occupational therapy for evaluation and treatment by Dr. Vanetta Mulders.   PRECAUTIONS: Shoulder; waiting on protocol from MD  WEIGHT BEARING RESTRICTIONS: Yes NWB  PAIN:  Are you having pain? Yes: NPRS scale: 7/10 Pain location: left shoulder Pain description: sharp, toothache Aggravating factors: cold, rain Relieving factors: pain medication  FALLS: Has patient fallen  in last 6 months? No  PATIENT GOALS: To be able to use the LUE as her non-dominant.   NEXT MD VISIT: 12/01/22  OBJECTIVE:   HAND DOMINANCE: Right  ADLs: Overall ADLs: Pt reports everything is difficult-dressing, putting her hair wrap on, cooking, sweeping, putting shoes on, bathing tasks. Pt with difficulty sleeping, is sleeping on her couch.    FUNCTIONAL OUTCOME MEASURES: Quick Dash:  97.73   UPPER EXTREMITY ROM:     Passive ROM Left eval  Shoulder flexion 43  Shoulder abduction 42  Shoulder internal rotation 59  Shoulder external rotation -34  (Blank rows = not tested)      Active ROM Left eval  Shoulder flexion   Shoulder abduction   Shoulder internal rotation   Shoulder external rotation   (Blank rows = not tested)   UPPER EXTREMITY MMT:     MMT Left eval  Shoulder flexion   Shoulder abduction   Shoulder internal rotation   Shoulder external rotation   (Blank rows = not tested)  HAND FUNCTION: Grip strength: Right: 20 lbs; Left: 0 lbs  SENSATION: Pt reports tingling pins/needles in her left hand.   EDEMA: Intermittent  COGNITION: Overall cognitive status: Within functional limits for tasks assessed  OBSERVATIONS: pt with max fascial restrictions in left upper arm, anterior shoulder, trapezius, and scapular regions. Pt with max guarding and fear of pain with movement.    TODAY'S TREATMENT:                                                                                                                              DATE:   12/09/22 -Manual therapy: myofascial release and trigger point applied to LUE at the pectoralis, biceps, trapezius, and scapular region in order to decrease fascial restriction throughout upper arm and posterior trap -AA/ROM: supine, flexion, protraction, horizontal abduction, er/IR, x10 -Ball Rolls: flexion and abduction, x10 -Isometrics: flexion, extension, abduction, er, IR, 5x10"  12/07/22 -Pendulums, x60" -Manual therapy: myofascial release and trigger point applied to LUE at the pectoralis, biceps, trapezius, and scapular region in order to decrease fascial restriction throughout upper arm and posterior trap -P/ROM: supine, flexion, abduction, horizontal abduction, er/IR, x8 -AA/ROM: supine, flexion, protraction, horizontal abduction, er/IR, x10  12/03/22 -Manual therapy: max guarding throughout, manual therapy to  decrease fascial restriction throughout upper arm and posterior trap -P/ROM: supine, flexion, abduction, horizontal abduction, er/IR, x10 -Ball Rolls: flexion and abduction, x10    PATIENT EDUCATION: Education details: Best boy Person educated: Patient and Parent Education method: Consulting civil engineer, Media planner, and Handouts Education comprehension: verbalized understanding and returned demonstration  HOME EXERCISE PROGRAM: Eval: Pendulums, elbow AA/ROM, wrist A/ROM 12/26: Table Slides 1/2: AA/ROM 1/4: Isometrics  GOALS: Goals reviewed with patient? Yes  SHORT TERM GOALS: Target date: 12/23/22  Pt will be provided with and educated on HEP to improve ability to use LUE as assist during ADL tasks.   Goal status: IN PROGRESS  2.  Pt will increase P/ROM  of LUE to at least 50% ROM to improve ability to perform dressing tasks using compensatory strategies and minimial physical assistance.   Goal status: IN PROGRESS  3.  Pt will increase LUE strength to 3-/5 to improve ability to use LUE to reach items at waist height during ADLs such as eating or grooming.   Goal status: IN PROGRESS  4.  Pt will decrease LUE fascial restrictions to mod amounts or less to improve ability to perform low level functional reaching tasks and improve tolerance to HEP and ROM tasks.   Goal status: IN PROGRESS    LONG TERM GOALS: Target date: 01/23/23  Pt will decrease pain in LUE to 4/10 or less to improve ability to sleep in the bed for at least 2 hours at night.   Goal status: IN PROGRESS  2.  Pt will increase A/ROM of LUE to at least 75% range to improve ability to reach up and operate her head wrap and reach back to adjust her pants.   Goal status: IN PROGRESS  3.  Pt will decrease LUE fascial restrictions to minimal amounts or less to improve ability to perform functional reaching tasks when getting items out of the cabinets during meal preparation or when getting items from a closet.  Goal  status: IN PROGRESS  4.  Pt will increase LUE strength to a 4/5 or greater to improve ability to lift pots and pans and to perform housekeeping tasks.   Goal status: IN PROGRESS    ASSESSMENT:  CLINICAL IMPRESSION: Pt demonstrating improving ROM this session, using the dowel bar for AA/ROM. Additionally, her pain tolerance is improving, as she required no rest breaks with manual therapy this session and was able to complete isometric exercises in standing with short rest breaks between each set. She continues to have fascial restrictions in the biceps and subscapularis, however reports that she and her friend have been massaging it at home which helps. OT providing verbal and visual cuing for technique and positioning throughout this session.    PLAN:  OT FREQUENCY: 3x/week  OT DURATION: 8 weeks  PLANNED INTERVENTIONS: self care/ADL training, therapeutic exercise, therapeutic activity, manual therapy, passive range of motion, splinting, electrical stimulation, patient/family education, and Re-evaluation  CONSULTED AND AGREED WITH PLAN OF CARE: Patient  PLAN FOR NEXT SESSION: Manual Therapy, P/ROM, AA/ROM, Isometrics, Ball rolls progressing to wall slides, slowly initiate supine A/ROM, Tawanna Cooler, OTR/L [phone] 12/09/2022, 9:09 AM

## 2022-12-13 ENCOUNTER — Ambulatory Visit (HOSPITAL_COMMUNITY): Payer: Medicaid Other | Admitting: Occupational Therapy

## 2022-12-13 ENCOUNTER — Encounter (HOSPITAL_COMMUNITY): Payer: Self-pay | Admitting: Occupational Therapy

## 2022-12-13 DIAGNOSIS — M25512 Pain in left shoulder: Secondary | ICD-10-CM | POA: Diagnosis not present

## 2022-12-13 DIAGNOSIS — M25612 Stiffness of left shoulder, not elsewhere classified: Secondary | ICD-10-CM

## 2022-12-13 DIAGNOSIS — R29898 Other symptoms and signs involving the musculoskeletal system: Secondary | ICD-10-CM

## 2022-12-13 NOTE — Therapy (Signed)
OUTPATIENT OCCUPATIONAL THERAPY ORTHO TREATMENT  Patient Name: Monica [name] MRN: [id number] DOB:Mar 15, 1971, 52 y.o., female Today's Date: 12/13/2022  PCP: Royce Macadamia, PA-C REFERRING PROVIDER: Vanetta Mulders, MD  END OF SESSION:  OT End of Session - 12/13/22 1558     Visit Number 7    Number of Visits 20    Date for OT Re-Evaluation 01/22/23   mini-reassessment 12/23/22   Authorization Type [employer]    Authorization Time Period 12 visits approved 11/26/22-01/25/23    Authorization - Visit Number 6    Authorization - Number of Visits 12    OT Start Time 1520    OT Stop Time 4010    OT Time Calculation (min) 38 min    Activity Tolerance Patient tolerated treatment well    Behavior During Therapy WFL for tasks assessed/performed              Past Medical History:  Diagnosis Date   Asthma    Back pain    Breast cancer ([place])    Buttock pain    Family history of breast cancer 09/22/2020   Hypertension    Personal history of chemotherapy    Personal history of radiation therapy    Past Surgical History:  Procedure Laterality Date   ABDOMINAL HYSTERECTOMY     APPENDECTOMY     BREAST LUMPECTOMY WITH RADIOACTIVE SEED AND SENTINEL LYMPH NODE BIOPSY Right 09/10/2020   Procedure: RIGHT BREAST LUMPECTOMY WITH RADIOACTIVE SEED AND SENTINEL LYMPH NODE MAPPING;  Surgeon: Erroll Luna, MD;  Location: [place];  Service: General;  Laterality: Right;   PORTACATH PLACEMENT Right 10/16/2020   Procedure: INSERTION PORT-A-CATH WITH ULTRASOUND GUIDANCE;  Surgeon: Erroll Luna, MD;  Location: [place];  Service: General;  Laterality: Right;   RE-EXCISION OF BREAST LUMPECTOMY Right 10/16/2020   Procedure: RE-EXCISION OF RIGHT BREAST LUMPECTOMY;  Surgeon: Erroll Luna, MD;  Location: [place];  Service: General;  Laterality: Right;   REVERSE SHOULDER ARTHROPLASTY Left 11/16/2022   Procedure: LEFT REVERSE SHOULDER  ARTHROPLASTY;  Surgeon: Vanetta Mulders, MD;  Location: [place];  Service: Orthopedics;  Laterality: Left;   Patient Active Problem List   Diagnosis Date Noted   Primary osteoarthritis, left shoulder 11/16/2022   Spondylolisthesis at L5-S1 level 01/04/2022   Lumbar radiculopathy 01/04/2022   Trochanteric bursitis of left hip 11/10/2021   Muscle cramp, nocturnal 11/10/2021   Port-A-Cath in place 12/29/2020   Genetic testing 10/08/2020   Family history of breast cancer 09/22/2020   Primary malignant neoplasm of upper outer quadrant of female breast, right ([place]) 08/27/2020    ONSET DATE: 11/16/22  REFERRING DIAG: s/p left reverse TSA  THERAPY DIAG:  Acute pain of left shoulder  Stiffness of left shoulder, not elsewhere classified  Other symptoms and signs involving the musculoskeletal system  Rationale for Evaluation and Treatment: Rehabilitation  SUBJECTIVE:   SUBJECTIVE STATEMENT: S: "I'm still numb in my forearm."   PERTINENT HISTORY: Pt is s/p left reverse TSA on 11/16/22. Pt reports she has been wearing the blue sling and has not straightened her arm since the surgery. She has high pain and is fearful of movement due to potential pain. Pt was referred to occupational therapy for evaluation and treatment by Dr. Vanetta Mulders.   PRECAUTIONS: Shoulder; waiting on protocol from MD  WEIGHT BEARING RESTRICTIONS: Yes NWB  PAIN:  Are you having pain? Yes: NPRS scale: 6/10 Pain location: left shoulder Pain description: sharp, toothache Aggravating factors:  cold, rain Relieving factors: pain medication  FALLS: Has patient fallen in last 6 months? No  PATIENT GOALS: To be able to use the LUE as her non-dominant.   NEXT MD VISIT: 12/01/22  OBJECTIVE:   HAND DOMINANCE: Right  ADLs: Overall ADLs: Pt reports everything is difficult-dressing, putting her hair wrap on, cooking, sweeping, putting shoes on, bathing tasks. Pt with difficulty sleeping, is  sleeping on her couch.    FUNCTIONAL OUTCOME MEASURES: Quick Dash: 97.73   UPPER EXTREMITY ROM:     Passive ROM Left eval  Shoulder flexion 43  Shoulder abduction 42  Shoulder internal rotation 59  Shoulder external rotation -34  (Blank rows = not tested)      Active ROM Left eval  Shoulder flexion   Shoulder abduction   Shoulder internal rotation   Shoulder external rotation   (Blank rows = not tested)   UPPER EXTREMITY MMT:     MMT Left eval  Shoulder flexion   Shoulder abduction   Shoulder internal rotation   Shoulder external rotation   (Blank rows = not tested)  HAND FUNCTION: Grip strength: Right: 20 lbs; Left: 0 lbs  SENSATION: Pt reports tingling pins/needles in her left hand.   EDEMA: Intermittent   OBSERVATIONS: pt with max fascial restrictions in left upper arm, anterior shoulder, trapezius, and scapular regions. Pt with max guarding and fear of pain with movement.    TODAY'S TREATMENT:                                                                                                                              DATE:   12/13/22 -Manual therapy: myofascial release and trigger point applied to LUE at the pectoralis, biceps, trapezius, and scapular region in order to decrease fascial restriction throughout upper arm and posterior trap -Muscle energy technique to shoulder flexors, 2X 10" holds -P/ROM: supine-flexion, abduction, horizontal abduction, er/IR, x10 -AA/ROM: supine-flexion, protraction, horizontal abduction, er/IR, abduction, x10 -AA/ROM: seated-flexion, protraction, horizontal abduction, er/IR, x10 -Pulleys: 1' flexion, 1' abduction   12/09/22 -Manual therapy: myofascial release and trigger point applied to LUE at the pectoralis, biceps, trapezius, and scapular region in order to decrease fascial restriction throughout upper arm and posterior trap -AA/ROM: supine, flexion, protraction, horizontal abduction, er/IR, x10 -Ball Rolls: flexion  and abduction, x10 -Isometrics: flexion, extension, abduction, er, IR, 5x10"  12/07/22 -Pendulums, x60" -Manual therapy: myofascial release and trigger point applied to LUE at the pectoralis, biceps, trapezius, and scapular region in order to decrease fascial restriction throughout upper arm and posterior trap -P/ROM: supine, flexion, abduction, horizontal abduction, er/IR, x8 -AA/ROM: supine, flexion, protraction, horizontal abduction, er/IR, x10  PATIENT EDUCATION: Education details: Educated on only completing AA/ROM and isometrics Person educated: Patient and Parent Education method: Consulting civil engineer, Media planner, and Handouts Education comprehension: verbalized understanding and returned demonstration  HOME EXERCISE PROGRAM: Eval: Pendulums, elbow AA/ROM, wrist A/ROM 12/26: Table Slides 1/2: AA/ROM 1/4: Isometrics  GOALS: Goals reviewed with patient? Yes  SHORT TERM GOALS: Target date: 12/23/22  Pt will be provided with and educated on HEP to improve ability to use LUE as assist during ADL tasks.   Goal status: IN PROGRESS  2.  Pt will increase P/ROM of LUE to at least 50% ROM to improve ability to perform dressing tasks using compensatory strategies and minimial physical assistance.   Goal status: IN PROGRESS  3.  Pt will increase LUE strength to 3-/5 to improve ability to use LUE to reach items at waist height during ADLs such as eating or grooming.   Goal status: IN PROGRESS  4.  Pt will decrease LUE fascial restrictions to mod amounts or less to improve ability to perform low level functional reaching tasks and improve tolerance to HEP and ROM tasks.   Goal status: IN PROGRESS    LONG TERM GOALS: Target date: 01/23/23  Pt will decrease pain in LUE to 4/10 or less to improve ability to sleep in the bed for at least 2 hours at night.   Goal status: IN PROGRESS  2.  Pt will increase A/ROM of LUE to at least 75% range to improve ability to reach up and operate her head  wrap and reach back to adjust her pants.   Goal status: IN PROGRESS  3.  Pt will decrease LUE fascial restrictions to minimal amounts or less to improve ability to perform functional reaching tasks when getting items out of the cabinets during meal preparation or when getting items from a closet.  Goal status: IN PROGRESS  4.  Pt will increase LUE strength to a 4/5 or greater to improve ability to lift pots and pans and to perform housekeeping tasks.   Goal status: IN PROGRESS    ASSESSMENT:  CLINICAL IMPRESSION: Pt reports she can hold her arm down by her side and she is taking her sling off when she is at home. Completed myofascial release and passive stretching today. Pt with tight end feel during passive stretching at approximately 50-60% range, completed muscle energy technique for flexors with min improvement. Continued with AA/ROM in supine and sitting with focus on form. Pt reports she  is completing all previous HEPs, educated on completed only AA/ROM and isometrics. Verbal cuing for form and technique during tasks today.    PLAN:  OT FREQUENCY: 3x/week  OT DURATION: 8 weeks  PLANNED INTERVENTIONS: self care/ADL training, therapeutic exercise, therapeutic activity, manual therapy, passive range of motion, splinting, electrical stimulation, patient/family education, and Re-evaluation  CONSULTED AND AGREED WITH PLAN OF CARE: Patient  PLAN FOR NEXT SESSION: Manual Therapy, P/ROM, AA/ROM, Isometrics, slowly initiate supine A/ROM, Bobbye Charleston, OTR/L  [phone] 12/13/2022, 4:00 PM

## 2022-12-15 ENCOUNTER — Ambulatory Visit (HOSPITAL_COMMUNITY): Payer: Medicaid Other | Admitting: Occupational Therapy

## 2022-12-15 ENCOUNTER — Encounter (HOSPITAL_COMMUNITY): Payer: Self-pay | Admitting: Occupational Therapy

## 2022-12-15 DIAGNOSIS — M25512 Pain in left shoulder: Secondary | ICD-10-CM

## 2022-12-15 DIAGNOSIS — M25612 Stiffness of left shoulder, not elsewhere classified: Secondary | ICD-10-CM

## 2022-12-15 DIAGNOSIS — R29898 Other symptoms and signs involving the musculoskeletal system: Secondary | ICD-10-CM

## 2022-12-15 NOTE — Therapy (Signed)
OUTPATIENT OCCUPATIONAL THERAPY ORTHO TREATMENT  Patient Name: Monica [name] MRN: [id number] DOB:12-02-1971, 52 y.o., female Today's Date: 12/15/2022  PCP: Royce Macadamia, PA-C REFERRING PROVIDER: Vanetta Mulders, MD  END OF SESSION:  OT End of Session - 12/15/22 1017     Visit Number 8    Number of Visits 20    Date for OT Re-Evaluation 01/22/23   mini-reassessment 12/23/22   Authorization Type [employer]    Authorization Time Period 12 visits approved 11/26/22-01/25/23    Authorization - Visit Number 7    Authorization - Number of Visits 12    OT Start Time [phone]    OT Stop Time 1030    OT Time Calculation (min) 38 min    Activity Tolerance Patient tolerated treatment well    Behavior During Therapy WFL for tasks assessed/performed              Past Medical History:  Diagnosis Date   Asthma    Back pain    Breast cancer ([place])    Buttock pain    Family history of breast cancer 09/22/2020   Hypertension    Personal history of chemotherapy    Personal history of radiation therapy    Past Surgical History:  Procedure Laterality Date   ABDOMINAL HYSTERECTOMY     APPENDECTOMY     BREAST LUMPECTOMY WITH RADIOACTIVE SEED AND SENTINEL LYMPH NODE BIOPSY Right 09/10/2020   Procedure: RIGHT BREAST LUMPECTOMY WITH RADIOACTIVE SEED AND SENTINEL [place];  Surgeon: Erroll Luna, MD;  Location: [facility];  Service: General;  Laterality: Right;   PORTACATH PLACEMENT Right 10/16/2020   Procedure: INSERTION PORT-A-CATH WITH ULTRASOUND GUIDANCE;  Surgeon: Erroll Luna, MD;  Location: [facility];  Service: General;  Laterality: Right;   RE-EXCISION OF BREAST LUMPECTOMY Right 10/16/2020   Procedure: RE-EXCISION OF RIGHT BREAST LUMPECTOMY;  Surgeon: Erroll Luna, MD;  Location: [facility];  Service: General;  Laterality: Right;   REVERSE SHOULDER ARTHROPLASTY Left 11/16/2022   Procedure: LEFT REVERSE  SHOULDER ARTHROPLASTY;  Surgeon: Vanetta Mulders, MD;  Location: [facility];  Service: Orthopedics;  Laterality: Left;   Patient Active Problem List   Diagnosis Date Noted   Primary osteoarthritis, left shoulder 11/16/2022   Spondylolisthesis at L5-S1 level 01/04/2022   Lumbar radiculopathy 01/04/2022   Trochanteric bursitis of left hip 11/10/2021   Muscle cramp, nocturnal 11/10/2021   Port-A-Cath in place 12/29/2020   Genetic testing 10/08/2020   Family history of breast cancer 09/22/2020   Primary malignant neoplasm of upper outer quadrant of female breast, right ([place]) 08/27/2020    ONSET DATE: 11/16/22  REFERRING DIAG: s/p left reverse TSA  THERAPY DIAG:  Acute pain of left shoulder  Stiffness of left shoulder, not elsewhere classified  Other symptoms and signs involving the musculoskeletal system  Rationale for Evaluation and Treatment: Rehabilitation  SUBJECTIVE:   SUBJECTIVE STATEMENT: S: "I am really hurting today and sore, I almost cancelled"   PERTINENT HISTORY: Pt is s/p left reverse TSA on 11/16/22. Pt reports she has been wearing the blue sling and has not straightened her arm since the surgery. She has high pain and is fearful of movement due to potential pain. Pt was referred to occupational therapy for evaluation and treatment by Dr. Vanetta Mulders.   PRECAUTIONS: Shoulder; waiting on protocol from MD  WEIGHT BEARING RESTRICTIONS: Yes NWB  PAIN:  Are you having pain? Yes: NPRS scale: 10/10 Pain location: left shoulder Pain description:  sharp, toothache Aggravating factors: cold, rain Relieving factors: pain medication  FALLS: Has patient fallen in last 6 months? No  PATIENT GOALS: To be able to use the LUE as her non-dominant.   NEXT MD VISIT: 12/01/22  OBJECTIVE:   HAND DOMINANCE: Right  ADLs: Overall ADLs: Pt reports everything is difficult-dressing, putting her hair wrap on, cooking, sweeping, putting shoes on, bathing tasks.  Pt with difficulty sleeping, is sleeping on her couch.    FUNCTIONAL OUTCOME MEASURES: Quick Dash: 97.73   UPPER EXTREMITY ROM:     Passive ROM Left eval  Shoulder flexion 43  Shoulder abduction 42  Shoulder internal rotation 59  Shoulder external rotation -34  (Blank rows = not tested)      Active ROM Left eval  Shoulder flexion   Shoulder abduction   Shoulder internal rotation   Shoulder external rotation   (Blank rows = not tested)   UPPER EXTREMITY MMT:     MMT Left eval  Shoulder flexion   Shoulder abduction   Shoulder internal rotation   Shoulder external rotation   (Blank rows = not tested)  HAND FUNCTION: Grip strength: Right: 20 lbs; Left: 0 lbs  SENSATION: Pt reports tingling pins/needles in her left hand.   EDEMA: Intermittent   OBSERVATIONS: pt with max fascial restrictions in left upper arm, anterior shoulder, trapezius, and scapular regions. Pt with max guarding and fear of pain with movement.    TODAY'S TREATMENT:                                                                                                                              DATE:   12/15/22 -Manual therapy: myofascial release and trigger point applied to LUE at the pectoralis, biceps, trapezius, and scapular region in order to decrease fascial restriction throughout upper arm and posterior trap -P/ROM: supine-flexion, abduction, horizontal abduction, er/IR, x10 -AA/ROM: supine-flexion, protraction, horizontal abduction, er/IR, abduction, x10 -Pulleys: 1' flexion, 1' abduction  12/13/22 -Manual therapy: myofascial release and trigger point applied to LUE at the pectoralis, biceps, trapezius, and scapular region in order to decrease fascial restriction throughout upper arm and posterior trap -Muscle energy technique to shoulder flexors, 2X 10" holds -P/ROM: supine-flexion, abduction, horizontal abduction, er/IR, x10 -AA/ROM: supine-flexion, protraction, horizontal abduction, er/IR,  abduction, x10 -AA/ROM: seated-flexion, protraction, horizontal abduction, er/IR, x10 -Pulleys: 1' flexion, 1' abduction  12/09/22 -Manual therapy: myofascial release and trigger point applied to LUE at the pectoralis, biceps, trapezius, and scapular region in order to decrease fascial restriction throughout upper arm and posterior trap -AA/ROM: supine, flexion, protraction, horizontal abduction, er/IR, x10 -Ball Rolls: flexion and abduction, x10 -Isometrics: flexion, extension, abduction, er, IR, 5x10"   PATIENT EDUCATION: Education details: Educated on only completing AA/ROM and isometrics Person educated: Patient and Parent Education method: Consulting civil engineer, Media planner, and Handouts Education comprehension: verbalized understanding and returned demonstration  HOME EXERCISE PROGRAM: Eval: Pendulums, elbow AA/ROM, wrist A/ROM 12/26: Table Slides 1/2: AA/ROM 1/4: Isometrics  GOALS: Goals reviewed with patient? Yes  SHORT TERM GOALS: Target date: 12/23/22  Pt will be provided with and educated on HEP to improve ability to use LUE as assist during ADL tasks.   Goal status: IN PROGRESS  2.  Pt will increase P/ROM of LUE to at least 50% ROM to improve ability to perform dressing tasks using compensatory strategies and minimial physical assistance.   Goal status: IN PROGRESS  3.  Pt will increase LUE strength to 3-/5 to improve ability to use LUE to reach items at waist height during ADLs such as eating or grooming.   Goal status: IN PROGRESS  4.  Pt will decrease LUE fascial restrictions to mod amounts or less to improve ability to perform low level functional reaching tasks and improve tolerance to HEP and ROM tasks.   Goal status: IN PROGRESS    LONG TERM GOALS: Target date: 01/23/23  Pt will decrease pain in LUE to 4/10 or less to improve ability to sleep in the bed for at least 2 hours at night.   Goal status: IN PROGRESS  2.  Pt will increase A/ROM of LUE to at least  75% range to improve ability to reach up and operate her head wrap and reach back to adjust her pants.   Goal status: IN PROGRESS  3.  Pt will decrease LUE fascial restrictions to minimal amounts or less to improve ability to perform functional reaching tasks when getting items out of the cabinets during meal preparation or when getting items from a closet.  Goal status: IN PROGRESS  4.  Pt will increase LUE strength to a 4/5 or greater to improve ability to lift pots and pans and to perform housekeeping tasks.   Goal status: IN PROGRESS    ASSESSMENT:  CLINICAL IMPRESSION: Pt presenting with increased pain and discomfort this session. She reported that she almost cancelled due to pain, but she knew this would help her. OT spent increased time with manual therapy and P/ROM to assist with stretching and reducing fascial restrictions. She required increased time for all tasks this session and was unable to tolerate much "pushing" to increase ROM. OT providing verbal and tactile cuing this session for technique and positioning.    PLAN:  OT FREQUENCY: 3x/week  OT DURATION: 8 weeks  PLANNED INTERVENTIONS: self care/ADL training, therapeutic exercise, therapeutic activity, manual therapy, passive range of motion, splinting, electrical stimulation, patient/family education, and Re-evaluation  CONSULTED AND AGREED WITH PLAN OF CARE: Patient  PLAN FOR NEXT SESSION: Manual Therapy, P/ROM, AA/ROM, Isometrics, slowly initiate supine A/ROM, Tawanna Cooler, OTR/L [phone] 12/15/2022, 10:19 AM

## 2022-12-17 ENCOUNTER — Encounter (HOSPITAL_COMMUNITY): Payer: Self-pay | Admitting: Occupational Therapy

## 2022-12-17 ENCOUNTER — Ambulatory Visit (HOSPITAL_COMMUNITY): Payer: Medicaid Other | Admitting: Occupational Therapy

## 2022-12-17 DIAGNOSIS — M25612 Stiffness of left shoulder, not elsewhere classified: Secondary | ICD-10-CM

## 2022-12-17 DIAGNOSIS — M25512 Pain in left shoulder: Secondary | ICD-10-CM | POA: Diagnosis not present

## 2022-12-17 DIAGNOSIS — R29898 Other symptoms and signs involving the musculoskeletal system: Secondary | ICD-10-CM

## 2022-12-17 NOTE — Therapy (Signed)
OUTPATIENT OCCUPATIONAL THERAPY ORTHO TREATMENT  Patient Name: Monica [name] MRN: [id number] DOB:05-14-1971, 52 y.o., female Today's Date: 12/17/2022  PCP: Royce Macadamia, PA-C REFERRING PROVIDER: Vanetta Mulders, MD  END OF SESSION:  OT End of Session - 12/17/22 0904     Visit Number 9    Number of Visits 20    Date for OT Re-Evaluation 01/22/23   mini-reassessment 12/23/22   Authorization Type [employer]    Authorization Time Period 12 visits approved 11/26/22-01/25/23    Authorization - Visit Number 8    Authorization - Number of Visits 12    OT Start Time 0905    OT Stop Time 0945    OT Time Calculation (min) 40 min    Activity Tolerance Patient tolerated treatment well    Behavior During Therapy [facility] for tasks assessed/performed              Past Medical History:  Diagnosis Date   Asthma    Back pain    Breast cancer ([place])    Buttock pain    Family history of breast cancer 09/22/2020   Hypertension    Personal history of chemotherapy    Personal history of radiation therapy    Past Surgical History:  Procedure Laterality Date   ABDOMINAL HYSTERECTOMY     APPENDECTOMY     BREAST LUMPECTOMY WITH RADIOACTIVE SEED AND SENTINEL LYMPH NODE BIOPSY Right 09/10/2020   Procedure: RIGHT BREAST LUMPECTOMY WITH RADIOACTIVE SEED AND SENTINEL [place];  Surgeon: Erroll Luna, MD;  Location: [place];  Service: General;  Laterality: Right;   PORTACATH PLACEMENT Right 10/16/2020   Procedure: INSERTION PORT-A-CATH WITH ULTRASOUND GUIDANCE;  Surgeon: Erroll Luna, MD;  Location: [facility];  Service: General;  Laterality: Right;   RE-EXCISION OF BREAST LUMPECTOMY Right 10/16/2020   Procedure: RE-EXCISION OF RIGHT BREAST LUMPECTOMY;  Surgeon: Erroll Luna, MD;  Location: [place];  Service: General;  Laterality: Right;   REVERSE SHOULDER ARTHROPLASTY Left 11/16/2022   Procedure: LEFT REVERSE  SHOULDER ARTHROPLASTY;  Surgeon: Vanetta Mulders, MD;  Location: [facility];  Service: Orthopedics;  Laterality: Left;   Patient Active Problem List   Diagnosis Date Noted   Primary osteoarthritis, left shoulder 11/16/2022   Spondylolisthesis at L5-S1 level 01/04/2022   Lumbar radiculopathy 01/04/2022   Trochanteric bursitis of left hip 11/10/2021   Muscle cramp, nocturnal 11/10/2021   Port-A-Cath in place 12/29/2020   Genetic testing 10/08/2020   Family history of breast cancer 09/22/2020   Primary malignant neoplasm of upper outer quadrant of female breast, right ([place]) 08/27/2020    ONSET DATE: 11/16/22  REFERRING DIAG: s/p left reverse TSA  THERAPY DIAG:  Acute pain of left shoulder  Stiffness of left shoulder, not elsewhere classified  Other symptoms and signs involving the musculoskeletal system  Rationale for Evaluation and Treatment: Rehabilitation  SUBJECTIVE:   SUBJECTIVE STATEMENT: S: "I am really hurting today and sore, I almost cancelled"   PERTINENT HISTORY: Pt is s/p left reverse TSA on 11/16/22. Pt reports she has been wearing the blue sling and has not straightened her arm since the surgery. She has high pain and is fearful of movement due to potential pain. Pt was referred to occupational therapy for evaluation and treatment by Dr. Vanetta Mulders.   PRECAUTIONS: Shoulder; waiting on protocol from MD  WEIGHT BEARING RESTRICTIONS: Yes NWB  PAIN:  Are you having pain? Yes: NPRS scale: 5/10 Pain location: left shoulder Pain description:  sharp, toothache Aggravating factors: cold, rain Relieving factors: pain medication  FALLS: Has patient fallen in last 6 months? No  PATIENT GOALS: To be able to use the LUE as her non-dominant.   NEXT MD VISIT: 12/01/22  OBJECTIVE:   HAND DOMINANCE: Right  ADLs: Overall ADLs: Pt reports everything is difficult-dressing, putting her hair wrap on, cooking, sweeping, putting shoes on, bathing tasks. Pt  with difficulty sleeping, is sleeping on her couch.    FUNCTIONAL OUTCOME MEASURES: Quick Dash: 97.73   UPPER EXTREMITY ROM:     Passive ROM Left eval  Shoulder flexion 43  Shoulder abduction 42  Shoulder internal rotation 59  Shoulder external rotation -34  (Blank rows = not tested)      Active ROM Left eval  Shoulder flexion   Shoulder abduction   Shoulder internal rotation   Shoulder external rotation   (Blank rows = not tested)   UPPER EXTREMITY MMT:     MMT Left eval  Shoulder flexion   Shoulder abduction   Shoulder internal rotation   Shoulder external rotation   (Blank rows = not tested)  HAND FUNCTION: Grip strength: Right: 20 lbs; Left: 0 lbs  SENSATION: Pt reports tingling pins/needles in her left hand.   EDEMA: Intermittent   OBSERVATIONS: pt with max fascial restrictions in left upper arm, anterior shoulder, trapezius, and scapular regions. Pt with max guarding and fear of pain with movement.    TODAY'S TREATMENT:                                                                                                                              DATE:   12/17/22 -Manual therapy: myofascial release and trigger point applied to LUE at the pectoralis, biceps, trapezius, and scapular region in order to decrease fascial restriction throughout upper arm and posterior trap -P/ROM: supine-flexion, abduction, horizontal abduction, er/IR, x5 -AA/ROM: supine-flexion, protraction, horizontal abduction, er/IR, abduction, x10 -Wall Slides: flexion and abduction x10  12/15/22 -Manual therapy: myofascial release and trigger point applied to LUE at the pectoralis, biceps, trapezius, and scapular region in order to decrease fascial restriction throughout upper arm and posterior trap -P/ROM: supine-flexion, abduction, horizontal abduction, er/IR, x10 -AA/ROM: supine-flexion, protraction, horizontal abduction, er/IR, abduction, x10 -Pulleys: 1' flexion, 1'  abduction  12/13/22 -Manual therapy: myofascial release and trigger point applied to LUE at the pectoralis, biceps, trapezius, and scapular region in order to decrease fascial restriction throughout upper arm and posterior trap -Muscle energy technique to shoulder flexors, 2X 10" holds -P/ROM: supine-flexion, abduction, horizontal abduction, er/IR, x10 -AA/ROM: supine-flexion, protraction, horizontal abduction, er/IR, abduction, x10 -AA/ROM: seated-flexion, protraction, horizontal abduction, er/IR, x10 -Pulleys: 1' flexion, 1' abduction   PATIENT EDUCATION: Education details: Wall Slides Person educated: Patient and Parent Education method: Explanation, Demonstration, and Handouts Education comprehension: verbalized understanding and returned demonstration  HOME EXERCISE PROGRAM: Eval: Pendulums, elbow AA/ROM, wrist A/ROM 12/26: Table Slides 1/2: AA/ROM 1/4: Isometrics 1/12: Wall Slides  GOALS: Goals  reviewed with patient? Yes  SHORT TERM GOALS: Target date: 12/23/22  Pt will be provided with and educated on HEP to improve ability to use LUE as assist during ADL tasks.   Goal status: IN PROGRESS  2.  Pt will increase P/ROM of LUE to at least 50% ROM to improve ability to perform dressing tasks using compensatory strategies and minimial physical assistance.   Goal status: IN PROGRESS  3.  Pt will increase LUE strength to 3-/5 to improve ability to use LUE to reach items at waist height during ADLs such as eating or grooming.   Goal status: IN PROGRESS  4.  Pt will decrease LUE fascial restrictions to mod amounts or less to improve ability to perform low level functional reaching tasks and improve tolerance to HEP and ROM tasks.   Goal status: IN PROGRESS    LONG TERM GOALS: Target date: 01/23/23  Pt will decrease pain in LUE to 4/10 or less to improve ability to sleep in the bed for at least 2 hours at night.   Goal status: IN PROGRESS  2.  Pt will increase A/ROM of  LUE to at least 75% range to improve ability to reach up and operate her head wrap and reach back to adjust her pants.   Goal status: IN PROGRESS  3.  Pt will decrease LUE fascial restrictions to minimal amounts or less to improve ability to perform functional reaching tasks when getting items out of the cabinets during meal preparation or when getting items from a closet.  Goal status: IN PROGRESS  4.  Pt will increase LUE strength to a 4/5 or greater to improve ability to lift pots and pans and to perform housekeeping tasks.   Goal status: IN PROGRESS    ASSESSMENT:  CLINICAL IMPRESSION: Pt reported that she had a rough night last night, however after she took some pain medication and worked on relaxing her arm, she has felt more comfortable. This session pt had decreased fascial restrictions along the scapula and subscapularis, however she continues to have moderate restrictions along the biceps and trapezius, addressed with manual therapy. When working on P/ROM this session, as well as with the added wall slides, pt continued to report that her arm kept feeling like it was getting stuck, which was significantly limiting her ROM. She took multiple breaks during these activities to complete pendulums for relaxation. OT providing verbal and tactile cuing for positioning and technique throughout session.    PLAN:  OT FREQUENCY: 3x/week  OT DURATION: 8 weeks  PLANNED INTERVENTIONS: self care/ADL training, therapeutic exercise, therapeutic activity, manual therapy, passive range of motion, splinting, electrical stimulation, patient/family education, and Re-evaluation  CONSULTED AND AGREED WITH PLAN OF CARE: Patient  PLAN FOR NEXT SESSION: Manual Therapy, P/ROM, AA/ROM, Isometrics, slowly initiate supine A/ROM, Tawanna Cooler, OTR/L [phone] 12/17/2022, 9:05 AM

## 2022-12-21 ENCOUNTER — Encounter (HOSPITAL_COMMUNITY): Payer: Self-pay | Admitting: Occupational Therapy

## 2022-12-21 ENCOUNTER — Ambulatory Visit (HOSPITAL_COMMUNITY): Payer: Medicaid Other | Admitting: Occupational Therapy

## 2022-12-21 ENCOUNTER — Telehealth: Payer: Self-pay | Admitting: Orthopaedic Surgery

## 2022-12-21 DIAGNOSIS — M25512 Pain in left shoulder: Secondary | ICD-10-CM | POA: Diagnosis not present

## 2022-12-21 DIAGNOSIS — R29898 Other symptoms and signs involving the musculoskeletal system: Secondary | ICD-10-CM

## 2022-12-21 DIAGNOSIS — M25612 Stiffness of left shoulder, not elsewhere classified: Secondary | ICD-10-CM

## 2022-12-21 NOTE — Patient Instructions (Signed)

## 2022-12-21 NOTE — Telephone Encounter (Signed)
Patient states she wants her medication called into her pharmacy today and she has already called 3 other times. Requesting pain medication( Hydrocodone ).. Please advise

## 2022-12-21 NOTE — Telephone Encounter (Signed)
Pt called requesting refill of hydrocodone. Pt states she called this morning but chart is not noted. Please send to [facility] [place]. Pt phone number is [phone].

## 2022-12-21 NOTE — Therapy (Signed)
OUTPATIENT OCCUPATIONAL THERAPY ORTHO TREATMENT and PROGRESS NOTE  Patient Name: Monica [name] MRN: [id number] DOB:Jan 12, 1971, 52 y.o., female Today's Date: 12/21/2022  PCP: Royce Macadamia, PA-C REFERRING PROVIDER: Vanetta Mulders, MD  Progress Note Reporting Period 11/23/22 to 12/21/22  See note below for Objective Data and Assessment of Progress/Goals.    END OF SESSION:  OT End of Session - 12/21/22 0908     Visit Number 10    Number of Visits 20    Date for OT Re-Evaluation 01/22/23   mini-reassessment 12/23/22   Authorization Type [employer]    Authorization Time Period 12 visits approved 11/26/22-01/25/23    Authorization - Visit Number 9    Authorization - Number of Visits 12    OT Start Time 0902    OT Stop Time 5631    OT Time Calculation (min) 40 min    Activity Tolerance Patient tolerated treatment well    Behavior During Therapy [facility] for tasks assessed/performed              Past Medical History:  Diagnosis Date   Asthma    Back pain    Breast cancer ([place])    Buttock pain    Family history of breast cancer 09/22/2020   Hypertension    Personal history of chemotherapy    Personal history of radiation therapy    Past Surgical History:  Procedure Laterality Date   ABDOMINAL HYSTERECTOMY     APPENDECTOMY     BREAST LUMPECTOMY WITH RADIOACTIVE SEED AND SENTINEL LYMPH NODE BIOPSY Right 09/10/2020   Procedure: RIGHT BREAST LUMPECTOMY WITH RADIOACTIVE SEED AND SENTINEL LYMPH NODE MAPPING;  Surgeon: Erroll Luna, MD;  Location: [place];  Service: General;  Laterality: Right;   PORTACATH PLACEMENT Right 10/16/2020   Procedure: INSERTION PORT-A-CATH WITH ULTRASOUND GUIDANCE;  Surgeon: Erroll Luna, MD;  Location: [place];  Service: General;  Laterality: Right;   RE-EXCISION OF BREAST LUMPECTOMY Right 10/16/2020   Procedure: RE-EXCISION OF RIGHT BREAST LUMPECTOMY;  Surgeon: Erroll Luna, MD;  Location:  [place];  Service: General;  Laterality: Right;   REVERSE SHOULDER ARTHROPLASTY Left 11/16/2022   Procedure: LEFT REVERSE SHOULDER ARTHROPLASTY;  Surgeon: Vanetta Mulders, MD;  Location: [place];  Service: Orthopedics;  Laterality: Left;   Patient Active Problem List   Diagnosis Date Noted   Primary osteoarthritis, left shoulder 11/16/2022   Spondylolisthesis at L5-S1 level 01/04/2022   Lumbar radiculopathy 01/04/2022   Trochanteric bursitis of left hip 11/10/2021   Muscle cramp, nocturnal 11/10/2021   Port-A-Cath in place 12/29/2020   Genetic testing 10/08/2020   Family history of breast cancer 09/22/2020   Primary malignant neoplasm of upper outer quadrant of female breast, right ([place]) 08/27/2020    ONSET DATE: 11/16/22  REFERRING DIAG: s/p left reverse TSA  THERAPY DIAG:  Acute pain of left shoulder  Stiffness of left shoulder, not elsewhere classified  Other symptoms and signs involving the musculoskeletal system  Rationale for Evaluation and Treatment: Rehabilitation  SUBJECTIVE:   SUBJECTIVE STATEMENT: S: "I am really hurting today and sore, I almost cancelled"   PERTINENT HISTORY: Pt is s/p left reverse TSA on 11/16/22. Pt reports she has been wearing the blue sling and has not straightened her arm since the surgery. She has high pain and is fearful of movement due to potential pain. Pt was referred to occupational therapy for evaluation and treatment by Dr. Vanetta Mulders.   PRECAUTIONS: Shoulder; waiting on protocol from  MD  WEIGHT BEARING RESTRICTIONS: Yes NWB  PAIN:  Are you having pain? Yes: NPRS scale: 5/10 Pain location: left shoulder Pain description: sharp, toothache Aggravating factors: cold, rain Relieving factors: pain medication  FALLS: Has patient fallen in last 6 months? No  PATIENT GOALS: To be able to use the LUE as her non-dominant.   NEXT MD VISIT: 12/01/22  OBJECTIVE:   HAND DOMINANCE:  Right  ADLs: Overall ADLs: Pt reports everything is difficult-dressing, putting her hair wrap on, cooking, sweeping, putting shoes on, bathing tasks. Pt with difficulty sleeping, is sleeping on her couch.    FUNCTIONAL OUTCOME MEASURES: Quick Dash: 97.73   UPPER EXTREMITY ROM:     Passive ROM Left eval Left 12/21/22  Shoulder flexion 43 121  Shoulder abduction 42 136  Shoulder internal rotation 59 90  Shoulder external rotation -34 17  (Blank rows = not tested)      Active ROM Left eval Left 12/21/22  Shoulder flexion  116  Shoulder abduction  105  Shoulder internal rotation  90  Shoulder external rotation  7  (Blank rows = not tested)   UPPER EXTREMITY MMT:     MMT Left eval  Shoulder flexion   Shoulder abduction   Shoulder internal rotation   Shoulder external rotation   (Blank rows = not tested)  HAND FUNCTION: Grip strength: Right: 20 lbs; Left: 0 lbs  SENSATION: Pt reports tingling pins/needles in her left hand.   EDEMA: Intermittent   OBSERVATIONS: pt with max fascial restrictions in left upper arm, anterior shoulder, trapezius, and scapular regions. Pt with max guarding and fear of pain with movement.    TODAY'S TREATMENT:                                                                                                                              DATE:   12/21/22 -Manual therapy: myofascial release and trigger point applied to LUE at the pectoralis, biceps, trapezius, and scapular region in order to decrease fascial restriction throughout upper arm and posterior trap -P/ROM: supine-flexion, abduction, horizontal abduction, er/IR, x8 -AA/ROM: supine-flexion, protraction, horizontal abduction, er/IR, abduction, x10 -A/ROM: supine, flexion, protraction, horizontal abduction, er/IR, abduction, x5 -Ball Rolls up the wall: flexion, abduction, x8  12/17/22 -Manual therapy: myofascial release and trigger point applied to LUE at the pectoralis, biceps,  trapezius, and scapular region in order to decrease fascial restriction throughout upper arm and posterior trap -P/ROM: supine-flexion, abduction, horizontal abduction, er/IR, x5 -AA/ROM: supine-flexion, protraction, horizontal abduction, er/IR, abduction, x10 -Wall Slides: flexion and abduction x10  12/15/22 -Manual therapy: myofascial release and trigger point applied to LUE at the pectoralis, biceps, trapezius, and scapular region in order to decrease fascial restriction throughout upper arm and posterior trap -P/ROM: supine-flexion, abduction, horizontal abduction, er/IR, x10 -AA/ROM: supine-flexion, protraction, horizontal abduction, er/IR, abduction, x10 -Pulleys: 1' flexion, 1' abduction    PATIENT EDUCATION: Education details: A/ROM Person educated: Patient and Parent Education method: Consulting civil engineer, Demonstration,  and Handouts Education comprehension: verbalized understanding and returned demonstration  HOME EXERCISE PROGRAM: Eval: Pendulums, elbow AA/ROM, wrist A/ROM 12/26: Table Slides 1/2: AA/ROM 1/4: Isometrics 1/12: Wall Slides 1/16: A/ROM  GOALS: Goals reviewed with patient? Yes  SHORT TERM GOALS: Target date: 12/23/22  Pt will be provided with and educated on HEP to improve ability to use LUE as assist during ADL tasks.   Goal status: IN PROGRESS  2.  Pt will increase P/ROM of LUE to at least 50% ROM to improve ability to perform dressing tasks using compensatory strategies and minimial physical assistance.   Goal status: IN PROGRESS  3.  Pt will increase LUE strength to 3-/5 to improve ability to use LUE to reach items at waist height during ADLs such as eating or grooming.   Goal status: IN PROGRESS  4.  Pt will decrease LUE fascial restrictions to mod amounts or less to improve ability to perform low level functional reaching tasks and improve tolerance to HEP and ROM tasks.   Goal status: IN PROGRESS    LONG TERM GOALS: Target date: 01/23/23  Pt will  decrease pain in LUE to 4/10 or less to improve ability to sleep in the bed for at least 2 hours at night.   Goal status: IN PROGRESS  2.  Pt will increase A/ROM of LUE to at least 75% range to improve ability to reach up and operate her head wrap and reach back to adjust her pants.   Goal status: IN PROGRESS  3.  Pt will decrease LUE fascial restrictions to minimal amounts or less to improve ability to perform functional reaching tasks when getting items out of the cabinets during meal preparation or when getting items from a closet.  Goal status: IN PROGRESS  4.  Pt will increase LUE strength to a 4/5 or greater to improve ability to lift pots and pans and to perform housekeeping tasks.   Goal status: IN PROGRESS    ASSESSMENT:  CLINICAL IMPRESSION: Pt was seen this session for a progress note. She continues to have increased tightness and limited ROM, however her P/ROM is improving greatly. She is approximately 75% of full ROM with P/ROM and AA/ROM. This session OT initiated A/ROM, where pt demonstrated approximately 60-65% of full ROM and no increased pain noted. Pt reports that she has some burning sensation running down the radial aspect of her arm from the bicep region to her thumb. OT requested if the burning continues to get in touch with her Ortho MD. Pt was provided verbal and tactile cuing for positioning and technique.   PLAN:  OT FREQUENCY: 3x/week  OT DURATION: 8 weeks  PLANNED INTERVENTIONS: self care/ADL training, therapeutic exercise, therapeutic activity, manual therapy, passive range of motion, splinting, electrical stimulation, patient/family education, and Re-evaluation  CONSULTED AND AGREED WITH PLAN OF CARE: Patient  PLAN FOR NEXT SESSION: Manual Therapy, P/ROM, AA/ROM, Isometrics, A/ROM, Tawanna Cooler, OTR/L [phone] 12/21/2022, 9:09 AM

## 2022-12-22 ENCOUNTER — Other Ambulatory Visit: Payer: Self-pay

## 2022-12-22 ENCOUNTER — Telehealth: Payer: Self-pay | Admitting: Orthopaedic Surgery

## 2022-12-22 ENCOUNTER — Other Ambulatory Visit (HOSPITAL_BASED_OUTPATIENT_CLINIC_OR_DEPARTMENT_OTHER): Payer: Self-pay | Admitting: Orthopaedic Surgery

## 2022-12-22 ENCOUNTER — Encounter (HOSPITAL_COMMUNITY): Payer: Self-pay

## 2022-12-22 ENCOUNTER — Emergency Department (HOSPITAL_COMMUNITY): Payer: Medicaid Other

## 2022-12-22 ENCOUNTER — Emergency Department (HOSPITAL_COMMUNITY)
Admission: EM | Admit: 2022-12-22 | Discharge: 2022-12-22 | Disposition: A | Discharge status: Home / Self Care | Payer: Medicaid Other | Attending: Emergency Medicine | Admitting: Emergency Medicine

## 2022-12-22 DIAGNOSIS — Z20822 Contact with and (suspected) exposure to covid-19: Secondary | ICD-10-CM | POA: Diagnosis not present

## 2022-12-22 DIAGNOSIS — Z7951 Long term (current) use of inhaled steroids: Secondary | ICD-10-CM | POA: Insufficient documentation

## 2022-12-22 DIAGNOSIS — Z7952 Long term (current) use of systemic steroids: Secondary | ICD-10-CM | POA: Diagnosis not present

## 2022-12-22 DIAGNOSIS — Z853 Personal history of malignant neoplasm of breast: Secondary | ICD-10-CM | POA: Insufficient documentation

## 2022-12-22 DIAGNOSIS — J40 Bronchitis, not specified as acute or chronic: Secondary | ICD-10-CM

## 2022-12-22 DIAGNOSIS — I10 Essential (primary) hypertension: Secondary | ICD-10-CM | POA: Insufficient documentation

## 2022-12-22 DIAGNOSIS — Z79899 Other long term (current) drug therapy: Secondary | ICD-10-CM | POA: Diagnosis not present

## 2022-12-22 DIAGNOSIS — J45909 Unspecified asthma, uncomplicated: Secondary | ICD-10-CM | POA: Diagnosis not present

## 2022-12-22 DIAGNOSIS — Z7982 Long term (current) use of aspirin: Secondary | ICD-10-CM | POA: Diagnosis not present

## 2022-12-22 DIAGNOSIS — R059 Cough, unspecified: Secondary | ICD-10-CM | POA: Diagnosis present

## 2022-12-22 LAB — RESP PANEL BY RT-PCR (RSV, FLU A&B, COVID)  RVPGX2
Influenza A by PCR: NEGATIVE
Influenza B by PCR: NEGATIVE
Resp Syncytial Virus by PCR: NEGATIVE
SARS Coronavirus 2 by RT PCR: NEGATIVE

## 2022-12-22 MED ORDER — HYDROCODONE-ACETAMINOPHEN 5-325 MG PO TABS: 1.0000 | ORAL_TABLET | Freq: Four times a day (QID) | ORAL | 0 refills | Status: DC | PRN

## 2022-12-22 MED ORDER — PREDNISONE 50 MG PO TABS
60.0000 mg | ORAL_TABLET | Freq: Once | ORAL | Status: AC
  Administered 2022-12-22: 60 mg via ORAL
  Filled 2022-12-22: qty 1

## 2022-12-22 MED ORDER — ALBUTEROL SULFATE HFA 108 (90 BASE) MCG/ACT IN AERS
2.0000 | INHALATION_SPRAY | Freq: Once | RESPIRATORY_TRACT | Status: AC
  Administered 2022-12-22: 2 via RESPIRATORY_TRACT
  Filled 2022-12-22: qty 6.7

## 2022-12-22 MED ORDER — PREDNISONE 50 MG PO TABS: ORAL_TABLET | ORAL | 0 refills | Status: DC

## 2022-12-22 NOTE — Telephone Encounter (Signed)
RC to patient informing her Rx was sent to [facility] this morning

## 2022-12-22 NOTE — ED Notes (Signed)
PT Discharged home per MD order , discharge summary reviewed, pt verbalizes understanding. Ambulatory. No s/s of acute distress noted at discharge.

## 2022-12-22 NOTE — Telephone Encounter (Signed)
Patient states she is waiting on pain medication refill called yesterday. Please advise

## 2022-12-22 NOTE — ED Triage Notes (Signed)
Pt reports nasal congestion and cough since Saturday after being outside while leaves were burning.  Pt reports hx of asthma.

## 2022-12-22 NOTE — Discharge Instructions (Signed)
Use the albuterol inhaler given taking 2 puffs every 4 hours if needed for wheezing and cough, this can be used instead of your home nebulizer but not in addition to your nebulizer.  Take your next dose of prednisone tomorrow.

## 2022-12-22 NOTE — ED Provider Notes (Signed)
[facility] EMERGENCY DEPARTMENT Provider Note   CSN: [id number] Arrival date & time: 12/22/22  1021     History  Chief Complaint  Patient presents with   Cough    Monica [name] is a 52 y.o. female with a history including a right breast cancer, currently under surveillance, recent left rotator cuff revision surgery so is wearing a sling, hypertension, asthma, nonspecific pulmonary nodules currently under the care of Dr. Kimber Relic presenting for evaluation of cough and nasal congestion since she was exposed to burning leaves 4 days ago.  She does endorse clear rhinorrhea with nasal congestion but states she is having intermittent episodes of wheezing which does respond to her Symbicort inhaler which she takes twice daily.  She is concerned however because her cough has been productive of a green sputum since this exposure.  She denies chest pain, fevers, headache or facial pain.  She denies sore throat.  She is concerned about possible pneumonia, she contacted her pulmonologist and was advised to come here for chest x-ray.   Cough Associated symptoms: rhinorrhea, shortness of breath and wheezing   Associated symptoms: no chills and no fever        Home Medications Prior to Admission medications   Medication Sig Start Date End Date Taking? Authorizing Provider  predniSONE (DELTASONE) 50 MG tablet Take one tablet daily for 4 days 12/23/22  Yes Landis Martins  ADVAIR [facility] 115-21 MCG/ACT inhaler Inhale 2 puffs into the lungs 2 (two) times daily. 11/03/22   [provider]  albuterol (VENTOLIN HFA) 108 (90 Base) MCG/ACT inhaler Inhale 2 puffs into the lungs every 4 (four) hours as needed for wheezing or shortness of breath. 10/05/20   Wyvonnia Dusky, MD  aspirin EC 325 MG tablet Take 1 tablet (325 mg total) by mouth daily. 11/08/22   Vanetta Mulders, MD  baclofen (LIORESAL) 10 MG tablet Take 1 tablet (10 mg total) by mouth at bedtime as needed for muscle spasms. 11/10/21    Gregor Hams, MD  budesonide-formoterol [facility]) 160-4.5 MCG/ACT inhaler Inhale 2 puffs into the lungs in the morning and at bedtime. 06/22/22   Mannam, Hart Robinsons, MD  calcium citrate (CALCITRATE - DOSED IN MG ELEMENTAL CALCIUM) 950 (200 Ca) MG tablet Take 200 mg of elemental calcium by mouth daily.    [provider]  Cholecalciferol (VITAMIN D3) 125 MCG (5000 UT) TABS Take 1 tablet by mouth daily. 05/14/20   [provider]  clonazePAM (KLONOPIN) 0.5 MG tablet Take 0.5 mg by mouth daily as needed. 12/10/21   [provider]  escitalopram (LEXAPRO) 10 MG tablet Take 10 mg by mouth daily. 11/13/21   [provider]  gabapentin (NEURONTIN) 300 MG capsule Take 1 capsule (300 mg total) by mouth 3 (three) times daily. 12/11/21   Gregor Hams, MD  HYDROcodone-acetaminophen (NORCO/VICODIN) 5-325 MG tablet Take 1 tablet by mouth every 6 (six) hours as needed for moderate pain. 12/01/22   Vanetta Mulders, MD  HYDROcodone-acetaminophen (NORCO/VICODIN) 5-325 MG tablet Take 1 tablet by mouth every 6 (six) hours as needed. 12/22/22   Vanetta Mulders, MD  letrozole (FEMARA) 2.5 MG tablet TAKE 1 TABLET ONCE DAILY. 12/09/22   Nicholas Lose, MD  lisinopril (ZESTRIL) 10 MG tablet Take 1 tablet (10 mg total) by mouth daily. 11/10/20   Nicholas Lose, MD  oxyCODONE (OXY IR/ROXICODONE) 5 MG immediate release tablet Take 1 tablet (5 mg total) by mouth every 4 (four) hours as needed (severe pain). 11/08/22  Vanetta Mulders, MD  prazosin (MINIPRESS) 1 MG capsule Take 1 mg by mouth at bedtime.    [provider]      Allergies    Oxycodone    Review of Systems   Review of Systems  Constitutional:  Negative for chills and fever.  HENT:  Positive for congestion and rhinorrhea.   Respiratory:  Positive for cough, shortness of breath and wheezing.   Cardiovascular: Negative.     Physical Exam Updated Vital Signs BP 105/73 (BP Location: Right Arm)   Pulse 99   Temp 98.9 F (37.2  C) (Oral)   Resp 20   Ht '5\' 5"'$  (1.651 m)   Wt 72.6 kg   SpO2 100%   BMI 26.63 kg/m  Physical Exam Vitals and nursing note reviewed.  Constitutional:      Appearance: She is well-developed.  HENT:     Head: Normocephalic and atraumatic.  Eyes:     Conjunctiva/sclera: Conjunctivae normal.  Cardiovascular:     Rate and Rhythm: Normal rate and regular rhythm.     Heart sounds: Normal heart sounds.  Pulmonary:     Effort: Pulmonary effort is normal.     Breath sounds: Wheezing present.     Comments: Bilateral expiratory wheeze with prolonged expirations lower lung fields. Abdominal:     General: Bowel sounds are normal.     Palpations: Abdomen is soft.     Tenderness: There is no abdominal tenderness.  Musculoskeletal:        General: Normal range of motion.     Cervical back: Normal range of motion.  Skin:    General: Skin is warm and dry.  Neurological:     General: No focal deficit present.     Mental Status: She is alert.     ED Results / Procedures / Treatments   Labs (all labs ordered are listed, but only abnormal results are displayed) Labs Reviewed  RESP PANEL BY RT-PCR (RSV, FLU A&B, COVID)  [id number]    EKG None  Radiology DG Chest 2 View  Result Date: 12/22/2022 CLINICAL DATA:  Cough and congestion, history of asthma EXAM: CHEST - 2 VIEW COMPARISON:  Multiple chest x-rays, most recently August 17, 2022 FINDINGS: The heart size and mediastinal contours are within normal limits. Low lung volumes with bronchovascular crowding. Prominent interstitial pulmonary markings with scattered areas of bronchial wall thickening. No consolidative opacity. No pleural effusion or pneumothorax. Surgical clips projecting over the right chest wall. The visualized upper abdomen is unremarkable. No acute osseous abnormality. IMPRESSION: Findings suggestive of small airways disease without evidence of superimposed pneumonia. Electronically Signed   By: Beryle Flock M.D.   On:  12/22/2022 12:06    Procedures Procedures    Medications Ordered in ED Medications  predniSONE (DELTASONE) tablet 60 mg (60 mg Oral Given 12/22/22 1301)  albuterol (VENTOLIN HFA) 108 (90 Base) MCG/ACT inhaler 2 puff (2 puffs Inhalation Given 12/22/22 1302)    ED Course/ Medical Decision Making/ A&P                             Medical Decision Making Patient presenting with nasal congestion, cough and wheezing since been exposed to burning leaves 4 days ago.  On exam today she does have some mild expiratory wheeze, her breathing is otherwise comfortable with normal vital signs, specifically pulse ox at 100%.  Initially she was tachycardic with a pulse of 116, she states she was  anxious when she first arrived over concern about possible pneumonia, she denies palpitations, dizziness, chest pain.  Repeat vital signs are within normal range.  She was given an albuterol MDI, her wheezing resolved.  She has started on a prednisone pulse dose.  Planned as needed follow-up with her PCP or her pulmonologist for any ongoing or worsening symptoms.  Amount and/or Complexity of Data Reviewed Labs: ordered.    Details: Respiratory panel is negative Radiology: ordered and independent interpretation performed.    Details: X-ray reviewed and agree with interpretation, clear, no pneumonia.  Risk Prescription drug management.           Final Clinical Impression(s) / ED Diagnoses Final diagnoses:  Bronchitis    Rx / DC Orders ED Discharge Orders          Ordered    predniSONE (DELTASONE) 50 MG tablet        12/22/22 1302              Evalee Jefferson, PA-C 12/22/22 1308    Cristie Hem, MD 12/23/22 1304

## 2022-12-24 ENCOUNTER — Encounter (HOSPITAL_COMMUNITY): Payer: Self-pay | Admitting: Occupational Therapy

## 2022-12-24 ENCOUNTER — Ambulatory Visit (HOSPITAL_COMMUNITY): Payer: Medicaid Other | Admitting: Occupational Therapy

## 2022-12-24 DIAGNOSIS — R29898 Other symptoms and signs involving the musculoskeletal system: Secondary | ICD-10-CM

## 2022-12-24 DIAGNOSIS — M25612 Stiffness of left shoulder, not elsewhere classified: Secondary | ICD-10-CM

## 2022-12-24 DIAGNOSIS — M25512 Pain in left shoulder: Secondary | ICD-10-CM | POA: Diagnosis not present

## 2022-12-24 NOTE — Patient Instructions (Signed)

## 2022-12-24 NOTE — Therapy (Signed)
OUTPATIENT OCCUPATIONAL THERAPY ORTHO TREATMENT   Patient Name: Monica [name] MRN: [id number] DOB:11-Jul-1971, 52 y.o., female Today's Date: 12/24/2022  PCP: Royce Macadamia, PA-C REFERRING PROVIDER: Vanetta Mulders, MD  END OF SESSION:  OT End of Session - 12/24/22 0857     Visit Number 11    Number of Visits 20    Date for OT Re-Evaluation 01/22/23   mini-reassessment 12/23/22   Authorization Type [employer]    Authorization Time Period 12 visits approved 11/26/22-01/25/23    Authorization - Visit Number 10    Authorization - Number of Visits 12    OT Start Time 1607    OT Stop Time 0937    OT Time Calculation (min) 40 min    Activity Tolerance Patient tolerated treatment well    Behavior During Therapy [facility] for tasks assessed/performed              Past Medical History:  Diagnosis Date   Asthma    Back pain    Breast cancer ([place])    Buttock pain    Family history of breast cancer 09/22/2020   Hypertension    Personal history of chemotherapy    Personal history of radiation therapy    Past Surgical History:  Procedure Laterality Date   ABDOMINAL HYSTERECTOMY     APPENDECTOMY     BREAST LUMPECTOMY WITH RADIOACTIVE SEED AND SENTINEL LYMPH NODE BIOPSY Right 09/10/2020   Procedure: RIGHT BREAST LUMPECTOMY WITH RADIOACTIVE SEED AND SENTINEL [place];  Surgeon: Erroll Luna, MD;  Location: [place];  Service: General;  Laterality: Right;   PORTACATH PLACEMENT Right 10/16/2020   Procedure: INSERTION PORT-A-CATH WITH ULTRASOUND GUIDANCE;  Surgeon: Erroll Luna, MD;  Location: [place];  Service: General;  Laterality: Right;   RE-EXCISION OF BREAST LUMPECTOMY Right 10/16/2020   Procedure: RE-EXCISION OF RIGHT BREAST LUMPECTOMY;  Surgeon: Erroll Luna, MD;  Location: [place];  Service: General;  Laterality: Right;   REVERSE SHOULDER ARTHROPLASTY Left 11/16/2022   Procedure: LEFT REVERSE  SHOULDER ARTHROPLASTY;  Surgeon: Vanetta Mulders, MD;  Location: [place];  Service: Orthopedics;  Laterality: Left;   Patient Active Problem List   Diagnosis Date Noted   Primary osteoarthritis, left shoulder 11/16/2022   Spondylolisthesis at L5-S1 level 01/04/2022   Lumbar radiculopathy 01/04/2022   Trochanteric bursitis of left hip 11/10/2021   Muscle cramp, nocturnal 11/10/2021   Port-A-Cath in place 12/29/2020   Genetic testing 10/08/2020   Family history of breast cancer 09/22/2020   Primary malignant neoplasm of upper outer quadrant of female breast, right ([place]) 08/27/2020    ONSET DATE: 11/16/22  REFERRING DIAG: s/p left reverse TSA  THERAPY DIAG:  Acute pain of left shoulder  Stiffness of left shoulder, not elsewhere classified  Other symptoms and signs involving the musculoskeletal system  Rationale for Evaluation and Treatment: Rehabilitation  SUBJECTIVE:   SUBJECTIVE STATEMENT: S: "My arm still feels so heavy, I want that to go away"   PERTINENT HISTORY: Pt is s/p left reverse TSA on 11/16/22. Pt reports she has been wearing the blue sling and has not straightened her arm since the surgery. She has high pain and is fearful of movement due to potential pain. Pt was referred to occupational therapy for evaluation and treatment by Dr. Vanetta Mulders.   PRECAUTIONS: Shoulder; waiting on protocol from MD  WEIGHT BEARING RESTRICTIONS: Yes NWB  PAIN:  Are you having pain? Yes: NPRS scale: 5/10 Pain location: left  shoulder Pain description: sharp, toothache Aggravating factors: cold, rain Relieving factors: pain medication  FALLS: Has patient fallen in last 6 months? No  PATIENT GOALS: To be able to use the LUE as her non-dominant.   NEXT MD VISIT: 12/01/22  OBJECTIVE:   HAND DOMINANCE: Right  ADLs: Overall ADLs: Pt reports everything is difficult-dressing, putting her hair wrap on, cooking, sweeping, putting shoes on, bathing tasks. Pt  with difficulty sleeping, is sleeping on her couch.    FUNCTIONAL OUTCOME MEASURES: Quick Dash: 97.73   UPPER EXTREMITY ROM:     Passive ROM Left eval Left 12/21/22  Shoulder flexion 43 121  Shoulder abduction 42 136  Shoulder internal rotation 59 90  Shoulder external rotation -34 17  (Blank rows = not tested)      Active ROM Left eval Left 12/21/22  Shoulder flexion  116  Shoulder abduction  105  Shoulder internal rotation  90  Shoulder external rotation  7  (Blank rows = not tested)   UPPER EXTREMITY MMT:     MMT Left eval  Shoulder flexion   Shoulder abduction   Shoulder internal rotation   Shoulder external rotation   (Blank rows = not tested)  HAND FUNCTION: Grip strength: Right: 20 lbs; Left: 0 lbs  SENSATION: Pt reports tingling pins/needles in her left hand.   EDEMA: Intermittent   OBSERVATIONS: pt with max fascial restrictions in left upper arm, anterior shoulder, trapezius, and scapular regions. Pt with max guarding and fear of pain with movement.    TODAY'S TREATMENT:                                                                                                                              DATE:   12/23/22 -Manual therapy: myofascial release and trigger point applied to LUE at the pectoralis, biceps, trapezius, and scapular region in order to decrease fascial restriction throughout upper arm and posterior trap -AA/ROM: supine, 2lb dowel-flexion, protraction, horizontal abduction, er/IR, abduction, x10 -A/ROM: supine, flexion, protraction, horizontal abduction, er/IR, abduction, x5 -Scapular Strengthening: red theraband, extension, retraction, protraction, rows, x10  12/21/22 -Manual therapy: myofascial release and trigger point applied to LUE at the pectoralis, biceps, trapezius, and scapular region in order to decrease fascial restriction throughout upper arm and posterior trap -P/ROM: supine-flexion, abduction, horizontal abduction, er/IR,  x8 -AA/ROM: supine-flexion, protraction, horizontal abduction, er/IR, abduction, x10 -A/ROM: supine, flexion, protraction, horizontal abduction, er/IR, abduction, x5 -Ball Rolls up the wall: flexion, abduction, x8  12/17/22 -Manual therapy: myofascial release and trigger point applied to LUE at the pectoralis, biceps, trapezius, and scapular region in order to decrease fascial restriction throughout upper arm and posterior trap -P/ROM: supine-flexion, abduction, horizontal abduction, er/IR, x5 -AA/ROM: supine-flexion, protraction, horizontal abduction, er/IR, abduction, x10 -Wall Slides: flexion and abduction x10    PATIENT EDUCATION: Education details: English as a second language teacher Person educated: Patient and Parent Education method: Explanation, Demonstration, and Handouts Education comprehension: verbalized understanding and returned demonstration  HOME EXERCISE  PROGRAM: Eval: Pendulums, elbow AA/ROM, wrist A/ROM 12/26: Table Slides 1/2: AA/ROM 1/4: Isometrics 1/12: Wall Slides 1/16: A/ROM 1/19: Scapular Strengthening  GOALS: Goals reviewed with patient? Yes  SHORT TERM GOALS: Target date: 12/23/22  Pt will be provided with and educated on HEP to improve ability to use LUE as assist during ADL tasks.   Goal status: IN PROGRESS  2.  Pt will increase P/ROM of LUE to at least 50% ROM to improve ability to perform dressing tasks using compensatory strategies and minimial physical assistance.   Goal status: IN PROGRESS  3.  Pt will increase LUE strength to 3-/5 to improve ability to use LUE to reach items at waist height during ADLs such as eating or grooming.   Goal status: IN PROGRESS  4.  Pt will decrease LUE fascial restrictions to mod amounts or less to improve ability to perform low level functional reaching tasks and improve tolerance to HEP and ROM tasks.   Goal status: IN PROGRESS    LONG TERM GOALS: Target date: 01/23/23  Pt will decrease pain in LUE to 4/10 or  less to improve ability to sleep in the bed for at least 2 hours at night.   Goal status: IN PROGRESS  2.  Pt will increase A/ROM of LUE to at least 75% range to improve ability to reach up and operate her head wrap and reach back to adjust her pants.   Goal status: IN PROGRESS  3.  Pt will decrease LUE fascial restrictions to minimal amounts or less to improve ability to perform functional reaching tasks when getting items out of the cabinets during meal preparation or when getting items from a closet.  Goal status: IN PROGRESS  4.  Pt will increase LUE strength to a 4/5 or greater to improve ability to lift pots and pans and to perform housekeeping tasks.   Goal status: IN PROGRESS    ASSESSMENT:  CLINICAL IMPRESSION: This session pt is continuing to making good improvements with ROM and strengthening. She started scapular strengthening this session with minimal pain and mild difficulty with sustaining holds from the resistance and not rushing through the movements. Pt continues to present with good movement pattern and is able to reach 75-80% of full ROM during A/ROM exercises. She required multiple short breaks during exercises due to muscle fatigue during ROM tasks. OT providing verbal cuing for positioning and technique.    PLAN:  OT FREQUENCY: 3x/week  OT DURATION: 8 weeks  PLANNED INTERVENTIONS: self care/ADL training, therapeutic exercise, therapeutic activity, manual therapy, passive range of motion, splinting, electrical stimulation, patient/family education, and Re-evaluation  CONSULTED AND AGREED WITH PLAN OF CARE: Patient  PLAN FOR NEXT SESSION: Manual Therapy, AA/ROM, Isometrics, A/ROM, Pulleys, scapula Strengthening, proximal shoulder strengthening   Paulita Fujita, OTR/L [phone] 12/24/2022, 8:58 AM

## 2022-12-28 ENCOUNTER — Encounter (HOSPITAL_COMMUNITY): Payer: Self-pay | Admitting: Occupational Therapy

## 2022-12-28 ENCOUNTER — Ambulatory Visit (HOSPITAL_COMMUNITY): Payer: Medicaid Other | Admitting: Occupational Therapy

## 2022-12-28 DIAGNOSIS — M25612 Stiffness of left shoulder, not elsewhere classified: Secondary | ICD-10-CM

## 2022-12-28 DIAGNOSIS — R29898 Other symptoms and signs involving the musculoskeletal system: Secondary | ICD-10-CM

## 2022-12-28 DIAGNOSIS — M25512 Pain in left shoulder: Secondary | ICD-10-CM

## 2022-12-28 NOTE — Therapy (Signed)
OUTPATIENT OCCUPATIONAL THERAPY ORTHO TREATMENT   Patient Name: Monica [name] MRN: [id number] DOB:02-11-1971, 52 y.o., female Today's Date: 12/28/2022  PCP: Royce Macadamia, PA-C REFERRING PROVIDER: Vanetta Mulders, MD  END OF SESSION:  OT End of Session - 12/28/22 0902     Visit Number 12    Number of Visits 20    Date for OT Re-Evaluation 01/22/23   mini-reassessment 12/23/22   Authorization Type [employer]    Authorization Time Period 12 visits approved 11/26/22-01/25/23    Authorization - Visit Number 11    Authorization - Number of Visits 12    OT Start Time 0905    OT Stop Time 0945    OT Time Calculation (min) 40 min    Activity Tolerance Patient tolerated treatment well    Behavior During Therapy [facility] for tasks assessed/performed              Past Medical History:  Diagnosis Date   Asthma    Back pain    Breast cancer ([place])    Buttock pain    Family history of breast cancer 09/22/2020   Hypertension    Personal history of chemotherapy    Personal history of radiation therapy    Past Surgical History:  Procedure Laterality Date   ABDOMINAL HYSTERECTOMY     APPENDECTOMY     BREAST LUMPECTOMY WITH RADIOACTIVE SEED AND SENTINEL LYMPH NODE BIOPSY Right 09/10/2020   Procedure: RIGHT BREAST LUMPECTOMY WITH RADIOACTIVE SEED AND SENTINEL [place];  Surgeon: Erroll Luna, MD;  Location: [place];  Service: General;  Laterality: Right;   PORTACATH PLACEMENT Right 10/16/2020   Procedure: INSERTION PORT-A-CATH WITH ULTRASOUND GUIDANCE;  Surgeon: Erroll Luna, MD;  Location: [place];  Service: General;  Laterality: Right;   RE-EXCISION OF BREAST LUMPECTOMY Right 10/16/2020   Procedure: RE-EXCISION OF RIGHT BREAST LUMPECTOMY;  Surgeon: Erroll Luna, MD;  Location: [place];  Service: General;  Laterality: Right;   REVERSE SHOULDER ARTHROPLASTY Left 11/16/2022   Procedure: LEFT REVERSE  SHOULDER ARTHROPLASTY;  Surgeon: Vanetta Mulders, MD;  Location: [place];  Service: Orthopedics;  Laterality: Left;   Patient Active Problem List   Diagnosis Date Noted   Primary osteoarthritis, left shoulder 11/16/2022   Spondylolisthesis at L5-S1 level 01/04/2022   Lumbar radiculopathy 01/04/2022   Trochanteric bursitis of left hip 11/10/2021   Muscle cramp, nocturnal 11/10/2021   Port-A-Cath in place 12/29/2020   Genetic testing 10/08/2020   Family history of breast cancer 09/22/2020   Primary malignant neoplasm of upper outer quadrant of female breast, right ([place]) 08/27/2020    ONSET DATE: 11/16/22  REFERRING DIAG: s/p left reverse TSA  THERAPY DIAG:  Acute pain of left shoulder  Stiffness of left shoulder, not elsewhere classified  Other symptoms and signs involving the musculoskeletal system  Rationale for Evaluation and Treatment: Rehabilitation  SUBJECTIVE:   SUBJECTIVE STATEMENT: S: "My arm still feels so heavy, I want that to go away"   PERTINENT HISTORY: Pt is s/p left reverse TSA on 11/16/22. Pt reports she has been wearing the blue sling and has not straightened her arm since the surgery. She has high pain and is fearful of movement due to potential pain. Pt was referred to occupational therapy for evaluation and treatment by Dr. Vanetta Mulders.   PRECAUTIONS: Shoulder; waiting on protocol from MD  WEIGHT BEARING RESTRICTIONS: Yes NWB  PAIN:  Are you having pain? Yes: NPRS scale: 5/10 Pain location: left  shoulder Pain description: sharp, toothache Aggravating factors: cold, rain Relieving factors: pain medication  FALLS: Has patient fallen in last 6 months? No  PATIENT GOALS: To be able to use the LUE as her non-dominant.   OBJECTIVE:   HAND DOMINANCE: Right  ADLs: Overall ADLs: Pt reports everything is difficult-dressing, putting her hair wrap on, cooking, sweeping, putting shoes on, bathing tasks. Pt with difficulty sleeping, is  sleeping on her couch.    FUNCTIONAL OUTCOME MEASURES: Quick Dash: 97.73   UPPER EXTREMITY ROM:     Passive ROM Left eval Left 12/21/22  Shoulder flexion 43 121  Shoulder abduction 42 136  Shoulder internal rotation 59 90  Shoulder external rotation -34 17  (Blank rows = not tested)      Active ROM Left eval Left 12/21/22  Shoulder flexion  116  Shoulder abduction  105  Shoulder internal rotation  90  Shoulder external rotation  7  (Blank rows = not tested)   UPPER EXTREMITY MMT:     MMT Left eval  Shoulder flexion   Shoulder abduction   Shoulder internal rotation   Shoulder external rotation   (Blank rows = not tested)  HAND FUNCTION: Grip strength: Right: 20 lbs; Left: 0 lbs  SENSATION: Pt reports tingling pins/needles in her left hand.   EDEMA: Intermittent   OBSERVATIONS: pt with max fascial restrictions in left upper arm, anterior shoulder, trapezius, and scapular regions. Pt with max guarding and fear of pain with movement.    TODAY'S TREATMENT:                                                                                                                              DATE:   12/28/22 -Manual therapy: myofascial release and trigger point applied to LUE at the pectoralis, biceps, trapezius, and scapular region in order to decrease fascial restriction throughout upper arm and posterior trap -AA/ROM: supine, 2lb dowel-flexion, protraction, horizontal abduction, er/IR, abduction, x10 -A/ROM: supine, flexion, protraction, horizontal abduction, er/IR, abduction, x10 -AA/ROM: seated, 2lb dowel-flexion, protraction, horizontal abduction, er/IR, abduction, x10 -Ball Exercises: green medium ball, chest presses, shoulder flexion, circles to the right, x10  12/23/22 -Manual therapy: myofascial release and trigger point applied to LUE at the pectoralis, biceps, trapezius, and scapular region in order to decrease fascial restriction throughout upper arm and  posterior trap -AA/ROM: supine, 2lb dowel-flexion, protraction, horizontal abduction, er/IR, abduction, x10 -A/ROM: supine, flexion, protraction, horizontal abduction, er/IR, abduction, x5 -Scapular Strengthening: red theraband, extension, retraction, protraction, rows, x10  12/21/22 -Manual therapy: myofascial release and trigger point applied to LUE at the pectoralis, biceps, trapezius, and scapular region in order to decrease fascial restriction throughout upper arm and posterior trap -P/ROM: supine-flexion, abduction, horizontal abduction, er/IR, x8 -AA/ROM: supine-flexion, protraction, horizontal abduction, er/IR, abduction, x10 -A/ROM: supine, flexion, protraction, horizontal abduction, er/IR, abduction, x5 -Ball Rolls up the wall: flexion, abduction, x8    PATIENT EDUCATION: Education details: Review HEP Person educated: Patient and  Parent Education method: Explanation, Demonstration, and Handouts Education comprehension: verbalized understanding and returned demonstration  HOME EXERCISE PROGRAM: Eval: Pendulums, elbow AA/ROM, wrist A/ROM 12/26: Table Slides 1/2: AA/ROM 1/4: Isometrics 1/12: Wall Slides 1/16: A/ROM 1/19: Scapular Strengthening  GOALS: Goals reviewed with patient? Yes  SHORT TERM GOALS: Target date: 12/23/22  Pt will be provided with and educated on HEP to improve ability to use LUE as assist during ADL tasks.   Goal status: IN PROGRESS  2.  Pt will increase P/ROM of LUE to at least 50% ROM to improve ability to perform dressing tasks using compensatory strategies and minimial physical assistance.   Goal status: IN PROGRESS  3.  Pt will increase LUE strength to 3-/5 to improve ability to use LUE to reach items at waist height during ADLs such as eating or grooming.   Goal status: IN PROGRESS  4.  Pt will decrease LUE fascial restrictions to mod amounts or less to improve ability to perform low level functional reaching tasks and improve tolerance to  HEP and ROM tasks.   Goal status: IN PROGRESS    LONG TERM GOALS: Target date: 01/23/23  Pt will decrease pain in LUE to 4/10 or less to improve ability to sleep in the bed for at least 2 hours at night.   Goal status: IN PROGRESS  2.  Pt will increase A/ROM of LUE to at least 75% range to improve ability to reach up and operate her head wrap and reach back to adjust her pants.   Goal status: IN PROGRESS  3.  Pt will decrease LUE fascial restrictions to minimal amounts or less to improve ability to perform functional reaching tasks when getting items out of the cabinets during meal preparation or when getting items from a closet.  Goal status: IN PROGRESS  4.  Pt will increase LUE strength to a 4/5 or greater to improve ability to lift pots and pans and to perform housekeeping tasks.   Goal status: IN PROGRESS    ASSESSMENT:  CLINICAL IMPRESSION: Patient reporting 10/10 pain and aching this session. She is requiring increased time and rest breaks during all ROM exercises. Due to pain and stiffness, session focused on ROM and stretching, as well as manual therapy to address fascial restrictions and reduce pain. OT added therapy ball exercises, with medium un-weighted ball for ROM and ensure good movement pattern. Verbal and tactile cues provided for positioning and technique.   PLAN:  OT FREQUENCY: 3x/week  OT DURATION: 8 weeks  PLANNED INTERVENTIONS: self care/ADL training, therapeutic exercise, therapeutic activity, manual therapy, passive range of motion, splinting, electrical stimulation, patient/family education, and Re-evaluation  CONSULTED AND AGREED WITH PLAN OF CARE: Patient  PLAN FOR NEXT SESSION: Manual Therapy, AA/ROM, Isometrics, A/ROM, Pulleys, scapula Strengthening, proximal shoulder strengthening, therapy ball exercises   Paulita Fujita, OTR/L [phone] 12/28/2022, 9:03 AM

## 2022-12-29 ENCOUNTER — Ambulatory Visit (INDEPENDENT_AMBULATORY_CARE_PROVIDER_SITE_OTHER): Payer: Medicaid Other | Admitting: Orthopaedic Surgery

## 2022-12-29 DIAGNOSIS — M19012 Primary osteoarthritis, left shoulder: Secondary | ICD-10-CM

## 2022-12-29 NOTE — Progress Notes (Signed)
Post Operative Evaluation    Procedure/Date of Surgery: Left reverse shoulder arthroplasty 12/12  Interval History:    Presents today 6 weeks status post left reverse shoulder replacement.  Her overhead range of motion is improving.  She does state that there is a heaviness to the arm as well as a burning over the lateral forearm.  Overall she continues to improve   PMH/PSH/Family History/Social History/Meds/Allergies:    Past Medical History:  Diagnosis Date   Asthma    Back pain    Breast cancer ([place])    Buttock pain    Family history of breast cancer 09/22/2020   Hypertension    Personal history of chemotherapy    Personal history of radiation therapy    Past Surgical History:  Procedure Laterality Date   ABDOMINAL HYSTERECTOMY     APPENDECTOMY     BREAST LUMPECTOMY WITH RADIOACTIVE SEED AND SENTINEL LYMPH NODE BIOPSY Right 09/10/2020   Procedure: RIGHT BREAST LUMPECTOMY WITH RADIOACTIVE SEED AND SENTINEL LYMPH NODE MAPPING;  Surgeon: Erroll Luna, MD;  Location: [place];  Service: General;  Laterality: Right;   PORTACATH PLACEMENT Right 10/16/2020   Procedure: INSERTION PORT-A-CATH WITH ULTRASOUND GUIDANCE;  Surgeon: Erroll Luna, MD;  Location: [place];  Service: General;  Laterality: Right;   RE-EXCISION OF BREAST LUMPECTOMY Right 10/16/2020   Procedure: RE-EXCISION OF RIGHT BREAST LUMPECTOMY;  Surgeon: Erroll Luna, MD;  Location: [place];  Service: General;  Laterality: Right;   REVERSE SHOULDER ARTHROPLASTY Left 11/16/2022   Procedure: LEFT REVERSE SHOULDER ARTHROPLASTY;  Surgeon: Vanetta Mulders, MD;  Location: [place];  Service: Orthopedics;  Laterality: Left;   Social History   Socioeconomic History   Marital status: Single    Spouse name: Not on file   Number of children: Not on file   Years of education: Not on file   Highest education level:  Not on file  Occupational History   Not on file  Tobacco Use   Smoking status: Former    Packs/day: 0.50    Years: 35.00    Total pack years: 17.50    Types: Cigarettes    Quit date: 08/22/2020    Years since quitting: 2.3   Smokeless tobacco: Never  Vaping Use   Vaping Use: Never used  Substance and Sexual Activity   Alcohol use: No   Drug use: Never   Sexual activity: Not Currently    Birth control/protection: Surgical  Other Topics Concern   Not on file  Social History Narrative   Not on file   Social Determinants of Health   Financial Resource Strain: Not on file  Food Insecurity: Not on file  Transportation Needs: Not on file  Physical Activity: Not on file  Stress: Not on file  Social Connections: Not on file   Family History  Problem Relation Age of Onset   Hypertension Father    Hypertension Sister    Breast cancer Maternal Aunt 21   Breast cancer Paternal Aunt        dx at unknown age   Hypertension Maternal Grandmother    Breast cancer Other        MGM's niece; dx 12s   Breast cancer Other        MGF's niece   Allergies  Allergen Reactions   Oxycodone Itching and Swelling   Current Outpatient Medications  Medication Sig Dispense Refill   ADVAIR HFA 115-21 MCG/ACT inhaler Inhale 2 puffs into the lungs 2 (two) times daily.     albuterol (VENTOLIN HFA) 108 (90 Base) MCG/ACT inhaler Inhale 2 puffs into the lungs every 4 (four) hours as needed for wheezing or shortness of breath. 8 g 3   aspirin EC 325 MG tablet Take 1 tablet (325 mg total) by mouth daily. 30 tablet 0   baclofen (LIORESAL) 10 MG tablet Take 1 tablet (10 mg total) by mouth at bedtime as needed for muscle spasms. 90 each 1   budesonide-formoterol (SYMBICORT) 160-4.5 MCG/ACT inhaler Inhale 2 puffs into the lungs in the morning and at bedtime. 10.2 g 5   calcium citrate (CALCITRATE - DOSED IN MG ELEMENTAL CALCIUM) 950 (200 Ca) MG tablet Take 200 mg of elemental calcium by mouth daily.      Cholecalciferol (VITAMIN D3) 125 MCG (5000 UT) TABS Take 1 tablet by mouth daily.     clonazePAM (KLONOPIN) 0.5 MG tablet Take 0.5 mg by mouth daily as needed.     escitalopram (LEXAPRO) 10 MG tablet Take 10 mg by mouth daily.     gabapentin (NEURONTIN) 300 MG capsule Take 1 capsule (300 mg total) by mouth 3 (three) times daily. 90 capsule 2   HYDROcodone-acetaminophen (NORCO/VICODIN) 5-325 MG tablet Take 1 tablet by mouth every 6 (six) hours as needed for moderate pain. 20 tablet 0   HYDROcodone-acetaminophen (NORCO/VICODIN) 5-325 MG tablet Take 1 tablet by mouth every 6 (six) hours as needed. 15 tablet 0   letrozole (FEMARA) 2.5 MG tablet TAKE 1 TABLET ONCE DAILY. 90 tablet 3   lisinopril (ZESTRIL) 10 MG tablet Take 1 tablet (10 mg total) by mouth daily.     oxyCODONE (OXY IR/ROXICODONE) 5 MG immediate release tablet Take 1 tablet (5 mg total) by mouth every 4 (four) hours as needed (severe pain). 20 tablet 0   prazosin (MINIPRESS) 1 MG capsule Take 1 mg by mouth at bedtime.     predniSONE (DELTASONE) 50 MG tablet Take one tablet daily for 4 days 4 tablet 0   No current facility-administered medications for this visit.   No results found.  Review of Systems:   A ROS was performed including pertinent positives and negatives as documented in the HPI.   Musculoskeletal Exam:    There were no vitals taken for this visit.  Left incision is well-appearing without erythema or drainage.  While standing she is able to forward elevate to 100 degrees.  External rotation is to 30 degrees in the sitting position internal rotation to back pocket.  There is some numbness in the musculocutaneous distribution although she is able to fire all 3 heads of the deltoid.  Imaging:      I personally reviewed and interpreted the radiographs.   Assessment:   52 year old female who is 6-week status post left reverse shoulder arthroplasty.  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  At this time we will plan to continue to watch this for improvement.  She does not have any axillary symptoms at this time.  Plan :    -Return to clinic in in 6 weeks      I personally saw and evaluated the patient, and participated in the management and treatment plan.  Vanetta Mulders, MD Attending Physician, Orthopedic Surgery  This document was dictated  using Systems analyst. A reasonable attempt at proof reading has been made to minimize errors.

## 2022-12-31 ENCOUNTER — Encounter (HOSPITAL_COMMUNITY): Payer: Self-pay | Admitting: Occupational Therapy

## 2022-12-31 ENCOUNTER — Ambulatory Visit (HOSPITAL_COMMUNITY): Payer: Medicaid Other | Admitting: Occupational Therapy

## 2022-12-31 DIAGNOSIS — R29898 Other symptoms and signs involving the musculoskeletal system: Secondary | ICD-10-CM

## 2022-12-31 DIAGNOSIS — M25612 Stiffness of left shoulder, not elsewhere classified: Secondary | ICD-10-CM

## 2022-12-31 DIAGNOSIS — M25512 Pain in left shoulder: Secondary | ICD-10-CM | POA: Diagnosis not present

## 2022-12-31 NOTE — Therapy (Signed)
[facility]REAT[facility]  MINI-REASSESSMENT  Patient Name: Monica [name] MRN: [id number] DOB:01-17-1971, 52 y.o., female Today's Date: 12/31/2022  PCP: Royce Macadamia, PA-C REFERRING PROVIDER: Vanetta Mulders, MD  END OF SESSION:  OT End of Session - 12/31/22 0942     Visit Number 13    Number of Visits 20    Date for OT Re-Evaluation 01/22/23    Authorization Type [employer]    Authorization Time Period 12 visits approved 11/26/22-01/25/23; requesting 6 additional visits    Authorization - Visit Number 12    Authorization - Number of Visits 12    OT Start Time 0900    OT Stop Time 0941    OT Time Calculation (min) 41 min    Activity Tolerance Patient tolerated treatment well    Behavior During Therapy [facility] for tasks assessed/performed               Past Medical History:  Diagnosis Date   Asthma    Back pain    Breast cancer ([place])    Buttock pain    Family history of breast cancer 09/22/2020   Hypertension    Personal history of chemotherapy    Personal history of radiation therapy    Past Surgical History:  Procedure Laterality Date   ABDOMINAL HYSTERECTOMY     APPENDECTOMY     BREAST LUMPECTOMY WITH RADIOACTIVE SEED AND SENTINEL LYMPH NODE BIOPSY Right 09/10/2020   Procedure: RIGHT BREAST LUMPECTOMY WITH RADIOACTIVE SEED AND SENTINEL [place];  Surgeon: Erroll Luna, MD;  Location: [place];  Service: General;  Laterality: Right;   PORTACATH PLACEMENT Right 10/16/2020   Procedure: INSERTION PORT-A-CATH WITH ULTRASOUND GUIDANCE;  Surgeon: Erroll Luna, MD;  Location: [place];  Service: General;  Laterality: Right;   RE-EXCISION OF BREAST LUMPECTOMY Right 10/16/2020   Procedure: RE-EXCISION OF RIGHT BREAST LUMPECTOMY;  Surgeon: Erroll Luna, MD;  Location: [place];  Service: General;  Laterality: Right;   REVERSE SHOULDER ARTHROPLASTY Left 11/16/2022    Procedure: LEFT REVERSE SHOULDER ARTHROPLASTY;  Surgeon: Vanetta Mulders, MD;  Location: [place];  Service: Orthopedics;  Laterality: Left;   Patient Active Problem List   Diagnosis Date Noted   Primary osteoarthritis, left shoulder 11/16/2022   Spondylolisthesis at L5-S1 level 01/04/2022   Lumbar radiculopathy 01/04/2022   Trochanteric bursitis of left hip 11/10/2021   Muscle cramp, nocturnal 11/10/2021   Port-A-Cath in place 12/29/2020   Genetic testing 10/08/2020   Family history of breast cancer 09/22/2020   Primary malignant neoplasm of upper outer quadrant of female breast, right ([place]) 08/27/2020    ONSET DATE: 11/16/22  REFERRING DIAG: s/p left reverse TSA  THERAPY DIAG:  Acute pain of left shoulder  Stiffness of left shoulder, not elsewhere classified  Other symptoms and signs involving the musculoskeletal system  Rationale for Evaluation and Treatment: Rehabilitation  SUBJECTIVE:   SUBJECTIVE STATEMENT: S: "I went to the doctor Wednesday and he wants my arm to come up farther."  PERTINENT HISTORY: Pt is s/p left reverse TSA on 11/16/22. Pt reports she has been wearing the blue sling and has not straightened her arm since the surgery. She has high pain and is fearful of movement due to potential pain. Pt was referred to occupational therapy for evaluation and treatment by Dr. Vanetta Mulders.   PRECAUTIONS: Shoulder; AA/ROM and progress as tolerated  WEIGHT BEARING RESTRICTIONS: Yes NWB  PAIN:  Are you having pain? No  FALLS: Has patient fallen in last 6 months? No  PATIENT GOALS: To be able to use the LUE as her non-dominant.   OBJECTIVE:   HAND DOMINANCE: Right  ADLs: Overall ADLs: Pt reports everything is difficult-dressing, putting her hair wrap on, cooking, sweeping, putting shoes on, bathing tasks. Pt with difficulty sleeping, is sleeping on her couch.    FUNCTIONAL OUTCOME MEASURES: Quick Dash: 97.73 12/31/22: 70.45   UPPER  EXTREMITY ROM:     Passive ROM Left eval Left 12/21/22 Left 12/31/22  Shoulder flexion 43 121 135  Shoulder abduction 42 136 130  Shoulder internal rotation 59 90 90  Shoulder external rotation -34 17 45  (Blank rows = not tested)      Active ROM Left eval Left 12/21/22 Left 12/31/22  Shoulder flexion  116 104  Shoulder abduction  105 78  Shoulder internal rotation  90 90  Shoulder external rotation  7 25  (Blank rows = not tested)   UPPER EXTREMITY MMT:     MMT Left eval Left 12/31/22  Shoulder flexion  3-/5  Shoulder abduction  3-/5  Shoulder internal rotation  3/5  Shoulder external rotation  3-/5  (Blank rows = not tested)  HAND FUNCTION: Grip strength: Right: 20 lbs; Left: 0 lbs  SENSATION: Pt reports tingling pins/needles in her left hand.   EDEMA: Intermittent   OBSERVATIONS: pt with max fascial restrictions in left upper arm, anterior shoulder, trapezius, and scapular regions. Pt with max guarding and fear of pain with movement.  12/31/22: mod fascial restrictions, one large muscle knot at upper trapezius   TODAY'S TREATMENT:                                                                                                                              DATE:   12/31/22 -Manual therapy: myofascial release and trigger point applied to LUE at the pectoralis, biceps, trapezius, and scapular region in order to decrease fascial restriction throughout upper arm and posterior trap -P/ROM: flexion, abduction, er/IR, horizontal abduction, 5 reps each -A/ROM: supine, flexion, protraction, horizontal abduction, er/IR, abduction, x10 -Shoulder stretches: flexion, abduction, er, doorway stretch, IR behind back with horizontal towel 2x20" holds.   12/28/22 -Manual therapy: myofascial release and trigger point applied to LUE at the pectoralis, biceps, trapezius, and scapular region in order to decrease fascial restriction throughout upper arm and posterior trap -AA/ROM:  supine, 2lb dowel-flexion, protraction, horizontal abduction, er/IR, abduction, x10 -A/ROM: supine, flexion, protraction, horizontal abduction, er/IR, abduction, x10 -AA/ROM: seated, 2lb dowel-flexion, protraction, horizontal abduction, er/IR, abduction, x10 -Ball Exercises: green medium ball, chest presses, shoulder flexion, circles to the right, x10  12/23/22 -Manual therapy: myofascial release and trigger point applied to LUE at the pectoralis, biceps, trapezius, and scapular region in order to decrease fascial restriction throughout upper arm and posterior trap -AA/ROM: supine, 2lb dowel-flexion, protraction, horizontal abduction, er/IR, abduction, x10 -A/ROM: supine, flexion, protraction, horizontal abduction, er/IR, abduction, x5 -Scapular Strengthening: red  theraband, extension, retraction, protraction, rows, x10   PATIENT EDUCATION: Education details:  Person educated: Patient and Parent Education method: Consulting civil engineer, Media planner, and Handouts Education comprehension: verbalized understanding and returned demonstration  HOME EXERCISE PROGRAM: Eval: Pendulums, elbow AA/ROM, wrist A/ROM 12/26: Table Slides 1/2: AA/ROM 1/4: Isometrics 1/12: Wall Slides 1/16: A/ROM 1/19: Scapular Strengthening  GOALS: Goals reviewed with patient? Yes  SHORT TERM GOALS: Target date: 12/23/22  Pt will be provided with and educated on HEP to improve ability to use LUE as assist during ADL tasks.   Goal status: IN PROGRESS  2.  Pt will increase P/ROM of LUE to at least 50% ROM to improve ability to perform dressing tasks using compensatory strategies and minimial physical assistance.   Goal status: IN PROGRESS  3.  Pt will increase LUE strength to 3-/5 to improve ability to use LUE to reach items at waist height during ADLs such as eating or grooming.   Goal status: MET  4.  Pt will decrease LUE fascial restrictions to mod amounts or less to improve ability to perform low level functional  reaching tasks and improve tolerance to HEP and ROM tasks.   Goal status: MET    LONG TERM GOALS: Target date: 01/23/23  Pt will decrease pain in LUE to 4/10 or less to improve ability to sleep in the bed for at least 2 hours at night.   Goal status: IN PROGRESS  2.  Pt will increase A/ROM of LUE to at least 75% range to improve ability to reach up and operate her head wrap and reach back to adjust her pants.   Goal status: IN PROGRESS  3.  Pt will decrease LUE fascial restrictions to minimal amounts or less to improve ability to perform functional reaching tasks when getting items out of the cabinets during meal preparation or when getting items from a closet.  Goal status: IN PROGRESS  4.  Pt will increase LUE strength to a 4/5 or greater to improve ability to lift pots and pans and to perform housekeeping tasks.   Goal status: IN PROGRESS    ASSESSMENT:  CLINICAL IMPRESSION: Patient reporting she went to the doctor and he recommended trying Aspercream. Mini-reassessment completed today to request additional visits from insurance. Pt has met 2 STGs and is making progress with ROM and strength towards remaining goals. OT notes significant muscles knot in trapezius and pt with consistent trapezius hiking with ROM exercises. OT cuing for joint distraction during tasks to counter the trapezius activation. Pt is now only wearing sling when she is out in the community, discussed beginning to incorporate the LUE into simple ADLs. OT removed abduction pillow from sling as pt is not wearing appropriately. Added shoulder stretches today, doorway stretch was the most difficult. Verbal cuing for form and technique.    PLAN:  OT FREQUENCY: 2x/week  OT DURATION: 4 weeks  PLANNED INTERVENTIONS: self care/ADL training, therapeutic exercise, therapeutic activity, manual therapy, passive range of motion, splinting, electrical stimulation, patient/family education, and Re-evaluation  CONSULTED  AND AGREED WITH PLAN OF CARE: Patient  PLAN FOR NEXT SESSION: continue with skilled OT services focusing on manual techniques, AA/ROM and A/ROM, scapular and shoulder stability, shoulder stretches, and facilitating movement required for ADL completion. Update HEP for shoulder stretches if pt completing with good form.    Guadelupe Sabin, OTR/L  [phone] 12/31/2022, 9:42 AM

## 2023-01-03 ENCOUNTER — Encounter (HOSPITAL_COMMUNITY): Payer: Self-pay | Admitting: Occupational Therapy

## 2023-01-03 ENCOUNTER — Ambulatory Visit (HOSPITAL_COMMUNITY): Payer: Medicaid Other | Admitting: Occupational Therapy

## 2023-01-03 DIAGNOSIS — M25512 Pain in left shoulder: Secondary | ICD-10-CM | POA: Diagnosis not present

## 2023-01-03 DIAGNOSIS — M25612 Stiffness of left shoulder, not elsewhere classified: Secondary | ICD-10-CM

## 2023-01-03 DIAGNOSIS — R29898 Other symptoms and signs involving the musculoskeletal system: Secondary | ICD-10-CM

## 2023-01-03 NOTE — Therapy (Signed)
OUTPATIENT OCCUPATIONAL THERAPY ORTHO TREATMENT    Patient Name: Monica [name] MRN: [id number] DOB:February 25, 1971, 52 y.o., female Today's Date: 01/03/2023  PCP: Royce Macadamia, PA-C REFERRING PROVIDER: Vanetta Mulders, MD  END OF SESSION:  OT End of Session - 01/03/23 1048     Visit Number 14    Number of Visits 20    Date for OT Re-Evaluation 01/22/23    Authorization Type [employer]    Authorization Time Period 12 visits approved 11/26/22-01/25/23; 6 additional visits approved 1/29-3/15/24    Authorization - Visit Number 1    Authorization - Number of Visits 6    OT Start Time 1030    OT Stop Time 1110    OT Time Calculation (min) 40 min    Activity Tolerance Patient tolerated treatment well    Behavior During Therapy WFL for tasks assessed/performed                Past Medical History:  Diagnosis Date   Asthma    Back pain    Breast cancer ([place])    Buttock pain    Family history of breast cancer 09/22/2020   Hypertension    Personal history of chemotherapy    Personal history of radiation therapy    Past Surgical History:  Procedure Laterality Date   ABDOMINAL HYSTERECTOMY     APPENDECTOMY     BREAST LUMPECTOMY WITH RADIOACTIVE SEED AND SENTINEL LYMPH NODE BIOPSY Right 09/10/2020   Procedure: RIGHT BREAST LUMPECTOMY WITH RADIOACTIVE SEED AND SENTINEL [place];  Surgeon: Erroll Luna, MD;  Location: [place];  Service: General;  Laterality: Right;   PORTACATH PLACEMENT Right 10/16/2020   Procedure: INSERTION PORT-A-CATH WITH ULTRASOUND GUIDANCE;  Surgeon: Erroll Luna, MD;  Location: [place];  Service: General;  Laterality: Right;   RE-EXCISION OF BREAST LUMPECTOMY Right 10/16/2020   Procedure: RE-EXCISION OF RIGHT BREAST LUMPECTOMY;  Surgeon: Erroll Luna, MD;  Location: [place];  Service: General;  Laterality: Right;   REVERSE SHOULDER ARTHROPLASTY Left 11/16/2022    Procedure: LEFT REVERSE SHOULDER ARTHROPLASTY;  Surgeon: Vanetta Mulders, MD;  Location: [place];  Service: Orthopedics;  Laterality: Left;   Patient Active Problem List   Diagnosis Date Noted   Primary osteoarthritis, left shoulder 11/16/2022   Spondylolisthesis at L5-S1 level 01/04/2022   Lumbar radiculopathy 01/04/2022   Trochanteric bursitis of left hip 11/10/2021   Muscle cramp, nocturnal 11/10/2021   Port-A-Cath in place 12/29/2020   Genetic testing 10/08/2020   Family history of breast cancer 09/22/2020   Primary malignant neoplasm of upper outer quadrant of female breast, right ([place]) 08/27/2020    ONSET DATE: 11/16/22  REFERRING DIAG: s/p left reverse TSA  THERAPY DIAG:  Acute pain of left shoulder  Stiffness of left shoulder, not elsewhere classified  Other symptoms and signs involving the musculoskeletal system  Rationale for Evaluation and Treatment: Rehabilitation  SUBJECTIVE:   SUBJECTIVE STATEMENT: S: "I took pain medicine at 6am this morning."  PERTINENT HISTORY: Pt is s/p left reverse TSA on 11/16/22. Pt reports she has been wearing the blue sling and has not straightened her arm since the surgery. She has high pain and is fearful of movement due to potential pain. Pt was referred to occupational therapy for evaluation and treatment by Dr. Vanetta Mulders.   PRECAUTIONS: Shoulder; AA/ROM and progress as tolerated  WEIGHT BEARING RESTRICTIONS: Yes NWB  PAIN:  Are you having pain? No  FALLS: Has patient fallen in  last 6 months? No  PATIENT GOALS: To be able to use the LUE as her non-dominant.   OBJECTIVE:   HAND DOMINANCE: Right  ADLs: Overall ADLs: Pt reports everything is difficult-dressing, putting her hair wrap on, cooking, sweeping, putting shoes on, bathing tasks. Pt with difficulty sleeping, is sleeping on her couch.    FUNCTIONAL OUTCOME MEASURES: Quick Dash: 97.73 12/31/22: 70.45   UPPER EXTREMITY ROM:     Passive ROM  Left eval Left 12/21/22 Left 12/31/22  Shoulder flexion 43 121 135  Shoulder abduction 42 136 130  Shoulder internal rotation 59 90 90  Shoulder external rotation -34 17 45  (Blank rows = not tested)      Active ROM Left eval Left 12/21/22 Left 12/31/22  Shoulder flexion  116 104  Shoulder abduction  105 78  Shoulder internal rotation  90 90  Shoulder external rotation  7 25  (Blank rows = not tested)   UPPER EXTREMITY MMT:     MMT Left eval Left 12/31/22  Shoulder flexion  3-/5  Shoulder abduction  3-/5  Shoulder internal rotation  3/5  Shoulder external rotation  3-/5  (Blank rows = not tested)  HAND FUNCTION: Grip strength: Right: 20 lbs; Left: 0 lbs  SENSATION: Pt reports tingling pins/needles in her left hand.   EDEMA: Intermittent   OBSERVATIONS: pt with max fascial restrictions in left upper arm, anterior shoulder, trapezius, and scapular regions. Pt with max guarding and fear of pain with movement.  12/31/22: mod fascial restrictions, one large muscle knot at upper trapezius   TODAY'S TREATMENT:                                                                                                                              DATE:  01/03/23 -Manual therapy: myofascial release and trigger point applied to LUE at the pectoralis, biceps, trapezius, and scapular region in order to decrease fascial restriction throughout upper arm and posterior trap -P/ROM: flexion, abduction, er/IR, horizontal abduction, 5 reps each -A/ROM: supine, flexion, protraction, horizontal abduction, er/IR, abduction, x12 -Proximal shoulder strengthening: supine-paddles, criss cross, circles each direction, 10x each -Shoulder stretches: flexion, abduction, er, doorway stretch, IR behind back with horizontal towel 2x20" holds.  -Functional reaching: Pt reaching up into flexion to remove items from middle shelf of kitchen cabinet, then replaced in abduction  12/31/22 -Manual therapy: myofascial  release and trigger point applied to LUE at the pectoralis, biceps, trapezius, and scapular region in order to decrease fascial restriction throughout upper arm and posterior trap -P/ROM: flexion, abduction, er/IR, horizontal abduction, 5 reps each -A/ROM: supine, flexion, protraction, horizontal abduction, er/IR, abduction, x10 -Shoulder stretches: flexion, abduction, er, doorway stretch, IR behind back with horizontal towel 2x20" holds.   12/28/22 -Manual therapy: myofascial release and trigger point applied to LUE at the pectoralis, biceps, trapezius, and scapular region in order to decrease fascial restriction throughout upper arm and posterior trap -AA/ROM: supine, 2lb dowel-flexion, protraction, horizontal  abduction, er/IR, abduction, x10 -A/ROM: supine, flexion, protraction, horizontal abduction, er/IR, abduction, x10 -AA/ROM: seated, 2lb dowel-flexion, protraction, horizontal abduction, er/IR, abduction, x10 -Ball Exercises: green medium ball, chest presses, shoulder flexion, circles to the right, x10   PATIENT EDUCATION: Education details: shoulder stretches Person educated: Patient and Parent Education method: Explanation, Demonstration, and Handouts Education comprehension: verbalized understanding and returned demonstration  HOME EXERCISE PROGRAM: Eval: Pendulums, elbow AA/ROM, wrist A/ROM 12/26: Table Slides 1/2: AA/ROM 1/4: Isometrics 1/12: Wall Slides 1/16: A/ROM 1/19: Scapular Strengthening 1/29: shoulder stretches  GOALS: Goals reviewed with patient? Yes  SHORT TERM GOALS: Target date: 12/23/22  Pt will be provided with and educated on HEP to improve ability to use LUE as assist during ADL tasks.   Goal status: IN PROGRESS  2.  Pt will increase P/ROM of LUE to at least 50% ROM to improve ability to perform dressing tasks using compensatory strategies and minimial physical assistance.   Goal status: IN PROGRESS  3.  Pt will increase LUE strength to 3-/5 to  improve ability to use LUE to reach items at waist height during ADLs such as eating or grooming.   Goal status: MET  4.  Pt will decrease LUE fascial restrictions to mod amounts or less to improve ability to perform low level functional reaching tasks and improve tolerance to HEP and ROM tasks.   Goal status: MET    LONG TERM GOALS: Target date: 01/23/23  Pt will decrease pain in LUE to 4/10 or less to improve ability to sleep in the bed for at least 2 hours at night.   Goal status: IN PROGRESS  2.  Pt will increase A/ROM of LUE to at least 75% range to improve ability to reach up and operate her head wrap and reach back to adjust her pants.   Goal status: IN PROGRESS  3.  Pt will decrease LUE fascial restrictions to minimal amounts or less to improve ability to perform functional reaching tasks when getting items out of the cabinets during meal preparation or when getting items from a closet.  Goal status: IN PROGRESS  4.  Pt will increase LUE strength to a 4/5 or greater to improve ability to lift pots and pans and to perform housekeeping tasks.   Goal status: IN PROGRESS    ASSESSMENT:  CLINICAL IMPRESSION: Patient reports she woke up with aching this morning and took pain medication, no pain currently. Continued with myofascial release, trigger point release to left upper trapezius. Passive stretching completed, continued A/ROM in supine, shoulder stretches in standing. Added functional reaching task, pt with greater ease in abduction plane than flexion plane. Added shoulder stretches to HEP and instructed pt to be completing stretches and A/ROM. Verbal cuing for form and technique during exercises.    PLAN:  OT FREQUENCY: 2x/week  OT DURATION: 4 weeks  PLANNED INTERVENTIONS: self care/ADL training, therapeutic exercise, therapeutic activity, manual therapy, passive range of motion, splinting, electrical stimulation, patient/family education, and  Re-evaluation  CONSULTED AND AGREED WITH PLAN OF CARE: Patient  PLAN FOR NEXT SESSION: follow up on HEP, continue with A/ROM progressing to standing, add overhead lacing and attempt ball on wall  Guadelupe Sabin, OTR/L  [phone] 01/03/2023, 11:10 AM

## 2023-01-03 NOTE — Patient Instructions (Signed)
  1) Flexion Wall Stretch    Face wall, place affected handon wall in front of you. Slide hand up the wall  and lean body in towards the wall. Hold for 10 seconds. Repeat 3-5 times. 1-2 times/day.     2) Towel Stretch with Internal Rotation      Gently pull up (or to the side) your affected arm  behind your back with the assist of a towel. Hold 10 seconds, repeat 3-5 times. 1-2 times/day.             3) Corner Stretch    Stand at a corner of a wall, place your arms on the walls with elbows bent. Lean into the corner until a stretch is felt along the front of your chest and/or shoulders. Hold for 10 seconds. Repeat 3-5X, 1-2 times/day.    4) Posterior Capsule Stretch    Bring the involved arm across chest. Grasp elbow and pull toward chest until you feel a stretch in the back of the upper arm and shoulder. Hold 10 seconds. Repeat 3-5X. Complete 1-2 times/day.    5) Scapular Retraction    Tuck chin back as you pinch shoulder blades together.  Hold 5 seconds. Repeat 3-5X. Complete 1-2 times/day.    6) External Rotation Stretch:     Place your affected hand on the wall with the elbow bent and gently turn your body the opposite direction until a stretch is felt. Hold 10 seconds, repeat 3-5X. Complete 1-2 times/day.

## 2023-01-06 ENCOUNTER — Ambulatory Visit (HOSPITAL_COMMUNITY): Payer: Medicaid Other | Attending: *Deleted | Admitting: Occupational Therapy

## 2023-01-06 ENCOUNTER — Encounter (HOSPITAL_COMMUNITY): Payer: Self-pay | Admitting: Occupational Therapy

## 2023-01-06 DIAGNOSIS — M25512 Pain in left shoulder: Secondary | ICD-10-CM | POA: Insufficient documentation

## 2023-01-06 DIAGNOSIS — M25612 Stiffness of left shoulder, not elsewhere classified: Secondary | ICD-10-CM | POA: Diagnosis present

## 2023-01-06 DIAGNOSIS — R29898 Other symptoms and signs involving the musculoskeletal system: Secondary | ICD-10-CM | POA: Diagnosis present

## 2023-01-06 NOTE — Therapy (Signed)
OUTPATIENT OCCUPATIONAL THERAPY ORTHO TREATMENT    Patient Name: Monica [name] MRN: [id number] DOB:1971/11/25, 52 y.o., female Today's Date: 01/03/2023  PCP: Royce Macadamia, PA-C REFERRING PROVIDER: Vanetta Mulders, MD  END OF SESSION:  OT End of Session - 01/03/23 1048     Visit Number 14    Number of Visits 20    Date for OT Re-Evaluation 01/22/23    Authorization Type [employer]    Authorization Time Period 12 visits approved 11/26/22-01/25/23; 6 additional visits approved 1/29-3/15/24    Authorization - Visit Number 1    Authorization - Number of Visits 6    OT Start Time 1030    OT Stop Time 1110    OT Time Calculation (min) 40 min    Activity Tolerance Patient tolerated treatment well    Behavior During Therapy WFL for tasks assessed/performed             Past Medical History:  Diagnosis Date   Asthma    Back pain    Breast cancer ([place])    Buttock pain    Family history of breast cancer 09/22/2020   Hypertension    Personal history of chemotherapy    Personal history of radiation therapy    Past Surgical History:  Procedure Laterality Date   ABDOMINAL HYSTERECTOMY     APPENDECTOMY     BREAST LUMPECTOMY WITH RADIOACTIVE SEED AND SENTINEL LYMPH NODE BIOPSY Right 09/10/2020   Procedure: RIGHT BREAST LUMPECTOMY WITH RADIOACTIVE SEED AND SENTINEL [place];  Surgeon: Erroll Luna, MD;  Location: [place];  Service: General;  Laterality: Right;   PORTACATH PLACEMENT Right 10/16/2020   Procedure: INSERTION PORT-A-CATH WITH ULTRASOUND GUIDANCE;  Surgeon: Erroll Luna, MD;  Location: [place];  Service: General;  Laterality: Right;   RE-EXCISION OF BREAST LUMPECTOMY Right 10/16/2020   Procedure: RE-EXCISION OF RIGHT BREAST LUMPECTOMY;  Surgeon: Erroll Luna, MD;  Location: [place];  Service: General;  Laterality: Right;   REVERSE SHOULDER ARTHROPLASTY Left 11/16/2022   Procedure:  LEFT REVERSE SHOULDER ARTHROPLASTY;  Surgeon: Vanetta Mulders, MD;  Location: [place];  Service: Orthopedics;  Laterality: Left;   Patient Active Problem List   Diagnosis Date Noted   Primary osteoarthritis, left shoulder 11/16/2022   Spondylolisthesis at L5-S1 level 01/04/2022   Lumbar radiculopathy 01/04/2022   Trochanteric bursitis of left hip 11/10/2021   Muscle cramp, nocturnal 11/10/2021   Port-A-Cath in place 12/29/2020   Genetic testing 10/08/2020   Family history of breast cancer 09/22/2020   Primary malignant neoplasm of upper outer quadrant of female breast, right ([place]) 08/27/2020    ONSET DATE: 11/16/22  REFERRING DIAG: s/p left reverse TSA  THERAPY DIAG:  Acute pain of left shoulder  Stiffness of left shoulder, not elsewhere classified  Other symptoms and signs involving the musculoskeletal system  Rationale for Evaluation and Treatment: Rehabilitation  SUBJECTIVE:   SUBJECTIVE STATEMENT: S: "I can't get comfortable anymore, I feel so tight"  PERTINENT HISTORY: Pt is s/p left reverse TSA on 11/16/22. Pt reports she has been wearing the blue sling and has not straightened her arm since the surgery. She has high pain and is fearful of movement due to potential pain. Pt was referred to occupational therapy for evaluation and treatment by Dr. Vanetta Mulders.   PRECAUTIONS: Shoulder; AA/ROM and progress as tolerated  WEIGHT BEARING RESTRICTIONS: Yes NWB  PAIN:  Are you having pain? Yes: NPRS scale: 8/10 Pain location: Biceps and scapula  Pain description: Toothache like Aggravating factors: anything Relieving factors: medication  FALLS: Has patient fallen in last 6 months? No  PATIENT GOALS: To be able to use the LUE as her non-dominant.   OBJECTIVE:   HAND DOMINANCE: Right  ADLs: Overall ADLs: Pt reports everything is difficult-dressing, putting her hair wrap on, cooking, sweeping, putting shoes on, bathing tasks. Pt with difficulty  sleeping, is sleeping on her couch.    FUNCTIONAL OUTCOME MEASURES: Quick Dash: 97.73 12/31/22: 70.45   UPPER EXTREMITY ROM:     Passive ROM Left eval Left 12/21/22 Left 12/31/22  Shoulder flexion 43 121 135  Shoulder abduction 42 136 130  Shoulder internal rotation 59 90 90  Shoulder external rotation -34 17 45  (Blank rows = not tested)      Active ROM Left eval Left 12/21/22 Left 12/31/22  Shoulder flexion  116 104  Shoulder abduction  105 78  Shoulder internal rotation  90 90  Shoulder external rotation  7 25  (Blank rows = not tested)   UPPER EXTREMITY MMT:     MMT Left eval Left 12/31/22  Shoulder flexion  3-/5  Shoulder abduction  3-/5  Shoulder internal rotation  3/5  Shoulder external rotation  3-/5  (Blank rows = not tested)  HAND FUNCTION: Grip strength: Right: 20 lbs; Left: 0 lbs  SENSATION: Pt reports tingling pins/needles in her left hand.   EDEMA: Intermittent   OBSERVATIONS: pt with max fascial restrictions in left upper arm, anterior shoulder, trapezius, and scapular regions. Pt with max guarding and fear of pain with movement.  12/31/22: mod fascial restrictions, one large muscle knot at upper trapezius   TODAY'S TREATMENT:                                                                                                                              DATE:  01/06/23 -Manual therapy: myofascial release and trigger point applied to LUE at the pectoralis, biceps, trapezius, and scapular region in order to decrease fascial restriction throughout upper arm and posterior trap. Attempted massage gun for trigger point massage, pt was unable to tolerate.  -A/ROM: seated, flexion, abduction, protraction, horizontal abduction, er/IR, x10 -Wall Slides: flexion and abduction, x10 each -Lacing overhead - complete chain down and back up -Ball on the wall: ABC's x1, Arms on fire with ball on wall x60" -Scapular strengthening: red theraband, extension, retraction,  rows, x10  01/03/23 -Manual therapy: myofascial release and trigger point applied to LUE at the pectoralis, biceps, trapezius, and scapular region in order to decrease fascial restriction throughout upper arm and posterior trap -P/ROM: flexion, abduction, er/IR, horizontal abduction, 5 reps each -A/ROM: supine, flexion, protraction, horizontal abduction, er/IR, abduction, x12 -Proximal shoulder strengthening: supine-paddles, criss cross, circles each direction, 10x each -Shoulder stretches: flexion, abduction, er, doorway stretch, IR behind back with horizontal towel 2x20" holds.  -Functional reaching: Pt reaching up into flexion to remove items from middle shelf of kitchen cabinet,  then replaced in abduction  12/31/22 -Manual therapy: myofascial release and trigger point applied to LUE at the pectoralis, biceps, trapezius, and scapular region in order to decrease fascial restriction throughout upper arm and posterior trap -P/ROM: flexion, abduction, er/IR, horizontal abduction, 5 reps each -A/ROM: supine, flexion, protraction, horizontal abduction, er/IR, abduction, x10 -Shoulder stretches: flexion, abduction, er, doorway stretch, IR behind back with horizontal towel 2x20" holds.     PATIENT EDUCATION: Education details: Review HEP Person educated: Patient and Parent Education method: Explanation, Demonstration, and Handouts Education comprehension: verbalized understanding and returned demonstration  HOME EXERCISE PROGRAM: Eval: Pendulums, elbow AA/ROM, wrist A/ROM 12/26: Table Slides 1/2: AA/ROM 1/4: Isometrics 1/12: Wall Slides 1/16: A/ROM 1/19: Scapular Strengthening 1/29: shoulder stretches  GOALS: Goals reviewed with patient? Yes  SHORT TERM GOALS: Target date: 12/23/22  Pt will be provided with and educated on HEP to improve ability to use LUE as assist during ADL tasks.   Goal status: IN PROGRESS  2.  Pt will increase P/ROM of LUE to at least 50% ROM to improve  ability to perform dressing tasks using compensatory strategies and minimial physical assistance.   Goal status: IN PROGRESS  3.  Pt will increase LUE strength to 3-/5 to improve ability to use LUE to reach items at waist height during ADLs such as eating or grooming.   Goal status: MET  4.  Pt will decrease LUE fascial restrictions to mod amounts or less to improve ability to perform low level functional reaching tasks and improve tolerance to HEP and ROM tasks.   Goal status: MET    LONG TERM GOALS: Target date: 01/23/23  Pt will decrease pain in LUE to 4/10 or less to improve ability to sleep in the bed for at least 2 hours at night.   Goal status: IN PROGRESS  2.  Pt will increase A/ROM of LUE to at least 75% range to improve ability to reach up and operate her head wrap and reach back to adjust her pants.   Goal status: IN PROGRESS  3.  Pt will decrease LUE fascial restrictions to minimal amounts or less to improve ability to perform functional reaching tasks when getting items out of the cabinets during meal preparation or when getting items from a closet.  Goal status: IN PROGRESS  4.  Pt will increase LUE strength to a 4/5 or greater to improve ability to lift pots and pans and to perform housekeeping tasks.   Goal status: IN PROGRESS    ASSESSMENT:  CLINICAL IMPRESSION: This session pt presents with increased pain and reports of difficulty sleeping due to pain and tightness coming from her shoulder. Pt complaining of pain throughout session with limited ROM around approximately 75% of full ROM in sitting. OT added proximal shoulder exercises this session for endurance and shoulder stability. When completing both lacing and ball on the wall tasks, pt required rest breaks during each task. OT provided verbal and tactile cuing for positioning and technique.    PLAN:  OT FREQUENCY: 2x/week  OT DURATION: 4 weeks  PLANNED INTERVENTIONS: self care/ADL training,  therapeutic exercise, therapeutic activity, manual therapy, passive range of motion, splinting, electrical stimulation, patient/family education, and Re-evaluation  CONSULTED AND AGREED WITH PLAN OF CARE: Patient  PLAN FOR NEXT SESSION: follow up on HEP, standing/sitting AA/ROM and A/ROM, lacing, ball on the wall, arms on fire, add shoulder strengthening with light dumbbells or theraband   Paulita Fujita, OTR/L [phone] 01/03/2023, 11:10 AM

## 2023-01-11 ENCOUNTER — Ambulatory Visit (HOSPITAL_COMMUNITY): Payer: Medicaid Other | Admitting: Occupational Therapy

## 2023-01-11 ENCOUNTER — Encounter (HOSPITAL_COMMUNITY): Payer: Self-pay | Admitting: Occupational Therapy

## 2023-01-11 DIAGNOSIS — M25512 Pain in left shoulder: Secondary | ICD-10-CM

## 2023-01-11 DIAGNOSIS — R29898 Other symptoms and signs involving the musculoskeletal system: Secondary | ICD-10-CM

## 2023-01-11 DIAGNOSIS — M25612 Stiffness of left shoulder, not elsewhere classified: Secondary | ICD-10-CM

## 2023-01-11 NOTE — Therapy (Signed)
OUTPATIENT OCCUPATIONAL THERAPY ORTHO TREATMENT    Patient Name: Monica [name] MRN: [id number] DOB:29-Oct-1971, 52 y.o., female Today's Date: 01/11/2023  PCP: Royce Macadamia, PA-C REFERRING PROVIDER: Vanetta Mulders, MD  END OF SESSION:  OT End of Session - 01/11/23 0904     Visit Number 16    Number of Visits 20    Date for OT Re-Evaluation 01/22/23    Authorization Type [facility] Medicaid    Authorization Time Period 12 visits approved 11/26/22-01/25/23; 6 additional visits approved 1/29-3/15/24    Authorization - Visit Number 3    Authorization - Number of Visits 6    OT Start Time 0902    OT Stop Time 0942    OT Time Calculation (min) 40 min    Activity Tolerance Patient tolerated treatment well    Behavior During Therapy [facility] for tasks assessed/performed             Past Medical History:  Diagnosis Date   Asthma    Back pain    Breast cancer ([place])    Buttock pain    Family history of breast cancer 09/22/2020   Hypertension    Personal history of chemotherapy    Personal history of radiation therapy    Past Surgical History:  Procedure Laterality Date   ABDOMINAL HYSTERECTOMY     APPENDECTOMY     BREAST LUMPECTOMY WITH RADIOACTIVE SEED AND SENTINEL LYMPH NODE BIOPSY Right 09/10/2020   Procedure: RIGHT BREAST LUMPECTOMY WITH RADIOACTIVE SEED AND SENTINEL [place];  Surgeon: Erroll Luna, MD;  Location: [place];  Service: General;  Laterality: Right;   PORTACATH PLACEMENT Right 10/16/2020   Procedure: INSERTION PORT-A-CATH WITH ULTRASOUND GUIDANCE;  Surgeon: Erroll Luna, MD;  Location: [place];  Service: General;  Laterality: Right;   RE-EXCISION OF BREAST LUMPECTOMY Right 10/16/2020   Procedure: RE-EXCISION OF RIGHT BREAST LUMPECTOMY;  Surgeon: Erroll Luna, MD;  Location: [place];  Service: General;  Laterality: Right;   REVERSE SHOULDER ARTHROPLASTY Left 11/16/2022   Procedure: LEFT  REVERSE SHOULDER ARTHROPLASTY;  Surgeon: Vanetta Mulders, MD;  Location: [place];  Service: Orthopedics;  Laterality: Left;   Patient Active Problem List   Diagnosis Date Noted   Primary osteoarthritis, left shoulder 11/16/2022   Spondylolisthesis at L5-S1 level 01/04/2022   Lumbar radiculopathy 01/04/2022   Trochanteric bursitis of left hip 11/10/2021   Muscle cramp, nocturnal 11/10/2021   Port-A-Cath in place 12/29/2020   Genetic testing 10/08/2020   Family history of breast cancer 09/22/2020   Primary malignant neoplasm of upper outer quadrant of female breast, right ([place]) 08/27/2020    ONSET DATE: 11/16/22  REFERRING DIAG: s/p left reverse TSA  THERAPY DIAG:  Acute pain of left shoulder  Stiffness of left shoulder, not elsewhere classified  Other symptoms and signs involving the musculoskeletal system  Rationale for Evaluation and Treatment: Rehabilitation  SUBJECTIVE:   SUBJECTIVE STATEMENT: S: "I  can tie my shoes now, but I still can't do my hair."  PERTINENT HISTORY: Pt is s/p left reverse TSA on 11/16/22. Pt reports she has been wearing the blue sling and has not straightened her arm since the surgery. She has high pain and is fearful of movement due to potential pain. Pt was referred to occupational therapy for evaluation and treatment by Dr. Vanetta Mulders.   PRECAUTIONS: Shoulder; AA/ROM and progress as tolerated  WEIGHT BEARING RESTRICTIONS: Yes NWB  PAIN:  Are you having pain? Yes: NPRS scale: 3/10  Pain location: Biceps and scapula Pain description: Toothache like Aggravating factors: anything Relieving factors: medication  FALLS: Has patient fallen in last 6 months? No  PATIENT GOALS: To be able to use the LUE as her non-dominant.   OBJECTIVE:   HAND DOMINANCE: Right  ADLs: Overall ADLs: Pt reports everything is difficult-dressing, putting her hair wrap on, cooking, sweeping, putting shoes on, bathing tasks. Pt with difficulty  sleeping, is sleeping on her couch.    FUNCTIONAL OUTCOME MEASURES: Quick Dash: 97.73 12/31/22: 70.45   UPPER EXTREMITY ROM:     Passive ROM Left eval Left 12/21/22 Left 12/31/22  Shoulder flexion 43 121 135  Shoulder abduction 42 136 130  Shoulder internal rotation 59 90 90  Shoulder external rotation -34 17 45  (Blank rows = not tested)      Active ROM Left eval Left 12/21/22 Left 12/31/22  Shoulder flexion  116 104  Shoulder abduction  105 78  Shoulder internal rotation  90 90  Shoulder external rotation  7 25  (Blank rows = not tested)   UPPER EXTREMITY MMT:     MMT Left eval Left 12/31/22  Shoulder flexion  3-/5  Shoulder abduction  3-/5  Shoulder internal rotation  3/5  Shoulder external rotation  3-/5  (Blank rows = not tested)  HAND FUNCTION: Grip strength: Right: 20 lbs; Left: 0 lbs  SENSATION: Pt reports tingling pins/needles in her left hand.   EDEMA: Intermittent   OBSERVATIONS: pt with max fascial restrictions in left upper arm, anterior shoulder, trapezius, and scapular regions. Pt with max guarding and fear of pain with movement.  12/31/22: mod fascial restrictions, one large muscle knot at upper trapezius   TODAY'S TREATMENT:                                                                                                                              DATE:  01/11/23 -A/ROM: seated, flexion, abduction, protraction, horizontal abduction, er/IR, x10 -Working on placing her hand on her head and holding it for 15-20" to encourage a sustained stretch for completing brushing and styling her hair.  -Wall Slides: flexion and abduction, x10 each -Stretching: er on the wall, bicep stretch, posterior capsule stretch, 3x15" each -Loop Band exercises: Wall taps, wall V ups, Wall Clock, x10, red theraband -UBE, level 1, 3.5+ RPM, 2.5 mins forward and backward  01/06/23 -Manual therapy: myofascial release and trigger point applied to LUE at the pectoralis,  biceps, trapezius, and scapular region in order to decrease fascial restriction throughout upper arm and posterior trap. Attempted massage gun for trigger point massage, pt was unable to tolerate.  -A/ROM: seated, flexion, abduction, protraction, horizontal abduction, er/IR, x10 -Wall Slides: flexion and abduction, x10 each -Lacing overhead - complete chain down and back up -Ball on the wall: ABC's x1, Arms on fire with ball on wall x60" -Scapular strengthening: red theraband, extension, retraction, rows, x10  01/03/23 -Manual therapy: myofascial release and trigger point  applied to LUE at the pectoralis, biceps, trapezius, and scapular region in order to decrease fascial restriction throughout upper arm and posterior trap -P/ROM: flexion, abduction, er/IR, horizontal abduction, 5 reps each -A/ROM: supine, flexion, protraction, horizontal abduction, er/IR, abduction, x12 -Proximal shoulder strengthening: supine-paddles, criss cross, circles each direction, 10x each -Shoulder stretches: flexion, abduction, er, doorway stretch, IR behind back with horizontal towel 2x20" holds.  -Functional reaching: Pt reaching up into flexion to remove items from middle shelf of kitchen cabinet, then replaced in abduction     PATIENT EDUCATION: Education details: Loop Band Exercises Person educated: Patient and Parent Education method: Explanation, Demonstration, and Handouts Education comprehension: verbalized understanding and returned demonstration  HOME EXERCISE PROGRAM: Eval: Pendulums, elbow AA/ROM, wrist A/ROM 12/26: Table Slides 1/2: AA/ROM 1/4: Isometrics 1/12: Wall Slides 1/16: A/ROM 1/19: Scapular Strengthening 1/29: shoulder stretches 2/6: Loop Band exercises  GOALS: Goals reviewed with patient? Yes  SHORT TERM GOALS: Target date: 12/23/22  Pt will be provided with and educated on HEP to improve ability to use LUE as assist during ADL tasks.   Goal status: IN PROGRESS  2.  Pt will  increase P/ROM of LUE to at least 50% ROM to improve ability to perform dressing tasks using compensatory strategies and minimial physical assistance.   Goal status: IN PROGRESS  3.  Pt will increase LUE strength to 3-/5 to improve ability to use LUE to reach items at waist height during ADLs such as eating or grooming.   Goal status: MET  4.  Pt will decrease LUE fascial restrictions to mod amounts or less to improve ability to perform low level functional reaching tasks and improve tolerance to HEP and ROM tasks.   Goal status: MET    LONG TERM GOALS: Target date: 01/23/23  Pt will decrease pain in LUE to 4/10 or less to improve ability to sleep in the bed for at least 2 hours at night.   Goal status: IN PROGRESS  2.  Pt will increase A/ROM of LUE to at least 75% range to improve ability to reach up and operate her head wrap and reach back to adjust her pants.   Goal status: IN PROGRESS  3.  Pt will decrease LUE fascial restrictions to minimal amounts or less to improve ability to perform functional reaching tasks when getting items out of the cabinets during meal preparation or when getting items from a closet.  Goal status: IN PROGRESS  4.  Pt will increase LUE strength to a 4/5 or greater to improve ability to lift pots and pans and to perform housekeeping tasks.   Goal status: IN PROGRESS    ASSESSMENT:  CLINICAL IMPRESSION: Pt continues to demonstrate weakness and limited ROM this session. She continues to report difficulty with reaching her hair to wash her hair or style it, however when addressing it during the session, pt is able to reach her hair, she just has difficulty sustaining the position. OT added loop band exercises and the UBE for continued strengthening and endurance. Pt requiring increased time during loop band exercises due to muscle fatigue. Reviewed HEP to ensure follow through at home, which pt reports doing all exercises everyday. Verbal and tactile  cuing provided for positioning and technique.    PLAN:  OT FREQUENCY: 2x/week  OT DURATION: 4 weeks  PLANNED INTERVENTIONS: self care/ADL training, therapeutic exercise, therapeutic activity, manual therapy, passive range of motion, splinting, electrical stimulation, patient/family education, and Re-evaluation  CONSULTED AND AGREED WITH PLAN OF CARE: Patient  PLAN FOR NEXT SESSION: follow up on HEP, standing/sitting AA/ROM and A/ROM, lacing, ball on the wall, arms on fire, add shoulder strengthening with light dumbbells or theraband   Paulita Fujita, OTR/L [phone] 01/11/2023, 9:05 AM

## 2023-01-11 NOTE — Patient Instructions (Signed)
Complete _______ repetitions each. Complete _______ time a day.  Wall taps with theraband  With a looped elastic band around forearms/wrists and arms at a 90 angle pressed against the wall. Keeping elbows on the wall, tap right arm out to the right. Hold for 1 second. Return right arm back to neutral. Repeat with left arm.    Wall V slides with Theraband  Place a band loop around hands/forearms and face a wall. Extend both arms diagonally into a V shape on the wall. Hold this stretch for specified amount of time. Lower arms slowly back into neutral position. Repeat.       scap clocks  Tie a loop with a theraband and place around your wrists.  Stand with a wall in front of you.  Picture a clock in front of you.  Place both palms on the wall, arms straight.  While keeping the left/right hand planted, use the right/left hand to pull away and tap each number (1, 3, 5- right OR 11,9,7 left), coming back to center each time.

## 2023-01-14 ENCOUNTER — Encounter (HOSPITAL_COMMUNITY): Payer: Medicaid Other | Admitting: Occupational Therapy

## 2023-01-18 ENCOUNTER — Ambulatory Visit (HOSPITAL_COMMUNITY): Payer: Medicaid Other | Admitting: Occupational Therapy

## 2023-01-18 ENCOUNTER — Encounter (HOSPITAL_COMMUNITY): Payer: Self-pay | Admitting: Occupational Therapy

## 2023-01-18 DIAGNOSIS — M25612 Stiffness of left shoulder, not elsewhere classified: Secondary | ICD-10-CM

## 2023-01-18 DIAGNOSIS — M25512 Pain in left shoulder: Secondary | ICD-10-CM

## 2023-01-18 DIAGNOSIS — R29898 Other symptoms and signs involving the musculoskeletal system: Secondary | ICD-10-CM

## 2023-01-18 NOTE — Patient Instructions (Signed)
Shoulder Abduction with Dumbbells - Thumbs Up   REPS: 10 SETS: 3 DAILY: 3 WEEKLY: 7  [place] with Dumbbells   REPS: 10 SETS: 3 DAILY: 3 WEEKLY: 7  Standing Shoulder Flexion to 90 Degrees with Dumbbells   REPS: 10 SETS: 3 DAILY: 3 WEEKLY: 7  Standing Shoulder Shrugs with Dumbbells   REPS: 10 SETS: 3 DAILY: 3 WEEKLY: 7  Seated Shoulder External Rotation with Dumbbells   REPS: 10 SETS: 3 DAILY: 3 WEEKLY: 7  Standing Shoulder Horizontal Abduction with Dumbbells - Thumbs Up   REPS: 10 SETS: 3 DAILY: 3 WEEKLY: 7

## 2023-01-18 NOTE — Therapy (Signed)
OUTPATIENT OCCUPATIONAL THERAPY ORTHO TREATMENT    Patient Name: Monica [name] MRN: [id number] DOB:07-18-1971, 52 y.o., female Today's Date: 01/11/2023  PCP: Royce Macadamia, PA-C REFERRING PROVIDER: Vanetta Mulders, MD  END OF SESSION:  OT End of Session - 01/11/23 0904     Visit Number 16    Number of Visits 20    Date for OT Re-Evaluation 01/22/23    Authorization Type [facility] Medicaid    Authorization Time Period 12 visits approved 11/26/22-01/25/23; 6 additional visits approved 1/29-3/15/24    Authorization - Visit Number 3    Authorization - Number of Visits 6    OT Start Time 0902    OT Stop Time 0942    OT Time Calculation (min) 40 min    Activity Tolerance Patient tolerated treatment well    Behavior During Therapy [facility] for tasks assessed/performed             Past Medical History:  Diagnosis Date   Asthma    Back pain    Breast cancer ([place])    Buttock pain    Family history of breast cancer 09/22/2020   Hypertension    Personal history of chemotherapy    Personal history of radiation therapy    Past Surgical History:  Procedure Laterality Date   ABDOMINAL HYSTERECTOMY     APPENDECTOMY     BREAST LUMPECTOMY WITH RADIOACTIVE SEED AND SENTINEL LYMPH NODE BIOPSY Right 09/10/2020   Procedure: RIGHT BREAST LUMPECTOMY WITH RADIOACTIVE SEED AND SENTINEL [place];  Surgeon: Erroll Luna, MD;  Location: [place];  Service: General;  Laterality: Right;   PORTACATH PLACEMENT Right 10/16/2020   Procedure: INSERTION PORT-A-CATH WITH ULTRASOUND GUIDANCE;  Surgeon: Erroll Luna, MD;  Location: [place];  Service: General;  Laterality: Right;   RE-EXCISION OF BREAST LUMPECTOMY Right 10/16/2020   Procedure: RE-EXCISION OF RIGHT BREAST LUMPECTOMY;  Surgeon: Erroll Luna, MD;  Location: [place];  Service: General;  Laterality: Right;   REVERSE SHOULDER ARTHROPLASTY Left 11/16/2022   Procedure: LEFT  REVERSE SHOULDER ARTHROPLASTY;  Surgeon: Vanetta Mulders, MD;  Location: [place];  Service: Orthopedics;  Laterality: Left;   Patient Active Problem List   Diagnosis Date Noted   Primary osteoarthritis, left shoulder 11/16/2022   Spondylolisthesis at L5-S1 level 01/04/2022   Lumbar radiculopathy 01/04/2022   Trochanteric bursitis of left hip 11/10/2021   Muscle cramp, nocturnal 11/10/2021   Port-A-Cath in place 12/29/2020   Genetic testing 10/08/2020   Family history of breast cancer 09/22/2020   Primary malignant neoplasm of upper outer quadrant of female breast, right ([place]) 08/27/2020    ONSET DATE: 11/16/22  REFERRING DIAG: s/p left reverse TSA  THERAPY DIAG:  Acute pain of left shoulder  Stiffness of left shoulder, not elsewhere classified  Other symptoms and signs involving the musculoskeletal system  Rationale for Evaluation and Treatment: Rehabilitation  SUBJECTIVE:   SUBJECTIVE STATEMENT: S: "I can put my hair in a ponytail on my own now."  PERTINENT HISTORY: Pt is s/p left reverse TSA on 11/16/22. Pt reports she has been wearing the blue sling and has not straightened her arm since the surgery. She has high pain and is fearful of movement due to potential pain. Pt was referred to occupational therapy for evaluation and treatment by Dr. Vanetta Mulders.   PRECAUTIONS: Shoulder; AA/ROM and progress as tolerated  WEIGHT BEARING RESTRICTIONS: Yes NWB  PAIN:  Are you having pain? Yes: NPRS scale: 3/10 Pain location:  Biceps and scapula Pain description: Toothache like Aggravating factors: anything Relieving factors: medication  FALLS: Has patient fallen in last 6 months? No  PATIENT GOALS: To be able to use the LUE as her non-dominant.   OBJECTIVE:   HAND DOMINANCE: Right  ADLs: Overall ADLs: Pt reports everything is difficult-dressing, putting her hair wrap on, cooking, sweeping, putting shoes on, bathing tasks. Pt with difficulty sleeping,  is sleeping on her couch.    FUNCTIONAL OUTCOME MEASURES: Quick Dash: 97.73 12/31/22: 70.45   UPPER EXTREMITY ROM:     Passive ROM Left eval Left 12/21/22 Left 12/31/22  Shoulder flexion 43 121 135  Shoulder abduction 42 136 130  Shoulder internal rotation 59 90 90  Shoulder external rotation -34 17 45  (Blank rows = not tested)      Active ROM Left eval Left 12/21/22 Left 12/31/22  Shoulder flexion  116 104  Shoulder abduction  105 78  Shoulder internal rotation  90 90  Shoulder external rotation  7 25  (Blank rows = not tested)   UPPER EXTREMITY MMT:     MMT Left eval Left 12/31/22  Shoulder flexion  3-/5  Shoulder abduction  3-/5  Shoulder internal rotation  3/5  Shoulder external rotation  3-/5  (Blank rows = not tested)  HAND FUNCTION: Grip strength: Right: 20 lbs; Left: 0 lbs  SENSATION: Pt reports tingling pins/needles in her left hand.   EDEMA: Intermittent   OBSERVATIONS: pt with max fascial restrictions in left upper arm, anterior shoulder, trapezius, and scapular regions. Pt with max guarding and fear of pain with movement.  12/31/22: mod fascial restrictions, one large muscle knot at upper trapezius   TODAY'S TREATMENT:                                                                                                                              DATE:  01/18/23 -A/ROM: seated, flexion, abduction, protraction, horizontal abduction, er/IR, x10 -Shoulder Strengthening: 2lb dumbbells, flexion, abduction, protraction, horizontal abduction, er/IR, x10 -Stretches: latissimus stretch, ER/IR behind back with towel, er on the wall, 5x15" -Loop Band Exercises: Wall taps, wall V ups, Wall Clock, x10, green theraband -Proximal strengthening: ABC's x1,   01/11/23 -A/ROM: seated, flexion, abduction, protraction, horizontal abduction, er/IR, x10 -Working on placing her hand on her head and holding it for 15-20" to encourage a sustained stretch for completing  brushing and styling her hair.  -Wall Slides: flexion and abduction, x10 each -Stretching: er on the wall, bicep stretch, posterior capsule stretch, 3x15" each -Loop Band exercises: Wall taps, wall V ups, Wall Clock, x10, red theraband -UBE, level 1, 3.5+ RPM, 2.5 mins forward and backward  01/06/23 -Manual therapy: myofascial release and trigger point applied to LUE at the pectoralis, biceps, trapezius, and scapular region in order to decrease fascial restriction throughout upper arm and posterior trap. Attempted massage gun for trigger point massage, pt was unable to tolerate.  -A/ROM: seated, flexion, abduction, protraction,  horizontal abduction, er/IR, x10 -Wall Slides: flexion and abduction, x10 each -Lacing overhead - complete chain down and back up -Ball on the wall: ABC's x1, Arms on fire with ball on wall x60" -Scapular strengthening: red theraband, extension, retraction, rows, x10     PATIENT EDUCATION: Education details: Manufacturing engineer with dumbbells Person educated: Patient and Parent Education method: Explanation, Demonstration, and Handouts Education comprehension: verbalized understanding and returned demonstration  HOME EXERCISE PROGRAM: Eval: Pendulums, elbow AA/ROM, wrist A/ROM 12/26: Table Slides 1/2: AA/ROM 1/4: Isometrics 1/12: Wall Slides 1/16: A/ROM 1/19: Scapular Strengthening 1/29: shoulder stretches 2/6: Loop Band exercises 2/13: Shoulder Strengthening with dumbbells  GOALS: Goals reviewed with patient? Yes  SHORT TERM GOALS: Target date: 12/23/22  Pt will be provided with and educated on HEP to improve ability to use LUE as assist during ADL tasks.   Goal status: IN PROGRESS  2.  Pt will increase P/ROM of LUE to at least 50% ROM to improve ability to perform dressing tasks using compensatory strategies and minimial physical assistance.   Goal status: MET  3.  Pt will increase LUE strength to 3-/5 to improve ability to use LUE to reach  items at waist height during ADLs such as eating or grooming.   Goal status: MET  4.  Pt will decrease LUE fascial restrictions to mod amounts or less to improve ability to perform low level functional reaching tasks and improve tolerance to HEP and ROM tasks.   Goal status: MET    LONG TERM GOALS: Target date: 01/23/23  Pt will decrease pain in LUE to 4/10 or less to improve ability to sleep in the bed for at least 2 hours at night.   Goal status: IN PROGRESS  2.  Pt will increase A/ROM of LUE to at least 75% range to improve ability to reach up and operate her head wrap and reach back to adjust her pants.   Goal status: IN PROGRESS  3.  Pt will decrease LUE fascial restrictions to minimal amounts or less to improve ability to perform functional reaching tasks when getting items out of the cabinets during meal preparation or when getting items from a closet.  Goal status: IN PROGRESS  4.  Pt will increase LUE strength to a 4/5 or greater to improve ability to lift pots and pans and to perform housekeeping tasks.   Goal status: IN PROGRESS    ASSESSMENT:  CLINICAL IMPRESSION: This session Pt continuing to work on overall ROM and strengthening. She continues to have difficulty with A/ROM only achieving approximately 70% of full ROM. When adding weights, OT had her only lift to 90 degrees flexion and abduction, which was increasingly difficult for pt. Pt also working on stretching this session to assist with releasing/relaxing tight  muscles along her L side. Verbal and tactile cuing provided for positioning and technique.    PLAN:  OT FREQUENCY: 2x/week  OT DURATION: 4 weeks  PLANNED INTERVENTIONS: self care/ADL training, therapeutic exercise, therapeutic activity, manual therapy, passive range of motion, splinting, electrical stimulation, patient/family education, and Re-evaluation  CONSULTED AND AGREED WITH PLAN OF CARE: Patient  PLAN FOR NEXT SESSION: follow up on HEP,  standing/sitting AA/ROM and A/ROM, lacing, ball on the wall, arms on fire, add shoulder strengthening with light dumbbells or theraband, [place], OTR/L [phone] 01/11/2023, 9:05 AM

## 2023-01-20 ENCOUNTER — Telehealth: Payer: Self-pay | Admitting: Orthopaedic Surgery

## 2023-01-20 NOTE — Telephone Encounter (Signed)
Pt called requesting pain medication be sent to [facility] in [place] . Pt phone number is [phone].

## 2023-01-21 ENCOUNTER — Encounter (HOSPITAL_COMMUNITY): Payer: Self-pay | Admitting: Occupational Therapy

## 2023-01-21 ENCOUNTER — Other Ambulatory Visit (HOSPITAL_BASED_OUTPATIENT_CLINIC_OR_DEPARTMENT_OTHER): Payer: Self-pay | Admitting: Orthopaedic Surgery

## 2023-01-21 ENCOUNTER — Ambulatory Visit (HOSPITAL_COMMUNITY): Payer: Medicaid Other | Admitting: Occupational Therapy

## 2023-01-21 DIAGNOSIS — M25612 Stiffness of left shoulder, not elsewhere classified: Secondary | ICD-10-CM

## 2023-01-21 DIAGNOSIS — R29898 Other symptoms and signs involving the musculoskeletal system: Secondary | ICD-10-CM

## 2023-01-21 DIAGNOSIS — M25512 Pain in left shoulder: Secondary | ICD-10-CM | POA: Diagnosis not present

## 2023-01-21 DIAGNOSIS — M19012 Primary osteoarthritis, left shoulder: Secondary | ICD-10-CM

## 2023-01-21 NOTE — Telephone Encounter (Signed)
Informed patient of Dr. Ezekiel Ina message and place pain clinic referral

## 2023-01-21 NOTE — Therapy (Signed)
OUTPATIENT OCCUPATIONAL THERAPY ORTHO TREATMENT    Patient Name: Monica [name] MRN: [id number] DOB:1971-07-03, 52 y.o., female Today's Date: 01/21/2023  PCP: Royce Macadamia, PA-C REFERRING PROVIDER: Vanetta Mulders, MD  END OF SESSION:  OT End of Session - 01/21/23 0940     Visit Number 18    Number of Visits 20    Date for OT Re-Evaluation 01/22/23    Authorization Type [employer]    Authorization Time Period 12 visits approved 11/26/22-01/25/23; 6 additional visits approved 1/29-3/15/24    Authorization - Visit Number 5    Authorization - Number of Visits 6    OT Start Time 0945    OT Stop Time [id number]    OT Time Calculation (min) 40 min    Activity Tolerance Patient tolerated treatment well    Behavior During Therapy [facility] for tasks assessed/performed             Past Medical History:  Diagnosis Date   Asthma    Back pain    Breast cancer ([place])    Buttock pain    Family history of breast cancer 09/22/2020   Hypertension    Personal history of chemotherapy    Personal history of radiation therapy    Past Surgical History:  Procedure Laterality Date   ABDOMINAL HYSTERECTOMY     APPENDECTOMY     BREAST LUMPECTOMY WITH RADIOACTIVE SEED AND SENTINEL LYMPH NODE BIOPSY Right 09/10/2020   Procedure: RIGHT BREAST LUMPECTOMY WITH RADIOACTIVE SEED AND SENTINEL [place];  Surgeon: Erroll Luna, MD;  Location: [place];  Service: General;  Laterality: Right;   PORTACATH PLACEMENT Right 10/16/2020   Procedure: INSERTION PORT-A-CATH WITH ULTRASOUND GUIDANCE;  Surgeon: Erroll Luna, MD;  Location: [place];  Service: General;  Laterality: Right;   RE-EXCISION OF BREAST LUMPECTOMY Right 10/16/2020   Procedure: RE-EXCISION OF RIGHT BREAST LUMPECTOMY;  Surgeon: Erroll Luna, MD;  Location: [place];  Service: General;  Laterality: Right;   REVERSE SHOULDER ARTHROPLASTY Left 11/16/2022   Procedure:  LEFT REVERSE SHOULDER ARTHROPLASTY;  Surgeon: Vanetta Mulders, MD;  Location: [place];  Service: Orthopedics;  Laterality: Left;   Patient Active Problem List   Diagnosis Date Noted   Primary osteoarthritis, left shoulder 11/16/2022   Spondylolisthesis at L5-S1 level 01/04/2022   Lumbar radiculopathy 01/04/2022   Trochanteric bursitis of left hip 11/10/2021   Muscle cramp, nocturnal 11/10/2021   Port-A-Cath in place 12/29/2020   Genetic testing 10/08/2020   Family history of breast cancer 09/22/2020   Primary malignant neoplasm of upper outer quadrant of female breast, right ([place]) 08/27/2020    ONSET DATE: 11/16/22  REFERRING DIAG: s/p left reverse TSA  THERAPY DIAG:  Acute pain of left shoulder  Stiffness of left shoulder, not elsewhere classified  Other symptoms and signs involving the musculoskeletal system  Rationale for Evaluation and Treatment: Rehabilitation  SUBJECTIVE:   SUBJECTIVE STATEMENT: S: "I just feel so weak and painful still."  PERTINENT HISTORY: Pt is s/p left reverse TSA on 11/16/22. Pt reports she has been wearing the blue sling and has not straightened her arm since the surgery. She has high pain and is fearful of movement due to potential pain. Pt was referred to occupational therapy for evaluation and treatment by Dr. Vanetta Mulders.   PRECAUTIONS: Shoulder; AA/ROM and progress as tolerated  WEIGHT BEARING RESTRICTIONS: Yes NWB  PAIN:  Are you having pain? Yes: NPRS scale: 6/10 Pain location: Biceps and scapula Pain  description: Toothache like Aggravating factors: anything Relieving factors: medication  FALLS: Has patient fallen in last 6 months? No  PATIENT GOALS: To be able to use the LUE as her non-dominant.   OBJECTIVE:   HAND DOMINANCE: Right  ADLs: Overall ADLs: Pt reports everything is difficult-dressing, putting her hair wrap on, cooking, sweeping, putting shoes on, bathing tasks. Pt with difficulty sleeping, is  sleeping on her couch.    FUNCTIONAL OUTCOME MEASURES: Quick Dash: 97.73 12/31/22: 70.45 01/21/23: 61.36   UPPER EXTREMITY ROM:     Passive ROM Left eval Left 12/21/22 Left 12/31/22  Shoulder flexion 43 121 135  Shoulder abduction 42 136 130  Shoulder internal rotation 59 90 90  Shoulder external rotation -34 17 45  (Blank rows = not tested)      Active ROM Left eval Left 12/21/22 Left 12/31/22 Left 01/21/23 seated  Shoulder flexion  116 104 115  Shoulder abduction  105 78 106  Shoulder internal rotation  90 90 90  Shoulder external rotation  7 25 17  $ (Blank rows = not tested)   UPPER EXTREMITY MMT:     MMT Left eval Left 12/31/22 Left 01/21/23  Shoulder flexion  3-/5 3/5  Shoulder abduction  3-/5 3/5  Shoulder internal rotation  3/5 3/5  Shoulder external rotation  3-/5 3/5  (Blank rows = not tested)  HAND FUNCTION: Grip strength: Right: 20 lbs; Left: 0 lbs Grip Strength: Right: 30 lbs; Left: 14 lbs  SENSATION: Pt reports tingling pins/needles in her left hand.   EDEMA: Intermittent   OBSERVATIONS: pt with max fascial restrictions in left upper arm, anterior shoulder, trapezius, and scapular regions. Pt with max guarding and fear of pain with movement.  12/31/22: mod fascial restrictions, one large muscle knot at upper trapezius   TODAY'S TREATMENT:                                                                                                                              DATE:  01/21/23 -A/ROM: seated, flexion, abduction, protraction, horizontal abduction, er/IR, x10 -Measurements for Reassessment -Quadruped: weight shifting 2x60" -Stretches: latissimus stretch, ER/IR behind back with towel, er on the wall, 5x15" -UBE: level 1, 68mn, unable to keep above 1.5 RPM with OT providing mod assist -Pulley's: flexion and abduction, x60"  01/18/23 -A/ROM: seated, flexion, abduction, protraction, horizontal abduction, er/IR, x10 -Shoulder Strengthening: 2lb  dumbbells, flexion, abduction, protraction, horizontal abduction, er/IR, x10 -Stretches: latissimus stretch, ER/IR behind back with towel, er on the wall, 5x15" -Loop Band Exercises: Wall taps, wall V ups, Wall Clock, x10, green theraband -Proximal strengthening: ABC's x1,   01/11/23 -A/ROM: seated, flexion, abduction, protraction, horizontal abduction, er/IR, x10 -Working on placing her hand on her head and holding it for 15-20" to encourage a sustained stretch for completing brushing and styling her hair.  -Wall Slides: flexion and abduction, x10 each -Stretching: er on the wall, bicep stretch, posterior capsule stretch, 3x15" each -Loop Band exercises:  Wall taps, wall V ups, Wall Clock, x10, red theraband -UBE, level 1, 3.5+ RPM, 2.5 mins forward and backward    PATIENT EDUCATION: Education details: Review HEP Person educated: Patient and Parent Education method: Explanation, Demonstration, and Handouts Education comprehension: verbalized understanding and returned demonstration  HOME EXERCISE PROGRAM: Eval: Pendulums, elbow AA/ROM, wrist A/ROM 12/26: Table Slides 1/2: AA/ROM 1/4: Isometrics 1/12: Wall Slides 1/16: A/ROM 1/19: Scapular Strengthening 1/29: shoulder stretches 2/6: Loop Band exercises 2/13: Shoulder Strengthening with dumbbells  GOALS: Goals reviewed with patient? Yes  SHORT TERM GOALS: Target date: 12/23/22  Pt will be provided with and educated on HEP to improve ability to use LUE as assist during ADL tasks.   Goal status: IN PROGRESS  2.  Pt will increase P/ROM of LUE to at least 50% ROM to improve ability to perform dressing tasks using compensatory strategies and minimial physical assistance.   Goal status: MET  3.  Pt will increase LUE strength to 3-/5 to improve ability to use LUE to reach items at waist height during ADLs such as eating or grooming.   Goal status: MET  4.  Pt will decrease LUE fascial restrictions to mod amounts or less to  improve ability to perform low level functional reaching tasks and improve tolerance to HEP and ROM tasks.   Goal status: MET    LONG TERM GOALS: Target date: 01/23/23  Pt will decrease pain in LUE to 4/10 or less to improve ability to sleep in the bed for at least 2 hours at night.   Goal status: IN PROGRESS  2.  Pt will increase A/ROM of LUE to at least 75% range to improve ability to reach up and operate her head wrap and reach back to adjust her pants.   Goal status: IN PROGRESS  3.  Pt will decrease LUE fascial restrictions to minimal amounts or less to improve ability to perform functional reaching tasks when getting items out of the cabinets during meal preparation or when getting items from a closet.  Goal status: IN PROGRESS  4.  Pt will increase LUE strength to a 4/5 or greater to improve ability to lift pots and pans and to perform housekeeping tasks.   Goal status: IN PROGRESS    ASSESSMENT:  CLINICAL IMPRESSION: Pt continuing to present with increased weakness, and poor ROM. She completed her reassessment this session demonstrating improvement back to where she was on 12/21/22. As far as strength, she is unable to withstand any resistance, despite being able to lift her arms in flexion and abduction. OT and pt agreed to continue OT for 1 time a week for 6 weeks, while focusing on strengthening. OT providing continued education on importance of completing her HEP daily. Verbal and tactile cuing provided for positioning and technique.    PLAN:  OT FREQUENCY: 2x/week  OT DURATION: 4 weeks  PLANNED INTERVENTIONS: self care/ADL training, therapeutic exercise, therapeutic activity, manual therapy, passive range of motion, splinting, electrical stimulation, patient/family education, and Re-evaluation  CONSULTED AND AGREED WITH PLAN OF CARE: Patient  PLAN FOR NEXT SESSION: follow up on HEP, standing/sitting AA/ROM and A/ROM, lacing, ball on the wall, arms on fire, add  shoulder strengthening with light dumbbells or theraband, therapy ball exercises   Paulita Fujita, OTR/L [phone] 01/21/2023, 9:44 AM

## 2023-01-24 ENCOUNTER — Encounter (HOSPITAL_COMMUNITY): Payer: Medicaid Other | Admitting: Occupational Therapy

## 2023-02-03 ENCOUNTER — Encounter (HOSPITAL_COMMUNITY): Payer: Self-pay | Admitting: Occupational Therapy

## 2023-02-03 ENCOUNTER — Ambulatory Visit (HOSPITAL_COMMUNITY): Payer: Medicaid Other | Admitting: Occupational Therapy

## 2023-02-03 DIAGNOSIS — M25612 Stiffness of left shoulder, not elsewhere classified: Secondary | ICD-10-CM

## 2023-02-03 DIAGNOSIS — M25512 Pain in left shoulder: Secondary | ICD-10-CM

## 2023-02-03 DIAGNOSIS — R29898 Other symptoms and signs involving the musculoskeletal system: Secondary | ICD-10-CM

## 2023-02-03 NOTE — Patient Instructions (Signed)

## 2023-02-03 NOTE — Therapy (Signed)
OUTPATIENT OCCUPATIONAL THERAPY ORTHO TREATMENT    Patient Name: Monica [name] MRN: [id number] DOB:Jun 26, 1971, 52 y.o., female Today's Date: 02/04/2023  PCP: Royce Macadamia, PA-C REFERRING PROVIDER: Vanetta Mulders, MD  END OF SESSION:  OT End of Session - 02/03/23 0949     Visit Number 19    Number of Visits 25    Date for OT Re-Evaluation 02/18/23    Authorization Type [employer]    Authorization Time Period 12 visits approved 11/26/22-01/25/23; 6 additional visits approved 1/29-3/15/24    Authorization - Visit Number 6    Authorization - Number of Visits 6    OT Start Time 0946    OT Stop Time 1026    OT Time Calculation (min) 40 min    Activity Tolerance Patient tolerated treatment well    Behavior During Therapy [employer] for tasks assessed/performed             Past Medical History:  Diagnosis Date   Asthma    Back pain    Breast cancer ([place])    Buttock pain    Family history of breast cancer 09/22/2020   Hypertension    Personal history of chemotherapy    Personal history of radiation therapy    Past Surgical History:  Procedure Laterality Date   ABDOMINAL HYSTERECTOMY     APPENDECTOMY     BREAST LUMPECTOMY WITH RADIOACTIVE SEED AND SENTINEL LYMPH NODE BIOPSY Right 09/10/2020   Procedure: RIGHT BREAST LUMPECTOMY WITH RADIOACTIVE SEED AND SENTINEL [place];  Surgeon: Erroll Luna, MD;  Location: [place];  Service: General;  Laterality: Right;   PORTACATH PLACEMENT Right 10/16/2020   Procedure: INSERTION PORT-A-CATH WITH ULTRASOUND GUIDANCE;  Surgeon: Erroll Luna, MD;  Location: [place];  Service: General;  Laterality: Right;   RE-EXCISION OF BREAST LUMPECTOMY Right 10/16/2020   Procedure: RE-EXCISION OF RIGHT BREAST LUMPECTOMY;  Surgeon: Erroll Luna, MD;  Location: [place];  Service: General;  Laterality: Right;   REVERSE SHOULDER ARTHROPLASTY Left 11/16/2022   Procedure: LEFT  REVERSE SHOULDER ARTHROPLASTY;  Surgeon: Vanetta Mulders, MD;  Location: [place];  Service: Orthopedics;  Laterality: Left;   Patient Active Problem List   Diagnosis Date Noted   Primary osteoarthritis, left shoulder 11/16/2022   Spondylolisthesis at L5-S1 level 01/04/2022   Lumbar radiculopathy 01/04/2022   Trochanteric bursitis of left hip 11/10/2021   Muscle cramp, nocturnal 11/10/2021   Port-A-Cath in place 12/29/2020   Genetic testing 10/08/2020   Family history of breast cancer 09/22/2020   Primary malignant neoplasm of upper outer quadrant of female breast, right ([place]) 08/27/2020    ONSET DATE: 11/16/22  REFERRING DIAG: s/p left reverse TSA  THERAPY DIAG:  Acute pain of left shoulder  Stiffness of left shoulder, not elsewhere classified  Other symptoms and signs involving the musculoskeletal system  Rationale for Evaluation and Treatment: Rehabilitation  SUBJECTIVE:   SUBJECTIVE STATEMENT: S: "When it rains and is cold it's gonna ache to a 10"  PERTINENT HISTORY: Pt is s/p left reverse TSA on 11/16/22. Pt reports she has been wearing the blue sling and has not straightened her arm since the surgery. She has high pain and is fearful of movement due to potential pain. Pt was referred to occupational therapy for evaluation and treatment by Dr. Vanetta Mulders.   PRECAUTIONS: Shoulder; AA/ROM and progress as tolerated  WEIGHT BEARING RESTRICTIONS: Yes NWB  PAIN:  Are you having pain? Yes: NPRS scale: 7/10 Pain location:  Biceps and scapula Pain description: Toothache like Aggravating factors: anything Relieving factors: medication  FALLS: Has patient fallen in last 6 months? No  PATIENT GOALS: To be able to use the LUE as her non-dominant.   OBJECTIVE:   HAND DOMINANCE: Right  ADLs: Overall ADLs: Pt reports everything is difficult-dressing, putting her hair wrap on, cooking, sweeping, putting shoes on, bathing tasks. Pt with difficulty  sleeping, is sleeping on her couch.    FUNCTIONAL OUTCOME MEASURES: Quick Dash: 97.73 12/31/22: 70.45 01/21/23: 61.36   UPPER EXTREMITY ROM:     Passive ROM Left eval Left 12/21/22 Left 12/31/22  Shoulder flexion 43 121 135  Shoulder abduction 42 136 130  Shoulder internal rotation 59 90 90  Shoulder external rotation -34 17 45  (Blank rows = not tested)      Active ROM Left eval Left 12/21/22 Left 12/31/22 Left 01/21/23 seated  Shoulder flexion  116 104 115  Shoulder abduction  105 78 106  Shoulder internal rotation  90 90 90  Shoulder external rotation  '7 25 17  '$ (Blank rows = not tested)   UPPER EXTREMITY MMT:     MMT Left eval Left 12/31/22 Left 01/21/23  Shoulder flexion  3-/5 3/5  Shoulder abduction  3-/5 3/5  Shoulder internal rotation  3/5 3/5  Shoulder external rotation  3-/5 3/5  (Blank rows = not tested)  HAND FUNCTION: Grip strength: Right: 20 lbs; Left: 0 lbs Grip Strength: Right: 30 lbs; Left: 14 lbs  SENSATION: Pt reports tingling pins/needles in her left hand.   EDEMA: Intermittent   OBSERVATIONS: pt with max fascial restrictions in left upper arm, anterior shoulder, trapezius, and scapular regions. Pt with max guarding and fear of pain with movement.  12/31/22: mod fascial restrictions, one large muscle knot at upper trapezius   TODAY'S TREATMENT:                                                                                                                              DATE:  02/03/23 -A/ROM: seated, flexion, abduction, protraction, horizontal abduction, er/IR, x10 -Scapular Strengthening: red theraband, extension, retraction, rows, x10 -Shoulder Strengthening: red theraband, flexion, abduction, horizontal abduction, er/IR, x10 -Therapy Ball Strengthening: chest press, flexion, circles both directions, x10   01/21/23 -A/ROM: seated, flexion, abduction, protraction, horizontal abduction, er/IR, x10 -Measurements for Reassessment -Quadruped:  weight shifting 2x60" -Stretches: latissimus stretch, ER/IR behind back with towel, er on the wall, 5x15" -UBE: level 1, 17mn, unable to keep above 1.5 RPM with OT providing mod assist -Pulley's: flexion and abduction, x60"  01/18/23 -A/ROM: seated, flexion, abduction, protraction, horizontal abduction, er/IR, x10 -Shoulder Strengthening: 2lb dumbbells, flexion, abduction, protraction, horizontal abduction, er/IR, x10 -Stretches: latissimus stretch, ER/IR behind back with towel, er on the wall, 5x15" -Loop Band Exercises: Wall taps, wall V ups, Wall Clock, x10, green theraband -Proximal strengthening: ABC's x1,    PATIENT EDUCATION: Education details: Shoulder Strengthening with therabands Person educated: Patient and Parent  Education method: Explanation, Demonstration, and Handouts Education comprehension: verbalized understanding and returned demonstration  HOME EXERCISE PROGRAM: Eval: Pendulums, elbow AA/ROM, wrist A/ROM 12/26: Table Slides 1/2: AA/ROM 1/4: Isometrics 1/12: Wall Slides 1/16: A/ROM 1/19: Scapular Strengthening 1/29: shoulder stretches 2/6: Loop Band exercises 2/13: Shoulder Strengthening with dumbbells 2/29: Shoulder strengthening with therabands  GOALS: Goals reviewed with patient? Yes  SHORT TERM GOALS: Target date: 12/23/22  Pt will be provided with and educated on HEP to improve ability to use LUE as assist during ADL tasks.   Goal status: IN PROGRESS  2.  Pt will increase P/ROM of LUE to at least 50% ROM to improve ability to perform dressing tasks using compensatory strategies and minimial physical assistance.   Goal status: MET  3.  Pt will increase LUE strength to 3-/5 to improve ability to use LUE to reach items at waist height during ADLs such as eating or grooming.   Goal status: MET  4.  Pt will decrease LUE fascial restrictions to mod amounts or less to improve ability to perform low level functional reaching tasks and improve tolerance  to HEP and ROM tasks.   Goal status: MET    LONG TERM GOALS: Target date: 01/23/23  Pt will decrease pain in LUE to 4/10 or less to improve ability to sleep in the bed for at least 2 hours at night.   Goal status: IN PROGRESS  2.  Pt will increase A/ROM of LUE to at least 75% range to improve ability to reach up and operate her head wrap and reach back to adjust her pants.   Goal status: IN PROGRESS  3.  Pt will decrease LUE fascial restrictions to minimal amounts or less to improve ability to perform functional reaching tasks when getting items out of the cabinets during meal preparation or when getting items from a closet.  Goal status: IN PROGRESS  4.  Pt will increase LUE strength to a 4/5 or greater to improve ability to lift pots and pans and to perform housekeeping tasks.   Goal status: IN PROGRESS    ASSESSMENT:  CLINICAL IMPRESSION: This session continued to focus on strengthening and ROM. She continues to demonstrate difficulty achieving greater than 75% of full ROM when seated due to muscle weakness and pain. With all strength based exercises, pt required frequent rest breaks and demonstrated increased difficulty requiring her to back off on the resistance of the bands. OT providing verbal and tactile cuing for positioning and technique throughout session.    PLAN:  OT FREQUENCY: 2x/week  OT DURATION: 4 weeks  PLANNED INTERVENTIONS: self care/ADL training, therapeutic exercise, therapeutic activity, manual therapy, passive range of motion, splinting, electrical stimulation, patient/family education, and Re-evaluation  CONSULTED AND AGREED WITH PLAN OF CARE: Patient  PLAN FOR NEXT SESSION: follow up on HEP, standing/sitting AA/ROM and A/ROM, lacing, ball on the wall, arms on fire, add shoulder strengthening with light dumbbells or theraband, therapy ball exercises   Paulita Fujita, OTR/L [phone] 02/04/2023, 10:34 AM

## 2023-02-07 ENCOUNTER — Ambulatory Visit (HOSPITAL_COMMUNITY): Payer: Medicare Other | Attending: *Deleted | Admitting: Occupational Therapy

## 2023-02-07 ENCOUNTER — Encounter (HOSPITAL_COMMUNITY): Payer: Self-pay | Admitting: Occupational Therapy

## 2023-02-07 DIAGNOSIS — M25512 Pain in left shoulder: Secondary | ICD-10-CM | POA: Insufficient documentation

## 2023-02-07 DIAGNOSIS — M25612 Stiffness of left shoulder, not elsewhere classified: Secondary | ICD-10-CM | POA: Diagnosis present

## 2023-02-07 DIAGNOSIS — R29898 Other symptoms and signs involving the musculoskeletal system: Secondary | ICD-10-CM | POA: Diagnosis present

## 2023-02-07 NOTE — Patient Instructions (Signed)

## 2023-02-07 NOTE — Therapy (Signed)
OUTPATIENT OCCUPATIONAL THERAPY ORTHO TREATMENT AND PROGRESS NOTE   Patient Name: Glendola Renderos MRN: [id number] DOB:08-Apr-1971, 52 y.o., female Today's Date: 02/07/2023  PCP: Royce Macadamia, PA-C REFERRING PROVIDER: Vanetta Mulders, MD  Progress Note Reporting Period 01/21/23 to 02/07/23  See note below for Objective Data and Assessment of Progress/Goals.    END OF SESSION:  OT End of Session - 02/07/23 0906     Visit Number 20    Number of Visits 25    Date for OT Re-Evaluation 02/18/23    Authorization Type [employer]    Authorization Time Period 12 visits approved 11/26/22-01/25/23; 6 additional visits approved 1/29-3/15/24    OT Start Time 0905    OT Stop Time 0945    OT Time Calculation (min) 40 min    Activity Tolerance Patient tolerated treatment well    Behavior During Therapy [facility] for tasks assessed/performed             Past Medical History:  Diagnosis Date   Asthma    Back pain    Breast cancer ([place])    Buttock pain    Family history of breast cancer 09/22/2020   Hypertension    Personal history of chemotherapy    Personal history of radiation therapy    Past Surgical History:  Procedure Laterality Date   ABDOMINAL HYSTERECTOMY     APPENDECTOMY     BREAST LUMPECTOMY WITH RADIOACTIVE SEED AND SENTINEL LYMPH NODE BIOPSY Right 09/10/2020   Procedure: RIGHT BREAST LUMPECTOMY WITH RADIOACTIVE SEED AND SENTINEL LYMPH NODE MAPPING;  Surgeon: Erroll Luna, MD;  Location: [place];  Service: General;  Laterality: Right;   PORTACATH PLACEMENT Right 10/16/2020   Procedure: INSERTION PORT-A-CATH WITH ULTRASOUND GUIDANCE;  Surgeon: Erroll Luna, MD;  Location: [place];  Service: General;  Laterality: Right;   RE-EXCISION OF BREAST LUMPECTOMY Right 10/16/2020   Procedure: RE-EXCISION OF RIGHT BREAST LUMPECTOMY;  Surgeon: Erroll Luna, MD;  Location: [place];  Service: General;  Laterality:  Right;   REVERSE SHOULDER ARTHROPLASTY Left 11/16/2022   Procedure: LEFT REVERSE SHOULDER ARTHROPLASTY;  Surgeon: Vanetta Mulders, MD;  Location: [place];  Service: Orthopedics;  Laterality: Left;   Patient Active Problem List   Diagnosis Date Noted   Primary osteoarthritis, left shoulder 11/16/2022   Spondylolisthesis at L5-S1 level 01/04/2022   Lumbar radiculopathy 01/04/2022   Trochanteric bursitis of left hip 11/10/2021   Muscle cramp, nocturnal 11/10/2021   Port-A-Cath in place 12/29/2020   Genetic testing 10/08/2020   Family history of breast cancer 09/22/2020   Primary malignant neoplasm of upper outer quadrant of female breast, right ([place]) 08/27/2020    ONSET DATE: 11/16/22  REFERRING DIAG: s/p left reverse TSA  THERAPY DIAG:  Acute pain of left shoulder  Stiffness of left shoulder, not elsewhere classified  Other symptoms and signs involving the musculoskeletal system  Rationale for Evaluation and Treatment: Rehabilitation  SUBJECTIVE:   SUBJECTIVE STATEMENT: S: My arm is just aching"  PERTINENT HISTORY: Pt is s/p left reverse TSA on 11/16/22. Pt reports she has been wearing the blue sling and has not straightened her arm since the surgery. She has high pain and is fearful of movement due to potential pain. Pt was referred to occupational therapy for evaluation and treatment by Dr. Vanetta Mulders.   PRECAUTIONS: Shoulder; AA/ROM and progress as tolerated  WEIGHT BEARING RESTRICTIONS: Yes NWB  PAIN:  Are you having pain? Yes: NPRS scale: 8/10 Pain location: Bicep  area Pain description: Aching Aggravating factors: anything Relieving factors: medication  FALLS: Has patient fallen in last 6 months? No  PATIENT GOALS: To be able to use the LUE as her non-dominant.   OBJECTIVE:   HAND DOMINANCE: Right  ADLs: Overall ADLs: Pt reports everything is difficult-dressing, putting her hair wrap on, cooking, sweeping, putting shoes on, bathing  tasks. Pt with difficulty sleeping, is sleeping on her couch.    FUNCTIONAL OUTCOME MEASURES: Quick Dash: 97.73 12/31/22: 70.45 01/21/23: 61.36   UPPER EXTREMITY ROM:     Passive ROM Left eval Left 12/21/22 Left 12/31/22  Shoulder flexion 43 121 135  Shoulder abduction 42 136 130  Shoulder internal rotation 59 90 90  Shoulder external rotation -34 17 45  (Blank rows = not tested)      Active ROM Left eval Left 12/21/22 Left 12/31/22 Left 01/21/23 seated Left 02/07/23 seated  Shoulder flexion  116 104 115 155  Shoulder abduction  105 78 106 128  Shoulder internal rotation  90 90 90   Shoulder external rotation  '7 25 17   '$ (Blank rows = not tested)   UPPER EXTREMITY MMT:     MMT Left eval Left 12/31/22 Left 01/21/23 Left 02/07/23  Shoulder flexion  3-/5 3/5 4-/5  Shoulder abduction  3-/5 3/5 4-/5  Shoulder internal rotation  3/5 3/5 4-/5  Shoulder external rotation  3-/5 3/5 4-/5  (Blank rows = not tested)  HAND FUNCTION: Grip strength: Right: 20 lbs; Left: 0 lbs Grip Strength: Right: 30 lbs; Left: 14 lbs  SENSATION: Pt reports tingling pins/needles in her left hand.   EDEMA: Intermittent   OBSERVATIONS: pt with max fascial restrictions in left upper arm, anterior shoulder, trapezius, and scapular regions. Pt with max guarding and fear of pain with movement.  12/31/22: mod fascial restrictions, one large muscle knot at upper trapezius   TODAY'S TREATMENT:                                                                                                                              DATE:  02/07/23 -AA/ROM: seated, flexion, abduction, protraction, horizontal abduction, er/IR, x10 -A/ROM: seated, flexion, abduction, protraction, horizontal abduction, er/IR, x10 -Arms on fire 2x60" -Therapy Ball Exercises: chest press, flexion, V ups, overhead press, circles both directions, x10  02/03/23 -A/ROM: seated, flexion, abduction, protraction, horizontal abduction, er/IR,  x10 -Scapular Strengthening: red theraband, extension, retraction, rows, x10 -Shoulder Strengthening: red theraband, flexion, abduction, horizontal abduction, er/IR, x10 -Therapy Ball Strengthening: chest press, flexion, circles both directions, x10   01/21/23 -A/ROM: seated, flexion, abduction, protraction, horizontal abduction, er/IR, x10 -Measurements for Reassessment -Quadruped: weight shifting 2x60" -Stretches: latissimus stretch, ER/IR behind back with towel, er on the wall, 5x15" -UBE: level 1, 70mn, unable to keep above 1.5 RPM with OT providing mod assist -Pulley's: flexion and abduction, x60"   PATIENT EDUCATION: Education details: Therapy Ball Exercises Person educated: Patient and Parent Education method: Explanation, Demonstration, and Handouts  Education comprehension: verbalized understanding and returned demonstration  HOME EXERCISE PROGRAM: Eval: Pendulums, elbow AA/ROM, wrist A/ROM 12/26: Table Slides 1/2: AA/ROM 1/4: Isometrics 1/12: Wall Slides 1/16: A/ROM 1/19: Scapular Strengthening 1/29: shoulder stretches 2/6: Loop Band exercises 2/13: Shoulder Strengthening with dumbbells 2/29: Shoulder strengthening with theraband's 3/4: therapy ball exercises  GOALS: Goals reviewed with patient? Yes  SHORT TERM GOALS: Target date: 12/23/22  Pt will be provided with and educated on HEP to improve ability to use LUE as assist during ADL tasks.   Goal status: IN PROGRESS  2.  Pt will increase P/ROM of LUE to at least 50% ROM to improve ability to perform dressing tasks using compensatory strategies and minimial physical assistance.   Goal status: MET  3.  Pt will increase LUE strength to 3-/5 to improve ability to use LUE to reach items at waist height during ADLs such as eating or grooming.   Goal status: MET  4.  Pt will decrease LUE fascial restrictions to mod amounts or less to improve ability to perform low level functional reaching tasks and improve  tolerance to HEP and ROM tasks.   Goal status: MET    LONG TERM GOALS: Target date: 01/23/23  Pt will decrease pain in LUE to 4/10 or less to improve ability to sleep in the bed for at least 2 hours at night.   Goal status: IN PROGRESS  2.  Pt will increase A/ROM of LUE to at least 75% range to improve ability to reach up and operate her head wrap and reach back to adjust her pants.   Goal status: IN PROGRESS  3.  Pt will decrease LUE fascial restrictions to minimal amounts or less to improve ability to perform functional reaching tasks when getting items out of the cabinets during meal preparation or when getting items from a closet.  Goal status: IN PROGRESS  4.  Pt will increase LUE strength to a 4/5 or greater to improve ability to lift pots and pans and to perform housekeeping tasks.   Goal status: IN PROGRESS    ASSESSMENT:  CLINICAL IMPRESSION: Pt presenting to session with continued 8/10 pain. This session pt was reassessed for a progress note, as this is her 20th visit. She is continuing to make incremental progress, with her ROM approximately 75-80% and strength approximately 4-/5, however pt did not appear to be completing with maximal effort. She reports that she completes her HEP daily, however her arm continues to feel heavy and she has constant high pain levels and discomfort. This session OT provided verbal and tactile cuing for technique and positioning throughout.    PLAN:  OT FREQUENCY: 2x/week  OT DURATION: 4 weeks  PLANNED INTERVENTIONS: self care/ADL training, therapeutic exercise, therapeutic activity, manual therapy, passive range of motion, splinting, electrical stimulation, patient/family education, and Re-evaluation  CONSULTED AND AGREED WITH PLAN OF CARE: Patient  PLAN FOR NEXT SESSION: follow up on HEP, standing/sitting AA/ROM and A/ROM, lacing, ball on the wall, arms on fire, add shoulder strengthening with light dumbbells or theraband, therapy  ball exercises   Paulita Fujita, OTR/L [phone] 02/07/2023, 9:08 AM

## 2023-02-09 ENCOUNTER — Ambulatory Visit (INDEPENDENT_AMBULATORY_CARE_PROVIDER_SITE_OTHER): Payer: Medicaid Other | Admitting: Orthopaedic Surgery

## 2023-02-09 ENCOUNTER — Ambulatory Visit (INDEPENDENT_AMBULATORY_CARE_PROVIDER_SITE_OTHER): Payer: Medicare Other

## 2023-02-09 DIAGNOSIS — M19012 Primary osteoarthritis, left shoulder: Secondary | ICD-10-CM | POA: Diagnosis not present

## 2023-02-09 NOTE — Progress Notes (Signed)
Post Operative Evaluation    Procedure/Date of Surgery: Left reverse shoulder arthroplasty 12/12  Interval History:    Presents today status post left reverse shoulder arthroplasty.  She does still have a patch of numbness about the lateral aspect of the forearm although her motion and strength are continuing to improve.  She is now able to do her hair without difficulty.  PMH/PSH/Family History/Social History/Meds/Allergies:    Past Medical History:  Diagnosis Date   Asthma    Back pain    Breast cancer ([place])    Buttock pain    Family history of breast cancer 09/22/2020   Hypertension    Personal history of chemotherapy    Personal history of radiation therapy    Past Surgical History:  Procedure Laterality Date   ABDOMINAL HYSTERECTOMY     APPENDECTOMY     BREAST LUMPECTOMY WITH RADIOACTIVE SEED AND SENTINEL LYMPH NODE BIOPSY Right 09/10/2020   Procedure: RIGHT BREAST LUMPECTOMY WITH RADIOACTIVE SEED AND SENTINEL LYMPH NODE MAPPING;  Surgeon: Erroll Luna, MD;  Location: [place];  Service: General;  Laterality: Right;   PORTACATH PLACEMENT Right 10/16/2020   Procedure: INSERTION PORT-A-CATH WITH ULTRASOUND GUIDANCE;  Surgeon: Erroll Luna, MD;  Location: [place];  Service: General;  Laterality: Right;   RE-EXCISION OF BREAST LUMPECTOMY Right 10/16/2020   Procedure: RE-EXCISION OF RIGHT BREAST LUMPECTOMY;  Surgeon: Erroll Luna, MD;  Location: [place];  Service: General;  Laterality: Right;   REVERSE SHOULDER ARTHROPLASTY Left 11/16/2022   Procedure: LEFT REVERSE SHOULDER ARTHROPLASTY;  Surgeon: Vanetta Mulders, MD;  Location: [place];  Service: Orthopedics;  Laterality: Left;   Social History   Socioeconomic History   Marital status: Single    Spouse name: Not on file   Number of children: Not on file   Years of education: Not on file   Highest education  level: Not on file  Occupational History   Not on file  Tobacco Use   Smoking status: Former    Packs/day: 0.50    Years: 35.00    Total pack years: 17.50    Types: Cigarettes    Quit date: 08/22/2020    Years since quitting: 2.4   Smokeless tobacco: Never  Vaping Use   Vaping Use: Never used  Substance and Sexual Activity   Alcohol use: No   Drug use: Never   Sexual activity: Not Currently    Birth control/protection: Surgical  Other Topics Concern   Not on file  Social History Narrative   Not on file   Social Determinants of Health   Financial Resource Strain: Not on file  Food Insecurity: Not on file  Transportation Needs: Not on file  Physical Activity: Not on file  Stress: Not on file  Social Connections: Not on file   Family History  Problem Relation Age of Onset   Hypertension Father    Hypertension Sister    Breast cancer Maternal Aunt 33   Breast cancer Paternal Aunt        dx at unknown age   Hypertension Maternal Grandmother    Breast cancer Other        MGM's niece; dx 38s   Breast cancer Other        MGF's niece   Allergies  Allergen Reactions  Oxycodone Itching and Swelling   Current Outpatient Medications  Medication Sig Dispense Refill   ADVAIR HFA 115-21 MCG/ACT inhaler Inhale 2 puffs into the lungs 2 (two) times daily.     albuterol (VENTOLIN HFA) 108 (90 Base) MCG/ACT inhaler Inhale 2 puffs into the lungs every 4 (four) hours as needed for wheezing or shortness of breath. 8 g 3   aspirin EC 325 MG tablet Take 1 tablet (325 mg total) by mouth daily. 30 tablet 0   baclofen (LIORESAL) 10 MG tablet Take 1 tablet (10 mg total) by mouth at bedtime as needed for muscle spasms. 90 each 1   budesonide-formoterol (SYMBICORT) 160-4.5 MCG/ACT inhaler Inhale 2 puffs into the lungs in the morning and at bedtime. 10.2 g 5   calcium citrate (CALCITRATE - DOSED IN MG ELEMENTAL CALCIUM) 950 (200 Ca) MG tablet Take 200 mg of elemental calcium by mouth daily.      Cholecalciferol (VITAMIN D3) 125 MCG (5000 UT) TABS Take 1 tablet by mouth daily.     clonazePAM (KLONOPIN) 0.5 MG tablet Take 0.5 mg by mouth daily as needed.     escitalopram (LEXAPRO) 10 MG tablet Take 10 mg by mouth daily.     gabapentin (NEURONTIN) 300 MG capsule Take 1 capsule (300 mg total) by mouth 3 (three) times daily. 90 capsule 2   HYDROcodone-acetaminophen (NORCO/VICODIN) 5-325 MG tablet Take 1 tablet by mouth every 6 (six) hours as needed for moderate pain. 20 tablet 0   HYDROcodone-acetaminophen (NORCO/VICODIN) 5-325 MG tablet Take 1 tablet by mouth every 6 (six) hours as needed. 15 tablet 0   letrozole (FEMARA) 2.5 MG tablet TAKE 1 TABLET ONCE DAILY. 90 tablet 3   lisinopril (ZESTRIL) 10 MG tablet Take 1 tablet (10 mg total) by mouth daily.     oxyCODONE (OXY IR/ROXICODONE) 5 MG immediate release tablet Take 1 tablet (5 mg total) by mouth every 4 (four) hours as needed (severe pain). 20 tablet 0   prazosin (MINIPRESS) 1 MG capsule Take 1 mg by mouth at bedtime.     predniSONE (DELTASONE) 50 MG tablet Take one tablet daily for 4 days 4 tablet 0   No current facility-administered medications for this visit.   No results found.  Review of Systems:   A ROS was performed including pertinent positives and negatives as documented in the HPI.   Musculoskeletal Exam:    There were no vitals taken for this visit.  Left incision is healed.  While standing she is able to forward elevate to 130 degrees.  External rotation is to 30 degrees in the sitting position internal rotation to back pocket.  There is some numbness in the musculocutaneous distribution although she is able to fire all 3 heads of the deltoid.  Imaging:    X-ray left shoulder 3 views: Well-appearing without evidence of complication  I personally reviewed and interpreted the radiographs.   Assessment:   52 year old female who is 12 weeks status post left reverse shoulder arthroplasty, overall doing well.   Given the fact that she does have some numbness in the C6 distribution and she has been working on physical therapy I do believe an MRI of the cervical spine is needed at this point to rule out any type of cervical radiculopathy that may be interfering or hampering her ability to rehab.  I will plan to see her back following this to discuss results  Plan :    -Return to clinic following MRI cervical spine  I personally saw and evaluated the patient, and participated in the management and treatment plan.  Vanetta Mulders, MD Attending Physician, Orthopedic Surgery  This document was dictated using Dragon voice recognition software. A reasonable attempt at proof reading has been made to minimize errors.

## 2023-02-14 ENCOUNTER — Encounter (HOSPITAL_COMMUNITY): Payer: Self-pay | Admitting: Occupational Therapy

## 2023-02-14 ENCOUNTER — Ambulatory Visit (HOSPITAL_COMMUNITY): Payer: Medicare Other | Admitting: Occupational Therapy

## 2023-02-14 DIAGNOSIS — M25512 Pain in left shoulder: Secondary | ICD-10-CM

## 2023-02-14 DIAGNOSIS — R29898 Other symptoms and signs involving the musculoskeletal system: Secondary | ICD-10-CM

## 2023-02-14 DIAGNOSIS — M25612 Stiffness of left shoulder, not elsewhere classified: Secondary | ICD-10-CM

## 2023-02-14 NOTE — Therapy (Signed)
OUTPATIENT OCCUPATIONAL THERAPY ORTHO TREATMENT AND PROGRESS NOTE   Patient Name: Monica [name] MRN: [id number] DOB:May 31, 1971, 52 y.o., female Today's Date: 02/14/2023  PCP: Royce Macadamia, PA-C REFERRING PROVIDER: Vanetta Mulders, MD   END OF SESSION:  OT End of Session - 02/14/23 0910     Visit Number 21    Number of Visits 25    Date for OT Re-Evaluation 02/18/23    Authorization Type [employer]    Authorization Time Period 12 visits approved 11/26/22-01/25/23; 6 additional visits approved 1/29-3/15/24, 6 additional visits approved 11/26/22-03/11/23    Authorization - Visit Number 1    Authorization - Number of Visits 6    OT Start Time 0905    OT Stop Time 0945    OT Time Calculation (min) 40 min    Activity Tolerance Patient tolerated treatment well    Behavior During Therapy [facility] for tasks assessed/performed             Past Medical History:  Diagnosis Date   Asthma    Back pain    Breast cancer ([place])    Buttock pain    Family history of breast cancer 09/22/2020   Hypertension    Personal history of chemotherapy    Personal history of radiation therapy    Past Surgical History:  Procedure Laterality Date   ABDOMINAL HYSTERECTOMY     APPENDECTOMY     BREAST LUMPECTOMY WITH RADIOACTIVE SEED AND SENTINEL LYMPH NODE BIOPSY Right 09/10/2020   Procedure: RIGHT BREAST LUMPECTOMY WITH RADIOACTIVE SEED AND SENTINEL [place];  Surgeon: Erroll Luna, MD;  Location: [place];  Service: General;  Laterality: Right;   PORTACATH PLACEMENT Right 10/16/2020   Procedure: INSERTION PORT-A-CATH WITH ULTRASOUND GUIDANCE;  Surgeon: Erroll Luna, MD;  Location: [place];  Service: General;  Laterality: Right;   RE-EXCISION OF BREAST LUMPECTOMY Right 10/16/2020   Procedure: RE-EXCISION OF RIGHT BREAST LUMPECTOMY;  Surgeon: Erroll Luna, MD;  Location: [place];  Service: General;  Laterality: Right;    REVERSE SHOULDER ARTHROPLASTY Left 11/16/2022   Procedure: LEFT REVERSE SHOULDER ARTHROPLASTY;  Surgeon: Vanetta Mulders, MD;  Location: [facility];  Service: Orthopedics;  Laterality: Left;   Patient Active Problem List   Diagnosis Date Noted   Primary osteoarthritis, left shoulder 11/16/2022   Spondylolisthesis at L5-S1 level 01/04/2022   Lumbar radiculopathy 01/04/2022   Trochanteric bursitis of left hip 11/10/2021   Muscle cramp, nocturnal 11/10/2021   Port-A-Cath in place 12/29/2020   Genetic testing 10/08/2020   Family history of breast cancer 09/22/2020   Primary malignant neoplasm of upper outer quadrant of female breast, right ([place]) 08/27/2020    ONSET DATE: 11/16/22  REFERRING DIAG: s/p left reverse TSA  THERAPY DIAG:  Acute pain of left shoulder  Stiffness of left shoulder, not elsewhere classified  Other symptoms and signs involving the musculoskeletal system  Rationale for Evaluation and Treatment: Rehabilitation  SUBJECTIVE:   SUBJECTIVE STATEMENT: S: "I'm feeling pretty good"  PERTINENT HISTORY: Pt is s/p left reverse TSA on 11/16/22. Pt reports she has been wearing the blue sling and has not straightened her arm since the surgery. She has high pain and is fearful of movement due to potential pain. Pt was referred to occupational therapy for evaluation and treatment by Dr. Vanetta Mulders.   PRECAUTIONS: Shoulder; AA/ROM and progress as tolerated  WEIGHT BEARING RESTRICTIONS: Yes NWB  PAIN:  Are you having pain? Yes: NPRS scale: 8/10 Pain location:  Bicep area Pain description: Aching Aggravating factors: anything Relieving factors: medication  FALLS: Has patient fallen in last 6 months? No  PATIENT GOALS: To be able to use the LUE as her non-dominant.   OBJECTIVE:   HAND DOMINANCE: Right  ADLs: Overall ADLs: Pt reports everything is difficult-dressing, putting her hair wrap on, cooking, sweeping, putting shoes on, bathing tasks.  Pt with difficulty sleeping, is sleeping on her couch.    FUNCTIONAL OUTCOME MEASURES: Quick Dash: 97.73 12/31/22: 70.45 01/21/23: 61.36   UPPER EXTREMITY ROM:     Passive ROM Left eval Left 12/21/22 Left 12/31/22  Shoulder flexion 43 121 135  Shoulder abduction 42 136 130  Shoulder internal rotation 59 90 90  Shoulder external rotation -34 17 45  (Blank rows = not tested)      Active ROM Left eval Left 12/21/22 Left 12/31/22 Left 01/21/23 seated Left 02/07/23 seated  Shoulder flexion  116 104 115 155  Shoulder abduction  105 78 106 128  Shoulder internal rotation  90 90 90   Shoulder external rotation  '7 25 17   '$ (Blank rows = not tested)   UPPER EXTREMITY MMT:     MMT Left eval Left 12/31/22 Left 01/21/23 Left 02/07/23  Shoulder flexion  3-/5 3/5 4-/5  Shoulder abduction  3-/5 3/5 4-/5  Shoulder internal rotation  3/5 3/5 4-/5  Shoulder external rotation  3-/5 3/5 4-/5  (Blank rows = not tested)  HAND FUNCTION: Grip strength: Right: 20 lbs; Left: 0 lbs Grip Strength: Right: 30 lbs; Left: 14 lbs  SENSATION: Pt reports tingling pins/needles in her left hand.   EDEMA: Intermittent   OBSERVATIONS: pt with max fascial restrictions in left upper arm, anterior shoulder, trapezius, and scapular regions. Pt with max guarding and fear of pain with movement.  12/31/22: mod fascial restrictions, one large muscle knot at upper trapezius   TODAY'S TREATMENT:                                                                                                                              DATE:  02/14/23 -A/ROM: seated, flexion, abduction, protraction, horizontal abduction, er/IR, x10 -Stretching: er on the wall, bicep on the wall, 2x20" -Shoulder strengthening: 2lb dumbbells, flexion, abduction, protraction, horizontal abduction, er/IR, x10 -Ball Roll on the wall: flexion x15 -Therapy Ball Exercises: chest press, flexion, V ups, overhead press, circles both directions,  x10  02/07/23 -AA/ROM: seated, flexion, abduction, protraction, horizontal abduction, er/IR, x10 -A/ROM: seated, flexion, abduction, protraction, horizontal abduction, er/IR, x10 -Arms on fire 2x60" -Therapy Ball Exercises: chest press, flexion, V ups, overhead press, circles both directions, x10  02/03/23 -A/ROM: seated, flexion, abduction, protraction, horizontal abduction, er/IR, x10 -Scapular Strengthening: red theraband, extension, retraction, rows, x10 -Shoulder Strengthening: red theraband, flexion, abduction, horizontal abduction, er/IR, x10 -Therapy Ball Strengthening: chest press, flexion, circles both directions, x10     PATIENT EDUCATION: Education details: Review HEP Person educated: Patient and Parent Education method: Explanation,  Demonstration, and Handouts Education comprehension: verbalized understanding and returned demonstration  HOME EXERCISE PROGRAM: Eval: Pendulums, elbow AA/ROM, wrist A/ROM 12/26: Table Slides 1/2: AA/ROM 1/4: Isometrics 1/12: Wall Slides 1/16: A/ROM 1/19: Scapular Strengthening 1/29: shoulder stretches 2/6: Loop Band exercises 2/13: Shoulder Strengthening with dumbbells 2/29: Shoulder strengthening with theraband's 3/4: therapy ball exercises  GOALS: Goals reviewed with patient? Yes  SHORT TERM GOALS: Target date: 12/23/22  Pt will be provided with and educated on HEP to improve ability to use LUE as assist during ADL tasks.   Goal status: IN PROGRESS  2.  Pt will increase P/ROM of LUE to at least 50% ROM to improve ability to perform dressing tasks using compensatory strategies and minimial physical assistance.   Goal status: MET  3.  Pt will increase LUE strength to 3-/5 to improve ability to use LUE to reach items at waist height during ADLs such as eating or grooming.   Goal status: MET  4.  Pt will decrease LUE fascial restrictions to mod amounts or less to improve ability to perform low level functional reaching tasks  and improve tolerance to HEP and ROM tasks.   Goal status: MET    LONG TERM GOALS: Target date: 01/23/23  Pt will decrease pain in LUE to 4/10 or less to improve ability to sleep in the bed for at least 2 hours at night.   Goal status: IN PROGRESS  2.  Pt will increase A/ROM of LUE to at least 75% range to improve ability to reach up and operate her head wrap and reach back to adjust her pants.   Goal status: IN PROGRESS  3.  Pt will decrease LUE fascial restrictions to minimal amounts or less to improve ability to perform functional reaching tasks when getting items out of the cabinets during meal preparation or when getting items from a closet.  Goal status: IN PROGRESS  4.  Pt will increase LUE strength to a 4/5 or greater to improve ability to lift pots and pans and to perform housekeeping tasks.   Goal status: IN PROGRESS    ASSESSMENT:  CLINICAL IMPRESSION: This session pt presenting with lower pain levels, along with increased ROM. She is reaching approximately 90% of full ROM during A/ROM exercises. She continues to have increased weakness, unable to use 3lb dumbbells to complete shoulder strengthening exercises. With increased exercises this session, she required increased rest breaks due to muscle fatigue. Her pain levels continued to remain low throughout the session. Verbal and tactile cuing provided for positioning and technique throughout session.     PLAN:  OT FREQUENCY: 2x/week  OT DURATION: 4 weeks  PLANNED INTERVENTIONS: self care/ADL training, therapeutic exercise, therapeutic activity, manual therapy, passive range of motion, splinting, electrical stimulation, patient/family education, and Re-evaluation  CONSULTED AND AGREED WITH PLAN OF CARE: Patient  PLAN FOR NEXT SESSION: follow up on HEP, standing/sitting AA/ROM and A/ROM, lacing, ball on the wall, arms on fire, add shoulder strengthening with light dumbbells or theraband, therapy ball  exercises   Paulita Fujita, OTR/L [phone] 02/14/2023, 9:12 AM

## 2023-02-16 ENCOUNTER — Ambulatory Visit (HOSPITAL_COMMUNITY)
Admission: RE | Admit: 2023-02-16 | Discharge: 2023-02-16 | Disposition: A | Discharge status: Home / Self Care | Payer: Medicare Other | Source: Ambulatory Visit | Attending: *Deleted | Admitting: *Deleted

## 2023-02-16 ENCOUNTER — Encounter: Payer: Self-pay | Admitting: Physical Medicine and Rehabilitation

## 2023-02-16 DIAGNOSIS — R918 Other nonspecific abnormal finding of lung field: Secondary | ICD-10-CM | POA: Diagnosis present

## 2023-02-21 ENCOUNTER — Encounter (HOSPITAL_COMMUNITY): Payer: Self-pay | Admitting: Occupational Therapy

## 2023-02-21 ENCOUNTER — Ambulatory Visit (HOSPITAL_COMMUNITY): Payer: Medicare Other | Admitting: Occupational Therapy

## 2023-02-21 DIAGNOSIS — M25512 Pain in left shoulder: Secondary | ICD-10-CM

## 2023-02-21 DIAGNOSIS — M25612 Stiffness of left shoulder, not elsewhere classified: Secondary | ICD-10-CM

## 2023-02-21 DIAGNOSIS — R29898 Other symptoms and signs involving the musculoskeletal system: Secondary | ICD-10-CM

## 2023-02-21 NOTE — Patient Instructions (Signed)

## 2023-02-21 NOTE — Therapy (Unsigned)
OUTPATIENT OCCUPATIONAL THERAPY ORTHO TREATMENT NOTE   Patient Name: Monica [name] MRN: [id number] DOB:09-20-1971, 52 y.o., female Today's Date: 02/21/2023  PCP: Royce Macadamia, PA-C REFERRING PROVIDER: Vanetta Mulders, MD   END OF SESSION:  OT End of Session - 02/21/23 0954     Visit Number 22    Number of Visits 25    Date for OT Re-Evaluation 02/18/23    Authorization Type [employer]    Authorization Time Period 12 visits approved 11/26/22-01/25/23; 6 additional visits approved 1/29-3/15/24, 6 additional visits approved 11/26/22-03/11/23    Authorization - Visit Number 2    Authorization - Number of Visits 6    OT Start Time 0950    OT Stop Time 1030    OT Time Calculation (min) 40 min    Activity Tolerance Patient tolerated treatment well    Behavior During Therapy [facility] for tasks assessed/performed             Past Medical History:  Diagnosis Date   Asthma    Back pain    Breast cancer ([place])    Buttock pain    Family history of breast cancer 09/22/2020   Hypertension    Personal history of chemotherapy    Personal history of radiation therapy    Past Surgical History:  Procedure Laterality Date   ABDOMINAL HYSTERECTOMY     APPENDECTOMY     BREAST LUMPECTOMY WITH RADIOACTIVE SEED AND SENTINEL LYMPH NODE BIOPSY Right 09/10/2020   Procedure: RIGHT BREAST LUMPECTOMY WITH RADIOACTIVE SEED AND SENTINEL [place];  Surgeon: Erroll Luna, MD;  Location: [place];  Service: General;  Laterality: Right;   PORTACATH PLACEMENT Right 10/16/2020   Procedure: INSERTION PORT-A-CATH WITH ULTRASOUND GUIDANCE;  Surgeon: Erroll Luna, MD;  Location: [place];  Service: General;  Laterality: Right;   RE-EXCISION OF BREAST LUMPECTOMY Right 10/16/2020   Procedure: RE-EXCISION OF RIGHT BREAST LUMPECTOMY;  Surgeon: Erroll Luna, MD;  Location: [place];  Service: General;  Laterality: Right;   REVERSE  SHOULDER ARTHROPLASTY Left 11/16/2022   Procedure: LEFT REVERSE SHOULDER ARTHROPLASTY;  Surgeon: Vanetta Mulders, MD;  Location: [place];  Service: Orthopedics;  Laterality: Left;   Patient Active Problem List   Diagnosis Date Noted   Primary osteoarthritis, left shoulder 11/16/2022   Spondylolisthesis at L5-S1 level 01/04/2022   Lumbar radiculopathy 01/04/2022   Trochanteric bursitis of left hip 11/10/2021   Muscle cramp, nocturnal 11/10/2021   Port-A-Cath in place 12/29/2020   Genetic testing 10/08/2020   Family history of breast cancer 09/22/2020   Primary malignant neoplasm of upper outer quadrant of female breast, right ([place]) 08/27/2020    ONSET DATE: 11/16/22  REFERRING DIAG: s/p left reverse TSA  THERAPY DIAG:  Acute pain of left shoulder  Stiffness of left shoulder, not elsewhere classified  Other symptoms and signs involving the musculoskeletal system  Rationale for Evaluation and Treatment: Rehabilitation  SUBJECTIVE:   SUBJECTIVE STATEMENT: S: "Today is better than this weekend was."  PERTINENT HISTORY: Pt is s/p left reverse TSA on 11/16/22. Pt reports she has been wearing the blue sling and has not straightened her arm since the surgery. She has high pain and is fearful of movement due to potential pain. Pt was referred to occupational therapy for evaluation and treatment by Dr. Vanetta Mulders.   PRECAUTIONS: Shoulder; AA/ROM and progress as tolerated  WEIGHT BEARING RESTRICTIONS: Yes NWB  PAIN:  Are you having pain? Yes: NPRS scale: 3/10 Pain  location: Bicep area Pain description: Aching Aggravating factors: anything Relieving factors: medication  FALLS: Has patient fallen in last 6 months? No  PATIENT GOALS: To be able to use the LUE as her non-dominant.   OBJECTIVE:   HAND DOMINANCE: Right  ADLs: Overall ADLs: Pt reports everything is difficult-dressing, putting her hair wrap on, cooking, sweeping, putting shoes on, bathing  tasks. Pt with difficulty sleeping, is sleeping on her couch.    FUNCTIONAL OUTCOME MEASURES: Quick Dash: 97.73 12/31/22: 70.45 01/21/23: 61.36   UPPER EXTREMITY ROM:     Passive ROM Left eval Left 12/21/22 Left 12/31/22  Shoulder flexion 43 121 135  Shoulder abduction 42 136 130  Shoulder internal rotation 59 90 90  Shoulder external rotation -34 17 45  (Blank rows = not tested)      Active ROM Left eval Left 12/21/22 Left 12/31/22 Left 01/21/23 seated Left 02/07/23 seated  Shoulder flexion  116 104 115 155  Shoulder abduction  105 78 106 128  Shoulder internal rotation  90 90 90   Shoulder external rotation  7 25 17    (Blank rows = not tested)   UPPER EXTREMITY MMT:     MMT Left eval Left 12/31/22 Left 01/21/23 Left 02/07/23  Shoulder flexion  3-/5 3/5 4-/5  Shoulder abduction  3-/5 3/5 4-/5  Shoulder internal rotation  3/5 3/5 4-/5  Shoulder external rotation  3-/5 3/5 4-/5  (Blank rows = not tested)  HAND FUNCTION: Grip strength: Right: 20 lbs; Left: 0 lbs Grip Strength: Right: 30 lbs; Left: 14 lbs  SENSATION: Pt reports tingling pins/needles in her left hand.   EDEMA: Intermittent   OBSERVATIONS: pt with max fascial restrictions in left upper arm, anterior shoulder, trapezius, and scapular regions. Pt with max guarding and fear of pain with movement.  12/31/22: mod fascial restrictions, one large muscle knot at upper trapezius   TODAY'S TREATMENT:                                                                                                                              DATE:  02/21/23 -A/ROM: seated, flexion, abduction, protraction, horizontal abduction, er/IR, x10 -Shoulder strengthening: 2lb and 3lb dumbbells, flexion(3lb), abduction(2lb), protraction(3lb), horizontal abduction(2lb), er/IR(3lb), x10 -Loop Band Exercise: Green Theraband, Wall Taps, Wall V ups, Wall Clocks, x10 -PNF strengthening: Green theraband, chest pulls, er pulls, PNF up, PNF down,  x10  02/14/23 -A/ROM: seated, flexion, abduction, protraction, horizontal abduction, er/IR, x10 -Stretching: er on the wall, bicep on the wall, 2x20" -Shoulder strengthening: 2lb dumbbells, flexion, abduction, protraction, horizontal abduction, er/IR, x10 -Ball Roll on the wall: flexion x15 -Therapy Ball Exercises: chest press, flexion, V ups, overhead press, circles both directions, x10  02/07/23 -AA/ROM: seated, flexion, abduction, protraction, horizontal abduction, er/IR, x10 -A/ROM: seated, flexion, abduction, protraction, horizontal abduction, er/IR, x10 -Arms on fire 2x60" -Therapy Ball Exercises: chest press, flexion, V ups, overhead press, circles both directions, x10    PATIENT EDUCATION: Education  details: PNF Strengthening Person educated: Patient and Parent Education method: Explanation, Demonstration, and Handouts Education comprehension: verbalized understanding and returned demonstration  HOME EXERCISE PROGRAM: Eval: Pendulums, elbow AA/ROM, wrist A/ROM 12/26: Table Slides 1/2: AA/ROM 1/4: Isometrics 1/12: Wall Slides 1/16: A/ROM 1/19: Scapular Strengthening 1/29: shoulder stretches 2/6: Loop Band exercises 2/13: Shoulder Strengthening with dumbbells 2/29: Shoulder strengthening with theraband's 3/4: therapy ball exercises 3/18: PNF strengthening  GOALS: Goals reviewed with patient? Yes  SHORT TERM GOALS: Target date: 12/23/22  Pt will be provided with and educated on HEP to improve ability to use LUE as assist during ADL tasks.   Goal status: IN PROGRESS  2.  Pt will increase P/ROM of LUE to at least 50% ROM to improve ability to perform dressing tasks using compensatory strategies and minimial physical assistance.   Goal status: MET  3.  Pt will increase LUE strength to 3-/5 to improve ability to use LUE to reach items at waist height during ADLs such as eating or grooming.   Goal status: MET  4.  Pt will decrease LUE fascial restrictions to mod  amounts or less to improve ability to perform low level functional reaching tasks and improve tolerance to HEP and ROM tasks.   Goal status: MET    LONG TERM GOALS: Target date: 01/23/23  Pt will decrease pain in LUE to 4/10 or less to improve ability to sleep in the bed for at least 2 hours at night.   Goal status: IN PROGRESS  2.  Pt will increase A/ROM of LUE to at least 75% range to improve ability to reach up and operate her head wrap and reach back to adjust her pants.   Goal status: IN PROGRESS  3.  Pt will decrease LUE fascial restrictions to minimal amounts or less to improve ability to perform functional reaching tasks when getting items out of the cabinets during meal preparation or when getting items from a closet.  Goal status: IN PROGRESS  4.  Pt will increase LUE strength to a 4/5 or greater to improve ability to lift pots and pans and to perform housekeeping tasks.   Goal status: IN PROGRESS    ASSESSMENT:  CLINICAL IMPRESSION: This session pt presenting with lower pain levels, along with increased ROM. She is reaching approximately 90% of full ROM during A/ROM exercises. She continues to have increased weakness, unable to use 3lb dumbbells to complete shoulder strengthening exercises. With increased exercises this session, she required increased rest breaks due to muscle fatigue. Her pain levels continued to remain low throughout the session. Verbal and tactile cuing provided for positioning and technique throughout session.     PLAN:  OT FREQUENCY: 2x/week  OT DURATION: 4 weeks  PLANNED INTERVENTIONS: self care/ADL training, therapeutic exercise, therapeutic activity, manual therapy, passive range of motion, splinting, electrical stimulation, patient/family education, and Re-evaluation  CONSULTED AND AGREED WITH PLAN OF CARE: Patient  PLAN FOR NEXT SESSION: follow up on HEP, standing/sitting AA/ROM and A/ROM, lacing, ball on the wall, arms on fire, add  shoulder strengthening with light dumbbells or theraband, therapy ball exercises   Paulita Fujita, OTR/L [phone] 02/21/2023, 9:55 AM

## 2023-02-23 ENCOUNTER — Ambulatory Visit (INDEPENDENT_AMBULATORY_CARE_PROVIDER_SITE_OTHER): Payer: Medicare Other | Admitting: Pulmonary Disease

## 2023-02-23 ENCOUNTER — Encounter: Payer: Self-pay | Admitting: Pulmonary Disease

## 2023-02-23 VITALS — BP 126/70 | HR 91 | Temp 98.0°F | Ht 60.0 in | Wt 198.0 lb

## 2023-02-23 DIAGNOSIS — J849 Interstitial pulmonary disease, unspecified: Secondary | ICD-10-CM | POA: Diagnosis not present

## 2023-02-23 DIAGNOSIS — R918 Other nonspecific abnormal finding of lung field: Secondary | ICD-10-CM

## 2023-02-23 DIAGNOSIS — J4540 Moderate persistent asthma, uncomplicated: Secondary | ICD-10-CM

## 2023-02-23 NOTE — Patient Instructions (Signed)
I am glad you are stable with your breathing The CT shows lung nodules that look benign and stable so that is good news Continue your inhalers Will get a CT scan high-resolution in 1 year Follow-up in 1 year after CT scan

## 2023-02-23 NOTE — Progress Notes (Signed)
Monica [name]    [id number]    06-Nov-1971  Primary Care Physician:Muse, Noel Journey., PA-C  Referring Physician: Raiford Simmonds., PA-C [address] [place],  [place] [address]  Chief complaint: Follow-up for abnormal CT   HPI: 52 y.o. who  has a past medical history of Asthma, Back pain, Breast cancer ([place]), Buttock pain, Family history of breast cancer (09/22/2020), Hypertension, Personal history of chemotherapy, and Personal history of radiation therapy.   Referred for evaluation of abnormal CT.  History notable for asthma which is not under control She has daily symptoms of dyspnea, wheezing, nocturnal awakenings 2-3 times every night.  She is currently on just albuterol inhaler Increase symptoms on exposure to grass mowing, heat and humidity.  Has history of seasonal allergies, denies GERD symptoms  Diagnosed with breast cancer in September 2021 which is treated with lumpectomy, paclitaxel and trastuzumab.  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  No feather pillows or comforters ILD exposure questionnaire 06/22/2022-negative Smoking history: 18 pack-year smoker.  Quit smoking in 2023 Travel history: No significant travel history Relevant family history: No family history of lung disease  Interim history: Here for review of CT scan States that breathing is stable without any issues.  Outpatient Encounter Medications as of 02/23/2023  Medication Sig   ADVAIR HFA 115-21 MCG/ACT inhaler Inhale 2 puffs into the lungs 2 (two) times daily.   albuterol (VENTOLIN HFA) 108 (90 Base) MCG/ACT inhaler Inhale 2 puffs into the lungs every 4 (four) hours as needed for wheezing or shortness of breath.   baclofen (LIORESAL) 10 MG tablet Take 1 tablet (10 mg total) by mouth at bedtime as needed for muscle spasms.    budesonide-formoterol (SYMBICORT) 160-4.5 MCG/ACT inhaler Inhale 2 puffs into the lungs in the morning and at bedtime.   calcium citrate (CALCITRATE - DOSED IN MG ELEMENTAL CALCIUM) 950 (200 Ca) MG tablet Take 200 mg of elemental calcium by mouth daily.   Cholecalciferol (VITAMIN D3) 125 MCG (5000 UT) TABS Take 1 tablet by mouth daily.   clonazePAM (KLONOPIN) 0.5 MG tablet Take 0.5 mg by mouth daily as needed.   escitalopram (LEXAPRO) 10 MG tablet Take 10 mg by mouth daily.   gabapentin (NEURONTIN) 300 MG capsule Take 1 capsule (300 mg total) by mouth 3 (three) times daily.   letrozole (FEMARA) 2.5 MG tablet TAKE 1 TABLET ONCE DAILY.   lisinopril (ZESTRIL) 10 MG tablet Take 1 tablet (10 mg total) by mouth daily.   prazosin (MINIPRESS) 1 MG capsule Take 1 mg by mouth at bedtime.   [DISCONTINUED] aspirin EC 325 MG tablet Take 1 tablet (325 mg total) by mouth daily.   [DISCONTINUED] HYDROcodone-acetaminophen (NORCO/VICODIN) 5-325 MG tablet Take 1 tablet by mouth every 6 (six) hours as needed for moderate pain.   [DISCONTINUED] HYDROcodone-acetaminophen (NORCO/VICODIN) 5-325 MG tablet Take 1 tablet by mouth every 6 (six) hours as needed.   [DISCONTINUED] oxyCODONE (OXY IR/ROXICODONE) 5 MG immediate release tablet Take 1 tablet (5 mg total) by mouth every 4 (four) hours as needed (severe pain).   [DISCONTINUED] predniSONE (DELTASONE) 50 MG tablet Take one tablet daily for 4 days   No facility-administered encounter medications on file as of 02/23/2023.    Physical Exam: Blood pressure 126/70, pulse 91, temperature 98 F (36.7 C),  temperature source Oral, height 5' (1.524 m), weight 198 lb (89.8 kg), SpO2 96 %. Gen:      No acute distress HEENT:  EOMI, sclera anicteric Neck:     No masses; no thyromegaly Lungs:    Clear to auscultation bilaterally; normal respiratory effort CV:         Regular rate and rhythm; no murmurs Abd:      + bowel sounds; soft, non-tender; no palpable masses, no  distension Ext:    No edema; adequate peripheral perfusion Skin:      Warm and dry; no rash Neuro: alert and oriented x 3 Psych: normal mood and affect   Data Reviewed: Imaging: CT chest 02/15/2022-mild bronchial wall thickening with mosaic attenuation, scattered pulmonary nodules.  CT chest 05/21/2022-progressive mosaic attenuation, stable pulmonary nodules.    High-resolution CT 08/11/2022-numerous small bilateral pulmonary nodules which are stable.  Air trapping.  Mild bronchiectasis.  CT chest 02/16/2023-stable bilateral pulmonary nodules I have reviewed the images personally.  PFTs: 08/20/2022 FVC 2.27 (61%], TLC 3.71 [70%] Mild restriction Unable to complete spirometry and diffusion capacity.  Labs: CBC 06/19/2022 WBC 8.9, eosinophils 1.1%, absolute eosinophil count 98 IgE 06/22/2022-250  Cardiac: Echocardiogram 10/06/2021-LVEF 60 to 65%.  Normal RV systolic function and size  Assessment:  Assessment for abnormal CT, concern for interstitial lung disease Moderate persistent asthma I reviewed his CT which shows mild groundglass with mosaic attenuation.  Findings are very nonspecific and may represent pulmonary vascular disease, obstructive airway disease or hypersensitivity, COP, smoking-related changes. She does not have significant exposures, no pulmonary hypertension on echocardiogram last year.  There is a read of Carteret syndrome but that is a diagnosis of exclusion.  There is no evidence of fibrotic interstitial lung disease  Continue Symbicort inhaler  Lung nodules Stable on CT scan this month.  Will follow-up in 1 year.  Ex-smoker Quit in 2023.  Congratulated on quitting smoking.  Plan/Recommendations: Continue Symbicort Follow-up CT Return to clinic in 6 months  Marshell Garfinkel MD [facility] 02/23/2023, 10:00 AM  CC: Raiford Simmonds., PA-C  Thank you

## 2023-02-27 ENCOUNTER — Ambulatory Visit
Admission: RE | Admit: 2023-02-27 | Discharge: 2023-02-27 | Disposition: A | Discharge status: Home / Self Care | Payer: Medicaid Other | Source: Ambulatory Visit | Attending: Orthopaedic Surgery | Admitting: Orthopaedic Surgery

## 2023-02-27 DIAGNOSIS — M19012 Primary osteoarthritis, left shoulder: Secondary | ICD-10-CM

## 2023-02-28 ENCOUNTER — Ambulatory Visit (HOSPITAL_COMMUNITY): Payer: Medicare Other | Admitting: Occupational Therapy

## 2023-02-28 ENCOUNTER — Encounter (HOSPITAL_COMMUNITY): Payer: Self-pay | Admitting: Occupational Therapy

## 2023-02-28 DIAGNOSIS — M25612 Stiffness of left shoulder, not elsewhere classified: Secondary | ICD-10-CM

## 2023-02-28 DIAGNOSIS — M25512 Pain in left shoulder: Secondary | ICD-10-CM | POA: Diagnosis not present

## 2023-02-28 DIAGNOSIS — R29898 Other symptoms and signs involving the musculoskeletal system: Secondary | ICD-10-CM

## 2023-02-28 NOTE — Therapy (Signed)
OUTPATIENT OCCUPATIONAL THERAPY ORTHO TREATMENT NOTE   Patient Name: Monica [name] MRN: [id number] DOB:[birth year], 52 y.o., female Today's Date: 02/28/2023  PCP: Royce Macadamia, PA-C REFERRING PROVIDER: Vanetta Mulders, MD   END OF SESSION:  OT End of Session - 02/28/23 0951     Visit Number 23    Number of Visits 25    Date for OT Re-Evaluation 02/18/23    Authorization Type [employer]    Authorization Time Period 12 visits approved 11/26/22-01/25/23; 6 additional visits approved 1/29-3/15/24, 6 additional visits approved 11/26/22-03/11/23    Authorization - Visit Number 3    Authorization - Number of Visits 6    OT Start Time [id number]    OT Stop Time 1030    OT Time Calculation (min) 40 min    Activity Tolerance Patient tolerated treatment well    Behavior During Therapy [facility] for tasks assessed/performed             Past Medical History:  Diagnosis Date   Asthma    Back pain    Breast cancer ([place])    Buttock pain    Family history of breast cancer 09/22/2020   Hypertension    Personal history of chemotherapy    Personal history of radiation therapy    Past Surgical History:  Procedure Laterality Date   ABDOMINAL HYSTERECTOMY     APPENDECTOMY     BREAST LUMPECTOMY WITH RADIOACTIVE SEED AND SENTINEL LYMPH NODE BIOPSY Right 09/10/2020   Procedure: RIGHT BREAST LUMPECTOMY WITH RADIOACTIVE SEED AND SENTINEL LYMPH NODE MAPPING;  Surgeon: Erroll Luna, MD;  Location: [place];  Service: General;  Laterality: Right;   PORTACATH PLACEMENT Right 10/16/2020   Procedure: INSERTION PORT-A-CATH WITH ULTRASOUND GUIDANCE;  Surgeon: Erroll Luna, MD;  Location: [place];  Service: General;  Laterality: Right;   RE-EXCISION OF BREAST LUMPECTOMY Right 10/16/2020   Procedure: RE-EXCISION OF RIGHT BREAST LUMPECTOMY;  Surgeon: Erroll Luna, MD;  Location: [place];  Service: General;  Laterality: Right;   REVERSE  SHOULDER ARTHROPLASTY Left 11/16/2022   Procedure: LEFT REVERSE SHOULDER ARTHROPLASTY;  Surgeon: Vanetta Mulders, MD;  Location: [place];  Service: Orthopedics;  Laterality: Left;   Patient Active Problem List   Diagnosis Date Noted   Primary osteoarthritis, left shoulder 11/16/2022   Spondylolisthesis at L5-S1 level 01/04/2022   Lumbar radiculopathy 01/04/2022   Trochanteric bursitis of left hip 11/10/2021   Muscle cramp, nocturnal 11/10/2021   Port-A-Cath in place 12/29/2020   Genetic testing 10/08/2020   Family history of breast cancer 09/22/2020   Primary malignant neoplasm of upper outer quadrant of female breast, right ([place]) 08/27/2020    ONSET DATE: 11/16/22  REFERRING DIAG: s/p left reverse TSA  THERAPY DIAG:  Acute pain of left shoulder  Stiffness of left shoulder, not elsewhere classified  Other symptoms and signs involving the musculoskeletal system  Rationale for Evaluation and Treatment: Rehabilitation  SUBJECTIVE:   SUBJECTIVE STATEMENT: S: "Today is better than this weekend was."  PERTINENT HISTORY: Pt is s/p left reverse TSA on 11/16/22. Pt reports she has been wearing the blue sling and has not straightened her arm since the surgery. She has high pain and is fearful of movement due to potential pain. Pt was referred to occupational therapy for evaluation and treatment by Dr. Vanetta Mulders.   PRECAUTIONS: Shoulder; AA/ROM and progress as tolerated  WEIGHT BEARING RESTRICTIONS: Yes NWB  PAIN:  Are you having pain? Yes: NPRS scale: 3/10 Pain  location: Bicep area Pain description: Aching Aggravating factors: anything Relieving factors: medication  FALLS: Has patient fallen in last 6 months? No  PATIENT GOALS: To be able to use the LUE as her non-dominant.   OBJECTIVE:   HAND DOMINANCE: Right  ADLs: Overall ADLs: Pt reports everything is difficult-dressing, putting her hair wrap on, cooking, sweeping, putting shoes on, bathing  tasks. Pt with difficulty sleeping, is sleeping on her couch.    FUNCTIONAL OUTCOME MEASURES: Quick Dash: 97.73 12/31/22: 70.45 01/21/23: 61.36   UPPER EXTREMITY ROM:     Passive ROM Left eval Left 12/21/22 Left 12/31/22  Shoulder flexion 43 121 135  Shoulder abduction 42 136 130  Shoulder internal rotation 59 90 90  Shoulder external rotation -34 17 45  (Blank rows = not tested)      Active ROM Left eval Left 12/21/22 Left 12/31/22 Left 01/21/23 seated Left 02/07/23 seated  Shoulder flexion  116 104 115 155  Shoulder abduction  105 78 106 128  Shoulder internal rotation  90 90 90   Shoulder external rotation  7 25 17    (Blank rows = not tested)   UPPER EXTREMITY MMT:     MMT Left eval Left 12/31/22 Left 01/21/23 Left 02/07/23  Shoulder flexion  3-/5 3/5 4-/5  Shoulder abduction  3-/5 3/5 4-/5  Shoulder internal rotation  3/5 3/5 4-/5  Shoulder external rotation  3-/5 3/5 4-/5  (Blank rows = not tested)  HAND FUNCTION: Grip strength: Right: 20 lbs; Left: 0 lbs Grip Strength: Right: 30 lbs; Left: 14 lbs  SENSATION: Pt reports tingling pins/needles in her left hand.   EDEMA: Intermittent   OBSERVATIONS: pt with max fascial restrictions in left upper arm, anterior shoulder, trapezius, and scapular regions. Pt with max guarding and fear of pain with movement.  12/31/22: mod fascial restrictions, one large muscle knot at upper trapezius   TODAY'S TREATMENT:                                                                                                                              DATE:  02/28/23 -A/ROM: seated, flexion, abduction, protraction, horizontal abduction, er/IR, x10 -Shoulder strengthening: red theraband, flexion, abduction, er, IR, horizontal abduction, x15 -scapular strengthening: green theraband, extension, retraction, protraction, rows, x15 -Lacing overhead  02/21/23 -A/ROM: seated, flexion, abduction, protraction, horizontal abduction, er/IR,  x10 -Shoulder strengthening: 2lb and 3lb dumbbells, flexion(3lb), abduction(2lb), protraction(3lb), horizontal abduction(2lb), er/IR(3lb), x10 -Loop Band Exercise: Green Theraband, Wall Taps, Wall V ups, Wall Clocks, x10 -PNF strengthening: Green theraband, chest pulls, er pulls, PNF up, PNF down, x10  02/14/23 -A/ROM: seated, flexion, abduction, protraction, horizontal abduction, er/IR, x10 -Stretching: er on the wall, bicep on the wall, 2x20" -Shoulder strengthening: 2lb dumbbells, flexion, abduction, protraction, horizontal abduction, er/IR, x10 -Ball Roll on the wall: flexion x15 -Therapy Ball Exercises: chest press, flexion, V ups, overhead press, circles both directions, x10     PATIENT EDUCATION: Education details: Review HEP Person  educated: Patient and Parent Education method: Explanation, Demonstration, and Handouts Education comprehension: verbalized understanding and returned demonstration  HOME EXERCISE PROGRAM: Eval: Pendulums, elbow AA/ROM, wrist A/ROM 12/26: Table Slides 1/2: AA/ROM 1/4: Isometrics 1/12: Wall Slides 1/16: A/ROM 1/19: Scapular Strengthening 1/29: shoulder stretches 2/6: Loop Band exercises 2/13: Shoulder Strengthening with dumbbells 2/29: Shoulder strengthening with theraband's 3/4: therapy ball exercises 3/18: PNF strengthening  GOALS: Goals reviewed with patient? Yes  SHORT TERM GOALS: Target date: 12/23/22  Pt will be provided with and educated on HEP to improve ability to use LUE as assist during ADL tasks.   Goal status: IN PROGRESS  2.  Pt will increase P/ROM of LUE to at least 50% ROM to improve ability to perform dressing tasks using compensatory strategies and minimial physical assistance.   Goal status: MET  3.  Pt will increase LUE strength to 3-/5 to improve ability to use LUE to reach items at waist height during ADLs such as eating or grooming.   Goal status: MET  4.  Pt will decrease LUE fascial restrictions to mod  amounts or less to improve ability to perform low level functional reaching tasks and improve tolerance to HEP and ROM tasks.   Goal status: MET    LONG TERM GOALS: Target date: 01/23/23  Pt will decrease pain in LUE to 4/10 or less to improve ability to sleep in the bed for at least 2 hours at night.   Goal status: IN PROGRESS  2.  Pt will increase A/ROM of LUE to at least 75% range to improve ability to reach up and operate her head wrap and reach back to adjust her pants.   Goal status: IN PROGRESS  3.  Pt will decrease LUE fascial restrictions to minimal amounts or less to improve ability to perform functional reaching tasks when getting items out of the cabinets during meal preparation or when getting items from a closet.  Goal status: IN PROGRESS  4.  Pt will increase LUE strength to a 4/5 or greater to improve ability to lift pots and pans and to perform housekeeping tasks.   Goal status: IN PROGRESS    ASSESSMENT:  CLINICAL IMPRESSION: Pt presenting to session with improving pain and ROM. Session focused on strengthening with theraband's, and endurance based task overhead. She continues to fatigue quickly with increased soreness, requiring rest breaks during tasks, however is able to sustain activities for longer before requiring breaks. OT providing verbal and tactile cuing throughout session for positioning and technique, as well as education on other stretching techniques to improve behind the back reaching.    PLAN:  OT FREQUENCY: 2x/week  OT DURATION: 4 weeks  PLANNED INTERVENTIONS: self care/ADL training, therapeutic exercise, therapeutic activity, manual therapy, passive range of motion, splinting, electrical stimulation, patient/family education, and Re-evaluation  CONSULTED AND AGREED WITH PLAN OF CARE: Patient  PLAN FOR NEXT SESSION: follow up on HEP, standing/sitting AA/ROM and A/ROM, lacing, ball on the wall, arms on fire, add shoulder strengthening with  light dumbbells or theraband, therapy ball exercises   Paulita Fujita, OTR/L [phone] 02/28/2023, 9:53 AM

## 2023-03-07 ENCOUNTER — Encounter (HOSPITAL_COMMUNITY): Payer: Self-pay | Admitting: Occupational Therapy

## 2023-03-07 ENCOUNTER — Ambulatory Visit (HOSPITAL_COMMUNITY): Payer: Medicare Other | Attending: *Deleted | Admitting: Occupational Therapy

## 2023-03-07 DIAGNOSIS — R29898 Other symptoms and signs involving the musculoskeletal system: Secondary | ICD-10-CM | POA: Diagnosis present

## 2023-03-07 DIAGNOSIS — M25612 Stiffness of left shoulder, not elsewhere classified: Secondary | ICD-10-CM | POA: Diagnosis present

## 2023-03-07 DIAGNOSIS — M25512 Pain in left shoulder: Secondary | ICD-10-CM | POA: Diagnosis present

## 2023-03-07 NOTE — Therapy (Signed)
OUTPATIENT OCCUPATIONAL THERAPY ORTHO TREATMENT NOTE   Patient Name: Monica [name] MRN: [id number] DOB:[birth year], 52 y.o., female Today's Date: 03/07/2023  PCP: Royce Macadamia, PA-C REFERRING PROVIDER: Vanetta Mulders, MD   END OF SESSION:  OT End of Session - 03/07/23 0735     Visit Number 24    Number of Visits 25    Date for OT Re-Evaluation 03/11/23    Authorization Type [employer]    Authorization Time Period 12 visits approved 11/26/22-01/25/23; 6 additional visits approved 1/29-3/15/24, 6 additional visits approved 11/26/22-03/11/23    Authorization - Visit Number 4    Authorization - Number of Visits 6    OT Start Time [id number]    OT Stop Time 0815    OT Time Calculation (min) 40 min    Activity Tolerance Patient tolerated treatment well    Behavior During Therapy [facility] for tasks assessed/performed             Past Medical History:  Diagnosis Date   Asthma    Back pain    Breast cancer    Buttock pain    Family history of breast cancer 09/22/2020   Hypertension    Personal history of chemotherapy    Personal history of radiation therapy    Past Surgical History:  Procedure Laterality Date   ABDOMINAL HYSTERECTOMY     APPENDECTOMY     BREAST LUMPECTOMY WITH RADIOACTIVE SEED AND SENTINEL LYMPH NODE BIOPSY Right 09/10/2020   Procedure: RIGHT BREAST LUMPECTOMY WITH RADIOACTIVE SEED AND SENTINEL LYMPH NODE MAPPING;  Surgeon: Erroll Luna, MD;  Location: [place];  Service: General;  Laterality: Right;   PORTACATH PLACEMENT Right 10/16/2020   Procedure: INSERTION PORT-A-CATH WITH ULTRASOUND GUIDANCE;  Surgeon: Erroll Luna, MD;  Location: [place];  Service: General;  Laterality: Right;   RE-EXCISION OF BREAST LUMPECTOMY Right 10/16/2020   Procedure: RE-EXCISION OF RIGHT BREAST LUMPECTOMY;  Surgeon: Erroll Luna, MD;  Location: [place];  Service: General;  Laterality: Right;   REVERSE SHOULDER  ARTHROPLASTY Left 11/16/2022   Procedure: LEFT REVERSE SHOULDER ARTHROPLASTY;  Surgeon: Vanetta Mulders, MD;  Location: [place];  Service: Orthopedics;  Laterality: Left;   Patient Active Problem List   Diagnosis Date Noted   Primary osteoarthritis, left shoulder 11/16/2022   Spondylolisthesis at L5-S1 level 01/04/2022   Lumbar radiculopathy 01/04/2022   Trochanteric bursitis of left hip 11/10/2021   Muscle cramp, nocturnal 11/10/2021   Port-A-Cath in place 12/29/2020   Genetic testing 10/08/2020   Family history of breast cancer 09/22/2020   Primary malignant neoplasm of upper outer quadrant of female breast, right 08/27/2020    ONSET DATE: 11/16/22  REFERRING DIAG: s/p left reverse TSA  THERAPY DIAG:  Acute pain of left shoulder  Stiffness of left shoulder, not elsewhere classified  Other symptoms and signs involving the musculoskeletal system  Rationale for Evaluation and Treatment: Rehabilitation  SUBJECTIVE:   SUBJECTIVE STATEMENT: S: "It's not hurting as bad anymore thank goodness"  PERTINENT HISTORY: Pt is s/p left reverse TSA on 11/16/22. Pt reports she has been wearing the blue sling and has not straightened her arm since the surgery. She has high pain and is fearful of movement due to potential pain. Pt was referred to occupational therapy for evaluation and treatment by Dr. Vanetta Mulders.   PRECAUTIONS: Shoulder; AA/ROM and progress as tolerated  WEIGHT BEARING RESTRICTIONS: Yes NWB  PAIN:  Are you having pain? Yes: NPRS scale: 2/10 Pain location:  Bicep area Pain description: tender Aggravating factors: anything Relieving factors: medication  FALLS: Has patient fallen in last 6 months? No  PATIENT GOALS: To be able to use the LUE as her non-dominant.   OBJECTIVE:   HAND DOMINANCE: Right  ADLs: Overall ADLs: Pt reports everything is difficult-dressing, putting her hair wrap on, cooking, sweeping, putting shoes on, bathing tasks. Pt  with difficulty sleeping, is sleeping on her couch.    FUNCTIONAL OUTCOME MEASURES: Quick Dash: 97.73 12/31/22: 70.45 01/21/23: 61.36   UPPER EXTREMITY ROM:     Passive ROM Left eval Left 12/21/22 Left 12/31/22  Shoulder flexion 43 121 135  Shoulder abduction 42 136 130  Shoulder internal rotation 59 90 90  Shoulder external rotation -34 17 45  (Blank rows = not tested)      Active ROM Left eval Left 12/21/22 Left 12/31/22 Left 01/21/23 seated Left 02/07/23 seated  Shoulder flexion  116 104 115 155  Shoulder abduction  105 78 106 128  Shoulder internal rotation  90 90 90   Shoulder external rotation  7 25 17    (Blank rows = not tested)   UPPER EXTREMITY MMT:     MMT Left eval Left 12/31/22 Left 01/21/23 Left 02/07/23  Shoulder flexion  3-/5 3/5 4-/5  Shoulder abduction  3-/5 3/5 4-/5  Shoulder internal rotation  3/5 3/5 4-/5  Shoulder external rotation  3-/5 3/5 4-/5  (Blank rows = not tested)  HAND FUNCTION: Grip strength: Right: 20 lbs; Left: 0 lbs Grip Strength: Right: 30 lbs; Left: 14 lbs  SENSATION: Pt reports tingling pins/needles in her left hand.   EDEMA: Intermittent   OBSERVATIONS: pt with max fascial restrictions in left upper arm, anterior shoulder, trapezius, and scapular regions. Pt with max guarding and fear of pain with movement.  12/31/22: mod fascial restrictions, one large muscle knot at upper trapezius   TODAY'S TREATMENT:                                                                                                                              DATE:  03/07/23 -A/ROM: standing, flexion, abduction, protraction, horizontal abduction, er/IR, x10 -Shoulder strengthening: green theraband, flexion, abduction, er, IR, horizontal abduction, x15 -scapular strengthening: green theraband, extension, retraction, protraction, rows, x15 -Therapy Ball Exercises: chest press, flexion, V ups, overhead press, circles both directions, x10 -Ball Rolls on the  wall: flexion, abduction. X10 -Functional Reaching: 2lb wrist weight, 10 items from shoulder height shelf and overhead shelf  02/28/23 -A/ROM: seated, flexion, abduction, protraction, horizontal abduction, er/IR, x10 -Shoulder strengthening: red theraband, flexion, abduction, er, IR, horizontal abduction, x15 -scapular strengthening: green theraband, extension, retraction, protraction, rows, x15 -Lacing overhead  02/21/23 -A/ROM: seated, flexion, abduction, protraction, horizontal abduction, er/IR, x10 -Shoulder strengthening: 2lb and 3lb dumbbells, flexion(3lb), abduction(2lb), protraction(3lb), horizontal abduction(2lb), er/IR(3lb), x10 -Loop Band Exercise: Green Theraband, Wall Taps, Wall V ups, Wall Clocks, x10 -PNF strengthening: Green theraband, chest pulls, er pulls, PNF up, PNF  down, x10    PATIENT EDUCATION: Education details: Review HEP Person educated: Patient and Parent Education method: Explanation, Demonstration, and Handouts Education comprehension: verbalized understanding and returned demonstration  HOME EXERCISE PROGRAM: Eval: Pendulums, elbow AA/ROM, wrist A/ROM 12/26: Table Slides 1/2: AA/ROM 1/4: Isometrics 1/12: Wall Slides 1/16: A/ROM 1/19: Scapular Strengthening 1/29: shoulder stretches 2/6: Loop Band exercises 2/13: Shoulder Strengthening with dumbbells 2/29: Shoulder strengthening with theraband's 3/4: therapy ball exercises 3/18: PNF strengthening  GOALS: Goals reviewed with patient? Yes  SHORT TERM GOALS: Target date: 12/23/22  Pt will be provided with and educated on HEP to improve ability to use LUE as assist during ADL tasks.   Goal status: IN PROGRESS  2.  Pt will increase P/ROM of LUE to at least 50% ROM to improve ability to perform dressing tasks using compensatory strategies and minimial physical assistance.   Goal status: MET  3.  Pt will increase LUE strength to 3-/5 to improve ability to use LUE to reach items at waist height  during ADLs such as eating or grooming.   Goal status: MET  4.  Pt will decrease LUE fascial restrictions to mod amounts or less to improve ability to perform low level functional reaching tasks and improve tolerance to HEP and ROM tasks.   Goal status: MET    LONG TERM GOALS: Target date: 01/23/23  Pt will decrease pain in LUE to 4/10 or less to improve ability to sleep in the bed for at least 2 hours at night.   Goal status: IN PROGRESS  2.  Pt will increase A/ROM of LUE to at least 75% range to improve ability to reach up and operate her head wrap and reach back to adjust her pants.   Goal status: IN PROGRESS  3.  Pt will decrease LUE fascial restrictions to minimal amounts or less to improve ability to perform functional reaching tasks when getting items out of the cabinets during meal preparation or when getting items from a closet.  Goal status: IN PROGRESS  4.  Pt will increase LUE strength to a 4/5 or greater to improve ability to lift pots and pans and to perform housekeeping tasks.   Goal status: IN PROGRESS    ASSESSMENT:  CLINICAL IMPRESSION: This session pt presenting with continued improving ROM and strength. She continues to have approximately 80% of full ROM, with most limitations in external rotation. Pt reporting no pain this session, only fatigue and weakness. Overall she is able to complete all exercises with limited and minimal breaks throughout session. OT providing verbal, visual, and tactile cuing throughout session for positioning and technique.    PLAN:  OT FREQUENCY: 2x/week  OT DURATION: 4 weeks  PLANNED INTERVENTIONS: self care/ADL training, therapeutic exercise, therapeutic activity, manual therapy, passive range of motion, splinting, electrical stimulation, patient/family education, and Re-evaluation  CONSULTED AND AGREED WITH PLAN OF CARE: Patient  PLAN FOR NEXT SESSION: follow up on HEP, Reassessment   Paulita Fujita,  OTR/L [phone] 03/07/2023, 7:36 AM

## 2023-03-09 ENCOUNTER — Ambulatory Visit (INDEPENDENT_AMBULATORY_CARE_PROVIDER_SITE_OTHER): Payer: Medicare Other | Admitting: Orthopaedic Surgery

## 2023-03-09 DIAGNOSIS — M19012 Primary osteoarthritis, left shoulder: Secondary | ICD-10-CM

## 2023-03-09 NOTE — Progress Notes (Signed)
Post Operative Evaluation    Procedure/Date of Surgery: Left reverse shoulder arthroplasty 12/12  Interval History:   Presents today for follow-up and discussion of her cervical MRI.  She is continuing to have pain that goes down the arm with numbness in the lateral aspect of the forearm.   PMH/PSH/Family History/Social History/Meds/Allergies:    Past Medical History:  Diagnosis Date   Asthma    Back pain    Breast cancer ([place])    Buttock pain    Family history of breast cancer 09/22/2020   Hypertension    Personal history of chemotherapy    Personal history of radiation therapy    Past Surgical History:  Procedure Laterality Date   ABDOMINAL HYSTERECTOMY     APPENDECTOMY     BREAST LUMPECTOMY WITH RADIOACTIVE SEED AND SENTINEL LYMPH NODE BIOPSY Right 09/10/2020   Procedure: RIGHT BREAST LUMPECTOMY WITH RADIOACTIVE SEED AND SENTINEL LYMPH NODE MAPPING;  Surgeon: Erroll Luna, MD;  Location: [place];  Service: General;  Laterality: Right;   PORTACATH PLACEMENT Right 10/16/2020   Procedure: INSERTION PORT-A-CATH WITH ULTRASOUND GUIDANCE;  Surgeon: Erroll Luna, MD;  Location: [place];  Service: General;  Laterality: Right;   RE-EXCISION OF BREAST LUMPECTOMY Right 10/16/2020   Procedure: RE-EXCISION OF RIGHT BREAST LUMPECTOMY;  Surgeon: Erroll Luna, MD;  Location: [place];  Service: General;  Laterality: Right;   REVERSE SHOULDER ARTHROPLASTY Left 11/16/2022   Procedure: LEFT REVERSE SHOULDER ARTHROPLASTY;  Surgeon: Vanetta Mulders, MD;  Location: [place];  Service: Orthopedics;  Laterality: Left;   Social History   Socioeconomic History   Marital status: Single    Spouse name: Not on file   Number of children: Not on file   Years of education: Not on file   Highest education level: Not on file  Occupational History   Not on file  Tobacco Use   Smoking  status: Former    Packs/day: 0.50    Years: 35.00    Total pack years: 17.50    Types: Cigarettes    Quit date: 08/22/2020    Years since quitting: 2.4   Smokeless tobacco: Never  Vaping Use   Vaping Use: Never used  Substance and Sexual Activity   Alcohol use: No   Drug use: Never   Sexual activity: Not Currently    Birth control/protection: Surgical  Other Topics Concern   Not on file  Social History Narrative   Not on file   Social Determinants of Health   Financial Resource Strain: Not on file  Food Insecurity: Not on file  Transportation Needs: Not on file  Physical Activity: Not on file  Stress: Not on file  Social Connections: Not on file   Family History  Problem Relation Age of Onset   Hypertension Father    Hypertension Sister    Breast cancer Maternal Aunt 85   Breast cancer Paternal Aunt        dx at unknown age   Hypertension Maternal Grandmother    Breast cancer Other        MGM's niece; dx 13s   Breast cancer Other        MGF's niece   Allergies  Allergen Reactions   Oxycodone Itching and Swelling   Current Outpatient Medications  Medication  Sig Dispense Refill   ADVAIR HFA 115-21 MCG/ACT inhaler Inhale 2 puffs into the lungs 2 (two) times daily.     albuterol (VENTOLIN HFA) 108 (90 Base) MCG/ACT inhaler Inhale 2 puffs into the lungs every 4 (four) hours as needed for wheezing or shortness of breath. 8 g 3   aspirin EC 325 MG tablet Take 1 tablet (325 mg total) by mouth daily. 30 tablet 0   baclofen (LIORESAL) 10 MG tablet Take 1 tablet (10 mg total) by mouth at bedtime as needed for muscle spasms. 90 each 1   budesonide-formoterol (SYMBICORT) 160-4.5 MCG/ACT inhaler Inhale 2 puffs into the lungs in the morning and at bedtime. 10.2 g 5   calcium citrate (CALCITRATE - DOSED IN MG ELEMENTAL CALCIUM) 950 (200 Ca) MG tablet Take 200 mg of elemental calcium by mouth daily.     Cholecalciferol (VITAMIN D3) 125 MCG (5000 UT) TABS Take 1 tablet by mouth  daily.     clonazePAM (KLONOPIN) 0.5 MG tablet Take 0.5 mg by mouth daily as needed.     escitalopram (LEXAPRO) 10 MG tablet Take 10 mg by mouth daily.     gabapentin (NEURONTIN) 300 MG capsule Take 1 capsule (300 mg total) by mouth 3 (three) times daily. 90 capsule 2   HYDROcodone-acetaminophen (NORCO/VICODIN) 5-325 MG tablet Take 1 tablet by mouth every 6 (six) hours as needed for moderate pain. 20 tablet 0   HYDROcodone-acetaminophen (NORCO/VICODIN) 5-325 MG tablet Take 1 tablet by mouth every 6 (six) hours as needed. 15 tablet 0   letrozole (FEMARA) 2.5 MG tablet TAKE 1 TABLET ONCE DAILY. 90 tablet 3   lisinopril (ZESTRIL) 10 MG tablet Take 1 tablet (10 mg total) by mouth daily.     oxyCODONE (OXY IR/ROXICODONE) 5 MG immediate release tablet Take 1 tablet (5 mg total) by mouth every 4 (four) hours as needed (severe pain). 20 tablet 0   prazosin (MINIPRESS) 1 MG capsule Take 1 mg by mouth at bedtime.     predniSONE (DELTASONE) 50 MG tablet Take one tablet daily for 4 days 4 tablet 0   No current facility-administered medications for this visit.   No results found.  Review of Systems:   A ROS was performed including pertinent positives and negatives as documented in the HPI.   Musculoskeletal Exam:    There were no vitals taken for this visit.  Left incision is healed.  While standing she is able to forward elevate to 130 degrees.  External rotation is to 30 degrees in the sitting position internal rotation to back pocket.  There is some numbness in the musculocutaneous distribution although she is able to fire all 3 heads of the deltoid.  Imaging:    X-ray left shoulder 3 views: Well-appearing without evidence of complication  MRI cervical spine: There is focal kyphosis at the C3-4 level with some neuroforaminal stenosis at these levels as well as he for C5  I personally reviewed and interpreted the radiographs.   Assessment:   52 year old female who is 12 weeks status post  left reverse shoulder arthroplasty, overall doing well.  I do believe that she is having some radicular type symptoms with a focal kyphosis of her cervical spine at the C3-4 level with corresponding neuroforaminal impingement at C3-4 and 4 5.  Side effect I do believe she would be a candidate for neuroforaminal injections at these levels with Dr. Laurence Spates.  Will plan to make this referral.  I did discuss that if she does  not get significant relief from this that we would consider an EMG in the future  Plan :    -Plan for 64-month follow-up for left shoulder arthroplasty and referral to Dr. Ernestina Patches      I personally saw and evaluated the patient, and participated in the management and treatment plan.  Vanetta Mulders, MD Attending Physician, Orthopedic Surgery  This document was dictated using Dragon voice recognition software. A reasonable attempt at proof reading has been made to minimize errors.

## 2023-03-10 ENCOUNTER — Encounter (HOSPITAL_COMMUNITY): Payer: Self-pay | Admitting: Occupational Therapy

## 2023-03-11 ENCOUNTER — Ambulatory Visit (HOSPITAL_COMMUNITY): Payer: Medicare Other | Admitting: Occupational Therapy

## 2023-03-11 ENCOUNTER — Encounter (HOSPITAL_COMMUNITY): Payer: Self-pay | Admitting: Occupational Therapy

## 2023-03-11 DIAGNOSIS — M25612 Stiffness of left shoulder, not elsewhere classified: Secondary | ICD-10-CM

## 2023-03-11 DIAGNOSIS — M25512 Pain in left shoulder: Secondary | ICD-10-CM

## 2023-03-11 DIAGNOSIS — R29898 Other symptoms and signs involving the musculoskeletal system: Secondary | ICD-10-CM

## 2023-03-11 NOTE — Therapy (Unsigned)
OUTPATIENT OCCUPATIONAL THERAPY ORTHO TREATMENT NOTE DISCHARGE NOTE   Patient Name: Monica [name] MRN: [id number] DOB:03/01/1971, 52 y.o., female Today's Date: 03/07/2023  PCP: Kizzie Furnishochelle Muse, PA-C REFERRING PROVIDER: Huel CoteSteven Bokshan, MD  OCCUPATIONAL THERAPY DISCHARGE SUMMARY  Visits from Start of Care: 24  Current functional level related to goals / functional outcomes: Pt met 4 out of 4 short term goals. She was provided a comprehensive HEP, her P/ROM achieved higher than 50% with least resistance, overall strength is above 3/5, and fascial restrictions are at minimal amounts in entire UE. Pt has met 2 out of 4 long term goals. Her A/ROM is approximately 75% of full ROM, where she is able to complete functional reaching at home. Additionally her fascial restrictions are at minimal amounts at this time.    Remaining deficits: Pt continues to have increased pain levels, limiting her from sleeping. She reports that she continues to sleep in a recliner due to not being able to tolerate the bed. Additionally, pt's strength continues to be limited around approximately 4-/5 to 4/5 with all motions. She is unable to tolerate more than 2lb dumbbells or the red resistance band with some slack.    Education / Equipment: Pt was provided and educated on comprehensive HEP and provided theraband's to continue progressing strengthening at home.   Plan: Patient agrees to discharge as she has met most of her goals and will be able to continue progressing at home.      END OF SESSION:  OT End of Session - 03/07/23 0735     Visit Number 24    Number of Visits 25    Date for OT Re-Evaluation 03/11/23    Authorization Type [employer]    Authorization Time Period 12 visits approved 11/26/22-01/25/23; 6 additional visits approved 1/29-3/15/24, 6 additional visits approved 11/26/22-03/11/23    Authorization - Visit Number 4    Authorization - Number of Visits 6    OT Start Time 0735     OT Stop Time 0815    OT Time Calculation (min) 40 min    Activity Tolerance Patient tolerated treatment well    Behavior [facility] tasks assessed/performed             Past Medical History:  Diagnosis Date   Asthma    Back pain    Breast cancer    Buttock pain    Family history of breast cancer 09/22/2020   Hypertension    Personal history of chemotherapy    Personal history of radiation therapy    Past Surgical History:  Procedure Laterality Date   ABDOMINAL HYSTERECTOMY     APPENDECTOMY     BREAST LUMPECTOMY WITH RADIOACTIVE SEED AND SENTINEL LYMPH NODE BIOPSY Right 09/10/2020   Procedure: RIGHT BREAST LUMPECTOMY WITH RADIOACTIVE SEED AND SENTINEL LYMPH NODE MAPPING;  Surgeon: Harriette Bouillonornett, Thomas, MD;  Location: [facility];  Service: General;  Laterality: Right;   PORTACATH PLACEMENT Right 10/16/2020   Procedure: INSERTION PORT-A-CATH WITH ULTRASOUND GUIDANCE;  Surgeon: Harriette Bouillonornett, Thomas, MD;  Location: [facility];  Service: General;  Laterality: Right;   RE-EXCISION OF BREAST LUMPECTOMY Right 10/16/2020   Procedure: RE-EXCISION OF RIGHT BREAST LUMPECTOMY;  Surgeon: Harriette Bouillonornett, Thomas, MD;  Location: [facility];  Service: General;  Laterality: Right;   REVERSE SHOULDER ARTHROPLASTY Left 11/16/2022   Procedure: LEFT REVERSE SHOULDER ARTHROPLASTY;  Surgeon: Huel CoteBokshan, Steven, MD;  Location: [facility];  Service: Orthopedics;  Laterality: Left;   Patient  Active Problem List   Diagnosis Date Noted   Primary osteoarthritis, left shoulder 11/16/2022   Spondylolisthesis at L5-S1 level 01/04/2022   Lumbar radiculopathy 01/04/2022   Trochanteric bursitis of left hip 11/10/2021   Muscle cramp, nocturnal 11/10/2021   Port-A-Cath in place 12/29/2020   Genetic testing 10/08/2020   Family history of breast cancer 09/22/2020   Primary malignant neoplasm of upper outer quadrant of female breast, right 08/27/2020    ONSET  DATE: 11/16/22  REFERRING DIAG: s/p left reverse TSA  THERAPY DIAG:  Acute pain of left shoulder  Stiffness of left shoulder, not elsewhere classified  Other symptoms and signs involving the musculoskeletal system  Rationale for Evaluation and Treatment: Rehabilitation  SUBJECTIVE:   SUBJECTIVE STATEMENT: S: "It's not hurting as bad anymore thank goodness"  PERTINENT HISTORY: Pt is s/p left reverse TSA on 11/16/22. Pt reports she has been wearing the blue sling and has not straightened her arm since the surgery. She has high pain and is fearful of movement due to potential pain. Pt was referred to occupational therapy for evaluation and treatment by Dr. Huel Cote.   PRECAUTIONS: Shoulder; AA/ROM and progress as tolerated  WEIGHT BEARING RESTRICTIONS: Yes NWB  PAIN:  Are you having pain? Yes: NPRS scale: 2/10 Pain location: Bicep area Pain description: tender Aggravating factors: anything Relieving factors: medication  FALLS: Has patient fallen in last 6 months? No  PATIENT GOALS: To be able to use the LUE as her non-dominant.   OBJECTIVE:   HAND DOMINANCE: Right  ADLs: Overall ADLs: Pt reports everything is difficult-dressing, putting her hair wrap on, cooking, sweeping, putting shoes on, bathing tasks. Pt with difficulty sleeping, is sleeping on her couch.    FUNCTIONAL OUTCOME MEASURES: Quick Dash: 97.73 12/31/22: 70.45 01/21/23: 61.36 03/11/23: 38.64   UPPER EXTREMITY ROM:     Passive ROM Left eval Left 12/21/22 Left 12/31/22  Shoulder flexion 43 121 135  Shoulder abduction 42 136 130  Shoulder internal rotation 59 90 90  Shoulder external rotation -34 17 45  (Blank rows = not tested)      Active ROM Left eval Left 12/21/22 Left 12/31/22 Left 01/21/23 seated Left 02/07/23 seated Left 03/11/23  Shoulder flexion  116 104 115 155 157  Shoulder abduction  105 78 106 128 141  Shoulder internal rotation  90 90 90  90  Shoulder external rotation  7 25  17   48  (Blank rows = not tested)   UPPER EXTREMITY MMT:     MMT Left eval Left 12/31/22 Left 01/21/23 Left 02/07/23 Left 03/11/23  Shoulder flexion  3-/5 3/5 4-/5 4-/5  Shoulder abduction  3-/5 3/5 4-/5 4/5  Shoulder internal rotation  3/5 3/5 4-/5 4/5  Shoulder external rotation  3-/5 3/5 4-/5 4-/5  (Blank rows = not tested)  HAND FUNCTION: Grip strength: Right: 20 lbs; Left: 0 lbs Grip Strength: Right: 30 lbs; Left: 14 lbs Grip Strength: 03/11/23: Left: 15lbs  SENSATION: Pt reports tingling pins/needles in her left hand.   EDEMA: No swelling noted   OBSERVATIONS: pt with max fascial restrictions in left upper arm, anterior shoulder, trapezius, and scapular regions. Pt with max guarding and fear of pain with movement.  12/31/22: mod fascial restrictions, one large muscle knot at upper trapezius   TODAY'S TREATMENT:  DATE:  03/11/23 -Wall Slides: flexion, abduction, x15 -A/ROM: standing, flexion, abduction, protraction, horizontal abduction, er/IR, x10 -Measurements for Reassessment  03/07/23 -A/ROM: standing, flexion, abduction, protraction, horizontal abduction, er/IR, x10 -Shoulder strengthening: green theraband, flexion, abduction, er, IR, horizontal abduction, x15 -scapular strengthening: green theraband, extension, retraction, protraction, rows, x15 -Therapy Ball Exercises: chest press, flexion, V ups, overhead press, circles both directions, x10 -Ball Rolls on the wall: flexion, abduction. X10 -Functional Reaching: 2lb wrist weight, 10 items from shoulder height shelf and overhead shelf  02/28/23 -A/ROM: seated, flexion, abduction, protraction, horizontal abduction, er/IR, x10 -Shoulder strengthening: red theraband, flexion, abduction, er, IR, horizontal abduction, x15 -scapular strengthening: green theraband, extension, retraction, protraction,  rows, x15 -Lacing overhead    PATIENT EDUCATION: Education details: Review HEP Person educated: Patient and Parent Education method: Explanation, Demonstration, and Handouts Education comprehension: verbalized understanding and returned demonstration  HOME EXERCISE PROGRAM: Eval: Pendulums, elbow AA/ROM, wrist A/ROM 12/26: Table Slides 1/2: AA/ROM 1/4: Isometrics 1/12: Wall Slides 1/16: A/ROM 1/19: Scapular Strengthening 1/29: shoulder stretches 2/6: Loop Band exercises 2/13: Shoulder Strengthening with dumbbells 2/29: Shoulder strengthening with theraband's 3/4: therapy ball exercises 3/18: PNF strengthening  GOALS: Goals reviewed with patient? Yes  SHORT TERM GOALS: Target date: 12/23/22  Pt will be provided with and educated on HEP to improve ability to use LUE as assist during ADL tasks.   Goal status: MET  2.  Pt will increase P/ROM of LUE to at least 50% ROM to improve ability to perform dressing tasks using compensatory strategies and minimial physical assistance.   Goal status: MET  3.  Pt will increase LUE strength to 3-/5 to improve ability to use LUE to reach items at waist height during ADLs such as eating or grooming.   Goal status: MET  4.  Pt will decrease LUE fascial restrictions to mod amounts or less to improve ability to perform low level functional reaching tasks and improve tolerance to HEP and ROM tasks.   Goal status: MET    LONG TERM GOALS: Target date: 01/23/23  Pt will decrease pain in LUE to 4/10 or less to improve ability to sleep in the bed for at least 2 hours at night.   Goal status: NOT MET  2.  Pt will increase A/ROM of LUE to at least 75% range to improve ability to reach up and operate her head wrap and reach back to adjust her pants.   Goal status: MET  3.  Pt will decrease LUE fascial restrictions to minimal amounts or less to improve ability to perform functional reaching tasks when getting items out of the cabinets during  meal preparation or when getting items from a closet.  Goal status: MET  4.  Pt will increase LUE strength to a 4/5 or greater to improve ability to lift pots and pans and to perform housekeeping tasks.   Goal status: NOT MET    ASSESSMENT:  CLINICAL IMPRESSION: Patient was seen for a reassessment this session. She is continuing to demonstrate incremental progress with shoulder ROM and strengthening. Overall she is able to achieve 75% of full ROM and reach shoulder height cabinets with minimal difficulties. Her strength continues to be limited, where she can only tolerate approximately 2lbs and scores approximately 4-/5 to 4/5 against resistance. Due to limited progress, OT and pt decided to discharge from OT at this time and focus on all the HEP at home to continue with strengthening. Pt agreed to follow up with MD as needed to address weakness,  numbness on her forearm, and continued pain/limitations. OT reviewed entirety of HEP with pt and reviewed positioning and technique.    PLAN:  OT FREQUENCY: 2x/week  OT DURATION: 4 weeks  PLANNED INTERVENTIONS: self care/ADL training, therapeutic exercise, therapeutic activity, manual therapy, passive range of motion, splinting, electrical stimulation, patient/family education, and Re-evaluation  CONSULTED AND AGREED WITH PLAN OF CARE: Patient  PLAN FOR NEXT SESSION: Discharge   Trish Mage, OTR/L [phone] 03/07/2023, 7:36 AM

## 2023-03-17 ENCOUNTER — Telehealth: Payer: Self-pay

## 2023-03-17 NOTE — Telephone Encounter (Signed)
[facility] Recertification form sent to [facility] DSS. Patient informed.

## 2023-03-21 ENCOUNTER — Encounter
Payer: Medicare Other | Attending: Physical Medicine and Rehabilitation | Admitting: Physical Medicine and Rehabilitation

## 2023-03-21 VITALS — BP 91/66 | HR 98 | Ht 60.0 in | Wt 200.4 lb

## 2023-03-21 DIAGNOSIS — F411 Generalized anxiety disorder: Secondary | ICD-10-CM | POA: Diagnosis not present

## 2023-03-21 DIAGNOSIS — M5416 Radiculopathy, lumbar region: Secondary | ICD-10-CM

## 2023-03-21 DIAGNOSIS — M5412 Radiculopathy, cervical region: Secondary | ICD-10-CM | POA: Diagnosis not present

## 2023-03-21 DIAGNOSIS — M19012 Primary osteoarthritis, left shoulder: Secondary | ICD-10-CM | POA: Diagnosis not present

## 2023-03-21 MED ORDER — CELECOXIB 100 MG PO CAPS: 100.0000 mg | ORAL_CAPSULE | Freq: Two times a day (BID) | ORAL | 2 refills | Status: DC

## 2023-03-21 NOTE — Patient Instructions (Signed)
Take celebrex 1 capsule daily for 1 week. Oif no side effects, increase to 1 capsule twice daily.Do not take with nsaids like Ibuprofen. You can take it with tylenol up toi 2 tablets 2-3x daily.   Contnue PT and have your injections done.  See me back in 2 months

## 2023-03-21 NOTE — Progress Notes (Unsigned)
Subjective:    Patient ID: Monica [name], female    DOB: [birth year], 52 y.o.   MRN: [id number]  HPI HPI  Monica [name] is a 52 y.o. year old female  who  has a past medical history of Asthma, Back pain, Breast cancer, Buttock pain, Family history of breast cancer (09/22/2020), Hypertension, Personal history of chemotherapy, and Personal history of radiation therapy.   They are presenting to PM&R clinic as a new patient for pain management evaluation. They were referred by   Huel Cote, MD  for treatment of "Post operative chronic pain, establish care and nonnarcotic pain management " for L shoulder OA.   Site: L shoulder aching extending down into her arm, with numbness and tingling in her forearm.   Red flag symptoms: No red flags for back pain endorsed in Hx or ROS  Medications tried: Topical medications (no effect) : aspercreme; never tried lidocaine patches Nsaids (mild effect) : ibuprofen, usually when it rains or when it's cold uses 2 tabs q8h.  Tylenol  (no effect) :  Opiates  (moderate effect) :  Allergic to oxycodone - caused swelling and itching Norco 5 mg - got 2 scripts post-op, helped considerably. Last 12/22/22. Was using 3 pills a week with considerable benefit, helped especially with sleep.   Gabapentin / Lyrica  (mild effect) : On gabapentin 300 mg, only taking twice per day; was prior TID but it made her too sleepy; unsure how long it lasts because it puts her to sleep from 10 pm-3 am. Is taking for a pinched nerve in her low back.  TCAs  (never tried) :  SNRIs  (never tried) :  Other  (   ) : On Doxepin, restoril, and klonopin for severe anxiety; is being seen every 2 weeks with psychiatry. Does feel things are improving but still has panic episodes frequently.   Other treatments: PT/OT  (moderate effect) : Started immediately post-op. Last was in 03/11/23; thinks it's helping, transitioning to HEP for improve  strength Accupuncture/chiropractor/massage  (moderate effect) : Massage used to help; can't get anymore.  TENs unit (never tried) :  Injections ( pending ) : scheduled 4/22 with Dr Alvester Morin for Left C7-T1 ESI; has never had before.  Surgery (no effect) : Shoulder replacement 11/2022, Dr. Steward Drone. Feels the pain when it rains or is cold is no better; otherwise may have mildly improved.  Other  (mild effect) : Sleeping with a pillow under her arm help with the aching and pain. Heating pad helps with aching.   Goals for pain control: none  Prior UDS results: No results found for: "LABOPIA", "COCAINSCRNUR", "LABBENZ", "AMPHETMU", "THCU", "LABBARB"    Pain Inventory Average Pain 9 Pain Right Now 7 My pain is sharp and aching  In the last 24 hours, has pain interfered with the following? General activity 0 Relation with others 0 Enjoyment of life 0 What TIME of day is your pain at its worst? morning , daytime, evening, and night Sleep (in general) Poor  Pain is worse with: walking, bending, sitting, and standing Pain improves with: rest and heat/ice Relief from Meds:  na  ability to climb steps?  yes do you drive?  no  disabled: date disabled . I need assistance with the following:  household duties and shopping  weakness depression anxiety  New pt  New pt    Family History  Problem Relation Age of Onset   Hypertension Father    Hypertension Sister  Breast cancer Maternal Aunt 24   Breast cancer Paternal Aunt        dx at unknown age   Hypertension Maternal Grandmother    Breast cancer Other        MGM's niece; dx 64s   Breast cancer Other        MGF's niece   Social History   Socioeconomic History   Marital status: Single    Spouse name: Not on file   Number of children: Not on file   Years of education: Not on file   Highest education level: Not on file  Occupational History   Not on file  Tobacco Use   Smoking status: Former    Packs/day: 0.50     Years: 35.00    Additional pack years: 0.00    Total pack years: 17.50    Types: Cigarettes    Quit date: 08/22/2020    Years since quitting: 2.5   Smokeless tobacco: Never  Vaping Use   Vaping Use: Never used  Substance and Sexual Activity   Alcohol use: No   Drug use: Never   Sexual activity: Not Currently    Birth control/protection: Surgical  Other Topics Concern   Not on file  Social History Narrative   Not on file   Social Determinants of Health   Financial Resource Strain: Not on file  Food Insecurity: Not on file  Transportation Needs: Not on file  Physical Activity: Not on file  Stress: Not on file  Social Connections: Not on file   Past Surgical History:  Procedure Laterality Date   ABDOMINAL HYSTERECTOMY     APPENDECTOMY     BREAST LUMPECTOMY WITH RADIOACTIVE SEED AND SENTINEL LYMPH NODE BIOPSY Right 09/10/2020   Procedure: RIGHT BREAST LUMPECTOMY WITH RADIOACTIVE SEED AND SENTINEL LYMPH NODE MAPPING;  Surgeon: Harriette Bouillon, MD;  Location: [facility];  Service: General;  Laterality: Right;   PORTACATH PLACEMENT Right 10/16/2020   Procedure: INSERTION PORT-A-CATH WITH ULTRASOUND GUIDANCE;  Surgeon: Harriette Bouillon, MD;  Location: [facility];  Service: General;  Laterality: Right;   RE-EXCISION OF BREAST LUMPECTOMY Right 10/16/2020   Procedure: RE-EXCISION OF RIGHT BREAST LUMPECTOMY;  Surgeon: Harriette Bouillon, MD;  Location: [facility];  Service: General;  Laterality: Right;   REVERSE SHOULDER ARTHROPLASTY Left 11/16/2022   Procedure: LEFT REVERSE SHOULDER ARTHROPLASTY;  Surgeon: Huel Cote, MD;  Location: [facility];  Service: Orthopedics;  Laterality: Left;   Past Medical History:  Diagnosis Date   Asthma    Back pain    Breast cancer    Buttock pain    Family history of breast cancer 09/22/2020   Hypertension    Personal history of chemotherapy    Personal history of radiation therapy     BP 91/66   Pulse 98   Ht 5' (1.524 m)   Wt 200 lb 6.4 oz (90.9 kg)   SpO2 91%   BMI 39.14 kg/m   Opioid Risk Score:   Fall Risk Score:  `1  Depression screen PHQ 2/9      No data to display            Review of Systems  Constitutional:  Positive for diaphoresis.  Respiratory:  Positive for cough and wheezing.   Musculoskeletal:        Left arm pain Left leg pain  Psychiatric/Behavioral:  Positive for dysphoric mood. The patient is nervous/anxious.   All other systems reviewed and are  negative.     Objective:   Physical Exam    PE: Constitution: Appropriate appearance for age. No apparent distress  +Obese Resp: No respiratory distress. No accessory muscle usage. on RA and CTAB Cardio: Well perfused appearance. No peripheral edema. Abdomen: Nondistended. Nontender.   Psych: Appropriate mood and affect. Neuro: AAOx4. No apparent cognitive deficits   Neurologic Exam:   DTRs: Reflexes were 2+ throughout Babinsky: flexor responses b/l.   Hoffmans: negative b/l Sensory exam: revealed normal sensation in all dermatomal regions in bilateral upper extremities Motor exam: strength 5/5 throughout bilateral upper extremities Coordination: Fine motor coordination was normal.    Neck: No reproduction of symptoms when palpated along bilateral cervical paraspinals, traps, shoulders or arms. Negative tinel's at R cubital and carpal tunnels. AROM WNL; pain with most special shoulder maneuvers.     Assessment & Plan:  Monica [name] is a 52 y.o. year old female  who  has a past medical history of Asthma, Back pain, Breast cancer, Buttock pain, Family history of breast cancer (09/22/2020), Hypertension, Personal history of chemotherapy, and Personal history of radiation therapy.   They are presenting to PM&R clinic as a new patient for treatment of L shoulder and arm  pain .   Primary osteoarthritis, left shoulder S/p shoulder replacement 12/23, without improvement  in symptoms  Take celebrex 1 capsule daily for 1 week. If no side effects, increase to 1 capsule twice daily. Do not take with nsaids like Ibuprofen. You can take it with tylenol up to 2 tablets 2-3x daily.   Follow up in 2 months to reassess   Cervical radiculopathy Continue PT Following with Dr. Alvester Morin for cervical C7-T1 injections 4/22  Anxiety state Being seen every 2 weeks by psychiatry; symptoms are improving on current regimen Would avoid opiate pain medication if possible due to worsened pain control with opiates and concurrent benzodiazepines  Other orders -     Celecoxib; Take 1 capsule (100 mg total) by mouth 2 (two) times daily. Start with one capsule in the morning for 1 week. If you tolerate for 1 week without any stomach upset, heartburn, bleeding or swelling, increase to 1 capsule twice daily.  Dispense: 60 capsule; Refill: 2     Angelina Sheriff, DO 03/24/2023

## 2023-03-24 DIAGNOSIS — M5412 Radiculopathy, cervical region: Secondary | ICD-10-CM | POA: Insufficient documentation

## 2023-03-24 DIAGNOSIS — F339 Major depressive disorder, recurrent, unspecified: Secondary | ICD-10-CM | POA: Insufficient documentation

## 2023-03-24 DIAGNOSIS — F411 Generalized anxiety disorder: Secondary | ICD-10-CM | POA: Insufficient documentation

## 2023-03-28 ENCOUNTER — Ambulatory Visit (INDEPENDENT_AMBULATORY_CARE_PROVIDER_SITE_OTHER): Payer: Medicare Other | Admitting: Physical Medicine and Rehabilitation

## 2023-03-28 ENCOUNTER — Other Ambulatory Visit: Payer: Self-pay

## 2023-03-28 VITALS — BP 126/84 | HR 91

## 2023-03-28 DIAGNOSIS — M5412 Radiculopathy, cervical region: Secondary | ICD-10-CM | POA: Diagnosis not present

## 2023-03-28 MED ORDER — BETAMETHASONE SOD PHOS & ACET 6 (3-3) MG/ML IJ SUSP: 12.0000 mg | Freq: Once | INTRAMUSCULAR | Status: DC

## 2023-03-28 NOTE — Progress Notes (Unsigned)
Functional Pain Scale - descriptive words and definitions  Distracting (5)    Aware of pain/able to complete some ADL's but limited by pain/sleep is affected and active distractions are only slightly useful. Moderate range order  Average Pain 8   +Driver, -BT, -Dye Allergies.  Neck pain on the left that radiates into the left shoulder and arm

## 2023-03-28 NOTE — Patient Instructions (Signed)

## 2023-03-29 NOTE — Procedures (Signed)
Cervical Epidural Steroid Injection - Interlaminar Approach with Fluoroscopic Guidance  Patient: Monica [name]      Date of Birth: 07-01-1971 MRN: [id number] PCP: Tylene Fantasia., PA-C      Visit Date: 03/28/2023   Universal Protocol:    Date/Time: 04/23/249:02 AM  Consent Given By: the patient  Position: PRONE  Additional Comments: Vital signs were monitored before and after the procedure. Patient was prepped and draped in the usual sterile fashion. The correct patient, procedure, and site was verified.   Injection Procedure Details:   Procedure diagnoses: Cervical radiculopathy [M54.12]    Meds Administered:  Meds ordered this encounter  Medications   betamethasone acetate-betamethasone sodium phosphate (CELESTONE) injection 12 mg     Laterality: Left  Location/Site: C7-T1  Needle: 3.5 in., 20 ga. Tuohy  Needle Placement: Paramedian epidural space  Findings:  -Comments: Excellent flow of contrast into the epidural space.  Procedure Details: Using a paramedian approach from the side mentioned above, the region overlying the inferior lamina was localized under fluoroscopic visualization and the soft tissues overlying this structure were infiltrated with 4 ml. of 1% Lidocaine without Epinephrine. A # 20 gauge, Tuohy needle was inserted into the epidural space using a paramedian approach.  The epidural space was localized using loss of resistance along with contralateral oblique bi-planar fluoroscopic views.  After negative aspirate for air, blood, and CSF, a 2 ml. volume of Isovue-250 was injected into the epidural space and the flow of contrast was observed. Radiographs were obtained for documentation purposes.   The injectate was administered into the level noted above.  Additional Comments:  No complications occurred Dressing: 2 x 2 sterile gauze and Band-Aid    Post-procedure details: Patient was observed during the procedure. Post-procedure  instructions were reviewed.  Patient left the clinic in stable condition.

## 2023-03-29 NOTE — Progress Notes (Signed)
Monica [name] - 52 y.o. female MRN [id number]  Date of birth: 15-Sep-1971  Office Visit Note: Visit Date: 03/28/2023 PCP: Tylene Fantasia., PA-C Referred by: Tylene Fantasia., PA-C  Subjective: Chief Complaint  Patient presents with   Neck - Pain   HPI:  Monica [name] is a 52 y.o. female who comes in today at the request of Dr. Huel Cote for planned Left C7-T1 Cervical Interlaminar epidural steroid injection with fluoroscopic guidance.  The patient has failed conservative care including home exercise, medications, time and activity modification.  This injection will be diagnostic and hopefully therapeutic.  Please see requesting physician notes for further details and justification.  Her pain pattern is somewhat nondermatomal but clearly left radiating pattern down the arm.  Is more of the lower cervical radicular pattern.  Dr. Steward Drone mention cervical foraminal stenosis which in her MRI is more right-sided.  We typically do not complete cervical transforaminal injections do to the inherent risk involved of those injections in the noninferiority results with interlaminar approach.  Elected to complete interlaminar epidural steroid injection today at C7-T1 which is usually where we complete cervical injections.  Patient has been very anxious today despite giving her preprocedure Valium.  She has had lumbar injections in the past at another facility.   ROS Otherwise per HPI.  Assessment & Plan: Visit Diagnoses:    ICD-10-CM   1. Cervical radiculopathy  M54.12 XR C-ARM NO REPORT    Epidural Steroid injection    betamethasone acetate-betamethasone sodium phosphate (CELESTONE) injection 12 mg      Plan: No additional findings.   Meds & Orders:  Meds ordered this encounter  Medications   betamethasone acetate-betamethasone sodium phosphate (CELESTONE) injection 12 mg    Orders Placed This Encounter  Procedures   XR C-ARM NO REPORT   Epidural Steroid  injection    Follow-up: Return for visit to requesting provider as needed.   Procedures: No procedures performed  Cervical Epidural Steroid Injection - Interlaminar Approach with Fluoroscopic Guidance  Patient: Monica [name]      Date of Birth: [birth year] MRN: [id number] PCP: Tylene Fantasia., PA-C      Visit Date: 03/28/2023   Universal Protocol:    Date/Time: 04/23/249:02 AM  Consent Given By: the patient  Position: PRONE  Additional Comments: Vital signs were monitored before and after the procedure. Patient was prepped and draped in the usual sterile fashion. The correct patient, procedure, and site was verified.   Injection Procedure Details:   Procedure diagnoses: Cervical radiculopathy [M54.12]    Meds Administered:  Meds ordered this encounter  Medications   betamethasone acetate-betamethasone sodium phosphate (CELESTONE) injection 12 mg     Laterality: Left  Location/Site: C7-T1  Needle: 3.5 in., 20 ga. Tuohy  Needle Placement: Paramedian epidural space  Findings:  -Comments: Excellent flow of contrast into the epidural space.  Procedure Details: Using a paramedian approach from the side mentioned above, the region overlying the inferior lamina was localized under fluoroscopic visualization and the soft tissues overlying this structure were infiltrated with 4 ml. of 1% Lidocaine without Epinephrine. A # 20 gauge, Tuohy needle was inserted into the epidural space using a paramedian approach.  The epidural space was localized using loss of resistance along with contralateral oblique bi-planar fluoroscopic views.  After negative aspirate for air, blood, and CSF, a 2 ml. volume of Isovue-250 was injected into the epidural space and the flow of contrast was observed. Radiographs were obtained for documentation purposes.  The injectate was administered into the level noted above.  Additional Comments:  No complications occurred Dressing: 2 x 2  sterile gauze and Band-Aid    Post-procedure details: Patient was observed during the procedure. Post-procedure instructions were reviewed.  Patient left the clinic in stable condition.   Clinical History: MRI CERVICAL SPINE WITHOUT CONTRAST   TECHNIQUE: Multiplanar, multisequence MR imaging of the cervical spine was performed. No intravenous contrast was administered.   COMPARISON:  Prior radiograph from 06/03/2022.   FINDINGS: Alignment: Straightening with mild reversal of the normal cervical lordosis. No listhesis.   Vertebrae: Vertebral body height maintained without acute or chronic fracture. Bone marrow signal intensity within normal limits. No discrete or worrisome osseous lesions. No abnormal marrow edema.   Cord: Normal signal and morphology.   Posterior Fossa, vertebral arteries, paraspinal tissues: Unremarkable.   Disc levels:   C2-C3: Minimal annular disc bulge with uncovertebral spurring. No spinal stenosis. Foramina remain patent.   C3-C4: Mild disc bulge with right worse than left uncovertebral spurring. Flattening and partial effacement of the ventral thecal sac, slightly asymmetric to the right. Mild cord flattening without cord signal changes. Mild spinal stenosis. Moderate right C4 foraminal narrowing. Left neural foramina remains patent.   C4-C5: Disc bulge with bilateral uncovertebral spurring. Flattening and effacement of the ventral thecal sac with mild cord flattening but no cord signal changes. Mild spinal stenosis. Moderate right worse than left C5 foraminal narrowing.   C5-C6: Mild disc bulge with right-sided uncovertebral spurring. Flattening and partial effacement of the ventral thecal sac with resultant mild spinal stenosis. Mild right C6 foraminal narrowing. Left neural foramina remains patent.   C6-C7: Mild disc bulge with uncovertebral spurring. Flattening and partial effacement of the ventral thecal sac, slightly asymmetric  to the left. Mild spinal stenosis. Mild left C7 foraminal narrowing. Right neural foramen remains patent.   C7-T1: Normal interspace. Mild left-sided facet hypertrophy. No spinal stenosis. Mild left C8 foraminal narrowing. Right neural foramen remains patent.   IMPRESSION: 1. Multilevel cervical spondylosis with resultant mild diffuse spinal stenosis at C3-4 through C6-7. 2. Multifactorial degenerative changes with resultant multilevel foraminal narrowing as above. Notable findings include moderate right C4 and bilateral C5 foraminal stenosis, with mild right C6 and left C7 foraminal narrowing.     Electronically Signed   By: Rise Mu M.D.   On: 02/28/2023 03:59     Objective:  VS:  HT:    WT:   BMI:     BP:126/84  HR:91bpm  TEMP: ( )  RESP:  Physical Exam Vitals and nursing note reviewed.  Constitutional:      General: She is not in acute distress.    Appearance: Normal appearance. She is not ill-appearing.  HENT:     Head: Normocephalic and atraumatic.     Right Ear: External ear normal.     Left Ear: External ear normal.  Eyes:     Extraocular Movements: Extraocular movements intact.  Cardiovascular:     Rate and Rhythm: Normal rate.     Pulses: Normal pulses.  Musculoskeletal:     Cervical back: Tenderness present. No rigidity.     Right lower leg: No edema.     Left lower leg: No edema.     Comments: Patient has good strength in the upper extremities including 5 out of 5 strength in wrist extension long finger flexion and APB.  There is no atrophy of the hands intrinsically.  There is a negative Hoffmann's test.   Lymphadenopathy:  Cervical: No cervical adenopathy.  Skin:    Findings: No erythema, lesion or rash.  Neurological:     General: No focal deficit present.     Mental Status: She is alert and oriented to person, place, and time.     Sensory: No sensory deficit.     Motor: No weakness or abnormal muscle tone.     Coordination:  Coordination normal.  Psychiatric:     Comments: Very anxious      Imaging: XR C-ARM NO REPORT  Result Date: 03/28/2023 Please see Notes tab for imaging impression.

## 2023-05-18 ENCOUNTER — Ambulatory Visit (INDEPENDENT_AMBULATORY_CARE_PROVIDER_SITE_OTHER): Payer: Medicare Other | Admitting: Orthopaedic Surgery

## 2023-05-18 ENCOUNTER — Ambulatory Visit (INDEPENDENT_AMBULATORY_CARE_PROVIDER_SITE_OTHER): Payer: Medicare Other

## 2023-05-18 DIAGNOSIS — Z96612 Presence of left artificial shoulder joint: Secondary | ICD-10-CM | POA: Diagnosis not present

## 2023-05-18 NOTE — Progress Notes (Signed)
Post Operative Evaluation    Procedure/Date of Surgery: Left reverse shoulder arthroplasty 12/12  Interval History:   Presents today for follow-up and discussion of her cervical MRI.  She is continuing to have pain that goes down the arm with numbness in the lateral aspect of the forearm.   PMH/PSH/Family History/Social History/Meds/Allergies:    Past Medical History:  Diagnosis Date   Asthma    Back pain    Breast cancer (HCC)    Buttock pain    Family history of breast cancer 09/22/2020   Hypertension    Personal history of chemotherapy    Personal history of radiation therapy    Past Surgical History:  Procedure Laterality Date   ABDOMINAL HYSTERECTOMY     APPENDECTOMY     BREAST LUMPECTOMY WITH RADIOACTIVE SEED AND SENTINEL LYMPH NODE BIOPSY Right 09/10/2020   Procedure: RIGHT BREAST LUMPECTOMY WITH RADIOACTIVE SEED AND SENTINEL LYMPH NODE MAPPING;  Surgeon: Harriette Bouillon, MD;  Location: [facility];  Service: General;  Laterality: Right;   PORTACATH PLACEMENT Right 10/16/2020   Procedure: INSERTION PORT-A-CATH WITH ULTRASOUND GUIDANCE;  Surgeon: Harriette Bouillon, MD;  Location: [facility];  Service: General;  Laterality: Right;   RE-EXCISION OF BREAST LUMPECTOMY Right 10/16/2020   Procedure: RE-EXCISION OF RIGHT BREAST LUMPECTOMY;  Surgeon: Harriette Bouillon, MD;  Location: [facility];  Service: General;  Laterality: Right;   REVERSE SHOULDER ARTHROPLASTY Left 11/16/2022   Procedure: LEFT REVERSE SHOULDER ARTHROPLASTY;  Surgeon: Huel Cote, MD;  Location: [facility];  Service: Orthopedics;  Laterality: Left;   Social History   Socioeconomic History   Marital status: Single    Spouse name: Not on file   Number of children: Not on file   Years of education: Not on file   Highest education level: Not on file  Occupational History   Not on file  Tobacco Use   Smoking  status: Former    Packs/day: 0.50    Years: 35.00    Additional pack years: 0.00    Total pack years: 17.50    Types: Cigarettes    Quit date: 08/22/2020    Years since quitting: 2.7   Smokeless tobacco: Never  Vaping Use   Vaping Use: Never used  Substance and Sexual Activity   Alcohol use: No   Drug use: Never   Sexual activity: Not Currently    Birth control/protection: Surgical  Other Topics Concern   Not on file  Social History Narrative   Not on file   Social Determinants of Health   Financial Resource Strain: Not on file  Food Insecurity: Not on file  Transportation Needs: Not on file  Physical Activity: Not on file  Stress: Not on file  Social Connections: Not on file   Family History  Problem Relation Age of Onset   Hypertension Father    Hypertension Sister    Breast cancer Maternal Aunt 64   Breast cancer Paternal Aunt        dx at unknown age   Hypertension Maternal Grandmother    Breast cancer Other        MGM's niece; dx 57s   Breast cancer Other        MGF's niece   Allergies  Allergen Reactions   Oxycodone Itching and Swelling  Current Outpatient Medications  Medication Sig Dispense Refill   ADVAIR HFA 115-21 MCG/ACT inhaler Inhale 2 puffs into the lungs 2 (two) times daily.     albuterol (VENTOLIN HFA) 108 (90 Base) MCG/ACT inhaler Inhale 2 puffs into the lungs every 4 (four) hours as needed for wheezing or shortness of breath. 8 g 3   budesonide-formoterol (SYMBICORT) 160-4.5 MCG/ACT inhaler Inhale 2 puffs into the lungs in the morning and at bedtime. 10.2 g 5   calcium citrate (CALCITRATE - DOSED IN MG ELEMENTAL CALCIUM) 950 (200 Ca) MG tablet Take 200 mg of elemental calcium by mouth daily.     celecoxib (CELEBREX) 100 MG capsule Take 1 capsule (100 mg total) by mouth 2 (two) times daily. Start with one capsule in the morning for 1 week. If you tolerate for 1 week without any stomach upset, heartburn, bleeding or swelling, increase to 1 capsule  twice daily. 60 capsule 2   Cholecalciferol (VITAMIN D3) 125 MCG (5000 UT) TABS Take 1 tablet by mouth daily.     clonazePAM (KLONOPIN) 0.5 MG tablet Take 0.5 mg by mouth daily as needed.     doxepin (SINEQUAN) 25 MG capsule Take 25 mg by mouth.     gabapentin (NEURONTIN) 300 MG capsule Take 1 capsule (300 mg total) by mouth 3 (three) times daily. 90 capsule 2   letrozole (FEMARA) 2.5 MG tablet TAKE 1 TABLET ONCE DAILY. 90 tablet 3   lisinopril (ZESTRIL) 10 MG tablet Take 1 tablet (10 mg total) by mouth daily.     prazosin (MINIPRESS) 1 MG capsule Take 1 mg by mouth at bedtime.     temazepam (RESTORIL) 15 MG capsule Take 15 mg by mouth at bedtime as needed for sleep.     Current Facility-Administered Medications  Medication Dose Route Frequency Provider Last Rate Last Admin   betamethasone acetate-betamethasone sodium phosphate (CELESTONE) injection 12 mg  12 mg Other Once Tyrell Antonio, MD       No results found.  Review of Systems:   A ROS was performed including pertinent positives and negatives as documented in the HPI.   Musculoskeletal Exam:    There were no vitals taken for this visit.  Left incision is healed.  While standing she is able to forward elevate to 130 degrees.  External rotation is to 30 degrees in the sitting position internal rotation to back pocket.  There is some numbness in the musculocutaneous distribution although she is able to fire all 3 heads of the deltoid.  Imaging:    X-ray left shoulder 3 views: Well-appearing without evidence of complication  MRI cervical spine: There is focal kyphosis at the C3-4 level with some neuroforaminal stenosis at these levels as well as he for C5  I personally reviewed and interpreted the radiographs.   Assessment:   52 year old female who is status post left reverse shoulder arthroplasty.  Overall she is doing well with regard to functional range of motion.  She does still have some numbness in the  musculocutaneous distribution.  I did discuss this could be the sequela of a possible musculocutaneous nerve palsy.  She does have an EMG nerve conduction test pending which I do believe would be helpful in ascertaining this.  Will plan to see her back in 6 months to continue and follow this clinically  Plan :    -Return to clinic 6 months for reassessment      I personally saw and evaluated the patient, and participated in the management  and treatment plan.  Huel Cote, MD Attending Physician, Orthopedic Surgery  This document was dictated using Dragon voice recognition software. A reasonable attempt at proof reading has been made to minimize errors.

## 2023-05-19 NOTE — Progress Notes (Signed)
Subjective:    Patient ID: Monica [name], female    DOB: [birth year], 52 y.o.   MRN: [id number]  HPI   Monica [name] is a 52 y.o. year old female  who  has a past medical history of Asthma, Back pain, Breast cancer (HCC), Buttock pain, Family history of breast cancer (09/22/2020), Hypertension, Personal history of chemotherapy, and Personal history of radiation therapy.   They are presenting to PM&R clinic for follow up related to L shoulder pain .  Plan from last visit:  Primary osteoarthritis, left shoulder S/p shoulder replacement 12/23, without improvement in symptoms   Take celebrex 1 capsule daily for 1 week. If no side effects, increase to 1 capsule twice daily. Do not take with nsaids like Ibuprofen. You can take it with tylenol up to 2 tablets 2-3x daily.    Follow up in 2 months to reassess    Cervical radiculopathy Continue PT Following with Dr. Alvester Morin for cervical C7-T1 injections 4/22   Anxiety state Being seen every 2 weeks by psychiatry; symptoms are improving on current regimen Would avoid opiate pain medication if possible due to worsened pain control with opiates and concurrent benzodiazepines     Interval Hx:  - Therapies: PT ended. Doing HEP 2-3x per week and thinks pain is getting worse.    - Follow ups: Saw her surgeon, did xray, looks good. Went to surgeon last week for numbness/tingling in her LUE and her surgeon agreed with EMG. Loss of sensation from mid-elbow to lateral wrist.    - Medications: Started celebrex, does not feel like it is helping. Is only taking once daily at this point. No s/e. Not taking Tylenol or ibuprofen.    - Other concerns: Pain worse with acitivty and at nighttime. Does not get muscle spasms .   Pain Inventory Average Pain 8 Pain Right Now 6 My pain is constant, sharp, and aching  In the last 24 hours, has pain interfered with the following? General activity 2 Relation with others 0 Enjoyment of  life 1 What TIME of day is your pain at its worst? morning , daytime, evening, and night Sleep (in general) Poor  Pain is worse with: inactivity Pain improves with: medication Relief from Meds: 10  Family History  Problem Relation Age of Onset   Hypertension Father    Hypertension Sister    Breast cancer Maternal Aunt 31   Breast cancer Paternal Aunt        dx at unknown age   Hypertension Maternal Grandmother    Breast cancer Other        MGM's niece; dx 65s   Breast cancer Other        MGF's niece   Social History   Socioeconomic History   Marital status: Single    Spouse name: Not on file   Number of children: Not on file   Years of education: Not on file   Highest education level: Not on file  Occupational History   Not on file  Tobacco Use   Smoking status: Former    Packs/day: 0.50    Years: 35.00    Additional pack years: 0.00    Total pack years: 17.50    Types: Cigarettes    Quit date: 08/22/2020    Years since quitting: 2.7   Smokeless tobacco: Never  Vaping Use   Vaping Use: Never used  Substance and Sexual Activity   Alcohol use: No   Drug use: Never  Sexual activity: Not Currently    Birth control/protection: Surgical  Other Topics Concern   Not on file  Social History Narrative   Not on file   Social Determinants of Health   Financial Resource Strain: Not on file  Food Insecurity: Not on file  Transportation Needs: Not on file  Physical Activity: Not on file  Stress: Not on file  Social Connections: Not on file   Past Surgical History:  Procedure Laterality Date   ABDOMINAL HYSTERECTOMY     APPENDECTOMY     BREAST LUMPECTOMY WITH RADIOACTIVE SEED AND SENTINEL LYMPH NODE BIOPSY Right 09/10/2020   Procedure: RIGHT BREAST LUMPECTOMY WITH RADIOACTIVE SEED AND SENTINEL LYMPH NODE MAPPING;  Surgeon: Harriette Bouillon, MD;  Location: [facility];  Service: General;  Laterality: Right;   PORTACATH PLACEMENT Right 10/16/2020    Procedure: INSERTION PORT-A-CATH WITH ULTRASOUND GUIDANCE;  Surgeon: Harriette Bouillon, MD;  Location: [facility];  Service: General;  Laterality: Right;   RE-EXCISION OF BREAST LUMPECTOMY Right 10/16/2020   Procedure: RE-EXCISION OF RIGHT BREAST LUMPECTOMY;  Surgeon: Harriette Bouillon, MD;  Location: [facility];  Service: General;  Laterality: Right;   REVERSE SHOULDER ARTHROPLASTY Left 11/16/2022   Procedure: LEFT REVERSE SHOULDER ARTHROPLASTY;  Surgeon: Huel Cote, MD;  Location: [facility];  Service: Orthopedics;  Laterality: Left;   Past Surgical History:  Procedure Laterality Date   ABDOMINAL HYSTERECTOMY     APPENDECTOMY     BREAST LUMPECTOMY WITH RADIOACTIVE SEED AND SENTINEL LYMPH NODE BIOPSY Right 09/10/2020   Procedure: RIGHT BREAST LUMPECTOMY WITH RADIOACTIVE SEED AND SENTINEL LYMPH NODE MAPPING;  Surgeon: Harriette Bouillon, MD;  Location: [facility];  Service: General;  Laterality: Right;   PORTACATH PLACEMENT Right 10/16/2020   Procedure: INSERTION PORT-A-CATH WITH ULTRASOUND GUIDANCE;  Surgeon: Harriette Bouillon, MD;  Location: [facility];  Service: General;  Laterality: Right;   RE-EXCISION OF BREAST LUMPECTOMY Right 10/16/2020   Procedure: RE-EXCISION OF RIGHT BREAST LUMPECTOMY;  Surgeon: Harriette Bouillon, MD;  Location: [facility];  Service: General;  Laterality: Right;   REVERSE SHOULDER ARTHROPLASTY Left 11/16/2022   Procedure: LEFT REVERSE SHOULDER ARTHROPLASTY;  Surgeon: Huel Cote, MD;  Location: [facility];  Service: Orthopedics;  Laterality: Left;   Past Medical History:  Diagnosis Date   Asthma    Back pain    Breast cancer (HCC)    Buttock pain    Family history of breast cancer 09/22/2020   Hypertension    Personal history of chemotherapy    Personal history of radiation therapy    There were no vitals taken for this visit.  Opioid Risk Score:   Fall  Risk Score:  `1  Depression screen [facility] 2/9     03/21/2023    9:53 AM  Depression screen PHQ 2/9  Decreased Interest 1  Down, Depressed, Hopeless 1  PHQ - 2 Score 2  Altered sleeping 3  Tired, decreased energy 1  Change in appetite 0  Feeling bad or failure about yourself  1  Trouble concentrating 1  Moving slowly or fidgety/restless 0  Suicidal thoughts 0  PHQ-9 Score 8  Difficult doing work/chores Somewhat difficult    Review of Systems  Musculoskeletal:        Pain in left shoulder and left forearm   All other systems reviewed and are negative.     Objective:   Physical Exam   PE: Constitution: Appropriate appearance for age. No  apparent distress   Resp: No respiratory distress. No accessory muscle usage. on RA and CTAB Cardio: Well perfused appearance. No peripheral edema. Abdomen: Nondistended. Nontender.   Psych: Appropriate mood and affect. Neuro: AAOx4. No apparent cognitive deficits   Neurologic Exam:   DTRs: Reflexes were 2+ in bilateral achilles, patella, biceps, BR and triceps. Babinsky: flexor responses b/l.   Hoffmans: negative b/l Sensory exam:   Decreased sensation to light touch lateral elbow to mid-wrist on L; not extending into fingers.   Sensation intact RUE.   Motor exam:  4.5 strength throughout LUE, not limited by pain  + apprehensoin AROM decreased in all planes L shoulder  Coordination: Fine motor coordination was normal.             Assessment & Plan:   Monica [name] is a 52 y.o. year old female  who  has a past medical history of Asthma, Back pain, Breast cancer (HCC), Buttock pain, Family history of breast cancer (09/22/2020), Hypertension, Personal history of chemotherapy, and Personal history of radiation therapy.  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  Cervical radiculopathy Start  gabapentin 300 mg capsules up to three times daily as needed  Follow up for EMG  LUE for cervical radiculopathy vs. ulnar entrapment, with myself or Dr. Wynn Banker   Follow up with me 2 weeks after study to review results  Primary osteoarthritis, left shoulder Increase Celebrex to twice daily  Other orders -     Gabapentin; Take 1 capsule (300 mg total) by mouth 3 (three) times daily as needed (numbness/burning pain). Start with 1 capsule at nighttime, and can increase up to 3 times daily  Dispense: 90 capsule; Refill: 2

## 2023-05-23 ENCOUNTER — Encounter: Payer: Self-pay | Admitting: Physical Medicine and Rehabilitation

## 2023-05-23 ENCOUNTER — Encounter
Payer: Medicare Other | Attending: Physical Medicine and Rehabilitation | Admitting: Physical Medicine and Rehabilitation

## 2023-05-23 VITALS — HR 90 | Ht 60.0 in | Wt 200.4 lb

## 2023-05-23 DIAGNOSIS — M19012 Primary osteoarthritis, left shoulder: Secondary | ICD-10-CM | POA: Diagnosis present

## 2023-05-23 DIAGNOSIS — M5412 Radiculopathy, cervical region: Secondary | ICD-10-CM

## 2023-05-23 MED ORDER — GABAPENTIN 300 MG PO CAPS: 300.0000 mg | ORAL_CAPSULE | Freq: Three times a day (TID) | ORAL | 2 refills | Status: DC | PRN

## 2023-05-23 NOTE — Patient Instructions (Signed)
Increase Celebrex to twice daily  Start gabapentin 300 mg capsules up to three times daily as needed  Follow up for EMG

## 2023-06-13 ENCOUNTER — Telehealth: Payer: Self-pay

## 2023-06-13 ENCOUNTER — Encounter (HOSPITAL_COMMUNITY): Payer: Self-pay

## 2023-06-13 ENCOUNTER — Encounter: Payer: Self-pay | Admitting: Hematology and Oncology

## 2023-06-13 ENCOUNTER — Emergency Department (HOSPITAL_COMMUNITY)
Admission: EM | Admit: 2023-06-13 | Discharge: 2023-06-13 | Discharge status: Against Medical Advice | Payer: Medicare Other | Attending: Emergency Medicine | Admitting: Emergency Medicine

## 2023-06-13 ENCOUNTER — Other Ambulatory Visit: Payer: Self-pay

## 2023-06-13 DIAGNOSIS — Z5321 Procedure and treatment not carried out due to patient leaving prior to being seen by health care provider: Secondary | ICD-10-CM | POA: Insufficient documentation

## 2023-06-13 DIAGNOSIS — N644 Mastodynia: Secondary | ICD-10-CM | POA: Diagnosis not present

## 2023-06-13 NOTE — Telephone Encounter (Signed)
Called pt to check on her and make sure she went to ED. She states she will be going to [facility] when her ride comes to get her. She states she does not drive. Pt states she is OK and has soreness/tenderness to incision site but Is fine otherwise. She will let us know if she has any further concerns.
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
## 2023-07-11 ENCOUNTER — Encounter: Payer: Medicare Other | Admitting: Physical Medicine and Rehabilitation

## 2023-07-25 ENCOUNTER — Encounter: Payer: Self-pay | Admitting: Physical Medicine and Rehabilitation

## 2023-07-25 ENCOUNTER — Encounter
Payer: Medicare Other | Attending: Physical Medicine and Rehabilitation | Admitting: Physical Medicine and Rehabilitation

## 2023-07-25 VITALS — BP 106/75 | HR 83 | Ht 65.0 in | Wt 195.0 lb

## 2023-07-25 DIAGNOSIS — M19012 Primary osteoarthritis, left shoulder: Secondary | ICD-10-CM | POA: Diagnosis present

## 2023-07-25 DIAGNOSIS — G479 Sleep disorder, unspecified: Secondary | ICD-10-CM | POA: Insufficient documentation

## 2023-07-25 DIAGNOSIS — M5412 Radiculopathy, cervical region: Secondary | ICD-10-CM | POA: Insufficient documentation
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
## 2023-07-25 NOTE — Patient Instructions (Addendum)
Follow up with me 2 weeks after EMG  Continue bracing and current medications  Send me results of bloodwork with your PCP in September  I will fill out [facility] paperwork and call in 2-3 days when it is ready  For sleep, I want you to pick a time to lay down every night, ideally between 8 and 10 PM.    Starting 1 hour before you want to go to sleep, turn off all television screens, phone screens, tablets, and computers.    Keep the lights low and perform only low stimulation activities, such as reading.    Only use your bedroom for sleep and sex.    You may also take 3 to 5 mg of over-the-counter melatonin approximately 1 hour before bedtime.

## 2023-07-31 DIAGNOSIS — G479 Sleep disorder, unspecified: Secondary | ICD-10-CM | POA: Insufficient documentation

## 2023-08-02 ENCOUNTER — Encounter: Payer: Self-pay | Admitting: *Deleted

## 2023-08-02 NOTE — Telephone Encounter (Signed)
error 

## 2023-08-10 ENCOUNTER — Other Ambulatory Visit: Payer: Self-pay | Admitting: Hematology and Oncology

## 2023-08-10 DIAGNOSIS — Z9889 Other specified postprocedural states: Secondary | ICD-10-CM

## 2023-08-23 ENCOUNTER — Encounter: Payer: Self-pay | Admitting: Hematology and Oncology

## 2023-08-31 ENCOUNTER — Ambulatory Visit (INDEPENDENT_AMBULATORY_CARE_PROVIDER_SITE_OTHER): Payer: Medicare Other | Admitting: Family Medicine

## 2023-08-31 ENCOUNTER — Encounter: Payer: Self-pay | Admitting: Family Medicine

## 2023-08-31 VITALS — BP 134/85 | HR 87 | Ht 65.0 in | Wt 192.1 lb

## 2023-08-31 DIAGNOSIS — E7849 Other hyperlipidemia: Secondary | ICD-10-CM

## 2023-08-31 DIAGNOSIS — F339 Major depressive disorder, recurrent, unspecified: Secondary | ICD-10-CM

## 2023-08-31 DIAGNOSIS — Z1211 Encounter for screening for malignant neoplasm of colon: Secondary | ICD-10-CM

## 2023-08-31 DIAGNOSIS — J4540 Moderate persistent asthma, uncomplicated: Secondary | ICD-10-CM

## 2023-08-31 DIAGNOSIS — I10 Essential (primary) hypertension: Secondary | ICD-10-CM | POA: Diagnosis not present

## 2023-08-31 DIAGNOSIS — E559 Vitamin D deficiency, unspecified: Secondary | ICD-10-CM

## 2023-08-31 DIAGNOSIS — Z23 Encounter for immunization: Secondary | ICD-10-CM

## 2023-08-31 DIAGNOSIS — R7301 Impaired fasting glucose: Secondary | ICD-10-CM

## 2023-08-31 DIAGNOSIS — Z1159 Encounter for screening for other viral diseases: Secondary | ICD-10-CM

## 2023-08-31 DIAGNOSIS — Z114 Encounter for screening for human immunodeficiency virus [HIV]: Secondary | ICD-10-CM

## 2023-08-31 DIAGNOSIS — E038 Other specified hypothyroidism: Secondary | ICD-10-CM

## 2023-08-31 MED ORDER — ALBUTEROL SULFATE (2.5 MG/3ML) 0.083% IN NEBU
2.5000 mg | INHALATION_SOLUTION | Freq: Four times a day (QID) | RESPIRATORY_TRACT | 1 refills | Status: DC | PRN
Start: 2023-08-31 — End: 2024-08-12

## 2023-08-31 NOTE — Assessment & Plan Note (Signed)
Refill sent to the pharmacy Encouraged to continue to follow-up with pulmonology

## 2023-08-31 NOTE — Patient Instructions (Addendum)
I appreciate the opportunity to provide care to you today!    Follow up:  2 weeks for pap and 1 month for BP   Labs: please stop by the lab today to get your blood drawn (CBC, CMP, TSH, Lipid profile, HgA1c, Vit D)  Screening: HIV and Hep C   Nonpharmacologic management of depression  Mindfulness and Meditation Practices like mindfulness meditation can help reduce symptoms by promoting relaxation and present-moment awareness.  Exercise  Regular physical activity has been shown to improve mood and reduce anxiety through the release of endorphins and other neurochemicals.  Healthy Diet Eating a balanced diet rich in fruits, vegetables, whole grains, and lean proteins can support overall mental health.  Sleep Hygiene  Establishing a regular sleep routine and ensuring good sleep quality can significantly impact mood and anxiety levels.  Stress Management Techniques Activities such as yoga, tai chi, and deep breathing exercises can help manage stress.  Social Support Maintaining strong relationships and seeking support from friends, family, or support groups can provide emotional comfort and reduce feelings of isolation.  Lifestyle Modifications Reducing alcohol and caffeine intake, quitting smoking, and avoiding recreational drugs can improve symptoms.  Art and Music Therapy Engaging in creative activities like painting, drawing, or playing music can be therapeutic and help express emotions.  Light Therapy Particularly useful for seasonal affective disorder (SAD), exposure to bright light can help regulate mood. .   Please stop by your local pharmacy and get your Tdap and Shingles vaccine  Referrals today- GI for colon cancer screening     Please continue to a heart-healthy diet and increase your physical activities. Try to exercise for at least five days a week.    It was a pleasure to see you and I look forward to continuing to work together on your health and  well-being. Please do not hesitate to call the office if you need care or have questions about your care.  In case of emergency, please visit the Emergency Department for urgent care, or contact our clinic at [phone] to schedule an appointment. We're here to help you!   Have a wonderful day and week. With Gratitude, Gilmore Laroche MSN, FNP-BC

## 2023-08-31 NOTE — Assessment & Plan Note (Signed)
Encouraged to continue her current treatment regimen and follow-up with her counselor and psychiatrist as scheduled No reports of suicidal thoughts ideation Encourage nonpharmacologic management of depression  Mindfulness and Meditation Practices like mindfulness meditation can help reduce symptoms by promoting relaxation and present-moment awareness.  Exercise  Regular physical activity has been shown to improve mood and reduce anxiety through the release of endorphins and other neurochemicals.  Healthy Diet Eating a balanced diet rich in fruits, vegetables, whole grains, and lean proteins can support overall mental health.  Sleep Hygiene  Establishing a regular sleep routine and ensuring good sleep quality can significantly impact mood and anxiety levels.  Stress Management Techniques Activities such as yoga, tai chi, and deep breathing exercises can help manage stress.  Social Support Maintaining strong relationships and seeking support from friends, family, or support groups can provide emotional comfort and reduce feelings of isolation.  Lifestyle Modifications Reducing alcohol and caffeine intake, quitting smoking, and avoiding recreational drugs can improve symptoms.  Art and Music Therapy Engaging in creative activities like painting, drawing, or playing music can be therapeutic and help express emotions.  Light Therapy Particularly useful for seasonal affective disorder (SAD), exposure to bright light can help regulate mood. Marland Kitchen

## 2023-08-31 NOTE — Assessment & Plan Note (Signed)
The patient's hypertension is currently controlled. She is encouraged to take lisinopril 10 mg daily and discontinue prazosin 1 mg daily. A follow-up appointment is scheduled for one month. The patient is advised to check her blood pressure daily while on lisinopril and to adhere to a low-sodium diet with increased physical activity. BP Readings from Last 3 Encounters:  08/31/23 134/85  07/25/23 106/75  06/13/23 (!) 128/103

## 2023-08-31 NOTE — Progress Notes (Signed)
New Patient Office Visit  Subjective:  Patient ID: Monica [name], female    DOB: 11/10/1971  Age: 52 y.o. MRN: [id number]  CC:  Chief Complaint  Patient presents with   New Patient (Initial Visit)    Establish care catch up on health maintenance     HPI Monica [name] is a 52 y.o. female with past medical history of right breast cancer s/p lumpectomy in 2021, hypertension, depression and asthma presents for establishing care.  Moderate persistent asthma: The patient is under the care of pulmonary for moderate persistent asthma and is requesting a refill of her nebulizer solution today  Hypertension: she is currently taking lisinopril 10 mg daily and prazosin 1 mg daily. She expresses a desire to discontinue prednisone and reports being asymptomatic, adhering to a low sodium diet, and increasing her physical activity  Depression: The patient is receiving care from a licensed therapist at [facility] in [place] and a psychiatrist at [facility] in [place]. She is currently on Lexapro 10 mg daily and takes temazepam 15 mg at bedtime to assist with sleep. Additionally, she has a prescription for clonazepam 0.25 mg to take as needed for procedure-related anxiety.  Additionally, she is scheduled for a repeat diagnostic mammogram in October 2024.  Past Medical History:  Diagnosis Date   Asthma    Back pain    Breast cancer (HCC)    Buttock pain    Family history of breast cancer 09/22/2020   Hypertension    Personal history of chemotherapy    Personal history of radiation therapy     Past Surgical History:  Procedure Laterality Date   ABDOMINAL HYSTERECTOMY     APPENDECTOMY     BREAST LUMPECTOMY WITH RADIOACTIVE SEED AND SENTINEL LYMPH NODE BIOPSY Right 09/10/2020   Procedure: RIGHT BREAST LUMPECTOMY WITH RADIOACTIVE SEED AND SENTINEL LYMPH NODE MAPPING;  Surgeon: Harriette Bouillon, MD;  Location: [facility];  Service: General;   Laterality: Right;   PORTACATH PLACEMENT Right 10/16/2020   Procedure: INSERTION PORT-A-CATH WITH ULTRASOUND GUIDANCE;  Surgeon: Harriette Bouillon, MD;  Location: [facility];  Service: General;  Laterality: Right;   RE-EXCISION OF BREAST LUMPECTOMY Right 10/16/2020   Procedure: RE-EXCISION OF RIGHT BREAST LUMPECTOMY;  Surgeon: Harriette Bouillon, MD;  Location: [facility];  Service: General;  Laterality: Right;   REVERSE SHOULDER ARTHROPLASTY Left 11/16/2022   Procedure: LEFT REVERSE SHOULDER ARTHROPLASTY;  Surgeon: Huel Cote, MD;  Location: [facility];  Service: Orthopedics;  Laterality: Left;    Family History  Problem Relation Age of Onset   Hypertension Father    Hypertension Sister    Hypertension Maternal Grandmother    Breast cancer Maternal Aunt 19   Breast cancer Paternal Aunt        dx at unknown age   Breast cancer Other        MGM's niece; dx 87s   Breast cancer Other        MGF's niece    Social History   Socioeconomic History   Marital status: Single    Spouse name: Not on file   Number of children: Not on file   Years of education: Not on file   Highest education level: Not on file  Occupational History   Not on file  Tobacco Use   Smoking status: Former    Current packs/day: 0.00    Average packs/day: 0.5 packs/day for 35.0 years (17.5 ttl pk-yrs)  Types: Cigarettes    Start date: 08/22/1985    Quit date: 08/22/2020    Years since quitting: 3.0   Smokeless tobacco: Never  Vaping Use   Vaping status: Never Used  Substance and Sexual Activity   Alcohol use: No   Drug use: Never   Sexual activity: Not Currently    Birth control/protection: Surgical  Other Topics Concern   Not on file  Social History Narrative   Not on file   Social Determinants of Health   Financial Resource Strain: Not on file  Food Insecurity: Not on file  Transportation Needs: Not on file  Physical Activity: Not on file  Stress:  Not on file  Social Connections: Not on file  Intimate Partner Violence: Not on file    ROS Review of Systems  Constitutional:  Negative for chills and fever.  Eyes:  Negative for visual disturbance.  Respiratory:  Negative for chest tightness and shortness of breath.   Neurological:  Negative for dizziness and headaches.    Objective:   Today's Vitals: BP 134/85 (BP Location: Left Arm, Patient Position: Sitting, Cuff Size: Large)   Pulse 87   Ht 5\' 5"  (1.651 m)   Wt 192 lb 1.3 oz (87.1 kg)   SpO2 94%   BMI 31.96 kg/m   Physical Exam HENT:     Head: Normocephalic.     Mouth/Throat:     Mouth: Mucous membranes are moist.  Cardiovascular:     Rate and Rhythm: Normal rate.     Heart sounds: Normal heart sounds.  Pulmonary:     Effort: Pulmonary effort is normal.     Breath sounds: Normal breath sounds.  Neurological:     Mental Status: She is alert.      Assessment & Plan:   Primary hypertension Assessment & Plan: The patient's hypertension is currently controlled. She is encouraged to take lisinopril 10 mg daily and discontinue prazosin 1 mg daily. A follow-up appointment is scheduled for one month. The patient is advised to check her blood pressure daily while on lisinopril and to adhere to a low-sodium diet with increased physical activity. BP Readings from Last 3 Encounters:  08/31/23 134/85  07/25/23 106/75  06/13/23 (!) 128/103       Moderate persistent asthma without complication Assessment & Plan: Refill sent to the pharmacy Encouraged to continue to follow-up with pulmonology  Orders: -     Albuterol Sulfate; Take 3 mLs (2.5 mg total) by nebulization every 6 (six) hours as needed for wheezing or shortness of breath.  Dispense: 75 mL; Refill: 1  Depression, recurrent (HCC) Assessment & Plan: Encouraged to continue her current treatment regimen and follow-up with her counselor and psychiatrist as scheduled No reports of suicidal thoughts  ideation Encourage nonpharmacologic management of depression  Mindfulness and Meditation Practices like mindfulness meditation can help reduce symptoms by promoting relaxation and present-moment awareness.  Exercise  Regular physical activity has been shown to improve mood and reduce anxiety through the release of endorphins and other neurochemicals.  Healthy Diet Eating a balanced diet rich in fruits, vegetables, whole grains, and lean proteins can support overall mental health.  Sleep Hygiene  Establishing a regular sleep routine and ensuring good sleep quality can significantly impact mood and anxiety levels.  Stress Management Techniques Activities such as yoga, tai chi, and deep breathing exercises can help manage stress.  Social Support Maintaining strong relationships and seeking support from friends, family, or support groups can provide emotional comfort and reduce  feelings of isolation.  Lifestyle Modifications Reducing alcohol and caffeine intake, quitting smoking, and avoiding recreational drugs can improve symptoms.  Art and Music Therapy Engaging in creative activities like painting, drawing, or playing music can be therapeutic and help express emotions.  Light Therapy Particularly useful for seasonal affective disorder (SAD), exposure to bright light can help regulate mood. .    Colon cancer screening -     Ambulatory referral to Gastroenterology  IFG (impaired fasting glucose) -     Hemoglobin A1c  Vitamin D deficiency -     VITAMIN D 25 Hydroxy (Vit-D Deficiency, Fractures)  Need for hepatitis C screening test -     Hepatitis C antibody  Encounter for screening for HIV -     HIV Antibody (routine testing w rflx)  Other specified hypothyroidism -     TSH + free T4  Other hyperlipidemia -     Lipid panel -     CMP14+EGFR -     CBC with Differential/Platelet  Encounter for immunization -     Flu vaccine trivalent PF, 6mos and  older(Flulaval,Afluria,Fluarix,Fluzone)    Note: This chart has been completed using [employer] software, and while attempts have been made to ensure accuracy, certain words and phrases may not be transcribed as intended.   Follow-up: Return in about 2 weeks (around 09/14/2023) for pap smear.   Gilmore Laroche, FNP

## 2023-09-01 ENCOUNTER — Telehealth: Payer: Self-pay | Admitting: Family Medicine

## 2023-09-01 ENCOUNTER — Other Ambulatory Visit: Payer: Self-pay | Admitting: Family Medicine

## 2023-09-01 DIAGNOSIS — J4540 Moderate persistent asthma, uncomplicated: Secondary | ICD-10-CM

## 2023-09-01 LAB — CMP14+EGFR
ALT: 25 IU/L (ref 0–32)
AST: 33 IU/L (ref 0–40)
Albumin: 4.3 g/dL (ref 3.8–4.9)
Alkaline Phosphatase: 141 IU/L — ABNORMAL HIGH (ref 44–121)
BUN/Creatinine Ratio: 8 — ABNORMAL LOW (ref 9–23)
BUN: 7 mg/dL (ref 6–24)
Bilirubin Total: 0.5 mg/dL (ref 0.0–1.2)
CO2: 22 mmol/L (ref 20–29)
Calcium: 9.8 mg/dL (ref 8.7–10.2)
Chloride: 106 mmol/L (ref 96–106)
Creatinine, Ser: 0.85 mg/dL (ref 0.57–1.00)
Globulin, Total: 2.7 g/dL (ref 1.5–4.5)
Glucose: 87 mg/dL (ref 70–99)
Potassium: 4.1 mmol/L (ref 3.5–5.2)
Sodium: 142 mmol/L (ref 134–144)
Total Protein: 7 g/dL (ref 6.0–8.5)
eGFR: 82 mL/min/{1.73_m2} (ref >59)

## 2023-09-01 LAB — CBC WITH DIFFERENTIAL/PLATELET
Basophils Absolute: 0 10*3/uL (ref 0.0–0.2)
Basos: 0 %
EOS (ABSOLUTE): 0.1 10*3/uL (ref 0.0–0.4)
Eos: 1 %
Hematocrit: 41.6 % (ref 34.0–46.6)
Hemoglobin: 13.2 g/dL (ref 11.1–15.9)
Immature Grans (Abs): 0 10*3/uL (ref 0.0–0.1)
Immature Granulocytes: 0 %
Lymphocytes Absolute: 1.8 10*3/uL (ref 0.7–3.1)
Lymphs: 26 %
MCH: 27.1 pg (ref 26.6–33.0)
MCHC: 31.7 g/dL (ref 31.5–35.7)
MCV: 85 fL (ref 79–97)
Monocytes Absolute: 0.5 10*3/uL (ref 0.1–0.9)
Monocytes: 7 %
Neutrophils Absolute: 4.6 10*3/uL (ref 1.4–7.0)
Neutrophils: 66 %
Platelets: 228 10*3/uL (ref 150–450)
RBC: 4.87 x10E6/uL (ref 3.77–5.28)
RDW: 14.3 % (ref 11.7–15.4)
WBC: 7 10*3/uL (ref 3.4–10.8)

## 2023-09-01 LAB — VITAMIN D 25 HYDROXY (VIT D DEFICIENCY, FRACTURES): Vit D, 25-Hydroxy: 49.2 ng/mL (ref 30.0–100.0)

## 2023-09-01 LAB — LIPID PANEL
Chol/HDL Ratio: 3.2 ratio (ref 0.0–4.4)
Cholesterol, Total: 166 mg/dL (ref 100–199)
HDL: 52 mg/dL (ref >39)
LDL Chol Calc (NIH): 98 mg/dL (ref 0–99)
Triglycerides: 83 mg/dL (ref 0–149)
VLDL Cholesterol Cal: 16 mg/dL (ref 5–40)

## 2023-09-01 LAB — HEPATITIS C ANTIBODY: Hep C Virus Ab: NONREACTIVE

## 2023-09-01 LAB — HEMOGLOBIN A1C
Est. average glucose Bld gHb Est-mCnc: 111 mg/dL
Hgb A1c MFr Bld: 5.5 % (ref 4.8–5.6)

## 2023-09-01 LAB — TSH+FREE T4
Free T4: 1.12 ng/dL (ref 0.82–1.77)
TSH: 1.56 u[IU]/mL (ref 0.450–4.500)

## 2023-09-01 LAB — HIV ANTIBODY (ROUTINE TESTING W REFLEX): HIV Screen 4th Generation wRfx: NONREACTIVE

## 2023-09-01 NOTE — Telephone Encounter (Signed)
Please provide the pt with goodrx coupon card to use

## 2023-09-01 NOTE — Progress Notes (Signed)
Please inform the patient that her labs are stable

## 2023-09-01 NOTE — Telephone Encounter (Signed)
Patient called insurance will not pay needs a prior authorization  albuterol (PROVENTIL) (2.5 MG/3ML) 0.083% nebulizer solution [[id number]]   Pharmacy: [facility]

## 2023-09-02 NOTE — Telephone Encounter (Signed)
Pt was able to pick up the rx.

## 2023-09-05 ENCOUNTER — Encounter
Payer: Medicare Other | Attending: Physical Medicine and Rehabilitation | Admitting: Physical Medicine and Rehabilitation

## 2023-09-05 ENCOUNTER — Encounter: Payer: Self-pay | Admitting: Physical Medicine and Rehabilitation

## 2023-09-05 VITALS — BP 111/71 | HR 73 | Ht 65.0 in | Wt 191.6 lb

## 2023-09-05 DIAGNOSIS — M19012 Primary osteoarthritis, left shoulder: Secondary | ICD-10-CM | POA: Diagnosis present

## 2023-09-05 DIAGNOSIS — G479 Sleep disorder, unspecified: Secondary | ICD-10-CM | POA: Insufficient documentation

## 2023-09-05 DIAGNOSIS — M5412 Radiculopathy, cervical region: Secondary | ICD-10-CM | POA: Diagnosis present

## 2023-09-05 NOTE — Progress Notes (Signed)
  Monica [name] is a 52 y.o. year old female  who  has a past medical history of Asthma, Back pain, Breast cancer (HCC), Buttock pain, Family history of breast cancer (09/22/2020), Hypertension, Personal history of chemotherapy, and Personal history of radiation therapy.   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  Marland Kitchen  Plan from last visit:  Cervical radiculopathy Follow up with me 2 weeks after EMG   Continue bracing and gabapentin   I will fill out [facility] paperwork - finished 8/23   Primary osteoarthritis, left shoulder Less bothersome today than radicular symptoms as above   Discussed injections, defer at this time   Doing well on celebrex; Send me results of bloodwork with your PCP in September   Difficulty sleeping Chronic, uncertain etiology, impacted by life stressors, has trialled multiple medications   For sleep, I want you to pick a time to lay down every night, ideally between 8 and 10 PM.     Starting 1 hour before you want to go to sleep, turn off all television screens, phone screens, tablets, and computers.     Keep the lights low and perform only low stimulation activities, such as reading.     Only use your bedroom for sleep and sex.     You may also take 3 to 5 mg of over-the-counter melatonin approximately 1 hour before bedtime   Interval Hx: No changes since last visit. Presents today for EMG LUE. Give 2-3 days for formal read and upload into Media tab of chart.

## 2023-09-06 ENCOUNTER — Encounter: Payer: Self-pay | Admitting: *Deleted

## 2023-09-15 ENCOUNTER — Other Ambulatory Visit (HOSPITAL_COMMUNITY)
Admission: RE | Admit: 2023-09-15 | Discharge: 2023-09-15 | Disposition: A | Discharge status: Home / Self Care | Payer: Medicare Other | Source: Ambulatory Visit | Attending: Family Medicine | Admitting: Family Medicine

## 2023-09-15 ENCOUNTER — Ambulatory Visit (INDEPENDENT_AMBULATORY_CARE_PROVIDER_SITE_OTHER): Payer: Medicare Other | Admitting: Family Medicine

## 2023-09-15 ENCOUNTER — Encounter: Payer: Self-pay | Admitting: Family Medicine

## 2023-09-15 VITALS — BP 106/73 | HR 92 | Ht 65.0 in | Wt 194.0 lb

## 2023-09-15 DIAGNOSIS — Z1151 Encounter for screening for human papillomavirus (HPV): Secondary | ICD-10-CM | POA: Diagnosis not present

## 2023-09-15 DIAGNOSIS — Z01419 Encounter for gynecological examination (general) (routine) without abnormal findings: Secondary | ICD-10-CM | POA: Diagnosis present

## 2023-09-15 DIAGNOSIS — Z124 Encounter for screening for malignant neoplasm of cervix: Secondary | ICD-10-CM | POA: Insufficient documentation

## 2023-09-15 NOTE — Patient Instructions (Addendum)
I appreciate the opportunity to provide care to you today!    Attached with your AVS, you will find valuable resources for self-education. I highly recommend dedicating some time to thoroughly examine them.   Please continue to a heart-healthy diet and increase your physical activities. Try to exercise for at least five days a week.    It was a pleasure to see you and I look forward to continuing to work together on your health and well-being. Please do not hesitate to call the office if you need care or have questions about your care.  In case of emergency, please visit the Emergency Department for urgent care, or contact our clinic at [phone] to schedule an appointment. We're here to help you!   Have a wonderful day and week. With Gratitude, Gilmore Laroche MSN, FNP-BC

## 2023-09-15 NOTE — Progress Notes (Signed)
Established Patient Office Visit  Subjective:  Patient ID: Monica [name], female    DOB: 1971/11/10  Age: 52 y.o. MRN: [id number]  CC:  Chief Complaint  Patient presents with   Gynecologic Exam    pap    HPI Monica [name] is a 52 y.o. female  presents for for a Pap smear examination.  Past Medical History:  Diagnosis Date   Asthma    Back pain    Breast cancer (HCC)    Buttock pain    Family history of breast cancer 09/22/2020   Hypertension    Personal history of chemotherapy    Personal history of radiation therapy     Past Surgical History:  Procedure Laterality Date   ABDOMINAL HYSTERECTOMY     APPENDECTOMY     BREAST LUMPECTOMY WITH RADIOACTIVE SEED AND SENTINEL LYMPH NODE BIOPSY Right 09/10/2020   Procedure: RIGHT BREAST LUMPECTOMY WITH RADIOACTIVE SEED AND SENTINEL LYMPH NODE MAPPING;  Surgeon: Harriette Bouillon, MD;  Location: [facility];  Service: General;  Laterality: Right;   PORTACATH PLACEMENT Right 10/16/2020   Procedure: INSERTION PORT-A-CATH WITH ULTRASOUND GUIDANCE;  Surgeon: Harriette Bouillon, MD;  Location: [facility];  Service: General;  Laterality: Right;   RE-EXCISION OF BREAST LUMPECTOMY Right 10/16/2020   Procedure: RE-EXCISION OF RIGHT BREAST LUMPECTOMY;  Surgeon: Harriette Bouillon, MD;  Location: [facility];  Service: General;  Laterality: Right;   REVERSE SHOULDER ARTHROPLASTY Left 11/16/2022   Procedure: LEFT REVERSE SHOULDER ARTHROPLASTY;  Surgeon: Huel Cote, MD;  Location: [facility];  Service: Orthopedics;  Laterality: Left;    Family History  Problem Relation Age of Onset   Hypertension Father    Hypertension Sister    Hypertension Maternal Grandmother    Breast cancer Maternal Aunt 66   Breast cancer Paternal Aunt        dx at unknown age   Breast cancer Other        MGM's niece; dx 38s   Breast cancer Other        MGF's niece    Social History    Socioeconomic History   Marital status: Single    Spouse name: Not on file   Number of children: Not on file   Years of education: Not on file   Highest education level: Not on file  Occupational History   Not on file  Tobacco Use   Smoking status: Former    Current packs/day: 0.00    Average packs/day: 0.5 packs/day for 35.0 years (17.5 ttl pk-yrs)    Types: Cigarettes    Start date: 08/22/1985    Quit date: 08/22/2020    Years since quitting: 3.0   Smokeless tobacco: Never  Vaping Use   Vaping status: Never Used  Substance and Sexual Activity   Alcohol use: No   Drug use: Never   Sexual activity: Not Currently    Birth control/protection: Surgical  Other Topics Concern   Not on file  Social History Narrative   Not on file   Social Determinants of Health   Financial Resource Strain: Not on file  Food Insecurity: Not on file  Transportation Needs: Not on file  Physical Activity: Not on file  Stress: Not on file  Social Connections: Not on file  Intimate Partner Violence: Not on file    Outpatient Medications Prior to Visit  Medication Sig Dispense Refill   ADVAIR [facility] 115-21 MCG/ACT inhaler Inhale 2 puffs into the lungs 2 (  two) times daily.     albuterol (PROVENTIL) (2.5 MG/3ML) 0.083% nebulizer solution Take 3 mLs (2.5 mg total) by nebulization every 6 (six) hours as needed for wheezing or shortness of breath. 75 mL 1   albuterol (VENTOLIN HFA) 108 (90 Base) MCG/ACT inhaler Inhale 2 puffs into the lungs every 4 (four) hours as needed for wheezing or shortness of breath. 8 g 3   calcium citrate (CALCITRATE - DOSED IN MG ELEMENTAL CALCIUM) 950 (200 Ca) MG tablet Take 200 mg of elemental calcium by mouth daily.     clonazePAM (KLONOPIN) 0.5 MG tablet Take 0.5 mg by mouth daily as needed.     escitalopram (LEXAPRO) 20 MG tablet Take by mouth.     gabapentin (NEURONTIN) 300 MG capsule Take 1 capsule (300 mg total) by mouth 3 (three) times daily as needed (numbness/burning  pain). Start with 1 capsule at nighttime, and can increase up to 3 times daily 90 capsule 2   letrozole (FEMARA) 2.5 MG tablet TAKE 1 TABLET ONCE DAILY. 90 tablet 3   lisinopril (ZESTRIL) 10 MG tablet Take 1 tablet (10 mg total) by mouth daily.     prazosin (MINIPRESS) 1 MG capsule Take 1 mg by mouth at bedtime.     temazepam (RESTORIL) 15 MG capsule Take 15 mg by mouth at bedtime as needed for sleep.     Facility-Administered Medications Prior to Visit  Medication Dose Route Frequency Provider Last Rate Last Admin   betamethasone acetate-betamethasone sodium phosphate (CELESTONE) injection 12 mg  12 mg Other Once Tyrell Antonio, MD        Allergies  Allergen Reactions   Oxycodone Itching and Swelling    ROS Review of Systems  Constitutional:  Negative for chills and fever.  Eyes:  Negative for visual disturbance.  Respiratory:  Negative for chest tightness and shortness of breath.   Genitourinary:  Negative for decreased urine volume, urgency, vaginal bleeding, vaginal discharge and vaginal pain.  Neurological:  Negative for dizziness and headaches.      Objective:    Physical Exam HENT:     Head: Normocephalic.     Mouth/Throat:     Mouth: Mucous membranes are moist.  Cardiovascular:     Rate and Rhythm: Normal rate.     Heart sounds: Normal heart sounds.  Pulmonary:     Effort: Pulmonary effort is normal.     Breath sounds: Normal breath sounds.  Genitourinary:    Exam position: Lithotomy position.     Tanner stage (genital): 5.     Labia:        Right: No tenderness or lesion.        Left: No tenderness or lesion.      Urethra: No urethral swelling or urethral lesion.     Comments: Vaginal wall: pink and rugated, smooth and non-tender; absence of lesions, edema, and erythema. Labia Majora and Minora: present bilaterally, moist, soft tissue, and homogeneous; free of edema and ulcerations. Clitoris is anatomically present, above the urethral, and free of lesions,  masses, and ulceration.    Neurological:     Mental Status: She is alert.     BP 106/73   Pulse 92   Ht 5\' 5"  (1.651 m)   Wt 194 lb (88 kg)   SpO2 95%   BMI 32.28 kg/m  Wt Readings from Last 3 Encounters:  09/15/23 194 lb (88 kg)  09/05/23 191 lb 9.6 oz (86.9 kg)  08/31/23 192 lb 1.3 oz (87.1 kg)  Lab Results  Component Value Date   TSH 1.560 08/31/2023   Lab Results  Component Value Date   WBC 7.0 08/31/2023   HGB 13.2 08/31/2023   HCT 41.6 08/31/2023   MCV 85 08/31/2023   PLT 228 08/31/2023   Lab Results  Component Value Date   NA 142 08/31/2023   K 4.1 08/31/2023   CO2 22 08/31/2023   GLUCOSE 87 08/31/2023   BUN 7 08/31/2023   CREATININE 0.85 08/31/2023   BILITOT 0.5 08/31/2023   ALKPHOS 141 (H) 08/31/2023   AST 33 08/31/2023   ALT 25 08/31/2023   PROT 7.0 08/31/2023   ALBUMIN 4.3 08/31/2023   CALCIUM 9.8 08/31/2023   ANIONGAP 9 07/31/2022   EGFR 82 08/31/2023   Lab Results  Component Value Date   CHOL 166 08/31/2023   Lab Results  Component Value Date   HDL 52 08/31/2023   Lab Results  Component Value Date   LDLCALC 98 08/31/2023   Lab Results  Component Value Date   TRIG 83 08/31/2023   Lab Results  Component Value Date   CHOLHDL 3.2 08/31/2023   Lab Results  Component Value Date   HGBA1C 5.5 08/31/2023      Assessment & Plan:  Cervical cancer screening Assessment & Plan: -Cytology and HPV co-testing (preferred) every 5 years or cytology alone (acceptable) every 3 years. - Pap due 2029 if current exam is negative and within the desired findings    Orders: -     Cytology - PAP  Note: This chart has been completed using Engineer, civil (consulting) software, and while attempts have been made to ensure accuracy, certain words and phrases may not be transcribed as intended.    Follow-up: Return in about 1 month (around 10/16/2023).   Gilmore Laroche, FNP

## 2023-09-15 NOTE — Assessment & Plan Note (Signed)
-  Cytology and HPV co-testing (preferred) every 5 years or cytology alone (acceptable) every 3 years. - Pap due 2029 if current exam is negative and within the desired findings

## 2023-09-21 ENCOUNTER — Encounter: Payer: Self-pay | Admitting: *Deleted

## 2023-09-21 ENCOUNTER — Encounter: Payer: Self-pay | Admitting: Internal Medicine

## 2023-09-21 ENCOUNTER — Ambulatory Visit: Payer: Medicare Other | Admitting: Physical Medicine and Rehabilitation

## 2023-09-21 ENCOUNTER — Ambulatory Visit (INDEPENDENT_AMBULATORY_CARE_PROVIDER_SITE_OTHER): Payer: Medicare Other | Admitting: Internal Medicine

## 2023-09-21 ENCOUNTER — Other Ambulatory Visit: Payer: Self-pay | Admitting: *Deleted

## 2023-09-21 VITALS — BP 130/77 | HR 77 | Temp 98.6°F | Ht 66.0 in | Wt 194.6 lb

## 2023-09-21 DIAGNOSIS — K5909 Other constipation: Secondary | ICD-10-CM | POA: Diagnosis not present

## 2023-09-21 DIAGNOSIS — K5904 Chronic idiopathic constipation: Secondary | ICD-10-CM

## 2023-09-21 DIAGNOSIS — Z1211 Encounter for screening for malignant neoplasm of colon: Secondary | ICD-10-CM

## 2023-09-21 LAB — CYTOLOGY - PAP
Chlamydia: NEGATIVE
Comment: NEGATIVE
Comment: NEGATIVE
Comment: NEGATIVE
Comment: NEGATIVE
Comment: NORMAL
Diagnosis: NEGATIVE
HSV1: NEGATIVE
HSV2: NEGATIVE
High risk HPV: NEGATIVE
Neisseria Gonorrhea: NEGATIVE
Trichomonas: NEGATIVE

## 2023-09-21 MED ORDER — PEG 3350-KCL-NA BICARB-NACL 420 G PO SOLR: 4000.0000 mL | Freq: Once | ORAL | 0 refills | Status: AC

## 2023-09-21 NOTE — H&P (View-Only) (Signed)
Primary Care Physician:  Gilmore Laroche, FNP Primary Gastroenterologist:  Dr. Marletta Lor  Chief Complaint  Patient presents with   Colonoscopy    Has issues with constipation and blood in stools at times.    HPI:   Monica [name] is a 52 y.o. female who presents to the clinic today by referral from her PCP Gilmore Laroche for evaluation.  Patient due for screening colonoscopy for colon cancer screening purposes.  No prior colonoscopy.  No family history of colorectal malignancy.  Denies any abdominal pain or unintentional weight loss.  Does have mild constipation, intermittent.  States usually happens when she does not drink enough water throughout the day.  Occasional straining.  Averages bowel movements 1 times daily.  Denies any upper GI symptoms including heartburn, reflux, dysphagia/odynophagia, epigastric or chest pain.  Past Medical History:  Diagnosis Date   Asthma    Back pain    Breast cancer (HCC)    Buttock pain    Family history of breast cancer 09/22/2020   Hypertension    Personal history of chemotherapy    Personal history of radiation therapy     Past Surgical History:  Procedure Laterality Date   ABDOMINAL HYSTERECTOMY     APPENDECTOMY     BREAST LUMPECTOMY WITH RADIOACTIVE SEED AND SENTINEL LYMPH NODE BIOPSY Right 09/10/2020   Procedure: RIGHT BREAST LUMPECTOMY WITH RADIOACTIVE SEED AND SENTINEL LYMPH NODE MAPPING;  Surgeon: Harriette Bouillon, MD;  Location: [facility];  Service: General;  Laterality: Right;   PORTACATH PLACEMENT Right 10/16/2020   Procedure: INSERTION PORT-A-CATH WITH ULTRASOUND GUIDANCE;  Surgeon: Harriette Bouillon, MD;  Location: [facility];  Service: General;  Laterality: Right;   RE-EXCISION OF BREAST LUMPECTOMY Right 10/16/2020   Procedure: RE-EXCISION OF RIGHT BREAST LUMPECTOMY;  Surgeon: Harriette Bouillon, MD;  Location: [facility];  Service: General;  Laterality: Right;   REVERSE  SHOULDER ARTHROPLASTY Left 11/16/2022   Procedure: LEFT REVERSE SHOULDER ARTHROPLASTY;  Surgeon: Huel Cote, MD;  Location: [facility];  Service: Orthopedics;  Laterality: Left;    Current Outpatient Medications  Medication Sig Dispense Refill   ADVAIR HFA 115-21 MCG/ACT inhaler Inhale 2 puffs into the lungs 2 (two) times daily.     albuterol (PROVENTIL) (2.5 MG/3ML) 0.083% nebulizer solution Take 3 mLs (2.5 mg total) by nebulization every 6 (six) hours as needed for wheezing or shortness of breath. 75 mL 1   albuterol (VENTOLIN HFA) 108 (90 Base) MCG/ACT inhaler Inhale 2 puffs into the lungs every 4 (four) hours as needed for wheezing or shortness of breath. 8 g 3   calcium citrate (CALCITRATE - DOSED IN MG ELEMENTAL CALCIUM) 950 (200 Ca) MG tablet Take 200 mg of elemental calcium by mouth daily.     clonazePAM (KLONOPIN) 0.5 MG tablet Take 0.5 mg by mouth daily as needed.     escitalopram (LEXAPRO) 20 MG tablet Take by mouth.     gabapentin (NEURONTIN) 300 MG capsule Take 1 capsule (300 mg total) by mouth 3 (three) times daily as needed (numbness/burning pain). Start with 1 capsule at nighttime, and can increase up to 3 times daily 90 capsule 2   letrozole (FEMARA) 2.5 MG tablet TAKE 1 TABLET ONCE DAILY. 90 tablet 3   lisinopril (ZESTRIL) 10 MG tablet Take 1 tablet (10 mg total) by mouth daily.     temazepam (RESTORIL) 15 MG capsule Take 15 mg by mouth at bedtime as needed for sleep.  No current facility-administered medications for this visit.    Allergies as of 09/21/2023 - Review Complete 09/21/2023  Allergen Reaction Noted   Oxycodone Itching and Swelling 12/22/2022    Family History  Problem Relation Age of Onset   Hypertension Father    Hypertension Sister    Hypertension Maternal Grandmother    Breast cancer Maternal Aunt 18   Breast cancer Paternal Aunt        dx at unknown age   Breast cancer Other        MGM's niece; dx 56s   Breast cancer Other         MGF's niece    Social History   Socioeconomic History   Marital status: Single    Spouse name: Not on file   Number of children: Not on file   Years of education: Not on file   Highest education level: Not on file  Occupational History   Not on file  Tobacco Use   Smoking status: Former    Current packs/day: 0.00    Average packs/day: 0.5 packs/day for 35.0 years (17.5 ttl pk-yrs)    Types: Cigarettes    Start date: 08/22/1985    Quit date: 08/22/2020    Years since quitting: 3.0   Smokeless tobacco: Never  Vaping Use   Vaping status: Never Used  Substance and Sexual Activity   Alcohol use: No   Drug use: Never   Sexual activity: Not Currently    Birth control/protection: Surgical  Other Topics Concern   Not on file  Social History Narrative   Not on file   Social Determinants of Health   Financial Resource Strain: Not on file  Food Insecurity: Not on file  Transportation Needs: Not on file  Physical Activity: Not on file  Stress: Not on file  Social Connections: Not on file  Intimate Partner Violence: Not on file    Subjective: Review of Systems  Constitutional:  Negative for chills and fever.  HENT:  Negative for congestion and hearing loss.   Eyes:  Negative for blurred vision and double vision.  Respiratory:  Negative for cough and shortness of breath.   Cardiovascular:  Negative for chest pain and palpitations.  Gastrointestinal:  Positive for constipation. Negative for abdominal pain, blood in stool, diarrhea, heartburn, melena and vomiting.  Genitourinary:  Negative for dysuria and urgency.  Musculoskeletal:  Negative for joint pain and myalgias.  Skin:  Negative for itching and rash.  Neurological:  Negative for dizziness and headaches.  Psychiatric/Behavioral:  Negative for depression. The patient is not nervous/anxious.        Objective: BP 130/77 (BP Location: Left Arm, Patient Position: Sitting, Cuff Size: Large)   Pulse 77   Temp 98.6  F (37 C) (Oral)   Ht 5\' 6"  (1.676 m)   Wt 194 lb 9.6 oz (88.3 kg)   SpO2 98%   BMI 31.41 kg/m  Physical Exam Constitutional:      Appearance: Normal appearance.  HENT:     Head: Normocephalic and atraumatic.  Eyes:     Extraocular Movements: Extraocular movements intact.     Conjunctiva/sclera: Conjunctivae normal.  Cardiovascular:     Rate and Rhythm: Normal rate and regular rhythm.  Pulmonary:     Effort: Pulmonary effort is normal.     Breath sounds: Normal breath sounds.  Abdominal:     General: Bowel sounds are normal.     Palpations: Abdomen is soft.  Musculoskeletal:  General: No swelling. Normal range of motion.     Cervical back: Normal range of motion and neck supple.  Skin:    General: Skin is warm and dry.     Coloration: Skin is not jaundiced.  Neurological:     General: No focal deficit present.     Mental Status: She is alert and oriented to person, place, and time.  Psychiatric:        Mood and Affect: Mood normal.        Behavior: Behavior normal.      Assessment: *Colon cancer screening *Chronic constipation-mild  Plan: Will schedule for screening colonoscopy.The risks including infection, bleed, or perforation as well as benefits, limitations, alternatives and imponderables have been reviewed with the patient. Questions have been answered. All parties agreeable.  For patient's constipation, I have counseled her on the importance of adequate water hydration of at least 80 ounces daily.  She can take MiraLAX on top of this as needed 1-2 capfuls.  Recommended increase fiber intake in her diet as well.  Thank you Gilmore Laroche for the kind referral.  09/21/2023 9:54 AM   Disclaimer: This note was dictated with voice recognition software. Similar sounding words can inadvertently be transcribed and may not be corrected upon review.

## 2023-09-21 NOTE — Progress Notes (Signed)
Please inform the patient that her pap examination was negative for intraepithelial lesion or malignancy this means that there were no signs of precancerous changes or cancer were found in the cervical cells sampled during the exam; this is a normal result indicating the cervical cells are healthy and there are no indications of cervical cancer or precancer conditions.   She also tested negative for gonorrhea, chlamydia, HSV 1, HSV-2 and trichomonas

## 2023-09-21 NOTE — Progress Notes (Signed)
Primary Care Physician:  Gilmore Laroche, FNP Primary Gastroenterologist:  Dr. Marletta Lor  Chief Complaint  Patient presents with   Colonoscopy    Has issues with constipation and blood in stools at times.    HPI:   Monica [name] is a 52 y.o. female who presents to the clinic today by referral from her PCP Gilmore Laroche for evaluation.  Patient due for screening colonoscopy for colon cancer screening purposes.  No prior colonoscopy.  No family history of colorectal malignancy.  Denies any abdominal pain or unintentional weight loss.  Does have mild constipation, intermittent.  States usually happens when she does not drink enough water throughout the day.  Occasional straining.  Averages bowel movements 1 times daily.  Denies any upper GI symptoms including heartburn, reflux, dysphagia/odynophagia, epigastric or chest pain.  Past Medical History:  Diagnosis Date   Asthma    Back pain    Breast cancer (HCC)    Buttock pain    Family history of breast cancer 09/22/2020   Hypertension    Personal history of chemotherapy    Personal history of radiation therapy     Past Surgical History:  Procedure Laterality Date   ABDOMINAL HYSTERECTOMY     APPENDECTOMY     BREAST LUMPECTOMY WITH RADIOACTIVE SEED AND SENTINEL LYMPH NODE BIOPSY Right 09/10/2020   Procedure: RIGHT BREAST LUMPECTOMY WITH RADIOACTIVE SEED AND SENTINEL LYMPH NODE MAPPING;  Surgeon: Harriette Bouillon, MD;  Location: [facility];  Service: General;  Laterality: Right;   PORTACATH PLACEMENT Right 10/16/2020   Procedure: INSERTION PORT-A-CATH WITH ULTRASOUND GUIDANCE;  Surgeon: Harriette Bouillon, MD;  Location: [facility];  Service: General;  Laterality: Right;   RE-EXCISION OF BREAST LUMPECTOMY Right 10/16/2020   Procedure: RE-EXCISION OF RIGHT BREAST LUMPECTOMY;  Surgeon: Harriette Bouillon, MD;  Location: [facility];  Service: General;  Laterality: Right;   REVERSE  SHOULDER ARTHROPLASTY Left 11/16/2022   Procedure: LEFT REVERSE SHOULDER ARTHROPLASTY;  Surgeon: Huel Cote, MD;  Location: [facility];  Service: Orthopedics;  Laterality: Left;    Current Outpatient Medications  Medication Sig Dispense Refill   ADVAIR HFA 115-21 MCG/ACT inhaler Inhale 2 puffs into the lungs 2 (two) times daily.     albuterol (PROVENTIL) (2.5 MG/3ML) 0.083% nebulizer solution Take 3 mLs (2.5 mg total) by nebulization every 6 (six) hours as needed for wheezing or shortness of breath. 75 mL 1   albuterol (VENTOLIN HFA) 108 (90 Base) MCG/ACT inhaler Inhale 2 puffs into the lungs every 4 (four) hours as needed for wheezing or shortness of breath. 8 g 3   calcium citrate (CALCITRATE - DOSED IN MG ELEMENTAL CALCIUM) 950 (200 Ca) MG tablet Take 200 mg of elemental calcium by mouth daily.     clonazePAM (KLONOPIN) 0.5 MG tablet Take 0.5 mg by mouth daily as needed.     escitalopram (LEXAPRO) 20 MG tablet Take by mouth.     gabapentin (NEURONTIN) 300 MG capsule Take 1 capsule (300 mg total) by mouth 3 (three) times daily as needed (numbness/burning pain). Start with 1 capsule at nighttime, and can increase up to 3 times daily 90 capsule 2   letrozole (FEMARA) 2.5 MG tablet TAKE 1 TABLET ONCE DAILY. 90 tablet 3   lisinopril (ZESTRIL) 10 MG tablet Take 1 tablet (10 mg total) by mouth daily.     temazepam (RESTORIL) 15 MG capsule Take 15 mg by mouth at bedtime as needed for sleep.  No current facility-administered medications for this visit.    Allergies as of 09/21/2023 - Review Complete 09/21/2023  Allergen Reaction Noted   Oxycodone Itching and Swelling 12/22/2022    Family History  Problem Relation Age of Onset   Hypertension Father    Hypertension Sister    Hypertension Maternal Grandmother    Breast cancer Maternal Aunt 18   Breast cancer Paternal Aunt        dx at unknown age   Breast cancer Other        MGM's niece; dx 56s   Breast cancer Other         MGF's niece    Social History   Socioeconomic History   Marital status: Single    Spouse name: Not on file   Number of children: Not on file   Years of education: Not on file   Highest education level: Not on file  Occupational History   Not on file  Tobacco Use   Smoking status: Former    Current packs/day: 0.00    Average packs/day: 0.5 packs/day for 35.0 years (17.5 ttl pk-yrs)    Types: Cigarettes    Start date: 08/22/1985    Quit date: 08/22/2020    Years since quitting: 3.0   Smokeless tobacco: Never  Vaping Use   Vaping status: Never Used  Substance and Sexual Activity   Alcohol use: No   Drug use: Never   Sexual activity: Not Currently    Birth control/protection: Surgical  Other Topics Concern   Not on file  Social History Narrative   Not on file   Social Determinants of Health   Financial Resource Strain: Not on file  Food Insecurity: Not on file  Transportation Needs: Not on file  Physical Activity: Not on file  Stress: Not on file  Social Connections: Not on file  Intimate Partner Violence: Not on file    Subjective: Review of Systems  Constitutional:  Negative for chills and fever.  HENT:  Negative for congestion and hearing loss.   Eyes:  Negative for blurred vision and double vision.  Respiratory:  Negative for cough and shortness of breath.   Cardiovascular:  Negative for chest pain and palpitations.  Gastrointestinal:  Positive for constipation. Negative for abdominal pain, blood in stool, diarrhea, heartburn, melena and vomiting.  Genitourinary:  Negative for dysuria and urgency.  Musculoskeletal:  Negative for joint pain and myalgias.  Skin:  Negative for itching and rash.  Neurological:  Negative for dizziness and headaches.  Psychiatric/Behavioral:  Negative for depression. The patient is not nervous/anxious.        Objective: BP 130/77 (BP Location: Left Arm, Patient Position: Sitting, Cuff Size: Large)   Pulse 77   Temp 98.6  F (37 C) (Oral)   Ht 5\' 6"  (1.676 m)   Wt 194 lb 9.6 oz (88.3 kg)   SpO2 98%   BMI 31.41 kg/m  Physical Exam Constitutional:      Appearance: Normal appearance.  HENT:     Head: Normocephalic and atraumatic.  Eyes:     Extraocular Movements: Extraocular movements intact.     Conjunctiva/sclera: Conjunctivae normal.  Cardiovascular:     Rate and Rhythm: Normal rate and regular rhythm.  Pulmonary:     Effort: Pulmonary effort is normal.     Breath sounds: Normal breath sounds.  Abdominal:     General: Bowel sounds are normal.     Palpations: Abdomen is soft.  Musculoskeletal:  General: No swelling. Normal range of motion.     Cervical back: Normal range of motion and neck supple.  Skin:    General: Skin is warm and dry.     Coloration: Skin is not jaundiced.  Neurological:     General: No focal deficit present.     Mental Status: She is alert and oriented to person, place, and time.  Psychiatric:        Mood and Affect: Mood normal.        Behavior: Behavior normal.      Assessment: *Colon cancer screening *Chronic constipation-mild  Plan: Will schedule for screening colonoscopy.The risks including infection, bleed, or perforation as well as benefits, limitations, alternatives and imponderables have been reviewed with the patient. Questions have been answered. All parties agreeable.  For patient's constipation, I have counseled her on the importance of adequate water hydration of at least 80 ounces daily.  She can take MiraLAX on top of this as needed 1-2 capfuls.  Recommended increase fiber intake in her diet as well.  Thank you Gilmore Laroche for the kind referral.  09/21/2023 9:54 AM   Disclaimer: This note was dictated with voice recognition software. Similar sounding words can inadvertently be transcribed and may not be corrected upon review.

## 2023-09-21 NOTE — Patient Instructions (Signed)
We will schedule you for colonoscopy for colon cancer screening purposes.  For your constipation, recommend drinking at least 80 ounces of water daily.  You can take over-the-counter MiraLAX (polyethylene glycol) 1-2 capfuls as needed.  It was very nice seeing both you today.  Dr. Marletta Lor

## 2023-09-22 ENCOUNTER — Telehealth: Payer: Self-pay | Admitting: Family Medicine

## 2023-09-22 ENCOUNTER — Encounter: Payer: Self-pay | Admitting: *Deleted

## 2023-09-22 NOTE — Telephone Encounter (Signed)
Medical clearance   Noted  Copied Sleeved  Original in PCP box Copy front desk folder

## 2023-09-22 NOTE — Progress Notes (Signed)
Received dental clearance from [facility].  Pt currently on Letrozole and no restrictions for dental work.  RN successfully faxed clearance to [phone].

## 2023-09-26 ENCOUNTER — Encounter
Payer: Medicare Other | Attending: Physical Medicine and Rehabilitation | Admitting: Physical Medicine and Rehabilitation

## 2023-09-26 VITALS — BP 129/84 | HR 69 | Ht 66.0 in | Wt 197.0 lb

## 2023-09-26 DIAGNOSIS — M19012 Primary osteoarthritis, left shoulder: Secondary | ICD-10-CM | POA: Insufficient documentation

## 2023-09-26 DIAGNOSIS — M5412 Radiculopathy, cervical region: Secondary | ICD-10-CM | POA: Diagnosis not present

## 2023-09-26 MED ORDER — PREGABALIN 75 MG PO CAPS: 75.0000 mg | ORAL_CAPSULE | Freq: Two times a day (BID) | ORAL | 5 refills | Status: DC

## 2023-09-26 NOTE — Patient Instructions (Addendum)
Stop gabapentin, and start Lyrica 75 mg twice daily.  If you have any side effects from this medication, return to gabapentin and message me or call clinic to let me know.  Regardless, I want to hear from you in 2 weeks to let me know how this medication transition is going.  I recommend returning to Dr. Alvester Morin for another trial of an epidural steroid injection, given some relief over approximately 2 months with the first injection.  If inadequate relief from a second injection, we may be at the point where cervical surgery is the next reasonable option; the good news is that based on your EMG, it does not look like there is ongoing damage to warrant an emergency surgical evaluation.  I am also ordering physical therapy for your neck and left shoulder, to see if we can try some modalities to reduce your pain and improve your range of motion.  Please attend approximately 6 of the sessions before determining whether they are effective.  Obtain Voltaren gel over-the-counter for use on your left shoulder; this is a topical NSAID, like Celebrex, but should not cause the same stomach side effects.  You can use it up to 4 times daily.  Follow-up with me in 3 months.

## 2023-09-26 NOTE — Progress Notes (Signed)
Subjective:    Patient ID: Monica [name], female    DOB: [birth year], 52 y.o.   MRN: [id number]  HPI   Monica [name] is a 52 y.o. year old female  who  has a past medical history of Asthma, Back pain, Breast cancer (HCC), Buttock pain, Family history of breast cancer (09/22/2020), Hypertension, Personal history of chemotherapy, and Personal history of radiation therapy.   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  Marland Kitchen  Plan from last visit:  Cervical radiculopathy Follow up with me 2 weeks after EMG   Continue bracing and gabapentin   I will fill out [facility] paperwork - finished 8/23   Primary osteoarthritis, left shoulder Less bothersome today than radicular symptoms as above   Discussed injections, defer at this time   Doing well on celebrex; Send me results of bloodwork with your PCP in September   Difficulty sleeping Chronic, uncertain etiology, impacted by life stressors, has trialled multiple medications   For sleep, I want you to pick a time to lay down every night, ideally between 8 and 10 PM.     Starting 1 hour before you want to go to sleep, turn off all television screens, phone screens, tablets, and computers.     Keep the lights low and perform only low stimulation activities, such as reading.     Only use your bedroom for sleep and sex.     You may also take 3 to 5 mg of over-the-counter melatonin approximately 1 hour before bedtime.    Interval Hx:  - Therapies: Never been to for her neck, just her shoulder. Does HEP for the shoulder still.   She currently has a home aide who assists daily for 4 hours; does cooking, cleaning. Patient is independent for dressing, bathing. She does not drive; she has transportation for appointments.    - Follow ups: Saw a new PCP recently.   Had EMG with myself 9/23; discussed  results, L C6 radiculopathy and myopathic changes in L deltoid muscle.    - Falls: none   - DME: none   - Medications: Using gabapentin 300 mg TID consistently; works "ok", stopped celebrex because it was making her stomach hurt. She sleeps with a heating pad on her anterior shoulder, which helps. No gels, creams, patches.   Scheduled 4/22 with Dr Alvester Morin for Left C7-T1 ESI  - "kinda" worked; lasted for about 2 months about 20% reduction in her pain.    - Other concerns: Had Cervical MRI 02/2023  Pain Inventory Average Pain 9 Pain Right Now 7 My pain is sharp, burning, and stabbing  In the last 24 hours, has pain interfered with the following? General activity 0 Relation with others 0 Enjoyment of life 2 What TIME of day is your pain at its worst? morning , daytime, evening, and night Sleep (in general) Fair  Pain is worse with: unsure Pain improves with: medication Relief from Meds: 5  Family History  Problem Relation Age of Onset   Hypertension Father    Hypertension Sister    Hypertension Maternal Grandmother    Breast cancer Maternal Aunt 51   Breast cancer Paternal Aunt        dx at unknown age   Breast cancer Other        MGM's niece; dx 30s   Breast cancer Other  MGF's niece   Social History   Socioeconomic History   Marital status: Single    Spouse name: Not on file   Number of children: Not on file   Years of education: Not on file   Highest education level: Not on file  Occupational History   Not on file  Tobacco Use   Smoking status: Former    Current packs/day: 0.00    Average packs/day: 0.5 packs/day for 35.0 years (17.5 ttl pk-yrs)    Types: Cigarettes    Start date: 08/22/1985    Quit date: 08/22/2020    Years since quitting: 3.0   Smokeless tobacco: Never  Vaping Use   Vaping status: Never Used  Substance and Sexual Activity   Alcohol use: No   Drug use: Never   Sexual activity: Not Currently    Birth control/protection: Surgical   Other Topics Concern   Not on file  Social History Narrative   Not on file   Social Determinants of Health   Financial Resource Strain: Not on file  Food Insecurity: Not on file  Transportation Needs: Not on file  Physical Activity: Not on file  Stress: Not on file  Social Connections: Not on file   Past Surgical History:  Procedure Laterality Date   ABDOMINAL HYSTERECTOMY     APPENDECTOMY     BREAST LUMPECTOMY WITH RADIOACTIVE SEED AND SENTINEL LYMPH NODE BIOPSY Right 09/10/2020   Procedure: RIGHT BREAST LUMPECTOMY WITH RADIOACTIVE SEED AND SENTINEL LYMPH NODE MAPPING;  Surgeon: Harriette Bouillon, MD;  Location: [facility];  Service: General;  Laterality: Right;   PORTACATH PLACEMENT Right 10/16/2020   Procedure: INSERTION PORT-A-CATH WITH ULTRASOUND GUIDANCE;  Surgeon: Harriette Bouillon, MD;  Location: [facility];  Service: General;  Laterality: Right;   RE-EXCISION OF BREAST LUMPECTOMY Right 10/16/2020   Procedure: RE-EXCISION OF RIGHT BREAST LUMPECTOMY;  Surgeon: Harriette Bouillon, MD;  Location: [facility];  Service: General;  Laterality: Right;   REVERSE SHOULDER ARTHROPLASTY Left 11/16/2022   Procedure: LEFT REVERSE SHOULDER ARTHROPLASTY;  Surgeon: Huel Cote, MD;  Location: [facility];  Service: Orthopedics;  Laterality: Left;   Past Surgical History:  Procedure Laterality Date   ABDOMINAL HYSTERECTOMY     APPENDECTOMY     BREAST LUMPECTOMY WITH RADIOACTIVE SEED AND SENTINEL LYMPH NODE BIOPSY Right 09/10/2020   Procedure: RIGHT BREAST LUMPECTOMY WITH RADIOACTIVE SEED AND SENTINEL LYMPH NODE MAPPING;  Surgeon: Harriette Bouillon, MD;  Location: [facility];  Service: General;  Laterality: Right;   PORTACATH PLACEMENT Right 10/16/2020   Procedure: INSERTION PORT-A-CATH WITH ULTRASOUND GUIDANCE;  Surgeon: Harriette Bouillon, MD;  Location: [facility];  Service: General;  Laterality: Right;    RE-EXCISION OF BREAST LUMPECTOMY Right 10/16/2020   Procedure: RE-EXCISION OF RIGHT BREAST LUMPECTOMY;  Surgeon: Harriette Bouillon, MD;  Location: [facility];  Service: General;  Laterality: Right;   REVERSE SHOULDER ARTHROPLASTY Left 11/16/2022   Procedure: LEFT REVERSE SHOULDER ARTHROPLASTY;  Surgeon: Huel Cote, MD;  Location: [facility];  Service: Orthopedics;  Laterality: Left;   Past Medical History:  Diagnosis Date   Asthma    Back pain    Breast cancer (HCC)    Buttock pain    Family history of breast cancer 09/22/2020   Hypertension    Personal history of chemotherapy    Personal history of radiation therapy    There were no vitals taken for this visit.  Opioid Risk Score:  Fall Risk Score:  `1  Depression screen [facility] 2/9     09/15/2023   10:33 AM 09/05/2023    3:27 PM 07/25/2023    9:26 AM 05/23/2023    9:32 AM 03/21/2023    9:40 AM  Depression screen [facility] 2/9  Decreased Interest 1 2 1 1 1   Down, Depressed, Hopeless 1 2 1 1 1   PHQ - 2 Score 2 4 2 2 2   Altered sleeping 2    3  Tired, decreased energy 1    1  Change in appetite 1    0  Feeling bad or failure about yourself  1    1  Trouble concentrating 2    1  Moving slowly or fidgety/restless 1    0  Suicidal thoughts 1    0  PHQ-9 Score 11    8  Difficult doing work/chores Somewhat difficult    Somewhat difficult      Review of Systems  Musculoskeletal:        Left arm pain   All other systems reviewed and are negative.     Objective:   Physical Exam PE: Constitution: Appropriate appearance for age. No apparent distress   Resp: No respiratory distress. No accessory muscle usage.   Cardio: Well perfused appearance. No peripheral edema. Abdomen: Nondistended. Nontender.   Psych: Appropriate mood and affect. Neuro: AAOx4. No apparent cognitive deficits    Neurologic Exam:   DTRs: Reflexes were 2+ in bilateral biceps, BR and triceps.  Hoffmans: negative b/l Sensory  exam:  Decreased sensation to light touch lateral elbow to mid-wrist on L; not extending into fingers.  - unchanged Sensation intact RUE.  + Spurling's for shooting pains down L arm - Slump, - Lhermitte    Motor exam:  4/5 strength throughout LUE  + Ttp over L AC joint Coordination: Fine motor coordination was normal.         Assessment & Plan:   Monica [name] is a 52 y.o. year old female  who  has a past medical history of Asthma, Back pain, Breast cancer (HCC), Buttock pain, Family history of breast cancer (09/22/2020), Hypertension, Personal history of chemotherapy, and Personal history of radiation therapy.   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  Marland Kitchen  Cervical radiculopathy -     Ambulatory referral to Physical Therapy  Discussed results of LUE EMG from 9/26, L C6 radiculopathy and myopathic changes in L deltoid muscle likely post-op.   Stop gabapentin, and start Lyrica 75 mg twice daily.  If you have any side effects from this medication, return to gabapentin and message me or call clinic to let me know.  Regardless, I want to hear from you in 2 weeks to let me know how this medication transition is going.  I recommend returning to Dr. Alvester Morin for another trial of an epidural steroid injection, given some relief over approximately 2 months with the first injection.  If inadequate relief from a second injection, we may be at the point where cervical surgery is the next reasonable option; the good news is that based on your EMG, it does not look like there is ongoing damage to warrant an emergency surgical evaluation.  Follow-up with me in 3 months.  Primary osteoarthritis, left shoulder -     Ambulatory referral to Physical Therapy  I am also  ordering physical therapy for your neck and left shoulder, to see if we can try some modalities to  reduce your pain and improve your range of motion.  Please attend approximately 6 of the sessions before determining whether they are effective.  Obtain Voltaren gel over-the-counter for use on your left shoulder; this is a topical NSAID, like Celebrex, but should not cause the same stomach side effects.  You can use it up to 4 times daily.  Other orders -     Pregabalin; Take 1 capsule (75 mg total) by mouth 2 (two) times daily.  Dispense: 60 capsule; Refill: 5

## 2023-09-28 ENCOUNTER — Encounter: Payer: Self-pay | Admitting: Physical Medicine and Rehabilitation

## 2023-09-30 ENCOUNTER — Ambulatory Visit (INDEPENDENT_AMBULATORY_CARE_PROVIDER_SITE_OTHER): Payer: Medicare Other | Admitting: Family Medicine

## 2023-09-30 ENCOUNTER — Encounter: Payer: Self-pay | Admitting: Family Medicine

## 2023-09-30 VITALS — BP 126/80 | HR 90 | Ht 66.0 in | Wt 196.0 lb

## 2023-09-30 DIAGNOSIS — I10 Essential (primary) hypertension: Secondary | ICD-10-CM | POA: Diagnosis not present

## 2023-09-30 DIAGNOSIS — Z6831 Body mass index (BMI) 31.0-31.9, adult: Secondary | ICD-10-CM

## 2023-09-30 DIAGNOSIS — E669 Obesity, unspecified: Secondary | ICD-10-CM | POA: Diagnosis not present

## 2023-09-30 NOTE — Progress Notes (Unsigned)
Established Patient Office Visit  Subjective:  Patient ID: Monica [name], female    DOB: 1971/02/15  Age: 52 y.o. MRN: [id number]  CC:  Chief Complaint  Patient presents with   Care Management    1 month f/u states she has been doing well with med changes.    Obesity    Has concerns about her weight. Would like to discuss options.     HPI Monica [name] is a 52 y.o. female with past medical history of hypertension presents for blood pressure f/u.  Hypertension: Stable on lisinopril 10 mg daily. The patient is asymptomatic in the clinic and reports treatment compliance.  Obesity: The patient admits to minimal physical activity due to orthopedic conditions. She reports implementing lifestyle changes with a heart-healthy diet.  Past Medical History:  Diagnosis Date   Asthma    Back pain    Breast cancer (HCC)    Buttock pain    Family history of breast cancer 09/22/2020   Hypertension    Personal history of chemotherapy    Personal history of radiation therapy     Past Surgical History:  Procedure Laterality Date   ABDOMINAL HYSTERECTOMY     APPENDECTOMY     BREAST LUMPECTOMY WITH RADIOACTIVE SEED AND SENTINEL LYMPH NODE BIOPSY Right 09/10/2020   Procedure: RIGHT BREAST LUMPECTOMY WITH RADIOACTIVE SEED AND SENTINEL LYMPH NODE MAPPING;  Surgeon: Harriette Bouillon, MD;  Location: [facility];  Service: General;  Laterality: Right;   PORTACATH PLACEMENT Right 10/16/2020   Procedure: INSERTION PORT-A-CATH WITH ULTRASOUND GUIDANCE;  Surgeon: Harriette Bouillon, MD;  Location: [facility];  Service: General;  Laterality: Right;   RE-EXCISION OF BREAST LUMPECTOMY Right 10/16/2020   Procedure: RE-EXCISION OF RIGHT BREAST LUMPECTOMY;  Surgeon: Harriette Bouillon, MD;  Location: [facility];  Service: General;  Laterality: Right;   REVERSE SHOULDER ARTHROPLASTY Left 11/16/2022   Procedure: LEFT REVERSE SHOULDER ARTHROPLASTY;   Surgeon: Huel Cote, MD;  Location:  S[facility]ERY [facility];  Service: Orthopedics;  Laterality: Left;    Family History  Problem Relation Age of Onset   Hypertension Father    Hypertension Sister    Hypertension Maternal Grandmother    Breast cancer Maternal Aunt 17   Breast cancer Paternal Aunt        dx at unknown age   Breast cancer Other        MGM's niece; dx 22s   Breast cancer Other        MGF's niece    Social History   Socioeconomic History   Marital status: Single    Spouse name: Not on file   Number of children: Not on file   Years of education: Not on file   Highest education level: Not on file  Occupational History   Not on file  Tobacco Use   Smoking status: Former    Current packs/day: 0.00    Average packs/day: 0.5 packs/day for 35.0 years (17.5 ttl pk-yrs)    Types: Cigarettes    Start date: 08/22/1985    Quit date: 08/22/2020    Years since quitting: 3.1   Smokeless tobacco: Never  Vaping Use   Vaping status: Never Used  Substance and Sexual Activity   Alcohol use: No   Drug use: Never   Sexual activity: Not Currently    Birth control/protection: Surgical  Other Topics Concern   Not on file  Social History Narrative   Not on file   Social Determinants  of Health   Financial Resource Strain: Not on file  Food Insecurity: Not on file  Transportation Needs: Not on file  Physical Activity: Not on file  Stress: Not on file  Social Connections: Not on file  Intimate Partner Violence: Not on file    Outpatient Medications Prior to Visit  Medication Sig Dispense Refill   ADVAIR [facility] 115-21 MCG/ACT inhaler Inhale 2 puffs into the lungs 2 (two) times daily.     albuterol (PROVENTIL) (2.5 MG/3ML) 0.083% nebulizer solution Take 3 mLs (2.5 mg total) by nebulization every 6 (six) hours as needed for wheezing or shortness of breath. 75 mL 1   albuterol (VENTOLIN HFA) 108 (90 Base) MCG/ACT inhaler Inhale 2 puffs into the lungs every 4 (four) hours  as needed for wheezing or shortness of breath. 8 g 3   calcium citrate (CALCITRATE - DOSED IN MG ELEMENTAL CALCIUM) 950 (200 Ca) MG tablet Take 200 mg of elemental calcium by mouth daily.     clonazePAM (KLONOPIN) 0.5 MG tablet Take 0.5 mg by mouth daily as needed.     escitalopram (LEXAPRO) 20 MG tablet Take by mouth.     gabapentin (NEURONTIN) 300 MG capsule Take 1 capsule (300 mg total) by mouth 3 (three) times daily as needed (numbness/burning pain). Start with 1 capsule at nighttime, and can increase up to 3 times daily 90 capsule 2   letrozole (FEMARA) 2.5 MG tablet TAKE 1 TABLET ONCE DAILY. 90 tablet 3   lisinopril (ZESTRIL) 10 MG tablet Take 1 tablet (10 mg total) by mouth daily.     pregabalin (LYRICA) 75 MG capsule Take 1 capsule (75 mg total) by mouth 2 (two) times daily. 60 capsule 5   temazepam (RESTORIL) 15 MG capsule Take 15 mg by mouth at bedtime as needed for sleep.     No facility-administered medications prior to visit.    Allergies  Allergen Reactions   Oxycodone Itching and Swelling    ROS Review of Systems  Constitutional:  Negative for chills and fever.  Eyes:  Negative for visual disturbance.  Respiratory:  Negative for chest tightness and shortness of breath.   Neurological:  Negative for dizziness and headaches.      Objective:    Physical Exam Constitutional:      Appearance: She is obese.  HENT:     Head: Normocephalic.     Mouth/Throat:     Mouth: Mucous membranes are moist.  Cardiovascular:     Rate and Rhythm: Normal rate.     Heart sounds: Normal heart sounds.  Pulmonary:     Effort: Pulmonary effort is normal.     Breath sounds: Normal breath sounds.  Neurological:     Mental Status: She is alert.     BP 126/80   Pulse 90   Ht 5\' 6"  (1.676 m)   Wt 196 lb (88.9 kg)   SpO2 96%   BMI 31.64 kg/m  Wt Readings from Last 3 Encounters:  09/30/23 196 lb (88.9 kg)  09/26/23 197 lb (89.4 kg)  09/21/23 194 lb 9.6 oz (88.3 kg)    Lab  Results  Component Value Date   TSH 1.560 08/31/2023   Lab Results  Component Value Date   WBC 7.0 08/31/2023   HGB 13.2 08/31/2023   HCT 41.6 08/31/2023   MCV 85 08/31/2023   PLT 228 08/31/2023   Lab Results  Component Value Date   NA 142 08/31/2023   K 4.1 08/31/2023   CO2 22  08/31/2023   GLUCOSE 87 08/31/2023   BUN 7 08/31/2023   CREATININE 0.85 08/31/2023   BILITOT 0.5 08/31/2023   ALKPHOS 141 (H) 08/31/2023   AST 33 08/31/2023   ALT 25 08/31/2023   PROT 7.0 08/31/2023   ALBUMIN 4.3 08/31/2023   CALCIUM 9.8 08/31/2023   ANIONGAP 9 07/31/2022   EGFR 82 08/31/2023   Lab Results  Component Value Date   CHOL 166 08/31/2023   Lab Results  Component Value Date   HDL 52 08/31/2023   Lab Results  Component Value Date   LDLCALC 98 08/31/2023   Lab Results  Component Value Date   TRIG 83 08/31/2023   Lab Results  Component Value Date   CHOLHDL 3.2 08/31/2023   Lab Results  Component Value Date   HGBA1C 5.5 08/31/2023      Assessment & Plan:  Primary hypertension Assessment & Plan: Controlled Encouraged to continue taking lisinopril 10 mg daily as prescribed Encourage low-sodium diet with increase physical activity BP Readings from Last 3 Encounters:  09/30/23 126/80  09/26/23 129/84  09/21/23 130/77      Obesity (BMI 30-39.9) Assessment & Plan: Recommendations for Weight Loss Management:  Emphasize Lifestyle Changes: A heart-healthy diet and increased physical activity are crucial. Healthy Tips for Weight Loss: Increase Intake of Nutrient-Rich Foods: Prioritize fruits, vegetables, and whole grains. Incorporate Lean Proteins: Include chicken, fish, beans, and legumes in your diet. Choose Low-Fat Dairy Products: Opt for dairy products that are low in fat. Reduce Unhealthy Fats: Limit saturated fats, trans fatty acids, and cholesterol. Aim for Regular Physical Activity: Engage in at least 30 minutes of brisk walking or other physical activities on  at least 5 days a week.  Wt Readings from Last 3 Encounters:  09/30/23 196 lb (88.9 kg)  09/26/23 197 lb (89.4 kg)  09/21/23 194 lb 9.6 oz (88.3 kg)      Note: This chart has been completed using Engineer, civil (consulting) software, and while attempts have been made to ensure accuracy, certain words and phrases may not be transcribed as intended.    Follow-up: Return in about 4 months (around 01/31/2024).   Gilmore Laroche, FNP

## 2023-09-30 NOTE — Patient Instructions (Addendum)
I appreciate the opportunity to provide care to you today!    Follow up:  4 months  Recommendations for Weight Loss Management:  Emphasize Lifestyle Changes: A heart-healthy diet and increased physical activity are crucial. Healthy Tips for Weight Loss: Increase Intake of Nutrient-Rich Foods: Prioritize fruits, vegetables, and whole grains. Incorporate Lean Proteins: Include chicken, fish, beans, and legumes in your diet. Choose Low-Fat Dairy Products: Opt for dairy products that are low in fat. Reduce Unhealthy Fats: Limit saturated fats, trans fatty acids, and cholesterol. Aim for Regular Physical Activity: Engage in at least 30 minutes of brisk walking or other physical activities on at least 5 days a week.      Please continue to a heart-healthy diet and increase your physical activities. Try to exercise for at least five days a week.    It was a pleasure to see you and I look forward to continuing to work together on your health and well-being. Please do not hesitate to call the office if you need care or have questions about your care.  In case of emergency, please visit the Emergency Department for urgent care, or contact our clinic at [phone] to schedule an appointment. We're here to help you!   Have a wonderful day and week. With Gratitude, Gilmore Laroche MSN, FNP-BC

## 2023-10-01 DIAGNOSIS — E669 Obesity, unspecified: Secondary | ICD-10-CM | POA: Insufficient documentation

## 2023-10-01 NOTE — Assessment & Plan Note (Signed)
Controlled Encouraged to continue taking lisinopril 10 mg daily as prescribed Encourage low-sodium diet with increase physical activity BP Readings from Last 3 Encounters:  09/30/23 126/80  09/26/23 129/84  09/21/23 130/77

## 2023-10-01 NOTE — Assessment & Plan Note (Signed)
Recommendations for Weight Loss Management:  Emphasize Lifestyle Changes: A heart-healthy diet and increased physical activity are crucial. Healthy Tips for Weight Loss: Increase Intake of Nutrient-Rich Foods: Prioritize fruits, vegetables, and whole grains. Incorporate Lean Proteins: Include chicken, fish, beans, and legumes in your diet. Choose Low-Fat Dairy Products: Opt for dairy products that are low in fat. Reduce Unhealthy Fats: Limit saturated fats, trans fatty acids, and cholesterol. Aim for Regular Physical Activity: Engage in at least 30 minutes of brisk walking or other physical activities on at least 5 days a week.  Wt Readings from Last 3 Encounters:  09/30/23 196 lb (88.9 kg)  09/26/23 197 lb (89.4 kg)  09/21/23 194 lb 9.6 oz (88.3 kg)

## 2023-10-03 ENCOUNTER — Ambulatory Visit
Admission: RE | Admit: 2023-10-03 | Discharge: 2023-10-03 | Disposition: A | Discharge status: Home / Self Care | Payer: Medicare Other | Source: Ambulatory Visit | Attending: Hematology and Oncology

## 2023-10-03 DIAGNOSIS — Z9889 Other specified postprocedural states: Secondary | ICD-10-CM

## 2023-10-11 NOTE — Therapy (Signed)
OUTPATIENT PHYSICAL THERAPY CERVICAL EVALUATION   Patient Name: Monica [name] MRN: [id number] DOB:March 24, 1971, 52 y.o., female Today's Date: 10/12/2023  END OF SESSION:  PT End of Session - 10/12/23 1018     Visit Number 1    Number of Visits 12    Date for PT Re-Evaluation 11/23/23    Authorization Type Medicare Part A & B    PT Start Time 1030    PT Stop Time 1110    PT Time Calculation (min) 40 min    Activity Tolerance Patient tolerated treatment well    Behavior During Therapy WFL for tasks assessed/performed             Past Medical History:  Diagnosis Date   Asthma    Back pain    Breast cancer (HCC)    Buttock pain    Family history of breast cancer 09/22/2020   Hypertension    Personal history of chemotherapy    Personal history of radiation therapy    Past Surgical History:  Procedure Laterality Date   ABDOMINAL HYSTERECTOMY     APPENDECTOMY     BREAST LUMPECTOMY WITH RADIOACTIVE SEED AND SENTINEL LYMPH NODE BIOPSY Right 09/10/2020   Procedure: RIGHT BREAST LUMPECTOMY WITH RADIOACTIVE SEED AND SENTINEL LYMPH NODE MAPPING;  Surgeon: Harriette Bouillon, MD;  Location: [facility];  Service: General;  Laterality: Right;   PORTACATH PLACEMENT Right 10/16/2020   Procedure: INSERTION PORT-A-CATH WITH ULTRASOUND GUIDANCE;  Surgeon: Harriette Bouillon, MD;  Location: [facility];  Service: General;  Laterality: Right;   RE-EXCISION OF BREAST LUMPECTOMY Right 10/16/2020   Procedure: RE-EXCISION OF RIGHT BREAST LUMPECTOMY;  Surgeon: Harriette Bouillon, MD;  Location: [facility];  Service: General;  Laterality: Right;   REVERSE SHOULDER ARTHROPLASTY Left 11/16/2022   Procedure: LEFT REVERSE SHOULDER ARTHROPLASTY;  Surgeon: Huel Cote, MD;  Location:  S[facility];  Service: Orthopedics;  Laterality: Left;   Patient Active Problem List   Diagnosis Date Noted   Obesity (BMI 30-39.9) 10/01/2023   Cervical  cancer screening 09/15/2023   Moderate persistent asthma 08/31/2023   Hypertension 08/31/2023   Difficulty sleeping 07/31/2023   Depression, recurrent (HCC) 03/24/2023   Cervical radiculopathy 03/24/2023   Primary osteoarthritis, left shoulder 11/16/2022   Spondylolisthesis at L5-S1 level 01/04/2022   Lumbar radiculopathy 01/04/2022   Trochanteric bursitis of left hip 11/10/2021   Muscle cramp, nocturnal 11/10/2021   Port-A-Cath in place 12/29/2020   Genetic testing 10/08/2020   Family history of breast cancer 09/22/2020   Primary malignant neoplasm of upper outer quadrant of female breast, right (HCC) 08/27/2020    PCP: Gilmore Laroche, FNP  REFERRING PROVIDER: Angelina Sheriff, DO  REFERRING DIAG:  [phone] (ICD-10-CM) - Cervical radiculopathy M19.012 (ICD-10-CM) - Primary osteoarthritis, left shoulder  THERAPY DIAG:  Cervical spondylosis with radiculopathy  Muscle weakness (generalized)  Stiffness of left shoulder, not elsewhere classified  Other symptoms and signs involving the musculoskeletal system  Rationale for Evaluation and Treatment: Rehabilitation  ONSET DATE: 11/2022   SUBJECTIVE:  SUBJECTIVE STATEMENT: Patient reports that the neck pain began after L TSA in 11/2022. Patient reports shooting, numbness, tingling in L hand. Difficulty with gripping and tends to drop objects at times. Has only been taking her Lyrica as needed rather than per MD note "twice daily". Difficulty lifting, gripping, reaching. Has aide that assists with cleaning, laundry, cooking, and all tasks that require lifting.  Hand dominance: Right  PERTINENT HISTORY:  Had EMG with Dr. Shearon Stalls 9/23; results = L C6 radiculopathy and myopathic changes in L deltoid muscle.  Known to this clinic L TSA  earlier this year and received OT for this.   PAIN:  Are you having pain? Yes: NPRS scale: 10/10 Pain location: neck, L arm  Pain description: shooting, numbness, tingling, achy Aggravating factors: laying on L side Relieving factors: pain medication occasionally (Lyrica), heat, stress ball   PRECAUTIONS: None  RED FLAGS: None     WEIGHT BEARING RESTRICTIONS: No  FALLS:  Has patient fallen in last 6 months? No  LIVING ENVIRONMENT: Lives with: lives alone Lives in: House/apartment Stairs: No Has following equipment at home: None  OCCUPATION: disabled  PLOF: Needs assistance with homemaking - has an aide that helps with cleaning  PATIENT GOALS: to reduce pain  NEXT MD VISIT: 12/28/23  OBJECTIVE:  Note: Objective measures were completed at Evaluation unless otherwise noted.  DIAGNOSTIC FINDINGS:  EMG with Dr. Shearon Stalls 9/23; results = L C6 radiculopathy and myopathic changes in L deltoid muscle.   PATIENT SURVEYS:  FOTO to be assessed next visit   COGNITION: Overall cognitive status: Within functional limits for tasks assessed  SENSATION: Light touch: Impaired - decreased sensation in L forearm  POSTURE: rounded shoulders and forward head  PALPATION: Significant tightness noted to B UT with TTP to L UT   CERVICAL ROM:   Active ROM A/PROM (deg) eval  Flexion 50%  Extension 50%  Right lateral flexion 25%  Left lateral flexion 25%  Right rotation 25%  Left rotation 25%    (Blank rows = not tested)  UPPER EXTREMITY ROM:  Active ROM Right eval Left eval  Shoulder flexion  Limited ~90 degrees   Shoulder extension    Shoulder abduction    Shoulder adduction    Shoulder extension    Shoulder internal rotation    Shoulder external rotation    Elbow flexion    Elbow extension    Wrist flexion    Wrist extension    Wrist ulnar deviation    Wrist radial deviation    Wrist pronation    Wrist supination     (Blank rows = not tested)  UPPER EXTREMITY  MMT:  Unable to tolerate due to significant pain on evaluation  MMT Right eval Left eval  Shoulder flexion    Shoulder extension    Shoulder abduction    Shoulder adduction    Shoulder extension    Shoulder internal rotation    Shoulder external rotation    Middle trapezius    Lower trapezius    Elbow flexion    Elbow extension    Wrist flexion    Wrist extension    Wrist ulnar deviation    Wrist radial deviation    Wrist pronation    Wrist supination    Grip strength     (Blank rows = not tested)  CERVICAL SPECIAL TESTS:  Unable to tolerate 2/2 significant pain on evaluation  FUNCTIONAL TESTS:  Cervical endurance test: to be assessed next visit   TODAY'S  TREATMENT:                                                                                                                              DATE: 10/12/23   PATIENT EDUCATION:  Education details: HEP, POC, goals Person educated: Patient Education method: Explanation, Demonstration, and Handouts Education comprehension: verbalized understanding  HOME EXERCISE PROGRAM: Access Code: [email or web address] URL: [email or web address] Date: 10/12/2023 Prepared by: Maylon Peppers  Exercises - Seated Upper Trapezius Stretch  - 2-3 x daily - 5-7 x weekly - 5 reps - 30 second  hold - Seated Assisted Cervical Rotation with Towel  - 2-3 x daily - 5-7 x weekly - 10 reps - 10-15 hold - Seated Shoulder Rolls  - 2-3 x daily - 5-7 x weekly - 10 reps   ASSESSMENT:  CLINICAL IMPRESSION: Patient is a 52 y.o. female who was seen today for physical therapy evaluation and treatment for cervical pain with radiculopathy to L UE. Patient demonstrates impaired sensation to L UE, significant pain, weakness, and decreased activity tolerance. Evaluation limited due to significant pain in neck on arrival. TTP to L UT with inability to tolerate STM. HEP initiated with self stretching of L UT. Patient will benefit from skilled PT interventions to  address listed impairments to improve QOL and reduce pain.   OBJECTIVE IMPAIRMENTS: decreased activity tolerance, decreased endurance, decreased mobility, decreased ROM, decreased strength, hypomobility, impaired flexibility, impaired sensation, impaired UE functional use, postural dysfunction, and pain.   ACTIVITY LIMITATIONS: carrying, lifting, and reach over head  PARTICIPATION LIMITATIONS: meal prep, cleaning, laundry, driving, community activity, and occupation  PERSONAL FACTORS: Age, Behavior pattern, Past/current experiences, and Time since onset of injury/illness/exacerbation are also affecting patient's functional outcome.   REHAB POTENTIAL: Fair    CLINICAL DECISION MAKING: Stable/uncomplicated  EVALUATION COMPLEXITY: Low   GOALS: Goals reviewed with patient? Yes  SHORT TERM GOALS: Target date: 11/01/2023  Patient will be independent in HEP to improve strength/mobility for better functional independence with ADLs. Baseline: 11/6: HEP initiated Goal status: INITIAL  LONG TERM GOALS: Target date: 11/22/2023  Patient will reduce Neck Disability Index score to <10% to demonstrate minimal disability with ADL's including improved sleeping tolerance, sitting tolerance, etc for better mobility at home and work. Baseline: 11/6: to be given visit #2 Goal status: INITIAL  2.  Patient will report a worst pain of 5/10 on NRPS in neck to improve tolerance with ADLs and reduced symptoms with activities.  Baseline: 11/6: 9/10  Goal status: INITIAL  3.  Patient will improve B UE strength by 1/2 MMT grade to demonstrate improved functional UE strength to be able to complete ADLs and few iADLs without assistance.  Baseline: 11/6: MMT to assessed at visit #2 Goal status: INITIAL  4.  Patient will improve cervical flexor endurance test by >10  seconds to demonstrate improved cervical strength without reports of increased pain/symptoms.  Baseline: 11/6: to be assessed visit #2 Goal  status: INITIAL  PLAN:  PT FREQUENCY: 1-2x/week  PT DURATION: 6 weeks  PLANNED INTERVENTIONS: 97164- PT Re-evaluation, 97110-Therapeutic exercises, 97530- Therapeutic activity, 97112- Neuromuscular re-education, 97535- Self Care, 16109- Manual therapy, 97014- Electrical stimulation (unattended), 97012- Traction (mechanical), Patient/Family education, Dry Needling, Spinal manipulation, Spinal mobilization, Cryotherapy, and Moist heat  PLAN FOR NEXT SESSION: MMT and L shoulder ROM if pain allows, HEP review, NDI completion, cervical endurance test    Viviann Spare, PT, DPT  10/12/2023, 11:32 AM

## 2023-10-12 ENCOUNTER — Encounter (HOSPITAL_COMMUNITY): Payer: Self-pay

## 2023-10-12 ENCOUNTER — Ambulatory Visit (HOSPITAL_COMMUNITY): Payer: Medicare Other | Attending: Physical Medicine and Rehabilitation

## 2023-10-12 DIAGNOSIS — M5412 Radiculopathy, cervical region: Secondary | ICD-10-CM | POA: Diagnosis not present

## 2023-10-12 DIAGNOSIS — M4722 Other spondylosis with radiculopathy, cervical region: Secondary | ICD-10-CM | POA: Insufficient documentation

## 2023-10-12 DIAGNOSIS — M19012 Primary osteoarthritis, left shoulder: Secondary | ICD-10-CM | POA: Diagnosis not present

## 2023-10-12 DIAGNOSIS — M6281 Muscle weakness (generalized): Secondary | ICD-10-CM | POA: Insufficient documentation

## 2023-10-12 DIAGNOSIS — R29898 Other symptoms and signs involving the musculoskeletal system: Secondary | ICD-10-CM | POA: Insufficient documentation

## 2023-10-12 DIAGNOSIS — M25612 Stiffness of left shoulder, not elsewhere classified: Secondary | ICD-10-CM | POA: Insufficient documentation

## 2023-10-13 ENCOUNTER — Encounter: Payer: Self-pay | Admitting: Hematology and Oncology

## 2023-10-14 NOTE — Therapy (Signed)
OUTPATIENT PHYSICAL THERAPY CERVICAL TREATMENT   Patient Name: Monica [name] MRN: [id number] DOB:[birth year], 52 y.o., female Today's Date: 10/17/2023  END OF SESSION:  PT End of Session - 10/17/23 0846     Visit Number 2    Number of Visits 12    Date for PT Re-Evaluation 11/23/23    Authorization Type Medicare Part A & B    PT Start Time 0846    PT Stop Time 0925    PT Time Calculation (min) 39 min    Activity Tolerance Patient tolerated treatment well    Behavior During Therapy [facility] for tasks assessed/performed              Past Medical History:  Diagnosis Date   Asthma    Back pain    Breast cancer (HCC)    Buttock pain    Family history of breast cancer 09/22/2020   Hypertension    Personal history of chemotherapy    Personal history of radiation therapy    Past Surgical History:  Procedure Laterality Date   ABDOMINAL HYSTERECTOMY     APPENDECTOMY     BREAST LUMPECTOMY WITH RADIOACTIVE SEED AND SENTINEL LYMPH NODE BIOPSY Right 09/10/2020   Procedure: RIGHT BREAST LUMPECTOMY WITH RADIOACTIVE SEED AND SENTINEL LYMPH NODE MAPPING;  Surgeon: Harriette Bouillon, MD;  Location: [facility];  Service: General;  Laterality: Right;   PORTACATH PLACEMENT Right 10/16/2020   Procedure: INSERTION PORT-A-CATH WITH ULTRASOUND GUIDANCE;  Surgeon: Harriette Bouillon, MD;  Location: [facility];  Service: General;  Laterality: Right;   RE-EXCISION OF BREAST LUMPECTOMY Right 10/16/2020   Procedure: RE-EXCISION OF RIGHT BREAST LUMPECTOMY;  Surgeon: Harriette Bouillon, MD;  Location: [facility];  Service: General;  Laterality: Right;   REVERSE SHOULDER ARTHROPLASTY Left 11/16/2022   Procedure: LEFT REVERSE SHOULDER ARTHROPLASTY;  Surgeon: Huel Cote, MD;  Location: [facility];  Service: Orthopedics;  Laterality: Left;   Patient Active Problem List   Diagnosis Date Noted   Obesity (BMI 30-39.9) 10/01/2023   Cervical  cancer screening 09/15/2023   Moderate persistent asthma 08/31/2023   Hypertension 08/31/2023   Difficulty sleeping 07/31/2023   Depression, recurrent (HCC) 03/24/2023   Cervical radiculopathy 03/24/2023   Primary osteoarthritis, left shoulder 11/16/2022   Spondylolisthesis at L5-S1 level 01/04/2022   Lumbar radiculopathy 01/04/2022   Trochanteric bursitis of left hip 11/10/2021   Muscle cramp, nocturnal 11/10/2021   Port-A-Cath in place 12/29/2020   Genetic testing 10/08/2020   Family history of breast cancer 09/22/2020   Primary malignant neoplasm of upper outer quadrant of female breast, right (HCC) 08/27/2020    PCP: Gilmore Laroche, FNP  REFERRING PROVIDER: Angelina Sheriff, DO  REFERRING DIAG:  [phone] (ICD-10-CM) - Cervical radiculopathy M19.012 (ICD-10-CM) - Primary osteoarthritis, left shoulder  THERAPY DIAG:  Cervical spondylosis with radiculopathy  Muscle weakness (generalized)  Stiffness of left shoulder, not elsewhere classified  Rationale for Evaluation and Treatment: Rehabilitation  ONSET DATE: 11/2022   SUBJECTIVE:  SUBJECTIVE STATEMENT:  Patient reports that the neck pain began after L TSA in 11/2022. Patient reports shooting, numbness, tingling in L hand. Difficulty with gripping and tends to drop objects at times. Has only been taking her Lyrica as needed rather than per MD note "twice daily". Difficulty lifting, gripping, reaching. Has aide that assists with cleaning, laundry, cooking, and all tasks that require lifting.  Hand dominance: Right  PERTINENT HISTORY:  Had EMG with Dr. Shearon Stalls 9/23; results = L C6 radiculopathy and myopathic changes in L deltoid muscle.  Known to this clinic L TSA earlier this year and received OT for this.   PAIN:  Are you  having pain? Yes: NPRS scale: 10/10 Pain location: neck, L arm  Pain description: shooting, numbness, tingling, achy Aggravating factors: laying on L side Relieving factors: pain medication occasionally (Lyrica), heat, stress ball   PRECAUTIONS: None  RED FLAGS: None     WEIGHT BEARING RESTRICTIONS: No  FALLS:  Has patient fallen in last 6 months? No  LIVING ENVIRONMENT: Lives with: lives alone Lives in: House/apartment Stairs: No Has following equipment at home: None  OCCUPATION: disabled  PLOF: Needs assistance with homemaking - has an aide that helps with cleaning  PATIENT GOALS: to reduce pain  NEXT MD VISIT: 12/28/23  OBJECTIVE:  Note: Objective measures were completed at Evaluation unless otherwise noted.  DIAGNOSTIC FINDINGS:  EMG with Dr. Shearon Stalls 9/23; results = L C6 radiculopathy and myopathic changes in L deltoid muscle.   PATIENT SURVEYS:  NDI 62%  COGNITION: Overall cognitive status: Within functional limits for tasks assessed  SENSATION: Light touch: Impaired - decreased sensation in L forearm  POSTURE: rounded shoulders and forward head  PALPATION: Significant tightness noted to B UT with TTP to L UT   CERVICAL ROM:   Active ROM A/PROM (deg) eval  Flexion 50%  Extension 50%  Right lateral flexion 25%  Left lateral flexion 25%  Right rotation 25%  Left rotation 25%    (Blank rows = not tested)  UPPER EXTREMITY ROM:  Active ROM Right eval Left eval  Shoulder flexion  Limited ~90 degrees   Shoulder extension    Shoulder abduction    Shoulder adduction    Shoulder extension    Shoulder internal rotation    Shoulder external rotation    Elbow flexion    Elbow extension    Wrist flexion    Wrist extension    Wrist ulnar deviation    Wrist radial deviation    Wrist pronation    Wrist supination     (Blank rows = not tested)  UPPER EXTREMITY MMT:  Unable to tolerate due to significant pain on evaluation on eval   MMT  Right 10/17/23 Left 10/17/23  Shoulder flexion 3-* 3-*  Shoulder extension    Shoulder abduction 2+* 2+*  Shoulder adduction    Shoulder extension    Shoulder internal rotation 3-* 3-*  Shoulder external rotation 3-* 3-*  Middle trapezius    Lower trapezius    Elbow flexion 3-* 3-*  Elbow extension 3-* 3-*  Wrist flexion 2+* 2+*  Wrist extension 2+* 2+*  Wrist ulnar deviation    Wrist radial deviation    Wrist pronation    Wrist supination    Grip strength     (Blank rows = not tested)  CERVICAL SPECIAL TESTS:  Unable to tolerate 2/2 significant pain on evaluation  FUNCTIONAL TESTS:  Cervical endurance test: to be assessed next visit   TODAY'S TREATMENT:  DATE: 10/17/23   Subjective: patient reports neck pain is 7/10 this AM due to the weather. Has been completing HEP with some minor relief.   NDI: 62%  MMT: see above   Manual: STM to B UT, levator, and suboccipitals x 20 minutes Suboccipital release 2-3 x 20 seconds L/R UT stretch with GHJ overpressure 3 x 30 seconds each side    PATIENT EDUCATION:  Education details: HEP, POC, goals Person educated: Patient Education method: Explanation, Demonstration, and Handouts Education comprehension: verbalized understanding  HOME EXERCISE PROGRAM: Access Code: [email or web address] URL: [email or web address] Date: 10/12/2023 Prepared by: Maylon Peppers  Exercises - Seated Upper Trapezius Stretch  - 2-3 x daily - 5-7 x weekly - 5 reps - 30 second  hold - Seated Assisted Cervical Rotation with Towel  - 2-3 x daily - 5-7 x weekly - 10 reps - 10-15 hold - Seated Shoulder Rolls  - 2-3 x daily - 5-7 x weekly - 10 reps   ASSESSMENT:  CLINICAL IMPRESSION:   Patient arrives to treatment session with continued severe pain across shoulders. Session focused on manual STM and stretching focused on B  UT, suboccipitals, and levator. Completed NDI with score of 62% and completed MMT with muscle weakness noted but limited by pain. Patient with improvement of pain and flexibility at end of session per patient report. Patient will benefit from skilled PT interventions to address listed impairments to improve QOL and reduce pain.   OBJECTIVE IMPAIRMENTS: decreased activity tolerance, decreased endurance, decreased mobility, decreased ROM, decreased strength, hypomobility, impaired flexibility, impaired sensation, impaired UE functional use, postural dysfunction, and pain.   ACTIVITY LIMITATIONS: carrying, lifting, and reach over head  PARTICIPATION LIMITATIONS: meal prep, cleaning, laundry, driving, community activity, and occupation  PERSONAL FACTORS: Age, Behavior pattern, Past/current experiences, and Time since onset of injury/illness/exacerbation are also affecting patient's functional outcome.   REHAB POTENTIAL: Fair    CLINICAL DECISION MAKING: Stable/uncomplicated  EVALUATION COMPLEXITY: Low   GOALS: Goals reviewed with patient? Yes  SHORT TERM GOALS: Target date: 11/01/2023  Patient will be independent in HEP to improve strength/mobility for better functional independence with ADLs. Baseline: 11/6: HEP initiated Goal status: INITIAL  LONG TERM GOALS: Target date: 11/22/2023  Patient will reduce Neck Disability Index score to <30% to demonstrate minimal disability with ADL's including improved sleeping tolerance, sitting tolerance, etc for better mobility at home and work. Baseline: 11/6: to be given visit #2; 11/11: 62% Goal status: INITIAL  2.  Patient will report a worst pain of 5/10 on NRPS in neck to improve tolerance with ADLs and reduced symptoms with activities.  Baseline: 11/6: 9/10  Goal status: INITIAL  3.  Patient will improve B UE strength by 1/2 MMT grade to demonstrate improved functional UE strength to be able to complete ADLs and few iADLs without  assistance.  Baseline: 11/6: MMT to assessed at visit #2 Goal status: INITIAL  4.  Patient will improve cervical flexor endurance test by >10  seconds to demonstrate improved cervical strength without reports of increased pain/symptoms.  Baseline: 11/6: to be assessed visit #2 Goal status: INITIAL   PLAN:  PT FREQUENCY: 1-2x/week  PT DURATION: 6 weeks  PLANNED INTERVENTIONS: 97164- PT Re-evaluation, 97110-Therapeutic exercises, 97530- Therapeutic activity, 97112- Neuromuscular re-education, 97535- Self Care, 16109- Manual therapy, 97014- Electrical stimulation (unattended), 97012- Traction (mechanical), Patient/Family education, Dry Needling, Spinal manipulation, Spinal mobilization, Cryotherapy, and Moist heat  PLAN FOR NEXT SESSION: cervical endurance test, manual therapy and light scapular strengthening    Keymiah Lyles  Janalee Dane, PT, DPT  10/17/2023, 9:28 AM

## 2023-10-17 ENCOUNTER — Encounter (HOSPITAL_COMMUNITY): Payer: Self-pay

## 2023-10-17 ENCOUNTER — Ambulatory Visit (HOSPITAL_COMMUNITY): Payer: Medicare Other

## 2023-10-17 DIAGNOSIS — M4722 Other spondylosis with radiculopathy, cervical region: Secondary | ICD-10-CM

## 2023-10-17 DIAGNOSIS — M6281 Muscle weakness (generalized): Secondary | ICD-10-CM

## 2023-10-17 DIAGNOSIS — M25612 Stiffness of left shoulder, not elsewhere classified: Secondary | ICD-10-CM

## 2023-10-17 NOTE — Patient Instructions (Signed)
Monica [name] [facility]  10/17/2023     @PREFPERIOPPHARMACY @   Your procedure is scheduled on  10/21/2023.   Report to [facility] at  0900 A.M.   Call this number if you have problems the morning of surgery:  [phone]  If you experience any cold or flu symptoms such as cough, fever, chills, shortness of breath, etc. between now and your scheduled surgery, please notify us at the above number.   Remember:  Follow the diet and prep instructions given to you by the office.   You may drink clear liquids until 0700 am on 10/21/2023.     Clear liquids allowed are:                    Water, Juice (No red color; non-citric and without pulp; diabetics please choose diet or no sugar options), Carbonated beverages (diabetics please choose diet or no sugar options), Clear Tea (No creamer, milk, or cream, including half & half and powdered creamer), Black Coffee Only (No creamer, milk or cream, including half & half and powdered creamer), Plain Jell-O Only (No red color; diabetics please choose no sugar options), Clear Sports drink (No red color; diabetics please choose diet or no sugar options), and Plain Popsicles Only (No red color; diabetics please choose no sugar options)    Take these medicines the morning of surgery with A SIP OF WATER                  clonazepam, escitalopram, gabapentin, pregabalin.    Do not wear jewelry, make-up or nail polish, including gel polish,  artificial nails, or any other type of covering on natural nails (fingers and  toes).  Do not wear lotions, powders, or perfumes, or deodorant.  Do not shave 48 hours prior to surgery.  Men may shave face and neck.  Do not bring valuables to the hospital.  [facility] is not responsible for any belongings or valuables.  Contacts, dentures or bridgework may not be worn into surgery.  Leave your suitcase in the car.  After surgery it may be brought to your room.  For patients admitted to the hospital,  discharge time will be determined by your treatment team.  Patients discharged the day of surgery will not be allowed to drive home and must have someone with them for 24 hours.    Special instructions:   DO NOT smoke tobacco or vape for 24 hors before your procedure.  Please read over the following fact sheets that you were given. Anesthesia Post-op Instructions and Care and Recovery After Surgery      Colonoscopy, Adult, Care After The following information offers guidance on how to care for yourself after your procedure. Your health care provider may also give you more specific instructions. If you have problems or questions, contact your health care provider. What can I expect after the procedure? After the procedure, it is common to have: A small amount of blood in your stool for 24 hours after the procedure. Some gas. Mild cramping or bloating of your abdomen. Follow these instructions at home: Eating and drinking  Drink enough fluid to keep your urine pale yellow. Follow instructions from your health care provider about eating or drinking restrictions. Resume your normal diet as told by your health care provider. Avoid heavy or fried foods that are hard to digest. Activity Rest as told by your health care provider. Avoid sitting for a long time without  moving. Get up to take short walks every 1-2 hours. This is important to improve blood flow and breathing. Ask for help if you feel weak or unsteady. Return to your normal activities as told by your health care provider. Ask your health care provider what activities are safe for you. Managing cramping and bloating  Try walking around when you have cramps or feel bloated. If directed, apply heat to your abdomen as told by your health care provider. Use the heat source that your health care provider recommends, such as a moist heat pack or a heating pad. Place a towel between your skin and the heat source. Leave the heat on for  20-30 minutes. Remove the heat if your skin turns bright red. This is especially important if you are unable to feel pain, heat, or cold. You have a greater risk of getting burned. General instructions If you were given a sedative during the procedure, it can affect you for several hours. Do not drive or operate machinery until your health care provider says that it is safe. For the first 24 hours after the procedure: Do not sign important documents. Do not drink alcohol. Do your regular daily activities at a slower pace than normal. Eat soft foods that are easy to digest. Take over-the-counter and prescription medicines only as told by your health care provider. Keep all follow-up visits. This is important. Contact a health care provider if: You have blood in your stool 2-3 days after the procedure. Get help right away if: You have more than a small spotting of blood in your stool. You have large blood clots in your stool. You have swelling of your abdomen. You have nausea or vomiting. You have a fever. You have increasing pain in your abdomen that is not relieved with medicine. These symptoms may be an emergency. Get help right away. Call 911. Do not wait to see if the symptoms will go away. Do not drive yourself to the hospital. Summary After the procedure, it is common to have a small amount of blood in your stool. You may also have mild cramping and bloating of your abdomen. If you were given a sedative during the procedure, it can affect you for several hours. Do not drive or operate machinery until your health care provider says that it is safe. Get help right away if you have a lot of blood in your stool, nausea or vomiting, a fever, or increased pain in your abdomen. This information is not intended to replace advice given to you by your health care provider. Make sure you discuss any questions you have with your health care provider. Document Revised: 01/04/2023 Document  Reviewed: 07/15/2021 Elsevier Patient Education  2024 [employer] Monitored Anesthesia Care, Care After The following information offers guidance on how to care for yourself after your procedure. Your health care provider may also give you more specific instructions. If you have problems or questions, contact your health care provider. What can I expect after the procedure? After the procedure, it is common to have: Tiredness. Little or no memory about what happened during or after the procedure. Impaired judgment when it comes to making decisions. Nausea or vomiting. Some trouble with balance. Follow these instructions at home: For the time period you were told by your health care provider:  Rest. Do not participate in activities where you could fall or become injured. Do not drive or use machinery. Do not drink alcohol. Do not take sleeping pills or medicines that  cause drowsiness. Do not make important decisions or sign legal documents. Do not take care of children on your own. Medicines Take over-the-counter and prescription medicines only as told by your health care provider. If you were prescribed antibiotics, take them as told by your health care provider. Do not stop using the antibiotic even if you start to feel better. Eating and drinking Follow instructions from your health care provider about what you may eat and drink. Drink enough fluid to keep your urine pale yellow. If you vomit: Drink clear fluids slowly and in small amounts as you are able. Clear fluids include water, ice chips, low-calorie sports drinks, and fruit juice that has water added to it (diluted fruit juice). Eat light and bland foods in small amounts as you are able. These foods include bananas, applesauce, rice, lean meats, toast, and crackers. General instructions  Have a responsible adult stay with you for the time you are told. It is important to have someone help care for you until you are awake  and alert. If you have sleep apnea, surgery and some medicines can increase your risk for breathing problems. Follow instructions from your health care provider about wearing your sleep device: When you are sleeping. This includes during daytime naps. While taking prescription pain medicines, sleeping medicines, or medicines that make you drowsy. Do not use any products that contain nicotine or tobacco. These products include cigarettes, chewing tobacco, and vaping devices, such as e-cigarettes. If you need help quitting, ask your health care provider. Contact a health care provider if: You feel nauseous or vomit every time you eat or drink. You feel light-headed. You are still sleepy or having trouble with balance after 24 hours. You get a rash. You have a fever. You have redness or swelling around the IV site. Get help right away if: You have trouble breathing. You have new confusion after you get home. These symptoms may be an emergency. Get help right away. Call 911. Do not wait to see if the symptoms will go away. Do not drive yourself to the hospital. This information is not intended to replace advice given to you by your health care provider. Make sure you discuss any questions you have with your health care provider. Document Revised: 04/19/2022 Document Reviewed: 04/19/2022 Elsevier Patient Education  2024 [employer].

## 2023-10-18 ENCOUNTER — Encounter (HOSPITAL_COMMUNITY): Payer: Self-pay

## 2023-10-18 ENCOUNTER — Encounter (HOSPITAL_COMMUNITY)
Admission: RE | Admit: 2023-10-18 | Discharge: 2023-10-18 | Disposition: A | Discharge status: Home / Self Care | Payer: Medicare Other | Source: Ambulatory Visit | Attending: Internal Medicine

## 2023-10-18 VITALS — BP 126/80 | HR 90 | Temp 97.8°F | Resp 18 | Ht 66.0 in | Wt 196.0 lb

## 2023-10-18 DIAGNOSIS — Z01812 Encounter for preprocedural laboratory examination: Secondary | ICD-10-CM | POA: Diagnosis present

## 2023-10-18 DIAGNOSIS — I10 Essential (primary) hypertension: Secondary | ICD-10-CM | POA: Insufficient documentation

## 2023-10-18 DIAGNOSIS — Z01810 Encounter for preprocedural cardiovascular examination: Secondary | ICD-10-CM | POA: Diagnosis present

## 2023-10-18 DIAGNOSIS — Z01818 Encounter for other preprocedural examination: Secondary | ICD-10-CM | POA: Diagnosis not present

## 2023-10-18 HISTORY — DX: Unspecified osteoarthritis, unspecified site: M19.90

## 2023-10-18 HISTORY — DX: Dyspnea, unspecified: R06.00

## 2023-10-21 ENCOUNTER — Ambulatory Visit (HOSPITAL_COMMUNITY): Payer: Medicare Other | Admitting: Certified Registered Nurse Anesthetist

## 2023-10-21 ENCOUNTER — Encounter (HOSPITAL_COMMUNITY)
Admission: RE | Disposition: A | Discharge status: Home / Self Care | Payer: Self-pay | Source: Home / Self Care | Attending: Internal Medicine

## 2023-10-21 ENCOUNTER — Encounter (HOSPITAL_COMMUNITY): Payer: Self-pay

## 2023-10-21 ENCOUNTER — Ambulatory Visit (HOSPITAL_COMMUNITY)
Admission: RE | Admit: 2023-10-21 | Discharge: 2023-10-21 | Disposition: A | Discharge status: Home / Self Care | Payer: Medicare Other | Attending: Internal Medicine | Admitting: Internal Medicine

## 2023-10-21 DIAGNOSIS — Z885 Allergy status to narcotic agent status: Secondary | ICD-10-CM | POA: Insufficient documentation

## 2023-10-21 DIAGNOSIS — J45909 Unspecified asthma, uncomplicated: Secondary | ICD-10-CM | POA: Insufficient documentation

## 2023-10-21 DIAGNOSIS — D124 Benign neoplasm of descending colon: Secondary | ICD-10-CM | POA: Insufficient documentation

## 2023-10-21 DIAGNOSIS — I10 Essential (primary) hypertension: Secondary | ICD-10-CM

## 2023-10-21 DIAGNOSIS — Z87891 Personal history of nicotine dependence: Secondary | ICD-10-CM | POA: Insufficient documentation

## 2023-10-21 DIAGNOSIS — D126 Benign neoplasm of colon, unspecified: Secondary | ICD-10-CM | POA: Diagnosis not present

## 2023-10-21 DIAGNOSIS — Z1211 Encounter for screening for malignant neoplasm of colon: Secondary | ICD-10-CM

## 2023-10-21 DIAGNOSIS — D125 Benign neoplasm of sigmoid colon: Secondary | ICD-10-CM | POA: Insufficient documentation

## 2023-10-21 HISTORY — PX: COLONOSCOPY WITH PROPOFOL: SHX5780

## 2023-10-21 HISTORY — PX: POLYPECTOMY: SHX149

## 2023-10-21 SURGERY — COLONOSCOPY WITH PROPOFOL
Anesthesia: General

## 2023-10-21 MED ORDER — LACTATED RINGERS IV SOLN: INTRAVENOUS | Status: DC | PRN

## 2023-10-21 MED ORDER — PROPOFOL 500 MG/50ML IV EMUL
INTRAVENOUS | Status: DC | PRN
  Administered 2023-10-21: 150 ug/kg/min via INTRAVENOUS
  Administered 2023-10-21: 60 mg via INTRAVENOUS

## 2023-10-21 MED ORDER — PROPOFOL 500 MG/50ML IV EMUL
INTRAVENOUS | Status: AC
  Filled 2023-10-21: qty 50

## 2023-10-21 NOTE — Transfer of Care (Signed)
Immediate Anesthesia Transfer of Care Note  Patient: Monica [name]  Procedure(s) Performed: COLONOSCOPY WITH PROPOFOL POLYPECTOMY INTESTINAL  Patient Location: Short Stay  Anesthesia Type:General  Level of Consciousness: awake, alert , and oriented  Airway & Oxygen Therapy: Patient Spontanous Breathing  Post-op Assessment: Report given to RN, Post -op Vital signs reviewed and stable, Patient moving all extremities X 4, and Patient able to stick tongue midline  Post vital signs: Reviewed and stable  Last Vitals:  Vitals Value Taken Time  BP 121/72   Temp 98.6   Pulse 75   Resp 19   SpO2 94     Last Pain:  Vitals:   10/21/23 1110  TempSrc:   PainSc: 0-No pain      Patients Stated Pain Goal: 5 (10/21/23 1024)  Complications: No notable events documented.

## 2023-10-21 NOTE — Interval H&P Note (Signed)
History and Physical Interval Note:  10/21/2023 11:00 AM  Monica [name]  has presented today for surgery, with the diagnosis of CA screen.  The various methods of treatment have been discussed with the patient and family. After consideration of risks, benefits and other options for treatment, the patient has consented to  Procedure(s) with comments: COLONOSCOPY WITH PROPOFOL (N/A) - 11:00am, asa 3 as a surgical intervention.  The patient's history has been reviewed, patient examined, no change in status, stable for surgery.  I have reviewed the patient's chart and labs.  Questions were answered to the patient's satisfaction.     Lanelle Bal

## 2023-10-21 NOTE — Discharge Instructions (Addendum)
  Colonoscopy Discharge Instructions  Read the instructions outlined below and refer to this sheet in the next few weeks. These discharge instructions provide you with general information on caring for yourself after you leave the hospital. Your doctor may also give you specific instructions. While your treatment has been planned according to the most current medical practices available, unavoidable complications occasionally occur.   ACTIVITY You may resume your regular activity, but move at a slower pace for the next 24 hours.  Take frequent rest periods for the next 24 hours.  Walking will help get rid of the air and reduce the bloated feeling in your belly (abdomen).  No driving for 24 hours (because of the medicine (anesthesia) used during the test).   Do not sign any important legal documents or operate any machinery for 24 hours (because of the anesthesia used during the test).  NUTRITION Drink plenty of fluids.  You may resume your normal diet as instructed by your doctor.  Begin with a light meal and progress to your normal diet. Heavy or fried foods are harder to digest and may make you feel sick to your stomach (nauseated).  Avoid alcoholic beverages for 24 hours or as instructed.  MEDICATIONS You may resume your normal medications unless your doctor tells you otherwise.  WHAT YOU CAN EXPECT TODAY Some feelings of bloating in the abdomen.  Passage of more gas than usual.  Spotting of blood in your stool or on the toilet paper.  IF YOU HAD POLYPS REMOVED DURING THE COLONOSCOPY: No aspirin products for 7 days or as instructed.  No alcohol for 7 days or as instructed.  Eat a soft diet for the next 24 hours.  FINDING OUT THE RESULTS OF YOUR TEST Not all test results are available during your visit. If your test results are not back during the visit, make an appointment with your caregiver to find out the results. Do not assume everything is normal if you have not heard from your  caregiver or the medical facility. It is important for you to follow up on all of your test results.  SEEK IMMEDIATE MEDICAL ATTENTION IF: You have more than a spotting of blood in your stool.  Your belly is swollen (abdominal distention).  You are nauseated or vomiting.  You have a temperature over 101.  You have abdominal pain or discomfort that is severe or gets worse throughout the day.   Your colonoscopy revealed 6 polyp(s) which I removed successfully. 1 polyp large. Await pathology results, my office will contact you. I recommend repeating colonoscopy in 3 years for surveillance purposes.    I hope you have a great rest of your week!  Hennie Duos. Marletta Lor, D.O. Gastroenterology and Hepatology [facility]

## 2023-10-21 NOTE — Anesthesia Preprocedure Evaluation (Addendum)
Anesthesia Evaluation  Patient identified by MRN, date of birth, ID band Patient awake    Reviewed: Allergy & Precautions, NPO status , Patient's Chart, lab work & pertinent test results  Airway Mallampati: II  TM Distance: >3 FB Neck ROM: Full    Dental  (+) Dental Advisory Given, Edentulous Upper   Pulmonary shortness of breath, asthma , Patient abstained from smoking., former smoker   Pulmonary exam normal breath sounds clear to auscultation       Cardiovascular Exercise Tolerance: Good hypertension, Normal cardiovascular exam Rhythm:Regular Rate:Normal     Neuro/Psych  PSYCHIATRIC DISORDERS  Depression     Neuromuscular disease    GI/Hepatic negative GI ROS, Neg liver ROS,,,  Endo/Other  negative endocrine ROS    Renal/GU      Musculoskeletal  (+) Arthritis , Osteoarthritis,    Abdominal  (+) + obese  Peds  Hematology negative hematology ROS (+)   Anesthesia Other Findings Breast Cancer.  Allergic Oxycodone!!!  Reproductive/Obstetrics                             Anesthesia Physical Anesthesia Plan  ASA: 3  Anesthesia Plan: General   Post-op Pain Management: Minimal or no pain anticipated   Induction: Intravenous  PONV Risk Score and Plan: 3  Airway Management Planned: Nasal Cannula and Natural Airway  Additional Equipment: None  Intra-op Plan:   Post-operative Plan:   Informed Consent: I have reviewed the patients History and Physical, chart, labs and discussed the procedure including the risks, benefits and alternatives for the proposed anesthesia with the patient or authorized representative who has indicated his/her understanding and acceptance.     Dental advisory given  Plan Discussed with: CRNA  Anesthesia Plan Comments:         Anesthesia Quick Evaluation

## 2023-10-21 NOTE — Anesthesia Postprocedure Evaluation (Signed)
Anesthesia Post Note  Patient: Monica [name]  Procedure(s) Performed: COLONOSCOPY WITH PROPOFOL POLYPECTOMY INTESTINAL  Patient location during evaluation: PACU Anesthesia Type: General Level of consciousness: awake and alert Pain management: pain level controlled Vital Signs Assessment: post-procedure vital signs reviewed and stable Respiratory status: spontaneous breathing, nonlabored ventilation, respiratory function stable and patient connected to nasal cannula oxygen Cardiovascular status: blood pressure returned to baseline and stable Postop Assessment: no apparent nausea or vomiting Anesthetic complications: no   There were no known notable events for this encounter.   Last Vitals:  Vitals:   10/21/23 1024 10/21/23 1150  BP: (!) 138/94 121/72  Pulse: 82 73  Resp: (!) 26 18  Temp: 36.8 C 36.6 C  SpO2: 100% 95%    Last Pain:  Vitals:   10/21/23 1150  TempSrc:   PainSc: 0-No pain                 Rhenda [place] L Jaziah Kwasnik

## 2023-10-21 NOTE — Op Note (Signed)
[facility] Patient Name: Monica [name] Procedure Date: 10/21/2023 11:03 AM MRN: [id number] Date of Birth: 05/22/1971 Attending MD: Hennie Duos. Marletta Lor , [place], [id number] CSN: [id number] Age: 52 Admit Type: Outpatient Procedure:                Colonoscopy Indications:              Screening for colorectal malignant neoplasm Providers:                Hennie Duos. Marletta Lor, DO, Crystal Page, Francoise Ceo                            RN, RN, Barkley Bruns L. Jessee Avers, Technician Referring MD:              Medicines:                See the Anesthesia note for documentation of the                            administered medications Complications:            No immediate complications. Estimated Blood Loss:     Estimated blood loss was minimal. Procedure:                Pre-Anesthesia Assessment:                           - The anesthesia plan was to use monitored                            anesthesia care (MAC).                           After obtaining informed consent, the colonoscope                            was passed under direct vision. Throughout the                            procedure, the patient's blood pressure, pulse, and                            oxygen saturations were monitored continuously. The                            [id number] ([id number]) was introduced through the                            anus and advanced to the the cecum, identified by                            appendiceal orifice and ileocecal valve. The                            colonoscopy was performed without difficulty. The                            patient  tolerated the procedure well. The quality                            of the bowel preparation was evaluated using the                            BBPS [facility] Bowel Preparation Scale) with scores                            of: Right Colon = 2 (minor amount of residual                            staining, small fragments of stool and/or opaque                             liquid, but mucosa seen well), Transverse Colon = 2                            (minor amount of residual staining, small fragments                            of stool and/or opaque liquid, but mucosa seen                            well) and Left Colon = 3 (entire mucosa seen well                            with no residual staining, small fragments of stool                            or opaque liquid). The total BBPS score equals 7.                            The quality of the bowel preparation was good. Scope In: 11:15:18 AM Scope Out: 11:46:18 AM Scope Withdrawal Time: 0 hours 28 minutes 29 seconds  Total Procedure Duration: 0 hours 31 minutes 0 seconds  Findings:      A 6 mm polyp was found in the descending colon. The polyp was sessile.       The polyp was removed with a cold snare. Resection and retrieval were       complete.      A 15-18 mm polyp was found in the sigmoid colon. The polyp was       pedunculated. The polyp was removed with a hot snare. Resection and       retrieval were complete.      A 10 mm polyp was found in the sigmoid colon. The polyp was       pedunculated. The polyp was removed with a cold snare. Resection and       retrieval were complete.      Four sessile polyps were found in the sigmoid colon. The polyps were 4       to 5 mm in size. These polyps were removed with a cold snare. Resection       and  retrieval were complete. Impression:               - One 6 mm polyp in the descending colon, removed                            with a cold snare. Resected and retrieved.                           - One 15 mm polyp in the sigmoid colon, removed                            with a hot snare. Resected and retrieved.                           - One 10 mm polyp in the sigmoid colon, removed                            with a cold snare. Resected and retrieved.                           - Four 4 to 5 mm polyps in the sigmoid colon,                             removed with a cold snare. Resected and retrieved. Moderate Sedation:      Per Anesthesia Care Recommendation:           - Patient has a contact number available for                            emergencies. The signs and symptoms of potential                            delayed complications were discussed with the                            patient. Return to normal activities tomorrow.                            Written discharge instructions were provided to the                            patient.                           - Resume previous diet.                           - Continue present medications.                           - Await pathology results.                           - Repeat colonoscopy in 3 years for surveillance.                           -  Return to GI clinic PRN. Procedure Code(s):        --- Professional ---                           [phone], Colonoscopy, flexible; with removal of                            tumor(s), polyp(s), or other lesion(s) by snare                            technique Diagnosis Code(s):        --- Professional ---                           Z12.11, Encounter for screening for malignant                            neoplasm of colon                           D12.4, Benign neoplasm of descending colon                           D12.5, Benign neoplasm of sigmoid colon CPT copyright 2022 American Medical Association. All rights reserved. The codes documented in this report are preliminary and upon coder review may  be revised to meet current compliance requirements. Hennie Duos. Marletta Lor, DO Hennie Duos. Marletta Lor, DO 10/21/2023 11:49:09 AM This report has been signed electronically. Number of Addenda: 0

## 2023-10-24 ENCOUNTER — Telehealth: Payer: Self-pay

## 2023-10-24 LAB — SURGICAL PATHOLOGY

## 2023-10-24 NOTE — Therapy (Signed)
OUTPATIENT PHYSICAL THERAPY CERVICAL TREATMENT   Patient Name: Monica [name] MRN: [id number] DOB:12-29-1970, 52 y.o., female Today's Date: 10/25/2023  END OF SESSION:  PT End of Session - 10/25/23 0847     Visit Number 3    Number of Visits 12    Date for PT Re-Evaluation 11/23/23    Authorization Type Medicare Part A & B    PT Start Time 0847    PT Stop Time 0926    PT Time Calculation (min) 39 min    Activity Tolerance Patient tolerated treatment well    Behavior During Therapy [facility] for tasks assessed/performed               Past Medical History:  Diagnosis Date   Arthritis    Asthma    Back pain    Breast cancer (HCC)    Buttock pain    Dyspnea    on exertion   Family history of breast cancer 09/22/2020   Hypertension    Multiple lung nodules on CT 2022   Personal history of chemotherapy    Personal history of radiation therapy    Past Surgical History:  Procedure Laterality Date   ABDOMINAL HYSTERECTOMY     APPENDECTOMY     BREAST LUMPECTOMY WITH RADIOACTIVE SEED AND SENTINEL LYMPH NODE BIOPSY Right 09/10/2020   Procedure: RIGHT BREAST LUMPECTOMY WITH RADIOACTIVE SEED AND SENTINEL LYMPH NODE MAPPING;  Surgeon: Harriette Bouillon, MD;  Location: [facility];  Service: General;  Laterality: Right;   PORTACATH PLACEMENT Right 10/16/2020   Procedure: INSERTION PORT-A-CATH WITH ULTRASOUND GUIDANCE;  Surgeon: Harriette Bouillon, MD;  Location: [facility];  Service: General;  Laterality: Right;   RE-EXCISION OF BREAST LUMPECTOMY Right 10/16/2020   Procedure: RE-EXCISION OF RIGHT BREAST LUMPECTOMY;  Surgeon: Harriette Bouillon, MD;  Location: [facility];  Service: General;  Laterality: Right;   REVERSE SHOULDER ARTHROPLASTY Left 11/16/2022   Procedure: LEFT REVERSE SHOULDER ARTHROPLASTY;  Surgeon: Huel Cote, MD;  Location: [facility];  Service: Orthopedics;  Laterality: Left;   Patient Active Problem  List   Diagnosis Date Noted   Obesity (BMI 30-39.9) 10/01/2023   Cervical cancer screening 09/15/2023   Moderate persistent asthma 08/31/2023   Hypertension 08/31/2023   Difficulty sleeping 07/31/2023   Depression, recurrent (HCC) 03/24/2023   Cervical radiculopathy 03/24/2023   Primary osteoarthritis, left shoulder 11/16/2022   Spondylolisthesis at L5-S1 level 01/04/2022   Lumbar radiculopathy 01/04/2022   Trochanteric bursitis of left hip 11/10/2021   Muscle cramp, nocturnal 11/10/2021   Port-A-Cath in place 12/29/2020   Genetic testing 10/08/2020   Family history of breast cancer 09/22/2020   Primary malignant neoplasm of upper outer quadrant of female breast, right (HCC) 08/27/2020    PCP: Gilmore Laroche, FNP  REFERRING PROVIDER: Angelina Sheriff, DO  REFERRING DIAG:  [phone] (ICD-10-CM) - Cervical radiculopathy M19.012 (ICD-10-CM) - Primary osteoarthritis, left shoulder  THERAPY DIAG:  Cervical spondylosis with radiculopathy  Muscle weakness (generalized)  Stiffness of left shoulder, not elsewhere classified  Other symptoms and signs involving the musculoskeletal system  Rationale for Evaluation and Treatment: Rehabilitation  ONSET DATE: 11/2022   SUBJECTIVE:  SUBJECTIVE STATEMENT:  Patient reports that the neck pain began after L TSA in 11/2022. Patient reports shooting, numbness, tingling in L hand. Difficulty with gripping and tends to drop objects at times. Has only been taking her Lyrica as needed rather than per MD note "twice daily". Difficulty lifting, gripping, reaching. Has aide that assists with cleaning, laundry, cooking, and all tasks that require lifting.  Hand dominance: Right  PERTINENT HISTORY:  Had EMG with Dr. Shearon Stalls 9/23; results = L C6  radiculopathy and myopathic changes in L deltoid muscle.  Known to this clinic L TSA earlier this year and received OT for this.   PAIN:  Are you having pain? Yes: NPRS scale: 10/10 Pain location: neck, L arm  Pain description: shooting, numbness, tingling, achy Aggravating factors: laying on L side Relieving factors: pain medication occasionally (Lyrica), heat, stress ball   PRECAUTIONS: None  RED FLAGS: None     WEIGHT BEARING RESTRICTIONS: No  FALLS:  Has patient fallen in last 6 months? No  LIVING ENVIRONMENT: Lives with: lives alone Lives in: House/apartment Stairs: No Has following equipment at home: None  OCCUPATION: disabled  PLOF: Needs assistance with homemaking - has an aide that helps with cleaning  PATIENT GOALS: to reduce pain  NEXT MD VISIT: 12/28/23  OBJECTIVE:  Note: Objective measures were completed at Evaluation unless otherwise noted.  DIAGNOSTIC FINDINGS:  EMG with Dr. Shearon Stalls 9/23; results = L C6 radiculopathy and myopathic changes in L deltoid muscle.   PATIENT SURVEYS:  NDI 62%  COGNITION: Overall cognitive status: Within functional limits for tasks assessed  SENSATION: Light touch: Impaired - decreased sensation in L forearm  POSTURE: rounded shoulders and forward head  PALPATION: Significant tightness noted to B UT with TTP to L UT   CERVICAL ROM:   Active ROM A/PROM (deg) eval  Flexion 50%  Extension 50%  Right lateral flexion 25%  Left lateral flexion 25%  Right rotation 25%  Left rotation 25%    (Blank rows = not tested)  UPPER EXTREMITY ROM:  Active ROM Right eval Left eval  Shoulder flexion  Limited ~90 degrees   Shoulder extension    Shoulder abduction    Shoulder adduction    Shoulder extension    Shoulder internal rotation    Shoulder external rotation    Elbow flexion    Elbow extension    Wrist flexion    Wrist extension    Wrist ulnar deviation    Wrist radial deviation    Wrist pronation    Wrist  supination     (Blank rows = not tested)  UPPER EXTREMITY MMT:  Unable to tolerate due to significant pain on evaluation on eval   MMT Right 10/17/23 Left 10/17/23  Shoulder flexion 3-* 3-*  Shoulder extension    Shoulder abduction 2+* 2+*  Shoulder adduction    Shoulder extension    Shoulder internal rotation 3-* 3-*  Shoulder external rotation 3-* 3-*  Middle trapezius    Lower trapezius    Elbow flexion 3-* 3-*  Elbow extension 3-* 3-*  Wrist flexion 2+* 2+*  Wrist extension 2+* 2+*  Wrist ulnar deviation    Wrist radial deviation    Wrist pronation    Wrist supination    Grip strength     (Blank rows = not tested)  CERVICAL SPECIAL TESTS:  Unable to tolerate 2/2 significant pain on evaluation  FUNCTIONAL TESTS:  Cervical endurance test: to be assessed next visit   TODAY'S TREATMENT:  DATE: 10/25/23    Subjective: Patient reports 0/10 pain this AM. Has been completing her stretches and seems to be helping.   Cervical endurance test: 10.20 seconds   Manual: STM to B UT, levator, and suboccipitals x 10 minutes Suboccipital release 2-3 x 20 seconds L/R UT stretch with GHJ overpressure 3 x 30 seconds each side   TE:  Shoulder horizontal abduction with 2# DB 2 x 10  Standing rows with GTB 2 x 10 - cues for relaxed shoulders and scapular squeeze Standing shoulder extension with GTB 2 x 10 Standing shoulder flexion with RTB 2 x 10  UBE level 1 x 2 minutes - unable to tolerate 2/2 discomfort   PATIENT EDUCATION:  Education details: HEP, POC, goals Person educated: Patient  Education method: Explanation, Demonstration, and Handouts Education comprehension: verbalized understanding  HOME EXERCISE PROGRAM: Access Code: [email or web address] URL: [email or web address] Date: 10/12/2023 Prepared by: Maylon Peppers  Exercises - Seated Upper  Trapezius Stretch  - 2-3 x daily - 5-7 x weekly - 5 reps - 30 second  hold - Seated Assisted Cervical Rotation with Towel  - 2-3 x daily - 5-7 x weekly - 10 reps - 10-15 hold - Seated Shoulder Rolls  - 2-3 x daily - 5-7 x weekly - 10 reps   ASSESSMENT:  CLINICAL IMPRESSION:    Patient arrives to treatment session with reports of 0/10 pain. Session focused on manual STM and stretching focused on B UT and suboccipitals with trigger point release in L UT after 30 seconds of pressure. Introduced scapular and shoulder strengthening with fair tolerance. Patient will benefit from skilled PT interventions to address listed impairments to improve QOL and reduce pain.  OBJECTIVE IMPAIRMENTS: decreased activity tolerance, decreased endurance, decreased mobility, decreased ROM, decreased strength, hypomobility, impaired flexibility, impaired sensation, impaired UE functional use, postural dysfunction, and pain.   ACTIVITY LIMITATIONS: carrying, lifting, and reach over head  PARTICIPATION LIMITATIONS: meal prep, cleaning, laundry, driving, community activity, and occupation  PERSONAL FACTORS: Age, Behavior pattern, Past/current experiences, and Time since onset of injury/illness/exacerbation are also affecting patient's functional outcome.   REHAB POTENTIAL: Fair    CLINICAL DECISION MAKING: Stable/uncomplicated  EVALUATION COMPLEXITY: Low   GOALS: Goals reviewed with patient? Yes  SHORT TERM GOALS: Target date: 11/01/2023  Patient will be independent in HEP to improve strength/mobility for better functional independence with ADLs. Baseline: 11/6: HEP initiated Goal status: INITIAL  LONG TERM GOALS: Target date: 11/22/2023  Patient will reduce Neck Disability Index score to <30% to demonstrate minimal disability with ADL's including improved sleeping tolerance, sitting tolerance, etc for better mobility at home and work. Baseline: 11/6: to be given visit #2; 11/11: 62% Goal status:  INITIAL  2.  Patient will report a worst pain of 5/10 on NRPS in neck to improve tolerance with ADLs and reduced symptoms with activities.  Baseline: 11/6: 9/10  Goal status: INITIAL  3.  Patient will improve B UE strength by 1/2 MMT grade to demonstrate improved functional UE strength to be able to complete ADLs and few iADLs without assistance.  Baseline: 11/6: MMT to assessed at visit #2 11/11: see above  Goal status: INITIAL  4.  Patient will improve cervical flexor endurance test by >15 seconds to demonstrate improved cervical strength without reports of increased pain/symptoms.  Baseline: 11/19: 10.20 seconds  Goal status: INITIAL   PLAN:  PT FREQUENCY: 1-2x/week  PT DURATION: 6 weeks  PLANNED INTERVENTIONS: 97164- PT Re-evaluation, 97110-Therapeutic exercises, 97530- Therapeutic activity, [id number]- Neuromuscular re-education, 97535-  Self Care, 16109- Manual therapy, 97014- Electrical stimulation (unattended), [phone]- Traction (mechanical), Patient/Family education, Dry Needling, Spinal manipulation, Spinal mobilization, Cryotherapy, and Moist heat  PLAN FOR NEXT SESSION: manual therapy and light scapular strengthening    Viviann Spare, PT, DPT  10/25/2023, 8:47 AM

## 2023-10-24 NOTE — Telephone Encounter (Signed)
Please advice  

## 2023-10-24 NOTE — Telephone Encounter (Signed)
Kindly encourage the pt to follow up with GI

## 2023-10-25 ENCOUNTER — Encounter (HOSPITAL_COMMUNITY): Payer: Self-pay

## 2023-10-25 ENCOUNTER — Ambulatory Visit (HOSPITAL_COMMUNITY): Payer: Medicare Other

## 2023-10-25 DIAGNOSIS — R29898 Other symptoms and signs involving the musculoskeletal system: Secondary | ICD-10-CM

## 2023-10-25 DIAGNOSIS — M25612 Stiffness of left shoulder, not elsewhere classified: Secondary | ICD-10-CM

## 2023-10-25 DIAGNOSIS — M4722 Other spondylosis with radiculopathy, cervical region: Secondary | ICD-10-CM | POA: Diagnosis not present

## 2023-10-25 DIAGNOSIS — M6281 Muscle weakness (generalized): Secondary | ICD-10-CM

## 2023-10-25 NOTE — Telephone Encounter (Signed)
Spoke with pt advised her to call [place] office.

## 2023-10-26 ENCOUNTER — Encounter: Payer: Self-pay | Admitting: Hematology and Oncology

## 2023-10-26 NOTE — Telephone Encounter (Signed)
Telephone call  

## 2023-10-27 ENCOUNTER — Encounter (HOSPITAL_COMMUNITY): Payer: Self-pay | Admitting: Internal Medicine

## 2023-10-31 NOTE — Therapy (Signed)
OUTPATIENT PHYSICAL THERAPY CERVICAL TREATMENT   Patient Name: Monica [name] MRN: [id number] DOB:02/20/1971, 52 y.o., female Today's Date: 11/01/2023  END OF SESSION:  PT End of Session - 11/01/23 0846     Visit Number 4    Number of Visits 12    Date for PT Re-Evaluation 11/23/23    Authorization Type Medicare Part A & B    PT Start Time 0846    PT Stop Time 0927    PT Time Calculation (min) 41 min    Activity Tolerance Patient tolerated treatment well    Behavior During Therapy [facility] for tasks assessed/performed                Past Medical History:  Diagnosis Date   Arthritis    Asthma    Back pain    Breast cancer (HCC)    Buttock pain    Dyspnea    on exertion   Family history of breast cancer 09/22/2020   Hypertension    Multiple lung nodules on CT 2022   Personal history of chemotherapy    Personal history of radiation therapy    Past Surgical History:  Procedure Laterality Date   ABDOMINAL HYSTERECTOMY     APPENDECTOMY     BREAST LUMPECTOMY WITH RADIOACTIVE SEED AND SENTINEL LYMPH NODE BIOPSY Right 09/10/2020   Procedure: RIGHT BREAST LUMPECTOMY WITH RADIOACTIVE SEED AND SENTINEL LYMPH NODE MAPPING;  Surgeon: Harriette Bouillon, MD;  Location: [facility];  Service: General;  Laterality: Right;   COLONOSCOPY WITH PROPOFOL N/A 10/21/2023   Procedure: COLONOSCOPY WITH PROPOFOL;  Surgeon: Lanelle Bal, DO;  [facility];  Service: Endoscopy;  Laterality: N/A;  11:00am, asa 3   POLYPECTOMY  10/21/2023   Procedure: POLYPECTOMY INTESTINAL;  Surgeon: Lanelle Bal, DO;  [facility];  Service: Endoscopy;;   PORTACATH PLACEMENT Right 10/16/2020   Procedure: INSERTION PORT-A-CATH WITH ULTRASOUND GUIDANCE;  Surgeon: Harriette Bouillon, MD;  Location: [facility];  Service: General;  Laterality: Right;   RE-EXCISION OF BREAST LUMPECTOMY Right 10/16/2020   Procedure: RE-EXCISION OF RIGHT BREAST  LUMPECTOMY;  Surgeon: Harriette Bouillon, MD;  Location: [facility];  Service: General;  Laterality: Right;   REVERSE SHOULDER ARTHROPLASTY Left 11/16/2022   Procedure: LEFT REVERSE SHOULDER ARTHROPLASTY;  Surgeon: Huel Cote, MD;  Location: [facility];  Service: Orthopedics;  Laterality: Left;   Patient Active Problem List   Diagnosis Date Noted   Obesity (BMI 30-39.9) 10/01/2023   Cervical cancer screening 09/15/2023   Moderate persistent asthma 08/31/2023   Hypertension 08/31/2023   Difficulty sleeping 07/31/2023   Depression, recurrent (HCC) 03/24/2023   Cervical radiculopathy 03/24/2023   Primary osteoarthritis, left shoulder 11/16/2022   Spondylolisthesis at L5-S1 level 01/04/2022   Lumbar radiculopathy 01/04/2022   Trochanteric bursitis of left hip 11/10/2021   Muscle cramp, nocturnal 11/10/2021   Port-A-Cath in place 12/29/2020   Genetic testing 10/08/2020   Family history of breast cancer 09/22/2020   Primary malignant neoplasm of upper outer quadrant of female breast, right (HCC) 08/27/2020    PCP: Gilmore Laroche, FNP  REFERRING PROVIDER: Angelina Sheriff, DO  REFERRING DIAG:  [phone] (ICD-10-CM) - Cervical radiculopathy M19.012 (ICD-10-CM) - Primary osteoarthritis, left shoulder  THERAPY DIAG:  Cervical spondylosis with radiculopathy  Muscle weakness (generalized)  Rationale for Evaluation and Treatment: Rehabilitation  ONSET DATE: 11/2022   SUBJECTIVE:  SUBJECTIVE STATEMENT:  Patient reports that the neck pain began after L TSA in 11/2022. Patient reports shooting, numbness, tingling in L hand. Difficulty with gripping and tends to drop objects at times. Has only been taking her Lyrica as needed rather than per MD note "twice daily".  Difficulty lifting, gripping, reaching. Has aide that assists with cleaning, laundry, cooking, and all tasks that require lifting.  Hand dominance: Right  PERTINENT HISTORY:  Had EMG with Dr. Shearon Stalls 9/23; results = L C6 radiculopathy and myopathic changes in L deltoid muscle.  Known to this clinic L TSA earlier this year and received OT for this.   PAIN:  Are you having pain? Yes: NPRS scale: 10/10 Pain location: neck, L arm  Pain description: shooting, numbness, tingling, achy Aggravating factors: laying on L side Relieving factors: pain medication occasionally (Lyrica), heat, stress ball   PRECAUTIONS: None  RED FLAGS: None     WEIGHT BEARING RESTRICTIONS: No  FALLS:  Has patient fallen in last 6 months? No  LIVING ENVIRONMENT: Lives with: lives alone Lives in: House/apartment Stairs: No Has following equipment at home: None  OCCUPATION: disabled  PLOF: Needs assistance with homemaking - has an aide that helps with cleaning  PATIENT GOALS: to reduce pain  NEXT MD VISIT: 12/28/23  OBJECTIVE:  Note: Objective measures were completed at Evaluation unless otherwise noted.  DIAGNOSTIC FINDINGS:  EMG with Dr. Shearon Stalls 9/23; results = L C6 radiculopathy and myopathic changes in L deltoid muscle.   PATIENT SURVEYS:  NDI 62%  COGNITION: Overall cognitive status: Within functional limits for tasks assessed  SENSATION: Light touch: Impaired - decreased sensation in L forearm  POSTURE: rounded shoulders and forward head  PALPATION: Significant tightness noted to B UT with TTP to L UT   CERVICAL ROM:   Active ROM A/PROM (deg) eval  Flexion 50%  Extension 50%  Right lateral flexion 25%  Left lateral flexion 25%  Right rotation 25%  Left rotation 25%    (Blank rows = not tested)  UPPER EXTREMITY ROM:  Active ROM Right eval Left eval  Shoulder flexion  Limited ~90 degrees   Shoulder extension    Shoulder abduction    Shoulder adduction    Shoulder  extension    Shoulder internal rotation    Shoulder external rotation    Elbow flexion    Elbow extension    Wrist flexion    Wrist extension    Wrist ulnar deviation    Wrist radial deviation    Wrist pronation    Wrist supination     (Blank rows = not tested)  UPPER EXTREMITY MMT:  Unable to tolerate due to significant pain on evaluation on eval   MMT Right 10/17/23 Left 10/17/23  Shoulder flexion 3-* 3-*  Shoulder extension    Shoulder abduction 2+* 2+*  Shoulder adduction    Shoulder extension    Shoulder internal rotation 3-* 3-*  Shoulder external rotation 3-* 3-*  Middle trapezius    Lower trapezius    Elbow flexion 3-* 3-*  Elbow extension 3-* 3-*  Wrist flexion 2+* 2+*  Wrist extension 2+* 2+*  Wrist ulnar deviation    Wrist radial deviation    Wrist pronation    Wrist supination    Grip strength     (Blank rows = not tested)  CERVICAL SPECIAL TESTS:  Unable to tolerate 2/2 significant pain on evaluation  FUNCTIONAL TESTS:  Cervical endurance test: to be assessed next visit   TODAY'S TREATMENT:  DATE: 11/01/23     Subjective: Patient reports feeling stiff this AM due to rainy weather. Has been completing exercises daily.    Manual: STM to B UT, levator, and suboccipitals x 10 minutes Suboccipital release 2-3 x 20 seconds L/R UT stretch with GHJ overpressure 3 x 30 seconds each side   TE:  Shoulder horizontal abduction with RTB (loops tied to end due to patient's limited dexterity) x 12  Standing rows with GTB  2 x 10 (loops tied to end due to patient's limited dexterity) Standing shoulder extension with GTB x 10  Standing shoulder flexion with GTB x 10  Standing bicep curls with GTB x 10   Provided patient with GTB and RTB with loops tied at end for new HEP, see below  PATIENT EDUCATION:  Education details: HEP, POC,  goals Person educated: Patient  Education method: Explanation, Demonstration, and Handouts Education comprehension: verbalized understanding  HOME EXERCISE PROGRAM: Access Code: [email or web address] URL: [email or web address] Date: 10/12/2023 Prepared by: Maylon Peppers  Exercises - Seated Upper Trapezius Stretch  - 2-3 x daily - 5-7 x weekly - 5 reps - 30 second  hold - Seated Assisted Cervical Rotation with Towel  - 2-3 x daily - 5-7 x weekly - 10 reps - 10-15 hold - Seated Shoulder Rolls  - 2-3 x daily - 5-7 x weekly - 10 reps  Access Code: [facility] URL: [email or web address] Date: 11/01/2023 Prepared by: Maylon Peppers  Exercises - Standing Shoulder Row with Anchored Resistance  - 2-3 x daily - 5-7 x weekly - 3 sets - 10 reps - Shoulder extension with resistance - Neutral  - 2-3 x daily - 5-7 x weekly - 3 sets - 10 reps - Standing Shoulder Horizontal Abduction with Resistance  - 2-3 x daily - 5-7 x weekly - 3 sets - 10 reps - Shoulder Flexion with Anterior Anchored Resistance  - 2-3 x daily - 5-7 x weekly - 3 sets - 10 reps - Seated Single Arm Elbow Flexion with Resistance  - 2-3 x daily - 5-7 x weekly - 3 sets - 10 reps  ASSESSMENT:  CLINICAL IMPRESSION:     Patient arrives to treatment session motivated to participate with reports of stiffness in her neck. Session focused on manual stretching and STM as well as scapular and shoulder strengthening. Provided patient with GTB and RTB for HEP with loops tied at end due to patient's limited dexterity. Patient will benefit from skilled PT interventions to address listed impairments to improve QOL and reduce pain.  OBJECTIVE IMPAIRMENTS: decreased activity tolerance, decreased endurance, decreased mobility, decreased ROM, decreased strength, hypomobility, impaired flexibility, impaired sensation, impaired UE functional use, postural dysfunction, and pain.   ACTIVITY LIMITATIONS: carrying, lifting, and reach over  head  PARTICIPATION LIMITATIONS: meal prep, cleaning, laundry, driving, community activity, and occupation  PERSONAL FACTORS: Age, Behavior pattern, Past/current experiences, and Time since onset of injury/illness/exacerbation are also affecting patient's functional outcome.   REHAB POTENTIAL: Fair    CLINICAL DECISION MAKING: Stable/uncomplicated  EVALUATION COMPLEXITY: Low   GOALS: Goals reviewed with patient? Yes  SHORT TERM GOALS: Target date: 11/01/2023  Patient will be independent in HEP to improve strength/mobility for better functional independence with ADLs. Baseline: 11/6: HEP initiated Goal status: INITIAL  LONG TERM GOALS: Target date: 11/22/2023  Patient will reduce Neck Disability Index score to <30% to demonstrate minimal disability with ADL's including improved sleeping tolerance, sitting tolerance, etc for better mobility at home and work. Baseline: 11/6: to be given visit #2;  11/11: 62% Goal status: INITIAL  2.  Patient will report a worst pain of 5/10 on NRPS in neck to improve tolerance with ADLs and reduced symptoms with activities.  Baseline: 11/6: 9/10  Goal status: INITIAL  3.  Patient will improve B UE strength by 1/2 MMT grade to demonstrate improved functional UE strength to be able to complete ADLs and few iADLs without assistance.  Baseline: 11/6: MMT to assessed at visit #2 11/11: see above  Goal status: INITIAL  4.  Patient will improve cervical flexor endurance test by >15 seconds to demonstrate improved cervical strength without reports of increased pain/symptoms.  Baseline: 11/19: 10.20 seconds  Goal status: INITIAL   PLAN:  PT FREQUENCY: 1-2x/week  PT DURATION: 6 weeks  PLANNED INTERVENTIONS: 97164- PT Re-evaluation, 97110-Therapeutic exercises, 97530- Therapeutic activity, 97112- Neuromuscular re-education, 97535- Self Care, 16109- Manual therapy, 97014- Electrical stimulation (unattended), 97012- Traction (mechanical), Patient/Family  education, Dry Needling, Spinal manipulation, Spinal mobilization, Cryotherapy, and Moist heat  PLAN FOR NEXT SESSION: manual therapy and light scapular strengthening    Viviann Spare, PT, DPT  11/01/2023, 8:46 AM

## 2023-11-01 ENCOUNTER — Ambulatory Visit (HOSPITAL_COMMUNITY): Payer: Medicare Other

## 2023-11-01 ENCOUNTER — Encounter (HOSPITAL_COMMUNITY): Payer: Self-pay

## 2023-11-01 DIAGNOSIS — M6281 Muscle weakness (generalized): Secondary | ICD-10-CM

## 2023-11-01 DIAGNOSIS — M4722 Other spondylosis with radiculopathy, cervical region: Secondary | ICD-10-CM | POA: Diagnosis not present

## 2023-11-09 ENCOUNTER — Ambulatory Visit (HOSPITAL_COMMUNITY): Payer: Medicare Other | Attending: Physical Medicine and Rehabilitation

## 2023-11-09 DIAGNOSIS — M6281 Muscle weakness (generalized): Secondary | ICD-10-CM | POA: Insufficient documentation

## 2023-11-09 DIAGNOSIS — M25612 Stiffness of left shoulder, not elsewhere classified: Secondary | ICD-10-CM | POA: Insufficient documentation

## 2023-11-09 DIAGNOSIS — M4722 Other spondylosis with radiculopathy, cervical region: Secondary | ICD-10-CM | POA: Insufficient documentation

## 2023-11-09 NOTE — Therapy (Addendum)
OUTPATIENT PHYSICAL THERAPY CERVICAL TREATMENT   Patient Name: Monica [name] MRN: [id number] DOB:08-May-1971, 52 y.o., female Today's Date: 11/09/2023  END OF SESSION:  PT End of Session - 11/09/23 0845     Visit Number 5    Number of Visits 12    Date for PT Re-Evaluation 11/23/23    Authorization Type Medicare Part A & B    PT Start Time 0845    PT Stop Time 0930    PT Time Calculation (min) 45 min    Activity Tolerance Patient tolerated treatment well    Behavior During Therapy [facility] for tasks assessed/performed              Past Medical History:  Diagnosis Date   Arthritis    Asthma    Back pain    Breast cancer (HCC)    Buttock pain    Dyspnea    on exertion   Family history of breast cancer 09/22/2020   Hypertension    Multiple lung nodules on CT 2022   Personal history of chemotherapy    Personal history of radiation therapy    Past Surgical History:  Procedure Laterality Date   ABDOMINAL HYSTERECTOMY     APPENDECTOMY     BREAST LUMPECTOMY WITH RADIOACTIVE SEED AND SENTINEL LYMPH NODE BIOPSY Right 09/10/2020   Procedure: RIGHT BREAST LUMPECTOMY WITH RADIOACTIVE SEED AND SENTINEL LYMPH NODE MAPPING;  Surgeon: Harriette Bouillon, MD;  Location: [facility];  Service: General;  Laterality: Right;   COLONOSCOPY WITH PROPOFOL N/A 10/21/2023   Procedure: COLONOSCOPY WITH PROPOFOL;  Surgeon: Lanelle Bal, DO;  [facility];  Service: Endoscopy;  Laterality: N/A;  11:00am, asa 3   POLYPECTOMY  10/21/2023   Procedure: POLYPECTOMY INTESTINAL;  Surgeon: Lanelle Bal, DO;  [facility];  Service: Endoscopy;;   PORTACATH PLACEMENT Right 10/16/2020   Procedure: INSERTION PORT-A-CATH WITH ULTRASOUND GUIDANCE;  Surgeon: Harriette Bouillon, MD;  Location: [facility];  Service: General;  Laterality: Right;   RE-EXCISION OF BREAST LUMPECTOMY Right 10/16/2020   Procedure: RE-EXCISION OF RIGHT BREAST LUMPECTOMY;   Surgeon: Harriette Bouillon, MD;  Location: [facility];  Service: General;  Laterality: Right;   REVERSE SHOULDER ARTHROPLASTY Left 11/16/2022   Procedure: LEFT REVERSE SHOULDER ARTHROPLASTY;  Surgeon: Huel Cote, MD;  Location: [facility];  Service: Orthopedics;  Laterality: Left;   Patient Active Problem List   Diagnosis Date Noted   Obesity (BMI 30-39.9) 10/01/2023   Cervical cancer screening 09/15/2023   Moderate persistent asthma 08/31/2023   Hypertension 08/31/2023   Difficulty sleeping 07/31/2023   Depression, recurrent (HCC) 03/24/2023   Cervical radiculopathy 03/24/2023   Primary osteoarthritis, left shoulder 11/16/2022   Spondylolisthesis at L5-S1 level 01/04/2022   Lumbar radiculopathy 01/04/2022   Trochanteric bursitis of left hip 11/10/2021   Muscle cramp, nocturnal 11/10/2021   Port-A-Cath in place 12/29/2020   Genetic testing 10/08/2020   Family history of breast cancer 09/22/2020   Primary malignant neoplasm of upper outer quadrant of female breast, right (HCC) 08/27/2020    PCP: Gilmore Laroche, FNP  REFERRING PROVIDER: Angelina Sheriff, DO  REFERRING DIAG:  [phone] (ICD-10-CM) - Cervical radiculopathy M19.012 (ICD-10-CM) - Primary osteoarthritis, left shoulder  THERAPY DIAG:  Cervical spondylosis with radiculopathy  Muscle weakness (generalized)  Rationale for Evaluation and Treatment: Rehabilitation  ONSET DATE: 11/2022   SUBJECTIVE:  SUBJECTIVE STATEMENT:  Patient reports feeling a little stiff today. Still doing her HEP. Seeing surgeon on 11/16/23.  Eval: Patient reports that the neck pain began after L TSA in 11/2022. Patient reports shooting, numbness, tingling in L hand. Difficulty with gripping and tends to drop objects  at times. Has only been taking her Lyrica as needed rather than per MD note "twice daily". Difficulty lifting, gripping, reaching. Has aide that assists with cleaning, laundry, cooking, and all tasks that require lifting.  Hand dominance: Right  PERTINENT HISTORY:  Had EMG with Dr. Shearon Stalls 9/23; results = L C6 radiculopathy and myopathic changes in L deltoid muscle.  Known to this clinic L TSA earlier this year and received OT for this.   PAIN:  Are you having pain? Yes: NPRS scale: 10/10 Pain location: neck, L arm  Pain description: shooting, numbness, tingling, achy Aggravating factors: laying on L side Relieving factors: pain medication occasionally (Lyrica), heat, stress ball   PRECAUTIONS: None  RED FLAGS: None     WEIGHT BEARING RESTRICTIONS: No  FALLS:  Has patient fallen in last 6 months? No  LIVING ENVIRONMENT: Lives with: lives alone Lives in: House/apartment Stairs: No Has following equipment at home: None  OCCUPATION: disabled  PLOF: Needs assistance with homemaking - has an aide that helps with cleaning  PATIENT GOALS: to reduce pain  NEXT MD VISIT: 12/28/23  OBJECTIVE:  Note: Objective measures were completed at Evaluation unless otherwise noted.  DIAGNOSTIC FINDINGS:  EMG with Dr. Shearon Stalls 9/23; results = L C6 radiculopathy and myopathic changes in L deltoid muscle.   PATIENT SURVEYS:  NDI 62%  COGNITION: Overall cognitive status: Within functional limits for tasks assessed  SENSATION: Light touch: Impaired - decreased sensation in L forearm  POSTURE: rounded shoulders and forward head  PALPATION: Significant tightness noted to B UT with TTP to L UT   CERVICAL ROM:   Active ROM A/PROM (deg) eval  Flexion 50%  Extension 50%  Right lateral flexion 25%  Left lateral flexion 25%  Right rotation 25%  Left rotation 25%    (Blank rows = not tested)  UPPER EXTREMITY ROM:  Active ROM Right eval Left eval  Shoulder flexion  Limited ~90  degrees   Shoulder extension    Shoulder abduction    Shoulder adduction    Shoulder extension    Shoulder internal rotation    Shoulder external rotation    Elbow flexion    Elbow extension    Wrist flexion    Wrist extension    Wrist ulnar deviation    Wrist radial deviation    Wrist pronation    Wrist supination     (Blank rows = not tested)  UPPER EXTREMITY MMT:  Unable to tolerate due to significant pain on evaluation on eval   MMT Right 10/17/23 Left 10/17/23  Shoulder flexion 3-* 3-*  Shoulder extension    Shoulder abduction 2+* 2+*  Shoulder adduction    Shoulder extension    Shoulder internal rotation 3-* 3-*  Shoulder external rotation 3-* 3-*  Middle trapezius    Lower trapezius    Elbow flexion 3-* 3-*  Elbow extension 3-* 3-*  Wrist flexion 2+* 2+*  Wrist extension 2+* 2+*  Wrist ulnar deviation    Wrist radial deviation    Wrist pronation    Wrist supination    Grip strength     (Blank rows = not tested)  CERVICAL SPECIAL TESTS:  Unable to tolerate 2/2 significant pain on  evaluation  FUNCTIONAL TESTS:  Cervical endurance test: to be assessed next visit   TODAY'S TREATMENT:                                                                                                                              DATE: 11/09/23      11/09/23: Moist heat 5' Manual:  STM to B UT, levator, and suboccipitals x 10 minutes  Suboccipital release 2-3 x 20 seconds  L/R UT stretch with GHJ overpressure 3 x 30 seconds each side   TE:  Supine protraction/retraction 2 x 10; reach for wall then reach for sky  Cervical rotation with Dynadisc as pillow; breathing synced, x10 each direction   UE bike 2' forward, 2' backward   Standing shoulder extension with GTB x 10  Standing rows with GTB x 10    11/01/23: Manual: STM to B UT, levator, and suboccipitals x 10 minutes Suboccipital release 2-3 x 20 seconds L/R UT stretch with GHJ overpressure 3 x 30 seconds each side    TE:  Shoulder horizontal abduction with RTB (loops tied to end due to patient's limited dexterity) x 12  Standing rows with GTB  2 x 10 (loops tied to end due to patient's limited dexterity) Standing shoulder extension with GTB x 10  Standing shoulder flexion with GTB x 10  Standing bicep curls with GTB x 10   Provided patient with GTB and RTB with loops tied at end for new HEP, see below  PATIENT EDUCATION:  Education details: HEP, POC, goals Person educated: Patient  Education method: Explanation, Demonstration, and Handouts Education comprehension: verbalized understanding  HOME EXERCISE PROGRAM: Access Code: [email or web address] URL: [email or web address] Date: 10/12/2023 Prepared by: Maylon Peppers  Exercises - Seated Upper Trapezius Stretch  - 2-3 x daily - 5-7 x weekly - 5 reps - 30 second  hold - Seated Assisted Cervical Rotation with Towel  - 2-3 x daily - 5-7 x weekly - 10 reps - 10-15 hold - Seated Shoulder Rolls  - 2-3 x daily - 5-7 x weekly - 10 reps  Access Code: [facility] URL: [email or web address] Date: 11/01/2023 Prepared by: Maylon Peppers  Exercises - Standing Shoulder Row with Anchored Resistance  - 2-3 x daily - 5-7 x weekly - 3 sets - 10 reps - Shoulder extension with resistance - Neutral  - 2-3 x daily - 5-7 x weekly - 3 sets - 10 reps - Standing Shoulder Horizontal Abduction with Resistance  - 2-3 x daily - 5-7 x weekly - 3 sets - 10 reps - Shoulder Flexion with Anterior Anchored Resistance  - 2-3 x daily - 5-7 x weekly - 3 sets - 10 reps - Seated Single Arm Elbow Flexion with Resistance  - 2-3 x daily - 5-7 x weekly - 3 sets - 10 reps  ASSESSMENT:  CLINICAL IMPRESSION:     Patient arrives to treatment session motivated to participate with reports of stiffness in her neck and  shoulder. Session focused on manual stretching and STM as well as scapular and shoulder strengthening. Introduction of neck rotation AAROM in supine with an air  pillow resulted in noticeable ROM improvements immediately and throughout rest of session. Patient needed verbal and tactile cues as well as demonstration for proper form during retraction and protractions but was able to execute motions with good control after cuing. Able to use arm bike for the first time today. Noted shoulder fatigue after arm bike but patient maintained ROM improvements from beginning of session. Patient will benefit from skilled PT interventions to address listed impairments to improve QOL and reduce pain.  OBJECTIVE IMPAIRMENTS: decreased activity tolerance, decreased endurance, decreased mobility, decreased ROM, decreased strength, hypomobility, impaired flexibility, impaired sensation, impaired UE functional use, postural dysfunction, and pain.   ACTIVITY LIMITATIONS: carrying, lifting, and reach over head  PARTICIPATION LIMITATIONS: meal prep, cleaning, laundry, driving, community activity, and occupation  PERSONAL FACTORS: Age, Behavior pattern, Past/current experiences, and Time since onset of injury/illness/exacerbation are also affecting patient's functional outcome.   REHAB POTENTIAL: Fair    CLINICAL DECISION MAKING: Stable/uncomplicated  EVALUATION COMPLEXITY: Low   GOALS: Goals reviewed with patient? Yes  SHORT TERM GOALS: Target date: 11/01/2023  Patient will be independent in HEP to improve strength/mobility for better functional independence with ADLs. Baseline: 11/6: HEP initiated Goal status: MET  LONG TERM GOALS: Target date: 11/22/2023  Patient will reduce Neck Disability Index score to <30% to demonstrate minimal disability with ADL's including improved sleeping tolerance, sitting tolerance, etc for better mobility at home and work. Baseline: 11/6: to be given visit #2; 11/11: 62% Goal status: INITIAL  2.  Patient will report a worst pain of 5/10 on NRPS in neck to improve tolerance with ADLs and reduced symptoms with activities.  Baseline:  11/6: 9/10  Goal status: INITIAL  3.  Patient will improve B UE strength by 1/2 MMT grade to demonstrate improved functional UE strength to be able to complete ADLs and few iADLs without assistance.  Baseline: 11/6: MMT to assessed at visit #2 11/11: see above  Goal status: INITIAL  4.  Patient will improve cervical flexor endurance test by >15 seconds to demonstrate improved cervical strength without reports of increased pain/symptoms.  Baseline: 11/19: 10.20 seconds  Goal status: INITIAL   PLAN:  PT FREQUENCY: 1-2x/week  PT DURATION: 6 weeks  PLANNED INTERVENTIONS: 97164- PT Re-evaluation, 97110-Therapeutic exercises, 97530- Therapeutic activity, 97112- Neuromuscular re-education, 97535- Self Care, 16109- Manual therapy, 97014- Electrical stimulation (unattended), 97012- Traction (mechanical), Patient/Family education, Dry Needling, Spinal manipulation, Spinal mobilization, Cryotherapy, and Moist heat  PLAN FOR NEXT SESSION: manual therapy and light scapular strengthening   9:45 AM, 11/09/23 Karna Christmas, SPT  " I agree with the following treatment note after reviewing documentation. This session was performed under the supervision of a licensed clinician."  10:18 AM, 11/09/23 Amy Small Lynch MPT [facility] [place] [phone]

## 2023-11-15 ENCOUNTER — Ambulatory Visit (HOSPITAL_COMMUNITY): Payer: Medicare Other

## 2023-11-15 DIAGNOSIS — M4722 Other spondylosis with radiculopathy, cervical region: Secondary | ICD-10-CM | POA: Diagnosis present

## 2023-11-15 DIAGNOSIS — M6281 Muscle weakness (generalized): Secondary | ICD-10-CM | POA: Diagnosis present

## 2023-11-15 DIAGNOSIS — M25612 Stiffness of left shoulder, not elsewhere classified: Secondary | ICD-10-CM | POA: Diagnosis present

## 2023-11-15 NOTE — Therapy (Addendum)
OUTPATIENT PHYSICAL THERAPY CERVICAL TREATMENT   Patient Name: Monica [name] MRN: [id number] DOB:1971-05-13, 52 y.o., female Today's Date: 11/15/2023  END OF SESSION:  PT End of Session - 11/15/23 0842     Visit Number 6    Number of Visits 12    Date for PT Re-Evaluation 11/23/23    Authorization Type Medicare Part A & B    PT Start Time 0845    PT Stop Time 0930    PT Time Calculation (min) 45 min    Activity Tolerance Patient tolerated treatment well;No increased pain    Behavior During Therapy WFL for tasks assessed/performed             Past Medical History:  Diagnosis Date   Arthritis    Asthma    Back pain    Breast cancer (HCC)    Buttock pain    Dyspnea    on exertion   Family history of breast cancer 09/22/2020   Hypertension    Multiple lung nodules on CT 2022   Personal history of chemotherapy    Personal history of radiation therapy    Past Surgical History:  Procedure Laterality Date   ABDOMINAL HYSTERECTOMY     APPENDECTOMY     BREAST LUMPECTOMY WITH RADIOACTIVE SEED AND SENTINEL LYMPH NODE BIOPSY Right 09/10/2020   Procedure: RIGHT BREAST LUMPECTOMY WITH RADIOACTIVE SEED AND SENTINEL LYMPH NODE MAPPING;  Surgeon: Harriette Bouillon, MD;  Location:  [facility];  Service: General;  Laterality: Right;   COLONOSCOPY WITH PROPOFOL N/A 10/21/2023   Procedure: COLONOSCOPY WITH PROPOFOL;  Surgeon: Lanelle Bal, DO;  [facility]: [facility];  Service: Endoscopy;  Laterality: N/A;  11:00am, asa 3   POLYPECTOMY  10/21/2023   Procedure: POLYPECTOMY INTESTINAL;  Surgeon: Lanelle Bal, DO;  Location: [facility];  Service: Endoscopy;;   PORTACATH PLACEMENT Right 10/16/2020   Procedure: INSERTION PORT-A-CATH WITH ULTRASOUND GUIDANCE;  Surgeon: Harriette Bouillon, MD;  Location: [facility];  Service: General;  Laterality: Right;   RE-EXCISION OF BREAST LUMPECTOMY Right 10/16/2020   Procedure: RE-EXCISION OF RIGHT  BREAST LUMPECTOMY;  Surgeon: Harriette Bouillon, MD;  Location: [facility];  Service: General;  Laterality: Right;   REVERSE SHOULDER ARTHROPLASTY Left 11/16/2022   Procedure: LEFT REVERSE SHOULDER ARTHROPLASTY;  Surgeon: Huel Cote, MD;  Location: [facility];  Service: Orthopedics;  Laterality: Left;   Patient Active Problem List   Diagnosis Date Noted   Obesity (BMI 30-39.9) 10/01/2023   Cervical cancer screening 09/15/2023   Moderate persistent asthma 08/31/2023   Hypertension 08/31/2023   Difficulty sleeping 07/31/2023   Depression, recurrent (HCC) 03/24/2023   Cervical radiculopathy 03/24/2023   Primary osteoarthritis, left shoulder 11/16/2022   Spondylolisthesis at L5-S1 level 01/04/2022   Lumbar radiculopathy 01/04/2022   Trochanteric bursitis of left hip 11/10/2021   Muscle cramp, nocturnal 11/10/2021   Port-A-Cath in place 12/29/2020   Genetic testing 10/08/2020   Family history of breast cancer 09/22/2020   Primary malignant neoplasm of upper outer quadrant of female breast, right (HCC) 08/27/2020    PCP: Gilmore Laroche, FNP  REFERRING PROVIDER: Angelina Sheriff, DO  REFERRING DIAG:  [phone] (ICD-10-CM) - Cervical radiculopathy M19.012 (ICD-10-CM) - Primary osteoarthritis, left shoulder  THERAPY DIAG:  Stiffness of left shoulder, not elsewhere classified  Cervical spondylosis with radiculopathy  Muscle weakness (generalized)  Rationale for Evaluation and Treatment: Rehabilitation  ONSET DATE: 11/2022   SUBJECTIVE:  SUBJECTIVE STATEMENT:  Patient reports not feeling sore or any lingering pain after last session. Feeling stiff and achy today. Left neck and shoulder 9/10 pain to start session. Slept really poorly last night due to neck  and shoulder ache; kept waking up ever 1-2 hours. Still doing her HEP. Seeing surgeon on 11/16/23; wants to discuss possibly doing another "shot" in her neck.  Eval: Patient reports that the neck pain began after L TSA in 11/2022. Patient reports shooting, numbness, tingling in L hand. Difficulty with gripping and tends to drop objects at times. Has only been taking her Lyrica as needed rather than per MD note "twice daily". Difficulty lifting, gripping, reaching. Has aide that assists with cleaning, laundry, cooking, and all tasks that require lifting.  Hand dominance: Right  PERTINENT HISTORY:  Had EMG with Dr. Shearon Stalls 9/23; results = L C6 radiculopathy and myopathic changes in L deltoid muscle.  Known to this clinic L TSA earlier this year and received OT for this.   PAIN:  Are you having pain? Yes: NPRS scale: 10/10 Pain location: neck, L arm  Pain description: shooting, numbness, tingling, achy Aggravating factors: laying on L side Relieving factors: pain medication occasionally (Lyrica), heat, stress ball   PRECAUTIONS: None  RED FLAGS: None     WEIGHT BEARING RESTRICTIONS: No  FALLS:  Has patient fallen in last 6 months? No  LIVING ENVIRONMENT: Lives with: lives alone Lives in: House/apartment Stairs: No Has following equipment at home: None  OCCUPATION: disabled  PLOF: Needs assistance with homemaking - has an aide that helps with cleaning  PATIENT GOALS: to reduce pain  NEXT MD VISIT: 12/28/23  OBJECTIVE:  Note: Objective measures were completed at Evaluation unless otherwise noted.  DIAGNOSTIC FINDINGS:  EMG with Dr. Shearon Stalls 9/23; results = L C6 radiculopathy and myopathic changes in L deltoid muscle.   PATIENT SURVEYS:  NDI 62%  COGNITION: Overall cognitive status: Within functional limits for tasks assessed  SENSATION: Light touch: Impaired - decreased sensation in L forearm  POSTURE: rounded shoulders and forward head  PALPATION: Significant  tightness noted to B UT with TTP to L UT   CERVICAL ROM:   Active ROM A/PROM (deg) eval  Flexion 50%  Extension 50%  Right lateral flexion 25%  Left lateral flexion 25%  Right rotation 25%  Left rotation 25%    (Blank rows = not tested)  UPPER EXTREMITY ROM:  Active ROM Right eval Left eval  Shoulder flexion  Limited ~90 degrees   Shoulder extension    Shoulder abduction    Shoulder adduction    Shoulder extension    Shoulder internal rotation    Shoulder external rotation    Elbow flexion    Elbow extension    Wrist flexion    Wrist extension    Wrist ulnar deviation    Wrist radial deviation    Wrist pronation    Wrist supination     (Blank rows = not tested)  UPPER EXTREMITY MMT:  Unable to tolerate due to significant pain on evaluation on eval   MMT Right 10/17/23 Left 10/17/23  Shoulder flexion 3-* 3-*  Shoulder extension    Shoulder abduction 2+* 2+*  Shoulder adduction    Shoulder extension    Shoulder internal rotation 3-* 3-*  Shoulder external rotation 3-* 3-*  Middle trapezius    Lower trapezius    Elbow flexion 3-* 3-*  Elbow extension 3-* 3-*  Wrist flexion 2+* 2+*  Wrist extension 2+* 2+*  Wrist ulnar deviation    Wrist radial deviation    Wrist pronation    Wrist supination    Grip strength     (Blank rows = not tested)  CERVICAL SPECIAL TESTS:  Unable to tolerate 2/2 significant pain on evaluation  FUNCTIONAL TESTS:  Cervical endurance test: to be assessed next visit   TODAY'S TREATMENT:                                                                                                                              DATE: 11/15/23     11/15/23: Moist heat 5' Manual:  STM to B UT, levator, and suboccipitals x 10 minutes  Suboccipital release 3 x 20 seconds  L/R UT stretch with GHJ overpressure 3 x 30 seconds each side TE:  Supine protraction/retraction 2 x 10; reach for wall then reach for sky  Cervical rotation with Dynadisc as  pillow; breathing synced, x10 each direction   Cervical rotation with deflated ball as pillow; x10 each direction   UE bike 2' forward, 2' backward    11/09/23: Moist heat 5' Manual:  STM to B UT, levator, and suboccipitals x 10 minutes  Suboccipital release 2-3 x 20 seconds  L/R UT stretch with GHJ overpressure 3 x 30 seconds each side   TE:  Supine protraction/retraction 2 x 10; reach for wall then reach for sky  Cervical rotation with Dynadisc as pillow; breathing synced, x10 each direction   UE bike 2' forward, 2' backward   Standing shoulder extension with GTB x 10  Standing rows with GTB x 10    11/01/23: Manual: STM to B UT, levator, and suboccipitals x 10 minutes Suboccipital release 2-3 x 20 seconds L/R UT stretch with GHJ overpressure 3 x 30 seconds each side   TE:  Shoulder horizontal abduction with RTB (loops tied to end due to patient's limited dexterity) x 12  Standing rows with GTB  2 x 10 (loops tied to end due to patient's limited dexterity) Standing shoulder extension with GTB x 10  Standing shoulder flexion with GTB x 10  Standing bicep curls with GTB x 10   Provided patient with GTB and RTB with loops tied at end for new HEP, see below  PATIENT EDUCATION:  Education details: HEP, POC, goals Person educated: Patient  Education method: Explanation, Demonstration, and Handouts Education comprehension: verbalized understanding  HOME EXERCISE PROGRAM: Access Code: [email or web address] URL: [email or web address] Date: 10/12/2023 Prepared by: Maylon Peppers  Exercises - Seated Upper Trapezius Stretch  - 2-3 x daily - 5-7 x weekly - 5 reps - 30 second  hold - Seated Assisted Cervical Rotation with Towel  - 2-3 x daily - 5-7 x weekly - 10 reps - 10-15 hold - Seated Shoulder Rolls  - 2-3 x daily - 5-7 x weekly - 10 reps  Access Code: [facility] URL: [email or web address] Date: 11/01/2023 Prepared by: Maylon Peppers  Exercises - Standing  Shoulder Row with Anchored  Resistance  - 2-3 x daily - 5-7 x weekly - 3 sets - 10 reps - Shoulder extension with resistance - Neutral  - 2-3 x daily - 5-7 x weekly - 3 sets - 10 reps - Standing Shoulder Horizontal Abduction with Resistance  - 2-3 x daily - 5-7 x weekly - 3 sets - 10 reps - Shoulder Flexion with Anterior Anchored Resistance  - 2-3 x daily - 5-7 x weekly - 3 sets - 10 reps - Seated Single Arm Elbow Flexion with Resistance  - 2-3 x daily - 5-7 x weekly - 3 sets - 10 reps  ASSESSMENT:  CLINICAL IMPRESSION:     Patient arrives to treatment session motivated to participate with reports of being stiff and achy in her neck and shoulder. Session focused on manual stretching, STM, scapular and shoulder mobility. Introduction of neck rotation AAROM in supine with a deflated ball resulted in noticeable ROM improvements immediately and patient liked it more than on the dynadisc. Patient still needed verbal and tactile cues as well as demonstration for proper form during retraction and protractions but was able to execute motions with good control after cuing. Patient highly aggravated by palpation of left upper trap and levator; resulted in so much pain that the patient cried. Discussed possibility of dry needling intervention for trigger point release. Patient give hand out and educated on expectations, prognosis, and outcomes. Will do further research and decide next visit. Able to use arm bike at EOS without any increase in pain. Patient will benefit from skilled PT interventions to address listed impairments to improve QOL and reduce pain.  OBJECTIVE IMPAIRMENTS: decreased activity tolerance, decreased endurance, decreased mobility, decreased ROM, decreased strength, hypomobility, impaired flexibility, impaired sensation, impaired UE functional use, postural dysfunction, and pain.   ACTIVITY LIMITATIONS: carrying, lifting, and reach over head  PARTICIPATION LIMITATIONS: meal prep, cleaning,  laundry, driving, community activity, and occupation  PERSONAL FACTORS: Age, Behavior pattern, Past/current experiences, and Time since onset of injury/illness/exacerbation are also affecting patient's functional outcome.   REHAB POTENTIAL: Fair    CLINICAL DECISION MAKING: Stable/uncomplicated  EVALUATION COMPLEXITY: Low   GOALS: Goals reviewed with patient? Yes  SHORT TERM GOALS: Target date: 11/01/2023  Patient will be independent in HEP to improve strength/mobility for better functional independence with ADLs. Baseline: 11/6: HEP initiated Goal status: MET  LONG TERM GOALS: Target date: 11/22/2023  Patient will reduce Neck Disability Index score to <30% to demonstrate minimal disability with ADL's including improved sleeping tolerance, sitting tolerance, etc for better mobility at home and work. Baseline: 11/6: to be given visit #2; 11/11: 62% Goal status: INITIAL  2.  Patient will report a worst pain of 5/10 on NRPS in neck to improve tolerance with ADLs and reduced symptoms with activities.  Baseline: 11/6: 9/10  Goal status: INITIAL  3.  Patient will improve B UE strength by 1/2 MMT grade to demonstrate improved functional UE strength to be able to complete ADLs and few iADLs without assistance.  Baseline: 11/6: MMT to assessed at visit #2 11/11: see above  Goal status: INITIAL  4.  Patient will improve cervical flexor endurance test by >15 seconds to demonstrate improved cervical strength without reports of increased pain/symptoms.  Baseline: 11/19: 10.20 seconds  Goal status: INITIAL   PLAN:  PT FREQUENCY: 1-2x/week  PT DURATION: 6 weeks  PLANNED INTERVENTIONS: 97164- PT Re-evaluation, 97110-Therapeutic exercises, 97530- Therapeutic activity, 97112- Neuromuscular re-education, 97535- Self Care, 84696- Manual therapy, 97014- Electrical stimulation (unattended), 29528- Traction (mechanical),  Patient/Family education, Dry Needling, Spinal manipulation, Spinal  mobilization, Cryotherapy, and Moist heat  PLAN FOR NEXT SESSION: manual therapy and light scapular strengthening and mobility. Trigger point release with tennis ball or dry needling.    8:43 AM, 11/15/23 Karna Christmas, SPT  " I agree with the following treatment note after reviewing documentation. This session was performed under the supervision of a licensed clinician."  8:43 AM, 11/15/23 Amy Small Lynch MPT [facility] [place] [phone]

## 2023-11-15 NOTE — Patient Instructions (Addendum)

## 2023-11-16 ENCOUNTER — Ambulatory Visit (HOSPITAL_BASED_OUTPATIENT_CLINIC_OR_DEPARTMENT_OTHER): Payer: Medicaid Other | Admitting: Orthopaedic Surgery

## 2023-11-16 ENCOUNTER — Ambulatory Visit (HOSPITAL_BASED_OUTPATIENT_CLINIC_OR_DEPARTMENT_OTHER): Payer: Medicare Other | Admitting: Student

## 2023-11-16 ENCOUNTER — Other Ambulatory Visit: Payer: Self-pay | Admitting: Family Medicine

## 2023-11-16 DIAGNOSIS — M5412 Radiculopathy, cervical region: Secondary | ICD-10-CM

## 2023-11-16 DIAGNOSIS — Z96612 Presence of left artificial shoulder joint: Secondary | ICD-10-CM

## 2023-11-16 NOTE — Progress Notes (Signed)
Post Operative Evaluation    Procedure/Date of Surgery: Left reverse shoulder arthroplasty 11/16/22  Interval History:   Patient presents today for follow-up of her left shoulder and neck pain.  She is almost exactly 1 year status post left reverse shoulder arthroplasty.  She does continue to have some numbness in the volar and lateral left forearm.  EMG study performed in September did show evidence of a left C6 radiculopathy.  She did undergo an ESI with Dr. Alvester Morin on 4/22 and gained at most 2 months of relief.  She has been seeing physical therapy as well for her neck or considering dry needling as she has tension in the upper traps.  Has been using a heating pad.  Takes Lyrica but has not noticed a benefit with this.  PMH/PSH/Family History/Social History/Meds/Allergies:    Past Medical History:  Diagnosis Date   Arthritis    Asthma    Back pain    Breast cancer (HCC)    Buttock pain    Dyspnea    on exertion   Family history of breast cancer 09/22/2020   Hypertension    Multiple lung nodules on CT 2022   Personal history of chemotherapy    Personal history of radiation therapy    Past Surgical History:  Procedure Laterality Date   ABDOMINAL HYSTERECTOMY     APPENDECTOMY     BREAST LUMPECTOMY WITH RADIOACTIVE SEED AND SENTINEL LYMPH NODE BIOPSY Right 09/10/2020   Procedure: RIGHT BREAST LUMPECTOMY WITH RADIOACTIVE SEED AND SENTINEL LYMPH NODE MAPPING;  Surgeon: Harriette Bouillon, MD;  Location: [facility];  Service: General;  Laterality: Right;   COLONOSCOPY WITH PROPOFOL N/A 10/21/2023   Procedure: COLONOSCOPY WITH PROPOFOL;  Surgeon: Lanelle Bal, DO;  [facility];  Service: Endoscopy;  Laterality: N/A;  11:00am, asa 3   POLYPECTOMY  10/21/2023   Procedure: POLYPECTOMY INTESTINAL;  Surgeon: Lanelle Bal, DO;  [facility];  Service: Endoscopy;;   PORTACATH PLACEMENT Right 10/16/2020    Procedure: INSERTION PORT-A-CATH WITH ULTRASOUND GUIDANCE;  Surgeon: Harriette Bouillon, MD;  Location: [facility];  Service: General;  Laterality: Right;   RE-EXCISION OF BREAST LUMPECTOMY Right 10/16/2020   Procedure: RE-EXCISION OF RIGHT BREAST LUMPECTOMY;  Surgeon: Harriette Bouillon, MD;  Location: [facility];  Service: General;  Laterality: Right;   REVERSE SHOULDER ARTHROPLASTY Left 11/16/2022   Procedure: LEFT REVERSE SHOULDER ARTHROPLASTY;  Surgeon: Huel Cote, MD;  Location: [facility];  Service: Orthopedics;  Laterality: Left;   Social History   Socioeconomic History   Marital status: Single    Spouse name: Not on file   Number of children: Not on file   Years of education: Not on file   Highest education level: Not on file  Occupational History   Not on file  Tobacco Use   Smoking status: Former    Current packs/day: 0.00    Average packs/day: 0.5 packs/day for 35.0 years (17.5 ttl pk-yrs)    Types: Cigarettes    Start date: 08/22/1985    Quit date: 08/22/2020    Years since quitting: 3.2   Smokeless tobacco: Never  Vaping Use   Vaping status: Never Used  Substance and Sexual Activity   Alcohol use: No   Drug use: Never   Sexual  activity: Not Currently    Birth control/protection: Surgical  Other Topics Concern   Not on file  Social History Narrative   Not on file   Social Determinants of Health   Financial Resource Strain: Not on file  Food Insecurity: Not on file  Transportation Needs: Not on file  Physical Activity: Not on file  Stress: Not on file  Social Connections: Not on file   Family History  Problem Relation Age of Onset   Hypertension Father    Hypertension Sister    Hypertension Maternal Grandmother    Breast cancer Maternal Aunt 28   Breast cancer Paternal Aunt        dx at unknown age   Breast cancer Other        MGM's niece; dx 65s   Breast cancer Other        MGF's niece   Allergies   Allergen Reactions   Oxycodone Itching and Swelling   Current Outpatient Medications  Medication Sig Dispense Refill   ADVAIR HFA 115-21 MCG/ACT inhaler Inhale 2 puffs into the lungs 2 (two) times daily.     albuterol (PROVENTIL) (2.5 MG/3ML) 0.083% nebulizer solution Take 3 mLs (2.5 mg total) by nebulization every 6 (six) hours as needed for wheezing or shortness of breath. 75 mL 1   albuterol (VENTOLIN HFA) 108 (90 Base) MCG/ACT inhaler Inhale 2 puffs into the lungs every 4 (four) hours as needed for wheezing or shortness of breath. 8 g 3   calcium citrate (CALCITRATE - DOSED IN MG ELEMENTAL CALCIUM) 950 (200 Ca) MG tablet Take 200 mg of elemental calcium by mouth daily.     clonazePAM (KLONOPIN) 0.5 MG tablet Take 0.5 mg by mouth daily as needed.     escitalopram (LEXAPRO) 20 MG tablet Take by mouth.     gabapentin (NEURONTIN) 300 MG capsule Take 1 capsule (300 mg total) by mouth 3 (three) times daily as needed (numbness/burning pain). Start with 1 capsule at nighttime, and can increase up to 3 times daily 90 capsule 2   letrozole (FEMARA) 2.5 MG tablet TAKE 1 TABLET ONCE DAILY. 90 tablet 3   lisinopril (ZESTRIL) 10 MG tablet Take 1 tablet (10 mg total) by mouth daily.     pregabalin (LYRICA) 75 MG capsule Take 1 capsule (75 mg total) by mouth 2 (two) times daily. 60 capsule 5   temazepam (RESTORIL) 15 MG capsule Take 15 mg by mouth at bedtime as needed for sleep.     No current facility-administered medications for this visit.   No results found.  Review of Systems:   A ROS was performed including pertinent positives and negatives as documented in the HPI.   Musculoskeletal Exam:    There were no vitals taken for this visit.  Active range of motion of the left shoulder to 130 degrees forward flexion, external rotation to 30 degrees, and internal rotation to the back pocket.  Decreased sensation over the lateral left forearm.  Mild tenderness in the cervical paraspinal muscles and  upper trapezius with tender trigger points.  Imaging:     Assessment:   52 year old female 1 year status post left reverse shoulder arthroplasty.  She continues to have numbness along the left forearm and EMG study a few months ago did show a C6 radiculopathy.  She has undergone previous ESI with some short-term relief.  Her physical medicine and rehab physician did suggest repeating this which I do believe could give her some benefit.  Will plan to  refer her back to Dr. Alvester Morin to have her evaluated.  She also would benefit from targeted dry needling as she has developed tightness and trigger points in the neck and upper back.  Will plan to continue PT for this.  Plan :    -Referral to Dr. Alvester Morin for possible repeat ESI -Continue with physical therapy with addition of dry needling      I personally saw and evaluated the patient, and participated in the management and treatment plan.   Hazle Nordmann, PA-C Orthopedics

## 2023-11-22 ENCOUNTER — Encounter (HOSPITAL_COMMUNITY): Payer: Medicare Other

## 2023-11-22 ENCOUNTER — Other Ambulatory Visit: Payer: Self-pay | Admitting: Physical Medicine and Rehabilitation

## 2023-11-22 MED ORDER — DIAZEPAM 5 MG PO TABS: ORAL_TABLET | ORAL | 0 refills | Status: DC

## 2023-11-25 ENCOUNTER — Inpatient Hospital Stay: Payer: Medicare Other | Attending: Hematology and Oncology | Admitting: Hematology and Oncology

## 2023-11-25 VITALS — BP 146/92 | HR 70 | Temp 98.0°F | Resp 18 | Ht 66.0 in | Wt 194.2 lb

## 2023-11-25 DIAGNOSIS — Z79811 Long term (current) use of aromatase inhibitors: Secondary | ICD-10-CM | POA: Diagnosis not present

## 2023-11-25 DIAGNOSIS — Z9221 Personal history of antineoplastic chemotherapy: Secondary | ICD-10-CM | POA: Insufficient documentation

## 2023-11-25 DIAGNOSIS — Z1731 Human epidermal growth factor receptor 2 positive status: Secondary | ICD-10-CM | POA: Diagnosis not present

## 2023-11-25 DIAGNOSIS — Z923 Personal history of irradiation: Secondary | ICD-10-CM | POA: Insufficient documentation

## 2023-11-25 DIAGNOSIS — Z780 Asymptomatic menopausal state: Secondary | ICD-10-CM | POA: Diagnosis not present

## 2023-11-25 DIAGNOSIS — Z170 Estrogen receptor positive status [ER+]: Secondary | ICD-10-CM | POA: Insufficient documentation

## 2023-11-25 DIAGNOSIS — C50411 Malignant neoplasm of upper-outer quadrant of right female breast: Secondary | ICD-10-CM | POA: Diagnosis not present

## 2023-11-25 DIAGNOSIS — Z1721 Progesterone receptor positive status: Secondary | ICD-10-CM | POA: Insufficient documentation

## 2023-11-25 MED ORDER — LETROZOLE 2.5 MG PO TABS: 2.5000 mg | ORAL_TABLET | Freq: Every day | ORAL | 3 refills | Status: DC

## 2023-11-25 NOTE — Assessment & Plan Note (Signed)
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  ER 80%, PR 60%, Ki-67 15%, HER-2 equivocal by IHC positive for fish 10/11/20: Re-excision: Neg   Recommendation: 1. Adjuvant chemotherapy with Taxol Herceptin followed by Herceptin maintenance for 1 year completed November 2022 2. Adjuvant radiation therapy 02/10/21- 03/24/21 3. Adjuvant antiestrogen therapy with anastrozole (patient is postmenopausal) started 04/13/2021 and anti-HER-2 therapy with neratinib -------------------------------------------------------------------------------------------------------------------------------- Current treatment: Initially anastrozole, switched to letrozole 07/06/2021   Letrozole Toxicities: Fatigue Left shoulder arthroplasty 11/16/2022   05/23/2022: Progression of groundglass attenuation in the lungs possibly hypersensitivity pneumonitis, unchanged few small lung nodules stable borderline lymph nodes.  Breast cancer surveillance: Mammogram 10/03/2023: Benign breast density category B Breast exam 11/25/2023: Benign   Return to clinic in 1 year for follow-up

## 2023-11-25 NOTE — Progress Notes (Signed)
Patient Care Team: Gilmore Laroche, FNP as PCP - General (Family Medicine) Serena Croissant, MD as Consulting Physician (Hematology and Oncology) Lonie Peak, MD as Attending Physician (Radiation Oncology) Harriette Bouillon, MD as Consulting Physician (General Surgery)  DIAGNOSIS:  Encounter Diagnoses  Name Primary?   Primary malignant neoplasm of upper outer quadrant of female breast, right (HCC) Yes   Post-menopausal     SUMMARY OF ONCOLOGIC HISTORY: Oncology History  Primary malignant neoplasm of upper outer quadrant of female breast, right (HCC)  08/12/2020 Initial Diagnosis   Screening mammogram on 07/07/20 showed an asymmetry. Diagnostic mammogram and [place] on 07/29/20 showed a 0.8cm mass at the 10 o'clock position and no right axillary adenopathy. Biopsy on 08/12/20 showed invasive and in situ mammary carcinoma, grade 2, HER-2 equivocal by IHC, positive by FISH, ER+ 80%, PR+ 60%, Ki67 15%.   08/26/2020 Cancer Staging   Staging form: Breast, AJCC 8th Edition - Clinical stage from 08/26/2020: Stage IA (cT1b, cN0, cM0, G2, ER+, PR+, HER2+) - Signed by Lonie Peak, MD on 08/27/2020   09/10/2020 Surgery   Right lumpectomy ([facility]): invasive lobular carcinoma, 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  Variants of uncertain significance detected in ATM (c.131A>G (p.Asp44Gly) and c.4658A>C (p.Glu1553Ala)) and WRN (c.2825+6G>T (intronic)).  The report date is October 02, 2020.   The Multi-Cancer Panel offered by [facility] includes sequencing and/or deletion duplication testing of the following 85 genes: AIP, ALK, APC, ATM, AXIN2,BAP1,  BARD1,  BLM, BMPR1A, BRCA1, BRCA2, BRIP1, CASR, CDC73, CDH1, CDK4, CDKN1B, CDKN1C, CDKN2A (p14ARF), CDKN2A (p16INK4a), CEBPA, CHEK2, CTNNA1, DICER1, DIS3L2, EGFR (c.2369C>T, p.Thr790Met variant only), EPCAM (Deletion/duplication testing only), FH, FLCN, GATA2, GPC3, GREM1 (Promoter region deletion/duplication testing only), HOXB13 (c.251G>A, p.Gly84Glu), HRAS, KIT, MAX, MEN1, MET, MITF (c.952G>A, p.Glu318Lys variant only), MLH1, MSH2, MSH3, MSH6, MUTYH, NBN, NF1, NF2, NTHL1, PALB2, PDGFRA, PHOX2B, PMS2, POLD1, POLE, POT1, PRKAR1A, PTCH1, PTEN, RAD50, RAD51C, RAD51D, RB1, RECQL4, RET, RNF43, RUNX1, SDHAF2, SDHA (sequence changes only), SDHB, SDHC, SDHD, SMAD4, SMARCA4, SMARCB1, SMARCE1, STK11, SUFU, TERC, TERT, TMEM127, TP53, TSC1, TSC2, VHL, WRN and WT1.   Amended report: The variant of uncertain significance (VUS) in WRN at  c.2825+6G>T (Intronic) has been reclassified to likely benign.  The change in variant classification was made as a result of re-review of evidence in light of new variant interpretation guidelines and/or new information. The amended report date is December 18, 2020.    02/10/2021 - 03/24/2021 Radiation Therapy   Site Technique Total Dose (Gy) Dose per Fx (Gy) Completed Fx Beam Energies  Breast, Right: Breast_Rt 3D 50/50 2 25/25 10X  Breast, Right: Breast_Rt_PAB_SCV 3D 50/50 2 25/25 6X, 15X  Breast, Right: Breast_Rt_Bst 3D 10/10 2 5/5 6X, 10X     04/13/2021 -  Anti-estrogen oral therapy   Initially anastrozole switched to letrozole for fatigue issues     CHIEF COMPLIANT: Follow-up on letrozole  HISTORY OF PRESENT ILLNESS:  History of Present Illness   The patient, with a history of breast cancer, presents for a routine follow-up. She has been on Letrozole for two years and has noticed hair thinning and loss, which she attributes to the medication. She has tried using coconut oil to manage  the hair loss. She also reports tenderness in the area of her previous breast surgery, which has  persisted for years.  In addition to her breast cancer history, the patient recently had a colonoscopy during which six polyps were found, one of which was enlarged. These were removed and sent for biopsy, which thankfully showed no signs of malignancy. The patient expressed confusion about the cause of these polyps, with family members suggesting they were due to consumption of popcorn and peanuts.  The patient also had a bone density test in June 2023 and is due for another in 2025. She is scheduled for a mammogram in October 2029.         ALLERGIES:  is allergic to oxycodone.  MEDICATIONS:  Current Outpatient Medications  Medication Sig Dispense Refill   ADVAIR HFA 115-21 MCG/ACT inhaler Inhale 2 puffs into the lungs 2 (two) times daily.     albuterol (PROVENTIL) (2.5 MG/3ML) 0.083% nebulizer solution Take 3 mLs (2.5 mg total) by nebulization every 6 (six) hours as needed for wheezing or shortness of breath. 75 mL 1   albuterol (VENTOLIN HFA) 108 (90 Base) MCG/ACT inhaler Inhale 2 puffs into the lungs every 4 (four) hours as needed for wheezing or shortness of breath. 8 g 3   calcium citrate (CALCITRATE - DOSED IN MG ELEMENTAL CALCIUM) 950 (200 Ca) MG tablet Take 200 mg of elemental calcium by mouth daily.     clonazePAM (KLONOPIN) 0.5 MG tablet Take 0.5 mg by mouth daily as needed.     diazepam (VALIUM) 5 MG tablet Take one tablet by mouth with food one hour prior to procedure. May repeat 30 minutes prior if needed. 2 tablet 0   escitalopram (LEXAPRO) 20 MG tablet Take by mouth.     gabapentin (NEURONTIN) 300 MG capsule Take 1 capsule (300 mg total) by mouth 3 (three) times daily as needed (numbness/burning pain). Start with 1 capsule at nighttime, and can increase up to 3 times daily 90 capsule 2   letrozole (FEMARA) 2.5 MG tablet Take 1 tablet (2.5 mg total) by mouth daily. 90 tablet 3   lisinopril (ZESTRIL) 10 MG tablet TAKE ONE TABLET DAILY 30 tablet 0   pregabalin (LYRICA) 75 MG  capsule Take 1 capsule (75 mg total) by mouth 2 (two) times daily. 60 capsule 5   temazepam (RESTORIL) 15 MG capsule Take 15 mg by mouth at bedtime as needed for sleep.     No current facility-administered medications for this visit.    PHYSICAL EXAMINATION: ECOG PERFORMANCE STATUS: 1 - Symptomatic but completely ambulatory  Vitals:   11/25/23 0911  BP: (!) 146/92  Pulse: 70  Resp: 18  Temp: 98 F (36.7 C)  SpO2: 99%   Filed Weights   11/25/23 0911  Weight: 194 lb 3.2 oz (88.1 kg)      LABORATORY DATA:  I have reviewed the data as listed    Latest Ref Rng & Units 08/31/2023   11:12 AM 07/31/2022    2:09 PM 02/15/2022    9:33 AM  CMP  Glucose 70 - 99 mg/dL 87  188  96   BUN 6 - 24 mg/dL 7  9  9    Creatinine 0.57 - 1.00 mg/dL 4.16  6.06  3.01   Sodium 134 - 144 mmol/L 142  139  140   Potassium 3.5 - 5.2 mmol/L 4.1  2.9  3.5   Chloride 96 - 106 mmol/L 106  107  109   CO2 20 -  29 mmol/L 22  23  27    Calcium 8.7 - 10.2 mg/dL 9.8  9.2  9.8   Total Protein 6.0 - 8.5 g/dL 7.0   7.1   Total Bilirubin 0.0 - 1.2 mg/dL 0.5   0.6   Alkaline Phos 44 - 121 IU/L 141   104   AST 0 - 40 IU/L 33   27   ALT 0 - 32 IU/L 25   23     Lab Results  Component Value Date   WBC 7.0 08/31/2023   HGB 13.2 08/31/2023   HCT 41.6 08/31/2023   MCV 85 08/31/2023   PLT 228 08/31/2023   NEUTROABS 4.6 08/31/2023    ASSESSMENT & PLAN:  Primary malignant neoplasm of upper outer quadrant of female breast, right (HCC) 09/10/2020:Right lumpectomy ([facility]): invasive lobular carcinoma, 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  ER 80%, PR 60%, Ki-67 15%, HER-2 equivocal by IHC positive for fish 10/11/20: Re-excision: Neg   Recommendation: 1. Adjuvant chemotherapy with Taxol Herceptin followed by Herceptin maintenance for 1 year completed November 2022 2. Adjuvant radiation therapy 02/10/21- 03/24/21 3. Adjuvant antiestrogen therapy with anastrozole  (patient is postmenopausal) started 04/13/2021 and anti-HER-2 therapy with neratinib -------------------------------------------------------------------------------------------------------------------------------- Current treatment: Initially anastrozole, switched to letrozole 07/06/2021   Letrozole Toxicities: Fatigue Left shoulder arthroplasty 11/16/2022   05/23/2022: Progression of groundglass attenuation in the lungs possibly hypersensitivity pneumonitis, unchanged few small lung nodules stable borderline lymph nodes.  Breast cancer surveillance: Mammogram 10/03/2023: Benign breast density category B    Bone Health Last bone density scan in June 2023. No current issues reported. -Schedule next bone density scan for October 2025, to coincide with mammogram.     Return to clinic in 1 year for follow-up     Orders Placed This Encounter  Procedures   DG Bone Density    Standing Status:   Future    Expected Date:   10/03/2024    Expiration Date:   11/24/2024    Scheduling Instructions:     Please do it at same time as her mammograms    Reason for Exam (SYMPTOM  OR DIAGNOSIS REQUIRED):   bone density on anti estrogens    Is patient pregnant?:   No    [facility]    Release to patient:   Immediate   The patient has a good understanding of the overall plan. she agrees with it. she will call with any problems that may develop before the next visit here. Total time spent: 30 mins including face to face time and time spent for planning, charting and co-ordination of care   Tamsen Meek, MD 11/25/23

## 2023-12-02 ENCOUNTER — Ambulatory Visit (HOSPITAL_COMMUNITY): Payer: Medicare Other

## 2023-12-02 DIAGNOSIS — M6281 Muscle weakness (generalized): Secondary | ICD-10-CM

## 2023-12-02 DIAGNOSIS — M4722 Other spondylosis with radiculopathy, cervical region: Secondary | ICD-10-CM | POA: Diagnosis not present

## 2023-12-02 DIAGNOSIS — M25612 Stiffness of left shoulder, not elsewhere classified: Secondary | ICD-10-CM

## 2023-12-02 NOTE — Therapy (Signed)
OUTPATIENT PHYSICAL THERAPY CERVICAL TREATMENT/PROGRESS NOTE/ DISCHARGE Progress Note Reporting Period 10/12/2023 to 12/02/2023  See note below for Objective Data and Assessment of Progress/Goals.  PHYSICAL THERAPY DISCHARGE SUMMARY  Visits from Start of Care: 7  Current functional level related to goals / functional outcomes: See below   Remaining deficits: See below   Education / Equipment: HEP   Patient agrees to discharge. Patient goals were partially met. Patient is being discharged due to lack of progress.       Patient Name: Monica [name] MRN: [id number] DOB:22-Dec-1970, 52 y.o., female Today's Date: 12/02/2023  END OF SESSION:  PT End of Session - 12/02/23 1145     Visit Number 7    Number of Visits 12    Date for PT Re-Evaluation 11/23/23    Authorization Type Medicare Part A & B    PT Start Time 1144    PT Stop Time 1207    PT Time Calculation (min) 23 min    Activity Tolerance Patient tolerated treatment well;No increased pain    Behavior During Therapy WFL for tasks assessed/performed             Past Medical History:  Diagnosis Date   Arthritis    Asthma    Back pain    Breast cancer (HCC)    Buttock pain    Dyspnea    on exertion   Family history of breast cancer 09/22/2020   Hypertension    Multiple lung nodules on CT 2022   Personal history of chemotherapy    Personal history of radiation therapy    Past Surgical History:  Procedure Laterality Date   ABDOMINAL HYSTERECTOMY     APPENDECTOMY     BREAST LUMPECTOMY WITH RADIOACTIVE SEED AND SENTINEL LYMPH NODE BIOPSY Right 09/10/2020   Procedure: RIGHT BREAST LUMPECTOMY WITH RADIOACTIVE SEED AND SENTINEL LYMPH NODE MAPPING;  Surgeon: Harriette Bouillon, MD;  Location: [facility];  Service: General;  Laterality: Right;   COLONOSCOPY WITH PROPOFOL N/A 10/21/2023   Procedure: COLONOSCOPY WITH PROPOFOL;  Surgeon: Lanelle Bal, DO;  [facility];   Service: Endoscopy;  Laterality: N/A;  11:00am, asa 3   POLYPECTOMY  10/21/2023   Procedure: POLYPECTOMY INTESTINAL;  Surgeon: Lanelle Bal, DO;  Location: [facility];  Service: Endoscopy;;   PORTACATH PLACEMENT Right 10/16/2020   Procedure: INSERTION PORT-A-CATH WITH ULTRASOUND GUIDANCE;  Surgeon: Harriette Bouillon, MD;  Location: [facility];  Service: General;  Laterality: Right;   RE-EXCISION OF BREAST LUMPECTOMY Right 10/16/2020   Procedure: RE-EXCISION OF RIGHT BREAST LUMPECTOMY;  Surgeon: Harriette Bouillon, MD;  Location: [facility];  Service: General;  Laterality: Right;   REVERSE SHOULDER ARTHROPLASTY Left 11/16/2022   Procedure: LEFT REVERSE SHOULDER ARTHROPLASTY;  Surgeon: Huel Cote, MD;  Location: [facility];  Service: Orthopedics;  Laterality: Left;   Patient Active Problem List   Diagnosis Date Noted   Obesity (BMI 30-39.9) 10/01/2023   Cervical cancer screening 09/15/2023   Moderate persistent asthma 08/31/2023   Hypertension 08/31/2023   Difficulty sleeping 07/31/2023   Depression, recurrent (HCC) 03/24/2023   Cervical radiculopathy 03/24/2023   Primary osteoarthritis, left shoulder 11/16/2022   Spondylolisthesis at L5-S1 level 01/04/2022   Lumbar radiculopathy 01/04/2022   Trochanteric bursitis of left hip 11/10/2021   Muscle cramp, nocturnal 11/10/2021   Port-A-Cath in place 12/29/2020   Genetic testing 10/08/2020   Family history of breast cancer 09/22/2020   Primary malignant neoplasm of  upper outer quadrant of female breast, right (HCC) 08/27/2020    PCP: Gilmore Laroche, FNP  REFERRING PROVIDER: Angelina Sheriff, DO  REFERRING DIAG:  [phone] (ICD-10-CM) - Cervical radiculopathy M19.012 (ICD-10-CM) - Primary osteoarthritis, left shoulder  THERAPY DIAG:  Cervical spondylosis with radiculopathy  Stiffness of left shoulder, not elsewhere classified  Muscle weakness (generalized)  Rationale for  Evaluation and Treatment: Rehabilitation  ONSET DATE: 11/2022   SUBJECTIVE:                                                                                                                                                                                                         SUBJECTIVE STATEMENT:  Having a procedure done on 12/11/22 for her neck; ready for discharge; neck and back bother her quite a bit; hands bothering her.  Overall neck is about the same since start of therapy  Eval: Patient reports that the neck pain began after L TSA in 11/2022. Patient reports shooting, numbness, tingling in L hand. Difficulty with gripping and tends to drop objects at times. Has only been taking her Lyrica as needed rather than per MD note "twice daily". Difficulty lifting, gripping, reaching. Has aide that assists with cleaning, laundry, cooking, and all tasks that require lifting.  Hand dominance: Right  PERTINENT HISTORY:  Had EMG with Dr. Shearon Stalls 9/23; results = L C6 radiculopathy and myopathic changes in L deltoid muscle.  Known to this clinic L TSA earlier this year and received OT for this.   PAIN:  Are you having pain? Yes: NPRS scale: 10/10 Pain location: neck, L arm  Pain description: shooting, numbness, tingling, achy Aggravating factors: laying on L side Relieving factors: pain medication occasionally (Lyrica), heat, stress ball   PRECAUTIONS: None  RED FLAGS: None     WEIGHT BEARING RESTRICTIONS: No  FALLS:  Has patient fallen in last 6 months? No  LIVING ENVIRONMENT: Lives with: lives alone Lives in: House/apartment Stairs: No Has following equipment at home: None  OCCUPATION: disabled  PLOF: Needs assistance with homemaking - has an aide that helps with cleaning  PATIENT GOALS: to reduce pain  NEXT MD VISIT: 12/28/23  OBJECTIVE:  Note: Objective measures were completed at Evaluation unless otherwise noted.  DIAGNOSTIC FINDINGS:  EMG with Dr. Shearon Stalls 9/23; results =  L C6 radiculopathy and myopathic changes in L deltoid muscle.   PATIENT SURVEYS:  NDI 62%  COGNITION: Overall cognitive status: Within functional limits for tasks assessed  SENSATION: Light touch: Impaired - decreased sensation in L forearm  POSTURE: rounded shoulders and forward head  PALPATION: Significant tightness noted to B UT with TTP to L UT   CERVICAL ROM:   Active ROM A/PROM (deg) eval AROM 12/02/23 % amount available  Flexion 50% 50  Extension 50% 25  Right lateral flexion 25% 25  Left lateral flexion 25% 25  Right rotation 25% 30  Left rotation 25%  25   (Blank rows = not tested)  UPPER EXTREMITY ROM:  Active ROM Right eval Left eval Right 12/02/23 Left 12/02/23  Shoulder flexion  Limited ~90 degrees  135 112  Shoulder extension      Shoulder abduction      Shoulder adduction      Shoulder extension      Shoulder internal rotation      Shoulder external rotation      Elbow flexion      Elbow extension      Wrist flexion      Wrist extension      Wrist ulnar deviation      Wrist radial deviation      Wrist pronation      Wrist supination       (Blank rows = not tested)  UPPER EXTREMITY MMT:  Unable to tolerate due to significant pain on evaluation on eval   MMT Right 10/17/23 Left 10/17/23  Shoulder flexion 3-* 3-*  Shoulder extension    Shoulder abduction 2+* 2+*  Shoulder adduction    Shoulder extension    Shoulder internal rotation 3-* 3-*  Shoulder external rotation 3-* 3-*  Middle trapezius    Lower trapezius    Elbow flexion 3-* 3-*  Elbow extension 3-* 3-*  Wrist flexion 2+* 2+*  Wrist extension 2+* 2+*  Wrist ulnar deviation    Wrist radial deviation    Wrist pronation    Wrist supination    Grip strength     (Blank rows = not tested)  CERVICAL SPECIAL TESTS:  Unable to tolerate 2/2 significant pain on evaluation  FUNCTIONAL TESTS:  Cervical endurance test: to be assessed next visit   TODAY'S TREATMENT:                                                                                                                               DATE: 12/02/23 Progress note NDI 31/50 62% same score as at eval AROM see above neck and shoulders        11/15/23: Moist heat 5' Manual:  STM to B UT, levator, and suboccipitals x 10 minutes  Suboccipital release 3 x 20 seconds  L/R UT stretch with GHJ overpressure 3 x 30 seconds each side TE:  Supine protraction/retraction 2 x 10; reach for wall then reach for sky  Cervical rotation with Dynadisc as pillow; breathing synced, x10 each direction   Cervical rotation with deflated ball as pillow; x10 each direction   UE bike 2' forward, 2' backward    11/09/23: Moist heat 5' Manual:  STM to B UT, levator,  and suboccipitals x 10 minutes  Suboccipital release 2-3 x 20 seconds  L/R UT stretch with GHJ overpressure 3 x 30 seconds each side   TE:  Supine protraction/retraction 2 x 10; reach for wall then reach for sky  Cervical rotation with Dynadisc as pillow; breathing synced, x10 each direction   UE bike 2' forward, 2' backward   Standing shoulder extension with GTB x 10  Standing rows with GTB x 10    11/01/23: Manual: STM to B UT, levator, and suboccipitals x 10 minutes Suboccipital release 2-3 x 20 seconds L/R UT stretch with GHJ overpressure 3 x 30 seconds each side   TE:  Shoulder horizontal abduction with RTB (loops tied to end due to patient's limited dexterity) x 12  Standing rows with GTB  2 x 10 (loops tied to end due to patient's limited dexterity) Standing shoulder extension with GTB x 10  Standing shoulder flexion with GTB x 10  Standing bicep curls with GTB x 10   Provided patient with GTB and RTB with loops tied at end for new HEP, see below  PATIENT EDUCATION:  Education details: HEP, POC, goals Person educated: Patient  Education method: Explanation, Demonstration, and Handouts Education comprehension: verbalized understanding  HOME  EXERCISE PROGRAM: Access Code: [email or web address] URL: [email or web address] Date: 10/12/2023 Prepared by: Maylon Peppers  Exercises - Seated Upper Trapezius Stretch  - 2-3 x daily - 5-7 x weekly - 5 reps - 30 second  hold - Seated Assisted Cervical Rotation with Towel  - 2-3 x daily - 5-7 x weekly - 10 reps - 10-15 hold - Seated Shoulder Rolls  - 2-3 x daily - 5-7 x weekly - 10 reps  Access Code: [facility] URL: [email or web address] Date: 11/01/2023 Prepared by: Maylon Peppers  Exercises - Standing Shoulder Row with Anchored Resistance  - 2-3 x daily - 5-7 x weekly - 3 sets - 10 reps - Shoulder extension with resistance - Neutral  - 2-3 x daily - 5-7 x weekly - 3 sets - 10 reps - Standing Shoulder Horizontal Abduction with Resistance  - 2-3 x daily - 5-7 x weekly - 3 sets - 10 reps - Shoulder Flexion with Anterior Anchored Resistance  - 2-3 x daily - 5-7 x weekly - 3 sets - 10 reps - Seated Single Arm Elbow Flexion with Resistance  - 2-3 x daily - 5-7 x weekly - 3 sets - 10 reps  ASSESSMENT:  CLINICAL IMPRESSION:     Progress note today. No changes in NDI; minimal change objective measures. Will discharge at this time to I HEP and patient will be having an injection 1/6.   OBJECTIVE IMPAIRMENTS: decreased activity tolerance, decreased endurance, decreased mobility, decreased ROM, decreased strength, hypomobility, impaired flexibility, impaired sensation, impaired UE functional use, postural dysfunction, and pain.   ACTIVITY LIMITATIONS: carrying, lifting, and reach over head  PARTICIPATION LIMITATIONS: meal prep, cleaning, laundry, driving, community activity, and occupation  PERSONAL FACTORS: Age, Behavior pattern, Past/current experiences, and Time since onset of injury/illness/exacerbation are also affecting patient's functional outcome.   REHAB POTENTIAL: Fair    CLINICAL DECISION MAKING: Stable/uncomplicated  EVALUATION COMPLEXITY: Low   GOALS: Goals  reviewed with patient? Yes  SHORT TERM GOALS: Target date: 11/01/2023  Patient will be independent in HEP to improve strength/mobility for better functional independence with ADLs. Baseline: 11/6: HEP initiated Goal status: MET  LONG TERM GOALS: Target date: 11/22/2023  Patient will reduce Neck Disability Index score to <30% to demonstrate minimal disability with ADL's including  improved sleeping tolerance, sitting tolerance, etc for better mobility at home and work. Baseline: 11/6: to be given visit #2; 11/11: 62%; 12/27 62% Goal status: In progress  2.  Patient will report a worst pain of 5/10 on NRPS in neck to improve tolerance with ADLs and reduced symptoms with activities.  Baseline: 11/6: 9/10 ; 12/02/23 0/10 with Tylenol arthritis; 10/10 at worst Goal status: in progress  3.  Patient will improve B UE strength by 1/2 MMT grade to demonstrate improved functional UE strength to be able to complete ADLs and few iADLs without assistance.  Baseline: 11/6: MMT to assessed at visit #2 11/11: see above  Goal status: INITIAL  4.  Patient will improve cervical flexor endurance test by >15 seconds to demonstrate improved cervical strength without reports of increased pain/symptoms.  Baseline: 11/19: 10.20 seconds  Goal status: INITIAL   PLAN:  PT FREQUENCY: 1-2x/week  PT DURATION: 6 weeks  PLANNED INTERVENTIONS: 97164- PT Re-evaluation, 97110-Therapeutic exercises, 97530- Therapeutic activity, 97112- Neuromuscular re-education, 97535- Self Care, 16109- Manual therapy, 97014- Electrical stimulation (unattended), 97012- Traction (mechanical), Patient/Family education, Dry Needling, Spinal manipulation, Spinal mobilization, Cryotherapy, and Moist heat  PLAN FOR NEXT SESSION: discharge and having steroid shot on 1/6    12:08 PM, 12/02/23 [place] Dann Small Vartan Kerins MPT [facility] [place] [phone] [phone]

## 2023-12-05 ENCOUNTER — Telehealth: Payer: Self-pay | Admitting: Physical Medicine and Rehabilitation

## 2023-12-05 ENCOUNTER — Other Ambulatory Visit: Payer: Self-pay | Admitting: Family Medicine

## 2023-12-05 DIAGNOSIS — I10 Essential (primary) hypertension: Secondary | ICD-10-CM

## 2023-12-05 MED ORDER — LISINOPRIL 10 MG PO TABS: 10.0000 mg | ORAL_TABLET | Freq: Every day | ORAL | 0 refills | Status: DC

## 2023-12-05 NOTE — Telephone Encounter (Signed)
Patient called. Says she needs something called in for the 1/6 appointment.

## 2023-12-08 ENCOUNTER — Other Ambulatory Visit: Payer: Self-pay | Admitting: Physical Medicine and Rehabilitation

## 2023-12-08 MED ORDER — DIAZEPAM 5 MG PO TABS: ORAL_TABLET | ORAL | 0 refills | Status: DC

## 2023-12-10 ENCOUNTER — Encounter: Payer: Self-pay | Admitting: Hematology and Oncology

## 2023-12-12 ENCOUNTER — Encounter: Payer: Medicare Other | Admitting: Physical Medicine and Rehabilitation

## 2023-12-19 ENCOUNTER — Encounter: Payer: Self-pay | Admitting: Hematology and Oncology

## 2023-12-26 ENCOUNTER — Other Ambulatory Visit: Payer: Self-pay

## 2023-12-26 ENCOUNTER — Ambulatory Visit (INDEPENDENT_AMBULATORY_CARE_PROVIDER_SITE_OTHER): Payer: 59 | Admitting: Physical Medicine and Rehabilitation

## 2023-12-26 DIAGNOSIS — M5412 Radiculopathy, cervical region: Secondary | ICD-10-CM

## 2023-12-26 MED ORDER — METHYLPREDNISOLONE ACETATE 40 MG/ML IJ SUSP
40.0000 mg | Freq: Once | INTRAMUSCULAR | Status: AC
  Administered 2023-12-26: 40 mg

## 2023-12-26 NOTE — Patient Instructions (Signed)

## 2023-12-26 NOTE — Procedures (Signed)
Cervical Epidural Steroid Injection - Interlaminar Approach with Fluoroscopic Guidance  Patient: Monica [name]      Date of Birth: 08-05-1971 MRN: [id number] PCP: Gilmore Laroche, FNP      Visit Date: 12/26/2023   Universal Protocol:    Date/Time: 01/20/251:17 PM  Consent Given By: the patient  Position: PRONE  Additional Comments: Vital signs were monitored before and after the procedure. Patient was prepped and draped in the usual sterile fashion. The correct patient, procedure, and site was verified.   Injection Procedure Details:   Procedure diagnoses: Cervical radiculopathy [M54.12]    Meds Administered:  Meds ordered this encounter  Medications   methylPREDNISolone acetate (DEPO-MEDROL) injection 40 mg     Laterality: Left  Location/Site: C7-T1  Needle: 3.5 in., 20 ga. Tuohy  Needle Placement: Paramedian epidural space  Findings:  -Comments: Excellent flow of contrast into the epidural space.  Procedure Details: Using a paramedian approach from the side mentioned above, the region overlying the inferior lamina was localized under fluoroscopic visualization and the soft tissues overlying this structure were infiltrated with 4 ml. of 1% Lidocaine without Epinephrine. A # 20 gauge, Tuohy needle was inserted into the epidural space using a paramedian approach.  The epidural space was localized using loss of resistance along with contralateral oblique bi-planar fluoroscopic views.  After negative aspirate for air, blood, and CSF, a 2 ml. volume of Isovue-250 was injected into the epidural space and the flow of contrast was observed. Radiographs were obtained for documentation purposes.   The injectate was administered into the level noted above.  Additional Comments:  No complications occurred Dressing: 2 x 2 sterile gauze and Band-Aid    Post-procedure details: Patient was observed during the procedure. Post-procedure instructions were  reviewed.  Patient left the clinic in stable condition.

## 2023-12-26 NOTE — Progress Notes (Signed)
[name] - 53 y.o. female MRN [id number]  Date of birth: 12-21-1970  Office Visit Note: Visit Date: 12/26/2023 PCP: Gilmore Laroche, FNP Referred by: Gilmore Laroche, FNP  Subjective: Chief Complaint  Patient presents with   Neck - Pain   HPI:  Monica [name] is a 53 y.o. female who comes in today/ for planned repeat Left C7-T1  Cervical Interlaminar epidural steroid injection with fluoroscopic guidance.  The patient has failed conservative care including home exercise, medications, time and activity modification.  This injection will be diagnostic and hopefully therapeutic.  Please see requesting physician notes for further details and justification. Patient received more than 50% pain relief from prior injection. Consider surgical referral for information and consultation.  Referring: Hazle Nordmann, PA-C   ROS Otherwise per HPI.  Assessment & Plan: Visit Diagnoses:    ICD-10-CM   1. Cervical radiculopathy  M54.12 XR C-ARM NO REPORT    Epidural Steroid injection    methylPREDNISolone acetate (DEPO-MEDROL) injection 40 mg      Plan: No additional findings.   Meds & Orders:  Meds ordered this encounter  Medications   methylPREDNISolone acetate (DEPO-MEDROL) injection 40 mg    Orders Placed This Encounter  Procedures   XR C-ARM NO REPORT   Epidural Steroid injection    Follow-up: Return for visit to requesting provider as needed.   Procedures: No procedures performed  Cervical Epidural Steroid Injection - Interlaminar Approach with Fluoroscopic Guidance  Patient: Monica [name]      Date of Birth: [birth year] MRN: [id number] PCP: Gilmore Laroche, FNP      Visit Date: 12/26/2023   Universal Protocol:    Date/Time: 01/20/251:17 PM  Consent Given By: the patient  Position: PRONE  Additional Comments: Vital signs were monitored before and after the procedure. Patient was prepped and draped in the usual sterile fashion. The  correct patient, procedure, and site was verified.   Injection Procedure Details:   Procedure diagnoses: Cervical radiculopathy [M54.12]    Meds Administered:  Meds ordered this encounter  Medications   methylPREDNISolone acetate (DEPO-MEDROL) injection 40 mg     Laterality: Left  Location/Site: C7-T1  Needle: 3.5 in., 20 ga. Tuohy  Needle Placement: Paramedian epidural space  Findings:  -Comments: Excellent flow of contrast into the epidural space.  Procedure Details: Using a paramedian approach from the side mentioned above, the region overlying the inferior lamina was localized under fluoroscopic visualization and the soft tissues overlying this structure were infiltrated with 4 ml. of 1% Lidocaine without Epinephrine. A # 20 gauge, Tuohy needle was inserted into the epidural space using a paramedian approach.  The epidural space was localized using loss of resistance along with contralateral oblique bi-planar fluoroscopic views.  After negative aspirate for air, blood, and CSF, a 2 ml. volume of Isovue-250 was injected into the epidural space and the flow of contrast was observed. Radiographs were obtained for documentation purposes.   The injectate was administered into the level noted above.  Additional Comments:  No complications occurred Dressing: 2 x 2 sterile gauze and Band-Aid    Post-procedure details: Patient was observed during the procedure. Post-procedure instructions were reviewed.  Patient left the clinic in stable condition.   Clinical History: MRI CERVICAL SPINE WITHOUT CONTRAST   TECHNIQUE: Multiplanar, multisequence MR imaging of the cervical spine was performed. No intravenous contrast was administered.   COMPARISON:  Prior radiograph from 06/03/2022.   FINDINGS: Alignment: Straightening with mild reversal of the normal cervical lordosis. No  listhesis.   Vertebrae: Vertebral body height maintained without acute or chronic fracture. Bone  marrow signal intensity within normal limits. No discrete or worrisome osseous lesions. No abnormal marrow edema.   Cord: Normal signal and morphology.   Posterior Fossa, vertebral arteries, paraspinal tissues: Unremarkable.   Disc levels:   C2-C3: Minimal annular disc bulge with uncovertebral spurring. No spinal stenosis. Foramina remain patent.   C3-C4: Mild disc bulge with right worse than left uncovertebral spurring. Flattening and partial effacement of the ventral thecal sac, slightly asymmetric to the right. Mild cord flattening without cord signal changes. Mild spinal stenosis. Moderate right C4 foraminal narrowing. Left neural foramina remains patent.   C4-C5: Disc bulge with bilateral uncovertebral spurring. Flattening and effacement of the ventral thecal sac with mild cord flattening but no cord signal changes. Mild spinal stenosis. Moderate right worse than left C5 foraminal narrowing.   C5-C6: Mild disc bulge with right-sided uncovertebral spurring. Flattening and partial effacement of the ventral thecal sac with resultant mild spinal stenosis. Mild right C6 foraminal narrowing. Left neural foramina remains patent.   C6-C7: Mild disc bulge with uncovertebral spurring. Flattening and partial effacement of the ventral thecal sac, slightly asymmetric to the left. Mild spinal stenosis. Mild left C7 foraminal narrowing. Right neural foramen remains patent.   C7-T1: Normal interspace. Mild left-sided facet hypertrophy. No spinal stenosis. Mild left C8 foraminal narrowing. Right neural foramen remains patent.   IMPRESSION: 1. Multilevel cervical spondylosis with resultant mild diffuse spinal stenosis at C3-4 through C6-7. 2. Multifactorial degenerative changes with resultant multilevel foraminal narrowing as above. Notable findings include moderate right C4 and bilateral C5 foraminal stenosis, with mild right C6 and left C7 foraminal narrowing.     Electronically  Signed   By: Rise Mu M.D.   On: 02/28/2023 03:59     Objective:  VS:  HT:    WT:   BMI:     BP:   HR: bpm  TEMP: ( )  RESP:  Physical Exam Vitals and nursing note reviewed.  Constitutional:      General: She is not in acute distress.    Appearance: Normal appearance. She is obese. She is not ill-appearing.  HENT:     Head: Normocephalic and atraumatic.     Right Ear: External ear normal.     Left Ear: External ear normal.  Eyes:     Extraocular Movements: Extraocular movements intact.  Cardiovascular:     Rate and Rhythm: Normal rate.     Pulses: Normal pulses.  Musculoskeletal:     Cervical back: Tenderness present. No rigidity.     Right lower leg: No edema.     Left lower leg: No edema.     Comments: Patient has good strength in the upper extremities including 5 out of 5 strength in wrist extension long finger flexion and APB.  There is no atrophy of the hands intrinsically.  There is a negative Hoffmann's test.   Lymphadenopathy:     Cervical: No cervical adenopathy.  Skin:    Findings: No erythema, lesion or rash.  Neurological:     General: No focal deficit present.     Mental Status: She is alert and oriented to person, place, and time.     Sensory: No sensory deficit.     Motor: No weakness or abnormal muscle tone.     Coordination: Coordination normal.  Psychiatric:        Mood and Affect: Mood normal.  Behavior: Behavior normal.      Imaging: No results found.

## 2023-12-26 NOTE — Progress Notes (Signed)
Functional Pain Scale - descriptive words and definitions  Uncomfortable (3)  Pain is present but can complete all ADL's/sleep is slightly affected and passive distraction only gives marginal relief. Mild range order  Average Pain 3 139/88 L>R +Driver, -BT, -Dye Allergies.

## 2023-12-28 ENCOUNTER — Encounter: Payer: 59 | Attending: Physical Medicine and Rehabilitation | Admitting: Physical Medicine and Rehabilitation

## 2023-12-28 VITALS — BP 122/85 | HR 75 | Ht 66.0 in | Wt 196.0 lb

## 2023-12-28 DIAGNOSIS — M5412 Radiculopathy, cervical region: Secondary | ICD-10-CM | POA: Diagnosis not present

## 2023-12-28 DIAGNOSIS — Z7409 Other reduced mobility: Secondary | ICD-10-CM | POA: Insufficient documentation

## 2023-12-28 DIAGNOSIS — G894 Chronic pain syndrome: Secondary | ICD-10-CM | POA: Diagnosis not present

## 2023-12-28 DIAGNOSIS — M5416 Radiculopathy, lumbar region: Secondary | ICD-10-CM | POA: Diagnosis not present

## 2023-12-28 DIAGNOSIS — Z789 Other specified health status: Secondary | ICD-10-CM | POA: Insufficient documentation

## 2023-12-28 DIAGNOSIS — M7918 Myalgia, other site: Secondary | ICD-10-CM | POA: Diagnosis not present

## 2023-12-28 DIAGNOSIS — M7062 Trochanteric bursitis, left hip: Secondary | ICD-10-CM | POA: Diagnosis not present
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
## 2023-12-28 NOTE — Patient Instructions (Addendum)
Increase gabapentin to 300 mg in the morning, 300 mg in the afternoon, and 600 mg at nighttime for sleep and nerve pain  I agree with Dr. Alvester Morin for allowing time for cervical ESI to work; you may also benefit from ESIs in the low back for lumbar radicular pains  Continue home exercises from PT  Offered trigger point injections, patient defers due to poor results in the past.   Follow up with me in 2 weeks for left greater trochanteric bursa injection; at that time if pain is not better controlled with increased gabapentin, we will do a pain contract and drug screen and resume low dose norco 5 mg three times daily as needed.   I gave you a form for handicapped placcard today. Continue home aide for R arm pain and weakness.

## 2023-12-30 ENCOUNTER — Telehealth: Payer: Self-pay | Admitting: *Deleted

## 2023-12-30 DIAGNOSIS — H52223 Regular astigmatism, bilateral: Secondary | ICD-10-CM | POA: Diagnosis not present

## 2023-12-30 DIAGNOSIS — H524 Presbyopia: Secondary | ICD-10-CM | POA: Diagnosis not present

## 2023-12-30 MED ORDER — GABAPENTIN 300 MG PO CAPS: ORAL_CAPSULE | ORAL | 2 refills | Status: DC

## 2023-12-30 NOTE — Telephone Encounter (Signed)
Monica [name] called and reports the pharmacy does not have her gabapentin order.

## 2023-12-30 NOTE — Telephone Encounter (Signed)
Sent to [facility]; she had said during our appointment that she had some medication at home, sorry for the miscommunication!

## 2024-01-02 ENCOUNTER — Telehealth: Payer: Self-pay | Admitting: *Deleted

## 2024-01-02 ENCOUNTER — Telehealth: Payer: Self-pay

## 2024-01-02 NOTE — Telephone Encounter (Signed)
Pt called saying that her grandson had the flu and was coughing and sneezing but didn't have a temp. Pt complained that she was having similar symptoms. Advised pt to call PCP to f/u and be seen to make sure that she isn't positive for any respiratory disorders. Pt verbalized understanding.

## 2024-01-02 NOTE — Telephone Encounter (Signed)
Copied from CRM [id number]. Topic: Clinical - Medical Advice >> Jan 02, 2024  1:08 PM Shelah Lewandowsky wrote: Reason for CRM: Was exposed to flu and having some symptoms, need to be tested, feeling weak and have a cough, no appetite please call [phone]

## 2024-01-11 ENCOUNTER — Encounter: Payer: 59 | Attending: Physical Medicine and Rehabilitation | Admitting: Physical Medicine and Rehabilitation

## 2024-01-11 ENCOUNTER — Encounter: Payer: Self-pay | Admitting: Physical Medicine and Rehabilitation

## 2024-01-11 VITALS — BP 123/70 | HR 92 | Ht 66.0 in | Wt 196.0 lb

## 2024-01-11 DIAGNOSIS — M7918 Myalgia, other site: Secondary | ICD-10-CM | POA: Insufficient documentation

## 2024-01-11 DIAGNOSIS — Z5181 Encounter for therapeutic drug level monitoring: Secondary | ICD-10-CM | POA: Diagnosis not present

## 2024-01-11 DIAGNOSIS — Z79891 Long term (current) use of opiate analgesic: Secondary | ICD-10-CM | POA: Insufficient documentation

## 2024-01-11 DIAGNOSIS — G894 Chronic pain syndrome: Secondary | ICD-10-CM | POA: Insufficient documentation

## 2024-01-11 DIAGNOSIS — M5412 Radiculopathy, cervical region: Secondary | ICD-10-CM | POA: Insufficient documentation

## 2024-01-11 DIAGNOSIS — M5416 Radiculopathy, lumbar region: Secondary | ICD-10-CM | POA: Insufficient documentation

## 2024-01-11 DIAGNOSIS — Z7409 Other reduced mobility: Secondary | ICD-10-CM | POA: Diagnosis not present

## 2024-01-11 DIAGNOSIS — M7062 Trochanteric bursitis, left hip: Secondary | ICD-10-CM | POA: Insufficient documentation

## 2024-01-11 DIAGNOSIS — Z789 Other specified health status: Secondary | ICD-10-CM | POA: Diagnosis not present

## 2024-01-11 MED ORDER — NALOXONE HCL 4 MG/0.1ML NA LIQD: 1.0000 | Freq: Once | NASAL | 0 refills | Status: AC

## 2024-01-11 MED ORDER — TRIAMCINOLONE ACETONIDE 40 MG/ML IJ SUSP
40.0000 mg | Freq: Once | INTRAMUSCULAR | Status: AC
  Administered 2024-01-11: 40 mg via INTRA_ARTICULAR

## 2024-01-11 MED ORDER — HYDROCODONE-ACETAMINOPHEN 5-325 MG PO TABS: 1.0000 | ORAL_TABLET | Freq: Three times a day (TID) | ORAL | 0 refills | Status: DC | PRN

## 2024-01-11 MED ORDER — LIDOCAINE HCL 1 % IJ SOLN
3.0000 mL | Freq: Once | INTRAMUSCULAR | Status: AC
  Administered 2024-01-11: 3 mL

## 2024-01-11 NOTE — Patient Instructions (Addendum)
-   Resume Usual Activities. Notify Physician of any unusual bleeding, erythema or concern for side effects as reviewed above. - Apply ice prn for pain - Tylenol  prn for pain  - We can repeat left greater trochanteric bursa injections as often as every 3 months if beneficial   Stop gabapentin  due to no effect.   Follow-up with my nurse practitioner Fidela in 1 month.  If pain is well-controlled on your current regimen, you will see her every other month and me every 6 months.  If not, we will follow-up more frequently.  Feel free to use MyChart between appointments to discuss any acute issues, making usually get back to within 48 hours.   I am prescribing 1 month of norco 5 mg three times daily as needed for pain control. I have also sent a script for narcan  as needed for overdose. Further refills per follow up with Fidela.

## 2024-01-11 NOTE — Progress Notes (Signed)
 HPI: Monica [name] is a 53 y.o. female with PMHx has Primary malignant neoplasm of upper outer quadrant of female breast, right (HCC); Family history of breast cancer; Genetic testing; Port-A-Cath in place; Trochanteric bursitis of left hip; Muscle cramp, nocturnal; Spondylolisthesis at L5-S1 level; Lumbar radiculopathy; Primary osteoarthritis, left shoulder; Depression, recurrent (HCC); Cervical radiculopathy; Difficulty sleeping; Moderate persistent asthma; Hypertension; Cervical cancer screening; Obesity (BMI 30-39.9); Myofascial pain on left side; Chronic pain syndrome; and Impaired mobility and ADLs on their problem list. who presents to clinic for treatment of pain related to left greater trochanteric bursitis  via injection as described below.   From prior visit:  Chronic pain syndrome Myofascial pain on left side Impaired mobility and ADLs   Follow up with me in 2 weeks for left greater trochanteric bursa injection; at that time if pain is not better controlled with increased gabapentin , we will do a pain contract and drug screen and resume low dose norco 5 mg three times daily as needed.    I gave you a form for handicapped placcard today. Continue home aide for R arm pain and weakness.    Cervical radiculopathy Increase gabapentin  to 300 mg in the morning, 300 mg in the afternoon, and 600 mg at nighttime for sleep and nerve pain   I agree with Dr. Eldonna for allowing time for cervical ESI to work   Continue home exercises from PT   Offered trigger point injections, patient defers due to poor results in the past.    Lumbar radiculopathy You may also benefit from ESIs in the low back for lumbar radicular pains; advised patient to discuss with Dr. Eldonna    MRI L spine 2023 showed:   1. 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 There is resulting moderate to severe foraminal narrowing, left greater than right,  with probable bilateral L5 nerve root encroachment.   2. Broad-based central disc protrusion at L4-5 contributes to mild lateral recess narrowing, but no definite nerve root encroachment.   3. No other significant spinal stenosis or nerve root encroachment. Mild disc degeneration at the additional levels as detailed above.   Trochanteric bursitis of left hip Follow up ion 2 weeks for L GTB injection     Other orders -     Gabapentin ; Take 1 capsule (300 mg total) by mouth 2 (two) times daily with breakfast and lunch AND 2 capsules (600 mg total) at bedtime. Start with 1 capsule at nighttime, and can increase up to 3 times daily.  Dispense: 120 capsule; Refill: 2   Interval Hx:   She is taking 2 capsules 3 times daily; not really noticing a difference yet. She initially had a hard time getting it from the pharmacy initially, has been on this dose about a week. She denies any side effects. She states she failed it in the past for neuropathy due to no effect.   She notes she has been taking tylenol  because of getting the flu recently - last Tuesday symptoms resolved. No longer on tylenol  for myalgias.   Has not talked to Dr. Eldonna about LESI yet.   Physical Exam:  General: Appropriate appearance for age.  Mental Status: Appropriate mood and affect.  Cardiovascular: RRR, no m/r/g.  Respiratory: CTAB, no rales/rhonchi/wheezing.  Skin: No apparent rashes or lesions.  Neuro: Awake, alert, and oriented x3. No apparent deficits.  MSK:  Moving all 4 limbs antigravity and against resistance. + TTP L GTB,  especially over middle insertion site.   PROCEDURE:  Left   greater trochanteric bursa injection Diagnosis:  6. Trochanteric bursitis of left hip  M70.62     7. Myofascial pain on left side  M79.18     8. Impaired mobility and ADLs  Z74.09    Z78.9     Goals with treatment: [ x ] Decrease pain [ x ] Improve Active / Passive ROM [ x ] Improve ADLs [ x ] Improve functional  mobility  MEDICATION:  [ x ] Kenalog  40 mg/mL  [ X ] Lidocaine  1%    CONSENT: Obtained in writing per policy. Consent uploaded to chart.  Benefits discussed.  Risks discussed included, but were not limited to, pain and discomfort, bleeding, bruising, allergic reaction, infection. All questions answered to patient/family member/guardian/ caregiver satisfaction. They would like to proceed with procedure. There are no noted contraindications to procedure.  PROCEDURE Time out was preformed No heat sources No antibiotics  The patient was explained about both the benefits and risks of a Left  Greater trochanteric bursa injection. After the patient acknowledged an understanding of the risks and benefits, the patient agreed to proceed. The area was first marked and then prepped in an aseptic fashion with betadine  / alcohol. A 25 g, 1.5 inch needle was directed via a direct approach into the Left   lateral hip over the greater trochanter as above . The injection was completed with Kenalog  40 mg/ml 1 cc mixed with 3 cc of 1% lidocaine  after no blood was aspirated on pull back.  No complications were encountered. The patient tolerated the procedure well.  Assessment and plan:  Monica [name] is a 53 y.o. year old female  who  has a past medical history of Arthritis, Asthma, Back pain, Breast cancer (HCC), Buttock pain, Dyspnea, Family history of breast cancer (09/22/2020), Hypertension, Multiple lung nodules on CT (2022), Personal history of chemotherapy, and Personal history of radiation therapy.   They are presenting to PM&R clinic as a follow up for L shoulder pain, d/t OA s/p reverse shoulder arthroplasty 2023, along with L C6 cervical radiculopathy and L lumbar radiculopathy; also for L GTB injection today for ongoing hip pain.   Chronic pain syndrome Encounter for medication monitoring Encounter for long-term opiate analgesic use Indication for chronic opioid: L shoulder pain, d/t OA  s/p reverse shoulder arthroplasty 2023, along with L C6 cervical radiculopathy and L lumbar radiculopathy   Medication and dose: Norco 5 mg TID PRN # pills per month: 90 Last UDS date: 01/11/24 Opioid Treatment Agreement signed (Y/N): Y Opioid Treatment Agreement last reviewed with patient:  01/11/24 NCCSRS/PDMP reviewed this encounter (include red flags): Yes   REMS opioid patient information sheet provided outlining risks of opioid use, possible medication interactions, and use of narcan  for overdose.   Follow-up with my nurse practitioner Fidela in 1 month.  If pain is well-controlled on your current regimen, you will see her every other month and me every 6 months.  If not, we will follow-up more frequently.  Feel free to use MyChart between appointments to discuss any acute issues, making usually get back to within 48 hours.   I am prescribing 1 month of norco 5 mg three times daily as needed for pain control. I have also sent a script for narcan  as needed for overdose. Further refills per follow up with Fidela.   Cervical radiculopathy Lumbar radiculopathy LUE EMG from 9/26, L C6 radiculopathy and myopathic changes in L  deltoid muscle likely post-op.   Stop gabapentin  due to no effect.   Patient to make follow up with Dr. Eldonna for CESI, and discuss LESI  Trochanteric bursitis of left hip Myofascial pain on left side Impaired mobility and ADLs - Resume Usual Activities. Notify Physician of any unusual bleeding, erythema or concern for side effects as reviewed above. - Apply ice prn for pain - Tylenol  prn for pain  - We can repeat left greater trochanteric bursa injections as often as every 3 months if beneficial      Patient/Care Giver was ready to learn without apparent learning barriers. Education was provided on diagnosis, treatment options/plan according to patient's preferred learning style. Patient/Care Giver verbalized understanding and agreement with the above  plan.   Joesph JAYSON Likes, DO 01/11/2024

## 2024-01-13 LAB — TOXASSURE SELECT,+ANTIDEPR,UR

## 2024-01-13 NOTE — Progress Notes (Signed)
 Results consistent with prescribed diazepam  and clonazepam.

## 2024-01-27 ENCOUNTER — Other Ambulatory Visit: Payer: Self-pay | Admitting: Family Medicine

## 2024-01-31 ENCOUNTER — Ambulatory Visit: Payer: Medicare Other | Admitting: Family Medicine

## 2024-02-07 NOTE — Progress Notes (Unsigned)
 Subjective:    Patient ID: Monica [name], female    DOB: 1971-04-12, 53 y.o.   MRN: [id number]  HPI: Monica [name] is a 53 y.o. female who returns for follow up appointment for chronic pain and medication refill. She states her pain is located in her lower back radiating into her left hip and left lower extremity. She rates her pain 7. Her current exercise regime is walking and performing stretching exercises.  Monica [name] Morphine equivalent is 15.00 MME.   Last UDS was Performed on 01/11/2024, it was consistent.      Pain Inventory Average Pain 10 Pain Right Now 7 My pain is constant, sharp, and Numb  In the last 24 hours, has pain interfered with the following? General activity 10 Relation with others 0 Enjoyment of life 10 What TIME of day is your pain at its worst? morning , daytime, evening, and night Sleep (in general) Poor  Pain is worse with: walking, bending, sitting, standing, and some activites Pain improves with: medication and heat Relief from Meds: 10  Family History  Problem Relation Age of Onset   Hypertension Father    Hypertension Sister    Hypertension Maternal Grandmother    Breast cancer Maternal Aunt 41   Breast cancer Paternal Aunt        dx at unknown age   Breast cancer Other        MGM's niece; dx 70s   Breast cancer Other        MGF's niece   Social History   Socioeconomic History   Marital status: Single    Spouse name: Not on file   Number of children: Not on file   Years of education: Not on file   Highest education level: Not on file  Occupational History   Not on file  Tobacco Use   Smoking status: Former    Current packs/day: 0.00    Average packs/day: 0.5 packs/day for 35.0 years (17.5 ttl pk-yrs)    Types: Cigarettes    Start date: 08/22/1985    Quit date: 08/22/2020    Years since quitting: 3.4   Smokeless tobacco: Never  Vaping Use   Vaping status: Never Used  Substance and Sexual Activity    Alcohol use: No   Drug use: Never   Sexual activity: Not Currently    Birth control/protection: Surgical  Other Topics Concern   Not on file  Social History Narrative   Not on file   Social Drivers of Health   Financial Resource Strain: Not on file  Food Insecurity: Not on file  Transportation Needs: Not on file  Physical Activity: Not on file  Stress: Not on file  Social Connections: Not on file   Past Surgical History:  Procedure Laterality Date   ABDOMINAL HYSTERECTOMY     APPENDECTOMY     BREAST LUMPECTOMY WITH RADIOACTIVE SEED AND SENTINEL LYMPH NODE BIOPSY Right 09/10/2020   Procedure: RIGHT BREAST LUMPECTOMY WITH RADIOACTIVE SEED AND SENTINEL LYMPH NODE MAPPING;  Surgeon: Harriette Bouillon, MD;  Location: [facility];  Service: General;  Laterality: Right;   COLONOSCOPY WITH PROPOFOL N/A 10/21/2023   Procedure: COLONOSCOPY WITH PROPOFOL;  Surgeon: Lanelle Bal, DO;  [facility];  Service: Endoscopy;  Laterality: N/A;  11:00am, asa 3   POLYPECTOMY  10/21/2023   Procedure: POLYPECTOMY INTESTINAL;  Surgeon: Lanelle Bal, DO;  Location: [facility];  Service: Endoscopy;;   PORTACATH PLACEMENT Right 10/16/2020  Procedure: INSERTION PORT-A-CATH WITH ULTRASOUND GUIDANCE;  Surgeon: Harriette Bouillon, MD;  Location: [facility];  Service: General;  Laterality: Right;   RE-EXCISION OF BREAST LUMPECTOMY Right 10/16/2020   Procedure: RE-EXCISION OF RIGHT BREAST LUMPECTOMY;  Surgeon: Harriette Bouillon, MD;  Location: [facility];  Service: General;  Laterality: Right;   REVERSE SHOULDER ARTHROPLASTY Left 11/16/2022   Procedure: LEFT REVERSE SHOULDER ARTHROPLASTY;  Surgeon: Huel Cote, MD;  Location: [facility];  Service: Orthopedics;  Laterality: Left;   Past Surgical History:  Procedure Laterality Date   ABDOMINAL HYSTERECTOMY     APPENDECTOMY     BREAST LUMPECTOMY WITH RADIOACTIVE SEED AND SENTINEL  LYMPH NODE BIOPSY Right 09/10/2020   Procedure: RIGHT BREAST LUMPECTOMY WITH RADIOACTIVE SEED AND SENTINEL LYMPH NODE MAPPING;  Surgeon: Harriette Bouillon, MD;  Location: [facility];  Service: General;  Laterality: Right;   COLONOSCOPY WITH PROPOFOL N/A 10/21/2023   Procedure: COLONOSCOPY WITH PROPOFOL;  Surgeon: Lanelle Bal, DO;  [facility];  Service: Endoscopy;  Laterality: N/A;  11:00am, asa 3   POLYPECTOMY  10/21/2023   Procedure: POLYPECTOMY INTESTINAL;  Surgeon: Lanelle Bal, DO;  [facility];  Service: Endoscopy;;   PORTACATH PLACEMENT Right 10/16/2020   Procedure: INSERTION PORT-A-CATH WITH ULTRASOUND GUIDANCE;  Surgeon: Harriette Bouillon, MD;  Location: [facility];  Service: General;  Laterality: Right;   RE-EXCISION OF BREAST LUMPECTOMY Right 10/16/2020   Procedure: RE-EXCISION OF RIGHT BREAST LUMPECTOMY;  Surgeon: Harriette Bouillon, MD;  Location: [facility];  Service: General;  Laterality: Right;   REVERSE SHOULDER ARTHROPLASTY Left 11/16/2022   Procedure: LEFT REVERSE SHOULDER ARTHROPLASTY;  Surgeon: Huel Cote, MD;  [facility];  Service: Orthopedics;  Laterality: Left;   Past Medical History:  Diagnosis Date   Arthritis    Asthma    Back pain    Breast cancer (HCC)    Buttock pain    Dyspnea    on exertion   Family history of breast cancer 09/22/2020   Hypertension    Multiple lung nodules on CT 2022   Personal history of chemotherapy    Personal history of radiation therapy    There were no vitals taken for this visit.  Opioid Risk Score:   Fall Risk Score:  `1  Depression screen [facility] 2/9     09/30/2023   10:14 AM 09/30/2023   10:05 AM 09/15/2023   10:33 AM 09/05/2023    3:27 PM 07/25/2023    9:26 AM 05/23/2023    9:32 AM 03/21/2023    9:40 AM  Depression screen PHQ 2/9  Decreased Interest 0 0 1 2 1 1 1   Down, Depressed, Hopeless 0 0 1 2 1 1 1   PHQ - 2 Score  0 0 2 4 2 2 2   Altered sleeping 0 0 2    3  Tired, decreased energy 0 0 1    1  Change in appetite 0 0 1    0  Feeling bad or failure about yourself  0 0 1    1  Trouble concentrating 0 0 2    1  Moving slowly or fidgety/restless 0 0 1    0  Suicidal thoughts 0 0 1    0  PHQ-9 Score 0 0 11    8  Difficult doing work/chores Not difficult at all Not difficult at all Somewhat difficult    Somewhat difficult  Review of Systems  Musculoskeletal:        Left lower arm, left hip, left foot pain  All other systems reviewed and are negative.      Objective:   Physical Exam Vitals and nursing note reviewed.  Constitutional:      Appearance: Normal appearance.  Cardiovascular:     Rate and Rhythm: Normal rate and regular rhythm.     Pulses: Normal pulses.     Heart sounds: Normal heart sounds.  Pulmonary:     Breath sounds: Wheezing and rhonchi present.  Musculoskeletal:     Comments: Normal Muscle Bulk and Muscle Testing Reveals:  Upper Extremities: Right : Full ROM and Muscle Strength 5/5 Left Upper Extremity: Decreased ROM 90 Degrees and Muscle Strength 4/5 Lumbar Paraspinal Tenderness: L-4-L-5 Mainly Left Side Left Greater Trochanter Tenderness Lower Extremities: Full ROM and Muscle Strength 5/5 Arises from Chair with ease Narrow Based  Gait       Skin:    General: Skin is warm and dry.  Neurological:     Mental Status: She is alert and oriented to person, place, and time.  Psychiatric:        Mood and Affect: Mood normal.        Behavior: Behavior normal.          Assessment & Plan:  Left Lumbar Radiculitis: Gabapentin was discontinued due to being ineffective. Continue HEP as Tolerated. Continue to Monitor.  Left Greater Trochanter Tenderness: Scheduled for Hip Injection with Dr Shearon Stalls. Continue HEP as Tolerated. Continue to Monitor.  Chronic Pain Syndrome: Continue Hydrocodone 5/325 mg one tablet three times a day as needed for pain. Ms. Tiegs reports she uses  her Hydrocodone sparingly. We will continue the opioid monitoring program, this consists of regular clinic visits, examinations, urine drug screen, pill counts as well as use of [place] Controlled Substance Reporting system. A 12 month History has been reviewed on the [place] Controlled Substance Reporting System on 02/08/2024. She will call office when she needs a refill. She verbalizes understanding.   F/U in 2 months

## 2024-02-08 ENCOUNTER — Encounter: Payer: 59 | Attending: Physical Medicine and Rehabilitation | Admitting: Registered Nurse

## 2024-02-08 ENCOUNTER — Encounter: Payer: Self-pay | Admitting: Registered Nurse

## 2024-02-08 VITALS — BP 119/83 | HR 78 | Ht 66.0 in | Wt 201.0 lb

## 2024-02-08 DIAGNOSIS — M5416 Radiculopathy, lumbar region: Secondary | ICD-10-CM | POA: Diagnosis not present

## 2024-02-08 DIAGNOSIS — Z5181 Encounter for therapeutic drug level monitoring: Secondary | ICD-10-CM | POA: Diagnosis not present

## 2024-02-08 DIAGNOSIS — Z79891 Long term (current) use of opiate analgesic: Secondary | ICD-10-CM | POA: Insufficient documentation

## 2024-02-08 DIAGNOSIS — G894 Chronic pain syndrome: Secondary | ICD-10-CM | POA: Insufficient documentation

## 2024-02-08 DIAGNOSIS — M7062 Trochanteric bursitis, left hip: Secondary | ICD-10-CM | POA: Insufficient documentation

## 2024-02-08 NOTE — Patient Instructions (Signed)
 Call office when you need a refill on your Hydrocodone.  Call when you have 10 tablets left.   [phone]

## 2024-02-09 ENCOUNTER — Ambulatory Visit (INDEPENDENT_AMBULATORY_CARE_PROVIDER_SITE_OTHER): Payer: Medicare Other | Admitting: Family Medicine

## 2024-02-09 ENCOUNTER — Ambulatory Visit (HOSPITAL_COMMUNITY)
Admission: RE | Admit: 2024-02-09 | Discharge: 2024-02-09 | Disposition: A | Discharge status: Home / Self Care | Source: Ambulatory Visit | Attending: Family Medicine | Admitting: Family Medicine

## 2024-02-09 VITALS — BP 137/81 | HR 94 | Temp 99.5°F | Resp 16 | Ht 64.0 in | Wt 199.0 lb

## 2024-02-09 DIAGNOSIS — R059 Cough, unspecified: Secondary | ICD-10-CM | POA: Diagnosis not present

## 2024-02-09 DIAGNOSIS — R0989 Other specified symptoms and signs involving the circulatory and respiratory systems: Secondary | ICD-10-CM

## 2024-02-09 DIAGNOSIS — I10 Essential (primary) hypertension: Secondary | ICD-10-CM | POA: Diagnosis not present

## 2024-02-09 DIAGNOSIS — B349 Viral infection, unspecified: Secondary | ICD-10-CM | POA: Diagnosis not present

## 2024-02-09 MED ORDER — PREDNISONE 20 MG PO TABS: 40.0000 mg | ORAL_TABLET | Freq: Every day | ORAL | 0 refills | Status: AC

## 2024-02-09 MED ORDER — AZITHROMYCIN 250 MG PO TABS: ORAL_TABLET | ORAL | 0 refills | Status: AC

## 2024-02-09 MED ORDER — PROMETHAZINE-DM 6.25-15 MG/5ML PO SYRP: 5.0000 mL | ORAL_SOLUTION | Freq: Four times a day (QID) | ORAL | 0 refills | Status: DC | PRN

## 2024-02-09 NOTE — Progress Notes (Signed)
 Established Patient Office Visit  Subjective:  Patient ID: Monica [name], female    DOB: Jul 23, 1971  Age: 53 y.o. MRN: [id number]  CC:  Chief Complaint  Patient presents with   Cough    Started Monday with cough and chest congestion, sinus congestion. Has been using mucinex without relief, hurts in her ribs when she coughs. No covid test taken. Did have flu a few weeks ago already    HPI Monica [name] is a 53 y.o. female with past medical history of hypertension, moderate persistent asthma presents for f/u of  chronic medical conditions.  Hypertension: The patient is compliant with lisinopril 10 mg daily. She is asymptomatic in the clinic and reports adherence to treatment.  Viral Illness: The patient reports having the flu a week ago and being treated appropriately. However, after completing therapy, she continues to experience a persistent cough, chest congestion, and sinus congestion. She reports a low-grade fever and a mild scratchy throat. She has been taking Mucinex with minimal relief.   Past Medical History:  Diagnosis Date   Arthritis    Asthma    Back pain    Breast cancer (HCC)    Buttock pain    Dyspnea    on exertion   Family history of breast cancer 09/22/2020   Hypertension    Multiple lung nodules on CT 2022   Personal history of chemotherapy    Personal history of radiation therapy     Past Surgical History:  Procedure Laterality Date   ABDOMINAL HYSTERECTOMY     APPENDECTOMY     BREAST LUMPECTOMY WITH RADIOACTIVE SEED AND SENTINEL LYMPH NODE BIOPSY Right 09/10/2020   Procedure: RIGHT BREAST LUMPECTOMY WITH RADIOACTIVE SEED AND SENTINEL LYMPH NODE MAPPING;  Surgeon: Harriette Bouillon, MD;  Location: [facility];  Service: General;  Laterality: Right;   COLONOSCOPY WITH PROPOFOL N/A 10/21/2023   Procedure: COLONOSCOPY WITH PROPOFOL;  Surgeon: Lanelle Bal, DO;  [facility]: [facility];  Service: Endoscopy;  Laterality:  N/A;  11:00am, asa 3   POLYPECTOMY  10/21/2023   Procedure: POLYPECTOMY INTESTINAL;  Surgeon: Lanelle Bal, DO;  Location: [facility];  Service: Endoscopy;;   PORTACATH PLACEMENT Right 10/16/2020   Procedure: INSERTION PORT-A-CATH WITH ULTRASOUND GUIDANCE;  Surgeon: Harriette Bouillon, MD;  Location: [facility];  Service: General;  Laterality: Right;   RE-EXCISION OF BREAST LUMPECTOMY Right 10/16/2020   Procedure: RE-EXCISION OF RIGHT BREAST LUMPECTOMY;  Surgeon: Harriette Bouillon, MD;  Location: [facility];  Service: General;  Laterality: Right;   REVERSE SHOULDER ARTHROPLASTY Left 11/16/2022   Procedure: LEFT REVERSE SHOULDER ARTHROPLASTY;  Surgeon: Huel Cote, MD;  Location:  SUR[facility]ERY C[facility];  Service: Orthopedics;  Laterality: Left;    Family History  Problem Relation Age of Onset   Hypertension Father    Hypertension Sister    Hypertension Maternal Grandmother    Breast cancer Maternal Aunt 22   Breast cancer Paternal Aunt        dx at unknown age   Breast cancer Other        MGM's niece; dx 36s   Breast cancer Other        MGF's niece    Social History   Socioeconomic History   Marital status: Single    Spouse name: Not on file   Number of children: Not on file   Years of education: Not on file   Highest education level: Not on file  Occupational History  Not on file  Tobacco Use   Smoking status: Former    Current packs/day: 0.00    Average packs/day: 0.5 packs/day for 35.0 years (17.5 ttl pk-yrs)    Types: Cigarettes    Start date: 08/22/1985    Quit date: 08/22/2020    Years since quitting: 3.4   Smokeless tobacco: Never  Vaping Use   Vaping status: Never Used  Substance and Sexual Activity   Alcohol use: No   Drug use: Never   Sexual activity: Not Currently    Birth control/protection: Surgical  Other Topics Concern   Not on file  Social History Narrative   Not on file   Social Drivers of Health    Financial Resource Strain: Not on file  Food Insecurity: Not on file  Transportation Needs: Not on file  Physical Activity: Not on file  Stress: Not on file  Social Connections: Not on file  Intimate Partner Violence: Not on file    Outpatient Medications Prior to Visit  Medication Sig Dispense Refill   ADVAIR [facility] 115-21 MCG/ACT inhaler Inhale 2 puffs into the lungs 2 (two) times daily.     albuterol (PROVENTIL) (2.5 MG/3ML) 0.083% nebulizer solution Take 3 mLs (2.5 mg total) by nebulization every 6 (six) hours as needed for wheezing or shortness of breath. 75 mL 1   albuterol (VENTOLIN HFA) 108 (90 Base) MCG/ACT inhaler Inhale 2 puffs into the lungs every 4 (four) hours as needed for wheezing or shortness of breath. 8 g 3   calcium citrate (CALCITRATE - DOSED IN MG ELEMENTAL CALCIUM) 950 (200 Ca) MG tablet Take 200 mg of elemental calcium by mouth daily.     clonazePAM (KLONOPIN) 0.5 MG tablet Take 0.5 mg by mouth daily as needed.     diazepam (VALIUM) 5 MG tablet Take one tablet by mouth with food one hour prior to procedure. May repeat 30 minutes prior if needed. 2 tablet 0   escitalopram (LEXAPRO) 20 MG tablet Take by mouth.     HYDROcodone-acetaminophen (NORCO) 5-325 MG tablet Take 1 tablet by mouth 3 (three) times daily as needed for moderate pain (pain score 4-6). 90 tablet 0   letrozole (FEMARA) 2.5 MG tablet Take 1 tablet (2.5 mg total) by mouth daily. 90 tablet 3   lisinopril (ZESTRIL) 10 MG tablet Take 1 tablet (10 mg total) by mouth daily. 90 tablet 0   prazosin (MINIPRESS) 1 MG capsule take 1 capsule by mouth daily; for blood pressure 30 capsule 1   temazepam (RESTORIL) 15 MG capsule Take 15 mg by mouth at bedtime as needed for sleep.     No facility-administered medications prior to visit.    Allergies  Allergen Reactions   Oxycodone Itching and Swelling    ROS Review of Systems  Constitutional:  Negative for chills and fever.  HENT:  Positive for congestion and  sore throat. Negative for sneezing.   Eyes:  Negative for visual disturbance.  Respiratory:  Positive for cough. Negative for chest tightness and shortness of breath.   Neurological:  Negative for dizziness and headaches.      Objective:    Physical Exam HENT:     Head: Normocephalic.     Mouth/Throat:     Mouth: Mucous membranes are moist.  Cardiovascular:     Rate and Rhythm: Normal rate.     Heart sounds: Normal heart sounds.  Pulmonary:     Effort: Pulmonary effort is normal.     Breath sounds: Normal breath sounds.  Neurological:     Mental Status: She is alert.     BP 137/81   Pulse 94   Temp 99.5 F (37.5 C) (Oral)   Resp 16   Ht 5\' 4"  (1.626 m)   Wt 199 lb (90.3 kg)   SpO2 94%   BMI 34.16 kg/m  Wt Readings from Last 3 Encounters:  02/09/24 199 lb (90.3 kg)  02/08/24 201 lb (91.2 kg)  01/11/24 196 lb (88.9 kg)    Lab Results  Component Value Date   TSH 1.560 08/31/2023   Lab Results  Component Value Date   WBC 7.0 08/31/2023   HGB 13.2 08/31/2023   HCT 41.6 08/31/2023   MCV 85 08/31/2023   PLT 228 08/31/2023   Lab Results  Component Value Date   NA 142 08/31/2023   K 4.1 08/31/2023   CO2 22 08/31/2023   GLUCOSE 87 08/31/2023   BUN 7 08/31/2023   CREATININE 0.85 08/31/2023   BILITOT 0.5 08/31/2023   ALKPHOS 141 (H) 08/31/2023   AST 33 08/31/2023   ALT 25 08/31/2023   PROT 7.0 08/31/2023   ALBUMIN 4.3 08/31/2023   CALCIUM 9.8 08/31/2023   ANIONGAP 9 07/31/2022   EGFR 82 08/31/2023   Lab Results  Component Value Date   CHOL 166 08/31/2023   Lab Results  Component Value Date   HDL 52 08/31/2023   Lab Results  Component Value Date   LDLCALC 98 08/31/2023   Lab Results  Component Value Date   TRIG 83 08/31/2023   Lab Results  Component Value Date   CHOLHDL 3.2 08/31/2023   Lab Results  Component Value Date   HGBA1C 5.5 08/31/2023      Assessment & Plan:  Primary hypertension Assessment & Plan: Controlled Encouraged  to continue taking lisinopril 10 mg daily as prescribed A low-sodium diet of less than 2,300 mg daily is recommended, along with moderate-intensity physical activity for at least 150 minutes per week. The patient is encouraged to maintain these lifestyle modifications to help manage her blood pressure effectively.  Long-term considerations were discussed, emphasizing that uncontrolled hypertension increases the risk of cardiovascular diseases, including stroke, coronary artery disease, and heart failure.  The patient is encouraged to seek emergency care if blood pressure exceeds 180/120 and is accompanied by symptoms such as headaches, chest pain, palpitations, blurred vision, or dizziness. She verbalized understanding and will follow up as scheduled.  BP Readings from Last 3 Encounters:  02/09/24 137/81  02/08/24 119/83  01/11/24 123/70      Viral illness Assessment & Plan: Symptoms are likely due to viral bronchitis, but an X-ray will be obtained to rule out pneumonia. Will treat with azithromycin and prednisone and encourage completing the full course of the treatment regimen. Prescribed promethazine DM for cough. Encouraged to increase hydration, rest, take Tylenol as needed for body aches and headaches, and perform warm salt gargles 3 to 4 times daily for a sore throat. Advised to follow up if symptoms do not improve or worsen. The patient verbalized understanding and is aware of the plan of care.   Orders: -     DG Chest 2 View -     Azithromycin; Take 2 tablets on day 1, then 1 tablet daily on days 2 through 5  Dispense: 6 tablet; Refill: 0 -     predniSONE; Take 2 tablets (40 mg total) by mouth daily for 5 days.  Dispense: 10 tablet; Refill: 0 -     Promethazine-DM; Take  5 mLs by mouth 4 (four) times daily as needed.  Dispense: 118 mL; Refill: 0  Chest congestion -     DG Chest 2 View   Note: This chart has been completed using Engineer, civil (consulting) software, and while  attempts have been made to ensure accuracy, certain words and phrases may not be transcribed as intended.   Follow-up: Return in about 4 months (around 06/10/2024).   Gilmore Laroche, FNP

## 2024-02-09 NOTE — Patient Instructions (Addendum)
 I appreciate the opportunity to provide care to you today!    Follow up:  4 months  Post-Viral Bronchitis  Please complete the full course of the prescribed antibiotic. A prescription for Prednisone 40 mg daily for five days has been sent to your pharmacy. I recommend stopping by [facility] for a chest X-ray to rule out pneumonia. Hydration: Increase fluid intake to at least 64 ounces daily. Cough Management: Take Promethazine DM 5 mL by mouth every 4 hours as needed for cough and cold symptoms. Symptom Relief: Fever/Pain: Take Tylenol as needed for pain, fever, or general discomfort. Throat Care: Perform warm saltwater gargles 3-4 times daily (mix  teaspoon of salt in a glass of warm water). Natural Remedies: Drink ginger tea to reduce throat irritation and nausea. Use sugar-free or honey-based throat lozenges to help with a sore throat. Environmental Support: Use a humidifier at bedtime to ease cough and nasal congestion. For nasal congestion, heated humidified air and saline nasal sprays may provide relief. Cough Management: Honey can help reduce cough frequency and severity. Rest and Recovery: Allow for plenty of rest to support healing. Follow-up if your symptoms do not improve or worsen.     Please continue to a heart-healthy diet and increase your physical activities. Try to exercise for at least five days a week.    It was a pleasure to see you and I look forward to continuing to work together on your health and well-being. Please do not hesitate to call the office if you need care or have questions about your care.  In case of emergency, please visit the Emergency Department for urgent care, or contact our clinic at [phone] to schedule an appointment. We're here to help you!   Have a wonderful day and week. With Gratitude, Gilmore Laroche MSN, FNP-BC

## 2024-02-12 DIAGNOSIS — B349 Viral infection, unspecified: Secondary | ICD-10-CM | POA: Insufficient documentation

## 2024-02-12 NOTE — Assessment & Plan Note (Signed)
 Controlled Encouraged to continue taking lisinopril 10 mg daily as prescribed A low-sodium diet of less than 2,300 mg daily is recommended, along with moderate-intensity physical activity for at least 150 minutes per week. The patient is encouraged to maintain these lifestyle modifications to help manage her blood pressure effectively.  Long-term considerations were discussed, emphasizing that uncontrolled hypertension increases the risk of cardiovascular diseases, including stroke, coronary artery disease, and heart failure.  The patient is encouraged to seek emergency care if blood pressure exceeds 180/120 and is accompanied by symptoms such as headaches, chest pain, palpitations, blurred vision, or dizziness. She verbalized understanding and will follow up as scheduled.  BP Readings from Last 3 Encounters:  02/09/24 137/81  02/08/24 119/83  01/11/24 123/70

## 2024-02-12 NOTE — Assessment & Plan Note (Signed)
 Symptoms are likely due to viral bronchitis, but an X-ray will be obtained to rule out pneumonia. Will treat with azithromycin and prednisone and encourage completing the full course of the treatment regimen. Prescribed promethazine DM for cough. Encouraged to increase hydration, rest, take Tylenol as needed for body aches and headaches, and perform warm salt gargles 3 to 4 times daily for a sore throat. Advised to follow up if symptoms do not improve or worsen. The patient verbalized understanding and is aware of the plan of care.

## 2024-02-21 ENCOUNTER — Ambulatory Visit (HOSPITAL_COMMUNITY)
Admission: RE | Admit: 2024-02-21 | Discharge: 2024-02-21 | Disposition: A | Discharge status: Home / Self Care | Source: Ambulatory Visit | Attending: Pulmonary Disease | Admitting: Pulmonary Disease

## 2024-02-21 DIAGNOSIS — R918 Other nonspecific abnormal finding of lung field: Secondary | ICD-10-CM | POA: Diagnosis not present

## 2024-02-21 DIAGNOSIS — J849 Interstitial pulmonary disease, unspecified: Secondary | ICD-10-CM | POA: Insufficient documentation

## 2024-03-03 ENCOUNTER — Encounter: Payer: Self-pay | Admitting: Family Medicine

## 2024-03-28 ENCOUNTER — Ambulatory Visit (INDEPENDENT_AMBULATORY_CARE_PROVIDER_SITE_OTHER): Payer: 59 | Admitting: Pulmonary Disease

## 2024-03-28 ENCOUNTER — Encounter: Payer: Self-pay | Admitting: Pulmonary Disease

## 2024-03-28 VITALS — BP 110/72 | HR 78 | Temp 98.3°F | Ht 64.0 in | Wt 203.4 lb

## 2024-03-28 DIAGNOSIS — Z87891 Personal history of nicotine dependence: Secondary | ICD-10-CM

## 2024-03-28 DIAGNOSIS — R918 Other nonspecific abnormal finding of lung field: Secondary | ICD-10-CM | POA: Diagnosis not present

## 2024-03-28 DIAGNOSIS — J4540 Moderate persistent asthma, uncomplicated: Secondary | ICD-10-CM

## 2024-03-28 MED ORDER — BUDESONIDE-FORMOTEROL FUMARATE 160-4.5 MCG/ACT IN AERO: 2.0000 | INHALATION_SPRAY | Freq: Two times a day (BID) | RESPIRATORY_TRACT | 3 refills | Status: AC

## 2024-03-28 NOTE — Patient Instructions (Signed)
 VISIT SUMMARY:  Today, we discussed your moderate persistent asthma, lung nodules, smoking history, and breast cancer follow-up. We reviewed your recent breathing difficulties and the importance of maintaining your inhaler regimen. We also discussed the monitoring of your lung nodules and the follow-up care for your breast cancer.  YOUR PLAN:  -MODERATE PERSISTENT ASTHMA: Moderate persistent asthma is a long-term condition that causes inflammation and narrowing of the airways, leading to breathing difficulties. You experienced a recent exacerbation likely due to running out of your Symbicort  inhaler. We have reordered your Symbicort  inhaler from [facility]. Please use it daily, two puffs in the morning and two puffs in the evening. Contact the clinic if you run out of the inhaler to prevent exacerbations.  -LUNG NODULES: Lung nodules are small growths in the lungs that are usually benign. Your recent CT scan showed no significant changes in the size of your nodules. Due to your extensive smoking history, we will order an annual CT scan to monitor these nodules.  -SMOKING: You have a history of smoking two packs per day for nearly fifty years, which puts you at high risk for lung-related issues. It is important to continue monitoring your lung health closely.  -BREAST CANCER: You were diagnosed with breast cancer in 2021 and completed radiation therapy in April 2022. Given that it has been nearly three years since your radiation therapy, you are eligible for annual CT scans to monitor your health.  INSTRUCTIONS:  Please follow up with the clinic if you run out of your Symbicort  inhaler. We will also schedule an annual CT scan to monitor your lung nodules and follow up on your breast cancer status.

## 2024-03-28 NOTE — Progress Notes (Signed)
 Monica [name]    [id number]    05-Jun-1971  Primary Care Physician:Zarwolo, Art Bigness, FNP  Referring Physician: Zarwolo, [name], FNP [address] [place],  [place] [address]  Chief complaint: Follow-up for abnormal CT   HPI: 53 y.o. who  has a past medical history of Arthritis, Asthma, Back pain, Breast cancer (HCC), Buttock pain, Dyspnea, Family history of breast cancer (09/22/2020), Hypertension, Multiple lung nodules on CT (2022), Personal history of chemotherapy, and Personal history of radiation therapy.   Referred for evaluation of abnormal CT.  History notable for asthma which is not under control She has daily symptoms of dyspnea, wheezing, nocturnal awakenings 2-3 times every night.  She is currently on just albuterol  inhaler Increase symptoms on exposure to grass mowing, heat and humidity.  Has history of seasonal allergies, denies GERD symptoms  Diagnosed with breast cancer in September 2021 which is treated with lumpectomy, paclitaxel  and trastuzumab .  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  No feather pillows or comforters ILD exposure questionnaire 06/22/2022-negative Smoking history: 100 pack year smoker.  Quit smoking in 2023 Travel history: No significant travel history Relevant family history: No family history of lung disease  Interim history: Discussed the use of AI scribe software for clinical note transcription with the patient, who gave verbal consent to proceed.  History of Present Illness Monica [name] is a 53 year old female with moderate persistent asthma who presents for follow-up of her respiratory condition.  Approximately one month ago, she experienced difficulty breathing, which led to a hospital visit. Her symptoms were alleviated with medication, and the breathing issues were  attributed to allergies. She was not admitted to the hospital.  She has a history of moderate persistent asthma and was previously prescribed a Symbicort  inhaler, which she has run out of and has not been using. She is unsure about her current inhaler regimen but recalls being instructed to take two puffs in the morning and two in the evening. She has been without the inhaler for a while, and her pharmacy.  She quit smoking in 2020 after smoking two packs a day since she was 53 years old. She stopped smoking when she was diagnosed with breast cancer in 2021. She reports experiencing shortness of breath when walking, particularly in places like [employer], which she attributes to weight gain.  She was diagnosed with breast cancer in 2021 and underwent radiation therapy, which concluded in April 2022.    Outpatient Encounter Medications as of 03/28/2024  Medication Sig   ADVAIR HFA 115-21 MCG/ACT inhaler Inhale 2 puffs into the lungs 2 (two) times daily.   albuterol  (PROVENTIL ) (2.5 MG/3ML) 0.083% nebulizer solution Take 3 mLs (2.5 mg total) by nebulization every 6 (six) hours as needed for wheezing or shortness of breath.   albuterol  (VENTOLIN  HFA) 108 (90 Base) MCG/ACT inhaler Inhale 2 puffs into the lungs every 4 (four) hours as needed for wheezing or shortness of breath.   calcium citrate (CALCITRATE - DOSED IN MG ELEMENTAL CALCIUM) 950 (200 Ca) MG tablet Take 200 mg of elemental calcium by mouth daily.   clonazePAM (KLONOPIN) 0.5 MG tablet Take 0.5 mg by mouth daily as needed.   diazepam  (VALIUM ) 5 MG tablet Take one tablet by mouth with food one hour prior to procedure. May repeat 30 minutes prior  if needed.   escitalopram (LEXAPRO) 20 MG tablet Take by mouth.   HYDROcodone -acetaminophen  (NORCO) 5-325 MG tablet Take 1 tablet by mouth 3 (three) times daily as needed for moderate pain (pain score 4-6).   letrozole  (FEMARA ) 2.5 MG tablet Take 1 tablet (2.5 mg total) by mouth daily.   lisinopril   (ZESTRIL ) 10 MG tablet Take 1 tablet (10 mg total) by mouth daily.   prazosin (MINIPRESS) 1 MG capsule take 1 capsule by mouth daily; for blood pressure   temazepam (RESTORIL) 15 MG capsule Take 15 mg by mouth at bedtime as needed for sleep.   [DISCONTINUED] promethazine -dextromethorphan (PROMETHAZINE -DM) 6.25-15 MG/5ML syrup Take 5 mLs by mouth 4 (four) times daily as needed. (Patient not taking: Reported on 03/28/2024)   No facility-administered encounter medications on file as of 03/28/2024.    Physical Exam: Blood pressure 126/70, pulse 91, temperature 98 F (36.7 C), temperature source Oral, height 5' (1.524 m), weight 198 lb (89.8 kg), SpO2 96 %. Gen:      No acute distress HEENT:  EOMI, sclera anicteric Neck:     No masses; no thyromegaly Lungs:    Clear to auscultation bilaterally; normal respiratory effort CV:         Regular rate and rhythm; no murmurs Abd:      + bowel sounds; soft, non-tender; no palpable masses, no distension Ext:    No edema; adequate peripheral perfusion Skin:      Warm and dry; no rash Neuro: alert and oriented x 3 Psych: normal mood and affect   Data Reviewed: Imaging: CT chest 02/15/2022-mild bronchial wall thickening with mosaic attenuation, scattered pulmonary nodules.  CT chest 05/21/2022-progressive mosaic attenuation, stable pulmonary nodules.    High-resolution CT 08/11/2022-numerous small bilateral pulmonary nodules which are stable.  Air trapping.  Mild bronchiectasis.  CT chest 02/16/2023-stable bilateral pulmonary nodules  High resolution CT 02/21/2024-no evidence of interstitial lung disease.  Air trapping, pulmonary nodules less than 5 mm I have reviewed the images personally.  PFTs: 08/20/2022 FVC 2.27 (61%], TLC 3.71 [70%] Mild restriction Unable to complete spirometry and diffusion capacity.  Labs: CBC 06/19/2022 WBC 8.9, eosinophils 1.1%, absolute eosinophil count 98 IgE 06/22/2022-250  Cardiac: Echocardiogram 10/06/2021-LVEF 60 to  65%.  Normal RV systolic function and size  Assessment & Plan Moderate persistent asthma Moderate persistent asthma with recent exacerbation likely due to running out of Symbicort  inhaler. Symptoms include dyspnea, especially during ambulation. No recent hospital admissions for asthma exacerbation. She reports using Advair instead of Symbicort , which may have contributed to the exacerbation.  - Reorder Symbicort  inhaler from [facility]. - Instruct her to use Symbicort  inhaler daily, two puffs in the morning and two puffs in the evening. - Advise her to contact the clinic if the inhaler runs out to prevent exacerbations.  Lung nodules Lung nodules measuring 5 millimeters or less, previously 6 millimeters. Nodules appear benign with no significant changes on recent CT scan. She is at high risk for lung-related issues due to extensive smoking history. - Referral for annual screening CT scan to monitor lung nodules due to high risk from smoking history.  Smoking Extensive smoking history of two packs per day for nearly fifty years, quit in 2021. High risk for lung-related issues.   Assessment for abnormal CT, concern for interstitial lung disease I reviewed CT which showed mild groundglass with mosaic attenuation.  Findings are very nonspecific and may represent pulmonary vascular disease, obstructive airway disease or hypersensitivity, COP, smoking-related changes. She does not  have significant exposures, no pulmonary hypertension on echocardiogram last year.  There is a read of DIPNICH syndrome but that is a diagnosis of exclusion.  There is no evidence of interstitial lung disease on follow-up scans  Plan/Recommendations: Resume Symbicort  Referral for screening CT chest  Phyllis Breeze MD [facility] 03/28/2024, 9:53 AM  CC: Zarwolo, [name], FNP  Thank you

## 2024-03-29 ENCOUNTER — Other Ambulatory Visit: Payer: Self-pay | Admitting: Family Medicine

## 2024-03-29 DIAGNOSIS — I10 Essential (primary) hypertension: Secondary | ICD-10-CM

## 2024-04-16 ENCOUNTER — Encounter: Admitting: Physical Medicine and Rehabilitation

## 2024-04-27 ENCOUNTER — Telehealth: Payer: Self-pay

## 2024-04-27 NOTE — Telephone Encounter (Signed)
 Spoke with [facility]. Advised her of previous note and explained that she would be contacted at a sooner date for CT. Pt verbalized understanding & had no concerns. Nothing further needed.  Thanks for the explanation!

## 2024-04-27 NOTE — Telephone Encounter (Signed)
 His last Lung CT was 02/21/2024 with annual CT f/u recommended.

## 2024-04-27 NOTE — Telephone Encounter (Signed)
 Copied from CRM [id number]. Topic: Clinical - Request for Lab/Test Order >> Apr 27, 2024 10:15 AM Tyronne Galloway wrote: Reason for CRM: Pt was told at visit with Dr. Waylan Haggard on 4/43 to have a yearly CT scan completed, however I did not see a new CT order in the pt's chart. Please create order for CT so patient can be scheduled.  Can you please advise pt with why LCS CT scan was deferred until 02/2025.

## 2024-05-23 ENCOUNTER — Ambulatory Visit (INDEPENDENT_AMBULATORY_CARE_PROVIDER_SITE_OTHER)

## 2024-05-23 VITALS — Ht 64.0 in | Wt 196.0 lb

## 2024-05-23 DIAGNOSIS — Z0000 Encounter for general adult medical examination without abnormal findings: Secondary | ICD-10-CM | POA: Diagnosis not present

## 2024-05-23 NOTE — Patient Instructions (Signed)
 Monica [name] , Thank you for taking time out of your busy schedule to complete your Annual Wellness Visit with me. I enjoyed our conversation and look forward to speaking with you again next year. I, as well as your care team,  appreciate your ongoing commitment to your health goals. Please review the following plan we discussed and let me know if I can assist you in the future.  Your Game plan/ To Do List     Referrals Placed: no referrals placed this visit   Follow up Visits: Next Medicare AWV with clinical staff:May 27, 2025 at 12:30 pm video visit Next Office Visit with your Primary Care Doctor: June 11, 2024 at 9:00 am in office Clinician Recommendations:   Aim for 30 minutes of exercise or brisk walking, 6-8 glasses of water, and 5 servings of fruits and vegetables each day.  I enjoyed our conversation today and look forward to talking with you again next year!! Have a wonderful and safe year.  All the best, Antinette Keough      This is a list of the screening recommended for you and due dates:  Health Maintenance  Topic Date Due   Pneumococcal Vaccination (1 of 2 - PCV) Never done   Zoster (Shingles) Vaccine (1 of 2) Never done   COVID-19 Vaccine (3 - Moderna risk series) 04/12/2020   DTaP/Tdap/Td vaccine (1 - Tdap) 09/14/2024*   DEXA scan (bone density measurement)  06/02/2024   Flu Shot  07/06/2024   Mammogram  10/02/2024   Medicare Annual Wellness Visit  05/23/2025   Colon Cancer Screening  10/20/2026   Pap with HPV screening  09/14/2028   Hepatitis C Screening  Completed   HIV Screening  Completed   HPV Vaccine  Aged Out   Meningitis B Vaccine  Aged Out  *Topic was postponed. The date shown is not the original due date.    Advanced directives: (Provided) Advance directive discussed with you today. I have provided a copy for you to complete at home and have notarized. Once this is complete, please bring a copy in to our office so we can scan it into your chart.  Advance Care  Planning is important because it:  [x]  Makes sure you receive the medical care that is consistent with your values, goals, and preferences  [x]  It provides guidance to your family and loved ones and reduces their decisional burden about whether or not they are making the right decisions based on your wishes.  Follow the link provided in your after visit summary or read over the paperwork we have mailed to you to help you started getting your Advance Directives in place. If you need assistance in completing these, please reach out to us  so that we can help you!  See attachments for Preventive Care and Fall Prevention Tips.   Managing Pain Without Opioids  Opioids are strong medicines used to treat moderate to severe pain. For some people, especially those who have long-term (chronic) pain, opioids may not be the best choice for pain management due to: Side effects like nausea, constipation, and sleepiness. The risk of addiction (opioid use disorder). The longer you take opioids, the greater your risk of addiction. Pain that lasts for more than 3 months is called chronic pain. Managing chronic pain usually requires more than one approach and is often provided by a team of health care providers working together (multidisciplinary approach). Pain management may be done at a pain management center or pain clinic. How to  manage pain without the use of opioids Use non-opioid medicines Non-opioid medicines for pain may include: Over-the-counter or prescription non-steroidal anti-inflammatory drugs (NSAIDs). These may be the first medicines used for pain. They work well for muscle and bone pain, and they reduce swelling. Acetaminophen . This over-the-counter medicine may work well for milder pain but not swelling. Antidepressants. These may be used to treat chronic pain. A certain type of antidepressant (tricyclics) is often used. These medicines are given in lower doses for pain than when used for  depression. Anticonvulsants. These are usually used to treat seizures but may also reduce nerve (neuropathic) pain. Muscle relaxants. These relieve pain caused by sudden muscle tightening (spasms). You may also use a pain medicine that is applied to the skin as a patch, cream, or gel (topical analgesic), such as a numbing medicine. These may cause fewer side effects than medicines taken by mouth. Do certain therapies as directed Some therapies can help with pain management. They include: Physical therapy. You will do exercises to gain strength and flexibility. A physical therapist may teach you exercises to move and stretch parts of your body that are weak, stiff, or painful. You can learn these exercises at physical therapy visits and practice them at home. Physical therapy may also involve: Massage. Heat wraps or applying heat or cold to affected areas. Electrical signals that interrupt pain signals (transcutaneous electrical nerve stimulation, TENS). Weak lasers that reduce pain and swelling (low-level laser therapy). Signals from your body that help you learn to regulate pain (biofeedback). Occupational therapy. This helps you to learn ways to function at home and work with less pain. Recreational therapy. This involves trying new activities or hobbies, such as a physical activity or drawing. Mental health therapy, including: Cognitive behavioral therapy (CBT). This helps you learn coping skills for dealing with pain. Acceptance and commitment therapy (ACT) to change the way you think and react to pain. Relaxation therapies, including muscle relaxation exercises and mindfulness-based stress reduction. Pain management counseling. This may be individual, family, or group counseling.  Receive medical treatments Medical treatments for pain management include: Nerve block injections. These may include a pain blocker and anti-inflammatory medicines. You may have injections: Near the spine to  relieve chronic back or neck pain. Into joints to relieve back or joint pain. Into nerve areas that supply a painful area to relieve body pain. Into muscles (trigger point injections) to relieve some painful muscle conditions. A medical device placed near your spine to help block pain signals and relieve nerve pain or chronic back pain (spinal cord stimulation device). Acupuncture. Follow these instructions at home Medicines Take over-the-counter and prescription medicines only as told by your health care provider. If you are taking pain medicine, ask your health care providers about possible side effects to watch out for. Do not drive or use heavy machinery while taking prescription opioid pain medicine. Lifestyle  Do not use drugs or alcohol to reduce pain. If you drink alcohol, limit how much you have to: 0-1 drink a day for women who are not pregnant. 0-2 drinks a day for men. Know how much alcohol is in a drink. In the [place] one drink equals one 12 oz bottle of beer (355 mL), one 5 oz glass of wine (148 mL), or one 1 oz glass of hard liquor (44 mL). Do not use any products that contain nicotine or tobacco. These products include cigarettes, chewing tobacco, and vaping devices, such as e-cigarettes. If you need help quitting, ask your health  care provider. Eat a healthy diet and maintain a healthy weight. Poor diet and excess weight may make pain worse. Eat foods that are high in fiber. These include fresh fruits and vegetables, whole grains, and beans. Limit foods that are high in fat and processed sugars, such as fried and sweet foods. Exercise regularly. Exercise lowers stress and may help relieve pain. Ask your health care provider what activities and exercises are safe for you. If your health care provider approves, join an exercise class that combines movement and stress reduction. Examples include yoga and tai chi. Get enough sleep. Lack of sleep may make pain worse. Lower stress  as much as possible. Practice stress reduction techniques as told by your therapist. General instructions Work with all your pain management providers to find the treatments that work best for you. You are an important member of your pain management team. There are many things you can do to reduce pain on your own. Consider joining an online or in-person support group for people who have chronic pain. Keep all follow-up visits. This is important. Where to find more information You can find more information about managing pain without opioids from: American Academy of Pain Medicine: painmed.org Institute for Chronic Pain: instituteforchronicpain.org American Chronic Pain Association: theacpa.org Contact a health care provider if: You have side effects from pain medicine. Your pain gets worse or does not get better with treatments or home therapy. You are struggling with anxiety or depression. Summary Many types of pain can be managed without opioids. Chronic pain may respond better to pain management without opioids. Pain is best managed when you and a team of health care providers work together. Pain management without opioids may include non-opioid medicines, medical treatments, physical therapy, mental health therapy, and lifestyle changes. Tell your health care providers if your pain gets worse or is not being managed well enough. This information is not intended to replace advice given to you by your health care provider. Make sure you discuss any questions you have with your health care provider. Document Revised: 03/04/2021 Document Reviewed: 03/04/2021 Elsevier Patient Education  2023 [employer]. Understanding Your Risk for Falls Millions of people have serious injuries from falls each year. It is important to understand your risk of falling. Talk with your health care provider about your risk and what you can do to lower it. If you do have a serious fall, make sure to tell your  provider. Falling once raises your risk of falling again. How can falls affect me? Serious injuries from falls are common. These include: Broken bones, such as hip fractures. Head injuries, such as traumatic brain injuries (TBI) or concussions. A fear of falling can cause you to avoid activities and stay at home. This can make your muscles weaker and raise your risk for a fall. What can increase my risk? There are a number of risk factors that increase your risk for falling. The more risk factors you have, the higher your risk of falling. Serious injuries from a fall happen most often to people who are older than 54 years old. Teenagers and young adults ages 77-29 are also at higher risk. Common risk factors include: Weakness in the lower body. Being generally weak or confused due to long-term (chronic) illness. Dizziness or balance problems. Poor vision. Medicines that cause dizziness or drowsiness. These may include: Medicines for your blood pressure, heart, anxiety, insomnia, or swelling (edema). Pain medicines. Muscle relaxants. Other risk factors include: Drinking alcohol. Having had a fall  in the past. Having foot pain or wearing improper footwear. Working at a dangerous job. Having any of the following in your home: Tripping hazards, such as floor clutter or loose rugs. Poor lighting. Pets. Having dementia or memory loss. What actions can I take to lower my risk of falling?     Physical activity Stay physically fit. Do strength and balance exercises. Consider taking a regular class to build strength and balance. Yoga and tai chi are good options. Vision Have your eyes checked every year and your prescription for glasses or contacts updated as needed. Shoes and walking aids Wear non-skid shoes. Wear shoes that have rubber soles and low heels. Do not wear high heels. Do not walk around the house in socks or slippers. Use a cane or walker as told by your provider. Home  safety Attach secure railings on both sides of your stairs. Install grab bars for your bathtub, shower, and toilet. Use a non-skid mat in your bathtub or shower. Attach bath mats securely with double-sided, non-slip rug tape. Use good lighting in all rooms. Keep a flashlight near your bed. Make sure there is a clear path from your bed to the bathroom. Use night-lights. Do not use throw rugs. Make sure all carpeting is taped or tacked down securely. Remove all clutter from walkways and stairways, including extension cords. Repair uneven or broken steps and floors. Avoid walking on icy or slippery surfaces. Walk on the grass instead of on icy or slick sidewalks. Use ice melter to get rid of ice on walkways in the winter. Use a cordless phone. Questions to ask your health care provider Can you help me check my risk for a fall? Do any of my medicines make me more likely to fall? Should I take a vitamin D  supplement? What exercises can I do to improve my strength and balance? Should I make an appointment to have my vision checked? Do I need a bone density test to check for weak bones (osteoporosis)? Would it help to use a cane or a walker? Where to find more information Centers for Disease Control and Prevention, [employer]: TonerPromos.no Community-Based Fall Prevention Programs: TonerPromos.no [employer] on Aging: BaseRingTones.pl Contact a health care provider if: You fall at home. You are afraid of falling at home. You feel weak, drowsy, or dizzy. This information is not intended to replace advice given to you by your health care provider. Make sure you discuss any questions you have with your health care provider. Document Revised: 07/26/2022 Document Reviewed: 07/26/2022 Elsevier Patient Education  2024 [employer].

## 2024-05-23 NOTE — Progress Notes (Signed)
 Please attest and cosign this visit due to patients primary care provider not being in the office at the time the visit was completed.   Subjective:   Monica [name] is a 53 y.o. who presents for a Medicare Wellness preventive visit.  As a reminder, Annual Wellness Visits don't include a physical exam, and some assessments may be limited, especially if this visit is performed virtually. We may recommend an in-person follow-up visit with your provider if needed.  Visit Complete: Virtual I connected with  Monica [name] on 05/23/24 by a audio enabled telemedicine application and verified that I am speaking with the correct person using two identifiers.  Patient Location: Home  Provider Location: Home Office  I discussed the limitations of evaluation and management by telemedicine. The patient expressed understanding and agreed to proceed.  Vital Signs: Because this visit was a virtual/telehealth visit, some criteria may be missing or patient reported. Any vitals not documented were not able to be obtained and vitals that have been documented are patient reported.  VideoError- Librarian, academic were attempted between this provider and patient, however failed, due to patient having technical difficulties OR patient did not have access to video capability.  We continued and completed visit with audio only.   Persons Participating in Visit: Patient.  AWV Questionnaire: No: Patient Medicare AWV questionnaire was not completed prior to this visit.  Cardiac Risk Factors include: advanced age (>71men, >87 women);hypertension;obesity (BMI >30kg/m2);sedentary lifestyle     Objective:    Today's Vitals   05/23/24 0902 05/23/24 0904  Weight: 196 lb (88.9 kg)   Height: 5' 4 (1.626 m)   PainSc:  7    Body mass index is 33.64 kg/m.     05/23/2024    9:21 AM 10/21/2023   10:19 AM 10/18/2023    3:40 PM 06/13/2023    6:00 PM 12/22/2022   11:12 AM  11/24/2022    8:23 AM 11/23/2022    2:48 PM  Advanced Directives  Does Patient Have a Medical Advance Directive? No No No No No No No  Would patient like information on creating a medical advance directive? Yes (MAU/Ambulatory/Procedural Areas - Information given) No - Patient declined No - Patient declined No - Patient declined  No - Patient declined No - Patient declined    Current Medications (verified) Outpatient Encounter Medications as of 05/23/2024  Medication Sig   albuterol  (PROVENTIL ) (2.5 MG/3ML) 0.083% nebulizer solution Take 3 mLs (2.5 mg total) by nebulization every 6 (six) hours as needed for wheezing or shortness of breath.   albuterol  (VENTOLIN  HFA) 108 (90 Base) MCG/ACT inhaler Inhale 2 puffs into the lungs every 4 (four) hours as needed for wheezing or shortness of breath.   budesonide -formoterol  (SYMBICORT ) 160-4.5 MCG/ACT inhaler Inhale 2 puffs into the lungs in the morning and at bedtime.   calcium citrate (CALCITRATE - DOSED IN MG ELEMENTAL CALCIUM) 950 (200 Ca) MG tablet Take 200 mg of elemental calcium by mouth daily.   clonazePAM (KLONOPIN) 0.5 MG tablet Take 0.5 mg by mouth daily as needed.   diazepam  (VALIUM ) 5 MG tablet Take one tablet by mouth with food one hour prior to procedure. May repeat 30 minutes prior if needed.   escitalopram (LEXAPRO) 20 MG tablet Take by mouth.   HYDROcodone -acetaminophen  (NORCO) 5-325 MG tablet Take 1 tablet by mouth 3 (three) times daily as needed for moderate pain (pain score 4-6).   letrozole  (FEMARA ) 2.5 MG tablet Take 1 tablet (2.5 mg  total) by mouth daily.   lisinopril  (ZESTRIL ) 10 MG tablet take 1 tablet (10 MILLIGRAM total) by mouth daily.   prazosin (MINIPRESS) 1 MG capsule take 1 capsule by mouth daily; for blood pressure   temazepam (RESTORIL) 15 MG capsule Take 15 mg by mouth at bedtime as needed for sleep.   No facility-administered encounter medications on file as of 05/23/2024.    Allergies (verified) Oxycodone     History: Past Medical History:  Diagnosis Date   Arthritis    Asthma    Back pain    Breast cancer (HCC)    Buttock pain    Dyspnea    on exertion   Family history of breast cancer 09/22/2020   Hypertension    Multiple lung nodules on CT 2022   Personal history of chemotherapy    Personal history of radiation therapy    Past Surgical History:  Procedure Laterality Date   ABDOMINAL HYSTERECTOMY     APPENDECTOMY     BREAST LUMPECTOMY WITH RADIOACTIVE SEED AND SENTINEL LYMPH NODE BIOPSY Right 09/10/2020   Procedure: RIGHT BREAST LUMPECTOMY WITH RADIOACTIVE SEED AND SENTINEL LYMPH NODE MAPPING;  Surgeon: Sim Dryer, MD;  Location: [facility];  Service: General;  Laterality: Right;   COLONOSCOPY WITH PROPOFOL  N/A 10/21/2023   Procedure: COLONOSCOPY WITH PROPOFOL ;  Surgeon: Vinetta Greening, DO;  [facility];  Service: Endoscopy;  Laterality: N/A;  11:00am, asa 3   POLYPECTOMY  10/21/2023   Procedure: POLYPECTOMY INTESTINAL;  Surgeon: Vinetta Greening, DO;  Location: [facility];  Service: Endoscopy;;   PORTACATH PLACEMENT Right 10/16/2020   Procedure: INSERTION PORT-A-CATH WITH ULTRASOUND GUIDANCE;  Surgeon: Sim Dryer, MD;  Location: [facility];  Service: General;  Laterality: Right;   RE-EXCISION OF BREAST LUMPECTOMY Right 10/16/2020   Procedure: RE-EXCISION OF RIGHT BREAST LUMPECTOMY;  Surgeon: Sim Dryer, MD;  Location: [facility];  Service: General;  Laterality: Right;   REVERSE SHOULDER ARTHROPLASTY Left 11/16/2022   Procedure: LEFT REVERSE SHOULDER ARTHROPLASTY;  Surgeon: Wilhelmenia Harada, MD;  Location: [facility];  Service: Orthopedics;  Laterality: Left;   Family History  Problem Relation Age of Onset   Hypertension Father    Hypertension Sister    Hypertension Maternal Grandmother    Breast cancer Maternal Aunt 74   Breast cancer Paternal Aunt        dx at unknown age   Breast  cancer Other        MGM's niece; dx 67s   Breast cancer Other        MGF's niece   Social History   Socioeconomic History   Marital status: Single    Spouse name: Not on file   Number of children: Not on file   Years of education: Not on file   Highest education level: Not on file  Occupational History   Not on file  Tobacco Use   Smoking status: Former    Current packs/day: 0.00    Average packs/day: 0.5 packs/day for 35.0 years (17.5 ttl pk-yrs)    Types: Cigarettes    Start date: 08/22/1985    Quit date: 08/22/2020    Years since quitting: 3.7   Smokeless tobacco: Never  Vaping Use   Vaping status: Never Used  Substance and Sexual Activity   Alcohol use: No   Drug use: Never   Sexual activity: Not Currently    Birth control/protection: Surgical  Other Topics Concern   Not on  file  Social History Narrative   Not on file   Social Drivers of Health   Financial Resource Strain: Low Risk  (05/23/2024)   Overall Financial Resource Strain (CARDIA)    Difficulty of Paying Living Expenses: Not hard at all  Food Insecurity: No Food Insecurity (05/23/2024)   Hunger Vital Sign    Worried About Running Out of Food in the Last Year: Never true    Ran Out of Food in the Last Year: Never true  Transportation Needs: No Transportation Needs (05/23/2024)   PRAPARE - Administrator, Civil Service (Medical): No    Lack of Transportation (Non-Medical): No  Physical Activity: Inactive (05/23/2024)   Exercise Vital Sign    Days of Exercise per Week: 0 days    Minutes of Exercise per Session: 0 min  Stress: Stress Concern Present (05/23/2024)   [employer] of Occupational Health - Occupational Stress Questionnaire    Feeling of Stress: Rather much  Social Connections: Moderately Isolated (05/23/2024)   Social Connection and Isolation Panel    Frequency of Communication with Friends and Family: More than three times a week    Frequency of Social Gatherings with Friends  and Family: Twice a week    Attends Religious Services: 1 to 4 times per year    Active Member of [employer] or Organizations: No    Attends Engineer, structural: Never    Marital Status: Never married    Tobacco Counseling Counseling given: Yes    Clinical Intake:  Pre-visit preparation completed: Yes  Pain : 0-10 Pain Score: 7  Pain Type: Chronic pain Pain Location: Hip Pain Orientation: Left Pain Radiating Towards: bilateral lower legs and feet Pain Descriptors / Indicators: Constant, Aching, Numbness Pain Onset: More than a month ago Pain Frequency: Constant     BMI - recorded: 33.64 Nutritional Status: BMI > 30  Obese Nutritional Risks: None Diabetes: No  Lab Results  Component Value Date   HGBA1C 5.5 08/31/2023     How often do you need to have someone help you when you read instructions, pamphlets, or other written materials from your doctor or pharmacy?: 1 - Never  Interpreter Needed?: No  Information entered by :: Sally Crazier CMA   Activities of Daily Living     05/23/2024    9:22 AM 10/18/2023    3:41 PM  In your present state of health, do you have any difficulty performing the following activities:  Hearing? 0   Vision? 0   Difficulty concentrating or making decisions? 0   Walking or climbing stairs? 0   Dressing or bathing? 1   Doing errands, shopping? 1 0  Comment Uses [employer] or her aid takes her   Quarry manager and eating ? Y   Using the Toilet? N   In the past six months, have you accidently leaked urine? N   Do you have problems with loss of bowel control? N   Managing your Medications? N   Managing your Finances? N   Housekeeping or managing your Housekeeping? Y    Patient has a home health aid that comes out and assist her with whichever activities she may need help with such as cooking, cleaning, grocery shopping, and ordering items online  Patient Care Team: Zarwolo, [name], FNP as PCP - General (Family  Medicine) Cameron Cea, MD as Consulting Physician (Hematology and Oncology) Colie Dawes, MD as Attending Physician (Radiation Oncology) [facility]  as Aeronautical engineer) Mordechai April, Rolando Cliche, DO as Consulting Physician (Gastroenterology) Wilhelmenia Harada, MD as Consulting Physician (Orthopedic Surgery) Mannam, Praveen, MD as Consulting Physician (Pulmonary Disease) Bea Lime, DO as Consulting Physician (Physical Medicine and Rehabilitation) Alexia Idler, OD (Optometry) [facility] as Consulting Physician (Optometry) Bridget Campion, MD as Consulting Physician (Physical Medicine and Rehabilitation) Cameron Cea, MD as Consulting Physician (Hematology and Oncology)  I have updated your Care Teams any recent Medical Services you may have received from other providers in the past year.     Assessment:   This is a routine wellness examination for Monica [name].  Hearing/Vision screen Hearing Screening - Comments:: Patient denies any hearing difficulties.   Vision Screening - Comments:: Patient wears reading glasses only. Up to date with yearly exams.  Patient sees [facility]   Goals Addressed             This Visit's Progress    Patient Stated       I'd like to work on losing some weight        Depression Screen  Patient has had several deaths in her family within last 2-3 months. Her grandmother is currently in organ failure. She has a strong support system and sees BH every 2 weeks     05/23/2024    9:08 AM 02/08/2024    9:34 AM 09/30/2023   10:14 AM 09/30/2023   10:05 AM 09/15/2023   10:33 AM 09/05/2023    3:27 PM 07/25/2023    9:26 AM  PHQ 2/9 Scores  PHQ - 2 Score 4 2 0 0 2 4 2   PHQ- 9 Score 9  0 0 11      Fall Risk     05/23/2024    9:21 AM 02/08/2024    9:34 AM 01/11/2024    9:27 AM 09/30/2023   10:15 AM 09/30/2023   10:14 AM  Fall Risk    Falls in the past year? 0 0 0 0 0  Number falls in past yr: 0 0   0  Injury with Fall? 0 0   0  Risk for fall due to : No Fall Risks    No Fall Risks  Follow up Falls evaluation completed    Falls evaluation completed    MEDICARE RISK AT HOME:  Medicare Risk at Home Any stairs in or around the home?: No If so, are there any without handrails?: No Home free of loose throw rugs in walkways, pet beds, electrical cords, etc?: Yes Adequate lighting in your home to reduce risk of falls?: Yes Life alert?: No Use of a cane, walker or w/c?: No Grab bars in the bathroom?: Yes Shower chair or bench in shower?: Yes Elevated toilet seat or a handicapped toilet?: Yes  TIMED UP AND GO:  Was the test performed?  No  Cognitive Function: 6CIT completed        05/23/2024    9:23 AM  6CIT Screen  What Year? 0 points  What month? 0 points  What time? 0 points  Count back from 20 0 points  Months in reverse 0 points  Repeat phrase 0 points  Total Score 0 points    Immunizations Immunization History  Administered Date(s) Administered   Influenza, Seasonal, Injecte, Preservative Fre 08/31/2023   Influenza,inj,quad, With Preservative 09/21/2019   Influenza-Unspecified 09/09/2020, 10/06/2022   Moderna Sars-Covid-2 Vaccination 02/14/2020, 03/15/2020    Screening Tests Health Maintenance  Topic Date Due  Pneumococcal Vaccine 20-38 Years old (1 of 2 - PCV) Never done   Zoster Vaccines- Shingrix (1 of 2) Never done   COVID-19 Vaccine (3 - Moderna risk series) 04/12/2020   DTaP/Tdap/Td (1 - Tdap) 09/14/2024 (Originally 09/08/1990)   DEXA SCAN  06/02/2024   INFLUENZA VACCINE  07/06/2024   MAMMOGRAM  10/02/2024   Medicare Annual Wellness (AWV)  05/23/2025   Colonoscopy  10/20/2026   Cervical Cancer Screening (HPV/Pap Cotest)  09/14/2028   Hepatitis C Screening  Completed   HIV Screening  Completed   HPV VACCINES  Aged Out   Meningococcal B Vaccine  Aged Out    Health  Maintenance  Health Maintenance Due  Topic Date Due   Pneumococcal Vaccine 54-74 Years old (1 of 2 - PCV) Never done   Zoster Vaccines- Shingrix (1 of 2) Never done   COVID-19 Vaccine (3 - Moderna risk series) 04/12/2020   Health Maintenance Items Addressed: Patient is up to date on HM items. Recommended vaccines discussed.  Dexa scan added to patients Health Maintenance items due to current use of cancer treatment drugs. Last Dexa in 2023. Scheduled later this year for repeat scan.   Additional Screening:  Vision Screening: Recommended annual ophthalmology exams for early detection of glaucoma and other disorders of the eye. Would you like a referral to an eye doctor? No    Dental Screening: Recommended annual dental exams for proper oral hygiene  Community Resource Referral / Chronic Care Management: CRR required this visit?  No   CCM required this visit?  No   Plan:    I have personally reviewed and noted the following in the patient's chart:   Medical and social history Use of alcohol, tobacco or illicit drugs  Current medications and supplements including opioid prescriptions. Patient is currently taking opioid prescriptions. Information provided to patient regarding non-opioid alternatives. Patient advised to discuss non-opioid treatment plan with their provider. Functional ability and status Nutritional status Physical activity Advanced directives List of other physicians Hospitalizations, surgeries, and ER visits in previous 12 months Vitals Screenings to include cognitive, depression, and falls Referrals and appointments  In addition, I have reviewed and discussed with patient certain preventive protocols, quality metrics, and best practice recommendations. A written personalized care plan for preventive services as well as general preventive health recommendations were provided to patient.   Sherre Wooton, CMA   05/23/2024   After Visit Summary: (MyChart) Due  to this being a telephonic visit, the after visit summary with patients personalized plan was offered to patient via MyChart   Notes: Nothing significant to report at this time.

## 2024-05-30 ENCOUNTER — Encounter: Attending: Physical Medicine and Rehabilitation | Admitting: Physical Medicine and Rehabilitation

## 2024-05-30 VITALS — BP 109/77 | HR 77 | Ht 64.0 in | Wt 201.0 lb

## 2024-05-30 DIAGNOSIS — M7062 Trochanteric bursitis, left hip: Secondary | ICD-10-CM | POA: Insufficient documentation

## 2024-05-30 MED ORDER — TRIAMCINOLONE ACETONIDE 40 MG/ML IJ SUSP
40.0000 mg | Freq: Once | INTRAMUSCULAR | Status: AC
  Administered 2024-05-30: 40 mg

## 2024-05-30 MED ORDER — LIDOCAINE HCL 1 % IJ SOLN
2.0000 mL | Freq: Once | INTRAMUSCULAR | Status: AC
Start: 2024-05-30 — End: 2024-05-30
  Administered 2024-05-30: 2 mL

## 2024-05-30 NOTE — Progress Notes (Signed)
 HPI: Monica [name] is a 53 y.o. female with PMHx has Primary malignant neoplasm of upper outer quadrant of female breast, right (HCC); Family history of breast cancer; Genetic testing; Port-A-Cath in place; Trochanteric bursitis of left hip; Muscle cramp, nocturnal; Spondylolisthesis at L5-S1 level; Lumbar radiculopathy; Primary osteoarthritis, left shoulder; Depression, recurrent (HCC); Cervical radiculopathy; Difficulty sleeping; Moderate persistent asthma; Hypertension; Cervical cancer screening; Obesity (BMI 30-39.9); Myofascial pain on left side; Chronic pain syndrome; Impaired mobility and ADLs; and Viral illness on their problem list. who presents to clinic for treatment of pain related to L hip pain  via injection as described below.    No new concerns or complaints. No major changes in medical history since last visit.   Physical Exam:  General: Appropriate appearance for age.  Mental Status: Appropriate mood and affect.  Cardiovascular: RRR, no m/r/g.  Respiratory: CTAB, no rales/rhonchi/wheezing.  Skin: No apparent rashes or lesions.  Neuro: Awake, alert, and oriented x3. No apparent deficits.  MSK:  Moving all 4 limbs antigravity and against resistance.  + TTP L lateral GTB and IT band; no TTP PSIS, lumbar paraspinals, or L quads/hamstrings + Pain with resisted hip abduction  PROCEDURE:  Left  greater trochanteric bursa Diagnosis:    ICD-10-CM   1. Trochanteric bursitis of left hip  M70.62       Goals with treatment: [ x ] Decrease pain [  ] Improve Active / Passive ROM [ x ] Improve ADLs [ x ] Improve functional mobility  MEDICATION:  [ x ] Kenalog  40 mg/mL  [ X ] Lidocaine  1%    CONSENT: Obtained in writing per policy. Consent uploaded to chart.  Benefits discussed.  Risks discussed included, but were not limited to, pain and discomfort, bleeding, bruising, allergic reaction, infection. All questions answered to patient/family member/guardian/ caregiver  satisfaction. They would like to proceed with procedure. There are no noted contraindications to procedure.  PROCEDURE Time out was preformed No heat sources No antibiotics  The patient was explained about both the benefits and risks of a Left  Greater trochanteric bursa injection. After the patient acknowledged an understanding of the risks and benefits, the patient agreed to proceed. The area was first marked and then prepped in an aseptic fashion with betadine  / alcohol. A 25 g, 1.5 inch needle was directed via a direct approach into the medial border of the left greater trochanteric bursa  . The injection was completed with Kenalog  40 mg/ml 1 cc mixed with 2 cc of 1% lidocaine  after no blood was aspirated on pull back.  No complications were encountered. The patient tolerated the procedure well.  Impression: HPI: Monica [name] is a 53 y.o. female with PMHx has Primary malignant neoplasm of upper outer quadrant of female breast, right (HCC); Family history of breast cancer; Genetic testing; Port-A-Cath in place; Trochanteric bursitis of left hip; Muscle cramp, nocturnal; Spondylolisthesis at L5-S1 level; Lumbar radiculopathy; Primary osteoarthritis, left shoulder; Depression, recurrent (HCC); Cervical radiculopathy; Difficulty sleeping; Moderate persistent asthma; Hypertension; Cervical cancer screening; Obesity (BMI 30-39.9); Myofascial pain on left side; Chronic pain syndrome; Impaired mobility and ADLs; and Viral illness on their problem list. who presents to clinic for treatment of L hip pain . They received a  Left  GTB as above.   PLAN: - Resume Usual Activities. Notify Physician of any unusual bleeding, erythema or concern for side effects as reviewed above. - Apply ice prn for pain - Tylenol  prn for pain - Follow up as scheduled  Patient/Care Monica [name] was ready to learn without apparent learning barriers. Education was provided on diagnosis, treatment options/plan according to  patient's preferred learning style. Patient/Care Giver verbalized understanding and agreement with the above plan.   Monica JAYSON Likes, DO 05/30/2024

## 2024-05-30 NOTE — Patient Instructions (Signed)
-   Resume Usual Activities. Notify Physician of any unusual bleeding, erythema or concern for side effects as reviewed above. - Apply ice prn for pain - Tylenol prn for pain - Follow up as scheduled

## 2024-06-11 ENCOUNTER — Encounter: Payer: Self-pay | Admitting: Family Medicine

## 2024-06-11 ENCOUNTER — Ambulatory Visit (INDEPENDENT_AMBULATORY_CARE_PROVIDER_SITE_OTHER): Admitting: Family Medicine

## 2024-06-11 VITALS — BP 130/89 | HR 81 | Resp 16 | Ht 64.0 in | Wt 199.1 lb

## 2024-06-11 DIAGNOSIS — I10 Essential (primary) hypertension: Secondary | ICD-10-CM

## 2024-06-11 DIAGNOSIS — R7301 Impaired fasting glucose: Secondary | ICD-10-CM | POA: Diagnosis not present

## 2024-06-11 DIAGNOSIS — E038 Other specified hypothyroidism: Secondary | ICD-10-CM | POA: Diagnosis not present

## 2024-06-11 DIAGNOSIS — E559 Vitamin D deficiency, unspecified: Secondary | ICD-10-CM | POA: Diagnosis not present

## 2024-06-11 DIAGNOSIS — E7849 Other hyperlipidemia: Secondary | ICD-10-CM

## 2024-06-11 MED ORDER — LISINOPRIL 10 MG PO TABS: 10.0000 mg | ORAL_TABLET | Freq: Every day | ORAL | 1 refills | Status: DC

## 2024-06-11 NOTE — Patient Instructions (Addendum)
 I appreciate the opportunity to provide care to you today!    Follow up:  4 months  Labs: please stop by the lab today to get your blood drawn (CBC, CMP, TSH, Lipid profile, HgA1c, Vit D)  For a Healthier YOU, I Recommend: Reducing your intake of sugar, sodium, carbohydrates, and saturated fats. Increasing your fiber intake by incorporating more whole grains, fruits, and vegetables into your meals. Setting healthy goals with a focus on lowering your consumption of carbs, sugar, and unhealthy fats. Adding variety to your diet by including a wide range of fruits and vegetables. Cutting back on soda and limiting processed foods as much as possible. Staying active: In addition to taking your weight loss medication, aim for at least 150 minutes of moderate-intensity physical activity each week for optimal results.    Please follow up if your symptoms worsen or fail to improve.   Please stop by your local pharmacy and get your Tdap and Shingles vaccine  Referrals today-   Attached with your AVS, you will find valuable resources for self-education. I highly recommend dedicating some time to thoroughly examine them.   Please continue to a heart-healthy diet and increase your physical activities. Try to exercise for at least five days a week.    It was a pleasure to see you and I look forward to continuing to work together on your health and well-being. Please do not hesitate to call the office if you need care or have questions about your care.  In case of emergency, please visit the Emergency Department for urgent care, or contact our clinic at [phone] to schedule an appointment. We're here to help you!   Have a wonderful day and week. With Gratitude, Jaya Lapka MSN, FNP-BC

## 2024-06-11 NOTE — Assessment & Plan Note (Signed)
 Controlled Encouraged to continue taking lisinopril  10 mg daily as prescribed A low-sodium diet of less than 2,300 mg daily is recommended, along with moderate-intensity physical activity for at least 150 minutes per week. The patient is encouraged to maintain these lifestyle modifications to help manage her blood pressure effectively.  Long-term considerations were discussed, emphasizing that uncontrolled hypertension increases the risk of cardiovascular diseases, including stroke, coronary artery disease, and heart failure.  The patient is encouraged to seek emergency care if blood pressure exceeds 180/120 and is accompanied by symptoms such as headaches, chest pain, palpitations, blurred vision, or dizziness. She verbalized understanding and will follow up as scheduled.  BP Readings from Last 3 Encounters:  06/11/24 130/89  05/30/24 109/77  03/28/24 110/72

## 2024-06-11 NOTE — Progress Notes (Signed)
 Established Patient Office Visit  Subjective:  Patient ID: Monica [name], female    DOB: Oct 17, 1971  Age: 53 y.o. MRN: [id number]  CC:  Chief Complaint  Patient presents with   Hypertension    4 month follow up     HPI Monica [name] is a 53 y.o. female with past medical history of  Hypertension presents for f/u of  chronic medical conditions.  For the details of today's visit, please refer to the assessment and plan.     Past Medical History:  Diagnosis Date   Arthritis    Asthma    Back pain    Breast cancer (HCC)    Buttock pain    Dyspnea    on exertion   Family history of breast cancer 09/22/2020   Hypertension    Multiple lung nodules on CT 2022   Personal history of chemotherapy    Personal history of radiation therapy     Past Surgical History:  Procedure Laterality Date   ABDOMINAL HYSTERECTOMY     APPENDECTOMY     BREAST LUMPECTOMY WITH RADIOACTIVE SEED AND SENTINEL LYMPH NODE BIOPSY Right 09/10/2020   Procedure: RIGHT BREAST LUMPECTOMY WITH RADIOACTIVE SEED AND SENTINEL LYMPH NODE MAPPING;  Surgeon: Vanderbilt Ned, MD;  Location: [facility];  Service: General;  Laterality: Right;   COLONOSCOPY WITH PROPOFOL  N/A 10/21/2023   Procedure: COLONOSCOPY WITH PROPOFOL ;  Surgeon: Cindie Carlin POUR, DO;  [facility];  Service: Endoscopy;  Laterality: N/A;  11:00am, asa 3   POLYPECTOMY  10/21/2023   Procedure: POLYPECTOMY INTESTINAL;  Surgeon: Cindie Carlin POUR, DO;  [facility];  Service: Endoscopy;;   PORTACATH PLACEMENT Right 10/16/2020   Procedure: INSERTION PORT-A-CATH WITH ULTRASOUND GUIDANCE;  Surgeon: Vanderbilt Ned, MD;  Location: [facility];  Service: General;  Laterality: Right;   RE-EXCISION OF BREAST LUMPECTOMY Right 10/16/2020   Procedure: RE-EXCISION OF RIGHT BREAST LUMPECTOMY;  Surgeon: Vanderbilt Ned, MD;  Location: [facility];  Service: General;  Laterality:  Right;   REVERSE SHOULDER ARTHROPLASTY Left 11/16/2022   Procedure: LEFT REVERSE SHOULDER ARTHROPLASTY;  Surgeon: Genelle Standing, MD;  Location: [facility];  Service: Orthopedics;  Laterality: Left;    Family History  Problem Relation Age of Onset   Hypertension Father    Hypertension Sister    Hypertension Maternal Grandmother    Breast cancer Maternal Aunt 39   Breast cancer Paternal Aunt        dx at unknown age   Breast cancer Other        MGM's niece; dx 80s   Breast cancer Other        MGF's niece    Social History   Socioeconomic History   Marital status: Single    Spouse name: Not on file   Number of children: Not on file   Years of education: Not on file   Highest education level: Not on file  Occupational History   Not on file  Tobacco Use   Smoking status: Former    Current packs/day: 0.00    Average packs/day: 0.5 packs/day for 35.0 years (17.5 ttl pk-yrs)    Types: Cigarettes    Start date: 08/22/1985    Quit date: 08/22/2020    Years since quitting: 3.8   Smokeless tobacco: Never  Vaping Use   Vaping status: Never Used  Substance and Sexual Activity   Alcohol use: No   Drug use: Never  Sexual activity: Not Currently    Birth control/protection: Surgical  Other Topics Concern   Not on file  Social History Narrative   Not on file   Social Drivers of Health   Financial Resource Strain: Low Risk  (05/23/2024)   Overall Financial Resource Strain (CARDIA)    Difficulty of Paying Living Expenses: Not hard at all  Food Insecurity: No Food Insecurity (05/23/2024)   Hunger Vital Sign    Worried About Running Out of Food in the Last Year: Never true    Ran Out of Food in the Last Year: Never true  Transportation Needs: No Transportation Needs (05/23/2024)   PRAPARE - Administrator, Civil Service (Medical): No    Lack of Transportation (Non-Medical): No  Physical Activity: Inactive (05/23/2024)   Exercise Vital Sign    Days of  Exercise per Week: 0 days    Minutes of Exercise per Session: 0 min  Stress: Stress Concern Present (05/23/2024)   [employer] of Occupational Health - Occupational Stress Questionnaire    Feeling of Stress: Rather much  Social Connections: Moderately Isolated (05/23/2024)   Social Connection and Isolation Panel    Frequency of Communication with Friends and Family: More than three times a week    Frequency of Social Gatherings with Friends and Family: Twice a week    Attends Religious Services: 1 to 4 times per year    Active Member of [employer] or Organizations: No    Attends Banker Meetings: Never    Marital Status: Never married  Intimate Partner Violence: Not At Risk (05/23/2024)   Humiliation, Afraid, Rape, and Kick questionnaire    Fear of Current or Ex-Partner: No    Emotionally Abused: No    Physically Abused: No    Sexually Abused: No    Outpatient Medications Prior to Visit  Medication Sig Dispense Refill   albuterol  (PROVENTIL ) (2.5 MG/3ML) 0.083% nebulizer solution Take 3 mLs (2.5 mg total) by nebulization every 6 (six) hours as needed for wheezing or shortness of breath. 75 mL 1   albuterol  (VENTOLIN  HFA) 108 (90 Base) MCG/ACT inhaler Inhale 2 puffs into the lungs every 4 (four) hours as needed for wheezing or shortness of breath. 8 g 3   budesonide -formoterol  (SYMBICORT ) 160-4.5 MCG/ACT inhaler Inhale 2 puffs into the lungs in the morning and at bedtime. 3 each 3   calcium citrate (CALCITRATE - DOSED IN MG ELEMENTAL CALCIUM) 950 (200 Ca) MG tablet Take 200 mg of elemental calcium by mouth daily.     clonazePAM (KLONOPIN) 0.5 MG tablet Take 0.5 mg by mouth daily as needed.     diazepam  (VALIUM ) 5 MG tablet Take one tablet by mouth with food one hour prior to procedure. May repeat 30 minutes prior if needed. 2 tablet 0   escitalopram (LEXAPRO) 20 MG tablet Take by mouth.     HYDROcodone -acetaminophen  (NORCO) 5-325 MG tablet Take 1 tablet by mouth 3 (three)  times daily as needed for moderate pain (pain score 4-6). 90 tablet 0   letrozole  (FEMARA ) 2.5 MG tablet Take 1 tablet (2.5 mg total) by mouth daily. 90 tablet 3   prazosin (MINIPRESS) 1 MG capsule take 1 capsule by mouth daily; for blood pressure 90 capsule 0   temazepam (RESTORIL) 15 MG capsule Take 15 mg by mouth at bedtime as needed for sleep.     lisinopril  (ZESTRIL ) 10 MG tablet take 1 tablet (10 MILLIGRAM total) by mouth daily. 90 tablet 0  No facility-administered medications prior to visit.    Allergies  Allergen Reactions   Oxycodone  Itching and Swelling    ROS Review of Systems  Constitutional:  Negative for chills and fever.  Eyes:  Negative for visual disturbance.  Respiratory:  Negative for chest tightness and shortness of breath.   Neurological:  Negative for dizziness and headaches.      Objective:    Physical Exam HENT:     Head: Normocephalic.     Mouth/Throat:     Mouth: Mucous membranes are moist.  Cardiovascular:     Rate and Rhythm: Normal rate.     Heart sounds: Normal heart sounds.  Pulmonary:     Effort: Pulmonary effort is normal.     Breath sounds: Normal breath sounds.  Neurological:     Mental Status: She is alert.     BP 130/89   Pulse 81   Resp 16   Ht 5' 4 (1.626 m)   Wt 199 lb 1.9 oz (90.3 kg)   SpO2 97%   BMI 34.18 kg/m  Wt Readings from Last 3 Encounters:  06/11/24 199 lb 1.9 oz (90.3 kg)  05/30/24 201 lb (91.2 kg)  05/23/24 196 lb (88.9 kg)    Lab Results  Component Value Date   TSH 1.560 08/31/2023   Lab Results  Component Value Date   WBC 7.0 08/31/2023   HGB 13.2 08/31/2023   HCT 41.6 08/31/2023   MCV 85 08/31/2023   PLT 228 08/31/2023   Lab Results  Component Value Date   NA 142 08/31/2023   K 4.1 08/31/2023   CO2 22 08/31/2023   GLUCOSE 87 08/31/2023   BUN 7 08/31/2023   CREATININE 0.85 08/31/2023   BILITOT 0.5 08/31/2023   ALKPHOS 141 (H) 08/31/2023   AST 33 08/31/2023   ALT 25 08/31/2023   PROT  7.0 08/31/2023   ALBUMIN 4.3 08/31/2023   CALCIUM 9.8 08/31/2023   ANIONGAP 9 07/31/2022   EGFR 82 08/31/2023   Lab Results  Component Value Date   CHOL 166 08/31/2023   Lab Results  Component Value Date   HDL 52 08/31/2023   Lab Results  Component Value Date   LDLCALC 98 08/31/2023   Lab Results  Component Value Date   TRIG 83 08/31/2023   Lab Results  Component Value Date   CHOLHDL 3.2 08/31/2023   Lab Results  Component Value Date   HGBA1C 5.5 08/31/2023      Assessment & Plan:  Primary hypertension Assessment & Plan: Controlled Encouraged to continue taking lisinopril  10 mg daily as prescribed A low-sodium diet of less than 2,300 mg daily is recommended, along with moderate-intensity physical activity for at least 150 minutes per week. The patient is encouraged to maintain these lifestyle modifications to help manage her blood pressure effectively.  Long-term considerations were discussed, emphasizing that uncontrolled hypertension increases the risk of cardiovascular diseases, including stroke, coronary artery disease, and heart failure.  The patient is encouraged to seek emergency care if blood pressure exceeds 180/120 and is accompanied by symptoms such as headaches, chest pain, palpitations, blurred vision, or dizziness. She verbalized understanding and will follow up as scheduled.  BP Readings from Last 3 Encounters:  06/11/24 130/89  05/30/24 109/77  03/28/24 110/72     Orders: -     Lisinopril ; Take 1 tablet (10 mg total) by mouth daily.  Dispense: 90 tablet; Refill: 1  IFG (impaired fasting glucose) -     Hemoglobin A1c  Vitamin D  deficiency -     VITAMIN D  25 Hydroxy (Vit-D Deficiency, Fractures)  TSH (thyroid-stimulating hormone deficiency) -     TSH + free T4  Other hyperlipidemia -     Lipid panel -     CMP14+EGFR -     CBC with Differential/Platelet  Note: This chart has been completed using Engineer, civil (consulting) software, and while  attempts have been made to ensure accuracy, certain words and phrases may not be transcribed as intended.    Follow-up: Return in about 4 months (around 10/12/2024).   Asiya Cutbirth, FNP

## 2024-06-13 DIAGNOSIS — E7849 Other hyperlipidemia: Secondary | ICD-10-CM | POA: Diagnosis not present

## 2024-06-13 DIAGNOSIS — R7301 Impaired fasting glucose: Secondary | ICD-10-CM | POA: Diagnosis not present

## 2024-06-13 DIAGNOSIS — E559 Vitamin D deficiency, unspecified: Secondary | ICD-10-CM | POA: Diagnosis not present

## 2024-06-13 DIAGNOSIS — E038 Other specified hypothyroidism: Secondary | ICD-10-CM | POA: Diagnosis not present

## 2024-06-14 ENCOUNTER — Ambulatory Visit: Payer: Self-pay | Admitting: Family Medicine

## 2024-06-14 LAB — CBC WITH DIFFERENTIAL/PLATELET
Basophils Absolute: 0.1 x10E3/uL (ref 0.0–0.2)
Basos: 1 %
EOS (ABSOLUTE): 0.2 x10E3/uL (ref 0.0–0.4)
Eos: 2 %
Hematocrit: 42.6 % (ref 34.0–46.6)
Hemoglobin: 13.9 g/dL (ref 11.1–15.9)
Immature Grans (Abs): 0 x10E3/uL (ref 0.0–0.1)
Immature Granulocytes: 0 %
Lymphocytes Absolute: 2.6 x10E3/uL (ref 0.7–3.1)
Lymphs: 30 %
MCH: 28 pg (ref 26.6–33.0)
MCHC: 32.6 g/dL (ref 31.5–35.7)
MCV: 86 fL (ref 79–97)
Monocytes Absolute: 0.6 x10E3/uL (ref 0.1–0.9)
Monocytes: 7 %
Neutrophils Absolute: 5.2 x10E3/uL (ref 1.4–7.0)
Neutrophils: 60 %
Platelets: 250 x10E3/uL (ref 150–450)
RBC: 4.97 x10E6/uL (ref 3.77–5.28)
RDW: 15.1 % (ref 11.7–15.4)
WBC: 8.6 x10E3/uL (ref 3.4–10.8)

## 2024-06-14 LAB — CMP14+EGFR
ALT: 24 IU/L (ref 0–32)
AST: 28 IU/L (ref 0–40)
Albumin: 4.2 g/dL (ref 3.8–4.9)
Alkaline Phosphatase: 145 IU/L — ABNORMAL HIGH (ref 44–121)
BUN/Creatinine Ratio: 9 (ref 9–23)
BUN: 8 mg/dL (ref 6–24)
Bilirubin Total: 0.4 mg/dL (ref 0.0–1.2)
CO2: 21 mmol/L (ref 20–29)
Calcium: 9.3 mg/dL (ref 8.7–10.2)
Chloride: 104 mmol/L (ref 96–106)
Creatinine, Ser: 0.87 mg/dL (ref 0.57–1.00)
Globulin, Total: 2.7 g/dL (ref 1.5–4.5)
Glucose: 79 mg/dL (ref 70–99)
Potassium: 4.2 mmol/L (ref 3.5–5.2)
Sodium: 139 mmol/L (ref 134–144)
Total Protein: 6.9 g/dL (ref 6.0–8.5)
eGFR: 80 mL/min/1.73 (ref >59)

## 2024-06-14 LAB — LIPID PANEL
Chol/HDL Ratio: 2.8 ratio (ref 0.0–4.4)
Cholesterol, Total: 162 mg/dL (ref 100–199)
HDL: 57 mg/dL (ref >39)
LDL Chol Calc (NIH): 91 mg/dL (ref 0–99)
Triglycerides: 71 mg/dL (ref 0–149)
VLDL Cholesterol Cal: 14 mg/dL (ref 5–40)

## 2024-06-14 LAB — HEMOGLOBIN A1C
Est. average glucose Bld gHb Est-mCnc: 105 mg/dL
Hgb A1c MFr Bld: 5.3 % (ref 4.8–5.6)

## 2024-06-14 LAB — TSH+FREE T4
Free T4: 1.18 ng/dL (ref 0.82–1.77)
TSH: 2.59 u[IU]/mL (ref 0.450–4.500)

## 2024-06-14 LAB — VITAMIN D 25 HYDROXY (VIT D DEFICIENCY, FRACTURES): Vit D, 25-Hydroxy: 37.4 ng/mL (ref 30.0–100.0)

## 2024-06-14 NOTE — Progress Notes (Signed)
 Please inform the patient that her labs are stable

## 2024-06-27 ENCOUNTER — Other Ambulatory Visit: Payer: Self-pay | Admitting: Family Medicine
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
## 2024-06-29 ENCOUNTER — Encounter: Payer: Self-pay | Admitting: Registered Nurse

## 2024-06-29 ENCOUNTER — Encounter: Attending: Physical Medicine and Rehabilitation | Admitting: Registered Nurse

## 2024-06-29 VITALS — BP 108/75 | HR 91 | Ht 64.0 in | Wt 200.8 lb

## 2024-06-29 DIAGNOSIS — M545 Low back pain, unspecified: Secondary | ICD-10-CM | POA: Diagnosis not present

## 2024-06-29 DIAGNOSIS — G894 Chronic pain syndrome: Secondary | ICD-10-CM | POA: Insufficient documentation

## 2024-06-29 DIAGNOSIS — M5416 Radiculopathy, lumbar region: Secondary | ICD-10-CM | POA: Insufficient documentation

## 2024-06-29 DIAGNOSIS — M25552 Pain in left hip: Secondary | ICD-10-CM | POA: Diagnosis not present

## 2024-06-29 DIAGNOSIS — Z79891 Long term (current) use of opiate analgesic: Secondary | ICD-10-CM | POA: Diagnosis not present

## 2024-06-29 DIAGNOSIS — G8929 Other chronic pain: Secondary | ICD-10-CM | POA: Insufficient documentation

## 2024-06-29 DIAGNOSIS — Z5181 Encounter for therapeutic drug level monitoring: Secondary | ICD-10-CM | POA: Diagnosis not present

## 2024-06-29 MED ORDER — HYDROCODONE-ACETAMINOPHEN 5-325 MG PO TABS: 1.0000 | ORAL_TABLET | Freq: Three times a day (TID) | ORAL | 0 refills | Status: DC | PRN

## 2024-07-02 ENCOUNTER — Ambulatory Visit (HOSPITAL_COMMUNITY)
Admission: RE | Admit: 2024-07-02 | Discharge: 2024-07-02 | Disposition: A | Discharge status: Home / Self Care | Source: Ambulatory Visit | Attending: Registered Nurse | Admitting: Registered Nurse

## 2024-07-02 DIAGNOSIS — M160 Bilateral primary osteoarthritis of hip: Secondary | ICD-10-CM | POA: Diagnosis not present

## 2024-07-02 DIAGNOSIS — G8929 Other chronic pain: Secondary | ICD-10-CM | POA: Diagnosis not present

## 2024-07-02 DIAGNOSIS — M4319 Spondylolisthesis, multiple sites in spine: Secondary | ICD-10-CM | POA: Diagnosis not present

## 2024-07-02 DIAGNOSIS — M4807 Spinal stenosis, lumbosacral region: Secondary | ICD-10-CM | POA: Diagnosis not present

## 2024-07-02 DIAGNOSIS — M545 Low back pain, unspecified: Secondary | ICD-10-CM | POA: Diagnosis not present

## 2024-07-02 DIAGNOSIS — M25552 Pain in left hip: Secondary | ICD-10-CM | POA: Insufficient documentation

## 2024-07-02 DIAGNOSIS — M48061 Spinal stenosis, lumbar region without neurogenic claudication: Secondary | ICD-10-CM | POA: Diagnosis not present

## 2024-07-02 DIAGNOSIS — M5137 Other intervertebral disc degeneration, lumbosacral region with discogenic back pain only: Secondary | ICD-10-CM | POA: Diagnosis not present

## 2024-07-04 LAB — TOXASSURE SELECT,+ANTIDEPR,UR

## 2024-07-13 ENCOUNTER — Telehealth: Payer: Self-pay | Admitting: Registered Nurse
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
## 2024-07-16 ENCOUNTER — Telehealth: Payer: Self-pay | Admitting: Registered Nurse

## 2024-07-16 MED ORDER — METHOCARBAMOL 500 MG PO TABS: 500.0000 mg | ORAL_TABLET | Freq: Two times a day (BID) | ORAL | 0 refills | Status: AC | PRN

## 2024-07-16 NOTE — Telephone Encounter (Signed)
 Call placed to Ms. Lafavor, reviewed Dr Emeline recommendation.  Methocarbamol  prescribed to pharmacy. She verbalizes understanding.

## 2024-07-16 NOTE — Telephone Encounter (Signed)
 error

## 2024-07-26 ENCOUNTER — Ambulatory Visit (HOSPITAL_COMMUNITY)
Admission: RE | Admit: 2024-07-26 | Discharge: 2024-07-26 | Disposition: A | Discharge status: Home / Self Care | Source: Ambulatory Visit | Attending: Hematology and Oncology | Admitting: Hematology and Oncology

## 2024-07-26 ENCOUNTER — Other Ambulatory Visit: Payer: Medicare Other

## 2024-07-26 DIAGNOSIS — Z780 Asymptomatic menopausal state: Secondary | ICD-10-CM | POA: Diagnosis not present

## 2024-07-26 DIAGNOSIS — M810 Age-related osteoporosis without current pathological fracture: Secondary | ICD-10-CM | POA: Diagnosis not present

## 2024-08-02 ENCOUNTER — Ambulatory Visit: Admitting: Adult Health

## 2024-08-03 ENCOUNTER — Encounter: Payer: Self-pay | Admitting: Adult Health

## 2024-08-03 ENCOUNTER — Inpatient Hospital Stay: Attending: Adult Health | Admitting: Adult Health

## 2024-08-03 VITALS — BP 140/89 | HR 90 | Temp 97.7°F | Resp 18 | Ht 64.0 in | Wt 198.8 lb

## 2024-08-03 DIAGNOSIS — Z7983 Long term (current) use of bisphosphonates: Secondary | ICD-10-CM | POA: Insufficient documentation

## 2024-08-03 DIAGNOSIS — Z90710 Acquired absence of both cervix and uterus: Secondary | ICD-10-CM | POA: Diagnosis not present

## 2024-08-03 DIAGNOSIS — Z923 Personal history of irradiation: Secondary | ICD-10-CM | POA: Diagnosis not present

## 2024-08-03 DIAGNOSIS — Z803 Family history of malignant neoplasm of breast: Secondary | ICD-10-CM | POA: Diagnosis not present

## 2024-08-03 DIAGNOSIS — Z87891 Personal history of nicotine dependence: Secondary | ICD-10-CM | POA: Diagnosis not present

## 2024-08-03 DIAGNOSIS — C50411 Malignant neoplasm of upper-outer quadrant of right female breast: Secondary | ICD-10-CM | POA: Insufficient documentation

## 2024-08-03 DIAGNOSIS — Z1731 Human epidermal growth factor receptor 2 positive status: Secondary | ICD-10-CM | POA: Insufficient documentation

## 2024-08-03 DIAGNOSIS — Z170 Estrogen receptor positive status [ER+]: Secondary | ICD-10-CM | POA: Insufficient documentation

## 2024-08-03 DIAGNOSIS — M810 Age-related osteoporosis without current pathological fracture: Secondary | ICD-10-CM | POA: Insufficient documentation

## 2024-08-03 DIAGNOSIS — M816 Localized osteoporosis [Lequesne]: Secondary | ICD-10-CM | POA: Diagnosis not present

## 2024-08-03 DIAGNOSIS — Z79811 Long term (current) use of aromatase inhibitors: Secondary | ICD-10-CM | POA: Diagnosis not present

## 2024-08-03 DIAGNOSIS — Z9221 Personal history of antineoplastic chemotherapy: Secondary | ICD-10-CM | POA: Insufficient documentation

## 2024-08-03 DIAGNOSIS — Z1721 Progesterone receptor positive status: Secondary | ICD-10-CM | POA: Diagnosis not present

## 2024-08-03 MED ORDER — ALENDRONATE SODIUM 70 MG PO TABS: 70.0000 mg | ORAL_TABLET | ORAL | 3 refills | Status: AC

## 2024-08-03 NOTE — Progress Notes (Signed)
 [facility] Cancer Follow up:    [name], [name], FNP [address] [place] [address]   DIAGNOSIS:  Cancer Staging  Primary malignant neoplasm of upper outer quadrant of female breast, right [facility]) Staging form: Breast, AJCC 8th Edition - Clinical stage from 08/26/2020: Stage IA (cT1b, cN0, cM0, G2, ER+, PR+, HER2+) - Signed by Izell Domino, MD on 08/27/2020 Stage prefix: Initial diagnosis Histologic grading system: 3 grade system    SUMMARY OF ONCOLOGIC HISTORY: Oncology History  Primary malignant neoplasm of upper outer quadrant of female breast, right (HCC)  08/12/2020 Initial Diagnosis   Screening mammogram on 07/07/20 showed an asymmetry. Diagnostic mammogram and US  on 07/29/20 showed a 0.8cm mass at the 10 o'clock position and no right axillary adenopathy. Biopsy on 08/12/20 showed invasive and in situ mammary carcinoma, grade 2, HER-2 equivocal by IHC, positive by FISH, ER+ 80%, PR+ 60%, Ki67 15%.   08/26/2020 Cancer Staging   Staging form: Breast, AJCC 8th Edition - Clinical stage from 08/26/2020: Stage IA (cT1b, cN0, cM0, G2, ER+, PR+, HER2+) - Signed by Izell Domino, MD on 08/27/2020   09/10/2020 Surgery   Right lumpectomy ([facility]): invasive lobular carcinoma, 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   11/03/2020 - 10/20/2021 Chemotherapy   Patient is on Treatment Plan : BREAST Paclitaxel  + Trastuzumab  q7d / Trastuzumab  q21d      Genetic Testing   Negative genetic testing: no pathogenic variants detected in [facility] Multi-Cancer Panel.  Variants of uncertain significance detected in ATM (c.131A>G (p.Asp44Gly) and c.4658A>C (p.Glu1553Ala)) and WRN (c.2825+6G>T (intronic)).  The report date is October 02, 2020.   The Multi-Cancer Panel offered by [facility] includes sequencing and/or deletion duplication testing of the following 85 genes: AIP, ALK,  APC, ATM, AXIN2,BAP1,  BARD1, BLM, BMPR1A, BRCA1, BRCA2, BRIP1, CASR, CDC73, CDH1, CDK4, CDKN1B, CDKN1C, CDKN2A (p14ARF), CDKN2A (p16INK4a), CEBPA, CHEK2, CTNNA1, DICER1, DIS3L2, EGFR (c.2369C>T, p.Thr790Met variant only), EPCAM (Deletion/duplication testing only), FH, FLCN, GATA2, GPC3, GREM1 (Promoter region deletion/duplication testing only), HOXB13 (c.251G>A, p.Gly84Glu), HRAS, KIT, MAX, MEN1, MET, MITF (c.952G>A, p.Glu318Lys variant only), MLH1, MSH2, MSH3, MSH6, MUTYH, NBN, NF1, NF2, NTHL1, PALB2, PDGFRA, PHOX2B, PMS2, POLD1, POLE, POT1, PRKAR1A, PTCH1, PTEN, RAD50, RAD51C, RAD51D, RB1, RECQL4, RET, RNF43, RUNX1, SDHAF2, SDHA (sequence changes only), SDHB, SDHC, SDHD, SMAD4, SMARCA4, SMARCB1, SMARCE1, STK11, SUFU, TERC, TERT, TMEM127, TP53, TSC1, TSC2, VHL, WRN and WT1.   Amended report: The variant of uncertain significance (VUS) in WRN at  c.2825+6G>T (Intronic) has been reclassified to likely benign.  The change in variant classification was made as a result of re-review of evidence in light of new variant interpretation guidelines and/or new information. The amended report date is December 18, 2020.    02/10/2021 - 03/24/2021 Radiation Therapy   Site Technique Total Dose (Gy) Dose per Fx (Gy) Completed Fx Beam Energies  Breast, Right: Breast_Rt 3D 50/50 2 25/25 10X  Breast, Right: Breast_Rt_PAB_SCV 3D 50/50 2 25/25 6X, 15X  Breast, Right: Breast_Rt_Bst 3D 10/10 2 5/5 6X, 10X     04/13/2021 -  Anti-estrogen oral therapy   Initially anastrozole  switched to letrozole  for fatigue issues     CURRENT THERAPY:Letrozole   INTERVAL HISTORY: Discussed the use of AI scribe software for clinical note transcription with the patient, who gave verbal consent to proceed.  History of Present Illness Monica [name] is a 53 year old female with stage IA ER, PR positive right  breast invasive lobular carcinoma who presents for a follow-up visit.  Diagnosed with stage IA ER, PR positive right breast  invasive lobular carcinoma in September 2021, she underwent a lumpectomy, adjuvant chemotherapy with Taxol  and Herceptin , maintenance Herceptin , adjuvant radiation, and is currently on antiestrogen therapy with letrozole .  The most recent bilateral breast mammogram on October 03, 2023, shows no mammographic evidence of malignancy and breast density category B. Bone density testing reveals osteoporosis in the left forearm with a T score of -5.3, osteoporosis in the spine and forearm, and osteopenia in the hips.  She experiences persistent pain since undergoing chemotherapy. Her vitamin D  levels are normal, and she is currently taking calcium supplements.  Patient Active Problem List   Diagnosis Date Noted   Viral illness 02/12/2024   Myofascial pain on left side 12/28/2023   Chronic pain syndrome 12/28/2023   Impaired mobility and ADLs 12/28/2023   Obesity (BMI 30-39.9) 10/01/2023   Cervical cancer screening 09/15/2023   Moderate persistent asthma 08/31/2023   Hypertension 08/31/2023   Difficulty sleeping 07/31/2023   Depression, recurrent (HCC) 03/24/2023   Cervical radiculopathy 03/24/2023   Primary osteoarthritis, left shoulder 11/16/2022   Spondylolisthesis at L5-S1 level 01/04/2022   Lumbar radiculopathy 01/04/2022   Trochanteric bursitis of left hip 11/10/2021   Muscle cramp, nocturnal 11/10/2021   Port-A-Cath in place 12/29/2020   Genetic testing 10/08/2020   Family history of breast cancer 09/22/2020   Primary malignant neoplasm of upper outer quadrant of female breast, right (HCC) 08/27/2020    is allergic to oxycodone .  MEDICAL HISTORY: Past Medical History:  Diagnosis Date   Arthritis    Asthma    Back pain    Breast cancer (HCC)    Buttock pain    Dyspnea    on exertion   Family history of breast cancer 09/22/2020   Hypertension    Multiple lung nodules on CT 2022   Personal history of chemotherapy    Personal history of radiation therapy     SURGICAL  HISTORY: Past Surgical History:  Procedure Laterality Date   ABDOMINAL HYSTERECTOMY     APPENDECTOMY     BREAST LUMPECTOMY WITH RADIOACTIVE SEED AND SENTINEL LYMPH NODE BIOPSY Right 09/10/2020   Procedure: RIGHT BREAST LUMPECTOMY WITH RADIOACTIVE SEED AND SENTINEL LYMPH NODE MAPPING;  Surgeon: Vanderbilt Ned, MD;  Location: [facility];  Service: General;  Laterality: Right;   COLONOSCOPY WITH PROPOFOL  N/A 10/21/2023   Procedure: COLONOSCOPY WITH PROPOFOL ;  Surgeon: Cindie Carlin POUR, DO;  [facility];  Service: Endoscopy;  Laterality: N/A;  11:00am, asa 3   POLYPECTOMY  10/21/2023   Procedure: POLYPECTOMY INTESTINAL;  Surgeon: Cindie Carlin POUR, DO;  Location: [facility];  Service: Endoscopy;;   PORTACATH PLACEMENT Right 10/16/2020   Procedure: INSERTION PORT-A-CATH WITH ULTRASOUND GUIDANCE;  Surgeon: Vanderbilt Ned, MD;  Location: [facility];  Service: General;  Laterality: Right;   RE-EXCISION OF BREAST LUMPECTOMY Right 10/16/2020   Procedure: RE-EXCISION OF RIGHT BREAST LUMPECTOMY;  Surgeon: Vanderbilt Ned, MD;  Location: [facility];  Service: General;  Laterality: Right;   REVERSE SHOULDER ARTHROPLASTY Left 11/16/2022   Procedure: LEFT REVERSE SHOULDER ARTHROPLASTY;  Surgeon: Genelle Standing, MD;  Location: [facility];  Service: Orthopedics;  Laterality: Left;    SOCIAL HISTORY: Social History   Socioeconomic History   Marital status: Single    Spouse name: Not on file   Number of children: Not on file   Years of  education: Not on file   Highest education level: Not on file  Occupational History   Not on file  Tobacco Use   Smoking status: Former    Current packs/day: 0.00    Average packs/day: 0.5 packs/day for 35.0 years (17.5 ttl pk-yrs)    Types: Cigarettes    Start date: 08/22/1985    Quit date: 08/22/2020    Years since quitting: 3.9   Smokeless tobacco: Never  Vaping Use   Vaping status:  Never Used  Substance and Sexual Activity   Alcohol use: No   Drug use: Never   Sexual activity: Not Currently    Birth control/protection: Surgical  Other Topics Concern   Not on file  Social History Narrative   Not on file   Social Drivers of Health   Financial Resource Strain: Low Risk  (05/23/2024)   Overall Financial Resource Strain (CARDIA)    Difficulty of Paying Living Expenses: Not hard at all  Food Insecurity: No Food Insecurity (05/23/2024)   Hunger Vital Sign    Worried About Running Out of Food in the Last Year: Never true    Ran Out of Food in the Last Year: Never true  Transportation Needs: No Transportation Needs (05/23/2024)   PRAPARE - Administrator, Civil Service (Medical): No    Lack of Transportation (Non-Medical): No  Physical Activity: Inactive (05/23/2024)   Exercise Vital Sign    Days of Exercise per Week: 0 days    Minutes of Exercise per Session: 0 min  Stress: Stress Concern Present (05/23/2024)   [employer] of Occupational Health - Occupational Stress Questionnaire    Feeling of Stress: Rather much  Social Connections: Moderately Isolated (05/23/2024)   Social Connection and Isolation Panel    Frequency of Communication with Friends and Family: More than three times a week    Frequency of Social Gatherings with Friends and Family: Twice a week    Attends Religious Services: 1 to 4 times per year    Active Member of [employer] or Organizations: No    Attends Banker Meetings: Never    Marital Status: Never married  Intimate Partner Violence: Not At Risk (05/23/2024)   Humiliation, Afraid, Rape, and Kick questionnaire    Fear of Current or Ex-Partner: No    Emotionally Abused: No    Physically Abused: No    Sexually Abused: No    FAMILY HISTORY: Family History  Problem Relation Age of Onset   Hypertension Father    Hypertension Sister    Hypertension Maternal Grandmother    Breast cancer Maternal Aunt 40   Breast  cancer Paternal Aunt        dx at unknown age   Breast cancer Other        MGM's niece; dx 69s   Breast cancer Other        MGF's niece    Review of Systems  Constitutional:  Negative for appetite change, chills, fatigue, fever and unexpected weight change.  HENT:   Negative for hearing loss, lump/mass and trouble swallowing.   Eyes:  Negative for eye problems and icterus.  Respiratory:  Negative for chest tightness, cough and shortness of breath.   Cardiovascular:  Negative for chest pain, leg swelling and palpitations.  Gastrointestinal:  Negative for abdominal distention, abdominal pain, constipation, diarrhea, nausea and vomiting.  Endocrine: Negative for hot flashes.  Genitourinary:  Negative for difficulty urinating.   Musculoskeletal:  Negative for arthralgias.  Skin:  Negative  for itching and rash.  Neurological:  Negative for dizziness, extremity weakness, headaches and numbness.  Hematological:  Negative for adenopathy. Does not bruise/bleed easily.  Psychiatric/Behavioral:  Negative for depression. The patient is not nervous/anxious.       PHYSICAL EXAMINATION   Onc Performance Status - 08/03/24 1000       KPS SCALE   KPS % SCORE Able to carry on normal activity, minor s/s of disease          Vitals:   08/03/24 1043  BP: (!) 140/89  Pulse: 90  Resp: 18  Temp: 97.7 F (36.5 C)  SpO2: 98%    Physical Exam Constitutional:      General: She is not in acute distress.    Appearance: Normal appearance. She is not toxic-appearing.  HENT:     Head: Normocephalic and atraumatic.  Eyes:     General: No scleral icterus. Musculoskeletal:        General: No swelling.  Skin:    General: Skin is warm and dry.     Findings: No rash.  Neurological:     General: No focal deficit present.     Mental Status: She is alert.  Psychiatric:        Mood and Affect: Mood normal.        Behavior: Behavior normal.     ASSESSMENT and THERAPY PLAN:   Primary  malignant neoplasm of upper outer quadrant of female breast, right (HCC) 09/10/2020:Right lumpectomy ([facility]): invasive lobular carcinoma, 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  ER 80%, PR 60%, Ki-67 15%, HER-2 equivocal by IHC positive for fish 10/11/20: Re-excision: Neg   Recommendation: 1. Adjuvant chemotherapy with Taxol  Herceptin  followed by Herceptin  maintenance for 1 year completed November 2022 2. Adjuvant radiation therapy 02/10/21- 03/24/21 3. Adjuvant antiestrogen therapy with anastrozole  (patient is postmenopausal) started 04/13/2021 and anti-HER-2 therapy with neratinib -------------------------------------------------------------------------------------------------------------------------------- Current treatment: Initially anastrozole , switched to letrozole  07/06/2021   Letrozole  Toxicities: Fatigue Left shoulder arthroplasty 11/16/2022   05/23/2022: Progression of groundglass attenuation in the lungs possibly hypersensitivity pneumonitis, unchanged few small lung nodules stable borderline lymph nodes.  Breast cancer surveillance: Mammogram 10/03/2023: Benign breast density category B Breast exam 11/25/2023: Benign  Assessment and Plan Assessment & Plan Osteoporosis of left forearm and spine Osteoporosis with significant bone density decline. No bone metastasis detected. Discussed bone health and fracture risk. - Contact radiology to verify T-score of -5.3 in left forearm since it was previously normal.  - Initiate Fosamax  (alendronate ) once weekly. - Instruct to take Fosamax  with water on an empty stomach and remain upright for one hour. - Continue calcium supplementation. - Provide information on weight-bearing exercises, focusing on upper body. - Repeat bone density testing in two years.  History of right breast invasive lobular carcinoma, status post treatment Stage 1A ER, PR positive right breast invasive lobular  carcinoma, post-treatment with no current malignancy on mammogram. - Continue follow-up with oncology as scheduled. - Continue Letrozole  daily  RTC as scheduled with Dr. Gudena in 11/2024  All questions were answered. The patient knows to call the clinic with any problems, questions or concerns. We can certainly see the patient much sooner if necessary.  Total encounter time:30 minutes*in face-to-face visit time, chart review, lab review, care coordination, order entry, and documentation of the encounter time.    Morna Kendall, NP 08/03/24 4:36 PM Medical Oncology and Hematology [facility] [address] [place], [place] [address] Tel.  [phone]    Fax. [phone]  *Total Encounter Time as defined by the Centers for Medicare and Medicaid Services includes, in addition to the face-to-face time of a patient visit (documented in the note above) non-face-to-face time: obtaining and reviewing outside history, ordering and reviewing medications, tests or procedures, care coordination (communications with other health care professionals or caregivers) and documentation in the medical record.

## 2024-08-03 NOTE — Assessment & Plan Note (Signed)
 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  ER 80%, PR 60%, Ki-67 15%, HER-2 equivocal by IHC positive for fish 10/11/20: Re-excision: Neg   Recommendation: 1. Adjuvant chemotherapy with Taxol  Herceptin  followed by Herceptin  maintenance for 1 year completed November 2022 2. Adjuvant radiation therapy 02/10/21- 03/24/21 3. Adjuvant antiestrogen therapy with anastrozole  (patient is postmenopausal) started 04/13/2021 and anti-HER-2 therapy with neratinib -------------------------------------------------------------------------------------------------------------------------------- Current treatment: Initially anastrozole , switched to letrozole  07/06/2021   Letrozole  Toxicities: Fatigue Left shoulder arthroplasty 11/16/2022   05/23/2022: Progression of groundglass attenuation in the lungs possibly hypersensitivity pneumonitis, unchanged few small lung nodules stable borderline lymph nodes.  Breast cancer surveillance: Mammogram 10/03/2023: Benign breast density category B Breast exam 11/25/2023: Benign  Assessment and Plan Assessment & Plan Osteoporosis of left forearm and spine Osteoporosis with significant bone density decline. No bone metastasis detected. Discussed bone health and fracture risk. - Contact radiology to verify T-score of -5.3 in left forearm since it was previously normal.  - Initiate Fosamax  (alendronate ) once weekly. - Instruct to take Fosamax  with water on an empty stomach and remain upright for one hour. - Continue calcium supplementation. - Provide information on weight-bearing exercises, focusing on upper body. - Repeat bone density testing in two years.  History of right breast invasive lobular carcinoma, status post treatment Stage 1A ER, PR positive right breast invasive lobular carcinoma, post-treatment with no current malignancy on mammogram. -  Continue follow-up with oncology as scheduled. - Continue Letrozole  daily  RTC as scheduled with Dr. Gudena in 11/2024

## 2024-08-03 NOTE — Patient Instructions (Signed)
 Alendronate  Tablets What is this medication? ALENDRONATE  (a LEN droe nate) prevents and treats osteoporosis. It may also be used to treat Paget disease of the bone. It works by Interior and spatial designer stronger and less likely to break (fracture). It belongs to a group of medications called bisphosphonates. This medicine may be used for other purposes; ask your health care provider or pharmacist if you have questions. COMMON BRAND NAME(S): Fosamax  What should I tell my care team before I take this medication? They need to know if you have any of these conditions: Bleeding disorder Cancer Dental disease Difficulty swallowing Infection (fever, chills, cough, sore throat, pain or trouble passing urine) Kidney disease Low levels of calcium or other minerals in the blood Low red blood cell counts Receiving steroids like dexamethasone  or prednisone  Stomach or intestine problems Trouble sitting or standing for 30 minutes An unusual or allergic reaction to alendronate , other medications, foods, dyes or preservatives Pregnant or trying to get pregnant Breast-feeding How should I use this medication? Take this medication by mouth with a full glass of water. Take it as directed on the prescription label at the same time every day. Take the dose right after waking up. Do not eat or drink anything before taking it. Do not take it with any other drink except water. Do not chew or crush the tablet. After taking it, do not eat breakfast, drink, or take any other medications or vitamins for at least 30 minutes. Sit or stand up for at least 30 minutes after you take it. Do not lie down. Keep taking it unless your care team tells you to stop. A special MedGuide will be given to you by the pharmacist with each prescription and refill. Be sure to read this information carefully each time. Talk to your care team about the use of this medication in children. Special care may be needed. Overdosage: If you think you have  taken too much of this medicine contact a poison control center or emergency room at once. NOTE: This medicine is only for you. Do not share this medicine with others. What if I miss a dose? If you take your medication once a day, skip it. Take your next dose at the scheduled time the next morning. Do not take two doses on the same day. If you take your medication once a week, take the missed dose on the morning after you remember. Do not take two doses on the same day. What may interact with this medication? Aluminum hydroxide Antacids Aspirin  Calcium supplements Medications for inflammation like ibuprofen , naproxen, and others Iron supplements Magnesium supplements Vitamins with minerals This list may not describe all possible interactions. Give your health care provider a list of all the medicines, herbs, non-prescription drugs, or dietary supplements you use. Also tell them if you smoke, drink alcohol, or use illegal drugs. Some items may interact with your medicine. What should I watch for while using this medication? Visit your care team for regular checks on your progress. It may be some time before you see the benefit from this medication. Some people who take this medication have severe bone, joint, or muscle pain. This medication may also increase your risk for jaw problems or a broken thigh bone. Tell your care team right away if you have severe pain in your jaw, bones, joints, or muscles. Tell you care team if you have any pain that does not go away or that gets worse. Tell your dentist and dental surgeon that you are  taking this medication. You should not have major dental surgery while on this medication. See your dentist to have a dental exam and fix any dental problems before starting this medication. Take good care of your teeth while on this medication. Make sure you see your dentist for regular follow-up appointments. You should make sure you get enough calcium and vitamin D   while you are taking this medication. Discuss the foods you eat and the vitamins you take with your care team. You may need blood work done while you are taking this medication. What side effects may I notice from receiving this medication? Side effects that you should report to your care team as soon as possible: Allergic reactions--skin rash, itching, hives, swelling of the face, lips, tongue, or throat Low calcium level--muscle pain or cramps, confusion, tingling, or numbness in the hands or feet Osteonecrosis of the jaw--pain, swelling, or redness in the mouth, numbness of the jaw, poor healing after dental work, unusual discharge from the mouth, visible bones in the mouth Pain or trouble swallowing Severe bone, joint, or muscle pain Stomach bleeding--bloody or black, tar-like stools, vomiting blood or brown material that looks like coffee grounds Side effects that usually do not require medical attention (report to your care team if they continue or are bothersome): Constipation Diarrhea Nausea Stomach pain This list may not describe all possible side effects. Call your doctor for medical advice about side effects. You may report side effects to FDA at 1-800-FDA-1088. Where should I keep my medication? Keep out of the reach of children and pets. Store at room temperature between 15 and 30 degrees C (59 and 86 degrees F). Throw away any unused medication after the expiration date. NOTE: This sheet is a summary. It may not cover all possible information. If you have questions about this medicine, talk to your doctor, pharmacist, or health care provider.  2024 Elsevier/Gold Standard (2020-12-04 00:00:00)Bone Health Bones protect organs, store calcium, anchor muscles, and support the whole body. Keeping your bones strong is important, especially as you get older. You can take actions to help keep your bones strong and healthy. Why is keeping my bones healthy important?  Keeping your bones  healthy is important because your body constantly replaces bone cells. Cells get old, and new cells take their place. As we age, we lose bone cells because the body may not be able to make enough new cells to replace the old cells. The amount of bone cells and bone tissue you have is referred to as bone mass. The higher your bone mass, the stronger your bones. The aging process leads to an overall loss of bone mass in the body, which can increase the likelihood of: Broken bones. A condition in which the bones become weak and brittle (osteoporosis). A large decline in bone mass occurs in older adults. In women, it occurs about the time of menopause. What actions can I take to keep my bones healthy? Good health habits are important for maintaining healthy bones. This includes eating nutritious foods and exercising regularly. To have healthy bones, you need to get enough of the right minerals and vitamins. Most nutrition experts recommend getting these nutrients from the foods that you eat. In some cases, taking supplements may also be recommended. Doing certain types of exercise is also important for bone health. What are the nutritional recommendations for healthy bones?  Eating a well-balanced diet with plenty of calcium and vitamin D  will help to protect your bones. Nutritional recommendations vary  from person to person. Ask your health care provider what is healthy for you. Here are some general guidelines. Get enough calcium Calcium is the most important (essential) mineral for bone health. Most people can get enough calcium from their diet, but supplements may be recommended for people who are at risk for osteoporosis. Good sources of calcium include: Dairy products, such as low-fat or nonfat milk, cheese, and yogurt. Dark green leafy vegetables, such as bok choy and broccoli. Foods that have calcium added to them (are fortified). Foods that may be fortified with calcium include orange juice,  cereal, bread, soy beverages, and tofu products. Nuts, such as almonds. Follow these recommended amounts for daily calcium intake: Infants, 0-6 months: 200 mg. Infants, 6-12 months: 260 mg. Children, age 23-3: 700 mg. Children, age 58-8: 1,000 mg. Children, age 77-13: 1,300 mg. Teens, age 70-18: 1,300 mg. Adults, age 8-50: 1,000 mg. Adults, age 38-70: Men: 1,000 mg. Women: 1,200 mg. Adults, age 586 or older: 1,200 mg. Pregnant and breastfeeding females: Teens: 1,300 mg. Adults: 1,000 mg. Get enough vitamin D  Vitamin D  is the most essential vitamin for bone health. It helps the body absorb calcium. Sunlight stimulates the skin to make vitamin D , so be sure to get enough sunlight. If you live in a cold climate or you do not get outside often, your health care provider may recommend that you take vitamin D  supplements. Good sources of vitamin D  in your diet include: Egg yolks. Saltwater fish. Milk and cereal fortified with vitamin D . Follow these recommended amounts for daily vitamin D  intake: Infants, 0-12 months: 400 international units (IU). Children and teens, age 23-18: 600 international units. Adults, age 67 or younger: 600 international units. Adults, age 23 or older: 600-1,000 international units. Get other important nutrients Other nutrients that are important for bone health include: Phosphorus. This mineral is found in meat, poultry, dairy foods, nuts, and legumes. The recommended daily intake for adult men and adult women is 700 mg. Magnesium. This mineral is found in seeds, nuts, dark green vegetables, and legumes. The recommended daily intake for adult men is 400-420 mg. For adult women, it is 310-320 mg. Vitamin K. This vitamin is found in green leafy vegetables. The recommended daily intake is 120 mcg for adult men and 90 mcg for adult women. What type of physical activity is best for building and maintaining healthy bones? Weight-bearing and strength-building activities are  important for building and maintaining healthy bones. Weight-bearing activities cause muscles and bones to work against gravity. Strength-building activities increase the strength of the muscles that support bones. Weight-bearing and muscle-building activities include: Walking and hiking. Jogging and running. Dancing. Gym exercises. Lifting weights. Tennis and racquetball. Climbing stairs. Aerobics. Adults should get at least 30 minutes of moderate physical activity on most days. Children should get at least 60 minutes of moderate physical activity on most days. Ask your health care provider what type of exercise is best for you. How can I find out if my bone mass is low? Bone mass can be measured with an X-ray test called a bone mineral density (BMD) test. This test is recommended for all women who are age 39 or older. It may also be recommended for: Men who are age 32 or older. People who are at risk for osteoporosis because of: Having a long-term disease that weakens bones, such as kidney disease or rheumatoid arthritis. Having menopause earlier than normal. Taking medicine that weakens bones, such as steroids, thyroid hormones, or hormone treatment  for breast cancer or prostate cancer. Smoking. Drinking three or more alcoholic drinks a day. Being underweight. Sedentary lifestyle. If you find that you have a low bone mass, you may be able to prevent osteoporosis or further bone loss by changing your diet and lifestyle. Where can I find more information? Bone Health & Osteoporosis Foundation: https://carlson-fletcher.info/ [employer]: www.bones.http://www.myers.net/ International Osteoporosis Foundation: Investment banker, operational.iofbonehealth.org Summary The aging process leads to an overall loss of bone mass in the body, which can increase the likelihood of broken bones and osteoporosis. Eating a well-balanced diet with plenty of calcium and vitamin D  will help to protect your bones. Weight-bearing and  strength-building activities are also important for building and maintaining strong bones. Bone mass can be measured with an X-ray test called a bone mineral density (BMD) test. This information is not intended to replace advice given to you by your health care provider. Make sure you discuss any questions you have with your health care provider. Document Revised: 05/06/2021 Document Reviewed: 05/06/2021 Elsevier Patient Education  2024 [employer].

## 2024-08-12 ENCOUNTER — Other Ambulatory Visit: Payer: Self-pay

## 2024-08-12 ENCOUNTER — Emergency Department (HOSPITAL_COMMUNITY)

## 2024-08-12 ENCOUNTER — Emergency Department (HOSPITAL_COMMUNITY)
Admission: EM | Admit: 2024-08-12 | Discharge: 2024-08-12 | Disposition: A | Discharge status: Home / Self Care | Attending: Emergency Medicine | Admitting: Emergency Medicine

## 2024-08-12 ENCOUNTER — Encounter (HOSPITAL_COMMUNITY): Payer: Self-pay

## 2024-08-12 DIAGNOSIS — J40 Bronchitis, not specified as acute or chronic: Secondary | ICD-10-CM | POA: Diagnosis not present

## 2024-08-12 DIAGNOSIS — R062 Wheezing: Secondary | ICD-10-CM | POA: Diagnosis present

## 2024-08-12 DIAGNOSIS — R059 Cough, unspecified: Secondary | ICD-10-CM | POA: Diagnosis present

## 2024-08-12 DIAGNOSIS — Z7952 Long term (current) use of systemic steroids: Secondary | ICD-10-CM | POA: Insufficient documentation

## 2024-08-12 DIAGNOSIS — R0989 Other specified symptoms and signs involving the circulatory and respiratory systems: Secondary | ICD-10-CM | POA: Diagnosis not present

## 2024-08-12 DIAGNOSIS — J45909 Unspecified asthma, uncomplicated: Secondary | ICD-10-CM | POA: Insufficient documentation

## 2024-08-12 DIAGNOSIS — Z853 Personal history of malignant neoplasm of breast: Secondary | ICD-10-CM | POA: Diagnosis not present

## 2024-08-12 DIAGNOSIS — Z96612 Presence of left artificial shoulder joint: Secondary | ICD-10-CM | POA: Diagnosis not present

## 2024-08-12 DIAGNOSIS — Z8701 Personal history of pneumonia (recurrent): Secondary | ICD-10-CM | POA: Insufficient documentation

## 2024-08-12 DIAGNOSIS — R058 Other specified cough: Secondary | ICD-10-CM | POA: Diagnosis not present

## 2024-08-12 DIAGNOSIS — Z7951 Long term (current) use of inhaled steroids: Secondary | ICD-10-CM | POA: Insufficient documentation

## 2024-08-12 LAB — RESP PANEL BY RT-PCR (RSV, FLU A&B, COVID)  RVPGX2
Influenza A by PCR: NEGATIVE
Influenza B by PCR: NEGATIVE
Resp Syncytial Virus by PCR: NEGATIVE
SARS Coronavirus 2 by RT PCR: NEGATIVE

## 2024-08-12 MED ORDER — ALBUTEROL SULFATE HFA 108 (90 BASE) MCG/ACT IN AERS
2.0000 | INHALATION_SPRAY | RESPIRATORY_TRACT | Status: DC
  Administered 2024-08-12: 2 via RESPIRATORY_TRACT
  Filled 2024-08-12: qty 6.7

## 2024-08-12 MED ORDER — PREDNISONE 50 MG PO TABS: ORAL_TABLET | ORAL | 0 refills | Status: DC

## 2024-08-12 MED ORDER — PREDNISONE 50 MG PO TABS
60.0000 mg | ORAL_TABLET | Freq: Every day | ORAL | Status: DC
  Administered 2024-08-12: 60 mg via ORAL
  Filled 2024-08-12: qty 1

## 2024-08-12 MED ORDER — IPRATROPIUM-ALBUTEROL 0.5-2.5 (3) MG/3ML IN SOLN
3.0000 mL | Freq: Once | RESPIRATORY_TRACT | Status: AC
  Administered 2024-08-12: 3 mL via RESPIRATORY_TRACT
  Filled 2024-08-12: qty 3

## 2024-08-12 MED ORDER — ALBUTEROL SULFATE (2.5 MG/3ML) 0.083% IN NEBU
2.5000 mg | INHALATION_SOLUTION | RESPIRATORY_TRACT | 2 refills | Status: AC | PRN
Start: 2024-08-12 — End: 2025-08-12

## 2024-08-12 NOTE — Discharge Instructions (Signed)
 Return if any problems.

## 2024-08-12 NOTE — ED Triage Notes (Signed)
 Pt stated that she is congestion and spitting up green stuff. Pt was advised to come here by her oncologist. Currently in remission from breast cancer

## 2024-08-12 NOTE — ED Provider Notes (Signed)
 [place] EMERGENCY DEPARTMENT AT [facility] Provider Note   CSN: [id number] Arrival date & time: 08/12/24  1221     Patient presents with: Nasal Congestion   Monica [name] is a 53 y.o. female.   Patient complains of a cough and congestion.  Patient reports she has a past medical history of asthma.  Patient reports that she is currently out of her albuterol  neb solution.  Patient states that she has a past medical history of breast cancer.  Patient states that she gets frequent bronchitis and has a history of developing pneumonia.  Patient denies any fever or chills.  She states she is coughing up some colored phlegm.  Patient reports her appetite has been good she has not had any nausea or vomiting.        Prior to Admission medications   Medication Sig Start Date End Date Taking? Authorizing Provider  albuterol  (PROVENTIL ) (2.5 MG/3ML) 0.083% nebulizer solution Take 3 mLs (2.5 mg total) by nebulization every 4 (four) hours as needed for wheezing or shortness of breath. 08/12/24 08/12/25 Yes Derrek Puff K, PA-C  predniSONE  (DELTASONE ) 50 MG tablet One tablet a day.  Begin on 9/8 08/12/24  Yes Abem Shaddix K, PA-C  alendronate  (FOSAMAX ) 70 MG tablet Take 1 tablet (70 mg total) by mouth once a week. Take with a full glass of water on an empty stomach. 08/03/24   Causey, Morna Pickle, NP  budesonide -formoterol  (SYMBICORT ) 160-4.5 MCG/ACT inhaler Inhale 2 puffs into the lungs in the morning and at bedtime. 03/28/24 03/28/25  Mannam, Praveen, MD  calcium citrate (CALCITRATE - DOSED IN MG ELEMENTAL CALCIUM) 950 (200 Ca) MG tablet Take 200 mg of elemental calcium by mouth daily.    [provider]  clonazePAM (KLONOPIN) 0.5 MG tablet Take 0.5 mg by mouth daily as needed. 12/10/21   [provider]  escitalopram (LEXAPRO) 20 MG tablet Take by mouth. 04/12/23   [provider]  HYDROcodone -acetaminophen  (NORCO) 5-325 MG tablet Take 1 tablet by mouth 3  (three) times daily as needed for moderate pain (pain score 4-6). 06/29/24   Debby Fidela CROME, NP  letrozole  (FEMARA ) 2.5 MG tablet Take 1 tablet (2.5 mg total) by mouth daily. 11/25/23   Gudena, Vinay, MD  lisinopril  (ZESTRIL ) 10 MG tablet Take 1 tablet (10 mg total) by mouth daily. 06/11/24   Zarwolo, [name], FNP  methocarbamol  (ROBAXIN ) 500 MG tablet Take 1 tablet (500 mg total) by mouth 2 (two) times daily as needed for muscle spasms. 07/16/24   Debby Fidela CROME, NP  prazosin (MINIPRESS) 1 MG capsule take 1 capsule by mouth daily; for blood pressure 06/30/24   Zarwolo, [name], FNP  temazepam (RESTORIL) 15 MG capsule Take 15 mg by mouth at bedtime as needed for sleep.    [provider]    Allergies: Oxycodone     Review of Systems  HENT:  Positive for rhinorrhea.   Respiratory:  Positive for cough.   All other systems reviewed and are negative.   Updated Vital Signs BP (!) 142/95   Pulse 90   Temp 99.1 F (37.3 C)   Resp 18   Ht 5' 4 (1.626 m)   Wt 90 kg   SpO2 98%   BMI 34.06 kg/m   Physical Exam Vitals and nursing note reviewed.  Constitutional:      Appearance: She is well-developed.  HENT:     Head: Normocephalic.  Cardiovascular:     Rate and Rhythm: Normal rate.  Pulmonary:     Effort: Pulmonary effort is normal.     Breath sounds: Wheezing present.  Abdominal:     General: There is no distension.  Musculoskeletal:        General: Normal range of motion.     Cervical back: Normal range of motion.  Skin:    General: Skin is warm.  Neurological:     General: No focal deficit present.     Mental Status: She is alert and oriented to person, place, and time.     (all labs ordered are listed, but only abnormal results are displayed) Labs Reviewed  RESP PANEL BY RT-PCR (RSV, FLU A&B, COVID)  [id number]    EKG: None  Radiology: DG Chest 2 View Result Date: 08/12/2024 CLINICAL DATA:  Productive cough. EXAM: CHEST - 2 VIEW COMPARISON:  February 09, 2024  FINDINGS: The heart size and mediastinal contours are within normal limits. Low lung volumes are noted. No acute infiltrate, pleural effusion or pneumothorax is identified. There is evidence of prior left shoulder arthroplasty. The visualized skeletal structures are otherwise unremarkable. IMPRESSION: No active cardiopulmonary disease. Electronically Signed   By: Suzen Dials M.D.   On: 08/12/2024 13:06     Procedures   Medications Ordered in the ED  predniSONE  (DELTASONE ) tablet 60 mg (60 mg Oral Given 08/12/24 1430)  albuterol  (VENTOLIN  HFA) 108 (90 Base) MCG/ACT inhaler 2 puff (2 puffs Inhalation Given 08/12/24 1431)  ipratropium-albuterol  (DUONEB) 0.5-2.5 (3) MG/3ML nebulizer solution 3 mL (3 mLs Nebulization Given 08/12/24 1431)                                    Medical Decision Making Patient complains of cough and congestion she has a history of asthma she is out of her albuterol  solution.  She does not currently have an albuterol  inhaler.  Amount and/or Complexity of Data Reviewed Labs: ordered. Decision-making details documented in ED Course.    Details: Influenza COVID and RSV ordered reviewed and interpreted testing is negative Radiology: ordered and independent interpretation performed. Decision-making details documented in ED Course.    Details: Chest x-ray ordered reviewed and interpreted no acute findings  Risk Prescription drug management. Risk Details: Patient is given a DuoNeb and 60 mg of prednisone  here.  Patient is given an albuterol  inhaler.  Patient is given a prescription for her albuterol  neb solution and a prescription for prednisone .  She is advised to follow-up with her primary care physician for recheck if symptoms persist.  Patient agrees to return if any shortness of breath or symptoms of pneumonia.        Final diagnoses:  Bronchitis    ED Discharge Orders          Ordered    albuterol  (PROVENTIL ) (2.5 MG/3ML) 0.083% nebulizer solution  Every 4  hours PRN        08/12/24 1420    predniSONE  (DELTASONE ) 50 MG tablet        08/12/24 1420            An After Visit Summary was printed and given to the patient.    Flint Sonny POUR, PA-C 08/12/24 ARDELLA Suzette Pac, MD 08/16/24 [phone]

## 2024-08-15 ENCOUNTER — Ambulatory Visit: Payer: Self-pay

## 2024-08-15 NOTE — Telephone Encounter (Signed)
 FYI Only or Action Required?: FYI only for provider.  Patient was last seen in primary care on 06/11/2024 by Monica [name], [name], FNP.  Called Nurse Triage reporting Shortness of Breath.  Symptoms began Saturday.  Interventions attempted: Prescription medications: medication given at ED to include prednisone , nebulizer Q4 PRN.  Symptoms are: gradually worsening.  Triage Disposition: See PCP When Office is Open (Within 3 Days)  Patient/caregiver understands and will follow disposition?: Yes    Copied from CRM #[id number]. Topic: Clinical - Red Word Triage >> Aug 15, 2024  9:04 AM Willma SAUNDERS wrote: Red Word that prompted transfer to Nurse Triage: Patient was seen on 09/07 in the ED for wheezing, was prescribed predniSONE  (DELTASONE ) 50 MG tablet and has 1 pill left but states she feels like it is getting worse. Reason for Disposition  [1] MODERATE longstanding difficulty breathing (e.g., speaks in phrases, SOB even at rest, pulse 100-120) AND [2] SAME as normal  Answer Assessment - Initial Assessment Questions 1. RESPIRATORY STATUS: Describe your breathing? (e.g., wheezing, shortness of breath, unable to speak, severe coughing)      SOB, wheezing 2. ONSET: When did this breathing problem begin?      Saturday 3. PATTERN Does the difficult breathing come and go, or has it been constant since it started?      constant 4. SEVERITY: How bad is your breathing? (e.g., mild, moderate, severe)      moderate 5. RECURRENT SYMPTOM: Have you had difficulty breathing before? If Yes, ask: When was the last time? and What happened that time?      yes 6. CARDIAC HISTORY: Do you have any history of heart disease? (e.g., heart attack, angina, bypass surgery, angioplasty)      na 7. LUNG HISTORY: Do you have any history of lung disease?  (e.g., pulmonary embolus, asthma, emphysema)     asthma 8. CAUSE: What do you think is causing the breathing problem?      asthma 9. OTHER SYMPTOMS:  Do you have any other symptoms? (e.g., chest pain, cough, dizziness, fever, runny nose)     Coughing 10. O2 SATURATION MONITOR:  Do you use an oxygen  saturation monitor (pulse oximeter) at home? If Yes, ask: What is your reading (oxygen  level) today? What is your usual oxygen  saturation reading? (e.g., 95%)       na 11. PREGNANCY: Is there any chance you are pregnant? When was your last menstrual period?       no 12. TRAVEL: Have you traveled out of the country in the last month? (e.g., travel history, exposures)       Na   Pt states ED on 08/12/2024 and nebulizer every four hours without relief of wheezing and SOB  Protocols used: Breathing Difficulty-A-AH

## 2024-08-16 ENCOUNTER — Ambulatory Visit (INDEPENDENT_AMBULATORY_CARE_PROVIDER_SITE_OTHER): Admitting: Nurse Practitioner

## 2024-08-16 ENCOUNTER — Encounter: Payer: Self-pay | Admitting: Nurse Practitioner

## 2024-08-16 VITALS — BP 125/87 | HR 81 | Temp 98.1°F | Ht 64.0 in | Wt 200.0 lb

## 2024-08-16 DIAGNOSIS — J3489 Other specified disorders of nose and nasal sinuses: Secondary | ICD-10-CM

## 2024-08-16 DIAGNOSIS — J4541 Moderate persistent asthma with (acute) exacerbation: Secondary | ICD-10-CM

## 2024-08-16 DIAGNOSIS — J209 Acute bronchitis, unspecified: Secondary | ICD-10-CM | POA: Diagnosis not present

## 2024-08-16 MED ORDER — PREDNISONE 20 MG PO TABS: ORAL_TABLET | ORAL | 0 refills | Status: DC

## 2024-08-16 MED ORDER — AZITHROMYCIN 250 MG PO TABS: ORAL_TABLET | ORAL | 0 refills | Status: DC

## 2024-08-16 NOTE — Progress Notes (Signed)
 Subjective:    Patient ID: Monica [name], female    DOB: [birth year], 53 y.o.   MRN: [id number]  Shortness of Breath Associated symptoms include wheezing. Pertinent negatives include no chest pain, ear pain, fever, sore throat or vomiting.   Discussed the use of AI scribe software for clinical note transcription with the patient, who gave verbal consent to proceed.  History of Present Illness Monica [name] is a 53 year old female with asthma who presents with persistent wheezing and shortness of breath.  Her mother is present with her today per her request.  She has been experiencing persistent wheezing and shortness of breath since Sunday, along with chest tightness and difficulty expectorating white phlegm. Despite using albuterol  inhalers and a nebulizer every four hours, her symptoms persist. She completed a course of prednisone  starting from a hospital visit on Sunday, but it has not improved her condition.  Environmental factors such as dust, chemicals, and cigarette smoke exacerbate her symptoms. She quit smoking in 2021 but was recently exposed to cigarette smoke in a car with a friend who smokes. Additionally, her maid uses cleaning products that she believes may contribute to her symptoms.  She has a history of breast cancer. She experiences difficulty walking short distances, such as to her mailbox, due to shortness of breath. No chest pain is present, but she reports pressure in the right frontal sinus area when coughing.  Her current medications include albuterol  inhalers, a nebulizer, and Symbicort , which she uses two puffs in the morning and two at night. She uses a spacer with her inhalers to aid in administration. She has not been prescribed antibiotics during this episode, although she recalls previous episodes resolving with antibiotics and prednisone . Does not have a pulse oximeter for home use.   During the review of symptoms, she denies ear pain and  chest pain but reports pressure in her eyes and difficulty breathing. She experiences wheezing and shortness of breath, particularly when walking short distances. Taking fluids well. Voiding normal limit.    Review of Systems  Constitutional:  Positive for fatigue. Negative for fever.  HENT:  Positive for congestion, postnasal drip and sinus pressure. Negative for ear pain and sore throat.   Respiratory:  Positive for cough, chest tightness, shortness of breath and wheezing.   Cardiovascular:  Negative for chest pain.  Gastrointestinal:  Negative for nausea and vomiting.   Social History   Tobacco Use   Smoking status: Former    Current packs/day: 0.00    Average packs/day: 0.5 packs/day for 35.0 years (17.5 ttl pk-yrs)    Types: Cigarettes    Start date: 08/22/1985    Quit date: 08/22/2020    Years since quitting: 3.9   Smokeless tobacco: Never  Vaping Use   Vaping status: Never Used  Substance Use Topics   Alcohol use: No   Drug use: Never       Objective:   Physical Exam Vitals and nursing note reviewed.  Constitutional:      General: She is not in acute distress. HENT:     Ears:     Comments: TMs retracted bilaterally, no erythema.    Nose:     Comments: Nasal mucosa pale and boggy more on the right.    Mouth/Throat:     Mouth: Mucous membranes are moist.     Pharynx: Oropharynx is clear.     Comments: Cloudy PND noted. Neck:     Comments: Mild anterior cervical adenopathy noted bilaterally.  Cardiovascular:     Rate and Rhythm: Normal rate and regular rhythm.     Heart sounds: Normal heart sounds.  Pulmonary:     Effort: Pulmonary effort is normal. No respiratory distress.     Breath sounds: Wheezing present.     Comments: Spasmodic cough noted with deep breaths.  Expiratory wheezes noted throughout lung fields with occasional expiratory crackle.  No tachypnea.  Occasional faint inspiratory wheeze noted. Musculoskeletal:     Cervical back: Neck supple.   Lymphadenopathy:     Cervical: Cervical adenopathy present.  Skin:    General: Skin is warm and dry.  Neurological:     Mental Status: She is alert and oriented to person, place, and time.  Psychiatric:        Mood and Affect: Mood normal.        Behavior: Behavior normal.        Thought Content: Thought content normal.    Today's Vitals   08/16/24 0957  BP: 125/87  Pulse: 81  Temp: 98.1 F (36.7 C)  SpO2: 98%  Weight: 200 lb (90.7 kg)  Height: 5' 4 (1.626 m)   Body mass index is 34.33 kg/m.        Assessment & Plan:  1. Moderate persistent asthma with acute exacerbation (Primary) Acute asthma exacerbation with bronchitis likely due to environmental triggers. Current treatment provides temporary relief. Possible viral infection contributing. - Prescribe Z-Pak for potential bacterial component. - Continue albuterol  inhaler or nebulizer every four hours as needed. - Prescribe new course of prednisone  with tapering dose (60 mg, 40 mg, 20 mg). - Continue Symbicort  as directed through fall and winter months.  - Advise to avoid environmental triggers such as cigarette smoke and chemicals. - Recommend use of pulse oximeter to monitor oxygen  levels at home. Prescription given. - Instruct to seek emergency care if symptoms worsen or oxygen  levels drop below 90%.  2. Acute bronchitis, unspecified organism Zpack as directed.  3. Frontal sinus pain Allergic rhinitis exacerbated by fall allergens causing nasal congestion and sinus pressure. - Advise to avoid allergens such as dust, pollen, and chemical irritants. - Continue use of nasal spray (Flonase or Nasacort ) for allergy management.  Warning signs reviewed. Go to UC/ED if needed. Follow up with PCP if persists.

## 2024-08-24 ENCOUNTER — Other Ambulatory Visit: Payer: Self-pay | Admitting: Hematology and Oncology

## 2024-08-24 DIAGNOSIS — Z853 Personal history of malignant neoplasm of breast: Secondary | ICD-10-CM

## 2024-09-03 ENCOUNTER — Encounter: Attending: Physical Medicine and Rehabilitation | Admitting: Physical Medicine and Rehabilitation

## 2024-09-03 VITALS — BP 106/72 | HR 91 | Ht 64.0 in | Wt 199.0 lb

## 2024-09-03 DIAGNOSIS — M7062 Trochanteric bursitis, left hip: Secondary | ICD-10-CM | POA: Insufficient documentation

## 2024-09-03 DIAGNOSIS — G894 Chronic pain syndrome: Secondary | ICD-10-CM | POA: Diagnosis not present

## 2024-09-03 MED ORDER — HYDROCODONE-ACETAMINOPHEN 5-325 MG PO TABS: 1.0000 | ORAL_TABLET | Freq: Three times a day (TID) | ORAL | 0 refills | Status: DC | PRN

## 2024-09-03 MED ORDER — TRIAMCINOLONE ACETONIDE 40 MG/ML IJ SUSP
40.0000 mg | Freq: Once | INTRAMUSCULAR | Status: AC
  Administered 2024-09-03: 40 mg via INTRAMUSCULAR

## 2024-09-03 MED ORDER — LIDOCAINE HCL 1 % IJ SOLN
2.0000 mL | Freq: Once | INTRAMUSCULAR | Status: AC
  Administered 2024-09-03: 2 mL

## 2024-09-03 NOTE — Progress Notes (Signed)
 HPI: Monica [name] is a 53 y.o. female with PMHx has Primary malignant neoplasm of upper outer quadrant of female breast, right (HCC); Family history of breast cancer; Genetic testing; Port-A-Cath in place; Trochanteric bursitis of left hip; Muscle cramp, nocturnal; Spondylolisthesis at L5-S1 level; Lumbar radiculopathy; Primary osteoarthritis, left shoulder; Depression, recurrent (HCC); Cervical radiculopathy; Difficulty sleeping; Moderate persistent asthma; Hypertension; Cervical cancer screening; Obesity (BMI 30-39.9); Myofascial pain on left side; Chronic pain syndrome; Impaired mobility and ADLs; and Viral illness on their problem list. who presents to clinic for treatment of pain related to L hip pain  via injection as described below.     No new concerns or complaints. No major changes in medical history since last visit. Says she was recently diagnosed with osteoporosis; she is on vitamin D  and calcium supplementation. Following up with Oncology for this.   Shots help considerably, she notices after 2+ months it wears off.    Physical Exam:  General: Appropriate appearance for age.  Mental Status: Appropriate mood and affect.  Cardiovascular: RRR, no m/r/g.  Respiratory: CTAB, no rales/rhonchi/wheezing.  Skin: No apparent rashes or lesions.  Neuro: Awake, alert, and oriented x3. No apparent deficits.  MSK:  Moving all 4 limbs antigravity and against resistance.  + TTP L lateral GTB     PROCEDURE:  Left  greater trochanteric bursa Diagnosis:      ICD-10-CM    1. Trochanteric bursitis of left hip  M70.62         Goals with treatment: [ x ] Decrease pain [  ] Improve Active / Passive ROM [ x ] Improve ADLs [ x ] Improve functional mobility   MEDICATION:  [ x ] Kenalog  40 mg/mL  [ X ] Lidocaine  1%      CONSENT: Obtained in writing per policy. Consent uploaded to chart.   Benefits discussed.  Risks discussed included, but were not limited to, pain and discomfort,  bleeding, bruising, allergic reaction, infection. All questions answered to patient/family member/guardian/ caregiver satisfaction. They would like to proceed with procedure. There are no noted contraindications to procedure.   PROCEDURE Time out was preformed No heat sources No antibiotics   The patient was explained about both the benefits and risks of a Left  Greater trochanteric bursa injection. After the patient acknowledged an understanding of the risks and benefits, the patient agreed to proceed. The area was first marked and then prepped in an aseptic fashion with betadine  / alcohol. A 27 g, 1.5 inch needle was directed via a direct approach into the medial border of the left greater trochanteric bursa  . The injection was completed with Kenalog  40 mg/ml 1 cc mixed with 2 cc of 1% lidocaine  after no blood was aspirated on pull back.   No complications were encountered. The patient tolerated the procedure well.   Impression: HPI: Monica [name] is a 53 y.o. female with PMHx has Primary malignant neoplasm of upper outer quadrant of female breast, right (HCC); Family history of breast cancer; Genetic testing; Port-A-Cath in place; Trochanteric bursitis of left hip; Muscle cramp, nocturnal; Spondylolisthesis at L5-S1 level; Lumbar radiculopathy; Primary osteoarthritis, left shoulder; Depression, recurrent (HCC); Cervical radiculopathy; Difficulty sleeping; Moderate persistent asthma; Hypertension; Cervical cancer screening; Obesity (BMI 30-39.9); Myofascial pain on left side; Chronic pain syndrome; Impaired mobility and ADLs; and Viral illness on their problem list. who presents to clinic for treatment of L hip pain . They received a  Left  GTB as above.    PLAN: -  Resume Usual Activities. Notify Physician of any unusual bleeding, erythema or concern for side effects as reviewed above. - Apply ice prn for pain - Tylenol  prn for pain - Follow up as scheduled   Patient/Care Giver was  ready to learn without apparent learning barriers. Education was provided on diagnosis, treatment options/plan according to patient's preferred learning style. Patient/Care Giver verbalized understanding and agreement with the above plan.

## 2024-09-03 NOTE — Patient Instructions (Signed)
-   Resume Usual Activities. Notify Physician of any unusual bleeding, erythema or concern for side effects as reviewed above. - Apply ice prn for pain - Tylenol  prn for pain - Follow up with Fidela in 1 month; refilled hydrocodone  today

## 2024-09-28 NOTE — Progress Notes (Signed)
 Subjective:    Patient ID: Monica [name], female    DOB: 07-20-1971, 53 y.o.   MRN: [id number]  HPI: Monica [name] is a 53 y.o. female who returns for follow up appointment for chronic pain and medication refill. She states her pain is located in her neck radiaiting into her left shoulder and left arm with tingling and burning. Also reports lower back pain radiating into her left hip and left lower extremity. She reports increase cervical and lumbar radicular pain, Cervical X-ray ordered today, the above will be  discussed with Dr Emeline, she verbalizes understanding. She rates her pain 9. Her current exercise regime is walking.   In the past she was prescribed gabapentin , she reports it was ineffective and pregabalin  caused her stomach pain.  Monica [name] Morphine equivalent is 15.00 MME.She is also prescribed clonazepam  by Dr. Jerrell Forehand .We have discussed the black box warning of using opioids and benzodiazepines. I highlighted the dangers of using these drugs together and discussed the adverse events including respiratory suppression, overdose, cognitive impairment and importance of compliance with current regimen. We will continue to monitor and adjust as indicated.  she is being closely monitored and under the care of her psychiatrist.  Oral swab was performed today.      Pain Inventory Average Pain 7 Pain Right Now 9 My pain is sharp and aching  In the last 24 hours, has pain interfered with the following? General activity 4 Relation with others 3 Enjoyment of life 2 What TIME of day is your pain at its worst? morning , daytime, evening, and night Sleep (in general) Fair  Pain is worse with: walking, bending, sitting, and standing Pain improves with: medication and TENS Relief from Meds: 10  Family History  Problem Relation Age of Onset   Hypertension Father    Hypertension Sister    Hypertension Maternal Grandmother    Breast cancer Maternal  Aunt 14   Breast cancer Paternal Aunt        dx at unknown age   Breast cancer Other        MGM's niece; dx 28s   Breast cancer Other        MGF's niece   Social History   Socioeconomic History   Marital status: Single    Spouse name: Not on file   Number of children: Not on file   Years of education: Not on file   Highest education level: Not on file  Occupational History   Not on file  Tobacco Use   Smoking status: Former    Current packs/day: 0.00    Average packs/day: 0.5 packs/day for 35.0 years (17.5 ttl pk-yrs)    Types: Cigarettes    Start date: 08/22/1985    Quit date: 08/22/2020    Years since quitting: 4.1   Smokeless tobacco: Never  Vaping Use   Vaping status: Never Used  Substance and Sexual Activity   Alcohol use: No   Drug use: Never   Sexual activity: Not Currently    Birth control/protection: Surgical  Other Topics Concern   Not on file  Social History Narrative   Not on file   Social Drivers of Health   Financial Resource Strain: Low Risk  (05/23/2024)   Overall Financial Resource Strain (CARDIA)    Difficulty of Paying Living Expenses: Not hard at all  Food Insecurity: No Food Insecurity (05/23/2024)   Hunger Vital Sign    Worried About Programme Researcher, Broadcasting/film/video in  the Last Year: Never true    Ran Out of Food in the Last Year: Never true  Transportation Needs: No Transportation Needs (05/23/2024)   PRAPARE - Administrator, Civil Service (Medical): No    Lack of Transportation (Non-Medical): No  Physical Activity: Inactive (05/23/2024)   Exercise Vital Sign    Days of Exercise per Week: 0 days    Minutes of Exercise per Session: 0 min  Stress: Stress Concern Present (05/23/2024)   [employer] of Occupational Health - Occupational Stress Questionnaire    Feeling of Stress: Rather much  Social Connections: Moderately Isolated (05/23/2024)   Social Connection and Isolation Panel    Frequency of Communication with Friends and Family: More  than three times a week    Frequency of Social Gatherings with Friends and Family: Twice a week    Attends Religious Services: 1 to 4 times per year    Active Member of [employer] or Organizations: No    Attends Engineer, Structural: Never    Marital Status: Never married   Past Surgical History:  Procedure Laterality Date   ABDOMINAL HYSTERECTOMY     APPENDECTOMY     BREAST LUMPECTOMY WITH RADIOACTIVE SEED AND SENTINEL LYMPH NODE BIOPSY Right 09/10/2020   Procedure: RIGHT BREAST LUMPECTOMY WITH RADIOACTIVE SEED AND SENTINEL LYMPH NODE MAPPING;  Surgeon: Vanderbilt Ned, MD;  Location: [facility];  Service: General;  Laterality: Right;   COLONOSCOPY WITH PROPOFOL  N/A 10/21/2023   Procedure: COLONOSCOPY WITH PROPOFOL ;  Surgeon: Cindie Carlin POUR, DO;  [facility];  Service: Endoscopy;  Laterality: N/A;  11:00am, asa 3   POLYPECTOMY  10/21/2023   Procedure: POLYPECTOMY INTESTINAL;  Surgeon: Cindie Carlin POUR, DO;  [facility]: [facility];  Service: Endoscopy;;   PORTACATH PLACEMENT Right 10/16/2020   Procedure: INSERTION PORT-A-CATH WITH ULTRASOUND GUIDANCE;  Surgeon: Vanderbilt Ned, MD;  Location: [facility];  Service: General;  Laterality: Right;   RE-EXCISION OF BREAST LUMPECTOMY Right 10/16/2020   Procedure: RE-EXCISION OF RIGHT BREAST LUMPECTOMY;  Surgeon: Vanderbilt Ned, MD;  Location: [facility];  Service: General;  Laterality: Right;   REVERSE SHOULDER ARTHROPLASTY Left 11/16/2022   Procedure: LEFT REVERSE SHOULDER ARTHROPLASTY;  Surgeon: Genelle Standing, MD;  Location: [facility];  Service: Orthopedics;  Laterality: Left;   Past Surgical History:  Procedure Laterality Date   ABDOMINAL HYSTERECTOMY     APPENDECTOMY     BREAST LUMPECTOMY WITH RADIOACTIVE SEED AND SENTINEL LYMPH NODE BIOPSY Right 09/10/2020   Procedure: RIGHT BREAST LUMPECTOMY WITH RADIOACTIVE SEED AND SENTINEL LYMPH NODE MAPPING;  Surgeon:  Vanderbilt Ned, MD;  Location: [facility];  Service: General;  Laterality: Right;   COLONOSCOPY WITH PROPOFOL  N/A 10/21/2023   Procedure: COLONOSCOPY WITH PROPOFOL ;  Surgeon: Cindie Carlin POUR, DO;  [facility];  Service: Endoscopy;  Laterality: N/A;  11:00am, asa 3   POLYPECTOMY  10/21/2023   Procedure: POLYPECTOMY INTESTINAL;  Surgeon: Cindie Carlin POUR, DO;  [facility];  Service: Endoscopy;;   PORTACATH PLACEMENT Right 10/16/2020   Procedure: INSERTION PORT-A-CATH WITH ULTRASOUND GUIDANCE;  Surgeon: Vanderbilt Ned, MD;  Location: [facility];  Service: General;  Laterality: Right;   RE-EXCISION OF BREAST LUMPECTOMY Right 10/16/2020   Procedure: RE-EXCISION OF RIGHT BREAST LUMPECTOMY;  Surgeon: Vanderbilt Ned, MD;  [facility];  Service: General;  Laterality: Right;   REVERSE SHOULDER ARTHROPLASTY Left 11/16/2022   Procedure: LEFT  REVERSE SHOULDER ARTHROPLASTY;  Surgeon: Genelle Standing, MD;  Location: [facility];  Service: Orthopedics;  Laterality: Left;   Past Medical History:  Diagnosis Date   Arthritis    Asthma    Back pain    Breast cancer (HCC)    Buttock pain    Dyspnea    on exertion   Family history of breast cancer 09/22/2020   Hypertension    Multiple lung nodules on CT 2022   Personal history of chemotherapy    Personal history of radiation therapy    There were no vitals taken for this visit.  Opioid Risk Score:   Fall Risk Score:  `1  Depression screen [facility] 2/9     08/16/2024   10:05 AM 06/29/2024    9:02 AM 06/11/2024    9:10 AM 05/23/2024    9:08 AM 02/08/2024    9:34 AM 09/30/2023   10:14 AM 09/30/2023   10:05 AM  Depression screen PHQ 2/9  Decreased Interest 2 1 2 2 1  0 0  Down, Depressed, Hopeless 1 1 2 2 1  0 0  PHQ - 2 Score 3 2 4 4 2  0 0  Altered sleeping 1  2 2   0 0  Tired, decreased energy 2  3 3   0 0  Change in appetite 0  0 0  0 0  Feeling bad or  failure about yourself  0  0 0  0 0  Trouble concentrating 1  0 0  0 0  Moving slowly or fidgety/restless 1  0 0  0 0  Suicidal thoughts 0  0 0  0 0  PHQ-9 Score 8  9 9   0 0  Difficult doing work/chores Not difficult at all  Somewhat difficult   Not difficult at all Not difficult at all    Review of Systems  Musculoskeletal:  Positive for neck pain.       Left shoulder, hip, foot pain  All other systems reviewed and are negative.      Objective:   Physical Exam Vitals and nursing note reviewed.  Constitutional:      Appearance: Normal appearance.  Neck:     Comments: Cervical Paraspinal Tenderness: C-5-C-6 Cardiovascular:     Rate and Rhythm: Normal rate and regular rhythm.     Pulses: Normal pulses.     Heart sounds: Normal heart sounds.  Pulmonary:     Effort: Pulmonary effort is normal.     Breath sounds: Normal breath sounds.  Musculoskeletal:     Comments: Normal Muscle Bulk and Muscle Testing Reveals:  Upper Extremities: Right: Full ROM and Muscle Strength 5/5 Left Upper Extremity: Decreased ROM 90 Degrees and Muscle Strength 5/6 Left AC Joint Tenderness  Thoracic Paraspinal Tenderness: T-2-T-4 Lumbar Paraspinal Tenderness: L-3-L-5  Mainly Left Side  Spinal Flexion: Decreased ROM 30 Degrees  Spinal Extension: Decreased ROM: 20 Degrees  Left Greater Trochanter Tenderness Lower Extremities: Right: Full ROM and Muscle Strength 5/5 Left Lower Extremity: Decreased ROM and Muscle Strength 5/5 Left Lower Extremity Flexion Produces Pain into her left lower extremity and left foot Arises from Table slowly Antalgic  Gait     Skin:    General: Skin is warm and dry.  Neurological:     Mental Status: She is alert and oriented to person, place, and time.  Psychiatric:        Mood and Affect: Mood normal.        Behavior: Behavior normal.  Assessment & Plan:  Acute Exacerbation of  chronic Lower Back Pain: Lumbar X-ray L July/2025 Reviewed: This will be  dicussed with Dr Emeline: Regarding Lumbar MR 2. Cervicalgia/ Cervical Radiculitis: RX: Cervical X-ray. Will discussed with Dr Emeline regarding Cervical MR. Continue to monitor.  3. Left Lumbar Radiculitis: Gabapentin  was discontinued due to being ineffective, she also reports Pregabalin  caused her stomach pain. Continue HEP as Tolerated. Continue to Monitor. 10/01/2024 4. Left Greater Trochanter Tenderness: S/P  Left  Hip Injection with Dr Emeline.with good relief on 09/03/2024.  Continue HEP as Tolerated. Continue to Monitor. 10/01/2024 5.  Chronic Pain Syndrome: Refilled Hydrocodone  5/325 mg one tablet three times a day as needed for pain.  We will continue the opioid monitoring program, this consists of regular clinic visits, examinations, urine drug screen, pill counts as well as use of [place]  Controlled Substance Reporting system. A 12 month History has been reviewed on the   Controlled Substance Reporting System on 10/01/2024.    F/U in 2 months

## 2024-10-01 ENCOUNTER — Encounter: Payer: Self-pay | Admitting: Registered Nurse

## 2024-10-01 ENCOUNTER — Encounter: Attending: Physical Medicine and Rehabilitation | Admitting: Registered Nurse

## 2024-10-01 ENCOUNTER — Telehealth: Payer: Self-pay | Admitting: Registered Nurse

## 2024-10-01 VITALS — BP 119/79 | HR 87 | Ht 64.0 in | Wt 200.0 lb

## 2024-10-01 DIAGNOSIS — Z5181 Encounter for therapeutic drug level monitoring: Secondary | ICD-10-CM | POA: Insufficient documentation

## 2024-10-01 DIAGNOSIS — M5412 Radiculopathy, cervical region: Secondary | ICD-10-CM | POA: Diagnosis present

## 2024-10-01 DIAGNOSIS — M542 Cervicalgia: Secondary | ICD-10-CM | POA: Insufficient documentation

## 2024-10-01 DIAGNOSIS — G8929 Other chronic pain: Secondary | ICD-10-CM | POA: Diagnosis present

## 2024-10-01 DIAGNOSIS — M545 Low back pain, unspecified: Secondary | ICD-10-CM | POA: Insufficient documentation

## 2024-10-01 DIAGNOSIS — G894 Chronic pain syndrome: Secondary | ICD-10-CM | POA: Insufficient documentation

## 2024-10-01 DIAGNOSIS — Z79891 Long term (current) use of opiate analgesic: Secondary | ICD-10-CM | POA: Diagnosis present

## 2024-10-01 DIAGNOSIS — M5416 Radiculopathy, lumbar region: Secondary | ICD-10-CM | POA: Diagnosis present

## 2024-10-01 MED ORDER — HYDROCODONE-ACETAMINOPHEN 5-325 MG PO TABS: 1.0000 | ORAL_TABLET | Freq: Three times a day (TID) | ORAL | 0 refills | Status: DC | PRN

## 2024-10-01 NOTE — Telephone Encounter (Signed)
 Dr Emeline,  I seen Monica [name] today.  She reports Cervicalgia and cervical radicular pain. I ordered a cervical X-ray, can I order a Cervical MRI? Also reports Lumbar radicular pain, she had a Lumbar Xray in July, I didn't order another Lumbar X-ray, you want me to order another Lumbar X-ray or MRI?  I will await your response.  I'm not sure if her insurance will approve the MR but will try if you are in agreement.

## 2024-10-02 ENCOUNTER — Ambulatory Visit (HOSPITAL_COMMUNITY)
Admission: RE | Admit: 2024-10-02 | Discharge: 2024-10-02 | Disposition: A | Discharge status: Home / Self Care | Source: Ambulatory Visit | Attending: Registered Nurse | Admitting: Registered Nurse

## 2024-10-02 DIAGNOSIS — M542 Cervicalgia: Secondary | ICD-10-CM | POA: Diagnosis present

## 2024-10-02 DIAGNOSIS — M5412 Radiculopathy, cervical region: Secondary | ICD-10-CM | POA: Insufficient documentation
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
## 2024-10-03 ENCOUNTER — Ambulatory Visit
Admission: RE | Admit: 2024-10-03 | Discharge: 2024-10-03 | Disposition: A | Source: Ambulatory Visit | Attending: Hematology and Oncology

## 2024-10-03 DIAGNOSIS — Z853 Personal history of malignant neoplasm of breast: Secondary | ICD-10-CM

## 2024-10-04 ENCOUNTER — Telehealth: Payer: Self-pay | Admitting: Registered Nurse

## 2024-10-04 LAB — DRUG TOX MONITOR 1 W/CONF, ORAL FLD
Amphetamines: NEGATIVE ng/mL (ref <10)
Barbiturates: NEGATIVE ng/mL (ref <10)
Benzodiazepines: NEGATIVE ng/mL (ref <0.50)
Buprenorphine: NEGATIVE ng/mL (ref <0.10)
Cocaine: NEGATIVE ng/mL (ref <5.0)
Codeine: NEGATIVE ng/mL (ref <2.5)
Cotinine: 18.6 ng/mL — ABNORMAL HIGH (ref <5.0)
Dihydrocodeine: 3.6 ng/mL — ABNORMAL HIGH (ref <2.5)
Fentanyl: NEGATIVE ng/mL (ref <0.10)
Heroin Metabolite: NEGATIVE ng/mL (ref <1.0)
Hydrocodone: 53.3 ng/mL — ABNORMAL HIGH (ref <2.5)
Hydromorphone: NEGATIVE ng/mL (ref <2.5)
MARIJUANA: NEGATIVE ng/mL (ref <2.5)
MDMA: NEGATIVE ng/mL (ref <10)
Meprobamate: NEGATIVE ng/mL (ref <2.5)
Methadone: NEGATIVE ng/mL (ref <5.0)
Morphine: NEGATIVE ng/mL (ref <2.5)
Nicotine Metabolite: POSITIVE ng/mL — AB (ref <5.0)
Norhydrocodone: NEGATIVE ng/mL (ref <2.5)
Noroxycodone: NEGATIVE ng/mL (ref <2.5)
Opiates: POSITIVE ng/mL — AB (ref <2.5)
Oxycodone: NEGATIVE ng/mL (ref <2.5)
Oxymorphone: NEGATIVE ng/mL (ref <2.5)
Phencyclidine: NEGATIVE ng/mL (ref <10)
Tapentadol: NEGATIVE ng/mL (ref <5.0)
Tramadol: NEGATIVE ng/mL (ref <5.0)
Zolpidem: NEGATIVE ng/mL (ref <5.0)

## 2024-10-04 LAB — DRUG TOX ALC METAB W/CON, ORAL FLD: Alcohol Metabolite: NEGATIVE ng/mL (ref <25)

## 2024-10-04 NOTE — Telephone Encounter (Signed)
 Dr Emeline recommendations was reviewed.  Call placed to Ms. Bondarenko regarding Dr Emeline recommendations, she will F/U with Dr Eldonna she states.

## 2024-10-08 ENCOUNTER — Encounter: Payer: Self-pay | Admitting: Radiology

## 2024-10-12 ENCOUNTER — Encounter: Payer: Self-pay | Admitting: Family Medicine

## 2024-10-12 ENCOUNTER — Ambulatory Visit (INDEPENDENT_AMBULATORY_CARE_PROVIDER_SITE_OTHER): Admitting: Family Medicine

## 2024-10-12 VITALS — BP 131/83 | HR 91 | Ht 64.0 in | Wt 203.0 lb

## 2024-10-12 DIAGNOSIS — R252 Cramp and spasm: Secondary | ICD-10-CM | POA: Diagnosis not present

## 2024-10-12 DIAGNOSIS — I10 Essential (primary) hypertension: Secondary | ICD-10-CM | POA: Diagnosis not present

## 2024-10-12 DIAGNOSIS — E559 Vitamin D deficiency, unspecified: Secondary | ICD-10-CM

## 2024-10-12 DIAGNOSIS — R7301 Impaired fasting glucose: Secondary | ICD-10-CM

## 2024-10-12 DIAGNOSIS — E038 Other specified hypothyroidism: Secondary | ICD-10-CM

## 2024-10-12 DIAGNOSIS — Z23 Encounter for immunization: Secondary | ICD-10-CM | POA: Diagnosis not present

## 2024-10-12 DIAGNOSIS — G4762 Sleep related leg cramps: Secondary | ICD-10-CM

## 2024-10-12 DIAGNOSIS — E782 Mixed hyperlipidemia: Secondary | ICD-10-CM

## 2024-10-12 MED ORDER — LISINOPRIL 10 MG PO TABS: 10.0000 mg | ORAL_TABLET | Freq: Every day | ORAL | 1 refills | Status: AC

## 2024-10-12 NOTE — Patient Instructions (Addendum)
 I appreciate the opportunity to provide care to you today!    Follow up:  4 months  Labs: please stop by the lab during the week to get your blood drawn (CBC, CMP, TSH, Lipid profile, HgA1c, Vit D, Magnesium)  Leg cramps of the left lower extremity -Stretch before bed - Gentle calf stretches for 30 seconds per leg can reduce nighttime cramps. -Stay hydrated - Drink water regularly throughout the day. -Check electrolytes - Eat foods rich in potassium (bananas, oranges), magnesium (nuts, leafy greens), and calcium (dairy, fortified milk). -Warm bath or massage before bed can relax the muscles. -Proper footwear - Supportive shoes can help if you stand a lot during the day.  Please monitor for:  -You notice swelling, redness, warmth, or pain in your leg (to rule out a blood clot).   Please follow up if your symptoms worsen or fail to improve.    Please continue to a heart-healthy diet and increase your physical activities. Try to exercise for at least five days a week.    It was a pleasure to see you and I look forward to continuing to work together on your health and well-being. Please do not hesitate to call the office if you need care or have questions about your care.  In case of emergency, please visit the Emergency Department for urgent care, or contact our clinic at [phone] to schedule an appointment. We're here to help you!   Have a wonderful day and week. With Gratitude, Meade JENEANE Gerlach MSN, FNP-BC, PMHNP-BC

## 2024-10-12 NOTE — Progress Notes (Signed)
 Established Patient Office Visit  Subjective:  Patient ID: Monica [name], female    DOB: Feb 25, 1971  Age: 53 y.o. MRN: [id number]  CC:  Chief Complaint  Patient presents with   Hypertension    Four month follow up   cramping    Cramping in legs     HPI Monica [name] is a 53 y.o. female with past medical history of HTN, obesity, obesity presents for f/u of  chronic medical conditions.  Nocturnal leg cramps left calf: Patient reports experiencing cramps in the left calf at bedtime, typically occurring at night and occasionally waking her from sleep.The last episode was approximately 3 weeks ago.Denies numbness, weakness, or tingling. No history of recent injury or swelling  Past Medical History:  Diagnosis Date   Arthritis    Asthma    Back pain    Breast cancer (HCC)    Buttock pain    Dyspnea    on exertion   Family history of breast cancer 09/22/2020   Hypertension    Multiple lung nodules on CT 2022   Personal history of chemotherapy    Personal history of radiation therapy     Past Surgical History:  Procedure Laterality Date   ABDOMINAL HYSTERECTOMY     APPENDECTOMY     BREAST LUMPECTOMY WITH RADIOACTIVE SEED AND SENTINEL LYMPH NODE BIOPSY Right 09/10/2020   Procedure: RIGHT BREAST LUMPECTOMY WITH RADIOACTIVE SEED AND SENTINEL LYMPH NODE MAPPING;  Surgeon: Vanderbilt Ned, MD;  Location: [facility];  Service: General;  Laterality: Right;   COLONOSCOPY WITH PROPOFOL  N/A 10/21/2023   Procedure: COLONOSCOPY WITH PROPOFOL ;  Surgeon: Cindie Carlin POUR, DO;  Location: [facility];  Service: Endoscopy;  Laterality: N/A;  11:00am, asa 3   POLYPECTOMY  10/21/2023   Procedure: POLYPECTOMY INTESTINAL;  Surgeon: Cindie Carlin POUR, DO;  Location: [facility];  Service: Endoscopy;;   PORTACATH PLACEMENT Right 10/16/2020   Procedure: INSERTION PORT-A-CATH WITH ULTRASOUND GUIDANCE;  Surgeon: Vanderbilt Ned, MD;  Location: [facility];  Service: General;  Laterality: Right;   RE-EXCISION OF BREAST LUMPECTOMY Right 10/16/2020   Procedure: RE-EXCISION OF RIGHT BREAST LUMPECTOMY;  Surgeon: Vanderbilt Ned, MD;  Location: [facility];  Service: General;  Laterality: Right;   REVERSE SHOULDER ARTHROPLASTY Left 11/16/2022   Procedure: LEFT REVERSE SHOULDER ARTHROPLASTY;  Surgeon: Genelle Standing, MD;  Location: [facility];  Service: Orthopedics;  Laterality: Left;    Family History  Problem Relation Age of Onset   Hypertension Father    Hypertension Sister    Hypertension Maternal Grandmother    Breast cancer Maternal Aunt 49   Breast cancer Paternal Aunt        dx at unknown age   Breast cancer Other        MGM's niece; dx 67s   Breast cancer Other        MGF's niece    Social History   Socioeconomic History   Marital status: Single    Spouse name: Not on file   Number of children: Not on file   Years of education: Not on file   Highest education level: Not on file  Occupational History   Not on file  Tobacco Use   Smoking status: Former    Current packs/day: 0.00    Average packs/day: 0.5 packs/day for 35.0 years (17.5 ttl pk-yrs)    Types: Cigarettes    Start date: 08/22/1985    Quit date: 08/22/2020  Years since quitting: 4.1   Smokeless tobacco: Never  Vaping Use   Vaping status: Never Used  Substance and Sexual Activity   Alcohol use: No   Drug use: Never   Sexual activity: Not Currently    Birth control/protection: Surgical  Other Topics Concern   Not on file  Social History Narrative   Not on file   Social Drivers of Health   Financial Resource Strain: Low Risk  (05/23/2024)   Overall Financial Resource Strain (CARDIA)    Difficulty of Paying Living Expenses: Not hard at all  Food Insecurity: No Food Insecurity (05/23/2024)   Hunger Vital Sign    Worried About Running Out of Food in the Last Year: Never true    Ran Out of Food in the Last Year: Never  true  Transportation Needs: No Transportation Needs (05/23/2024)   PRAPARE - Administrator, Civil Service (Medical): No    Lack of Transportation (Non-Medical): No  Physical Activity: Inactive (05/23/2024)   Exercise Vital Sign    Days of Exercise per Week: 0 days    Minutes of Exercise per Session: 0 min  Stress: Stress Concern Present (05/23/2024)   [employer] of Occupational Health - Occupational Stress Questionnaire    Feeling of Stress: Rather much  Social Connections: Moderately Isolated (05/23/2024)   Social Connection and Isolation Panel    Frequency of Communication with Friends and Family: More than three times a week    Frequency of Social Gatherings with Friends and Family: Twice a week    Attends Religious Services: 1 to 4 times per year    Active Member of [employer] or Organizations: No    Attends Banker Meetings: Never    Marital Status: Never married  Intimate Partner Violence: Not At Risk (05/23/2024)   Humiliation, Afraid, Rape, and Kick questionnaire    Fear of Current or Ex-Partner: No    Emotionally Abused: No    Physically Abused: No    Sexually Abused: No    Outpatient Medications Prior to Visit  Medication Sig Dispense Refill   albuterol  (PROVENTIL ) (2.5 MG/3ML) 0.083% nebulizer solution Take 3 mLs (2.5 mg total) by nebulization every 4 (four) hours as needed for wheezing or shortness of breath. 75 mL 2   alendronate  (FOSAMAX ) 70 MG tablet Take 1 tablet (70 mg total) by mouth once a week. Take with a full glass of water on an empty stomach. 12 tablet 3   azithromycin  (ZITHROMAX  Z-PAK) 250 MG tablet Take 2 tablets (500 mg) on  Day 1,  followed by 1 tablet (250 mg) once daily on Days 2 through 5. 6 each 0   budesonide -formoterol  (SYMBICORT ) 160-4.5 MCG/ACT inhaler Inhale 2 puffs into the lungs in the morning and at bedtime. 3 each 3   calcium citrate (CALCITRATE - DOSED IN MG ELEMENTAL CALCIUM) 950 (200 Ca) MG tablet Take 200 mg of  elemental calcium by mouth daily.     clonazePAM (KLONOPIN) 0.5 MG tablet Take 0.5 mg by mouth daily as needed.     escitalopram (LEXAPRO) 20 MG tablet Take by mouth.     HYDROcodone -acetaminophen  (NORCO) 5-325 MG tablet Take 1 tablet by mouth 3 (three) times daily as needed for moderate pain (pain score 4-6). 90 tablet 0   letrozole  (FEMARA ) 2.5 MG tablet Take 1 tablet (2.5 mg total) by mouth daily. 90 tablet 3   methocarbamol  (ROBAXIN ) 500 MG tablet Take 1 tablet (500 mg total) by mouth 2 (two) times daily  as needed for muscle spasms. 28 tablet 0   prazosin (MINIPRESS) 1 MG capsule take 1 capsule by mouth daily; for blood pressure 90 capsule 1   predniSONE  (DELTASONE ) 20 MG tablet 3 po qd x 3 d then 2 po qd x 3 d then 1 po qd x 2 d 17 tablet 0   temazepam (RESTORIL) 15 MG capsule Take 15 mg by mouth at bedtime as needed for sleep.     lisinopril  (ZESTRIL ) 10 MG tablet Take 1 tablet (10 mg total) by mouth daily. 90 tablet 1   No facility-administered medications prior to visit.    Allergies  Allergen Reactions   Oxycodone  Itching and Swelling    ROS Review of Systems  Constitutional:  Negative for chills and fever.  Eyes:  Negative for visual disturbance.  Respiratory:  Negative for chest tightness and shortness of breath.   Neurological:  Negative for dizziness and headaches.      Objective:    Physical Exam HENT:     Head: Normocephalic.     Mouth/Throat:     Mouth: Mucous membranes are moist.  Cardiovascular:     Rate and Rhythm: Normal rate.     Heart sounds: Normal heart sounds.  Pulmonary:     Effort: Pulmonary effort is normal.     Breath sounds: Normal breath sounds.  Neurological:     Mental Status: She is alert.     BP 131/83   Pulse 91   Ht 5' 4 (1.626 m)   Wt 203 lb (92.1 kg)   SpO2 95%   BMI 34.84 kg/m  Wt Readings from Last 3 Encounters:  10/12/24 203 lb (92.1 kg)  10/01/24 200 lb (90.7 kg)  09/03/24 199 lb (90.3 kg)    Lab Results   Component Value Date   TSH 2.590 06/13/2024   Lab Results  Component Value Date   WBC 8.6 06/13/2024   HGB 13.9 06/13/2024   HCT 42.6 06/13/2024   MCV 86 06/13/2024   PLT 250 06/13/2024   Lab Results  Component Value Date   NA 139 06/13/2024   K 4.2 06/13/2024   CO2 21 06/13/2024   GLUCOSE 79 06/13/2024   BUN 8 06/13/2024   CREATININE 0.87 06/13/2024   BILITOT 0.4 06/13/2024   ALKPHOS 145 (H) 06/13/2024   AST 28 06/13/2024   ALT 24 06/13/2024   PROT 6.9 06/13/2024   ALBUMIN 4.2 06/13/2024   CALCIUM 9.3 06/13/2024   ANIONGAP 9 07/31/2022   EGFR 80 06/13/2024   Lab Results  Component Value Date   CHOL 162 06/13/2024   Lab Results  Component Value Date   HDL 57 06/13/2024   Lab Results  Component Value Date   LDLCALC 91 06/13/2024   Lab Results  Component Value Date   TRIG 71 06/13/2024   Lab Results  Component Value Date   CHOLHDL 2.8 06/13/2024   Lab Results  Component Value Date   HGBA1C 5.3 06/13/2024      Assessment & Plan:  Nocturnal leg cramps Assessment & Plan: Likely related to muscle fatigue, dehydration, or electrolyte imbalance. Encourage adequate hydration throughout the day. Recommend gentle stretching of the calves before bedtime and after prolonged sitting or standing. Consider magnesium supplementation or review electrolyte levels if symptoms persist. Encourage regular physical activity and maintaining a healthy weight. May use warm compresses or gentle massage during cramping episodes. Follow up if symptoms increase in frequency, severity, or are accompanied by swelling, redness, or neurological symptoms.  Patient verbalized understanding.'   Primary hypertension Assessment & Plan: Controlled Encouraged to continue taking lisinopril  10 mg daily as prescribed A low-sodium diet of less than 2,300 mg daily is recommended, along with moderate-intensity physical activity for at least 150 minutes per week. The patient is encouraged to  maintain these lifestyle modifications to help manage her blood pressure effectively.  Long-term considerations were discussed, emphasizing that uncontrolled hypertension increases the risk of cardiovascular diseases, including stroke, coronary artery disease, and heart failure.  The patient is encouraged to seek emergency care if blood pressure exceeds 180/120 and is accompanied by symptoms such as headaches, chest pain, palpitations, blurred vision, or dizziness. She verbalized understanding and will follow up as scheduled.  BP Readings from Last 3 Encounters:  10/12/24 131/83  10/01/24 119/79  09/03/24 106/72     Orders: -     Lisinopril ; Take 1 tablet (10 mg total) by mouth daily.  Dispense: 90 tablet; Refill: 1  Encounter for immunization Assessment & Plan: Patient educated on CDC recommendation for the vaccine. Verbal consent was obtained from the patient, vaccine administered by nurse, no sign of adverse reactions noted at this time. Patient education on arm soreness and use of tylenol  or ibuprofen  for this patient  was discussed. Patient educated on the signs and symptoms of adverse effect and advise to contact the office if they occur.   Orders: -     Flu vaccine trivalent PF, 6mos and older(Flulaval,Afluria,Fluarix,Fluzone)  Cramps of left lower extremity -     Magnesium  IFG (impaired fasting glucose) -     Hemoglobin A1c  Vitamin D  deficiency -     VITAMIN D  25 Hydroxy (Vit-D Deficiency, Fractures)  TSH (thyroid-stimulating hormone deficiency) -     TSH + free T4  Mixed hyperlipidemia -     Lipid panel -     CMP14+EGFR -     CBC with Differential/Platelet  Note: This chart has been completed using Engineer, Civil (consulting) software, and while attempts have been made to ensure accuracy, certain words and phrases may not be transcribed as intended.    Follow-up: Return in about 4 months (around 02/09/2025).   Mena Simonis  Z Bacchus, FNP

## 2024-10-15 DIAGNOSIS — G4762 Sleep related leg cramps: Secondary | ICD-10-CM | POA: Insufficient documentation

## 2024-10-15 DIAGNOSIS — Z23 Encounter for immunization: Secondary | ICD-10-CM | POA: Insufficient documentation

## 2024-10-15 NOTE — Assessment & Plan Note (Signed)
 Controlled Encouraged to continue taking lisinopril  10 mg daily as prescribed A low-sodium diet of less than 2,300 mg daily is recommended, along with moderate-intensity physical activity for at least 150 minutes per week. The patient is encouraged to maintain these lifestyle modifications to help manage her blood pressure effectively.  Long-term considerations were discussed, emphasizing that uncontrolled hypertension increases the risk of cardiovascular diseases, including stroke, coronary artery disease, and heart failure.  The patient is encouraged to seek emergency care if blood pressure exceeds 180/120 and is accompanied by symptoms such as headaches, chest pain, palpitations, blurred vision, or dizziness. She verbalized understanding and will follow up as scheduled.  BP Readings from Last 3 Encounters:  10/12/24 131/83  10/01/24 119/79  09/03/24 106/72

## 2024-10-15 NOTE — Assessment & Plan Note (Signed)
 Patient educated on CDC recommendation for the vaccine. Verbal consent was obtained from the patient, vaccine administered by nurse, no sign of adverse reactions noted at this time. Patient education on arm soreness and use of tylenol or ibuprofen for this patient  was discussed. Patient educated on the signs and symptoms of adverse effect and advise to contact the office if they occur.

## 2024-10-15 NOTE — Assessment & Plan Note (Signed)
 Likely related to muscle fatigue, dehydration, or electrolyte imbalance. Encourage adequate hydration throughout the day. Recommend gentle stretching of the calves before bedtime and after prolonged sitting or standing. Consider magnesium supplementation or review electrolyte levels if symptoms persist. Encourage regular physical activity and maintaining a healthy weight. May use warm compresses or gentle massage during cramping episodes. Follow up if symptoms increase in frequency, severity, or are accompanied by swelling, redness, or neurological symptoms. Patient verbalized understanding.'

## 2024-10-16 LAB — TSH+FREE T4
Free T4: 1.05 ng/dL (ref 0.82–1.77)
TSH: 2.15 u[IU]/mL (ref 0.450–4.500)

## 2024-10-16 LAB — CMP14+EGFR
ALT: 18 IU/L (ref 0–32)
AST: 24 IU/L (ref 0–40)
Albumin: 4 g/dL (ref 3.8–4.9)
Alkaline Phosphatase: 108 IU/L (ref 49–135)
BUN/Creatinine Ratio: 5 — ABNORMAL LOW (ref 9–23)
BUN: 4 mg/dL — ABNORMAL LOW (ref 6–24)
Bilirubin Total: 0.4 mg/dL (ref 0.0–1.2)
CO2: 22 mmol/L (ref 20–29)
Calcium: 8.9 mg/dL (ref 8.7–10.2)
Chloride: 104 mmol/L (ref 96–106)
Creatinine, Ser: 0.85 mg/dL (ref 0.57–1.00)
Globulin, Total: 2.7 g/dL (ref 1.5–4.5)
Glucose: 89 mg/dL (ref 70–99)
Potassium: 4 mmol/L (ref 3.5–5.2)
Sodium: 138 mmol/L (ref 134–144)
Total Protein: 6.7 g/dL (ref 6.0–8.5)
eGFR: 81 mL/min/1.73 (ref >59)

## 2024-10-16 LAB — CBC WITH DIFFERENTIAL/PLATELET
Basophils Absolute: 0 x10E3/uL (ref 0.0–0.2)
Basos: 0 %
EOS (ABSOLUTE): 0.1 x10E3/uL (ref 0.0–0.4)
Eos: 2 %
Hematocrit: 41.8 % (ref 34.0–46.6)
Hemoglobin: 13.6 g/dL (ref 11.1–15.9)
Immature Grans (Abs): 0 x10E3/uL (ref 0.0–0.1)
Immature Granulocytes: 0 %
Lymphocytes Absolute: 2.2 x10E3/uL (ref 0.7–3.1)
Lymphs: 29 %
MCH: 28 pg (ref 26.6–33.0)
MCHC: 32.5 g/dL (ref 31.5–35.7)
MCV: 86 fL (ref 79–97)
Monocytes Absolute: 0.7 x10E3/uL (ref 0.1–0.9)
Monocytes: 9 %
Neutrophils Absolute: 4.4 x10E3/uL (ref 1.4–7.0)
Neutrophils: 60 %
Platelets: 257 x10E3/uL (ref 150–450)
RBC: 4.85 x10E6/uL (ref 3.77–5.28)
RDW: 14.7 % (ref 11.7–15.4)
WBC: 7.3 x10E3/uL (ref 3.4–10.8)

## 2024-10-16 LAB — VITAMIN D 25 HYDROXY (VIT D DEFICIENCY, FRACTURES): Vit D, 25-Hydroxy: 37.7 ng/mL (ref 30.0–100.0)

## 2024-10-16 LAB — HEMOGLOBIN A1C
Est. average glucose Bld gHb Est-mCnc: 111 mg/dL
Hgb A1c MFr Bld: 5.5 % (ref 4.8–5.6)

## 2024-10-16 LAB — LIPID PANEL
Chol/HDL Ratio: 3.4 ratio (ref 0.0–4.4)
Cholesterol, Total: 162 mg/dL (ref 100–199)
HDL: 48 mg/dL (ref >39)
LDL Chol Calc (NIH): 103 mg/dL — ABNORMAL HIGH (ref 0–99)
Triglycerides: 53 mg/dL (ref 0–149)
VLDL Cholesterol Cal: 11 mg/dL (ref 5–40)

## 2024-10-16 LAB — MAGNESIUM: Magnesium: 2 mg/dL (ref 1.6–2.3)

## 2024-10-22 ENCOUNTER — Telehealth: Payer: Self-pay

## 2024-10-22 ENCOUNTER — Ambulatory Visit: Payer: Self-pay | Admitting: Family Medicine

## 2024-10-22 NOTE — Progress Notes (Signed)
 Please inform the patient that her cholesterol levels are elevated. I recommend decreasing her intake of greasy, fatty, and starchy foods and increasing her physical activity. All other laboratory results are stable.

## 2024-10-22 NOTE — Telephone Encounter (Signed)
 Copied from CRM [id number]. Topic: Clinical - Lab/Test Results >> Oct 22, 2024  8:30 AM Monica [name] wrote: Reason for CRM: Patient would like a call back regarding her last Monday results please give her a call back there are a few numbers she don't understand

## 2024-10-31 ENCOUNTER — Telehealth: Payer: Self-pay | Admitting: Registered Nurse

## 2024-10-31 NOTE — Telephone Encounter (Signed)
 Pt is returning your call. She said she is available now if you want to call back

## 2024-10-31 NOTE — Telephone Encounter (Signed)
 Ms. Monica [name]: Dr Eldonna following.

## 2024-11-06 ENCOUNTER — Telehealth: Payer: Self-pay | Admitting: Registered Nurse

## 2024-11-06 NOTE — Telephone Encounter (Signed)
 Return Monica [name] call, she reports she will be following up with Dr Emeline regarding her Cervical Pain.

## 2024-11-12 ENCOUNTER — Telehealth: Payer: Self-pay | Admitting: Physical Medicine and Rehabilitation

## 2024-11-12 ENCOUNTER — Other Ambulatory Visit: Payer: Self-pay | Admitting: Physical Medicine and Rehabilitation

## 2024-11-12 DIAGNOSIS — M5412 Radiculopathy, cervical region: Secondary | ICD-10-CM

## 2024-11-12 MED ORDER — DIAZEPAM 5 MG PO TABS: ORAL_TABLET | ORAL | 0 refills | Status: AC

## 2024-11-12 NOTE — Telephone Encounter (Signed)
 Pt called wanting to make an appt. She has a refferal from Dr. Raylene. She says the x-rays are on my chart. Call back number is [phone]

## 2024-11-26 ENCOUNTER — Inpatient Hospital Stay: Payer: Medicare Other | Attending: Hematology and Oncology | Admitting: Hematology and Oncology

## 2024-11-26 VITALS — BP 118/78 | HR 81 | Temp 97.6°F | Resp 18 | Ht 64.0 in | Wt 198.8 lb

## 2024-11-26 DIAGNOSIS — Z170 Estrogen receptor positive status [ER+]: Secondary | ICD-10-CM | POA: Diagnosis not present

## 2024-11-26 DIAGNOSIS — Z9221 Personal history of antineoplastic chemotherapy: Secondary | ICD-10-CM | POA: Insufficient documentation

## 2024-11-26 DIAGNOSIS — Z1731 Human epidermal growth factor receptor 2 positive status: Secondary | ICD-10-CM | POA: Insufficient documentation

## 2024-11-26 DIAGNOSIS — Z1721 Progesterone receptor positive status: Secondary | ICD-10-CM | POA: Diagnosis not present

## 2024-11-26 DIAGNOSIS — Z923 Personal history of irradiation: Secondary | ICD-10-CM | POA: Diagnosis not present

## 2024-11-26 DIAGNOSIS — Z79811 Long term (current) use of aromatase inhibitors: Secondary | ICD-10-CM | POA: Diagnosis not present

## 2024-11-26 DIAGNOSIS — C50411 Malignant neoplasm of upper-outer quadrant of right female breast: Secondary | ICD-10-CM | POA: Diagnosis not present

## 2024-11-26 MED ORDER — LETROZOLE 2.5 MG PO TABS: 2.5000 mg | ORAL_TABLET | Freq: Every day | ORAL | 3 refills | Status: AC

## 2024-11-26 NOTE — Progress Notes (Signed)
 "  Patient Care Team: Bacchus, Meade PEDLAR, FNP as PCP - General (Family Medicine) Odean Potts, MD as Consulting Physician (Hematology and Oncology) Izell Domino, MD as Attending Physician (Radiation Oncology) [facility] as Therapist, Sports (Professional Counselor) Cindie, Carlin POUR, DO as Consulting Physician (Gastroenterology) Genelle Standing, MD as Consulting Physician (Orthopedic Surgery) Mannam, Praveen, MD as Consulting Physician (Pulmonary Disease) Emeline Joesph BROCKS, DO as Consulting Physician (Physical Medicine and Rehabilitation) Vicci Mcardle, OD (Optometry) [facility] as Consulting Physician (Optometry) Eldonna Novel, MD as Consulting Physician (Physical Medicine and Rehabilitation) Odean Potts, MD as Consulting Physician (Hematology and Oncology)  DIAGNOSIS:  Encounter Diagnosis  Name Primary?   Primary malignant neoplasm of upper outer quadrant of female breast, right (HCC) Yes    SUMMARY OF ONCOLOGIC HISTORY: Oncology History  Primary malignant neoplasm of upper outer quadrant of female breast, right (HCC)  08/12/2020 Initial Diagnosis   Screening mammogram on 07/07/20 showed an asymmetry. Diagnostic mammogram and US  on 07/29/20 showed a 0.8cm mass at the 10 o'clock position and no right axillary adenopathy. Biopsy on 08/12/20 showed invasive and in situ mammary carcinoma, grade 2, HER-2 equivocal by IHC, positive by FISH, ER+ 80%, PR+ 60%, Ki67 15%.   08/26/2020 Cancer Staging   Staging form: Breast, AJCC 8th Edition - Clinical stage from 08/26/2020: Stage IA (cT1b, cN0, cM0, G2, ER+, PR+, HER2+) - Signed by Izell Domino, MD on 08/27/2020   09/10/2020 Surgery   Right lumpectomy ([facility]): invasive lobular carcinoma, 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   11/03/2020 -  10/20/2021 Chemotherapy   Patient is on Treatment Plan : BREAST Paclitaxel  + Trastuzumab  q7d / Trastuzumab  q21d      Genetic Testing   Negative genetic testing: no pathogenic variants detected in [facility] Multi-Cancer Panel.  Variants of uncertain significance detected in ATM (c.131A>G (p.Asp44Gly) and c.4658A>C (p.Glu1553Ala)) and WRN (c.2825+6G>T (intronic)).  The report date is October 02, 2020.   The Multi-Cancer Panel offered by [facility] includes sequencing and/or deletion duplication testing of the following 85 genes: AIP, ALK, APC, ATM, AXIN2,BAP1,  BARD1, BLM, BMPR1A, BRCA1, BRCA2, BRIP1, CASR, CDC73, CDH1, CDK4, CDKN1B, CDKN1C, CDKN2A (p14ARF), CDKN2A (p16INK4a), CEBPA, CHEK2, CTNNA1, DICER1, DIS3L2, EGFR (c.2369C>T, p.Thr790Met variant only), EPCAM (Deletion/duplication testing only), FH, FLCN, GATA2, GPC3, GREM1 (Promoter region deletion/duplication testing only), HOXB13 (c.251G>A, p.Gly84Glu), HRAS, KIT, MAX, MEN1, MET, MITF (c.952G>A, p.Glu318Lys variant only), MLH1, MSH2, MSH3, MSH6, MUTYH, NBN, NF1, NF2, NTHL1, PALB2, PDGFRA, PHOX2B, PMS2, POLD1, POLE, POT1, PRKAR1A, PTCH1, PTEN, RAD50, RAD51C, RAD51D, RB1, RECQL4, RET, RNF43, RUNX1, SDHAF2, SDHA (sequence changes only), SDHB, SDHC, SDHD, SMAD4, SMARCA4, SMARCB1, SMARCE1, STK11, SUFU, TERC, TERT, TMEM127, TP53, TSC1, TSC2, VHL, WRN and WT1.   Amended report: The variant of uncertain significance (VUS) in WRN at  c.2825+6G>T (Intronic) has been reclassified to likely benign.  The change in variant classification was made as a result of re-review of evidence in light of new variant interpretation guidelines and/or new information. The amended report date is December 18, 2020.    02/10/2021 - 03/24/2021 Radiation Therapy   Site Technique Total Dose (Gy) Dose per Fx (Gy) Completed Fx Beam Energies  Breast, Right: Breast_Rt 3D 50/50 2 25/25 10X  Breast, Right: Breast_Rt_PAB_SCV 3D 50/50 2 25/25 6X, 15X  Breast, Right: Breast_Rt_Bst 3D 10/10 2 5/5  6X, 10X     04/13/2021 -  Anti-estrogen oral therapy  Initially anastrozole  switched to letrozole  for fatigue issues     CHIEF COMPLIANT: Surveillance of breast cancer on letrozole  therapy  HISTORY OF PRESENT ILLNESS:   History of Present Illness Monica [name] is a 53 year old female with stage IA right breast invasive lobular carcinoma, status post lumpectomy, adjuvant chemotherapy, radiation, and ongoing endocrine therapy, who presents for routine oncology follow-up and management of chronic post-surgical breast pain and osteopenia.  She is four years into a planned seven-year course of letrozole  and tolerates it with manageable hot flashes and hair shedding. She is adherent and has adequate refills.  She has intermittent sharp, stabbing pain and soreness at the right breast surgical site and scar, sometimes severe and worsened by palpation or mammography. She uses hydrocodone  intermittently, particularly before physical therapy or mammograms, and is interested in trying topical options such as CBD oil or Voltaren gel for additional relief.  She takes weekly bone-strengthening medication with adequate refills and performs daily upper and lower extremity exercises to support bone density. She was told her forearm bone density is lower than her hip and spine, which were described as being in decent condition.      ALLERGIES:  is allergic to oxycodone .  MEDICATIONS:  Current Outpatient Medications  Medication Sig Dispense Refill   albuterol  (PROVENTIL ) (2.5 MG/3ML) 0.083% nebulizer solution Take 3 mLs (2.5 mg total) by nebulization every 4 (four) hours as needed for wheezing or shortness of breath. 75 mL 2   alendronate  (FOSAMAX ) 70 MG tablet Take 1 tablet (70 mg total) by mouth once a week. Take with a full glass of water on an empty stomach. 12 tablet 3   budesonide -formoterol  (SYMBICORT ) 160-4.5 MCG/ACT inhaler Inhale 2 puffs into the lungs in the morning and at bedtime. 3  each 3   calcium citrate (CALCITRATE - DOSED IN MG ELEMENTAL CALCIUM) 950 (200 Ca) MG tablet Take 200 mg of elemental calcium by mouth daily.     clonazePAM (KLONOPIN) 0.5 MG tablet Take 0.5 mg by mouth daily as needed.     diazepam  (VALIUM ) 5 MG tablet Take one tablet by mouth with light food one hour prior to procedure. 1 tablet 0   escitalopram (LEXAPRO) 20 MG tablet Take by mouth.     HYDROcodone -acetaminophen  (NORCO) 5-325 MG tablet Take 1 tablet by mouth 3 (three) times daily as needed for moderate pain (pain score 4-6). 90 tablet 0   lisinopril  (ZESTRIL ) 10 MG tablet Take 1 tablet (10 mg total) by mouth daily. 90 tablet 1   methocarbamol  (ROBAXIN ) 500 MG tablet Take 1 tablet (500 mg total) by mouth 2 (two) times daily as needed for muscle spasms. 28 tablet 0   prazosin (MINIPRESS) 1 MG capsule take 1 capsule by mouth daily; for blood pressure 90 capsule 1   temazepam (RESTORIL) 15 MG capsule Take 15 mg by mouth at bedtime as needed for sleep.     letrozole  (FEMARA ) 2.5 MG tablet Take 1 tablet (2.5 mg total) by mouth daily. 90 tablet 3   No current facility-administered medications for this visit.    PHYSICAL EXAMINATION: ECOG PERFORMANCE STATUS: 1 - Symptomatic but completely ambulatory  Vitals:   11/26/24 0900  BP: 118/78  Pulse: 81  Resp: 18  Temp: 97.6 F (36.4 C)  SpO2: 100%   Filed Weights   11/26/24 0900  Weight: 198 lb 12.8 oz (90.2 kg)    Physical Exam Breast exam: Severe tenderness in the left breast especially in the upper outer quadrant.     (  exam performed in the presence of a chaperone)  LABORATORY DATA:  I have reviewed the data as listed    Latest Ref Rng & Units 10/15/2024    8:20 AM 06/13/2024    8:34 AM 08/31/2023   11:12 AM  CMP  Glucose 70 - 99 mg/dL 89  79  87   BUN 6 - 24 mg/dL 4  8  7    Creatinine 0.57 - 1.00 mg/dL 9.14  9.12  9.14   Sodium 134 - 144 mmol/L 138  139  142   Potassium 3.5 - 5.2 mmol/L 4.0  4.2  4.1   Chloride 96 - 106  mmol/L 104  104  106   CO2 20 - 29 mmol/L 22  21  22    Calcium 8.7 - 10.2 mg/dL 8.9  9.3  9.8   Total Protein 6.0 - 8.5 g/dL 6.7  6.9  7.0   Total Bilirubin 0.0 - 1.2 mg/dL 0.4  0.4  0.5   Alkaline Phos 49 - 135 IU/L 108  145  141   AST 0 - 40 IU/L 24  28  33   ALT 0 - 32 IU/L 18  24  25      Lab Results  Component Value Date   WBC 7.3 10/15/2024   HGB 13.6 10/15/2024   HCT 41.8 10/15/2024   MCV 86 10/15/2024   PLT 257 10/15/2024   NEUTROABS 4.4 10/15/2024    ASSESSMENT & PLAN:  Primary malignant neoplasm of upper outer quadrant of female breast, right (HCC) 09/10/2020:Right lumpectomy ([facility]): invasive lobular carcinoma, 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  ER 80%, PR 60%, Ki-67 15%, HER-2 equivocal by IHC positive for fish 10/11/20: Re-excision: Neg   Recommendation: 1. Adjuvant chemotherapy with Taxol  Herceptin  followed by Herceptin  maintenance for 1 year completed November 2022 2. Adjuvant radiation therapy 02/10/21- 03/24/21 3. Adjuvant antiestrogen therapy with anastrozole  (patient is postmenopausal) started 04/13/2021 and anti-HER-2 therapy with neratinib -------------------------------------------------------------------------------------------------------------------------------- Current treatment: Initially anastrozole , switched to letrozole  07/06/2021   Letrozole  Toxicities: Fatigue Left shoulder arthroplasty 11/16/2022   05/23/2022: Progression of groundglass attenuation in the lungs possibly hypersensitivity pneumonitis, unchanged few small lung nodules stable borderline lymph nodes.   Breast cancer surveillance: Mammogram 10/03/2024: Benign breast density category B Breast exam 11/26/2024: Benign    Bone Health Last bone density scan 07/26/2024: T-score -2.5, left forearm T-score -5.3 (artifact)     Return to clinic in 1 year for follow-up     No orders of the defined types were placed in this  encounter.  The patient has a good understanding of the overall plan. she agrees with it. she will call with any problems that may develop before the next visit here.  I personally spent a total of 30 minutes in the care of the patient today including preparing to see the patient, getting/reviewing separately obtained history, performing a medically appropriate exam/evaluation, counseling and educating, placing orders, referring and communicating with other health care professionals, documenting clinical information in the EHR, independently interpreting results, communicating results, and coordinating care.   Viinay K Jevonte Clanton, MD 11/26/2024    "

## 2024-11-26 NOTE — Assessment & Plan Note (Signed)
 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  ER 80%, PR 60%, Ki-67 15%, HER-2 equivocal by IHC positive for fish 10/11/20: Re-excision: Neg   Recommendation: 1. Adjuvant chemotherapy with Taxol  Herceptin  followed by Herceptin  maintenance for 1 year completed November 2022 2. Adjuvant radiation therapy 02/10/21- 03/24/21 3. Adjuvant antiestrogen therapy with anastrozole  (patient is postmenopausal) started 04/13/2021 and anti-HER-2 therapy with neratinib -------------------------------------------------------------------------------------------------------------------------------- Current treatment: Initially anastrozole , switched to letrozole  07/06/2021   Letrozole  Toxicities: Fatigue Left shoulder arthroplasty 11/16/2022   05/23/2022: Progression of groundglass attenuation in the lungs possibly hypersensitivity pneumonitis, unchanged few small lung nodules stable borderline lymph nodes.   Breast cancer surveillance: Mammogram 10/03/2024: Benign breast density category B Breast exam 11/26/2024: Benign    Bone Health Last bone density scan 07/26/2024: T-score -2.5, left forearm T-score -5.3 (artifact)     Return to clinic in 1 year for follow-up

## 2024-12-04 ENCOUNTER — Encounter: Payer: Self-pay | Admitting: Registered Nurse

## 2024-12-04 ENCOUNTER — Encounter: Attending: Physical Medicine and Rehabilitation | Admitting: Registered Nurse

## 2024-12-04 VITALS — BP 111/78 | HR 88 | Ht 64.0 in | Wt 198.0 lb

## 2024-12-04 DIAGNOSIS — M542 Cervicalgia: Secondary | ICD-10-CM | POA: Diagnosis present

## 2024-12-04 DIAGNOSIS — Z5181 Encounter for therapeutic drug level monitoring: Secondary | ICD-10-CM | POA: Insufficient documentation

## 2024-12-04 DIAGNOSIS — Z79891 Long term (current) use of opiate analgesic: Secondary | ICD-10-CM | POA: Diagnosis present

## 2024-12-04 DIAGNOSIS — M5416 Radiculopathy, lumbar region: Secondary | ICD-10-CM | POA: Diagnosis present

## 2024-12-04 DIAGNOSIS — G894 Chronic pain syndrome: Secondary | ICD-10-CM | POA: Insufficient documentation

## 2024-12-04 MED ORDER — HYDROCODONE-ACETAMINOPHEN 5-325 MG PO TABS: 1.0000 | ORAL_TABLET | Freq: Three times a day (TID) | ORAL | 0 refills | Status: AC | PRN

## 2024-12-04 MED ORDER — HYDROCODONE-ACETAMINOPHEN 5-325 MG PO TABS: 1.0000 | ORAL_TABLET | Freq: Three times a day (TID) | ORAL | 0 refills | Status: DC | PRN

## 2024-12-04 NOTE — Progress Notes (Unsigned)
 "  Subjective:    Patient ID: Monica [name], female    DOB: [birth year], 53 y.o.   MRN: [id number]  HPI: Monica [name] is a 53 y.o. female who returns for follow up appointment for chronic pain and medication refill. states *** pain is located in  ***. rates pain ***. current exercise regime is walking and performing stretching exercises.  Ms. Sweigert Morphine equivalent is *** MME.   Last Oral Swab was Performed on 10/01/2024, it was consistent.      Pain Inventory Average Pain 9 Pain Right Now 9 My pain is intermittent, constant, sharp, tingling, aching, and numbness  In the last 24 hours, has pain interfered with the following? General activity 5 Relation with others 3 Enjoyment of life 4 What TIME of day is your pain at its worst? morning , daytime, evening, and night Sleep (in general) Fair  Pain is worse with: walking, sitting, standing, and some activites Pain improves with: heat/ice and medication Relief from Meds: 9  Family History  Problem Relation Age of Onset   Hypertension Father    Hypertension Sister    Hypertension Maternal Grandmother    Breast cancer Maternal Aunt 81   Breast cancer Paternal Aunt        dx at unknown age   Breast cancer Other        MGM's niece; dx 41s   Breast cancer Other        MGF's niece   Social History   Socioeconomic History   Marital status: Single    Spouse name: Not on file   Number of children: Not on file   Years of education: Not on file   Highest education level: Not on file  Occupational History   Not on file  Tobacco Use   Smoking status: Former    Current packs/day: 0.00    Average packs/day: 0.5 packs/day for 35.0 years (17.5 ttl pk-yrs)    Types: Cigarettes    Start date: 08/22/1985    Quit date: 08/22/2020    Years since quitting: 4.2   Smokeless tobacco: Never  Vaping Use   Vaping status: Never Used  Substance and Sexual Activity   Alcohol use: No   Drug use: Never   Sexual  activity: Not Currently    Birth control/protection: Surgical  Other Topics Concern   Not on file  Social History Narrative   Not on file   Social Drivers of Health   Tobacco Use: Medium Risk (10/12/2024)   Patient History    Smoking Tobacco Use: Former    Smokeless Tobacco Use: Never    Passive Exposure: Not on Actuary Strain: Low Risk (05/23/2024)   Overall Financial Resource Strain (CARDIA)    Difficulty of Paying Living Expenses: Not hard at all  Food Insecurity: No Food Insecurity (05/23/2024)   Epic    Worried About Programme Researcher, Broadcasting/film/video in the Last Year: Never true    Ran Out of Food in the Last Year: Never true  Transportation Needs: No Transportation Needs (05/23/2024)   Epic    Lack of Transportation (Medical): No    Lack of Transportation (Non-Medical): No  Physical Activity: Inactive (05/23/2024)   Exercise Vital Sign    Days of Exercise per Week: 0 days    Minutes of Exercise per Session: 0 min  Stress: Stress Concern Present (05/23/2024)   [employer] of Occupational Health - Occupational Stress Questionnaire    Feeling of Stress: Rather much  Social Connections: Moderately Isolated (05/23/2024)   Social Connection and Isolation Panel    Frequency of Communication with Friends and Family: More than three times a week    Frequency of Social Gatherings with Friends and Family: Twice a week    Attends Religious Services: 1 to 4 times per year    Active Member of Clubs or Organizations: No    Attends Banker Meetings: Never    Marital Status: Never married  Depression (PHQ2-9): Medium Risk (10/12/2024)   Depression (PHQ2-9)    PHQ-2 Score: 10  Alcohol Screen: Low Risk (05/23/2024)   Alcohol Screen    Last Alcohol Screening Score (AUDIT): 0  Housing: Low Risk (05/23/2024)   Epic    Unable to Pay for Housing in the Last Year: No    Number of Times Moved in the Last Year: 0    Homeless in the Last Year: No  Utilities: Not At Risk  (05/23/2024)   Epic    Threatened with loss of utilities: No  Health Literacy: Adequate Health Literacy (05/23/2024)   B1300 Health Literacy    Frequency of need for help with medical instructions: Never   Past Surgical History:  Procedure Laterality Date   ABDOMINAL HYSTERECTOMY     APPENDECTOMY     BREAST LUMPECTOMY WITH RADIOACTIVE SEED AND SENTINEL LYMPH NODE BIOPSY Right 09/10/2020   Procedure: RIGHT BREAST LUMPECTOMY WITH RADIOACTIVE SEED AND SENTINEL LYMPH NODE MAPPING;  Surgeon: Vanderbilt Ned, MD;  Location: [facility];  Service: General;  Laterality: Right;   COLONOSCOPY WITH PROPOFOL  N/A 10/21/2023   Procedure: COLONOSCOPY WITH PROPOFOL ;  Surgeon: Cindie Carlin POUR, DO;  [facility];  Service: Endoscopy;  Laterality: N/A;  11:00am, asa 3   POLYPECTOMY  10/21/2023   Procedure: POLYPECTOMY INTESTINAL;  Surgeon: Cindie Carlin POUR, DO;  [facility];  Service: Endoscopy;;   PORTACATH PLACEMENT Right 10/16/2020   Procedure: INSERTION PORT-A-CATH WITH ULTRASOUND GUIDANCE;  Surgeon: Vanderbilt Ned, MD;  Location: [facility];  Service: General;  Laterality: Right;   RE-EXCISION OF BREAST LUMPECTOMY Right 10/16/2020   Procedure: RE-EXCISION OF RIGHT BREAST LUMPECTOMY;  Surgeon: Vanderbilt Ned, MD;  Location: [facility];  Service: General;  Laterality: Right;   REVERSE SHOULDER ARTHROPLASTY Left 11/16/2022   Procedure: LEFT REVERSE SHOULDER ARTHROPLASTY;  Surgeon: Genelle Standing, MD;  Location: [facility];  Service: Orthopedics;  Laterality: Left;   Past Surgical History:  Procedure Laterality Date   ABDOMINAL HYSTERECTOMY     APPENDECTOMY     BREAST LUMPECTOMY WITH RADIOACTIVE SEED AND SENTINEL LYMPH NODE BIOPSY Right 09/10/2020   Procedure: RIGHT BREAST LUMPECTOMY WITH RADIOACTIVE SEED AND SENTINEL LYMPH NODE MAPPING;  Surgeon: Vanderbilt Ned, MD;  Location: [facility];  Service:  General;  Laterality: Right;   COLONOSCOPY WITH PROPOFOL  N/A 10/21/2023   Procedure: COLONOSCOPY WITH PROPOFOL ;  Surgeon: Cindie Carlin POUR, DO;  [facility];  Service: Endoscopy;  Laterality: N/A;  11:00am, asa 3   POLYPECTOMY  10/21/2023   Procedure: POLYPECTOMY INTESTINAL;  Surgeon: Cindie Carlin POUR, DO;  [facility];  Service: Endoscopy;;   PORTACATH PLACEMENT Right 10/16/2020   Procedure: INSERTION PORT-A-CATH WITH ULTRASOUND GUIDANCE;  Surgeon: Vanderbilt Ned, MD;  Location: [facility];  Service: General;  Laterality: Right;   RE-EXCISION OF BREAST LUMPECTOMY Right 10/16/2020   Procedure: RE-EXCISION OF RIGHT BREAST LUMPECTOMY;  Surgeon: Vanderbilt Ned, MD;  Location: [facility];  Service: General;  Laterality: Right;   REVERSE SHOULDER ARTHROPLASTY Left 11/16/2022   Procedure: LEFT REVERSE SHOULDER ARTHROPLASTY;  Surgeon: Genelle Standing, MD;  Location: [facility];  Service: Orthopedics;  Laterality: Left;   Past Medical History:  Diagnosis Date   Arthritis    Asthma    Back pain    Breast cancer (HCC)    Buttock pain    Dyspnea    on exertion   Family history of breast cancer 09/22/2020   Hypertension    Multiple lung nodules on CT 2022   Personal history of chemotherapy    Personal history of radiation therapy    There were no vitals taken for this visit.  Opioid Risk Score:   Fall Risk Score:  `1  Depression screen [facility] 2/9     10/12/2024    9:22 AM 08/16/2024   10:05 AM 06/29/2024    9:02 AM 06/11/2024    9:10 AM 05/23/2024    9:08 AM 02/08/2024    9:34 AM 09/30/2023   10:14 AM  Depression screen PHQ 2/9  Decreased Interest 2 2 1 2 2 1  0  Down, Depressed, Hopeless 2 1 1 2 2 1  0  PHQ - 2 Score 4 3 2 4 4 2  0  Altered sleeping 2 1  2 2   0  Tired, decreased energy 2 2  3 3   0  Change in appetite 0 0  0 0  0  Feeling bad or failure about yourself  2 0  0 0  0  Trouble concentrating 0 1  0 0  0   Moving slowly or fidgety/restless 0 1  0 0  0  Suicidal thoughts 0 0  0 0  0  PHQ-9 Score 10 8   9  9    0   Difficult doing work/chores Not difficult at all Not difficult at all  Somewhat difficult   Not difficult at all     Data saved with a previous flowsheet row definition    Review of Systems  Musculoskeletal:        Pain in the : left shoulder down to left hand, left hip down to left toes  All other systems reviewed and are negative.      Objective:   Physical Exam        Assessment & Plan:  Left Lumbar Radiculitis: Gabapentin  was discontinued due to being ineffective. Continue HEP as Tolerated. Continue to Monitor.  Left Greater Trochanter Tenderness: Scheduled for Hip Injection with Dr Emeline. Continue HEP as Tolerated. Continue to Monitor.  Chronic Pain Syndrome: Continue Hydrocodone  5/325 mg one tablet three times a day as needed for pain. Ms. Corporan reports she uses her Hydrocodone  sparingly. We will continue the opioid monitoring program, this consists of regular clinic visits, examinations, urine drug screen, pill counts as well as use of [place]  Controlled Substance Reporting system. A 12 month History has been reviewed on the [place]  Controlled Substance Reporting System on 02/08/2024. She will call office when she needs a refill. She verbalizes understanding.    F/U in 2 months   "

## 2024-12-11 ENCOUNTER — Encounter: Payer: Self-pay | Admitting: Hematology and Oncology

## 2024-12-27 ENCOUNTER — Ambulatory Visit: Admitting: Physical Medicine and Rehabilitation

## 2024-12-27 ENCOUNTER — Other Ambulatory Visit: Payer: Self-pay

## 2024-12-27 VITALS — BP 132/86 | HR 76

## 2024-12-27 DIAGNOSIS — M5412 Radiculopathy, cervical region: Secondary | ICD-10-CM

## 2024-12-27 MED ORDER — METHYLPREDNISOLONE ACETATE 40 MG/ML IJ SUSP
40.0000 mg | Freq: Once | INTRAMUSCULAR | Status: AC
  Administered 2024-12-27: 40 mg

## 2024-12-27 NOTE — Progress Notes (Signed)
 Pain Scale   Average Pain 7 Patient advising she has chronic neck pain that radiates to bilateral sides.        +Driver, -BT, -Dye Allergies.

## 2024-12-28 ENCOUNTER — Encounter: Admitting: Physical Medicine & Rehabilitation

## 2025-01-01 NOTE — Procedures (Signed)
 Cervical Epidural Steroid Injection - Interlaminar Approach with Fluoroscopic Guidance  Patient: Monica [name]      Date of Birth: Mar 15, 1971 MRN: [id number] PCP: Edman Meade PEDLAR, FNP      Visit Date: 12/27/2024   Universal Protocol:    Date/Time: 01/01/2609:17 AM  Consent Given By: the patient  Position: PRONE  Additional Comments: Vital signs were monitored before and after the procedure. Patient was prepped and draped in the usual sterile fashion. The correct patient, procedure, and site was verified.   Injection Procedure Details:   Procedure diagnoses: Cervical radiculopathy [M54.12]    Meds Administered:  Meds ordered this encounter  Medications   methylPREDNISolone  acetate (DEPO-MEDROL ) injection 40 mg     Laterality: Left  Location/Site: C7-T1  Needle: 3.5 in., 20 ga. Tuohy  Needle Placement: Paramedian epidural space  Findings:  -Comments: Excellent flow of contrast into the epidural space.  Procedure Details: Using a paramedian approach from the side mentioned above, the region overlying the inferior lamina was localized under fluoroscopic visualization and the soft tissues overlying this structure were infiltrated with 4 ml. of 1% Lidocaine  without Epinephrine . A # 20 gauge, Tuohy needle was inserted into the epidural space using a paramedian approach.  The epidural space was localized using loss of resistance along with contralateral oblique bi-planar fluoroscopic views.  After negative aspirate for air, blood, and CSF, a 2 ml. volume of Isovue -250 was injected into the epidural space and the flow of contrast was observed. Radiographs were obtained for documentation purposes.   The injectate was administered into the level noted above.  Additional Comments:  The patient tolerated the procedure well Dressing: 2 x 2 sterile gauze and Band-Aid    Post-procedure details: Patient was observed during the procedure. Post-procedure instructions  were reviewed.  Patient left the clinic in stable condition.

## 2025-01-01 NOTE — Progress Notes (Signed)
 "  Monica [name] - 54 y.o. female MRN [id number]  Date of birth: 1970-12-10  Office Visit Note: Visit Date: 12/27/2024 PCP: Edman Meade PEDLAR, FNP Referred by: Edman Meade PEDLAR, FNP  Subjective: Chief Complaint  Patient presents with   Neck - Pain   HPI:  Monica [name] is a 54 y.o. female who comes in today at the request of Duwaine Pouch, FNP for planned Left C7-T1 Cervical Interlaminar epidural steroid injection with fluoroscopic guidance.  The patient has failed conservative care including home exercise, medications, time and activity modification.  This injection will be diagnostic and hopefully therapeutic.  Please see requesting physician notes for further details and justification.   ROS Otherwise per HPI.  Assessment & Plan: Visit Diagnoses:    ICD-10-CM   1. Cervical radiculopathy  M54.12 XR C-ARM NO REPORT    Epidural Steroid injection    methylPREDNISolone  acetate (DEPO-MEDROL ) injection 40 mg      Plan: No additional findings.   Meds & Orders:  Meds ordered this encounter  Medications   methylPREDNISolone  acetate (DEPO-MEDROL ) injection 40 mg    Orders Placed This Encounter  Procedures   XR C-ARM NO REPORT   Epidural Steroid injection    Follow-up: Return for visit to requesting provider as needed.   Procedures: No procedures performed  Cervical Epidural Steroid Injection - Interlaminar Approach with Fluoroscopic Guidance  Patient: Monica [name]      Date of Birth: Apr 22, 1971 MRN: [id number] PCP: Edman Meade PEDLAR, FNP      Visit Date: 12/27/2024   Universal Protocol:    Date/Time: 01/01/2609:17 AM  Consent Given By: the patient  Position: PRONE  Additional Comments: Vital signs were monitored before and after the procedure. Patient was prepped and draped in the usual sterile fashion. The correct patient, procedure, and site was verified.   Injection Procedure Details:   Procedure diagnoses: Cervical  radiculopathy [M54.12]    Meds Administered:  Meds ordered this encounter  Medications   methylPREDNISolone  acetate (DEPO-MEDROL ) injection 40 mg     Laterality: Left  Location/Site: C7-T1  Needle: 3.5 in., 20 ga. Tuohy  Needle Placement: Paramedian epidural space  Findings:  -Comments: Excellent flow of contrast into the epidural space.  Procedure Details: Using a paramedian approach from the side mentioned above, the region overlying the inferior lamina was localized under fluoroscopic visualization and the soft tissues overlying this structure were infiltrated with 4 ml. of 1% Lidocaine  without Epinephrine . A # 20 gauge, Tuohy needle was inserted into the epidural space using a paramedian approach.  The epidural space was localized using loss of resistance along with contralateral oblique bi-planar fluoroscopic views.  After negative aspirate for air, blood, and CSF, a 2 ml. volume of Isovue -250 was injected into the epidural space and the flow of contrast was observed. Radiographs were obtained for documentation purposes.   The injectate was administered into the level noted above.  Additional Comments:  The patient tolerated the procedure well Dressing: 2 x 2 sterile gauze and Band-Aid    Post-procedure details: Patient was observed during the procedure. Post-procedure instructions were reviewed.  Patient left the clinic in stable condition.   Clinical History: MRI CERVICAL SPINE WITHOUT CONTRAST   TECHNIQUE: Multiplanar, multisequence MR imaging of the cervical spine was performed. No intravenous contrast was administered.   COMPARISON:  Prior radiograph from 06/03/2022.   FINDINGS: Alignment: Straightening with mild reversal of the normal cervical lordosis. No listhesis.   Vertebrae: Vertebral body height maintained without acute  or chronic fracture. Bone marrow signal intensity within normal limits. No discrete or worrisome osseous lesions. No abnormal  marrow edema.   Cord: Normal signal and morphology.   Posterior Fossa, vertebral arteries, paraspinal tissues: Unremarkable.   Disc levels:   C2-C3: Minimal annular disc bulge with uncovertebral spurring. No spinal stenosis. Foramina remain patent.   C3-C4: Mild disc bulge with right worse than left uncovertebral spurring. Flattening and partial effacement of the ventral thecal sac, slightly asymmetric to the right. Mild cord flattening without cord signal changes. Mild spinal stenosis. Moderate right C4 foraminal narrowing. Left neural foramina remains patent.   C4-C5: Disc bulge with bilateral uncovertebral spurring. Flattening and effacement of the ventral thecal sac with mild cord flattening but no cord signal changes. Mild spinal stenosis. Moderate right worse than left C5 foraminal narrowing.   C5-C6: Mild disc bulge with right-sided uncovertebral spurring. Flattening and partial effacement of the ventral thecal sac with resultant mild spinal stenosis. Mild right C6 foraminal narrowing. Left neural foramina remains patent.   C6-C7: Mild disc bulge with uncovertebral spurring. Flattening and partial effacement of the ventral thecal sac, slightly asymmetric to the left. Mild spinal stenosis. Mild left C7 foraminal narrowing. Right neural foramen remains patent.   C7-T1: Normal interspace. Mild left-sided facet hypertrophy. No spinal stenosis. Mild left C8 foraminal narrowing. Right neural foramen remains patent.   IMPRESSION: 1. Multilevel cervical spondylosis with resultant mild diffuse spinal stenosis at C3-4 through C6-7. 2. Multifactorial degenerative changes with resultant multilevel foraminal narrowing as above. Notable findings include moderate right C4 and bilateral C5 foraminal stenosis, with mild right C6 and left C7 foraminal narrowing.     Electronically Signed   By: Morene Hoard M.D.   On: 02/28/2023 03:59     Objective:  VS:  HT:    WT:    BMI:     BP:132/86  HR:76bpm  TEMP: ( )  RESP:  Physical Exam Vitals and nursing note reviewed.  Constitutional:      General: She is not in acute distress.    Appearance: Normal appearance. She is not ill-appearing.  HENT:     Head: Normocephalic and atraumatic.     Right Ear: External ear normal.     Left Ear: External ear normal.  Eyes:     Extraocular Movements: Extraocular movements intact.  Cardiovascular:     Rate and Rhythm: Normal rate.     Pulses: Normal pulses.  Musculoskeletal:     Cervical back: Tenderness present. No rigidity.     Right lower leg: No edema.     Left lower leg: No edema.     Comments: Patient has good strength in the upper extremities including 5 out of 5 strength in wrist extension long finger flexion and APB.  There is no atrophy of the hands intrinsically.  There is a negative Hoffmann's test.   Lymphadenopathy:     Cervical: No cervical adenopathy.  Skin:    Findings: No erythema, lesion or rash.  Neurological:     General: No focal deficit present.     Mental Status: She is alert and oriented to person, place, and time.     Sensory: No sensory deficit.     Motor: No weakness or abnormal muscle tone.     Coordination: Coordination normal.  Psychiatric:        Mood and Affect: Mood normal.        Behavior: Behavior normal.      Imaging: No results found. "

## 2025-02-05 ENCOUNTER — Encounter: Admitting: Registered Nurse

## 2025-02-11 ENCOUNTER — Ambulatory Visit: Admitting: Family Medicine

## 2025-03-28 ENCOUNTER — Ambulatory Visit: Admitting: Pulmonary Disease

## 2025-05-27 ENCOUNTER — Ambulatory Visit

## 2025-05-28 ENCOUNTER — Ambulatory Visit

## 2025-11-26 ENCOUNTER — Inpatient Hospital Stay: Admitting: Hematology and Oncology
